# Patient Record
Sex: Male | Born: 1950
Health system: Southern US, Community
[De-identification: ages and names within clinical notes are randomized; demographics above are authoritative.]

## PROBLEM LIST (undated history)

## (undated) DIAGNOSIS — I2510 Atherosclerotic heart disease of native coronary artery without angina pectoris: Secondary | ICD-10-CM

## (undated) DIAGNOSIS — R0600 Dyspnea, unspecified: Secondary | ICD-10-CM

## (undated) DIAGNOSIS — I10 Essential (primary) hypertension: Secondary | ICD-10-CM

## (undated) DIAGNOSIS — T4145XA Adverse effect of unspecified anesthetic, initial encounter: Secondary | ICD-10-CM

## (undated) DIAGNOSIS — I639 Cerebral infarction, unspecified: Secondary | ICD-10-CM

## (undated) DIAGNOSIS — C61 Malignant neoplasm of prostate: Secondary | ICD-10-CM

## (undated) DIAGNOSIS — T8859XA Other complications of anesthesia, initial encounter: Secondary | ICD-10-CM

## (undated) DIAGNOSIS — G459 Transient cerebral ischemic attack, unspecified: Secondary | ICD-10-CM

## (undated) DIAGNOSIS — Z95820 Peripheral vascular angioplasty status with implants and grafts: Secondary | ICD-10-CM

## (undated) DIAGNOSIS — G458 Other transient cerebral ischemic attacks and related syndromes: Secondary | ICD-10-CM

## (undated) DIAGNOSIS — C029 Malignant neoplasm of tongue, unspecified: Secondary | ICD-10-CM

## (undated) DIAGNOSIS — B199 Unspecified viral hepatitis without hepatic coma: Secondary | ICD-10-CM

## (undated) DIAGNOSIS — F419 Anxiety disorder, unspecified: Secondary | ICD-10-CM

## (undated) DIAGNOSIS — K219 Gastro-esophageal reflux disease without esophagitis: Secondary | ICD-10-CM

## (undated) DIAGNOSIS — J449 Chronic obstructive pulmonary disease, unspecified: Secondary | ICD-10-CM

## (undated) DIAGNOSIS — F329 Major depressive disorder, single episode, unspecified: Secondary | ICD-10-CM

## (undated) DIAGNOSIS — E785 Hyperlipidemia, unspecified: Secondary | ICD-10-CM

## (undated) DIAGNOSIS — F909 Attention-deficit hyperactivity disorder, unspecified type: Secondary | ICD-10-CM

## (undated) DIAGNOSIS — M1990 Unspecified osteoarthritis, unspecified site: Secondary | ICD-10-CM

## (undated) DIAGNOSIS — G43909 Migraine, unspecified, not intractable, without status migrainosus: Secondary | ICD-10-CM

## (undated) DIAGNOSIS — E039 Hypothyroidism, unspecified: Secondary | ICD-10-CM

## (undated) DIAGNOSIS — I6529 Occlusion and stenosis of unspecified carotid artery: Secondary | ICD-10-CM

## (undated) DIAGNOSIS — I739 Peripheral vascular disease, unspecified: Secondary | ICD-10-CM

## (undated) HISTORY — PX: BIOPSY TONGUE: PRO39

## (undated) HISTORY — DX: Occlusion and stenosis of unspecified carotid artery: I65.29

## (undated) HISTORY — DX: Essential (primary) hypertension: I10

## (undated) HISTORY — DX: Atherosclerotic heart disease of native coronary artery without angina pectoris: I25.10

## (undated) HISTORY — PX: BACK SURGERY: SHX140

## (undated) HISTORY — DX: Other transient cerebral ischemic attacks and related syndromes: G45.8

## (undated) HISTORY — PX: CORONARY ANGIOPLASTY WITH STENT PLACEMENT: SHX49

## (undated) HISTORY — DX: Malignant neoplasm of tongue, unspecified: C02.9

## (undated) HISTORY — DX: Hyperlipidemia, unspecified: E78.5

## (undated) HISTORY — DX: Chronic obstructive pulmonary disease, unspecified: J44.9

---
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
## 1998-10-01 HISTORY — PX: CERVICAL DISC SURGERY: SHX588

## 2000-03-15 ENCOUNTER — Ambulatory Visit (HOSPITAL_COMMUNITY): Admission: RE | Admit: 2000-03-15 | Discharge: 2000-03-15 | Payer: Self-pay | Admitting: Gastroenterology

## 2000-03-15 ENCOUNTER — Emergency Department (HOSPITAL_COMMUNITY): Admission: EM | Admit: 2000-03-15 | Discharge: 2000-03-15 | Payer: Self-pay | Admitting: Emergency Medicine

## 2000-07-19 ENCOUNTER — Encounter: Payer: Self-pay | Admitting: Neurosurgery

## 2000-07-23 ENCOUNTER — Encounter: Payer: Self-pay | Admitting: Neurosurgery

## 2000-07-24 ENCOUNTER — Inpatient Hospital Stay (HOSPITAL_COMMUNITY): Admission: RE | Admit: 2000-07-24 | Discharge: 2000-07-25 | Payer: Self-pay | Admitting: Neurosurgery

## 2001-06-13 ENCOUNTER — Ambulatory Visit (HOSPITAL_COMMUNITY): Admission: RE | Admit: 2001-06-13 | Discharge: 2001-06-13 | Payer: Self-pay | Admitting: Neurosurgery

## 2001-06-13 ENCOUNTER — Encounter: Payer: Self-pay | Admitting: Neurosurgery

## 2003-10-02 DIAGNOSIS — G459 Transient cerebral ischemic attack, unspecified: Secondary | ICD-10-CM

## 2003-10-02 HISTORY — DX: Transient cerebral ischemic attack, unspecified: G45.9

## 2003-12-22 ENCOUNTER — Ambulatory Visit (HOSPITAL_COMMUNITY): Admission: RE | Admit: 2003-12-22 | Discharge: 2003-12-22 | Payer: Self-pay | Admitting: *Deleted

## 2004-01-06 ENCOUNTER — Encounter: Admission: EM | Admit: 2004-01-06 | Discharge: 2004-01-06 | Payer: Self-pay | Admitting: Dentistry

## 2004-03-29 ENCOUNTER — Encounter: Payer: Self-pay | Admitting: Emergency Medicine

## 2004-03-29 ENCOUNTER — Inpatient Hospital Stay (HOSPITAL_COMMUNITY): Admission: EM | Admit: 2004-03-29 | Discharge: 2004-03-31 | Payer: Self-pay | Admitting: Neurology

## 2004-03-30 ENCOUNTER — Encounter (INDEPENDENT_AMBULATORY_CARE_PROVIDER_SITE_OTHER): Payer: Self-pay | Admitting: Cardiology

## 2005-01-09 ENCOUNTER — Ambulatory Visit: Payer: Self-pay | Admitting: Dentistry

## 2005-06-29 ENCOUNTER — Ambulatory Visit (HOSPITAL_COMMUNITY): Admission: RE | Admit: 2005-06-29 | Discharge: 2005-06-29 | Payer: Self-pay | Admitting: Gastroenterology

## 2005-10-25 ENCOUNTER — Ambulatory Visit: Admission: RE | Admit: 2005-10-25 | Discharge: 2005-11-08 | Payer: Self-pay | Admitting: Radiation Oncology

## 2005-10-29 ENCOUNTER — Ambulatory Visit (HOSPITAL_COMMUNITY): Admission: RE | Admit: 2005-10-29 | Discharge: 2005-10-29 | Payer: Self-pay | Admitting: Gastroenterology

## 2005-11-02 ENCOUNTER — Ambulatory Visit: Admission: RE | Admit: 2005-11-02 | Discharge: 2005-11-02 | Payer: Self-pay | Admitting: Radiation Oncology

## 2005-11-30 ENCOUNTER — Ambulatory Visit: Payer: Self-pay | Admitting: Dentistry

## 2006-03-21 ENCOUNTER — Ambulatory Visit (HOSPITAL_COMMUNITY): Admission: RE | Admit: 2006-03-21 | Discharge: 2006-03-21 | Payer: Self-pay | Admitting: Radiation Oncology

## 2006-03-22 ENCOUNTER — Ambulatory Visit: Admission: RE | Admit: 2006-03-22 | Discharge: 2006-03-27 | Payer: Self-pay | Admitting: Radiation Oncology

## 2006-03-22 ENCOUNTER — Ambulatory Visit (HOSPITAL_COMMUNITY): Admission: RE | Admit: 2006-03-22 | Discharge: 2006-03-22 | Payer: Self-pay | Admitting: Radiation Oncology

## 2006-03-22 LAB — BASIC METABOLIC PANEL
BUN: 9 mg/dL (ref 6–23)
CO2: 30 mEq/L (ref 19–32)
Calcium: 9.5 mg/dL (ref 8.4–10.5)
Chloride: 99 mEq/L (ref 96–112)
Creatinine, Ser: 1.1 mg/dL (ref 0.40–1.50)
Glucose, Bld: 115 mg/dL — ABNORMAL HIGH (ref 70–99)
Potassium: 4 mEq/L (ref 3.5–5.3)
Sodium: 135 mEq/L (ref 135–145)

## 2006-06-21 ENCOUNTER — Ambulatory Visit: Payer: Self-pay | Admitting: Dentistry

## 2006-12-20 ENCOUNTER — Ambulatory Visit: Payer: Self-pay | Admitting: Dentistry

## 2006-12-26 ENCOUNTER — Ambulatory Visit: Payer: Self-pay | Admitting: *Deleted

## 2007-07-18 ENCOUNTER — Ambulatory Visit: Payer: Self-pay | Admitting: Dentistry

## 2007-10-02 HISTORY — PX: CORONARY ARTERY BYPASS GRAFT: SHX141

## 2007-12-25 ENCOUNTER — Ambulatory Visit: Payer: Self-pay | Admitting: *Deleted

## 2008-01-22 ENCOUNTER — Ambulatory Visit: Payer: Self-pay | Admitting: *Deleted

## 2008-02-11 ENCOUNTER — Ambulatory Visit (HOSPITAL_COMMUNITY): Admission: RE | Admit: 2008-02-11 | Discharge: 2008-02-11 | Payer: Self-pay | Admitting: Neurology

## 2008-03-11 ENCOUNTER — Ambulatory Visit: Payer: Self-pay | Admitting: *Deleted

## 2008-04-16 DIAGNOSIS — I10 Essential (primary) hypertension: Secondary | ICD-10-CM

## 2008-04-16 HISTORY — DX: Essential (primary) hypertension: I10

## 2008-04-21 ENCOUNTER — Encounter: Admission: RE | Admit: 2008-04-21 | Discharge: 2008-04-21 | Payer: Self-pay | Admitting: Cardiovascular Disease

## 2008-04-26 ENCOUNTER — Ambulatory Visit (HOSPITAL_COMMUNITY): Admission: RE | Admit: 2008-04-26 | Discharge: 2008-04-26 | Payer: Self-pay | Admitting: Cardiovascular Disease

## 2008-04-26 ENCOUNTER — Ambulatory Visit: Payer: Self-pay | Admitting: Thoracic Surgery (Cardiothoracic Vascular Surgery)

## 2008-04-26 ENCOUNTER — Encounter: Payer: Self-pay | Admitting: Thoracic Surgery (Cardiothoracic Vascular Surgery)

## 2008-04-26 HISTORY — PX: CARDIAC CATHETERIZATION: SHX172

## 2008-04-29 ENCOUNTER — Ambulatory Visit: Payer: Self-pay | Admitting: *Deleted

## 2008-05-05 ENCOUNTER — Ambulatory Visit (HOSPITAL_COMMUNITY): Admission: RE | Admit: 2008-05-05 | Discharge: 2008-05-05 | Payer: Self-pay | Admitting: *Deleted

## 2008-06-08 ENCOUNTER — Ambulatory Visit: Payer: Self-pay | Admitting: Thoracic Surgery (Cardiothoracic Vascular Surgery)

## 2008-06-14 ENCOUNTER — Ambulatory Visit (HOSPITAL_COMMUNITY)
Admission: RE | Admit: 2008-06-14 | Discharge: 2008-06-14 | Payer: Self-pay | Admitting: Thoracic Surgery (Cardiothoracic Vascular Surgery)

## 2008-06-14 ENCOUNTER — Encounter: Payer: Self-pay | Admitting: Thoracic Surgery (Cardiothoracic Vascular Surgery)

## 2008-06-16 ENCOUNTER — Inpatient Hospital Stay (HOSPITAL_COMMUNITY)
Admission: RE | Admit: 2008-06-16 | Discharge: 2008-06-20 | Payer: Self-pay | Admitting: Thoracic Surgery (Cardiothoracic Vascular Surgery)

## 2008-06-16 ENCOUNTER — Ambulatory Visit: Payer: Self-pay | Admitting: Thoracic Surgery (Cardiothoracic Vascular Surgery)

## 2008-06-24 ENCOUNTER — Ambulatory Visit: Payer: Self-pay | Admitting: Thoracic Surgery (Cardiothoracic Vascular Surgery)

## 2008-06-24 ENCOUNTER — Encounter
Admission: RE | Admit: 2008-06-24 | Discharge: 2008-06-24 | Payer: Self-pay | Admitting: Thoracic Surgery (Cardiothoracic Vascular Surgery)

## 2008-06-28 ENCOUNTER — Inpatient Hospital Stay (HOSPITAL_COMMUNITY)
Admission: AD | Admit: 2008-06-28 | Discharge: 2008-06-30 | Payer: Self-pay | Admitting: Thoracic Surgery (Cardiothoracic Vascular Surgery)

## 2008-06-28 ENCOUNTER — Ambulatory Visit: Payer: Self-pay | Admitting: Thoracic Surgery (Cardiothoracic Vascular Surgery)

## 2008-06-29 ENCOUNTER — Encounter (INDEPENDENT_AMBULATORY_CARE_PROVIDER_SITE_OTHER): Payer: Self-pay | Admitting: Gastroenterology

## 2008-07-08 ENCOUNTER — Encounter: Admission: RE | Admit: 2008-07-08 | Discharge: 2008-07-08 | Payer: Self-pay | Admitting: Gastroenterology

## 2008-07-08 ENCOUNTER — Ambulatory Visit: Payer: Self-pay | Admitting: Thoracic Surgery (Cardiothoracic Vascular Surgery)

## 2008-07-15 ENCOUNTER — Ambulatory Visit: Payer: Self-pay | Admitting: Thoracic Surgery (Cardiothoracic Vascular Surgery)

## 2008-07-15 ENCOUNTER — Encounter
Admission: RE | Admit: 2008-07-15 | Discharge: 2008-07-15 | Payer: Self-pay | Admitting: Thoracic Surgery (Cardiothoracic Vascular Surgery)

## 2008-07-26 ENCOUNTER — Ambulatory Visit (HOSPITAL_COMMUNITY): Admission: RE | Admit: 2008-07-26 | Discharge: 2008-07-26 | Payer: Self-pay | Admitting: Otolaryngology

## 2008-08-06 ENCOUNTER — Ambulatory Visit: Payer: Self-pay | Admitting: Thoracic Surgery (Cardiothoracic Vascular Surgery)

## 2008-08-19 ENCOUNTER — Encounter (HOSPITAL_COMMUNITY): Admission: RE | Admit: 2008-08-19 | Discharge: 2008-09-17 | Payer: Self-pay | Admitting: Cardiovascular Disease

## 2008-11-04 ENCOUNTER — Ambulatory Visit: Payer: Self-pay | Admitting: *Deleted

## 2009-05-04 ENCOUNTER — Encounter: Admission: RE | Admit: 2009-05-04 | Discharge: 2009-05-04 | Payer: Self-pay | Admitting: Emergency Medicine

## 2009-05-13 ENCOUNTER — Encounter: Admission: RE | Admit: 2009-05-13 | Discharge: 2009-05-13 | Payer: Self-pay | Admitting: Emergency Medicine

## 2009-05-26 ENCOUNTER — Ambulatory Visit: Payer: Self-pay | Admitting: *Deleted

## 2009-11-02 ENCOUNTER — Ambulatory Visit: Payer: Self-pay | Admitting: Vascular Surgery

## 2009-12-19 ENCOUNTER — Ambulatory Visit: Payer: Self-pay | Admitting: Internal Medicine

## 2010-01-02 ENCOUNTER — Emergency Department (HOSPITAL_COMMUNITY): Admission: EM | Admit: 2010-01-02 | Discharge: 2010-01-02 | Payer: Self-pay | Admitting: Emergency Medicine

## 2010-01-13 ENCOUNTER — Ambulatory Visit: Payer: Self-pay | Admitting: Internal Medicine

## 2010-06-13 ENCOUNTER — Ambulatory Visit: Payer: Self-pay | Admitting: Vascular Surgery

## 2010-07-25 ENCOUNTER — Ambulatory Visit: Payer: Self-pay | Admitting: Internal Medicine

## 2010-12-20 LAB — BASIC METABOLIC PANEL
BUN: 13 mg/dL (ref 6–23)
CO2: 25 mEq/L (ref 19–32)
Chloride: 106 mEq/L (ref 96–112)
Glucose, Bld: 130 mg/dL — ABNORMAL HIGH (ref 70–99)
Potassium: 3.6 mEq/L (ref 3.5–5.1)
Sodium: 138 mEq/L (ref 135–145)

## 2010-12-20 LAB — CBC
HCT: 45.9 % (ref 39.0–52.0)
Hemoglobin: 16 g/dL (ref 13.0–17.0)
MCV: 91.7 fL (ref 78.0–100.0)
Platelets: 190 10*3/uL (ref 150–400)
RDW: 14 % (ref 11.5–15.5)

## 2010-12-20 LAB — DIFFERENTIAL
Basophils Relative: 1 % (ref 0–1)
Monocytes Absolute: 0.4 10*3/uL (ref 0.1–1.0)

## 2010-12-25 ENCOUNTER — Encounter (INDEPENDENT_AMBULATORY_CARE_PROVIDER_SITE_OTHER): Payer: Commercial Managed Care - PPO | Admitting: Internal Medicine

## 2010-12-25 DIAGNOSIS — E785 Hyperlipidemia, unspecified: Secondary | ICD-10-CM

## 2010-12-25 DIAGNOSIS — Z23 Encounter for immunization: Secondary | ICD-10-CM

## 2010-12-25 DIAGNOSIS — F411 Generalized anxiety disorder: Secondary | ICD-10-CM

## 2010-12-25 DIAGNOSIS — I10 Essential (primary) hypertension: Secondary | ICD-10-CM

## 2010-12-25 DIAGNOSIS — F329 Major depressive disorder, single episode, unspecified: Secondary | ICD-10-CM

## 2011-01-01 ENCOUNTER — Other Ambulatory Visit: Payer: Commercial Managed Care - PPO | Admitting: Internal Medicine

## 2011-01-12 ENCOUNTER — Ambulatory Visit: Payer: Self-pay

## 2011-01-12 ENCOUNTER — Other Ambulatory Visit: Payer: Self-pay

## 2011-01-18 ENCOUNTER — Other Ambulatory Visit (INDEPENDENT_AMBULATORY_CARE_PROVIDER_SITE_OTHER): Payer: Commercial Managed Care - PPO

## 2011-01-18 ENCOUNTER — Ambulatory Visit (INDEPENDENT_AMBULATORY_CARE_PROVIDER_SITE_OTHER): Payer: Commercial Managed Care - PPO

## 2011-01-18 DIAGNOSIS — I6529 Occlusion and stenosis of unspecified carotid artery: Secondary | ICD-10-CM

## 2011-01-18 NOTE — Assessment & Plan Note (Signed)
OFFICE VISIT  [name], [name] DOB:  02/20/1951                                       01/18/2011 [id number]#:[id number]  CHIEF COMPLAINT:  Follow up carotid stenosis.  Patient is a 60 year old gentleman with known coronary artery disease, status post CABG, hypercholesterolemia, hypertension, and known right subclavian artery stenosis as well as right external carotid artery occlusion who returns today for a 38-month visit for carotid duplex scan. The patient denies any symptoms of TIA, CVA, amaurosis, or dysarthria. He is left-hand dominant.  The patient states there is a positive family history of his mother needing carotid artery surgery for high-grade stenosis.  PHYSICAL EXAMINATION:  A well-developed, well-nourished gentleman in no acute distress.  He has good and equal strength in bilateral upper and lower extremities.  He has no facial droop and no tongue deviation.  Vascular studies shows right subclavian velocity of 622 cm/s.  He has a 40% to 59% right internal carotid artery stenosis with peak systolic velocity of 220 and end-diastolic velocity of 52.  His right external carotid artery is occluded.  The left external carotid peak systolic pressure is near-occlusive at 414 cm/s.  The ICA peak systolic and end diastolic on the left in the proximal portion are 113/29 and 165/49 in the mid portion, showing mild plaque.  IMPRESSION: 1. 40% to 59% right internal carotid artery stenosis with a right     external carotid occlusion.  He also has a high grade right     subclavian stenosis with a brachial systolic pressure at 142 and     left brachial systolic pressure of 190. 2. He is a 1%  to 39% left internal carotid artery stenosis with     elevated velocities in the mid segment where tortuosity is     observed.  ASSESSMENT: 1. Unchanged right subclavian stenosis at its origin. 2. 40% to 59% right internal carotid artery stenosis with occluded  external carotid artery on the right.  Tortuosity of the left     internal carotid artery with downgraded stenosis to 1% to 39%.  On     previous studies 6 months ago in September 2011, the left internal     carotid artery had a peak systolic of 217 and end-diastolic of 70,     but this may have been due to tortuosity, and it was read as a 69%     to 79% stenosis of the left proximal internal carotid artery with     moderate plaque.  PLAN:  The plan is to have the patient come back in 6 months for follow- up studies.  The patient is aware of signs and symptoms of stroke and will contact us if he has any symptoms.  Della Goo, PA-C  Charles E. Fields, MD Electronically Signed  RR/MEDQ  D:  01/18/2011  T:  01/18/2011  Job:  [id number]

## 2011-01-23 NOTE — Procedures (Unsigned)
CAROTID DUPLEX EXAM  INDICATION:  Follow up carotid disease.  HISTORY: Diabetes:  No. Cardiac:  Yes. Hypertension:  Yes. Smoking:  Previous. Previous Surgery:  No. CV History: Amaurosis Fugax No, Paresthesias No, Hemiparesis No.                                      RIGHT             LEFT Brachial systolic pressure:         142               190 Brachial Doppler waveforms:         Weak              WNL Vertebral direction of flow:        Abnormal antegrade                  Antegrade DUPLEX VELOCITIES (cm/sec) CCA peak systolic                   74                145 ECA peak systolic                   Occluded          414 ICA peak systolic                   220               113 (P), 165 (M) ICA end diastolic                   52                29 (P), 49 (M) PLAQUE MORPHOLOGY:                  Heterogenous      Heterogenous PLAQUE AMOUNT:                      Moderate          Mild PLAQUE LOCATION:                    CCA/ECA/ICA       CCA/ECA/ICA  IMPRESSION: 1. Right subclavian velocities 622 cm/s with significant stenosis     observed. 2. 40% to 59% right internal carotid artery. 3. 1% to 39% left internal carotid artery stenosis with elevated     velocities in the mid segment where tortuosity is observed.     Previous studies reported this as 60% to 79%; however, today's     findings were verified by a second technologist. 4. Right vertebral artery is abnormal with severe stenosis at the     origin of the right subclavian artery. 5. Right external carotid artery is occluded. 6. Normal left vertebral artery. 7. Significant stenosis of the left external carotid artery.  ___________________________________________ Larina Earthly, M.D.  LT/MEDQ  D:  01/18/2011  T:  01/18/2011  Job:  [id number]

## 2011-02-13 NOTE — H&P (Signed)
HISTORY AND PHYSICAL EXAMINATION   June 28, 2008   Re:  [name], [name]         DOB:  10/15/1950   CHIEF COMPLAINT:  Painful swallowing and dizziness.   HISTORY OF PRESENT ILLNESS:  The patient is a 60 year old gentleman, who  recently had coronary artery bypass grafting x5 on June 16, 2008.  He had originally presented for evaluation of her right internal carotid  stenosis.  Because of previous neck radiation for squamous cell  carcinoma of the tongue, the patient was not felt to be a candidate for  open carotid surgery.  During his workup, he had a positive Myoview  test, which showed inferior ischemia.  In catheterization, he had severe  three-vessel coronary artery disease.  As he turned out on carotid  angiography, he did not have significant carotid artery disease and  therefore, a stent was not needed.  Because of his positive Myoview and  three-vessel disease, the patient was advised to undergo coronary artery  bypass grafting and he did so on June 16, 2008.  Postoperatively,  his course was relatively uncomplicated.  He was seen back in the office  last week with complaints of a fever to 101.  He had been started on  Keflex empirically as an outpatient.  He had some drainage from the  chest tube site.  He subsequently came to the office on June 24, 2008, with complaints of painful swallowing and on exam, had thrush.  He  was started on Diflucan in addition to the Keflex.  Since then, the  patient has continued to feel poorly.  Today, he says he feels weak.  He  denies any fevers or chills, but has general malaise, nausea, vomiting,  and inability to take p.o.'s because of sore throat.  He does get dizzy  when he sits or stands quickly.   PAST MEDICAL HISTORY:  His past medical history is consistent for recent  coronary artery bypass grafting for three-vessel coronary artery  disease; a question of carotid stenosis, which was ruled  out by  angiography; squamous cell carcinoma of the tongue, status post  radiation; hypertension; dyslipidemia; and history of tobacco abuse.   CURRENT MEDICATIONS:  Amlodipine 10 mg daily,  lisinopril/hydrochlorothiazide 20/25 daily, pilocarpine 5 mg 4 times  daily as needed, Adderall 30 mg t.i.d., Cymbalta 60 mg daily, bupropion  SR 200 mg b.i.d., enteric-coated aspirin 81 mg daily, Diflucan 100 mg  daily, and Keflex 500 mg p.o. t.i.d.   ALLERGY:  Statins.   FAMILY HISTORY:  Significant for cardiovascular disease.   SOCIAL HISTORY:  A 40-pack-year history of smoking, quit 10 years ago.  He drinks 4 to 5 beers daily.   REVIEW OF SYSTEMS:  Odynophagia, dysphagia, sensation of food sticking  in his neck, nausea, vomiting.  No diarrhea.  Fevers 7 days ago, none  presently.  No chills or sweats.  Depressed and emotional.   PHYSICAL EXAMINATION:  GENERAL:  The patient is a 60 year old white  male.  He is in obvious discomfort.  He is very emotional and tearful.  VITAL SIGNS:  His blood pressure is 85/65, pulse 86, respirations are  18, his oxygen saturation is 99%, and his temperature is 97.7 axillary.  He refused oral temperature.  NEUROLOGIC:  He is nonfocal.  HEENT:  Dry and red, irritation of the tongue.  LUNGS:  Clear with equal breath sounds bilaterally.  CHEST:  Sternal incision is clean, dry, and intact.  Chest tube sites  are healing well.  The sternum is stable.  ABDOMEN:  Nontender.  EXTREMITIES:  Decreased skin turgor.  There is no edema.  His leg wounds  are healing well.   IMPRESSION:  The patient is a 60 year old gentleman.  He is now about 2  weeks out from coronary artery bypass grafting.  Initially, his  postoperative course was uncomplicated, however, last week, he developed  some difficulty swallowing.  He was treated for probable thrush with  Diflucan, however, he continues to have both odynophagia and now  dysphagia and just feels generally poor.  He is not  able to hold down  even liquids and therefore, he needs admission.  He is dehydrated.  We  will plan to admit him for rehydration with intravenous fluids.  We will  start him on a clear liquid diet.  I am going to start him on Magic  Mouthwash.  I am going to stop the Diflucan and Keflex at this point.  He has been seen by Dr. [name] in the past for gastrointestinal and  swallowing complaints following his neck radiation.  We will ask her to  see  him and see if there are any further testing is warranted at this point  in time.  We will also notify Dr. Nanetta Batty of his readmission.   [name], M.D.  Electronically Signed   SCH/MEDQ  D:  06/28/2008  T:  06/28/2008  Job:  [id number]   cc:   Nanetta Batty, M.D.  Anselmo Rod, M.D.  Jonita Albee, M.D.

## 2011-02-13 NOTE — Procedures (Signed)
CAROTID DUPLEX EXAM   INDICATION:  Follow up, right carotid disease.   HISTORY:  Diabetes:  No.  Cardiac:  No.  Hypertension:  Yes.  Smoking:  No.  Previous Surgery:  No.  CV History:  History of transient left-sided facial numbness.  Amaurosis Fugax No, Paresthesias No, Hemiparesis No.                                       RIGHT              LEFT  Brachial systolic pressure:         110                140  Brachial Doppler waveforms:         Abnormal           Normal  Vertebral direction of flow:        Antegrade/dampened  DUPLEX VELOCITIES (cm/sec)  CCA peak systolic                   108  ECA peak systolic                   Occluded  ICA peak systolic                    221 (mid)  ICA end diastolic                   62  PLAQUE MORPHOLOGY:                  Heterogenous  PLAQUE AMOUNT:                      Mild/moderate  PLAQUE LOCATION:                    Proximal ICA/proximal ECA   IMPRESSION:  1. Doppler velocities suggest a low-end 60-79% stenosis of the right      internal carotid artery; however, these velocities may also be      elevated by vessel tortuosity.  2. The proximal segment of the right external carotid artery appears      occluded with collateral circulation noted near the mid segment.  3. Significant stenosis of the right subclavian artery is seen with      Doppler velocity of 305 cm/s.  4. Velocities within the right internal carotid artery appear less      than previously recorded on 12/25/07.      ___________________________________________  P. Liliane Bade, M.D.   CH/MEDQ  D:  04/29/2008  T:  04/29/2008  Job:  [id number]

## 2011-02-13 NOTE — Procedures (Signed)
CAROTID DUPLEX EXAM   INDICATION:  Carotid disease.   HISTORY:  Diabetes:  No.  Cardiac:  Yes.  Hypertension:  Yes.  Smoking:  No.  Previous Surgery:  No.  CV History:  Currently asymptomatic.  Amaurosis Fugax No, Paresthesias No, Hemiparesis No                                       RIGHT             LEFT  Brachial systolic pressure:         140               160  Brachial Doppler waveforms:         Mildly dampened   Within normal  limits  Vertebral direction of flow:        Antegrade         Antegrade  DUPLEX VELOCITIES (cm/sec)  CCA peak systolic                   68                171  ECA peak systolic                   Occluded          640  ICA peak systolic                   186               217  ICA end diastolic                   65                70  PLAQUE MORPHOLOGY:                  Mixed             Mixed  PLAQUE AMOUNT:                      Moderate          Moderate  PLAQUE LOCATION:                    ICA / ECA / CCA   ICA / ECA / CCA   IMPRESSION:  1. Doppler velocity suggests 40%-59% stenosis of the right proximal      internal carotid artery and a 69%-79% stenosis of the left proximal      internal carotid artery.  2. The right external carotid artery appears to be occluded.  3. There is an increased velocity of 371 cm/second noted in the right      proximal subclavian artery with a significant difference in      brachial pressures.  4. No significant change in the bilateral carotid arteries when      compared to the previous exam on 11/02/2009.   ___________________________________________  Larina Earthly, M.D.   CH/MEDQ  D:  06/14/2010  T:  06/14/2010  Job:  [id number]

## 2011-02-13 NOTE — Op Note (Signed)
NAMEJAYVON, [name]               ACCOUNT NO.:  [id number]   MEDICAL RECORD NO.:  [id number]          PATIENT TYPE:  INP   LOCATION:  2312                         FACILITY:  [facility]   PHYSICIAN:  Salvatore Decent. Dorris Fetch, M.D.DATE OF BIRTH:  1950-12-16   DATE OF PROCEDURE:  06/16/2008  DATE OF DISCHARGE:                               OPERATIVE REPORT   PREOPERATIVE DIAGNOSIS:  Three-vessel coronary disease with positive  stress Myoview.   POSTOPERATIVE DIAGNOSIS:  Three-vessel coronary disease with positive  stress Myoview.   PROCEDURE:  Median sternotomy, extracorporeal circulation, free left  internal mammary artery to left anterior descending, sequential  saphenous vein graft to first diagonal, first obtuse marginal and second  obtuse marginal, saphenous vein graft to posterior descending,  endoscopic vein harvest right leg.   SURGEON:  Salvatore Decent. Dorris Fetch, MD   ASSISTANTS:  Doree Fudge, PA   ANESTHESIA:  General.   FINDINGS:  Saphenous vein good-quality, bifurcated below-knee, mammary  artery good-quality, targets good quality.   CLINICAL NOTE:  [name] is a 60 year old gentleman who is being  evaluated for possible right high-grade internal carotid stenosis.  During his evaluation, he had a cardiac workup which included a stress  Myoview which was positive for inferior ischemia.  Dr. Nanetta Batty  performed cardiac catheterization where he was found to have an occluded  right coronary type bifurcation lesion in his circumflex.  There is an  80% stenosis in the first diagonal and moderate disease in the LAD  including in one view likely possible 50% ostial stenosis.  The patient  was referred for coronary artery bypass grafting.  The initial plan was  to proceed with stenting of his right internal carotid followed by  interval coronary bypass grafting.  The patient underwent carotid  angiography.  He was found to have no significant internal carotid  stenosis.   The patient subsequently was referred back for coronary  bypass grafting.  The indications, risks, benefits, and alternatives  were discussed in detail with the patient.  He understood and accepted  the risks and agreed to proceed.   OPERATIVE NOTE:  [name] was brought to the preop holding area on  June 16, 2008.  There the anesthesia service under the direction of  Dr. Kipp Brood placed a Swan-Ganz catheter and an arterial blood  pressure monitoring catheter.  Intravenous access was established.  Intravenous antibiotics were administered.  The patient was taken to the  operating room, anesthetized, and intubated.  A Foley catheter was  placed.  The chest, abdomen, and legs were prepped and draped in usual  fashion.  An incision was made in the medial aspect of the right leg at  the level of the greater saphenous vein was identified.  It was  harvested endoscopically from midcalf to groin.  Simultaneously, a  median sternotomy was performed and the left internal mammary artery was  harvested using standard technique.  Preoperative angiography at the  time of catheterization revealed a tight proximal internal mammary  stenosis.  Therefore, the vessel was divided distally after assuring all  branches had been clipped.  It was divided proximally and the proximal  stump was suture-ligated with a 2-0 silk suture.  Heparin 2000 units was  administered during the vessel harvest.  On inspection of the vein below  the knee, the vein had bifurcated and the vessel became too small to  utilize.  Exploration of the second limb via bridged open incisions  revealed a relatively small vein.  This was harvested but subsequently  not utilized.   The remainder of the full heparin dose was given.  The pericardium was  opened.  The ascending aorta was inspected.  There was no evidence of  atherosclerotic disease.  The aorta was cannulated via concentric 2  Ethibond pledgeted purse-string sutures.   A dual-stage venous cannula  was placed via purse-string suture in the right atrial appendage.  Cardiopulmonary bypass was instituted and the patient was cooled to 32  degrees Celsius.  Coronary arteries were inspected and anastomotic sites  were chosen.  The conduits were inspected and cut to length.  A foam pad  was placed in the pericardium to protect the left phrenic nerve.  A  temperature probe was placed in the myocardial septum and a cardioplegic  cannula was placed in the ascending aorta.   The aorta was cross-clamped.  The left ventricle was emptied via the  aortic root vent.  Cardiac arrest was then achieved with a combination  of cold antegrade blood cardioplegia and topical iced saline.  Septal  cooling was initially rather sluggish.  There was a rapid diastolic  arrest.  A 1200 mL of cardioplegia was administered and myocardial  septal temperature fell to 10 degrees Celsius.  The following distal  anastomoses were performed.   First a reversed saphenous vein graft was placed end-to-side to the  posterior descending branch of right coronary.  The right coronary was  totally occluded.  The posterior descending was a 1.5 mm good-quality  target vessel.  It was anastomosed end-to-side with a running 7-0  Prolene suture.  At the completion of each vein graft, cardioplegia was  administered.  There was good flow and good hemostasis.   Next, a reversed saphenous vein graft was placed sequentially to the  first diagonal which was a high anterolateral branch of the LAD and had  an 80% stenosis.  The side-to-side anastomosis was performed with a  running 7-0 Prolene suture.  Each of these anastomoses were probed  proximally and distally at their completion to ensure patency.  Next,  the vein was anastomosed side-to-side to the first obtuse marginal.  This was a 1.5 mm good-quality target.  Finally, an end-to-side  anastomosis was performed to obtuse marginal 2 which was likewise a  1.5  mm good-quality target.  Cardioplegia was administered.  There was good  flow and good hemostasis at the anastomoses.   Next, the distal end of the free left internal mammary artery was  beveled.  It was anastomosed end-to-side to the distal LAD.  The LAD was  a 1.5 mm good-quality target.  The mammary was a 1.5 mm good-quality  conduit.  The anastomosis was performed with a running 8-0 Prolene  suture.   Additional cardioplegia was administered.  There was good backbleeding  from the mammary artery.  A bulldog clamp was placed across the vessel  after de-airing.  The vein grafts were cut to length.  The cardioplegic  cannula was then removed from the ascending aorta and the proximal vein  graft anastomoses were performed to 4.5 mm punch aortotomies  with  running 6-0 Prolene sutures.  At completion of final vein graft proximal  anastomoses, the patient was placed in Trendelenburg position.  Lidocaine was administered.  The aortic root was de-aired.  The aortic  cross-clamp was removed.  The total cross-clamp time was 71 minutes.   After de-airing the vein grafts and inspecting the proximal anastomoses  for hemostasis, the free left mammary graft was cut to length.  The  bulldog clamps were placed proximally and distally on the sequential  vein near its origin.  A longitudinal venotomy was performed and the  proximal anastomosis for the free mammary graft was performed in an end-  to-side fashion to this vein graft with a running 7-0 Prolene suture.  The anastomosis was de-aired by removing the distal bulldogs.  The  proximal bulldog was then removed.  All proximal and distal anastomoses  were again inspected for hemostasis.  Epicardial pacing wires were  placed on the right ventricle and right atrium.  When the patient had  rewarmed to a core temperature of 37 degrees Celsius, he was weaned from  cardiopulmonary bypass on the first attempt.  Total bypass time was 121  minutes.   The initial cardiac index was greater than 2 L/min/m2 and the  patient remained hemodynamically stable throughout the post bypass  period.   A test dose protamine was administered and was well tolerated.  The  atrial and aortic cannulae were removed.  The remainder of the protamine  was administered without incident.  The chest irrigated was irrigated  with 1 L of warm normal saline containing 1 gram of vancomycin.  Hemostasis was achieved.  A left pleural and 2 mediastinal chest tubes  were placed through separate subcostal incisions.  The pericardium was  reapproximated over the aortic root and base of the heart with  interrupted 3-0 silk sutures.  Sternum was closed with a combination of  single and double heavy gauge stainless steel wires.  His pectoralis  fascia, subcutaneous tissue, and skin were closed in standard fashion.  All sponge, needle, and instrument counts were correct at the end of the  procedure.  There were no intraoperative complications.  The patient was  taken from the operating room to the Surgical Intensive Care Unit  intubated and in good condition.      Salvatore Decent Dorris Fetch, M.D.  Electronically Signed     SCH/MEDQ  D:  06/16/2008  T:  06/17/2008  Job:  [id number]   cc:   Nanetta Batty, M.D.  Balinda Quails, M.D.  Pramod P. Pearlean Brownie, MD

## 2011-02-13 NOTE — Procedures (Signed)
CAROTID DUPLEX EXAM   INDICATION:  Followup carotid artery disease, history of radiation of  neck.   HISTORY:  Diabetes:  No.  Cardiac:  Yes.  Hypertension:  Yes.  Smoking:  No.  Previous Surgery:  No.  CV History:  History of left side facial numbness in the past,  asymptomatic now.  Amaurosis Fugax No, Paresthesias No, Hemiparesis No                                       RIGHT             LEFT  Brachial systolic pressure:         140               164  Brachial Doppler waveforms:         WNL               WNL  Vertebral direction of flow:        Antegrade (atypical)                Antegrade  DUPLEX VELOCITIES (cm/sec)  CCA peak systolic                   87                161  ECA peak systolic                   Occluded          438  ICA peak systolic                   236               243  ICA end diastolic                   74                72  PLAQUE MORPHOLOGY:                  Heterogeneous     Heterogeneous  PLAQUE AMOUNT:                      Moderate          Moderate  PLAQUE LOCATION:                    ICA/ECA           ICA/ECA   IMPRESSION:  1. Right internal carotid artery shows evidence of 60-79% stenosis and      appears stable.  2. Left internal carotid artery shows evidence of 60-79% stenosis, an      increase from previous study.  3. Right proximal external carotid artery appears occluded with      collateral flow noted near mid / distal segment.  4. Left external carotid artery stenosis.  5. Bilateral internal carotid arteries appear tortuous.  6. Bilateral vertebral arteries appear antegrade, however, right is      atypical combined with brachial pressure differences consistent      with previously documented right subclavian artery disease.  7. Dr. Edilia Bo was informed of results and followup appointment      scheduled with Dr. Darrick Penna in 6 months at time of next duplex.   ___________________________________________  Janetta Hora  Fields, MD   AS/MEDQ  D:  05/26/2009  T:  05/26/2009  Job:  [id number]

## 2011-02-13 NOTE — Cardiovascular Report (Signed)
NAME:  [name], [name] NO.:  [id number]   MEDICAL RECORD NO.:  [id number]          PATIENT TYPE:  OIB   LOCATION:  3731                         FACILITY:  [facility]   PHYSICIAN:  Nanetta Batty, M.D.   DATE OF BIRTH:  1951/09/27   DATE OF PROCEDURE:  DATE OF DISCHARGE:  04/26/2008                            CARDIAC CATHETERIZATION   Mr. Sawa is a very pleasant 60 year old married white male, father of  one child, grandfather of 4 grown grandchildren, referred through  courtesy of Dr. Madilyn Fireman for cardiovascular evaluation and preoperative  clearance prior to elective right internal carotid artery stenting  because of hostile neck secondary to neck radiation from squamous cell  CA of his lung.  His other problems include hyperlipidemia and treated  hypertension.  He denied chest pain.  A Myoview showed inferior  ischemia.  He presents now for diagnostic coronary __________ to define  his anatomy and to stratify him.   DESCRIPTION OF PROCEDURE:  The patient was brought to the second floor  [place] cardiac cath lab in postabsorptive state.  He is premedicated  with p.o. Valium, IV Versed, and fentanyl.  His right groin was prepped  and shaved in the usual sterile fashion.  Xylocaine 1% was used for  local anesthesia.  A 6-French sheath was inserted into the right femoral  artery using standard Seldinger technique.  A 6-French  __________  diagnostic catheter as well as 6-French pigtail catheter used for  selective coronary angiography, left ventriculography, subselective  right and selective left internal mammary artery angiography, distal  abdominal aortography.  Visipaque dye was used for entirety of case.  Retrograde aorta__________ left ventricular pullback pressures were  recorded.   HEMODYNAMICS:  1. Aortic systolic pressure 106 and diastolic pressure 56.  2. Left ventricular systolic pressure 105 and diastolic pressure 10.   SELECTIVE CORONARY ANGIOGRAPHY:  1. Left main normal.  2. LAD; the LAD had 30% segmental proximal stenosis.  __________ off a      moderate size first diagonal branch which had an 80% segmental      proximal stenosis.  3. Left circumflex; 99% stenosis in the midportion straddling a      marginal branch.  4. Right coronary; total proximally with grade 2 left-to-right      collaterals.  5. Left ventriculography; RAO left ventriculogram was performed using      25 mL of Visipaque dye at 12 mL per second.  The overall LVEF was      estimated at greater than 60% with a mild inferobasal hypokinesia.  6. Distal abdominal aortography; performed using 20 mL of Visipaque      dye at 20 mL per second.  The renal arteries were widely patent.      Renal abdominal aorta and iliac bifurcation appeared free of      significant atherosclerotic changes.   Left internal mammary artery; this vessel was selectively visualized and  was found to have a 95% ostial stenosis.  The right internal mammary  artery was subselectively visualized and had 50-60% ostial/proximal  stenosis.   IMPRESSION:  Mr. Mcnab  has 3-vessel disease, preserved left ventricular  function, and positive Myoview.  His circumflex is technically difficult  to revascularize percutaneously given the fact that it crosses a  moderate-sized marginal branch which has some ostial disease.  Options  include coronary bypass grafting versus percutaneous coronary  intervention of the circumflex and diagonal branch.  I am going to  review the films with my colleagues prior to making recommendation.   The sheath was removed and pressures on the groin to achieve good  hemostasis.  The patient left the lab in stable condition.  He will  remain recumbent for 6 hours.  He will be admitted to Telemetry during  the decision process.      Nanetta Batty, M.D.  Electronically Signed     JB/MEDQ  D:  04/26/2008  T:  04/27/2008  Job:  [id number]   cc:   [facility]  Balinda Quails, M.D.  Jonita Albee, M.D.

## 2011-02-13 NOTE — Assessment & Plan Note (Signed)
OFFICE VISIT   [name], [name]  DOB:  Feb 13, 1951                                       03/11/2008  [id number]#:[id number]    The patient returned to the office today after undergoing an assessment  by Dr. Pearlean Brownie in the neurologic office.  He is in agreement that a right  carotid stent would be appropriate for this gentleman with a history of  irradiation to the neck and a severe right internal carotid artery  stenosis.   He will be entered into the SAPPHIRE study for carotid stenting.  He  will begin Plavix 75 mg daily in addition to his aspirin 81 mg daily.  His Plavix will begin 1 week prior to the procedure.   I have discussed the details of the procedure with a planned 24 hour  observation.  We also discussed the potential complications including  stroke.   Balinda Quails, M.D.  Electronically Signed   PGH/MEDQ  D:  03/11/2008  T:  03/12/2008  Job:  [id number]   cc:   Jonita Albee, M.D.  Pramod P. Pearlean Brownie, MD  Artist Pais Kathrynn Running, M.D.

## 2011-02-13 NOTE — Discharge Summary (Signed)
[name], [name]               ACCOUNT NO.:  [id number]   MEDICAL RECORD NO.:  [id number]          PATIENT TYPE:  INP   LOCATION:  2038                         FACILITY:  [facility]   PHYSICIAN:  Salvatore Decent. Dorris [name], M.D.DATE OF BIRTH:  20-Jun-1951   DATE OF ADMISSION:  06/16/2008  DATE OF DISCHARGE:  06/20/2008                               DISCHARGE SUMMARY   HISTORY:  The patient is a 60 year old gentleman referred to Gregory Spare C.  Dorris [name], M.D. for thoracic surgical consultation.  He recently was  found what was thought to have been a high-grade right carotid stenosis.  The patient has a history of squamous cell carcinoma of the tongue  treated with radiation therapy.  He was found to have on physical exam  asymptomatic carotid bruits.  A carotid Duplex revealed what appeared to  be a high-grade right internal carotid artery stenosis.  Because of his  previous radiation, he was not felt to be a good candidate for carotid  endarterectomy and was evaluated for stenting.  As a part of his  preoperative evaluation, he was sent for cardiac clearance.  Dr.  Nanetta Batty did a stress Myoview, which showed inferior ischemia.  On  cardiac catheterization, he was found to have a total occlusion of the  right coronary and a tight bifurcation lesion in the circumflex as well  as an 80% stenosis in the diagonal branch.  He did not have significant  LAD disease.  He did have a tight stenosis in the origin of his left  mammary as well as some moderate stenosis in the origin of his right  mammary.  It was felt that he would benefit most from a right carotid  stent followed by coronary artery bypass grafting.  He was taken to the  Peripheral Vascular Lab on May 05, 2008, where he was found to have  only 30-40% stenosis of the right internal carotid artery and right  external carotid was occluded.  He had a normal left internal carotid.  He did have some disease of the left external carotid.   Given that there  was no hemodynamically significant extracranial cerebrovascular  occlusive disease, the stenting was not performed.  He was taken off  Plavix and referred to Dr. Dorris [name] for a thoracic surgical opinion.  Dr. Dorris [name] evaluated the patient and his studies and agreed with  recommendations to proceed with surgical coronary revascularization.   PAST MEDICAL HISTORY:  Extracranial cerebrovascular occlusive disease as  described above.   OTHER DIAGNOSES:  1. Squamous cell carcinoma of the tongue status post radiation.  2. Hypertension.  3. Dyslipidemia.  4. Previous neck surgery.  5. History of tobacco abuse.   MEDICATIONS PRIOR TO ADMISSION:  1. Amlodipine 10 mg daily.  2. Lisinopril/hydrochlorothiazide 20/25 one tablet daily.  3. Pilocarpine 5 mg 4 times a day p.r.n.  4. Adderall 30 mg t.i.d.  5. Cymbalta 60 mg daily.  6. Bupropion 200 mg b.i.d.  7. Omeprazole 20 mg daily.  8. Niacin 500 mg daily.  9. Enzyme CoQ10 300 mg daily.  10.Aspirin 325 mg daily.   ALLERGIES:  Adverse reactions to STATINS, which cause muscle aching.   FAMILY HISTORY, SOCIAL HISTORY, REVIEW OF SYSTEMS, AND PHYSICAL  EXAMINATION:  Please see the history and physical done at the time of  admission.   HOSPITAL COURSE:  The patient was admitted and taken to the operating  room on June 16, 2008, where he underwent the following procedure.  Coronary artery bypass grafting x5.  The following grafts were placed.  1. Free left internal mammary artery to the left anterior descending.  2. A sequential saphenous vein graft to the first diagonal.  3. First obtuse marginal and second obtuse marginal.  4. Saphenous vein graft to the posterior descending coronary artery.      The patient tolerated the procedure well.  He was taken to the      surgical intensive care unit in stable condition.   POSTOPERATIVE HOSPITAL COURSE:  The patient has overall done quite well.  He has remained  hemodynamically stable.  All routine labs, monitors,  drains, devices have been discontinued in a standard fashion.  His  incisions are all healing well.  He has required aggressive pulmonary  toilet.  He has required a moderate diuresis.  His oxygen has been  weaned and he maintains good saturations on room air.  He has had some  moderate difficulty with pain control, but this has improved over time.  He has had a postoperative thrombocytopenia, but this has  stabilized.  Most recent value is 99,000 on June 18, 2008.  Hemoglobin and  hematocrit show a moderate postoperative anemia with most recent count  on June 18, 2008, 8.9 and 25 respectively.  He has tolerated a  routine advancement in his activities using standard protocols.  His  incisions are all healing well.  His blood pressure required medication  adjustment, but this has steadily improved.  Overall, his status is felt  to be stable for discharge on June 20, 2008, or June 21, 2008,  pending further evaluation.   MEDICATIONS ON DISCHARGE:  1. Lisinopril/hydrochlorothiazide 20/25 mg once daily.  2. Pilocarpine 5 mg 4 times daily and p.r.n.  3. Adderall 30 mg 3 times daily.  4. Cymbalta 60 mg daily.  5. Bupropion SR 200 mg 2 times daily.  6. Omeprazole 20 mg in the a.m.  7. Niacin 500 mg daily.  8. Coenzyme Q10 300 mg daily.  9. Aspirin 325 mg daily.  10.Lopressor 25 mg twice daily for pain.  11.Oxycodone 5-10 mg every 4-6 hours as needed.   FOLLOWUPS:  Dr. Allyson Sabal in 2 weeks, Dr. Liliane Bade in 3 weeks.   FINAL DIAGNOSES:  1. Severe coronary artery disease as described above, now status post      surgical revascularization.  2. Coronary artery bypass grafting x5.   OTHER DIAGNOSES:  1. Extracranial cerebrovascular occlusive disease.  2. Squamous cell carcinoma of the tongue.  3. Hypertension.  4. Dyslipidemia.  5. Previous neck surgery.  6. History of tobacco abuse.  7. Postoperative anemia.  8.  Postoperative thrombocytopenia.      Rowe Clack, P.A.-C.      Salvatore Decent Dorris [name], M.D.  Electronically Signed    WEG/MEDQ  D:  06/20/2008  T:  06/21/2008  Job:  [id number]   cc:   Nanetta Batty, M.D.  Balinda Quails, M.D.  Pramod P. Pearlean Brownie, MD

## 2011-02-13 NOTE — Assessment & Plan Note (Signed)
OFFICE VISIT   [name], [name]  DOB:  [birth year]                                       01/22/2008  EAVWU#:[id number]   The patient is a 60 year old gentleman previously seen and evaluated in  March of 2007 with a history of irradiation to the neck for a T2, N0  squamous cell carcinoma of the base of the tongue.  Radiotherapy was  completed in 2002.  He has had no evidence of recurrent head and neck  cancer.   This time he denies any neurologic symptoms.  No amaurosis fugax,  sensory or motor deficit.  No speech problems.   He underwent a carotid Doppler evaluation on 12/25/2007 and this reveals  significant progression of his right internal carotid artery stenosis.  There is severe plaque with mixed calcific shadowing and velocities  489/197 cm/sec.  This is consistent with a greater than 80% right  internal carotid artery stenosis.  The left carotid bifurcation reveals  a 40-59% stenosis.   CURRENT MEDICATIONS:  1. Include amlodipine 10 mg daily.  2. Lisinopril/hydrochlorothiazide 20/25 daily.  3. Pilocarpine 5 mg q.i.d.  4. Adderall 30 mg t.i.d.  5. Cymbalta 60 mg daily.  6. Bupropion HCL SR 200 mg b.i.d.  7. Aspirin 325 mg daily.  8. Niacin 60 mg daily.   Other medical history includes hypertension, hyperlipidemia, nonspecific  hepatitis, gastric ulcer and possible remote TIA.   PAST SURGICAL HISTORY:  Includes a cervical laminectomy in 2001.   PHYSICAL EXAMINATION:  General:  The patient appears generally well, 60  years of age.  Alert and oriented.  No acute distress.  Vital signs:  BP  126/80, pulse 80 per minute regular, respirations 16 per minute.  Neck:  He has a right carotid bruit.  Cranial nerves are intact.  Strength  equal bilaterally.  Heart:  His heart sounds are normal without murmurs.  Chest:  Chest is clear with equal entry bilaterally.   The patient shows evidence of significant progression of his right  internal carotid  artery stenosis.  At this time he is asymptomatic.  He  will continue aspirin 325 mg daily.  Due to his history of irradiation  to the neck he will be referred to Dr. Pearlean Brownie for a pre carotid stent  workup.   Balinda Quails, M.D.  Electronically Signed   PGH/MEDQ  D:  01/22/2008  T:  01/23/2008  Job:  [id number]   cc:   Artist Pais Kathrynn Running, M.D.  Jonita Albee, M.D.  Delia Heady, MD

## 2011-02-13 NOTE — Discharge Summary (Signed)
NAMEKENDRA, WOOLFORD               ACCOUNT NO.:  [id number]   MEDICAL RECORD NO.:  [id number]          PATIENT TYPE:  OIB   LOCATION:  3731                         FACILITY:  [facility]   PHYSICIAN:  Nanetta Batty, M.D.   DATE OF BIRTH:  1950-10-24   DATE OF ADMISSION:  04/26/2008  DATE OF DISCHARGE:  04/26/2008                               DISCHARGE SUMMARY   DISCHARGE DIAGNOSES:  1. Significant coronary artery disease of three vessels including LAD      disease, left circumflex 99% stenosis in the midportion straddling      the marginal branch, RCA was totally occluded proximally with left-      to-right collaterals from the circumflex in the LAD.  LAD had mid      80% stenosis.  In addition, the patient's left internal mammary      artery had a 95% osteal stenosis.  The right internal mammary      artery had 60% ostial proximal stenosis.  2. Positive Myoview stress test with inferior ischemia.  3. High-grade right internal carotid artery stenosis.  4. Hyperlipidemia.  5. Hypertension.  6. Squamous cell carcinoma of his tongue with radiation therapy at      [facility].  7. High-grade right internal carotid artery stenosis by Doppler.   DISCHARGE CONDITION:  Stable.   DISCHARGE MEDICATIONS:  1. Amlodipine 10 mg daily.  2. Lisinopril/hydrochlorothiazide 20/25 daily.  3. Pilocarpine 5 mg four times a day.  4. Adderall 30 mg three times a day.  5. Cymbalta 60 mg daily.  6. Bupropion SR 200 mg twice a day.  7. Plavix 75 daily which was placed on hold.  8. Enteric-coated aspirin 81 mg daily.  9. Please note, the patient cannot tolerate TriCor and Crestor.  10.Nitroglycerin sublingual under your tongue while sitting 1 every 5      minutes up to 2 every 10 minutes for chest pain, if no relief call      911.   DISCHARGE INSTRUCTIONS:  1. Low-sodium and heart-healthy diet.  2. Increase activity slowly.  3. May shower, April 27, 2008.  No lifting for 2 days.  No driving for      2  days.  4. Wash cath site with soap and water.  Call if any bleeding,      swelling, or drainage.  5. Follow up with Dr. Madilyn Fireman on Thursday.  Follow up with Dr. Allyson Sabal,      the office will call with date and time.   HISTORY OF PRESENT ILLNESS:  A 60 year old white married male patient  with a history of hyperlipidemia and hypertension.  No MI in the past or  stroke.  No chest pain.  He did have squamous cell carcinoma of his  tongue  with radiation therapy to his neck at [facility] which was  curative.  The patient had been seen by Dr. Madilyn Fireman secondary to positive  carotid Dopplers with a high-grade right ICA stenosis and because of  history of radiation to his neck, it was felt carotid stenting was his  best treatment plan.  He has  been seen by Dr. Pearlean Brownie also.   ALLERGIES:  LIPITOR bothers his throat.   In preparation for stent to the right carotid, he underwent 2D echo and  nuclear stress test.  He was found to have inferior ischemia on the  nuclear study.  Additionally, 2D echo revealed EF greater than 55% and  there was no significant valvular abnormalities on echo.  The patient  was brought in electively for cardiac catheterization on April 26, 2008  and was found to have significant coronary artery disease as well as  left internal mammary artery disease of 95% ostially and 50-60 right  internal mammary artery disease.  EF was greater than 60% on cath.  Dr.  Dorris Fetch was asked to consult.  Dr. Dorris Fetch felt that approaching  with bypass and carotid endarterectomy is the patient's best choice but  is not favorable given prescriptions after right carotid stent.  Therefore Dr. Dorris Fetch felt delaying the bypass grafting for 6-8  weeks would be the best choice though he had to acknowledge there is  certainly risk of MI but risk of stroke with bypass as the first  approach was probably higher.  He was going to discuss it with Dr.  Madilyn Fireman.  The patient was felt stable to be  discharged home which he was.  The patient was with the understanding that he was at risk for stroke  and MI at this time but, that they felt it was more important to fix the  carotid first.   The patient will follow up as previously instructed.      Darcella Gasman. Annie Paras, N.P.      Nanetta Batty, M.D.  Electronically Signed    LRI/MEDQ  D:  05/29/2008  T:  05/30/2008  Job:  [id number]   cc:   Balinda Quails, M.D.  Jonita Albee, M.D.  Salvatore Decent Dorris Fetch, M.D.

## 2011-02-13 NOTE — Procedures (Signed)
CAROTID DUPLEX EXAM   INDICATION:  Follow up of known carotid artery disease.   HISTORY:  Diabetes:  No.  Cardiac:  Yes.  Hypertension:  Yes.  Smoking:  No.  Previous Surgery:  No.  CV History:  No.  Amaurosis Fugax No, Paresthesias No, Hemiparesis No.                                       RIGHT             LEFT  Brachial systolic pressure:         126               120  Brachial Doppler waveforms:         Within normal limits                Within normal limits  Vertebral direction of flow:        Antegrade         Antegrade  DUPLEX VELOCITIES (cm/sec)  CCA peak systolic                   105               159  ECA peak systolic                   Occluded          253  ICA peak systolic                   245, mid          177  ICA end diastolic                   53                62  PLAQUE MORPHOLOGY:                  Heterogenous      Heterogenous  PLAQUE AMOUNT:                      Moderate          Moderate  PLAQUE LOCATION:                    CCA, BIF, ICA, ECA   IMPRESSION:  1. 50-79% stenosis of the right internal carotid artery.  2. 40-59% stenosis of the left internal carotid artery.   ___________________________________________  P. Liliane Bade, M.D.   AC/MEDQ  D:  11/04/2008  T:  11/04/2008  Job:  [id number]

## 2011-02-13 NOTE — Procedures (Signed)
CAROTID DUPLEX EXAM   INDICATION:  Followup of known carotid artery disease.  Patient  complains of transient left-sided facial numbness which occurs several  times a day for the past 1 year, mostly with position.   HISTORY:  Diabetes:  No.  Cardiac:  No.  Hypertension:  Yes.  Smoking:  No.  Previous Surgery:  No.  CV History:  Amaurosis Fugax No, Paresthesias (?), Hemiparesis No                                       RIGHT             LEFT  Brachial systolic pressure:         120               144  Brachial Doppler waveforms:         WNL               WNL  Vertebral direction of flow:        Antegrade (atypical)                Antegrade  DUPLEX VELOCITIES (cm/sec)  CCA peak systolic                   63                124  ECA peak systolic                   224               140  ICA peak systolic                   489 (P-M)         133  ICA end diastolic                   197               32  PLAQUE MORPHOLOGY:                  Mixed, calcific with shadowing      Mixed, calcific with  shadowing  PLAQUE AMOUNT:                      Severe            Moderate  PLAQUE LOCATION:                    Bifurcation, ICA, ECA               Bifurcation, ICA    IMPRESSION:  1. Right 80-99% ICA stenosis with highest velocity taken at the P/M      ICA.  2. Left 40-59% ICA stenosis.  3. Right ECA stenosis.  4. Bilateral antegrade flow in vertebral arteries; however, right      vertebral artery demonstrates early systolic deceleration      consistent with subclavian stenosis.  5. Patient is scheduled to see Dr. Madilyn Fireman on 01/22/2008 at 9:30 a.m.   ___________________________________________  P. Liliane Bade, M.D.   PB/MEDQ  D:  12/25/2007  T:  12/25/2007  Job:  [id number]

## 2011-02-13 NOTE — Procedures (Signed)
CAROTID DUPLEX EXAM   INDICATION:  Follow up known carotid artery disease with history of neck  radiation.   HISTORY:  Diabetes:  no  Cardiac:  yes  Hypertension:  yes  Smoking:  no  Previous Surgery:  Neck radiation  CV History:  no  Amaurosis Fugax No, Paresthesias No, Hemiparesis No                                       RIGHT             LEFT  Brachial systolic pressure:         130               160  Brachial Doppler waveforms:         biphasic          biphasic  Vertebral direction of flow:        Antegrade         antegrade  DUPLEX VELOCITIES (cm/sec)  CCA peak systolic                   65                124  ECA peak systolic                   occluded          642  ICA peak systolic                   183               209  ICA end diastolic                   49                79  PLAQUE MORPHOLOGY:                  heterogenous      heterogenous  PLAQUE AMOUNT:                      moderate          moderate  PLAQUE LOCATION:                    ICA and ECA       ICA and ECA   IMPRESSION:  1. Stenosis, 60% to 79%, noted in the left internal carotid artery.  2. Stenosis, 40% to 59%, noted in the right internal carotid artery.  3. Antegrade bilateral vertebral arteries.   ___________________________________________  Janetta Hora Fields, MD   MG/MEDQ  D:  11/02/2009  T:  11/03/2009  Job:  [id number]

## 2011-02-13 NOTE — Op Note (Signed)
[name], [name]               ACCOUNT NO.:  [id number]   MEDICAL RECORD NO.:  [id number]          PATIENT TYPE:  INP   LOCATION:  2032                         FACILITY:  [facility]   PHYSICIAN:  Anselmo Rod, M.D.  DATE OF BIRTH:  10-26-1950   DATE OF PROCEDURE:  06/29/2008  DATE OF DISCHARGE:  06/30/2008                               OPERATIVE REPORT   PROCEDURE PERFORMED:  Esophagogastroduodenoscopy with distal esophageal  biopsies.   ENDOSCOPIST:  Anselmo Rod.   INSTRUMENT USED:  Pentax video panendoscope.   INDICATIONS FOR PROCEDURE:  A 60 year old white male who has had recent  CABG, undergoing EGD for dysphagia, rule out esophagitis, strictures,  etc.   PREPROCEDURE PREPARATION:  Informed consent was procured from the  patient.  The patient was fasted for 4 hours prior to the procedure.  The risks, benefits of the procedure were discussed with the patient in  great detail.   PREPROCEDURE PHYSICAL:  The patient had stable vital signs.  Neck  supple.  Chest clear to auscultation.  S1, S2 regular.  Abdomen soft  with normal bowel sounds.   DESCRIPTION OF PROCEDURE:  The patient was placed in the left lateral  decubitus position and sedated with 100 mcg of Fentanyl and 8 mg of  Versed given intravenously in slow incremental doses.  Once the patient  was adequately sedated and maintained on low-flow oxygen and continuous  cardiac monitoring, the Pentax video panendoscope was advanced through  the mouthpiece over the tongue into the esophagus under direct vision.  The vocal cords appeared healthy.  There was no evidence of oral thrush.  The entire esophageal mucosa appeared healthy with no evidence of  esophagitis or Barrett mucosa.  There were transverse mucosal ridges  noted throughout the length of the esophagus.  Distal esophageal  biopsies were done to rule out eosinophilic esophagitis.  On advancing  the scope into the stomach, a small hiatal hernia was seen on  high  retroflexion. The rest of gastric mucosa and the proximal small bowel  appeared normal.  There was no obstruction.  The patient tolerated the  procedure well without immediate complications.   IMPRESSION:  1. Transverse folds in the esophagus; biopsies done from the distal      esophagus to rule out eosinophilic esophagitis.  2. Small hiatal hernia seen on high retroflexion.  3. Normal-appearing gastric mucosa and proximal small bowel.  4. No evidence of candidiasis throughout the length of the esophagus.   RECOMMENDATIONS:  1. Await pathology results.  2. Continue Protonix.  3. Avoid all nonsteroidals for now.  4. Outpatient followup within the next week.  Further recommendations.      Anselmo Rod, M.D.  Electronically Signed     JNM/MEDQ  D:  06/30/2008  T:  07/01/2008  Job:  [id number]   cc:   Salvatore Decent. Dorris Fetch, M.D.  [address]  [place]  [place] [address]   Nanetta Batty, M.D.  Fax: [phone]   Illa Level, MD

## 2011-02-13 NOTE — Cardiovascular Report (Signed)
[name], [name] NO.:  [id number]   MEDICAL RECORD NO.:  [id number]          PATIENT TYPE:  INP   LOCATION:  2899                         FACILITY:  [facility]   PHYSICIAN:  Nanetta Batty, M.D.   DATE OF BIRTH:  1951/02/04   DATE OF PROCEDURE:  DATE OF DISCHARGE:  05/05/2008                            CARDIAC CATHETERIZATION   Mr. Minix is a 60 year old thin-appearing married white male, father of  one, grandfather of four grandchildren, referred by Dr. Madilyn Fireman initially  for cardiovascular evaluation prior to elective carotid stenting.  He  has positive risk factors including remote tobacco, hypertension,  hyperlipidemia.  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  Carotid Dopplers performed in Dr. Madilyn Fireman' office showed high-grade right  ICA stenosis.  He was evaluated by Dr. Pearlean Brownie who felt he was a good  candidate for carotid artery stenting.  A Myoview stress test done prior  to this revealed inferior ischemia.  The cardiac catheterization  revealed 3-vessel disease with normal LV function.  He was evaluated by  Dr. Dorris Fetch who felt he was a good candidate for elective bypass  grafting.  He presents now for arch angiography and cerebral angiography  to define his carotid anatomy and potentially perform elective carotid  artery stenting because of a hostile neck.   DESCRIPTION OF PROCEDURE:  The patient was brought to the second floor  of [facility] in the postabsorptive state.  He was  not premedicated.  His right groin was prepped and shaved in the usual  sterile fashion.  Xylocaine 1% was used for local anesthesia.  A 5-  French sheath was inserted into the right femoral artery using standard  Seldinger technique.  A 5-French pigtail catheter and JB-1 catheters  were used for arch angiography and selective 4-vessel cerebral  angiography in both intra and  extracranial views.  Visipaque dye was  used entirety of the case.  Retrograde aortic pressures were monitored  during the case.   ANGIOGRAPHIC RESULTS:  1. Arch aortogram.  1a.  Bovine arch.  1. Right carotid.  2a.  There was a 30-40% proximal right ICA stenosis.  2b.  The right external carotid was occluded in its origin.  1. Right vertebral.  The extracranial right vertebral was widely      patent.  The vertebral was occluded intracranially.  2. 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  There was a      70-80% left external carotid artery stenosis.  The left internal      carotid appeared widely patent.  3. Left vertebral is a dominant vessel and was free of significant      disease extracranially.  It filled the basilar and posterior      communicating arteries.  Intracranial portion will be read by the      radiologist.   IMPRESSION:  No evidence of hemodynamically significant internal carotid  artery disease  bilaterally.  This is a false-positive duplex ultrasound.  The patient will be treated medically.  The intracranial portion of this  exam will be  read by the neuroradiologist.  The patient will be discharged home today  as an outpatient and will see me back in the office.  He will be set up  for elective coronary artery bypass grafting once his Plavix is  discontinued and he is given time for this to washout.  He left the lab  in stable condition.      Nanetta Batty, M.D.  Electronically Signed     JB/MEDQ  D:  05/05/2008  T:  05/05/2008  Job:  [id number]   cc:   [facility]  Jonita Albee, M.D.  Pramod P. Pearlean Brownie, MD  P. Liliane Bade, M.D.  [facility]

## 2011-02-13 NOTE — Consult Note (Signed)
[name], [name]               ACCOUNT NO.:  [id number]   MEDICAL RECORD NO.:  [id number]          PATIENT TYPE:  INP   LOCATION:  2032                         FACILITY:  [facility]   PHYSICIAN:  Anselmo Rod, M.D.  DATE OF BIRTH:  03-Feb-1951   DATE OF CONSULTATION:  06/28/2008  DATE OF DISCHARGE:                                 CONSULTATION   REASON FOR CONSULTATION:  Difficulty swallowing.   ASSESSMENT:  1. Dysphagia.  Rule out esophageal candidiasis with esophagitis versus      esophageal stricture.  The patient has been recently treated for      oral thrush with Diflucan.  2. History of gastroesophageal reflux disease and hiatal hernia on      Prilosec at the present time.  3. History of esophagitis diagnosed by esophagogastroduodenoscopy in      the past.  4. Status post coronary artery bypass graft x5 on June 16, 2008.  5. History of high-grade right internal carotid artery stenosis.  6. Hypertension on amlodipine, lisinopril/hydrochlorothiazide.  7. Dyslipidemia on niacin.  8. History of squamous cell carcinoma of the tongue treated with      radiation 7 years ago.  9. History of aseptic meningitis.  10.History of neck surgery (posterior cervical microendoscopic      laminectomy and microdiskectomy C6-C7).  11.History of tobacco abuse. The patient stopped smoking 10 years ago.  12.Peripheral arterial disease.  13.Abnormal liver function tests (probably multifactorial).  14.History of attention deficit disorder and obsessive compulsive      disorder on Cymbalta and Wellbutrin.   RECOMMENDATIONS:  1. Continue PPIs.  2. EGD in the morning.  3. Avoid nonsteroidals for now.   DISCUSSION:  [name] is a 60 year old white male known to me  for several years.  The patient had an EGD in 2001 when he was diagnosed  with reflux esophagitis, gastritis and a hiatal hernia.  He had a  screening colonoscopy in 2006 that was essentially normal.  The patient  was in  his usual state of health till he was diagnosed with significant  coronary artery disease on cardiac catheterization by Dr. Nanetta Batty  on May 05, 2008.  He subsequently underwent a CABG on June 16, 2008 by Dr. Dorris Fetch.  The patient claims he had problems with  dysphagia in the past which was thought to be a reaction to the statins.  When he discontinued Lipitor, his symptoms eventually improved.  However  recently, he has had recurrence of his symptoms.  He was diagnosed with  oral thrush and treated with Diflucan in the recent past.  The patient  states he restarted his cholesterol-lowering drugs in July of this year  and has had onset of his dysphagia again.  He feels the problem  swallowing solids, liquids and admits it is more with liquids.  He has  had a 20 pound weight loss since his surgery.  He denies a history of  melena or hematochezia.  Appetite is fair.  There is no history of  diarrhea or constipation.  He has 1 bowel movement per day.  There is no  known family history of stomach or colon cancer.   PAST MEDICAL HISTORY:  See list above.   ALLERGIES:  STATINS.   HOME MEDICATIONS:  1. Amlodipine 30 mg daily.  2. Lisinopril/HCTZ 20/25 one tablet daily.  3. Pilocarpine 5 mg 4 times a day p.r.n.  4. Adderall 30 mg t.i.d.  5. Cymbalta 60 mg daily.  6. Wellbutrin 200 mg b.i.d.  7. Omeprazole 20 mg daily.  8. Niacin 500 mg daily.  9. Enzyme CoQ-10 300 mg daily.  10.Aspirin 325 mg daily.   HOSPITAL MEDICATIONS:  1. Essentially the same with the addition of Xanax p.r.n.  2. Morphine p.r.n.  3. Oxycet p.r.n.  4. The patient also receiving Ambien p.r.n.   SOCIAL HISTORY:  He is married and lives with his wife in [place].  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  He  drinks 4-5 alcoholic beverages a day.  He used to smoke a pack a day  till he quit smoking about 10 years ago.  He denies illicit drug use.   FAMILY HISTORY:   Noncontributory.   PHYSICAL EXAMINATION:  GENERAL:  Reveals a middle-aged man who looks  older than his stated age in no acute distress.  VITAL SIGNS:  Temperature 97.4, pulse 75 per minute, respiratory rate 20  per minute, blood pressure 102/67, O2 sat is 98% on room air.  HEENT:  Reveals preserved facial asymmetry.  Oropharyngeal mucosa is  without exudates.  There is no evidence of thrush.  NECK:  Supple.  Surgical scar is present.  CHEST:  Clear to auscultation.  HEART:  S1 and S2, regular.  ABDOMEN:  Soft, nontender with normal bowel sounds.  RECTAL:  Deferred.   LABORATORY DATA:  Reveals white count of 6.9, hemoglobin 11.6,  hematocrit 33.9, platelets 612.  CMP reveals sodium 136, potassium 3.4,  chloride 100, CO2 25, glucose 139, BUN 13, creatinine 1.12, total bili  0.6, alkaline phosphatase 109, AST 50, ALT 91, total protein 6.3,  albumin 3.2, calcium 9.3.   DIAGNOSTICS:  Chest x-ray done on June 24, 2008 revealing improving  bilateral pleural effusions, status post re-do sternotomy without  defining complications, background of emphysema.   PLAN:  As above.  Further recommendations to be written in followup.      Anselmo Rod, M.D.  Electronically Signed     JNM/MEDQ  D:  06/28/2008  T:  06/29/2008  Job:  [id number]   cc:   Salvatore Decent. Dorris Fetch, M.D.  Nanetta Batty, M.D.  [place] Cleta Alberts, M.D.

## 2011-02-13 NOTE — Assessment & Plan Note (Signed)
OFFICE VISIT   [name], [name]  DOB:  [birth year]                                        August 06, 2008  CHART #:  [id number]   The patient is a 60 year old gentleman, who had coronary artery bypass  grafting in September, a difficult postoperative course, particularly  regards to swallowing.  He has previously had squamous cell carcinoma  treated with radiation to his neck.  At one point, he was admitted to  the hospital for dehydration.  Overall, his swallowing is better than it  was at that point in time, but he still does have some difficulty with  it.  He still does have some significant sternal pain.  He does not have  any clicking or popping.  He is anxious to start cardiac rehab.   PHYSICAL EXAMINATION:  GENERAL:  The patient is a 60 year old white male  in no acute distress.  VITAL SIGNS:  Blood pressure is 151/95, pulse 94, respirations 18, his  oxygen saturation is 90% on room air.  CHEST:  His sternal incision is clean, dry, and intact.  Sternum is  stable.  No sign of infection.   IMPRESSION:  The patient continues to make progress slowly, but surely.  I think he is ready to start cardiac rehab.  He is going to contact Dr.  Hazle Coca office about thinking to go ahead for that.  In terms of  swallowing, I think that she will continue to improve with time.  In  regards to his sternal pain, I gave him a prescription for additional  #50 Darvocet tablets, Darvocet-N 100 one to two 3 times daily as needed  for pain.  No refills.  Hopefully, between what that he has left from  his previous prescription, this prescription now will get him pass the  point where needs narcotics.  If not we could call in additional for him  if needed.  At this point in time, he is doing well from a surgical  standpoint.  He will continue to follow up with Dr. Nanetta Batty  regarding  his medical treatment of his coronary artery disease.  I would be happy  to see him  back anytime if could be of any further assistance with his  care.   Salvatore Decent Dorris Fetch, M.D.  Electronically Signed   SCH/MEDQ  D:  08/06/2008  T:  08/06/2008  Job:  [id number]   cc:   Nanetta Batty, M.D.  Jonita Albee, M.D.  Anselmo Rod, M.D.

## 2011-02-13 NOTE — Assessment & Plan Note (Signed)
OFFICE VISIT   [name], [name]  DOB:  06/25/1951                                       11/04/2008  EAVWU#:[id number]   The patient returns to the office today for continued surveillance of  his carotid disease.  He has a history of irradiation to the neck and  moderate bilateral internal carotid artery stenosis.  He has undergone  coronary artery bypass in September of last year.  At that time he was  found to moderate bilateral internal carotid artery stenosis.   He returns at this time for continued surveillance and Doppler reveals  fairly stable velocities in his internal carotid artery with 60-79%  right ICA and 40-59% left ICA stenoses.  Antegrade vertebral flow  present bilaterally.   The patient has been free of any neurologic symptoms.  Denies sensory,  motor, visual deficit.  No speech problems.  No swallowing  abnormalities.  No gait abnormalities.   He appears generally well.  Alert and oriented.  Soft right carotid  bruit present.  Cranial nerves intact.  Strength equal bilaterally.  1+  reflexes.   The patient has a history of irradiation to the neck and moderate  bilateral internal carotid artery stenosis.  We will plan to continue  followup with surveillance in 6 months by carotid Doppler.   Balinda Quails, M.D.  Electronically Signed   PGH/MEDQ  D:  11/04/2008  T:  11/05/2008  Job:  [id number]

## 2011-02-13 NOTE — Discharge Summary (Signed)
[name], FELDPAUSCH               ACCOUNT NO.:  [id number]   MEDICAL RECORD NO.:  [id number]          PATIENT TYPE:  INP   LOCATION:  2032                         FACILITY:  [facility]   PHYSICIAN:  Salvatore Decent. Dorris Fetch, M.D.DATE OF BIRTH:  1951/09/24   DATE OF ADMISSION:  06/28/2008  DATE OF DISCHARGE:  06/30/2008                               DISCHARGE SUMMARY   FINAL DIAGNOSIS:  Dehydration with nausea and vomiting status post  coronary artery bypass grafting x5 on June 16, 2008.   IN-HOSPITAL DIAGNOSIS:  Hypotension.   SECONDARY DIAGNOSES:  1. Squamous cell carcinoma of the tongue status post radiation.  2. Hypertension.  3. Dyslipidemia.  4. History of tobacco abuse.   IN-HOSPITAL OPERATIONS AND PROCEDURES:  EGD with esophageal biopsy.   HISTORY AND PHYSICAL AND HOSPITAL COURSE:  The patient is a 60 year old  gentleman who recently had coronary artery bypass grafting x5 on  June 16, 2008 by Dr. Dorris Fetch.  The patient has a history of  squamous cell carcinoma of the tongue status post neck radiation.  The  patient's postoperative course was relatively uncomplicated and he was  discharged home.  He unfortunately presented back to the office several  days postdischarge with the complaints of fever of 101.  The patient was  started on Keflex empirically as an outpatient for some complaints of  drainage from the chest tube site.  He then returned to the office on  June 24, 2008, with complaints of painful swallowing and was  diagnosed with thrush and started on Diflucan in addition to the Keflex.  Unfortunately, the patient did not progress and started having general  malaise, nausea, vomiting, and inability to take p.o. because of sore  throat.  Dr. Dorris Fetch saw and evaluated him in the office.  He felt  that the patient will need admission to the hospital for treatments of  dehydration and consultation of GI.  For further details of the  patient's past  medical history and physical exam, please see dictated H  and P.   Mr. Maynor was admitted to [facility] on June 28, 2008  with diagnosis of dehydration.  He was started on some IV fluids as well  as magic mouthwash for his thrush.  He was started on clear liquid diet.  Dr. Loreta Ave was consulted for evaluation of the patient's dysphagia.  He  started the patient on Protonix 40 mg b.i.d. and scheduled an EGD for  June 29, 2008.  This was done without difficulty and biopsy of the  distal esophagus was obtained.  Currently, pathology report pending.  By  June 30, 2008, the patient was tolerating clears and had been  advanced to soft diet, and was tolerating this well.  He was feeling  much better.  It was felt that his dehydration had resolved and IV  fluids were discontinued.  Currently, awaiting for the evaluation by Dr.  Loreta Ave this a.m. June 30, 2008.  It was felt that no further testing  is required, and the patient is stable.  He may be able to be discharged  home today, June 30, 2008.  Noted during the patient's hospital  course, he was noted to be hypotensive.  His blood pressure medication  was held and not restarted.  Blood pressure was stable low on June 30, 2008, with blood pressure 95/70.  The patient was out of bed  ambulating well without difficulty.  His incisions were monitored during  his hospital course and remained stable.  His chest tube site was stable  with no cellulitis and no drainage.  He had not been restarted on the  Keflex on admission.   On June 30, 2008, the patient was noted to be afebrile.  He is  normotensive.  Blood pressure 95/70.  Labs showed a sodium of 137,  potassium 3.9, chloride of 102, bicarbonate of 29, BUN of 10, creatinine  of 0.99, and glucose of 95.  AST was 29, ALT 65, and total bilirubin was  0.6.  CBC day prior showed white count 5.6, hemoglobin 10.8, hematocrit  31.5, and platelet count 558.   Again, awaiting Dr. Kenna Gilbert evaluation,  recommendations, further testing versus discharge home.   FOLLOWUP APPOINTMENTS:  A followup appointment had been already  scheduled with Dr. Dorris Fetch for July 15, 2008 at 12:45 p.m.  The  patient will need to obtain PA lateral chest x-ray 30 minutes prior to  this appointment.  The patient will need to follow up with Dr. Loreta Ave as  directed.   ACTIVITY:  The patient instructed no driving, he agrees to do so, no  lifting over 10 pounds.  He is told to ambulate 3-4 times per day,  progress as tolerated and continue his breathing exercises.   INCISIONAL CARE:  The patient is told to continue washing of his  incisions using soap and water.  He is to contact the office if he  develops any drainage or opening from any of his incision sites.   DIET:  The patient is educated on diet to be low fat and low salt and  continue with soft diet.   DISCHARGE MEDICATIONS:  1. Adderall 30 mg t.i.d.  2. Cymbalta 60 mg daily.  3. Wellbutrin 200 mg b.i.d.  4. Niacin 500 mg daily.  5. Coenzyme Q10 300 mg daily.  6. Aspirin 325 mg daily.  7. Protonix 40 mg b.i.d.  8. Magic mouthwash 5 mL swish and swallow t.i.d. x10 days.      Theda Belfast, PA      Salvatore Decent. Dorris Fetch, M.D.  Electronically Signed    KMD/MEDQ  D:  06/30/2008  T:  07/01/2008  Job:  [id number]   cc:   Anselmo Rod, M.D.

## 2011-02-13 NOTE — Assessment & Plan Note (Signed)
OFFICE VISIT   [name], [name]  DOB:  April 13, 1951                                        July 08, 2008  CHART #:  [id number]   The patient is a 60 year old gentleman with a history of squamous cell  carcinoma of the tongue.  He was treated with radiation for that.  He  had coronary artery bypass grafting x5 on June 16, 2008.  His  initial postoperative course was uncomplicated and he developed fevers  and difficulty swallowing.  He was readmitted.  He was treated for  thrush and was ultimately seen by Dr. Loreta Ave and had an esophagoscopy  because he was having extreme difficulty swallowing and was dehydrated.  There really was not any pathology noted there.  Today, he had a  followup appointment with Dr. Man.  He saw her this morning, he had a  barium swallow done subsequently, which showed some pulling in the  vallecula with swallowing and was cleared with coughing.  There was some  very low-grade aspiration.  He was very anxious and upset and worried  about his difficulty swallowing.  He was taking an hour to eat a piece  of chicken.  Dr. Loreta Ave was actually concerned that he might be getting  more depressed and concerned that he might actually try and harm  himself.  He saw Dr. Allyson Sabal subsequently and now we asked him to come  back to the office to further discuss his situation.   The patient said he continues to have difficulty swallowing.  She is  concerned that maybe getting dehydrated.  He says he is able to take  liquids as solids seemed to stick and take some extremely long time.  He  is not having difficulty with his appetite.  He is actually hungry, he  just cannot eat enough.  He has not had any anginal-type chest pains and  has not had any shortness of breath.  He did notice some drooping of his  left eyelid, which was particularly bad on Sunday and actually is  improved over the course of the week, although it is still present.   PHYSICAL  EXAMINATION:  GENERAL:  The patient is a 60 year old white  male, in no acute distress.  VITAL SIGNS:  Blood pressure is 130/90, pulse 91, respirations 18, and  his ox saturation is 97% on room air.  He does have a left ptosis.  His  pupils are equal, round, and reactive.  NEUROLOGIC:  He is otherwise neurologically intact.  Incisions are  clean, dry, and intact.   IMPRESSION:  The patient is a 60 year old gentleman who is now about 3  weeks out from coronary artery bypass grafting.  He has had a quite  difficult time mostly related to his swallowing issues.  He became  dehydrated at one point.  He also is very depressed over his lack of  progress on that issue.  I had a long discussion with he and his wife  and reiterated that about 3 weeks is usually the low point in the  recovery following the surgery that people do tend to have feelings of  depression.  He expressively states he has no intention or thoughts of  harming himself.  He has had suicidal ideation in the past and adamantly  denies that at the present time.  We are going to try to use Ensure or  Boost to see if he can increase his caloric intake with liquid  supplements.  I recommended he might consider calling the radiation or  ENT doctors to see if they have any insights or maybe after seeing this  type of swallowing difficulty.  We did discuss potential for hospital  admission that further workup is eyelid droop and he does not want to be  admitted to the hospital at this time.  I will plan to see him back in 1  week to check on his progress.   Salvatore Decent Dorris Fetch, M.D.  Electronically Signed   SCH/MEDQ  D:  07/08/2008  T:  07/09/2008  Job:  [id number]   cc:   Nanetta Batty, M.D.  Anselmo Rod, M.D.  Jonita Albee, M.D.

## 2011-02-13 NOTE — H&P (Signed)
HISTORY AND PHYSICAL EXAMINATION   June 08, 2008   Re:  Gregory, [name]         DOB:  10-Jan-1951   CHIEF COMPLAINT:  Shortness of breath with exertion.   HISTORY OF PRESENT ILLNESS:  The patient is a 60 year old gentleman, who  recently was found to have, which was thought to be high-grade right  carotid stenosis.  He has a history of squamous cell carcinoma of the  tongue treated with radiation therapy.  He was found on physical  examination to have asymptomatic carotid bruits.  A carotid duplex  revealed what appeared to be high-grade right internal carotid stenosis.  Because of his previous radiation therapy, he was not felt to be a good  candidate for carotid endarterectomy and was evaluated for carotid  stenting.  As part of his preoperative evaluation, he was sent for  cardiac clearance.  Dr. Nanetta Batty did a stress Myoview, which  showed inferior ischemia.  On cardiac catheterization, he was found to  have total occlusion of his right coronary and a tight bifurcation  lesion in the circumflex and 80% stenosis in the diagonal branch.  He  did not have significant LAD disease.  He did have a tight stenosis at  the origin of his left mammary as well as some moderate stenosis at the  origin of his right mammary.  It was felt that he would benefit most  from a right carotid stent followed by coronary artery bypass grafting.  He was taken to the peripheral vascular lab on May 05, 2008, where he  was found to have only a 30%-40% stenosis in the proximal right internal  carotid and right external carotid was occluded.  He had a normal left  internal carotid.  He did have some disease in the left external  carotid.  Given that there was no hemodynamically significant disease  obviously stenting was not performed.  He has been taken off Plavix and  now presents for coronary artery bypass grafting.   PAST MEDICAL HISTORY:  Significant for extracranial  carotid disease,  squamous cell carcinoma of the tongue, hypertension, dyslipidemia,  previous neck surgery, and history of tobacco abuse.   CURRENT MEDICATIONS:  Amlodipine 10 mg daily,  lisinopril/hydrochlorothiazide 20/25 one tablet daily, pilocarpine 5 mg  q.i.d. p.r.n., Adderall 30 mg t.i.d., Cymbalta 60 mg daily, bupropion  200 mg b.i.d., omeprazole 20 mg daily, niacin 500 mg daily, [employer]-  Q10 300 mg daily, aspirin 325 mg daily.  He has been off Plavix.   He has an adverse reaction to statins, which causes muscle aching.   REVIEW OF SYSTEMS:  Shortness of breath with exertion.  He denies any  TIA or stroke symptoms.  No history of excessive bleeding or bruising.  No chest pressure or tightness.  No PND, orthopnea, or peripheral edema.  All other systems are negative.   FAMILY HISTORY:  Significant for cardiovascular disease.   SOCIAL HISTORY:  He had a 30-40 pack year history of smoking.  He quit  10 years ago.  He does drink 4-5 beers daily.   PHYSICAL EXAMINATION:  GENERAL:  The patient is a 60 year old white male  in no acute distress.  He is well-developed and well-nourished.  VITALS:  Blood pressure is 120/82, pulse is 86, respirations are 18, and  his oxygen saturations 96% on room air.  NEUROLOGIC:  He is alert and oriented x3, appropriate and grossly  intact.  HEENT:  Unremarkable.  NECK:  Supple.  There are bruits bilaterally right greater than left.  CARDIAC:  Regular rate and rhythm.  Normal S1 and S2.  No rubs, murmurs,  or gallops.  LUNGS:  Clear with equal breath sounds bilaterally.  ABDOMEN:  Soft and nontender.  EXTREMITIES:  Without clubbing, cyanosis, or edema.  He has 2+ pulses  throughout.   LABORATORY DATA:  Cine is reviewed.  Report from arch and cerebral  arteriography reviewed.   IMPRESSION:  The patient is a 60 year old gentleman, who recently was  found to have severe two-vessel disease.  He also has some involvement  of the small  diagonal branch, but no significant LAD disease.  He has  complex disease in circumflex not amenable to percutaneous intervention.  He has total occlusion of his right coronary.  He does have a positive  stress Myoview and he does have symptomatic exertional dyspnea.  I  discussed with him our options, which would be medical therapy versus  surgery.  Certainly, one of the concerns with this high-grade  bifurcation lesion in the circumflex supplying the  lateral wall with  inferior wall circulation already compromise is that an occlusion at  that area could lead to a much larger heart attack than normal.  We did  discuss that there likely would be some survival benefit, but it might  not reach statistical significance.  However, I do think it would have a  significant impact on his exertional dyspnea.  I discussed with him the  risks and benefits.  He understands the risks include, but are not  limited to death, stroke, myocardial infarction, deep vein thrombosis,  pulmonary embolism, bleeding, possible need for transfusions, infections  as well as other organ system dysfunction including respiratory, renal,  hepatic, or GI complications.  He understands and accepts these risks  and agrees to proceed with surgery.  We have him scheduled for  Wednesday, June 16, 2008, and will be admitted on the day of  surgery.  All the patient's and his wife's questions were answered.   Salvatore Decent Dorris Fetch, M.D.  Electronically Signed   SCH/MEDQ  D:  06/08/2008  T:  06/09/2008  Job:  [id number]

## 2011-02-13 NOTE — Consult Note (Signed)
[name], [name]               ACCOUNT NO.:  [id number]   MEDICAL RECORD NO.:  [id number]          PATIENT TYPE:  OIB   LOCATION:  3731                         FACILITY:  [facility]   PHYSICIAN:  Salvatore Decent. Dorris Fetch, M.D.DATE OF BIRTH:  10/01/1951   DATE OF CONSULTATION:  04/26/2008  DATE OF DISCHARGE:  04/26/2008                                 CONSULTATION   REASON FOR CONSULTATION:  Coronary artery disease with positive  Cardiolite.   HISTORY OF PRESENT ILLNESS:  Mr. Belluomini is a 60 year old gentleman with  multiple cardiac risk factors including hypertension, hyperlipidemia,  previous tobacco abuse, and peripheral arterial disease.  He has a  history of tongue cancer and squamous cell carcinoma treated with  radiation therapy to his neck 7 years ago.  He has had no evidence of  recurrent disease.  He was found on physical examination to have carotid  bruits, and he had carotid Dopplers, which showed a high-grade right  internal carotid stenosis.  He is asymptomatic with no TIA or amaurosis  fugax.  The patient because of his radiation was felt not to be a good  candidate for right carotid endarterectomy and plan was to proceed with  right carotid arterial stenting. As part of his preoperative evaluation,  he was sent to Dr. Nanetta Batty for cardiac clearance.  An EKG showed  a bundle-branch block.  He then had a stress Myoview which showed  inferior ischemia.  Today, he underwent cardiac catheterization, which  showed total occlusion of his right coronary complex, tight lesion in  the bifurcation of his circumflex, and an 80% stenosis in his diagonal.  He also had a tight stenosis at the origin of his left mammary and  moderate stenosis at the origin of his right mammary.  His ejection  fraction was essentially normal.  There was some mild inferior  hypokinesis.   PAST MEDICAL HISTORY:  Significant for,  1. Squamous cell carcinoma of the tongue treated with radiation  therapy.  2. Extracranial carotid occlusive disease.  3. Hypertension.  4. Dyslipidemia.  5. Previous neck surgery.  6. History of tobacco abuse, quit 10 years ago.   CURRENT MEDICATIONS:  1. Amlodipine 10 mg daily.  2. Lisinopril/HCT 20/25 one tablet daily.  3. Pilocarpine 5 mg 4 times daily.  4. Adderall 30 mg t.i.d.  5. Cymbalta 60 mg daily.  6. Bupropion SR 200 mg b.i.d.  7. Plavix 75 mg daily.  8. Aspirin 81 mg daily.   He has allergies to STATINS.   His family history is significant for cardiovascular disease.   SOCIAL HISTORY:  Married.  He does have a history of tobacco abuse for  approximately 30 years, a pack to a pack and a half a day.  He quit 10  years ago.  He drinks 4-5 drinks daily.   REVIEW OF SYSTEMS:  He does complain of shortness of breath with  significant exertion, but not with moderate walking.  No chest tightness  or pressure.  No stroke or TIA symptoms.  No history of excessive  bleeding or bruising.  He had difficulty talking  and swallowing after  being treated with Crestor that has been discontinued.  He had severe  reactions to Lipitor in the past as well.  All other systems are  negative.   PHYSICAL EXAMINATION:  GENERAL:  Mr. Grieco is a 60 year old white male  in no acute distress.  He is well developed and well nourished.  NEUROLOGIC:  He is alert and oriented x3, appropriately and grossly  intact.  HEENT:  Red discoloration of the skin of the neck.  LUNGS:  Clear with equal breath sounds bilaterally.  CARDIAC:  Regular rate and rhythm.  Normal S1 and S2.  There is no  murmur.  He does have a right carotid bruit.  ABDOMEN:  Soft and nontender.  EXTREMITIES:  Without clubbing, cyanosis, or edema.  He has normal Allen  test on the left.   LABORATORY DATA:  Stress Myoview showed severe perfusion defect in the  basal inferior septal, basal inferior, and mid inferior septal, mid  inferior and apical inferior walls.  There was a reversibility.   Ejection fraction was normal.  An echocardiogram showed no valvular  pathology.  Carotid duplex shows critical stenosis in the right internal  carotid.  There is moderate stenosis in the left carotid.  White count  is 4.7, hematocrit 45, and platelets 270.  PT is 11.0, BUN and  creatinine 15 and 1.4.  Urinalysis is negative.  Chest x-ray shows no  acute pulmonary disease.   IMPRESSION:  Mr. Loughner is a 60 year old gentleman who during his  evaluation for treatment of a critical right carotid lesion, had a  positive stress Myoview and now has been found to have significant  coronary artery disease with total occlusion of his right coronary and a  complex bifurcation lesion in his circumflex.  His circumflex lesion is  not particularly amenable to percutaneous intervention, which certainly  be a high-risk percutaneous intervention, and it is really the deciding  factor with a chronically total right and no significant left anterior  descending artery disease.  It certainly has good target for bypass  grafting and that would be the preferred treatment for his coronary  disease.  He is asymptomatic at the present time; however, ideally in  this setting, we would approach him with a combined coronary bypass  grafting and right carotid endarterectomy.  However, as he has had  radiation to the neck and is having a stent placed to avoid incision  into the right neck, a better option in his case could be to proceed  with his right carotid stenting and then have a later interval CABG when  it is safe to take him off of Plavix at least temporarily.  He certainly  has a risk of myocardial infarction in the interim; however, a risk for  stroke if an attempt was made to proceed with coronary artery bypass  grafting first would be very high and probably higher than his risk of  an MI with carotid stenting.   We will discuss the plan with both Dr. Allyson Sabal and Dr. Madilyn Fireman.      Salvatore Decent Dorris Fetch,  M.D.  Electronically Signed     SCH/MEDQ  D:  04/26/2008  T:  04/27/2008  Job:  [id number]   cc:   Nanetta Batty, M.D.  Balinda Quails, M.D.

## 2011-02-13 NOTE — Assessment & Plan Note (Signed)
OFFICE VISIT   AZTLAN, COLL  DOB:  04-18-1951                                        June 24, 2008  CHART #:  [id number]   The patient is a 60 year old gentleman, who recently had coronary bypass  grafting x5 on June 16, 2008.  Postoperatively his course was  uncomplicated and he was discharged home several days ago.  He yesterday  noted a fever of 101.  He also has been having night sweats and feeling  poorly.  He has not had any significant drainage from his sternal  incision or his leg incisions, did notice some redness in his chest tube  sites, has not had a cough.  He has had some difficulty swallowing and  has previously had neck radiation.   PHYSICAL EXAMINATION:  GENERAL:  The patient is a 60 year old white male  in no acute distress.  VITAL SIGNS:  Blood pressure is 108/65, pulse 80, respirations 18,  oxygen saturation 98% on room air, his temperature is 95.6 axillary, he  refused an oral temperature.  HEENT:  He does have thrush in his base of his tongue and posterior  pharynx.  LUNGS:  Slight diminished at the bases, otherwise clear.  No wheezing or  rhonchi.  CHEST:  Sternal incision is clean, dry, and intact.  Sternum is stable.  Chest tube sites have minimal erythema, had the sutures which were  removed.  There is no evidence of infection.  EXTREMITIES:  His leg incision is healing well.   Chest x-ray has improved from June 19, 2008.  There is still small  bilateral effusions, but decreased in size.  No infiltrate.  Sternal  wires are in place.   IMPRESSION:  The patient is now about 8 days out from coronary artery  bypass grafting and feels poorly, it is multifactorial.  He did have a  fever.  I suspected night sweats may be more related to his oxycodone  that he has been taking before he goes to bed at night and was having  night sweats that predated the fever.  His fever has been down today  since he was started  empirically on Keflex yesterday.  He does have  thrush.  I am going to start him on Diflucan 100 mg daily for 5 days,  should take care of that issue.  He is having some difficulty sleeping.  He was taking Ativan in the hospital, so I gave him a prescription for  that, 30 tablets.  Finally, he complains that the Toprol making him feel  poorly.  He had been on it previously a couple years ago and had the  same side effects, so we are going to stop that at this point.  He will  go back to the Norvasc that he was taking prior to admission, but we  should readdress the beta-blocker with him at some point in time once he  is feeling better.  I gave him a prescription for Ultram for p.r.n. use  for the pain.  He has an appointment with [place] on Monday.  If he is not  feeling better, he will return at that point in time.  He does know to  call if he were to have any significant worsening in the interim.   Salvatore Decent Dorris Fetch, M.D.  Electronically Signed  SCH/MEDQ  D:  06/24/2008  T:  06/25/2008  Job:  [id number]   cc:   Balinda Quails, M.D.  Nanetta Batty, M.D.

## 2011-02-13 NOTE — Assessment & Plan Note (Signed)
OFFICE VISIT   FREELAND, PRACHT  DOB:  [birth year]                                        July 15, 2008  CHART #:  [id number]   The patient is a 60 year old gentleman, who had coronary artery bypass  grafting x5 on June 16, 2008.  He has had a difficult postoperative  course.  He had fevers.  He has a lot of difficulty swallowing.  He has  had a previous squamous cell carcinoma treated with radiation.  He was  having some difficulty with aspiration.  He was unable to eat, became  dehydrated, actually had to get admitted to the hospital.  He had  esophagoscopy.  He ultimately had a swallow which showed a trace amount  of aspiration, but he was able to drink Ensure.  He now returns for 1-  month followup visit.  He states that over the past several days, he has  really noticed significant improvement.  He is swallowing better.  He  feels better.  He has more energy.  He still has quite a bit of sternal  pain and cutaneous hypersensitivity as his shirt rubs on his chest, but  he feels like his swallowing has improved.  His overall energy levels  have started to improve as well.   PHYSICAL EXAMINATION:  GENERAL:  The patient is a 60 year old white male  in no acute distress.  VITAL SIGNS:  His blood pressure is 135/95, pulse 93, respirations are  18, his oxygen saturation is 90% on room air.  LUNGS:  Clear with equal breath sounds bilaterally.  CARDIAC:  Regular rate and rhythm.  Normal S1 and S2.  No rubs, murmurs,  or gallops.  CHEST:  His sternum is stable.  Sternal incision is clean, dry, and  intact.  There is no evidence of infection.  EXTREMITIES:  He does have significant tenderness to palpation.  There  is no clicking or popping.  Extremities have no peripheral edema.   His chest x-ray shows good aeration in the lungs bilaterally.  There is  resolution of small bilateral pleural effusions.  There is no effusion  or infiltrates.   IMPRESSION:  The patient is doing well at this point in time.  He  certainly has had a difficult time primarily related to swallowing  issues that has improved dramatically  in the past week.  He has more  energy.  He is still having significant amount of pain.  I gave him  additional prescription for Vicodin 5/325 one to two tablets four times  daily as needed for pain, #60 tablets.  He still has a few left from  this prescription when he left the hospital.  I suspect he will need to  stand on pain medication for several more weeks.  He had questions about  starting cardiac rehab, I have asked him to wait another week or two,  just to make sure that he continues to improve.  I would hate for him to  get the start that too early and have a setback, but overall he is  markedly improved from where he was even a week ago.  He has an  appointment with Dr. Allyson Sabal in 2 weeks.  I will plan to see him back in  about 3 weeks just to check on his progress.   Salvatore Decent  Dorris Fetch, M.D.  Electronically Signed   SCH/MEDQ  D:  07/15/2008  T:  07/16/2008  Job:  [id number]   cc:   Nanetta Batty, M.D.  Jonita Albee, M.D.  Anselmo Rod, M.D.

## 2011-02-13 NOTE — Assessment & Plan Note (Signed)
OFFICE VISIT   [name], [name]  DOB:  [birth year]                                       04/29/2008  EAVWU#:[id number]   Since last seen the patient has undergone cardiac catheterization and  with evidence of significant coronary artery disease will require  coronary artery bypass.   A Doppler has previously shown a severe right internal carotid artery  stenosis, however, MRI was discordant with these findings.  However,  repeat Doppler at this time does reveal evidence of a more moderate  stenosis in the right internal carotid artery.  Antegrade and dampened  right vertebral flow was noted.  There may be a significant stenosis of  right subclavian artery.   PLAN:  At this time is to go ahead and perform a cerebral arteriogram on  05/05/2008 to further evaluate the degree of extracranial  cerebrovascular disease present.  If a severe stenosis of the right  internal carotid artery is identified stenting will be performed.  He  will begin Plavix at this time.   Balinda Quails, M.D.  Electronically Signed   PGH/MEDQ  D:  04/29/2008  T:  04/30/2008  Job:  [id number]   cc:   Pramod P. Pearlean Brownie, MD  Nanetta Batty, M.D.

## 2011-02-15 ENCOUNTER — Other Ambulatory Visit: Payer: Self-pay | Admitting: Internal Medicine

## 2011-02-16 NOTE — H&P (Signed)
NAME:  [name], [name]                         ACCOUNT NO.:  [id number]   MEDICAL RECORD NO.:  [id number]                   PATIENT TYPE:  EMS   LOCATION:  ED                                   FACILITY:  [facility]   PHYSICIAN:  Deanna Artis. Sharene Skeans, M.D.           DATE OF BIRTH:  03/10/1951   DATE OF ADMISSION:  03/29/2004  DATE OF DISCHARGE:                                HISTORY & PHYSICAL   CHIEF COMPLAINT:  Right-sided recurrent numbness.   HISTORY OF PRESENT CONDITION:  A 60 year old right-handed Caucasian married  male with a history of squamous cell carcinoma of the tongue, chronic low  back pain, prior aseptic meningitis, and a recent diagnosis of  hypertension  (made by urgent care medical center). He also has a history of depression.  Four days ago and twice today, the patient had paresthesias of the right  trunk, limbs, and face, lasting one to two minutes, resolving without  residual. The patient presents to the emergency room for evaluation and be  admitted to the [facility] Stroke Service for workup of left brain TIA.   PAST MEDICAL HISTORY:  As above.   PAST SURGICAL HISTORY:  Squamous cell carcinoma of the tongue with biopsy of  the lesion and 3 radiation treatments.   REVIEW OF SYSTEMS:  Decreased stamina since treatment of his cancer. The  patient had significant loss of weight at that time, but has gained 20  pounds in the past year. No fever, infection, rash, known diabetes, or  hypercholesterolemia. The patient does have problems with lumbar  spondylosis, but those have been treated conservatively.   FAMILY HISTORY:  The patient's father had onset of a serious of disabling  strokes in his 43s. He had prior heart disease.   SOCIAL HISTORY:  One pack per day smoker until eight years ago. No alcohol  use. The patient is married and works as  Tree surgeon at [employer]. His wife is in Firefighter at [employer].   PHYSICAL  EXAMINATION:  VITAL SIGNS: Blood pressure 166/85, resting pulse 62,  respirations 18, temperature 97.8. Pulse oximetry 97%.  HEENT: No bruits; no infection; no meningismus.  LUNGS: Clear.  HEART: No murmurs. Pulses  normal.  ABDOMEN: Protuberant, soft, bowel sounds normal.  EXTREMITIES: Normal.  NEUROLOGIC: The patient is awake and alert. No dysphagia or dyspraxia.  Cranial nerves reveal round and reactive pupils. Fundi normal. Visual fields  full to double simultaneous stimuli. Symmetric facial strength. Midline  tongue and uvula. Air conduction greater than bone conduction bilaterally.  On motor examination, normal strength, tone, and mass. Good fine motor  movements. No pronator drift. Sensation reveals no hypesthesia. Normal  stereognosis. Cerebellar exam reveals good finger-to-nose, rapid alternating  movements. Gait is normal. Negative Romberg. Normal heel-toe and tandem.  Deep tendon reflexes are diminished. The patient has bilateral flexor  plantar responses.   IMPRESSION:  1. Transient ischemic  attack, left brain, 435.8. I believe this may involve     either the thalamus or the posterior limb of the internal capsule.  2. Hypertension.   PLAN:  MRI and MRA, 2-D echocardiogram, carotid Doppler, TCD, hemoglobin  A1C, fasting lipid panel, serum homocysteine, enteric-coated aspirin daily,  control blood pressure, and other risk factors.   The patient will be transferred to [facility] from the [facility]  emergency room where he was seen. I have discussed this with the patient and  his wife, and they agree with the plan.                                                Deanna Artis. Sharene Skeans, M.D.    [facility]  D:  03/29/2004  T:  03/29/2004  Job:  [id number]   cc:   [facility]

## 2011-02-16 NOTE — Op Note (Signed)
[place]. [facility]  Patient:    [name], [name]                      MRN: [id number] Proc. Date: 07/23/00 Adm. Date:  [id number] Attending:  Josie Saunders                           Operative Report  PREOPERATIVE DIAGNOSIS:  Herniated cervical disk with C6-7 on the right with radiculopathy, degenerative disk disease and spondylosis.  POSTOPERATIVE DIAGNOSIS:  Herniated cervical disk with C6-7 on the right with radiculopathy, degenerative disk disease and spondylosis.  OPERATION PERFORMED:  Posterior cervical microendoscopic laminectomy and microdiskectomy C6-7 on the right with MetRx system.  SURGEON:  Danae Orleans. Venetia Maxon, M.D.  ASSISTANT:  Hewitt Shorts, M.D.  ANESTHESIA:  General endotracheal.  ESTIMATED BLOOD LOSS:  Minimal.  COMPLICATIONS:  None.  DISPOSITION:  Recovery.  INDICATIONS FOR PROCEDURE:  The patient is a 60 year old man with a C7 radiculopathy on the right with degenerative disk disease at C3-4, C4-5 and C5-6 levels.  It was elected to perform a posterior foraminotomy and microdiskectomy using the microendoscopic MetRx system.  DESCRIPTION OF PROCEDURE:  The patient was brought to the operating room. Following the satisfactory and uncomplicated induction of general endotracheal anesthesia and the placement of intravenous lines, he was placed in a three-pin Mayfield head fixation.  He was then placed in a sitting position, keeping his neck in neutral alignment.  The C-arm was then draped into the field.  The posterior neck was shaved prepped and draped in the usual sterile fashion.  Initially, an incision was made overlying the C6-7 interspace one fingerbreadth off the midline and using a K-wire with C-arm localization, the C6-7 interspace was identified.  Using progressive dilators and through a stab incision, an 18 mm endoscopic tube was placed overlying the interspace at C6-7 on the right.  This was secured to the table  using the standard attachment. The muscle overlying the C6-7 interspace was then cauterized with electrocautery.  The C6-7 interspace was identified and exposed.  Using a Midas Rex drill, an A-2 and subsequently an A-3D burs, the lamina of C6 and the laminae of C7 were drilled down.  Posterior ligamentum flavum was detached and removed.  The C7 nerve root was identified as it coursed out the neural foramen and the neural foramen was also decompressed.  Using microdissection technique, the nerve root was mobilized and the disk space was identified. The disk space was incised with a 11 blade and a small amount of disk material was removed.  The main source of compression was felt to be spondylitic ridge and the nerve root was extensively decompressed but it was not possible to remove a significant amount of disk material.  The nerve root was felt to be well decompressed as well as the spinal cord dura.  There was excellent pulsation in the dura.  Hemostasis was obtained with Gelfoam soaked in Thrombin.  The endoscope was then removed.  The microscope was taken out of the field.  The wound was copiously irrigated with bacitracin and saline.  The deep muscle fascia was then reapproximated with 2-0 Vicryl interrupted sutures and skin edges reapproximated with 3-0 Vicryl subcuticular stitches.  The wound was dressed with benzoin, Steri-Strips, Telfa gauze and tape.  The patient was extubated in the operating room and taken to the recovery room in stable and satisfactory condition having tolerated  the procedure well. DD:  07/23/00 TD:  07/23/00 Job: [id number]

## 2011-02-16 NOTE — Op Note (Signed)
[name], [name]               ACCOUNT NO.:  [id number]   MEDICAL RECORD NO.:  [id number]          PATIENT TYPE:  AMB   LOCATION:  ENDO                         FACILITY:  [facility]   PHYSICIAN:  Anselmo Rod, M.D.  DATE OF BIRTH:  July 06, 1951   DATE OF PROCEDURE:  06/29/2005  DATE OF DISCHARGE:                                 OPERATIVE REPORT   PROCEDURE PERFORMED:  Screening colonoscopy.   ENDOSCOPIST:  Anselmo Rod, M.D.   INSTRUMENT USED:  Olympus video colonoscope.   INDICATIONS FOR PROCEDURE:  A 60 year old white male with a history of  vallecular carcinoma status post treatment undergoing a screening  colonoscopy to rule out colonic polyps, masses, etc.   PREPROCEDURE PREPARATION:  Informed consent was procured from the patient.  The patient was fasted for eight hours prior to the procedure and prepped  with a bottle of magnesium citrate and a gallon of GoLYTELY the night prior  to the procedure.  The risks and benefits of the procedure including a 10%  missed rate of cancer and polyps was discussed with the patient as well.   PREPROCEDURE PHYSICAL:  VITAL SIGNS:  The patient had stable vital signs.  NECK:  Supple.  CHEST: Clear to auscultation.  S1 and S2 regular.  ABDOMEN: Soft with normal bowel sounds.   DESCRIPTION OF PROCEDURE:  The patient was placed in the left lateral  decubitus position, sedated with 100 mg of Demerol and 10 mg of Versed in  slow incremental doses.  Once the patient was adequately sedated and  maintained on low flow oxygen and continuous cardiac monitoring, the Olympus  video colonoscope was advanced from the rectum to the cecum.  The  appendiceal orifice and the ileocecal valve were clearly visualized and  photographed.  There was some residual stool in the right colon and multiple  washings were done.  No masses, polyps, erosions, ulcerations or diverticula  were seen.  Small lesions could be missed.  Retroflexion in the rectum  revealed  no abnormalities.   IMPRESSION:  1.  Normal colonoscopy of the terminal ileum.  No masses, polyps or      diverticula seen.  2.  A large amount of residual stool in the colon.  Multiple washings done.      Small lesions could be missed.   RECOMMENDATIONS:  1.  Continue a high fiber diet with liberal fluid intake.  2.  Repeat colonoscopy in the next 10 years unless the patient develops any      abdominal symptoms in the interim.  3.  Outpatient follow up as need arises in the future.      Anselmo Rod, M.D.  Electronically Signed     JNM/MEDQ  D:  06/29/2005  T:  06/29/2005  Job:  [id number]   cc:   Jonita Albee, M.D.  Fax: [phone]

## 2011-02-16 NOTE — Procedures (Signed)
[place]. [facility]  Patient:    [name], COMMON                      MRN: [id number] Proc. Date: 03/15/00 Adm. Date:  [id number] Disc. Date: [id number] Attending:  [name]                           Procedure Report  DATE OF BIRTH:  [birth year]  REFERRING PHYSICIAN:  PROCEDURE PERFORMED:  Esophagogastroduodenoscopy.  ENDOSCOPIST:  Anselmo Rod, M.D.  INSTRUMENT USED:  Olympus video panendoscope.  INDICATIONS FOR PROCEDURE:  The patient is a 60 year old white male with a hof melenic stools this morning.  Rule out peptic ulcer disease, esophagitis, gastritis, etc.  The patient says he has been consuming over 1200 mg of Motrin a day for his neck pain.  PREPROCEDURE PREPARATION:  Informed consent was procured from the patient. The patient was fasted for eight hours prior to the procedure.  PREPROCEDURE PHYSICAL:  The patient had stable vital signs.  Neck supple. Chest clear to auscultation.  S1, S2 regular.  Abdomen soft with normal abdominal bowel sounds.  DESCRIPTION OF PROCEDURE:  The patient was placed in left lateral decubitus position and sedated with 100 mg of Demerol and 7.5 mg of Versed intravenously.  Once the patient was adequately sedated and maintained on low-flow oxygen and continuous cardiac monitoring, the Olympus video panendoscope was advanced through the mouthpiece, over the tongue, into the esophagus under direct vision.  The proximal esophagus appeared normal but the distal esophagus had evidence of significant esophagitis, no evidence of frank hemorrhage.  On further advancement of the scope into the stomach, there was moderate antral gastritis but no ulcers, erosions, masses or polyps were seen.  There was a small hiatal hernia seen on retroflexion. The duodenal bulb and the small bowel distal to the bulb appeared normal.  There was no outlet obstruction.  The patient tolerated the procedure well without  complications.  IMPRESSION: 1. Distal esophagitis. 2. Antral gastritis. 3. Small hiatal hernia. 4. Normal proximal small bowel.  RECOMMENDATION: 1. Patient has been advised to try Nexium 40 mg 1 p.o. q.d. samples have been    given to him from the office for the next four weeks. 2. In addition to the Nexium, Carafate slurry 1 gm q.i.d. has been prescribed    for the next 15 days. 3. Patient has been advised to avoid all nonsteroidals including ibuprofen and    Motrin.  Vioxx 50 mg per day has been prescribed for the next 30 days. 4. He is to follow up in the office within the next week if his hemoglobin    drops or he has evidence of ongoing melena, colonoscopy may be required.  DD:  03/15/00 TD:  03/20/00 Job: [id number]

## 2011-02-16 NOTE — H&P (Signed)
[employer]. [facility]  Patient:    [name], [name]                      MRN: [id number] Adm. 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 Lesle Chris to my office on July 12, 2000 for neurosurgical consultation regarding severe right arm pain and numbness.  HISTORY OF PRESENT ILLNESS: [name] notes a six month history of problems with his neck and a six week history of severe pain in his neck and pain and numbness in his right arm and index finger, which he describes as annoying and gradually getting worse, waking him up, and states it is quite depressing.  He states it is interfering with his ability to function and perform his normal activities.  He denies any left upper extremity complaints, any lower extremity, or any bowel or bladder dysfunction.  He says that his neck and arm hurt him a great deal and he complains most severely of pain along the right side of his neck and trapezius muscle.  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  He has taken Medrol dospak, Vicodin, and Advil but had an upper GI bleed with Hemoccult positive stools, which was felt to be secondary to use of Advil, and he stopped using anti-inflammatory medications.  [name] underwent an MRI of his cervical spine on June 28, 2000 which showed multi-level spondylosis and foraminal narrowing, with a central disk herniation at C4-5, with mild canal stenosis and significant spondylitic protrusion at the C5-6 level, with bilateral foraminal stenosis.  There was some spondylitic disk degeneration at the C3-4 level  with canal stenosis, but without significant cord compression.  The radiologist felt that the C6-7 level did not have significant disk herniation; however, on my review I believe there is a large foraminal disk herniation at the C6-7 level on the right with some milder spondylitic degenerative changes at that level.  Plain x-rays demonstrate significant spondylitic disease at the C4-5 and C5-6 level, and to a lesser degree the C3-4 level.  The C6-7 level does not appear to have significant spondylitic degeneration.  REVIEW OF SYSTEMS: A detailed Review Of Systems sheet was reviewed with the patient with the following noted pertinent positives.  EYES: He wears glasses. MUSCULOSKELETAL: He notes right arm pain and neck pain.  PSYCHIATRIC: He notes depression, attention deficit disorder, and obsessive-compulsive disorder.  He says these are all well controlled on medications.  All other Review Of Systems negative.  PAST MEDICAL HISTORY:  1. Hiatal hernia.  2. Peptic ulcer disease from summer of 2001.  PAST SURGICAL HISTORY: None.  CURRENT MEDICATIONS:  1. Nexium q.d. for ulcer.  2. Paxil 20 mg q.d. for OCD.  3. Adderall 20 mg b.i.d. for attention deficit disorder.  4. Neurontin 100 mg t.i.d. for pain.  He says this makes him drowsy.  5. Vicodin 5/500 as-needed q.4h to q.6h for pain.  ALLERGIES: No known drug allergies.  FAMILY HISTORY: His mother is a little over  the age of 35, with no significant health problems.  Father is age 60 and is status post CVA.  SOCIAL HISTORY: [name] is a nonsmoker and a daily drinker of alcoholic beverages.  No history of substance abuse.  He works full time in Arts administrator at [employer].  DIAGNOSTIC STUDIES: As above.  PHYSICAL EXAMINATION:  GENERAL: [name] is an uncontrolled appearing midline white male.  VITAL SIGNS: Height 5 feet 7 inches.  Weight 180 pounds.  HEENT: Head normocephalic, atraumatic.  PERRL.  EOMI.  Sclerae  white. Conjunctivae pink.  Oropharynx benign.  Uvula midline.  NECK: No masses, meningismus, deformities, tracheal deviation, jugular venous distention, or carotid bruits.  He has decreased cervical range of motion with significantly increased pain in his right upper extremity with extension and a markedly positive Spurling maneuver to the right with reproduction of radicular pain with turning his head to the right.  Negative Spurling maneuver to the left.  Negative Lhermittes sign with axial compression.  He notes pain along the medial border of the scapula on the right to palpation and has significant pain to palpation over the trapezius muscle on the right.  RESPIRATORY: Normal respiratory effort with good intercostal function.  Lungs clear to auscultation.  No rales, rhonchi, or wheezes.  CARDIOVASCULAR: Heart has regular rate and rhythm to auscultation.  No murmurs are appreciated.  ABDOMEN: Soft, nontender.  No hepatosplenomegaly appreciated.  No masses appreciated.  Active bowel sounds.  No rebound or guarding.  EXTREMITIES: There is no extremity clubbing, cyanosis, or edema.  There are palpable pedal pulses.  MUSCULOSKELETAL: The patient is able to walk about the examining room with a normal heel/toe, causal gait.  He has negative shoulder impingement testing bilaterally.  NEUROLOGIC: The patient is oriented to time, person, and place.  He has good recall of both recent and remote memory, with normal attention span and concentration.  The patient speaks with clear and fluent speech and exhibits normal language function and appropriate fund of knowledge.  Cranial nerve examination showed the pupils are equal, round, and reactive to light. Extraocular movements were full.  Visual fields are full to confrontational testing.  Facial sensation and facial motor are intact and symmetric.  Hearing is intact to finger rub.  Palate is upgoing.  Shoulder shrug is symmetric. Tongue  protrudes in the midline.  Motor examination shows motor strength is 5/5 in the bilateral deltoids, hand grips, wrist extensors, interossei; 5/5 in  the left triceps with the exception of 4-/5 right triceps strength.  Lower extremity strength is full in all motor groups bilaterally and symmetric. Sensory examination is normal to light touch and pinprick sensation in the upper and lower extremities with the exception of decreased pinprick sensation in the right C7 distribution including the second and third digit.  Deep tendon reflexes are 2 in the biceps bilaterally and symmetric, trace at the triceps, 2 in the left triceps; 2 in the brachial radialis bilaterally and symmetric; knee jerks 2; ankle jerks 2.  Great toes are downgoing to plantar stimulation.  Cerebellar examination is normal.  Coordination in the upper and lower extremities normal with rapid alternating movements.  Romberg test negative.  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 He has significant cervical spondylitic disease at multiple levels in his neck but appears to have an acute cervical radiculopathy secondary to foraminal disk herniation at the C6-7 level on the  left.  I do believe he will require surgical intervention.  He essentially has significant degenerative disk disease at four levels in his neck but has a radiculopathy because of foraminal disk herniation at C6-7 level on the right.  I have recommended the other spondylitic and degenerated levels not be addressed surgically but that he underwent posterior cervical foraminotomy and microdiskectomy at C6-7 on the right.  I recommended this be performed using microendoscopic technique using the [facility] system.  He is aware of the potential risks of surgery including failure to relieve his pain, possible weakness and/or arm pain, need for further  surgical intervention, realization that he has significant spondylitic disease at other levels in his neck which may eventually require surgical intervention, potential for bleeding, damage to nerves, damage to blood vessels, paralysis, spinal cord injury.  He understands these risk and wishes to proceed with surgery.  He is also aware that I perform this procedure in a sitting position and he will need a central venous catheter, and is aware of the risk of air embolism associated intraoperatively.  He wished to go ahead with surgery and this has been set up for July 23, 2000. He was given a prescription for Percocet to help with pain. DD:  07/23/00 TD:  07/23/00 Job: [id number]

## 2011-02-16 NOTE — Consult Note (Signed)
[name], [name]               ACCOUNT NO.:  [id number]   MEDICAL RECORD NO.:  [id number]          PATIENT TYPE:  INP   LOCATION:  2899                         FACILITY:  [facility]   PHYSICIAN:  Marin Roberts, MDDATE OF BIRTH:  1951/04/16   DATE OF CONSULTATION:  DATE OF DISCHARGE:                                 CONSULTATION   REFERRING PHYSICIAN:  Balinda Quails, MD   REASON FOR CONSULTATION:  Intracranial interpretation of four-vessel  cerebral arteriogram.   FINDINGS:  Right common carotid:  Two planes are provided.  Flow was  predominately to the middle cerebral artery.  There is bidirectional  flow to the right A1 segment suggesting a proximal stenosis.  There is  some filling of the lower A2 branches.  No definite proximal  intracranial stenosis, aneurysm, or occlusion is seen.  Studies somewhat  limited by patient's motion and subtraction artifact.   Left common carotid:  This is a more robust injection.  There is no  cross filling of the anterior communicating artery.  More distal A2  branches do fill on the right side from this injection.   Right vertebral artery:  The right vertebral artery was canulated.  An  injection demonstrates predominantly extracranial opacification.  The  vertebral artery appears to terminate at the C1 level and does not enter  the foramen.  This is again somewhat obscured by subtraction artifacts.  There are some dural branches that feed via transcalvarial branches.   Left vertebral artery:  Injection of the left vertebral artery  demonstrates normal opacification of the basilar artery.  There is no  reflux into the distal right vertebral artery.  The left superior  cerebellar artery is not well seen on this injection.  There may be a  proximal occlusion of that artery.  Posterior cerebral arteries fill  bilaterally from this injection.  There is slow retrograde flash filling  of a left posterior communicating artery.  No significant  aneurysms are  seen.   IMPRESSION:  1. Four-vessel cerebral arteriogram demonstrates no significant      proximal stenosis, aneurysm, or branch vessel occlusion.  2. There is slow filling of the anterior cerebral artery territory on      the right side, suggesting a significant proximal stenosis.  3. There is no anterior communicating artery to allow the left side to      feed the proximal anterior cerebral artery territory on the right.  4. The right vertebral artery terminates in muscular branches and does      not enter the foramen magnum.  Some of these branches eventually      became dural branches via transcalvarial routes.  5. Possible occlusion or significant narrowing of the left superior      cerebellar artery.      Marin Roberts, MD  Electronically Signed     CM/MEDQ  D:  05/09/2008  T:  05/09/2008  Job:  [id number]

## 2011-02-20 ENCOUNTER — Ambulatory Visit (INDEPENDENT_AMBULATORY_CARE_PROVIDER_SITE_OTHER): Payer: Commercial Managed Care - PPO | Admitting: Internal Medicine

## 2011-02-20 ENCOUNTER — Encounter: Payer: Self-pay | Admitting: Internal Medicine

## 2011-02-20 VITALS — BP 140/88 | HR 68 | Temp 97.6°F | Wt 165.0 lb

## 2011-02-20 DIAGNOSIS — H6120 Impacted cerumen, unspecified ear: Secondary | ICD-10-CM

## 2011-02-20 NOTE — Progress Notes (Signed)
  Subjective:    Patient ID: [name], male    DOB: 1950-11-14, 60 y.o.   MRN: [id number]  HPI   Pt feels like water in left ear.  Felt pressure in left ear and was concerned because hx of squamous cell CA on tongue had similar presentation.    Review of Systems     Objective:   Physical Exam  HENT:  Right Ear: External ear normal.  Mouth/Throat: Oropharynx is clear and moist.       Cerumen in left ear canal removed with currette. Left TM visible after cerumen removed. Appears to be normal. Tongue and cheek appear normal          Assessment & Plan:  1- Cerumen left external ear canal- removed 2-Hx squamous cell Ca of tongue treated at [facility] Plan Pt advised to make appt with Dr. Valla Leaver at [facility] who has seen him before for oral exam.

## 2011-02-20 NOTE — Patient Instructions (Signed)
Call if symptoms persist.  Advise appt with Dr. Valla Leaver at [facility]- number given to pt.

## 2011-03-09 ENCOUNTER — Other Ambulatory Visit: Payer: Self-pay | Admitting: Internal Medicine

## 2011-03-09 MED ORDER — CYCLOBENZAPRINE HCL 10 MG PO TABS: ORAL_TABLET | ORAL | Status: DC

## 2011-03-09 NOTE — Telephone Encounter (Signed)
Ok per Dr. Lenord Fellers to send rx for Flexeril 10 mg 1/2- 1 tablet tid prn

## 2011-03-19 ENCOUNTER — Other Ambulatory Visit: Payer: Self-pay | Admitting: Internal Medicine

## 2011-04-03 ENCOUNTER — Encounter: Payer: Self-pay | Admitting: Internal Medicine

## 2011-04-03 ENCOUNTER — Ambulatory Visit (INDEPENDENT_AMBULATORY_CARE_PROVIDER_SITE_OTHER): Payer: Commercial Managed Care - PPO | Admitting: Internal Medicine

## 2011-04-03 DIAGNOSIS — Z8673 Personal history of transient ischemic attack (TIA), and cerebral infarction without residual deficits: Secondary | ICD-10-CM

## 2011-04-03 DIAGNOSIS — H02402 Unspecified ptosis of left eyelid: Secondary | ICD-10-CM

## 2011-04-03 DIAGNOSIS — I10 Essential (primary) hypertension: Secondary | ICD-10-CM

## 2011-04-03 DIAGNOSIS — F32A Depression, unspecified: Secondary | ICD-10-CM

## 2011-04-03 DIAGNOSIS — I2510 Atherosclerotic heart disease of native coronary artery without angina pectoris: Secondary | ICD-10-CM

## 2011-04-03 DIAGNOSIS — M545 Low back pain, unspecified: Secondary | ICD-10-CM

## 2011-04-03 DIAGNOSIS — F988 Other specified behavioral and emotional disorders with onset usually occurring in childhood and adolescence: Secondary | ICD-10-CM

## 2011-04-03 DIAGNOSIS — I6529 Occlusion and stenosis of unspecified carotid artery: Secondary | ICD-10-CM

## 2011-04-03 DIAGNOSIS — F329 Major depressive disorder, single episode, unspecified: Secondary | ICD-10-CM

## 2011-04-03 DIAGNOSIS — C029 Malignant neoplasm of tongue, unspecified: Secondary | ICD-10-CM

## 2011-04-03 DIAGNOSIS — M47812 Spondylosis without myelopathy or radiculopathy, cervical region: Secondary | ICD-10-CM

## 2011-04-03 DIAGNOSIS — K219 Gastro-esophageal reflux disease without esophagitis: Secondary | ICD-10-CM

## 2011-04-03 DIAGNOSIS — E785 Hyperlipidemia, unspecified: Secondary | ICD-10-CM

## 2011-04-03 DIAGNOSIS — H02409 Unspecified ptosis of unspecified eyelid: Secondary | ICD-10-CM

## 2011-04-10 ENCOUNTER — Other Ambulatory Visit: Payer: Self-pay

## 2011-04-10 ENCOUNTER — Emergency Department (HOSPITAL_BASED_OUTPATIENT_CLINIC_OR_DEPARTMENT_OTHER)
Admission: EM | Admit: 2011-04-10 | Discharge: 2011-04-10 | Disposition: A | Discharge status: Home / Self Care | Payer: Commercial Managed Care - PPO | Attending: Emergency Medicine | Admitting: Emergency Medicine

## 2011-04-10 ENCOUNTER — Encounter (HOSPITAL_BASED_OUTPATIENT_CLINIC_OR_DEPARTMENT_OTHER): Payer: Self-pay | Admitting: *Deleted

## 2011-04-10 DIAGNOSIS — M545 Low back pain, unspecified: Secondary | ICD-10-CM | POA: Insufficient documentation

## 2011-04-10 DIAGNOSIS — Z951 Presence of aortocoronary bypass graft: Secondary | ICD-10-CM | POA: Insufficient documentation

## 2011-04-10 DIAGNOSIS — F411 Generalized anxiety disorder: Secondary | ICD-10-CM | POA: Insufficient documentation

## 2011-04-10 DIAGNOSIS — R42 Dizziness and giddiness: Secondary | ICD-10-CM | POA: Insufficient documentation

## 2011-04-10 DIAGNOSIS — I2510 Atherosclerotic heart disease of native coronary artery without angina pectoris: Secondary | ICD-10-CM | POA: Insufficient documentation

## 2011-04-10 DIAGNOSIS — I10 Essential (primary) hypertension: Secondary | ICD-10-CM | POA: Insufficient documentation

## 2011-04-10 DIAGNOSIS — Z79899 Other long term (current) drug therapy: Secondary | ICD-10-CM | POA: Insufficient documentation

## 2011-04-10 HISTORY — DX: Major depressive disorder, single episode, unspecified: F32.9

## 2011-04-10 LAB — RAPID URINE DRUG SCREEN, HOSP PERFORMED
Barbiturates: NOT DETECTED
Cocaine: NOT DETECTED

## 2011-04-10 LAB — CBC
Hemoglobin: 16.1 g/dL (ref 13.0–17.0)
MCH: 31.3 pg (ref 26.0–34.0)
MCV: 86.8 fL (ref 78.0–100.0)
RBC: 5.14 MIL/uL (ref 4.22–5.81)

## 2011-04-10 LAB — COMPREHENSIVE METABOLIC PANEL
ALT: 86 U/L — ABNORMAL HIGH (ref 0–53)
Alkaline Phosphatase: 58 U/L (ref 39–117)
CO2: 24 mEq/L (ref 19–32)
Calcium: 9.1 mg/dL (ref 8.4–10.5)
Creatinine, Ser: 0.9 mg/dL (ref 0.50–1.35)
GFR calc Af Amer: >60 mL/min (ref >60)
GFR calc non Af Amer: >60 mL/min (ref >60)
Glucose, Bld: 129 mg/dL — ABNORMAL HIGH (ref 70–99)
Potassium: 3.8 mEq/L (ref 3.5–5.1)
Sodium: 139 mEq/L (ref 135–145)

## 2011-04-10 MED ORDER — LORAZEPAM 2 MG/ML IJ SOLN
1.0000 mg | Freq: Once | INTRAMUSCULAR | Status: AC
  Administered 2011-04-10: 2 mg via INTRAVENOUS
  Filled 2011-04-10: qty 1

## 2011-04-10 MED ORDER — LORAZEPAM 1 MG PO TABS: 1.0000 mg | ORAL_TABLET | Freq: Three times a day (TID) | ORAL | Status: AC | PRN

## 2011-04-10 NOTE — ED Notes (Signed)
Pt called ems due to extreme dizziness, anxious and disoriented.  States he is having medication changes for his major depressive disorder.  PCP is [facility].

## 2011-04-10 NOTE — ED Provider Notes (Signed)
History     Chief Complaint  Patient presents with  . Dizziness    Pt states he woke up this feeling disoriented, dizzy and anxious.  States he has major depressive disorder and has had recent changes in his psych medications.  States now he is feeling very anxious.  Denies pain.  EMS bp 206/100 p 104.CBG 129,.   HPI Pt states he is feeling very anxious- awoke this morning and felt that he was in a dream state.  Felt that he was going to die because he couldn't wake up.   + sob, no chest pain.  Pt very tearful upon describing his feelings. Pt has recently been weaning down on one medication and increasing prozac. Was also started on vicodin and prednisone for low back pain several days ago.  No fever, no cough.    Past Medical History  Diagnosis Date  . CAD (coronary artery disease)   . Squamous cell cancer of tongue   . HTN (hypertension), benign   . Depression, major     Past Surgical History  Procedure Date  . Spine surgery 2000    c-spine disc/Kritzer  . Coronary artery bypass graft 2009    x5  . Unknown     Family History  Problem Relation Age of Onset  . Cancer Mother     ovarian  . Stroke Father   . Diabetes Father   . Arthritis Father   . Hypertension Brother     History  Substance Use Topics  . Smoking status: Former Smoker -- 3.0 packs/day for 30 years    Types: Cigarettes    Quit date: 01/29/2001  . Smokeless tobacco: Not on file  . Alcohol Use: 4.2 oz/week    7 Cans of beer per week      Review of SystemsROS reviewed and otherwise negative except for mentioned in HPI   Physical Exam  BP 127/103  Pulse 88  Temp(Src) 98.2 F (36.8 C) (Oral)  Resp 24  SpO2 94%  Physical Exam  Vitals reviewed. Gen- awake, alert, NAD Eyes- PERRL, EOMI, normal appearance HEENT- MMM, OP clear Neck- supple, from Lungs- CTAB, breath sounds symmetric, no accessory muscle movement or dyspnea CV-RRR, no murmur/rub/gallop Abd- soft, nontender, nondistended, normal  active bowel sounds GU- no CVA tenderness Ext- no peripheral edema, neurovascularly intact, 2+ dp pulses, brisk cap refill Neuro- alert and oriented x 3, extremities with full strength, no decrease in sensation Psych- anxious appearing, tearful         ED Course  Procedures Pt with mild elevation in AST/ALT- discussed with patient- they will have this rechecked with PMD Pt with much less anxiety after ativan- feels calm   Date: 04/10/2011  Rate: 92  Rhythm: sinus rhythm with occasional PVCs  QRS Axis: right  Intervals: normal  ST/T Wave abnormalities: indeterminate  Conduction Disutrbances:right bundle branch block  Narrative Interpretation:   Old EKG Reviewed: none available   MDM Anxiety controlled after ativan.  Discussed lab results with patient and wife.  Recommended that he contact Dr. Evelene Croon, psych about his weaning schedule between the other med And prozac.  Denies SI/HI. Discharged with strict return precautions.  Pt agreeable with plan.       Ethelda Chick, MD 04/10/11 [phone]

## 2011-04-10 NOTE — ED Notes (Signed)
These vital signs done by D. Cole EMT.

## 2011-04-10 NOTE — ED Notes (Signed)
I responded to call light, patient's wife at bedside. Patient pulled iv. out by accident when he removed his gown, getting blood on self and urinal. Patient still unable to give urine sample. i notified nurse of need to replace iv. And need for labs and urine still.

## 2011-04-10 NOTE — ED Notes (Signed)
Patient is sleeping and resting comfortable.  Spouse at bedside.

## 2011-04-10 NOTE — ED Notes (Signed)
IV dc'd from left forearm.  Tip intact, site unremarkable.

## 2011-04-10 NOTE — ED Notes (Deleted)
Pt reports onset of SOB and dizzeness yesterday, reports being on 2L [place] at all times. Facial swelling noted and peripheral edema noted on extremities and face. Airway patent and pt is labouring with breathing. No cyanosis noted. + N V CP SOB and increased SOB with exertion and no relief with rest. Pt reports hx of COPD. o2 sats in room on 2L at 59%, pt placed on 6L  and sating at 94%, pt reports cough and tenacious secretions, + wheezing at present time. Pt also reports pain 10/10 in lower back and radiating to center of chest.

## 2011-04-12 ENCOUNTER — Telehealth: Payer: Self-pay | Admitting: *Deleted

## 2011-04-12 NOTE — Telephone Encounter (Signed)
Instructed patient to finish Prednisone as prescribed, rest and use heat for comfort per Dr. Lenord Fellers.  Stated pt could call office Monday if symptoms do not improve over the weekend.  Pt verbalized understanding.

## 2011-04-12 NOTE — Telephone Encounter (Signed)
Pt called concerned that there is no improvement in his back pain.  He still has two days left on his prednisone.  Would like recommendations.

## 2011-04-13 ENCOUNTER — Other Ambulatory Visit: Payer: Self-pay

## 2011-04-13 MED ORDER — HYDROCODONE-ACETAMINOPHEN 5-500 MG PO TABS: 1.0000 | ORAL_TABLET | Freq: Four times a day (QID) | ORAL | Status: AC | PRN

## 2011-04-16 ENCOUNTER — Other Ambulatory Visit: Payer: Self-pay | Admitting: Internal Medicine

## 2011-04-16 NOTE — Telephone Encounter (Signed)
Pt called concerned that he is still having back pain after finishing Prednisone.  Pt stated he is willing to try physical therapy if this is an option.

## 2011-04-16 NOTE — Telephone Encounter (Signed)
Written order for PT given. See if no better after 2- 3 weeks of therapy. Decline refill on Prednisone at this time.

## 2011-04-17 ENCOUNTER — Telehealth: Payer: Self-pay | Admitting: *Deleted

## 2011-04-17 NOTE — Telephone Encounter (Signed)
Called patient to let him know we have an order for him to go to physical therapy.  He said he will pick it up later today.

## 2011-04-25 ENCOUNTER — Ambulatory Visit: Payer: 59 | Attending: Internal Medicine | Admitting: Physical Therapy

## 2011-04-25 DIAGNOSIS — IMO0001 Reserved for inherently not codable concepts without codable children: Secondary | ICD-10-CM | POA: Insufficient documentation

## 2011-04-25 DIAGNOSIS — M545 Low back pain, unspecified: Secondary | ICD-10-CM | POA: Insufficient documentation

## 2011-04-25 DIAGNOSIS — M542 Cervicalgia: Secondary | ICD-10-CM | POA: Insufficient documentation

## 2011-04-29 ENCOUNTER — Encounter: Payer: Self-pay | Admitting: Internal Medicine

## 2011-04-29 DIAGNOSIS — K219 Gastro-esophageal reflux disease without esophagitis: Secondary | ICD-10-CM | POA: Insufficient documentation

## 2011-04-29 DIAGNOSIS — Z8673 Personal history of transient ischemic attack (TIA), and cerebral infarction without residual deficits: Secondary | ICD-10-CM | POA: Insufficient documentation

## 2011-04-29 DIAGNOSIS — I2510 Atherosclerotic heart disease of native coronary artery without angina pectoris: Secondary | ICD-10-CM | POA: Insufficient documentation

## 2011-04-29 DIAGNOSIS — C029 Malignant neoplasm of tongue, unspecified: Secondary | ICD-10-CM | POA: Insufficient documentation

## 2011-04-29 DIAGNOSIS — I10 Essential (primary) hypertension: Secondary | ICD-10-CM | POA: Insufficient documentation

## 2011-04-29 DIAGNOSIS — M47812 Spondylosis without myelopathy or radiculopathy, cervical region: Secondary | ICD-10-CM | POA: Insufficient documentation

## 2011-04-29 DIAGNOSIS — H02402 Unspecified ptosis of left eyelid: Secondary | ICD-10-CM | POA: Insufficient documentation

## 2011-04-29 DIAGNOSIS — F988 Other specified behavioral and emotional disorders with onset usually occurring in childhood and adolescence: Secondary | ICD-10-CM | POA: Insufficient documentation

## 2011-04-29 DIAGNOSIS — F329 Major depressive disorder, single episode, unspecified: Secondary | ICD-10-CM | POA: Insufficient documentation

## 2011-04-29 DIAGNOSIS — E785 Hyperlipidemia, unspecified: Secondary | ICD-10-CM | POA: Insufficient documentation

## 2011-04-29 DIAGNOSIS — I6529 Occlusion and stenosis of unspecified carotid artery: Secondary | ICD-10-CM | POA: Insufficient documentation

## 2011-04-29 NOTE — Progress Notes (Signed)
  Subjective:    Patient ID: [name], male    DOB: 06/25/1951, 60 y.o.   MRN: [id number]  HPI he was doing some work recently under a car and subsequently developed back pain a few days ago. Says pain has been fairly severe nearly constant. Pain is in the left lower back area.      Review of Systems     Objective:   Physical Exam straight leg raising on the left is negative palpable tenderness left para lumbar area. Deep tendon reflexes 2+ and symmetrical in the knees muscle strength in the left lower extremity is normal        Assessment & Plan:   Low back strain  Plan is to give a Sterapred DS 10 mg 6 day dosepak. May try Flexeril 10 mg 1/2-1 by mouth at bedtime. Vicodin 5/500 one by mouth every 6 hours when necessary pain (#40) call if not better in one week

## 2011-05-01 ENCOUNTER — Ambulatory Visit: Payer: 59 | Admitting: Physical Therapy

## 2011-05-03 ENCOUNTER — Ambulatory Visit: Payer: 59 | Attending: Internal Medicine | Admitting: Physical Therapy

## 2011-05-03 DIAGNOSIS — M545 Low back pain, unspecified: Secondary | ICD-10-CM | POA: Insufficient documentation

## 2011-05-03 DIAGNOSIS — M542 Cervicalgia: Secondary | ICD-10-CM | POA: Insufficient documentation

## 2011-05-03 DIAGNOSIS — IMO0001 Reserved for inherently not codable concepts without codable children: Secondary | ICD-10-CM | POA: Insufficient documentation

## 2011-05-09 ENCOUNTER — Ambulatory Visit: Payer: 59 | Admitting: Physical Therapy

## 2011-05-11 ENCOUNTER — Ambulatory Visit: Payer: 59 | Admitting: Physical Therapy

## 2011-06-05 ENCOUNTER — Ambulatory Visit: Payer: Commercial Managed Care - PPO | Attending: Internal Medicine | Admitting: Physical Therapy

## 2011-06-05 DIAGNOSIS — IMO0001 Reserved for inherently not codable concepts without codable children: Secondary | ICD-10-CM | POA: Insufficient documentation

## 2011-06-05 DIAGNOSIS — M542 Cervicalgia: Secondary | ICD-10-CM | POA: Insufficient documentation

## 2011-06-05 DIAGNOSIS — M545 Low back pain, unspecified: Secondary | ICD-10-CM | POA: Insufficient documentation

## 2011-06-07 ENCOUNTER — Ambulatory Visit: Payer: Commercial Managed Care - PPO | Admitting: Rehabilitation

## 2011-06-12 ENCOUNTER — Ambulatory Visit: Payer: Commercial Managed Care - PPO | Admitting: Rehabilitation

## 2011-06-14 ENCOUNTER — Ambulatory Visit: Payer: Commercial Managed Care - PPO | Admitting: Rehabilitation

## 2011-06-14 ENCOUNTER — Encounter: Payer: Self-pay | Admitting: Internal Medicine

## 2011-06-14 ENCOUNTER — Ambulatory Visit (INDEPENDENT_AMBULATORY_CARE_PROVIDER_SITE_OTHER): Payer: Commercial Managed Care - PPO | Admitting: Internal Medicine

## 2011-06-14 VITALS — BP 128/78 | HR 82 | Temp 97.9°F | Wt 166.0 lb

## 2011-06-14 DIAGNOSIS — N453 Epididymo-orchitis: Secondary | ICD-10-CM

## 2011-06-14 DIAGNOSIS — N451 Epididymitis: Secondary | ICD-10-CM

## 2011-06-14 NOTE — Progress Notes (Signed)
  Subjective:    Patient ID: [name], male    DOB: [birth year], 60 y.o.   MRN: [id number]  HPI patient has been doing some heavy lifting recently. He was trying to sharp and blades on a lawnmower. About 3 weeks ago noticed some pain in his right groin and testicle. This has persisted. Says initially right groin appeared to be a bit swollen but now right scrotum appears to be swollen. Has tenderness in that area. No fever or chills.    Review of Systems     Objective:   Physical Exam appears to have swelling right scrotum, possible hydrocele. Very tender epididymis on right. I cannot appreciate a hernia to direct palpation on the right. Both testicle and epididymis on the right are tender.        Assessment & Plan:  Epididymitis  Rule out torsion of the testicle/inguinal hernia  Prescription for doxycycline 100 mg twice daily for 10 days  Have arranged for urologist to see him this afternoon

## 2011-06-19 ENCOUNTER — Ambulatory Visit: Payer: Commercial Managed Care - PPO | Admitting: Physical Therapy

## 2011-06-21 ENCOUNTER — Ambulatory Visit: Payer: Commercial Managed Care - PPO | Admitting: Physical Therapy

## 2011-06-26 ENCOUNTER — Other Ambulatory Visit: Payer: Commercial Managed Care - PPO | Admitting: Internal Medicine

## 2011-06-26 ENCOUNTER — Ambulatory Visit: Payer: Commercial Managed Care - PPO | Admitting: Physical Therapy

## 2011-06-26 DIAGNOSIS — Z0000 Encounter for general adult medical examination without abnormal findings: Secondary | ICD-10-CM

## 2011-06-26 LAB — COMPREHENSIVE METABOLIC PANEL
AST: 28 U/L (ref 0–37)
Albumin: 4.4 g/dL (ref 3.5–5.2)
Alkaline Phosphatase: 55 U/L (ref 39–117)
BUN: 13 mg/dL (ref 6–23)
Potassium: 3.7 mEq/L (ref 3.5–5.3)
Total Bilirubin: 0.5 mg/dL (ref 0.3–1.2)

## 2011-06-26 LAB — LIPID PANEL
HDL: 68 mg/dL (ref >39)
LDL Cholesterol: 116 mg/dL — ABNORMAL HIGH (ref 0–99)
Total CHOL/HDL Ratio: 3.2 Ratio
VLDL: 32 mg/dL (ref 0–40)

## 2011-06-26 LAB — CBC WITH DIFFERENTIAL/PLATELET
Basophils Relative: 1 % (ref 0–1)
Eosinophils Absolute: 0.2 10*3/uL (ref 0.0–0.7)
HCT: 47 % (ref 39.0–52.0)
Hemoglobin: 16 g/dL (ref 13.0–17.0)
MCH: 31.7 pg (ref 26.0–34.0)
MCHC: 34 g/dL (ref 30.0–36.0)
MCV: 93.3 fL (ref 78.0–100.0)
Monocytes Absolute: 0.6 10*3/uL (ref 0.1–1.0)
Monocytes Relative: 13 % — ABNORMAL HIGH (ref 3–12)

## 2011-06-27 LAB — BUN: BUN: 8

## 2011-06-27 LAB — CREATININE, SERUM: GFR calc non Af Amer: >60

## 2011-06-28 ENCOUNTER — Ambulatory Visit (INDEPENDENT_AMBULATORY_CARE_PROVIDER_SITE_OTHER): Payer: Commercial Managed Care - PPO | Admitting: Internal Medicine

## 2011-06-28 ENCOUNTER — Ambulatory Visit: Payer: Commercial Managed Care - PPO | Admitting: Physical Therapy

## 2011-06-28 ENCOUNTER — Encounter: Payer: Self-pay | Admitting: Internal Medicine

## 2011-06-28 VITALS — BP 126/82 | HR 72 | Temp 97.2°F | Ht 67.5 in | Wt 164.0 lb

## 2011-06-28 DIAGNOSIS — G902 Horner's syndrome: Secondary | ICD-10-CM

## 2011-06-28 DIAGNOSIS — I779 Disorder of arteries and arterioles, unspecified: Secondary | ICD-10-CM

## 2011-06-28 DIAGNOSIS — F329 Major depressive disorder, single episode, unspecified: Secondary | ICD-10-CM

## 2011-06-28 DIAGNOSIS — F411 Generalized anxiety disorder: Secondary | ICD-10-CM

## 2011-06-28 DIAGNOSIS — G909 Disorder of the autonomic nervous system, unspecified: Secondary | ICD-10-CM

## 2011-06-28 DIAGNOSIS — M549 Dorsalgia, unspecified: Secondary | ICD-10-CM

## 2011-06-28 DIAGNOSIS — Z23 Encounter for immunization: Secondary | ICD-10-CM

## 2011-06-28 DIAGNOSIS — Z2911 Encounter for prophylactic immunotherapy for respiratory syncytial virus (RSV): Secondary | ICD-10-CM

## 2011-06-28 DIAGNOSIS — I10 Essential (primary) hypertension: Secondary | ICD-10-CM

## 2011-06-28 DIAGNOSIS — C029 Malignant neoplasm of tongue, unspecified: Secondary | ICD-10-CM

## 2011-06-28 DIAGNOSIS — M5090 Cervical disc disorder, unspecified, unspecified cervical region: Secondary | ICD-10-CM

## 2011-06-28 DIAGNOSIS — I2510 Atherosclerotic heart disease of native coronary artery without angina pectoris: Secondary | ICD-10-CM

## 2011-06-28 DIAGNOSIS — N529 Male erectile dysfunction, unspecified: Secondary | ICD-10-CM

## 2011-06-28 DIAGNOSIS — Z8673 Personal history of transient ischemic attack (TIA), and cerebral infarction without residual deficits: Secondary | ICD-10-CM

## 2011-06-28 DIAGNOSIS — E785 Hyperlipidemia, unspecified: Secondary | ICD-10-CM

## 2011-06-28 DIAGNOSIS — F419 Anxiety disorder, unspecified: Secondary | ICD-10-CM

## 2011-06-28 DIAGNOSIS — F3289 Other specified depressive episodes: Secondary | ICD-10-CM

## 2011-06-29 LAB — COMPREHENSIVE METABOLIC PANEL
ALT: 29
AST: 23
Albumin: 3.7
Alkaline Phosphatase: 70
BUN: 12
Chloride: 102
Potassium: 3.2 — ABNORMAL LOW
Sodium: 138
Total Bilirubin: 0.7
Total Protein: 6.2

## 2011-06-29 LAB — CBC
HCT: 46.2
HCT: 43.3
Hemoglobin: 15.9
Platelets: 261
Platelets: 275
RDW: 13.1
RDW: 13.2
WBC: 5.6
WBC: 4.4

## 2011-06-29 LAB — BASIC METABOLIC PANEL
BUN: 12
CO2: 30
Chloride: 101
Creatinine, Ser: 1.05
Glucose, Bld: 111 — ABNORMAL HIGH

## 2011-06-29 LAB — APTT: aPTT: 28

## 2011-06-29 LAB — PROTIME-INR: INR: 0.9

## 2011-07-02 ENCOUNTER — Encounter: Payer: Self-pay | Admitting: Internal Medicine

## 2011-07-02 DIAGNOSIS — F419 Anxiety disorder, unspecified: Secondary | ICD-10-CM | POA: Insufficient documentation

## 2011-07-02 DIAGNOSIS — M549 Dorsalgia, unspecified: Secondary | ICD-10-CM | POA: Insufficient documentation

## 2011-07-02 DIAGNOSIS — M5090 Cervical disc disorder, unspecified, unspecified cervical region: Secondary | ICD-10-CM | POA: Insufficient documentation

## 2011-07-02 DIAGNOSIS — N529 Male erectile dysfunction, unspecified: Secondary | ICD-10-CM | POA: Insufficient documentation

## 2011-07-02 LAB — POCT I-STAT 3, ART BLOOD GAS (G3+)
Acid-base deficit: 1
Acid-base deficit: 1
Bicarbonate: 24.2 — ABNORMAL HIGH
Bicarbonate: 24.9 — ABNORMAL HIGH
Bicarbonate: 25 — ABNORMAL HIGH
O2 Saturation: 100
O2 Saturation: 95
O2 Saturation: 98
Patient temperature: 36.4
Patient temperature: 36.7
TCO2: 25
TCO2: 26
pCO2 arterial: 38.7
pCO2 arterial: 54.9 — ABNORMAL HIGH
pH, Arterial: 7.418
pO2, Arterial: 337 — ABNORMAL HIGH
pO2, Arterial: 76 — ABNORMAL LOW
pO2, Arterial: 91

## 2011-07-02 LAB — CBC
HCT: 25.2 — ABNORMAL LOW
HCT: 26.1 — ABNORMAL LOW
HCT: 30 — ABNORMAL LOW
Hemoglobin: 10.1 — ABNORMAL LOW
Hemoglobin: 10.5 — ABNORMAL LOW
Hemoglobin: 9.3 — ABNORMAL LOW
Hemoglobin: 9.4 — ABNORMAL LOW
MCHC: 34.2
MCHC: 34.3
MCHC: 35.2
MCHC: 35.2
MCV: 90.3
MCV: 90.8
MCV: 90.9
MCV: 90.9
Platelets: 558 — ABNORMAL HIGH
Platelets: 612 — ABNORMAL HIGH
Platelets: 95 — ABNORMAL LOW
Platelets: 99 — ABNORMAL LOW
RBC: 2.89 — ABNORMAL LOW
RBC: 2.9 — ABNORMAL LOW
RDW: 13.8
RDW: 13.9
RDW: 14
RDW: 14.3
WBC: 3.9 — ABNORMAL LOW
WBC: 5.3

## 2011-07-02 LAB — POCT I-STAT 4, (NA,K, GLUC, HGB,HCT)
Glucose, Bld: 106 — ABNORMAL HIGH
Glucose, Bld: 115 — ABNORMAL HIGH
Glucose, Bld: 117 — ABNORMAL HIGH
Glucose, Bld: 78
HCT: 24 — ABNORMAL LOW
HCT: 29 — ABNORMAL LOW
HCT: 38 — ABNORMAL LOW
Hemoglobin: 12.6 — ABNORMAL LOW
Hemoglobin: 12.9 — ABNORMAL LOW
Hemoglobin: 8.2 — ABNORMAL LOW
Hemoglobin: 9.9 — ABNORMAL LOW
Potassium: 3.5
Potassium: 3.7
Potassium: 3.9
Sodium: 136
Sodium: 138

## 2011-07-02 LAB — BASIC METABOLIC PANEL
BUN: 10
BUN: 11
BUN: 14
CO2: 27
CO2: 28
Calcium: 7.4 — ABNORMAL LOW
Calcium: 8.4
Calcium: 9
Chloride: 100
Chloride: 103
Creatinine, Ser: 0.95
Creatinine, Ser: 0.99
Creatinine, Ser: 1.06
GFR calc Af Amer: >60
GFR calc non Af Amer: >60
GFR calc non Af Amer: >60
Glucose, Bld: 107 — ABNORMAL HIGH
Glucose, Bld: 118 — ABNORMAL HIGH
Potassium: 3.4 — ABNORMAL LOW
Potassium: 3.8
Sodium: 136

## 2011-07-02 LAB — COMPREHENSIVE METABOLIC PANEL
ALT: 91 — ABNORMAL HIGH
AST: 29
Albumin: 2.7 — ABNORMAL LOW
Albumin: 3.2 — ABNORMAL LOW
Alkaline Phosphatase: 109
Calcium: 8.7
Calcium: 9.3
Creatinine, Ser: 0.99
GFR calc Af Amer: >60
GFR calc Af Amer: >60
Glucose, Bld: 139 — ABNORMAL HIGH
Potassium: 3.4 — ABNORMAL LOW
Sodium: 136
Total Protein: 5.4 — ABNORMAL LOW
Total Protein: 6.3

## 2011-07-02 LAB — POCT I-STAT, CHEM 8
Creatinine, Ser: 1.2
Glucose, Bld: 109 — ABNORMAL HIGH
HCT: 27 — ABNORMAL LOW
Hemoglobin: 9.2 — ABNORMAL LOW
Potassium: 3.7
Sodium: 140
TCO2: 26

## 2011-07-02 LAB — GLUCOSE, CAPILLARY
Glucose-Capillary: 101 — ABNORMAL HIGH
Glucose-Capillary: 109 — ABNORMAL HIGH
Glucose-Capillary: 109 — ABNORMAL HIGH
Glucose-Capillary: 110 — ABNORMAL HIGH
Glucose-Capillary: 112 — ABNORMAL HIGH
Glucose-Capillary: 113 — ABNORMAL HIGH
Glucose-Capillary: 123 — ABNORMAL HIGH
Glucose-Capillary: 126 — ABNORMAL HIGH
Glucose-Capillary: 84

## 2011-07-02 LAB — POCT I-STAT 3, VENOUS BLOOD GAS (G3P V)
TCO2: 31
pCO2, Ven: 62 — ABNORMAL HIGH
pH, Ven: 7.278
pO2, Ven: 48 — ABNORMAL HIGH

## 2011-07-02 LAB — URINE CULTURE
Colony Count: NO GROWTH
Culture: NO GROWTH
Special Requests: NEGATIVE

## 2011-07-02 LAB — URINALYSIS, ROUTINE W REFLEX MICROSCOPIC
Bilirubin Urine: NEGATIVE
Glucose, UA: NEGATIVE
Nitrite: NEGATIVE
Specific Gravity, Urine: 1.017
pH: 7

## 2011-07-02 LAB — CREATININE, SERUM
Creatinine, Ser: 1.01
Creatinine, Ser: 1.03
GFR calc Af Amer: >60
GFR calc non Af Amer: >60

## 2011-07-02 LAB — HEMOGLOBIN AND HEMATOCRIT, BLOOD: HCT: 30.1 — ABNORMAL LOW

## 2011-07-02 LAB — PLATELET COUNT: Platelets: 211

## 2011-07-02 LAB — POCT I-STAT GLUCOSE
Glucose, Bld: 87
Operator id: 3342

## 2011-07-02 NOTE — Progress Notes (Signed)
  Subjective:    Patient ID: [name], male    DOB: 1951/06/18, 60 y.o.   MRN: [id number]  HPI 60 year old white male with history of coronary artery disease, squamous cell carcinoma of tongue, hypertension, depression, anxiety, cervical disc disease, hyperlipidemia, hypothyroidism, coronary artery disease history of tobacco abuse. Status post coronary artery bypass graft surgery September 2009. 5 grafts were done. Patient had carcinoma of base of tongue treated with radiation in 2002. Had a C-spine discectomy in 2000. Says he had viral hepatitis many years ago. Intolerant of Lipitor Crestor and TriCor. Says all of these cause muscle spasm with his swallowing. Has had to control lipids with diet and Zetia. History of colonoscopy by Dr. Loreta Ave. History of depression and allergic rhinitis. History of adult attention deficit disorder, BPH, seborrheic dermatitis, erectile dysfunction. Had left thalamic stroke June 2005. Dr. Sandria Manly indicates patient's carotid disease is due to radiation treatment. Right external carotid is occluded. In 2010 had 50-79% stenosis right internal carotid and 40-59% stenosis left internal carotid. Diagnosed with Horner's syndrome.    Review of Systems  Constitutional: Positive for fatigue.  HENT:       Trouble swallowing  Eyes: Negative.   Respiratory: Negative.   Cardiovascular: Negative for leg swelling.  Gastrointestinal: Negative.   Genitourinary:       Erectile dysfunction  Musculoskeletal: Positive for back pain.  Hematological: Negative.   Psychiatric/Behavioral: Positive for dysphoric mood. Negative for suicidal ideas.       Anxiety       Objective:   Physical Exam  Vitals reviewed. Constitutional: He is oriented to person, place, and time. He appears well-nourished. No distress.  HENT:  Head: Normocephalic and atraumatic.  Right Ear: External ear normal.  Left Ear: External ear normal.  Mouth/Throat: No oropharyngeal exudate.  Eyes: Conjunctivae and  EOM are normal. Pupils are equal, round, and reactive to light. Right eye exhibits no discharge. Left eye exhibits no discharge. No scleral icterus.  Neck: Neck supple. No thyromegaly present.  Cardiovascular: Normal rate, regular rhythm and normal heart sounds.   Pulmonary/Chest: Effort normal and breath sounds normal. He has no rales.  Abdominal: Soft. Bowel sounds are normal. He exhibits no mass. There is no tenderness.  Genitourinary: Prostate normal.  Musculoskeletal: He exhibits no edema.  Neurological: He is alert and oriented to person, place, and time. He has normal reflexes. Coordination normal.  Skin: Skin is warm and dry.  Psychiatric: He has a normal mood and affect.          Assessment & Plan:  Coronary artery disease  Squamous cell carcinoma of the tongue  Hypertension  Hyperlipidemia  History of thalamic stroke  Erectile dysfunction  Anxiety depression  Carotid artery disease  History of back pain  History of adult attention deficit disorder  GE reflux  History of Horner's syndrome  History of cervical disc disease  Patient is to return in 6 months for lipid panel, blood pressure check and office visit

## 2011-07-03 ENCOUNTER — Ambulatory Visit: Payer: 59 | Attending: Internal Medicine | Admitting: Physical Therapy

## 2011-07-03 DIAGNOSIS — IMO0001 Reserved for inherently not codable concepts without codable children: Secondary | ICD-10-CM | POA: Insufficient documentation

## 2011-07-03 DIAGNOSIS — M542 Cervicalgia: Secondary | ICD-10-CM | POA: Insufficient documentation

## 2011-07-03 DIAGNOSIS — M545 Low back pain, unspecified: Secondary | ICD-10-CM | POA: Insufficient documentation

## 2011-07-04 LAB — URINALYSIS, ROUTINE W REFLEX MICROSCOPIC
Bilirubin Urine: NEGATIVE
Glucose, UA: NEGATIVE
Hgb urine dipstick: NEGATIVE
Ketones, ur: NEGATIVE
Nitrite: NEGATIVE
Protein, ur: NEGATIVE
Specific Gravity, Urine: 1.022
Urobilinogen, UA: 1
pH: 6.5

## 2011-07-04 LAB — BLOOD GAS, ARTERIAL
Acid-Base Excess: 2.2 — ABNORMAL HIGH
Bicarbonate: 26 — ABNORMAL HIGH
Drawn by: 206361
TCO2: 27.2
pCO2 arterial: 38.8
pO2, Arterial: 76.9 — ABNORMAL LOW

## 2011-07-04 LAB — COMPREHENSIVE METABOLIC PANEL
ALT: 42
Alkaline Phosphatase: 69
CO2: 24
Calcium: 8.9
GFR calc non Af Amer: >60
Glucose, Bld: 103 — ABNORMAL HIGH
Sodium: 137

## 2011-07-04 LAB — TYPE AND SCREEN: ABO/RH(D): O POS

## 2011-07-04 LAB — PROTIME-INR: INR: 0.9

## 2011-07-04 LAB — CBC
Hemoglobin: 15.9
MCHC: 35.9
RBC: 4.97

## 2011-07-04 LAB — HEMOGLOBIN A1C: Hgb A1c MFr Bld: 5.2

## 2011-07-05 ENCOUNTER — Encounter: Payer: Commercial Managed Care - PPO | Admitting: Physical Therapy

## 2011-07-10 ENCOUNTER — Encounter: Payer: Commercial Managed Care - PPO | Admitting: Physical Therapy

## 2011-07-12 ENCOUNTER — Encounter: Payer: Commercial Managed Care - PPO | Admitting: Physical Therapy

## 2011-07-20 ENCOUNTER — Other Ambulatory Visit: Payer: Commercial Managed Care - PPO

## 2011-08-06 ENCOUNTER — Other Ambulatory Visit: Payer: Self-pay

## 2011-08-06 MED ORDER — LEVOTHYROXINE SODIUM 88 MCG PO TABS: 88.0000 ug | ORAL_TABLET | Freq: Every day | ORAL | Status: DC

## 2011-12-11 DIAGNOSIS — J449 Chronic obstructive pulmonary disease, unspecified: Secondary | ICD-10-CM

## 2011-12-11 HISTORY — DX: Chronic obstructive pulmonary disease, unspecified: J44.9

## 2012-01-29 ENCOUNTER — Other Ambulatory Visit: Payer: Self-pay

## 2012-01-29 ENCOUNTER — Other Ambulatory Visit: Payer: Self-pay | Admitting: Internal Medicine

## 2012-01-29 MED ORDER — EZETIMIBE 10 MG PO TABS: 10.0000 mg | ORAL_TABLET | Freq: Every day | ORAL | Status: DC

## 2012-02-26 ENCOUNTER — Other Ambulatory Visit: Payer: Self-pay | Admitting: Internal Medicine

## 2012-03-20 ENCOUNTER — Other Ambulatory Visit: Payer: Self-pay | Admitting: Internal Medicine

## 2012-03-25 ENCOUNTER — Other Ambulatory Visit: Payer: Self-pay | Admitting: Internal Medicine

## 2012-03-25 ENCOUNTER — Other Ambulatory Visit: Payer: 59 | Admitting: Internal Medicine

## 2012-03-25 DIAGNOSIS — E039 Hypothyroidism, unspecified: Secondary | ICD-10-CM

## 2012-03-25 LAB — TSH: TSH: 0.832 u[IU]/mL (ref 0.350–4.500)

## 2012-03-27 ENCOUNTER — Ambulatory Visit (INDEPENDENT_AMBULATORY_CARE_PROVIDER_SITE_OTHER): Payer: 59 | Admitting: Internal Medicine

## 2012-03-27 VITALS — BP 150/84 | Temp 98.0°F | Ht 67.5 in | Wt 164.0 lb

## 2012-03-27 DIAGNOSIS — E039 Hypothyroidism, unspecified: Secondary | ICD-10-CM

## 2012-03-29 ENCOUNTER — Encounter: Payer: Self-pay | Admitting: Internal Medicine

## 2012-03-29 DIAGNOSIS — E039 Hypothyroidism, unspecified: Secondary | ICD-10-CM | POA: Insufficient documentation

## 2012-03-29 NOTE — Progress Notes (Signed)
  Subjective:    Patient ID: [name], male    DOB: [birth year], 61 y.o.   MRN: [id number]  HPI 61 year old white male with history of anxiety depression, hypertension, hyperlipidemia, coronary artery disease, GE reflux, carcinoma of the base of the tongue status post radiation treatment 2002, cervical disc disease, statin intolerance, allergic rhinitis, hypothyroidism, adult attention deficit disorder, BPH, erectile dysfunction, seborrheic dermatitis. Left thalamic stroke June 2005. History of migraine headaches. History of left Horner's syndrome. History of right vertebral artery occlusion, 50% stenosis of the right proximal internal carotid artery, severe narrowing of the right external carotid artery. Had upper endoscopy by Dr. Loreta Ave 2009 showing benign squamous mucosa with mild chronic inflammation.  Smoked for some 30 years but has been quit for at least 15 years. 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 Currently he is not working. He does drink beer. Not smoking at present. He has remarried and has one son from previous marriage.  Family history father died apparently with heart failure but had history of stroke around age 55 and diabetes. Mother with history of ovarian cancer. Maternal grandmother, paternal grandmother with history of cancer. He has 2 brothers.  Patient is not had thyroid check since March 2012. At that time TSH was 0.988. He's been feeling a bit tired lately and wanted to have it checked.      Review of Systems     Objective:   Physical Exam neck is supple without thyromegaly or adenopathy; chest clear to auscultation; cardiac exam regular rate and rhythm normal S1 and S2; extremities without edema; he is alert and oriented. Affect is appropriate. History history of depression                Assessment & Plan:  Hypothyroidism  Arteriosclerotic cardiovascular  disease  History of carcinoma of the tongue  History of anxiety depression  Hypertension  GE reflux  Cervical disc disease  Plan: TSH checked. Return in 6 months for physical examination.  History of hyperlipidemia

## 2012-03-29 NOTE — Patient Instructions (Addendum)
Continue same medications and return in 6 months for physical exam. Thyroid functions are within normal limits

## 2012-04-07 ENCOUNTER — Ambulatory Visit (INDEPENDENT_AMBULATORY_CARE_PROVIDER_SITE_OTHER): Payer: 59 | Admitting: Internal Medicine

## 2012-04-07 ENCOUNTER — Telehealth: Payer: Self-pay

## 2012-04-07 ENCOUNTER — Encounter: Payer: Self-pay | Admitting: Internal Medicine

## 2012-04-07 VITALS — BP 138/74 | HR 80 | Temp 98.0°F | Ht 67.0 in | Wt 165.0 lb

## 2012-04-07 DIAGNOSIS — L259 Unspecified contact dermatitis, unspecified cause: Secondary | ICD-10-CM

## 2012-04-07 NOTE — Telephone Encounter (Signed)
Sp w/pt -- he can be here by 4;40. He's near [place].  On his way.

## 2012-04-07 NOTE — Telephone Encounter (Signed)
Has poison ivy on legs and arms for about 2 weeks now. OV or RX for something?

## 2012-04-07 NOTE — Telephone Encounter (Signed)
Office visit today

## 2012-04-26 ENCOUNTER — Encounter: Payer: Self-pay | Admitting: Internal Medicine

## 2012-04-26 NOTE — Patient Instructions (Addendum)
Take Sterapred DS 10 mg 6 day dosepak as directed. 

## 2012-04-26 NOTE — Progress Notes (Signed)
  Subjective:    Patient ID: [name], male    DOB: Feb 17, 1951, 61 y.o.   MRN: [id number]  HPI 61 year old white male with multiple medical problems has rash on trunk and arms consistent with contact dermatitis. Rashes been present for several days. He does do yard work.    Review of Systems     Objective:   Physical Exam linear raised rash on trunk and arms consistent with poison ivy. Rash is not severe.        Assessment & Plan:   contact dermatitis-likely poison ivy  Plan: Sterapred DS 10 mg 6 day dosepak.

## 2012-04-29 ENCOUNTER — Encounter: Payer: Self-pay | Admitting: Internal Medicine

## 2012-04-29 ENCOUNTER — Ambulatory Visit (INDEPENDENT_AMBULATORY_CARE_PROVIDER_SITE_OTHER): Payer: 59 | Admitting: Internal Medicine

## 2012-04-29 VITALS — BP 124/78 | HR 72 | Temp 98.4°F | Ht 67.0 in | Wt 162.0 lb

## 2012-04-29 DIAGNOSIS — L259 Unspecified contact dermatitis, unspecified cause: Secondary | ICD-10-CM

## 2012-04-30 NOTE — Patient Instructions (Addendum)
Use calamine lotion on rash. Take Sterapred DS 10 mg 12 day dosepak as directed.

## 2012-04-30 NOTE — Progress Notes (Signed)
  Subjective:    Patient ID: [name], male    DOB: 1950-12-21, 61 y.o.   MRN: [id number]  HPI 61 year old white male treated July 8 for contact dermatitis presumably poison ivy with a Sterapred DS dosepak. He purchased a new weedeater recently and was out in the yard once again. After doing yard work he came into the house and took a shower but now has another bout of apparent contact dermatitis affecting his arms and his left leg. Rash is itchy. He's had it for a few days.    Review of Systems     Objective:   Physical Exam skin: Pale warm and dry. Has excoriations left upper leg and papules with a somewhat linear distribution. Erythema right upper arm without significant        Assessment & Plan:  Contact dermatitis-likely poison ivy Plan: Sterapred DS 10 mg 12 day dosepak take hysterectomy. Reminded him once he finishes his you are work he is to come into his house, change clothes, and put those clothes  In the wash and take a complete shower.

## 2012-06-17 DIAGNOSIS — G458 Other transient cerebral ischemic attacks and related syndromes: Secondary | ICD-10-CM

## 2012-06-17 HISTORY — DX: Other transient cerebral ischemic attacks and related syndromes: G45.8

## 2012-08-18 ENCOUNTER — Other Ambulatory Visit: Payer: 59 | Admitting: Internal Medicine

## 2012-08-18 DIAGNOSIS — E039 Hypothyroidism, unspecified: Secondary | ICD-10-CM

## 2012-08-18 DIAGNOSIS — Z125 Encounter for screening for malignant neoplasm of prostate: Secondary | ICD-10-CM

## 2012-08-18 DIAGNOSIS — E785 Hyperlipidemia, unspecified: Secondary | ICD-10-CM

## 2012-08-18 DIAGNOSIS — I10 Essential (primary) hypertension: Secondary | ICD-10-CM

## 2012-08-18 LAB — LIPID PANEL
Cholesterol: 201 mg/dL — ABNORMAL HIGH (ref 0–200)
HDL: 67 mg/dL (ref >39)
Triglycerides: 113 mg/dL (ref <150)
VLDL: 23 mg/dL (ref 0–40)

## 2012-08-18 LAB — CBC WITH DIFFERENTIAL/PLATELET
Eosinophils Absolute: 0.2 10*3/uL (ref 0.0–0.7)
Eosinophils Relative: 4 % (ref 0–5)
HCT: 41.8 % (ref 39.0–52.0)
Hemoglobin: 14.6 g/dL (ref 13.0–17.0)
Lymphs Abs: 0.8 10*3/uL (ref 0.7–4.0)
MCH: 30.9 pg (ref 26.0–34.0)
MCHC: 34.9 g/dL (ref 30.0–36.0)
MCV: 88.6 fL (ref 78.0–100.0)
Monocytes Absolute: 0.5 10*3/uL (ref 0.1–1.0)
Monocytes Relative: 12 % (ref 3–12)
Neutrophils Relative %: 64 % (ref 43–77)
RBC: 4.72 MIL/uL (ref 4.22–5.81)

## 2012-08-18 LAB — COMPREHENSIVE METABOLIC PANEL
BUN: 16 mg/dL (ref 6–23)
CO2: 29 mEq/L (ref 19–32)
Glucose, Bld: 80 mg/dL (ref 70–99)
Sodium: 145 mEq/L (ref 135–145)
Total Bilirubin: 0.4 mg/dL (ref 0.3–1.2)
Total Protein: 5.5 g/dL — ABNORMAL LOW (ref 6.0–8.3)

## 2012-08-19 ENCOUNTER — Ambulatory Visit (INDEPENDENT_AMBULATORY_CARE_PROVIDER_SITE_OTHER): Payer: 59 | Admitting: Internal Medicine

## 2012-08-19 VITALS — BP 144/82 | HR 72 | Temp 97.0°F | Ht 67.5 in | Wt 161.0 lb

## 2012-08-19 DIAGNOSIS — F341 Dysthymic disorder: Secondary | ICD-10-CM

## 2012-08-19 DIAGNOSIS — E785 Hyperlipidemia, unspecified: Secondary | ICD-10-CM

## 2012-08-19 DIAGNOSIS — I779 Disorder of arteries and arterioles, unspecified: Secondary | ICD-10-CM

## 2012-08-19 DIAGNOSIS — F329 Major depressive disorder, single episode, unspecified: Secondary | ICD-10-CM

## 2012-08-19 DIAGNOSIS — F419 Anxiety disorder, unspecified: Secondary | ICD-10-CM

## 2012-08-19 DIAGNOSIS — I10 Essential (primary) hypertension: Secondary | ICD-10-CM

## 2012-08-19 DIAGNOSIS — E039 Hypothyroidism, unspecified: Secondary | ICD-10-CM

## 2012-08-19 LAB — POCT URINALYSIS DIPSTICK
Bilirubin, UA: NEGATIVE
Ketones, UA: NEGATIVE
Leukocytes, UA: NEGATIVE

## 2012-09-15 ENCOUNTER — Other Ambulatory Visit: Payer: Self-pay | Admitting: Internal Medicine

## 2012-09-18 ENCOUNTER — Telehealth: Payer: Self-pay | Admitting: Internal Medicine

## 2012-09-18 MED ORDER — HYDROCODONE-ACETAMINOPHEN 5-325 MG PO TABS
1.0000 | ORAL_TABLET | Freq: Four times a day (QID) | ORAL | Status: DC | PRN
Start: 2012-09-18 — End: 2013-02-13

## 2012-09-18 NOTE — Telephone Encounter (Signed)
Pt stated he needs a new prescription for Vicodin 500 mg.

## 2012-09-18 NOTE — Telephone Encounter (Signed)
Please refill but change to Norco 5/325

## 2012-11-15 ENCOUNTER — Other Ambulatory Visit: Payer: Self-pay

## 2012-11-20 ENCOUNTER — Encounter (HOSPITAL_COMMUNITY): Payer: Self-pay | Admitting: *Deleted

## 2012-11-20 ENCOUNTER — Emergency Department (HOSPITAL_COMMUNITY)
Admission: EM | Admit: 2012-11-20 | Discharge: 2012-11-20 | Disposition: A | Discharge status: Home / Self Care | Payer: 59 | Source: Home / Self Care | Attending: Emergency Medicine | Admitting: Emergency Medicine

## 2012-11-20 DIAGNOSIS — Z23 Encounter for immunization: Secondary | ICD-10-CM

## 2012-11-20 DIAGNOSIS — W64XXXA Exposure to other animate mechanical forces, initial encounter: Secondary | ICD-10-CM

## 2012-11-20 DIAGNOSIS — S61459A Open bite of unspecified hand, initial encounter: Secondary | ICD-10-CM

## 2012-11-20 DIAGNOSIS — S61409A Unspecified open wound of unspecified hand, initial encounter: Secondary | ICD-10-CM

## 2012-11-20 DIAGNOSIS — W5503XA Scratched by cat, initial encounter: Secondary | ICD-10-CM

## 2012-11-20 DIAGNOSIS — T07XXXA Unspecified multiple injuries, initial encounter: Secondary | ICD-10-CM

## 2012-11-20 MED ORDER — DOXYCYCLINE HYCLATE 100 MG PO CAPS: 100.0000 mg | ORAL_CAPSULE | Freq: Two times a day (BID) | ORAL | Status: DC

## 2012-11-20 MED ORDER — TETANUS-DIPHTH-ACELL PERTUSSIS 5-2.5-18.5 LF-MCG/0.5 IM SUSP
0.5000 mL | Freq: Once | INTRAMUSCULAR | Status: AC
  Administered 2012-11-20: 0.5 mL via INTRAMUSCULAR

## 2012-11-20 MED ORDER — HYDROCODONE-ACETAMINOPHEN 5-325 MG PO TABS: 1.0000 | ORAL_TABLET | Freq: Four times a day (QID) | ORAL | Status: DC | PRN

## 2012-11-20 MED ORDER — TETANUS-DIPHTH-ACELL PERTUSSIS 5-2.5-18.5 LF-MCG/0.5 IM SUSP
INTRAMUSCULAR | Status: AC
  Filled 2012-11-20: qty 0.5

## 2012-11-20 NOTE — ED Notes (Signed)
Clyda Hurdle, emt at bedside performing wound care

## 2012-11-20 NOTE — ED Notes (Signed)
Per MD instructions irrigated wound post betadine instructions

## 2012-11-20 NOTE — ED Notes (Signed)
Pt reports being bit by cat that has been living inside for 4-5 years - unknown and doubts that cat has had shots - 3 bites on left hand bleeding controlled

## 2012-11-20 NOTE — ED Provider Notes (Signed)
History     CSN: [id number]  Arrival date & time 11/20/12  1231   First MD Initiated Contact with Patient 11/20/12 1329      Chief Complaint  Patient presents with  . Animal Bite    (Consider location/radiation/quality/duration/timing/severity/associated sxs/prior treatment) HPI Comments: Patient presents urgent care describing that he was bitten and scratched by his mother's cat today while attempting to capture her. His mother passed away several months ago he's been cleaning and removing or belongings from his mom's house. While attempting to capture the unwilling cat, she proceeded to fight him and scratched him. He sustained a bite to the palmar surface of his right hand and multiple scratches to second third and fourth finger of left hand and also 2 fingers from his right hand. Somewhat sore and tender at touch and with movement.  When asked about the rabies immunization status of this cat? He described that he doesn't know if this cat was living with his mother for the last 4-5 years, but was adopted from the street. " I can certainly find out, if he has been vaccinated for rabies".   Patient thinks that he has had a tetanus shot in the last 10 years but is not 100% certain. He does however describe that he has not had a shot in the last 5 years.   Patient reports- after making a phone call that rabies immunization records indicate that cat is due for a rabies updated in March.  Patient is a 61 y.o. male presenting with animal bite. The history is provided by the patient.  Animal Bite  The incident occurred today. The incident occurred at another residence. There is an injury to the right hand and left hand. The pain is mild. It is unlikely that a foreign body is present. Pertinent negatives include no numbness, no headaches and no focal weakness. There have been no prior injuries to these areas. He is right-handed. His tetanus status is UTD.    Past Medical History  Diagnosis  Date  . CAD (coronary artery disease)   . Squamous cell cancer of tongue   . HTN (hypertension), benign   . Depression, major     Past Surgical History  Procedure Laterality Date  . Spine surgery  2000    c-spine disc/Kritzer  . Coronary artery bypass graft  2009    x5  . Unknown      Family History  Problem Relation Age of Onset  . Cancer Mother     ovarian  . Stroke Father   . Diabetes Father   . Arthritis Father   . Hypertension Brother     History  Substance Use Topics  . Smoking status: Former Smoker -- 3.00 packs/day for 30 years    Types: Cigarettes    Quit date: 01/29/2001  . Smokeless tobacco: Not on file  . Alcohol Use: 4.2 oz/week    7 Cans of beer per week      Review of Systems  Skin: Positive for wound. Negative for color change.  Neurological: Negative for focal weakness, numbness and headaches.  Psychiatric/Behavioral: The patient is nervous/anxious.     Allergies  Crestor and Lipitor  Home Medications   Current Outpatient Rx  Name  Route  Sig  Dispense  Refill  . amLODipine (NORVASC) 10 MG tablet      TAKE 1 TABLET BY MOUTH ONCE DAILY   30 tablet   5   . amphetamine-dextroamphetamine (ADDERALL) 30 MG tablet  Oral   Take 30 mg by mouth 3 (three) times daily.           Marland Kitchen aspirin 325 MG tablet   Oral   Take 325 mg by mouth daily.           Marland Kitchen buPROPion (WELLBUTRIN XL) 150 MG 24 hr tablet   Oral   Take 150 mg by mouth daily.         . Cholecalciferol (VITAMIN D) 2000 UNITS tablet   Oral   Take 2,000 Units by mouth daily.           . cyclobenzaprine (FLEXERIL) 10 MG tablet      1/2 to 1 tablet tid prn   30 tablet   1   . doxycycline (VIBRAMYCIN) 100 MG capsule   Oral   Take 1 capsule (100 mg total) by mouth 2 (two) times daily.   20 capsule   0   . ezetimibe (ZETIA) 10 MG tablet   Oral   Take 1 tablet (10 mg total) by mouth daily.   30 tablet   6   . fish oil-omega-3 fatty acids 1000 MG capsule   Oral    Take 2 g by mouth daily.           Marland Kitchen FLUoxetine (PROZAC) 40 MG capsule   Oral   Take 40 mg by mouth daily. 1 1/2 tabs daily         . HYDROcodone-acetaminophen (NORCO) 5-325 MG per tablet   Oral   Take 1 tablet by mouth every 6 (six) hours as needed for pain.   60 tablet   0   . HYDROcodone-acetaminophen (NORCO/VICODIN) 5-325 MG per tablet   Oral   Take 1 tablet by mouth every 6 (six) hours as needed for pain.   10 tablet   0   . hydrOXYzine (ATARAX/VISTARIL) 25 MG tablet   Oral   Take 25 mg by mouth 3 (three) times daily as needed.         Marland Kitchen levothyroxine (SYNTHROID, LEVOTHROID) 88 MCG tablet      TAKE 1 TABLET BY MOUTH ONCE DAILY   30 tablet   2   . lisinopril (PRINIVIL,ZESTRIL) 10 MG tablet      TAKE 1 TABLET BY MOUTH ONCE DAILY   30 tablet   90   . metoprolol succinate (TOPROL-XL) 25 MG 24 hr tablet   Oral   Take 25 mg by mouth daily.           . niacin 500 MG tablet   Oral   Take 500 mg by mouth daily with breakfast.           . omeprazole (PRILOSEC) 20 MG capsule   Oral   Take 20 mg by mouth daily.             BP 143/86  Pulse 81  Temp(Src) 98 F (36.7 C) (Oral)  Resp 20  SpO2 98%  Physical Exam  Nursing note and vitals reviewed. Constitutional: He is oriented to person, place, and time. He appears well-developed. No distress.  Musculoskeletal: He exhibits tenderness.  Neurological: He is alert and oriented to person, place, and time.  Skin: Skin is warm.    ED Course  Procedures (including critical care time)  Labs Reviewed - No data to display No results found.   1. Cat bite of hand   2. Cat scratch       MDM  Provoked cat- bite and multiple  scratches. The patient will be started on doxycycline for prophylaxis, today received Tdap booster. Discussed wound care, and secondary intention healing.       Jimmie Molly, MD 11/20/12 [phone]

## 2012-11-28 ENCOUNTER — Encounter: Payer: Self-pay | Admitting: Pharmacist Clinician (PhC)/ Clinical Pharmacy Specialist

## 2012-12-24 ENCOUNTER — Encounter: Payer: Self-pay | Admitting: Cardiovascular Disease

## 2012-12-29 ENCOUNTER — Encounter: Payer: Self-pay | Admitting: Internal Medicine

## 2012-12-29 NOTE — Progress Notes (Signed)
  Subjective:    Patient ID: [name], male    DOB: [birth year], 62 y.o.   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  Patient had left thalamic stroke in 2005. History of migraine headaches. History of left Horner's syndrome. History of right vertebral artery occlusion, 50% stenosis of right proximal internal carotid artery, severe narrowing of the right external carotid artery.  Patient had upper endoscopy by Dr. Loreta Ave in 2009 showing benign squamous because of with mild chronic inflammation.  Smoked for some 30 years but quit for at least 62 years male. 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 He currently is not working. He drinks beer. Not smoking at present. Has remarried and has one son from a previous marriage.  Family history: Father died apparently with heart failure but had history of stroke around age 72 of diabetes. Mother died with ovarian cancer. Maternal grandmother, paternal grandmother with history of cancer. He has 2 brothers.    Review of Systems  Constitutional: Positive for fatigue.  Cardiovascular:       History of carotid disease  Gastrointestinal: Negative.   Genitourinary:       BPH  Allergic/Immunologic: Negative.   Psychiatric/Behavioral:       Depression       Objective:   Physical Exam  Vitals reviewed. Constitutional: He is oriented to person, place, and time. He appears well-developed and well-nourished. He appears distressed.  HENT:  Head: Normocephalic and atraumatic.  Right Ear: External ear normal.  Left Ear: External ear  normal.  Mouth/Throat: Oropharynx is clear and moist. No oropharyngeal exudate.  Eyes: Conjunctivae and EOM are normal. Pupils are equal, round, and reactive to light. Right eye exhibits no discharge. Left eye exhibits no discharge. No scleral icterus.  Neck: Neck supple. No JVD present. No thyromegaly present.  Cardiovascular: Normal rate, regular rhythm, normal heart sounds and intact distal pulses.   No murmur heard. Pulmonary/Chest: Effort normal and breath sounds normal. He has no rales.  Abdominal: Soft. Bowel sounds are normal. There is no tenderness. There is no rebound and no guarding.  Genitourinary:  Prostate enlarged without nodules  Musculoskeletal: He exhibits no edema.  Lymphadenopathy:    He has no cervical adenopathy.  Neurological: He is alert and oriented to person, place, and time. He has normal reflexes. No cranial nerve deficit. Coordination normal.  Skin: Skin is warm and dry. No rash noted. He is not diaphoretic.  Psychiatric: He has a normal mood and affect. His behavior is normal. Judgment and thought content normal.          Assessment & Plan:  Hypothyroidism  Arteriosclerotic cardiovascular disease  History of carcinoma of the tongue  History of anxiety depression  Hypertension  GE reflux  Cervicals disc disease  History of hyperlipidemia  Plan: Return in 6 months for office visit, lipid panel liver functions and TSH

## 2012-12-29 NOTE — Patient Instructions (Addendum)
Continue same medications and return in 6 months for office visit lipid panel liver functions and TSH

## 2013-02-12 ENCOUNTER — Other Ambulatory Visit: Payer: 59 | Admitting: Internal Medicine

## 2013-02-12 DIAGNOSIS — E785 Hyperlipidemia, unspecified: Secondary | ICD-10-CM

## 2013-02-12 DIAGNOSIS — Z79899 Other long term (current) drug therapy: Secondary | ICD-10-CM

## 2013-02-12 DIAGNOSIS — E039 Hypothyroidism, unspecified: Secondary | ICD-10-CM

## 2013-02-12 LAB — HEPATIC FUNCTION PANEL
ALT: 25 U/L (ref 0–53)
AST: 23 U/L (ref 0–37)
Albumin: 4.1 g/dL (ref 3.5–5.2)
Alkaline Phosphatase: 65 U/L (ref 39–117)

## 2013-02-12 LAB — LIPID PANEL
Cholesterol: 199 mg/dL (ref 0–200)
HDL: 62 mg/dL (ref >39)
Total CHOL/HDL Ratio: 3.2 Ratio
Triglycerides: 126 mg/dL (ref <150)

## 2013-02-12 LAB — TSH: TSH: 1.396 u[IU]/mL (ref 0.350–4.500)

## 2013-02-13 ENCOUNTER — Encounter: Payer: Self-pay | Admitting: Internal Medicine

## 2013-02-13 ENCOUNTER — Ambulatory Visit (INDEPENDENT_AMBULATORY_CARE_PROVIDER_SITE_OTHER): Payer: 59 | Admitting: Internal Medicine

## 2013-02-13 VITALS — BP 142/74 | HR 68 | Temp 98.0°F | Wt 165.0 lb

## 2013-02-13 DIAGNOSIS — F341 Dysthymic disorder: Secondary | ICD-10-CM

## 2013-02-13 DIAGNOSIS — M25512 Pain in left shoulder: Secondary | ICD-10-CM

## 2013-02-13 DIAGNOSIS — Z789 Other specified health status: Secondary | ICD-10-CM

## 2013-02-13 DIAGNOSIS — I10 Essential (primary) hypertension: Secondary | ICD-10-CM

## 2013-02-13 DIAGNOSIS — Z87891 Personal history of nicotine dependence: Secondary | ICD-10-CM

## 2013-02-13 DIAGNOSIS — Z888 Allergy status to other drugs, medicaments and biological substances status: Secondary | ICD-10-CM

## 2013-02-13 DIAGNOSIS — M25519 Pain in unspecified shoulder: Secondary | ICD-10-CM

## 2013-02-13 DIAGNOSIS — F329 Major depressive disorder, single episode, unspecified: Secondary | ICD-10-CM

## 2013-02-13 DIAGNOSIS — E039 Hypothyroidism, unspecified: Secondary | ICD-10-CM

## 2013-02-13 DIAGNOSIS — Z8673 Personal history of transient ischemic attack (TIA), and cerebral infarction without residual deficits: Secondary | ICD-10-CM

## 2013-02-13 DIAGNOSIS — E785 Hyperlipidemia, unspecified: Secondary | ICD-10-CM

## 2013-02-13 MED ORDER — HYDROCODONE-ACETAMINOPHEN 5-325 MG PO TABS: 1.0000 | ORAL_TABLET | Freq: Four times a day (QID) | ORAL | Status: DC | PRN

## 2013-02-13 NOTE — Patient Instructions (Addendum)
Take Norco 5/325 sparingly for left shoulder pain. Consider orthopedic evaluation of shoulder. Continue same medications including Zetia and thyroid replacement. Return in 6 months for physical exam

## 2013-02-13 NOTE — Progress Notes (Signed)
  Subjective:    Patient ID: [name], male    DOB: October 09, 1950, 62 y.o.   MRN: [id number]  HPI  62 year old white male with history of many medical problems including coronary artery disease, internal carotid artery stenosis, hypertension, hyperlipidemia, anxiety, attention deficit disorder, back pain, cervical disc disease, depression, erectile dysfunction, GE reflux, hypothyroidism, ptosis of left eye, history of squamous cell carcinoma of the tongue. New problem today is left shoulder pain. He plans to speak to a friend of his who is a Surveyor, mining who formerly worked for an Scientist, research (life sciences). He is off to [employer] today for a few days.  Says he needs some pain medication for his shoulder pain. It was aggravated when he cleaned out his mother's house. Took a total of 4 months cleaning her house after she passed away. He says there were lots of items to donate.    Review of Systems     Objective:   Physical Exam  Constitutional: He appears well-developed and well-nourished. No distress.  HENT:  Head: Normocephalic and atraumatic.  Mouth/Throat: Oropharynx is clear and moist.  Eyes: Conjunctivae are normal. Right eye exhibits no discharge. Left eye exhibits no discharge.  Neck: Neck supple. No JVD present. No thyromegaly present.  Cardiovascular: Normal rate, regular rhythm, normal heart sounds and intact distal pulses.   No murmur heard. Pulmonary/Chest: Effort normal and breath sounds normal. No respiratory distress. He has no wheezes. He has no rales. He exhibits no tenderness.  Abdominal: Soft. Bowel sounds are normal.  Musculoskeletal: He exhibits no edema.  Lymphadenopathy:    He has no cervical adenopathy.  Skin: Skin is warm and dry. He is not diaphoretic.  Psychiatric: He has a normal mood and affect. His behavior is normal. Judgment and thought content normal.          Assessment & Plan:  Hyperlipidemia-lipid panel liver functions are  normal  Hypertension-blood pressure stable  Coronary artery disease-stable with no recent chest pain  Internal carotid  artery stenosis  History of carcinoma base of tongue status post radiation treatment 2002  Cervical disc disease  Hypothyroidism-TSH within normal limits on thyroid replacement therapy  History of left thalamic stroke 2005  History of left Horner syndrome  History of right vertebral artery occlusion  50% stenosis of right proximal internal carotid artery and severe narrowing of the right external carotid artery  History of smoking-currently nonsmoker  Statin intolerance  GE reflux-treated with Prilosec  Left shoulder arthropathy. Patient does not want to see specialist at this point in time. Prescribed Norco 5/325 #60 with 1 refill  Anxiety depression  Return in 6 months for physical exam and fasting lab work

## 2013-02-26 ENCOUNTER — Telehealth: Payer: Self-pay | Admitting: Internal Medicine

## 2013-02-26 ENCOUNTER — Other Ambulatory Visit: Payer: Self-pay | Admitting: Internal Medicine

## 2013-02-26 ENCOUNTER — Other Ambulatory Visit: Payer: Self-pay

## 2013-02-26 MED ORDER — FLUCONAZOLE 150 MG PO TABS: 150.0000 mg | ORAL_TABLET | Freq: Once | ORAL | Status: DC

## 2013-02-26 NOTE — Telephone Encounter (Signed)
Call in Diflucan 150 mg tablet with no refill

## 2013-03-12 ENCOUNTER — Other Ambulatory Visit: Payer: Self-pay | Admitting: Internal Medicine

## 2013-04-16 ENCOUNTER — Other Ambulatory Visit: Payer: Self-pay

## 2013-04-16 MED ORDER — METOPROLOL SUCCINATE ER 25 MG PO TB24: 25.0000 mg | ORAL_TABLET | Freq: Every day | ORAL | Status: DC

## 2013-04-16 NOTE — Telephone Encounter (Signed)
Rx was sent to pharmacy electronically. 

## 2013-04-28 ENCOUNTER — Other Ambulatory Visit: Payer: Self-pay | Admitting: Internal Medicine

## 2013-05-20 ENCOUNTER — Encounter: Payer: Self-pay | Admitting: Cardiovascular Disease

## 2013-05-20 ENCOUNTER — Ambulatory Visit (INDEPENDENT_AMBULATORY_CARE_PROVIDER_SITE_OTHER): Payer: 59 | Admitting: Cardiovascular Disease

## 2013-05-20 VITALS — BP 110/70 | HR 70 | Ht 67.0 in | Wt 164.0 lb

## 2013-05-20 DIAGNOSIS — I2510 Atherosclerotic heart disease of native coronary artery without angina pectoris: Secondary | ICD-10-CM

## 2013-05-20 DIAGNOSIS — I779 Disorder of arteries and arterioles, unspecified: Secondary | ICD-10-CM

## 2013-05-20 DIAGNOSIS — I10 Essential (primary) hypertension: Secondary | ICD-10-CM

## 2013-05-20 DIAGNOSIS — E785 Hyperlipidemia, unspecified: Secondary | ICD-10-CM

## 2013-05-20 DIAGNOSIS — I739 Peripheral vascular disease, unspecified: Secondary | ICD-10-CM

## 2013-05-20 NOTE — Progress Notes (Signed)
05/20/2013 [name]   06/27/1951  [id number]  Primary Physician Gregory Mackintosh, MD Primary Cardiologist: Gregory Gess MD [name]   HPI:  The patient is a very pleasant 62 year old, thin-appearing, married Caucasian male, father of 1, grandfather to 4 grandchildren who I last saw 6 months ago. He has a history of coronary artery disease status post bypass grafting x5 by Dr. Andrey [name] June 16, 2008, with a free LIMA to his LAD, sequential vein to the first diagonal branch, first and second obtuse marginal branches, as well as a vein to the PDA. His other problems include hypertension and hyperlipidemia. He denies chest pain or shortness of breath. I did perform cerebral angiography on him May 05, 2008, because of a question of a high-grade internal carotid artery stenosis read by the radiologist. However, this turned out to be a left external carotid artery stenosis. He does have high-grade ostial left internal mammary artery stenosis which necessitated the use of a free LIMA. He is statin intolerant. 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 A Myoview stress test performed December 11, 2011, was normal and carotid Dopplers did show a moderately severe right ICA stenosis which we have been following by duplex ultrasound. 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 Since I saw him one year ago he remains with asymptomatic. He denies chest pain or shortness of breath.     Current Outpatient Prescriptions  Medication Sig Dispense Refill  . amLODipine (NORVASC) 10 MG tablet TAKE 1 TABLET BY MOUTH ONCE DAILY  30 tablet  11  . amphetamine-dextroamphetamine (ADDERALL) 30 MG tablet Take 30 mg by mouth 3 (three) times daily.        Marland Kitchen aspirin 81  MG tablet Take 81 mg by mouth daily.      Marland Kitchen ezetimibe (ZETIA) 10 MG tablet Take 1 tablet (10 mg total) by mouth daily.  30 tablet  6  . fish oil-omega-3 fatty acids 1000 MG capsule Take 2 g by mouth daily.        [name] Oil 300 MG CAPS Take 300 mg by mouth daily.      Marland Kitchen lisinopril (PRINIVIL,ZESTRIL) 10 MG tablet TAKE 1 TABLET BY MOUTH ONCE DAILY  30 tablet  11  . LORazepam (ATIVAN) 1 MG tablet Take 1 mg by mouth every 8 (eight) hours.      . metoprolol succinate (TOPROL-XL) 25 MG 24 hr tablet Take 1 tablet (25 mg total) by mouth daily.  90 tablet  1  . niacin 500 MG tablet Take 500 mg by mouth daily with breakfast.        . omeprazole (PRILOSEC) 20 MG capsule Take 20 mg by mouth daily.        . tadalafil (CIALIS) 20 MG tablet Take 20 mg by mouth daily as needed for erectile dysfunction.      . Vortioxetine HBr (BRINTELLIX) 20 MG TABS Take 20 mg by mouth daily.      Marland Kitchen ZETIA 10 MG tablet TAKE 1 TABLET BY MOUTH ONCE DAILY  30 tablet  11   No current facility-administered medications for this visit.    Allergies  Allergen Reactions  . Crestor [Rosuvastatin Calcium] Other (See Comments)    Difficulty swallowing  . Lipitor [Atorvastatin Calcium] Other (  See Comments)    Muscle spasms  . Tricor [Fenofibrate]     History   Social History  . Marital Status: Married    Spouse Name: N/A    Number of Children: N/A  . Years of Education: N/A   Occupational History  . Not on file.   Social History Main Topics  . Smoking status: Former Smoker -- 3.00 packs/day for 30 years    Types: Cigarettes    Quit date: 01/29/2001  . Smokeless tobacco: Not on file  . Alcohol Use: 4.2 oz/week    7 Cans of beer per week  . Drug Use: No  . Sexual Activity: Not on file   Other Topics Concern  . Not on file   Social History Narrative  . No narrative on file     Review of Systems: General: negative for chills, fever, night sweats or weight changes.  Cardiovascular: negative for chest pain,  dyspnea on exertion, edema, orthopnea, palpitations, paroxysmal nocturnal dyspnea or shortness of breath Dermatological: negative for rash Respiratory: negative for cough or wheezing Urologic: negative for hematuria Abdominal: negative for nausea, vomiting, diarrhea, bright red blood per rectum, melena, or hematemesis Neurologic: negative for visual changes, syncope, or dizziness All other systems reviewed and are otherwise negative except as noted above.    Blood pressure 110/70, pulse 70, height 5\' 7"  (1.702 m), weight 164 lb (74.39 kg).  General appearance: alert and no distress Neck: no adenopathy, no JVD, supple, symmetrical, trachea midline, thyroid not enlarged, symmetric, no tenderness/mass/nodules and bilateral carotid bruits Lungs: clear to auscultation bilaterally Heart: regular rate and rhythm, S1, S2 normal, no murmur, click, rub or gallop Extremities: extremities normal, atraumatic, no cyanosis or edema  EKG normal sinus rhythm at 70 with right bundle branch block unchanged from prior EKGs  ASSESSMENT AND PLAN:   Coronary artery disease Status post coronary artery bypass grafting x5 by Dr. Andrey [name] 9/16/9 with a free LIMA graft to the LAD, sequential vein to the first diagonal branch, first and second obtuse marginal branches sequentially and to the PDA. He had a Myoview stress test performed 12/11/11 which was nonischemic. He denies chest pain or shortness of breath.  Carotid artery disease I performed angiography on him 05/05/08 revealing noncritical internal carotid artery stenosis. We have been following him by the ultrasound performed most recently 06/07/12 revealing moderately severe right and mild to moderate left eye associated with this. He remains neurologically asymptomatic  Hypertension Controlled on current medications  Hyperlipidemia On Zetia, statin intolerant, with recent LDL of 112      Gregory Gess MD [facility], [facility] 05/20/2013 2:00  PM

## 2013-05-20 NOTE — Assessment & Plan Note (Signed)
Status post coronary artery bypass grafting x5 by Dr. Andrey Spearman 9/16/9 with a free LIMA graft to the LAD, sequential vein to the first diagonal branch, first and second obtuse marginal branches sequentially and to the PDA. He had a Myoview stress test performed 12/11/11 which was nonischemic. He denies chest pain or shortness of breath.

## 2013-05-20 NOTE — Patient Instructions (Addendum)
  We will see you back in follow up after the doppler  Dr Allyson Sabal has ordered carotid doppler

## 2013-05-20 NOTE — Assessment & Plan Note (Signed)
On Zetia, statin intolerant, with recent LDL of 112

## 2013-05-20 NOTE — Assessment & Plan Note (Signed)
Controlled on current medications 

## 2013-05-20 NOTE — Assessment & Plan Note (Signed)
I performed angiography on him 05/05/08 revealing noncritical internal carotid artery stenosis. We have been following him by the ultrasound performed most recently 06/07/12 revealing moderately severe right and mild to moderate left eye associated with this. He remains neurologically asymptomatic

## 2013-06-02 ENCOUNTER — Ambulatory Visit (HOSPITAL_COMMUNITY)
Admission: RE | Admit: 2013-06-02 | Discharge: 2013-06-02 | Disposition: A | Discharge status: Home / Self Care | Payer: 59 | Source: Ambulatory Visit | Attending: Cardiovascular Disease | Admitting: Cardiovascular Disease

## 2013-06-02 DIAGNOSIS — I739 Peripheral vascular disease, unspecified: Secondary | ICD-10-CM

## 2013-06-02 DIAGNOSIS — I6529 Occlusion and stenosis of unspecified carotid artery: Secondary | ICD-10-CM | POA: Insufficient documentation

## 2013-06-02 DIAGNOSIS — I779 Disorder of arteries and arterioles, unspecified: Secondary | ICD-10-CM

## 2013-06-02 NOTE — Progress Notes (Signed)
Carotid Duplex Completed. °Brianna L Mazza,RVT °

## 2013-06-08 ENCOUNTER — Telehealth: Payer: Self-pay | Admitting: *Deleted

## 2013-06-08 ENCOUNTER — Encounter: Payer: Self-pay | Admitting: *Deleted

## 2013-06-08 DIAGNOSIS — I6529 Occlusion and stenosis of unspecified carotid artery: Secondary | ICD-10-CM

## 2013-06-08 NOTE — Telephone Encounter (Signed)
Message copied by Marella Bile on Mon Jun 08, 2013 10:32 PM ------      Message from: [name]      Created: Fri Jun 05, 2013  8:30 AM       No change from prior study. Repeat in 6 months ------

## 2013-06-08 NOTE — Telephone Encounter (Signed)
Carotid doppler order placed for 6 months

## 2013-06-10 ENCOUNTER — Encounter: Payer: 59 | Admitting: Cardiovascular Disease

## 2013-06-10 ENCOUNTER — Encounter: Payer: Self-pay | Admitting: Cardiovascular Disease

## 2013-06-10 NOTE — Progress Notes (Signed)
This encounter was created in error - please disregard.

## 2013-07-03 ENCOUNTER — Other Ambulatory Visit (HOSPITAL_COMMUNITY): Payer: Self-pay | Admitting: Orthopedic Surgery

## 2013-07-03 DIAGNOSIS — M25512 Pain in left shoulder: Secondary | ICD-10-CM

## 2013-07-09 ENCOUNTER — Ambulatory Visit (HOSPITAL_COMMUNITY)
Admission: RE | Admit: 2013-07-09 | Discharge: 2013-07-09 | Disposition: A | Discharge status: Home / Self Care | Payer: 59 | Source: Ambulatory Visit | Attending: Orthopedic Surgery | Admitting: Orthopedic Surgery

## 2013-07-09 DIAGNOSIS — S43499A Other sprain of unspecified shoulder joint, initial encounter: Secondary | ICD-10-CM | POA: Insufficient documentation

## 2013-07-09 DIAGNOSIS — M25512 Pain in left shoulder: Secondary | ICD-10-CM

## 2013-07-09 DIAGNOSIS — M25419 Effusion, unspecified shoulder: Secondary | ICD-10-CM | POA: Insufficient documentation

## 2013-07-09 DIAGNOSIS — X58XXXA Exposure to other specified factors, initial encounter: Secondary | ICD-10-CM | POA: Insufficient documentation

## 2013-07-09 DIAGNOSIS — S46819A Strain of other muscles, fascia and tendons at shoulder and upper arm level, unspecified arm, initial encounter: Secondary | ICD-10-CM | POA: Insufficient documentation

## 2013-07-09 DIAGNOSIS — M25819 Other specified joint disorders, unspecified shoulder: Secondary | ICD-10-CM | POA: Insufficient documentation

## 2013-08-06 ENCOUNTER — Other Ambulatory Visit: Payer: Self-pay

## 2013-08-06 ENCOUNTER — Telehealth: Payer: Self-pay | Admitting: *Deleted

## 2013-08-06 NOTE — Telephone Encounter (Signed)
Surgical clearance form received for shoulder surgery and request to hold plavix.  Dr Allyson Sabal reviewed the chart and gave clearance for surgery and to hold plavix

## 2013-08-19 ENCOUNTER — Other Ambulatory Visit: Payer: Self-pay | Admitting: *Deleted

## 2013-08-19 DIAGNOSIS — I2510 Atherosclerotic heart disease of native coronary artery without angina pectoris: Secondary | ICD-10-CM

## 2013-08-19 DIAGNOSIS — I10 Essential (primary) hypertension: Secondary | ICD-10-CM

## 2013-08-19 DIAGNOSIS — Z125 Encounter for screening for malignant neoplasm of prostate: Secondary | ICD-10-CM

## 2013-08-19 DIAGNOSIS — Z130 Encounter for screening for diseases of the blood and blood-forming organs and certain disorders involving the immune mechanism: Secondary | ICD-10-CM

## 2013-08-19 DIAGNOSIS — E785 Hyperlipidemia, unspecified: Secondary | ICD-10-CM

## 2013-08-19 DIAGNOSIS — E039 Hypothyroidism, unspecified: Secondary | ICD-10-CM

## 2013-08-24 ENCOUNTER — Other Ambulatory Visit: Payer: 59 | Admitting: Internal Medicine

## 2013-08-25 ENCOUNTER — Encounter: Payer: 59 | Admitting: Internal Medicine

## 2013-09-01 ENCOUNTER — Other Ambulatory Visit: Payer: 59 | Admitting: Internal Medicine

## 2013-09-01 DIAGNOSIS — I10 Essential (primary) hypertension: Secondary | ICD-10-CM

## 2013-09-01 DIAGNOSIS — E039 Hypothyroidism, unspecified: Secondary | ICD-10-CM

## 2013-09-01 DIAGNOSIS — Z130 Encounter for screening for diseases of the blood and blood-forming organs and certain disorders involving the immune mechanism: Secondary | ICD-10-CM

## 2013-09-01 DIAGNOSIS — E785 Hyperlipidemia, unspecified: Secondary | ICD-10-CM

## 2013-09-01 LAB — CBC WITH DIFFERENTIAL/PLATELET
Basophils Absolute: 0.1 10*3/uL (ref 0.0–0.1)
Basophils Relative: 2 % — ABNORMAL HIGH (ref 0–1)
Eosinophils Absolute: 0.2 10*3/uL (ref 0.0–0.7)
Eosinophils Relative: 6 % — ABNORMAL HIGH (ref 0–5)
Lymphs Abs: 0.9 10*3/uL (ref 0.7–4.0)
MCH: 30.5 pg (ref 26.0–34.0)
MCV: 87 fL (ref 78.0–100.0)
Neutrophils Relative %: 56 % (ref 43–77)
Platelets: 176 10*3/uL (ref 150–400)
RBC: 5.01 MIL/uL (ref 4.22–5.81)
RDW: 13.7 % (ref 11.5–15.5)

## 2013-09-01 LAB — LIPID PANEL
Cholesterol: 221 mg/dL — ABNORMAL HIGH (ref 0–200)
HDL: 66 mg/dL (ref >39)
Total CHOL/HDL Ratio: 3.3 Ratio
VLDL: 26 mg/dL (ref 0–40)

## 2013-09-01 LAB — COMPREHENSIVE METABOLIC PANEL
ALT: 29 U/L (ref 0–53)
AST: 27 U/L (ref 0–37)
BUN: 12 mg/dL (ref 6–23)
Calcium: 9.1 mg/dL (ref 8.4–10.5)
Creat: 0.98 mg/dL (ref 0.50–1.35)
Total Bilirubin: 0.5 mg/dL (ref 0.3–1.2)

## 2013-09-01 LAB — TSH: TSH: 9.162 u[IU]/mL — ABNORMAL HIGH (ref 0.350–4.500)

## 2013-09-03 ENCOUNTER — Encounter: Payer: Self-pay | Admitting: Internal Medicine

## 2013-09-03 ENCOUNTER — Ambulatory Visit (INDEPENDENT_AMBULATORY_CARE_PROVIDER_SITE_OTHER): Payer: 59 | Admitting: Internal Medicine

## 2013-09-03 VITALS — BP 120/80 | HR 80 | Temp 97.4°F | Ht 67.0 in | Wt 169.0 lb

## 2013-09-03 DIAGNOSIS — Z8679 Personal history of other diseases of the circulatory system: Secondary | ICD-10-CM

## 2013-09-03 DIAGNOSIS — H02409 Unspecified ptosis of unspecified eyelid: Secondary | ICD-10-CM

## 2013-09-03 DIAGNOSIS — Z23 Encounter for immunization: Secondary | ICD-10-CM

## 2013-09-03 DIAGNOSIS — Z8659 Personal history of other mental and behavioral disorders: Secondary | ICD-10-CM

## 2013-09-03 DIAGNOSIS — Z789 Other specified health status: Secondary | ICD-10-CM

## 2013-09-03 DIAGNOSIS — F988 Other specified behavioral and emotional disorders with onset usually occurring in childhood and adolescence: Secondary | ICD-10-CM

## 2013-09-03 DIAGNOSIS — Z0000 Encounter for general adult medical examination without abnormal findings: Secondary | ICD-10-CM

## 2013-09-03 DIAGNOSIS — K219 Gastro-esophageal reflux disease without esophagitis: Secondary | ICD-10-CM

## 2013-09-03 DIAGNOSIS — M5090 Cervical disc disorder, unspecified, unspecified cervical region: Secondary | ICD-10-CM

## 2013-09-03 DIAGNOSIS — C029 Malignant neoplasm of tongue, unspecified: Secondary | ICD-10-CM

## 2013-09-03 DIAGNOSIS — I10 Essential (primary) hypertension: Secondary | ICD-10-CM

## 2013-09-03 DIAGNOSIS — E785 Hyperlipidemia, unspecified: Secondary | ICD-10-CM

## 2013-09-03 DIAGNOSIS — N529 Male erectile dysfunction, unspecified: Secondary | ICD-10-CM

## 2013-09-03 DIAGNOSIS — H02402 Unspecified ptosis of left eyelid: Secondary | ICD-10-CM

## 2013-09-03 DIAGNOSIS — M545 Low back pain: Secondary | ICD-10-CM

## 2013-09-03 DIAGNOSIS — E039 Hypothyroidism, unspecified: Secondary | ICD-10-CM

## 2013-09-03 DIAGNOSIS — I779 Disorder of arteries and arterioles, unspecified: Secondary | ICD-10-CM

## 2013-09-03 MED ORDER — LEVOTHYROXINE SODIUM 75 MCG PO TABS: 75.0000 ug | ORAL_TABLET | Freq: Every day | ORAL | Status: DC

## 2013-09-14 ENCOUNTER — Encounter (HOSPITAL_COMMUNITY): Payer: Self-pay | Admitting: Respiratory Therapy

## 2013-09-18 ENCOUNTER — Encounter (HOSPITAL_COMMUNITY): Payer: Self-pay

## 2013-09-18 ENCOUNTER — Encounter (HOSPITAL_COMMUNITY)
Admission: RE | Admit: 2013-09-18 | Discharge: 2013-09-18 | Disposition: A | Discharge status: Home / Self Care | Payer: 59 | Source: Ambulatory Visit | Attending: Orthopedic Surgery | Admitting: Orthopedic Surgery

## 2013-09-18 ENCOUNTER — Ambulatory Visit (HOSPITAL_COMMUNITY): Admission: RE | Admit: 2013-09-18 | Payer: 59 | Source: Ambulatory Visit

## 2013-09-18 ENCOUNTER — Ambulatory Visit (HOSPITAL_COMMUNITY)
Admission: RE | Admit: 2013-09-18 | Discharge: 2013-09-18 | Disposition: A | Discharge status: Home / Self Care | Payer: 59 | Source: Ambulatory Visit | Attending: Orthopedic Surgery | Admitting: Orthopedic Surgery

## 2013-09-18 DIAGNOSIS — I6529 Occlusion and stenosis of unspecified carotid artery: Secondary | ICD-10-CM | POA: Insufficient documentation

## 2013-09-18 DIAGNOSIS — Z951 Presence of aortocoronary bypass graft: Secondary | ICD-10-CM | POA: Insufficient documentation

## 2013-09-18 DIAGNOSIS — J449 Chronic obstructive pulmonary disease, unspecified: Secondary | ICD-10-CM | POA: Insufficient documentation

## 2013-09-18 DIAGNOSIS — Z87891 Personal history of nicotine dependence: Secondary | ICD-10-CM | POA: Insufficient documentation

## 2013-09-18 DIAGNOSIS — M47814 Spondylosis without myelopathy or radiculopathy, thoracic region: Secondary | ICD-10-CM | POA: Insufficient documentation

## 2013-09-18 DIAGNOSIS — I10 Essential (primary) hypertension: Secondary | ICD-10-CM | POA: Insufficient documentation

## 2013-09-18 DIAGNOSIS — J4489 Other specified chronic obstructive pulmonary disease: Secondary | ICD-10-CM | POA: Insufficient documentation

## 2013-09-18 DIAGNOSIS — F3289 Other specified depressive episodes: Secondary | ICD-10-CM | POA: Insufficient documentation

## 2013-09-18 DIAGNOSIS — Z01812 Encounter for preprocedural laboratory examination: Secondary | ICD-10-CM | POA: Insufficient documentation

## 2013-09-18 DIAGNOSIS — E785 Hyperlipidemia, unspecified: Secondary | ICD-10-CM | POA: Insufficient documentation

## 2013-09-18 DIAGNOSIS — Z923 Personal history of irradiation: Secondary | ICD-10-CM | POA: Insufficient documentation

## 2013-09-18 DIAGNOSIS — Z85810 Personal history of malignant neoplasm of tongue: Secondary | ICD-10-CM | POA: Insufficient documentation

## 2013-09-18 DIAGNOSIS — I658 Occlusion and stenosis of other precerebral arteries: Secondary | ICD-10-CM | POA: Insufficient documentation

## 2013-09-18 DIAGNOSIS — Z01818 Encounter for other preprocedural examination: Secondary | ICD-10-CM | POA: Insufficient documentation

## 2013-09-18 DIAGNOSIS — F329 Major depressive disorder, single episode, unspecified: Secondary | ICD-10-CM | POA: Insufficient documentation

## 2013-09-18 DIAGNOSIS — I4510 Unspecified right bundle-branch block: Secondary | ICD-10-CM | POA: Insufficient documentation

## 2013-09-18 DIAGNOSIS — E039 Hypothyroidism, unspecified: Secondary | ICD-10-CM | POA: Insufficient documentation

## 2013-09-18 DIAGNOSIS — I2510 Atherosclerotic heart disease of native coronary artery without angina pectoris: Secondary | ICD-10-CM | POA: Insufficient documentation

## 2013-09-18 HISTORY — DX: Hypothyroidism, unspecified: E03.9

## 2013-09-18 LAB — CBC
HCT: 45 % (ref 39.0–52.0)
MCH: 31.9 pg (ref 26.0–34.0)
MCV: 88.1 fL (ref 78.0–100.0)
RDW: 13 % (ref 11.5–15.5)
WBC: 5.4 10*3/uL (ref 4.0–10.5)

## 2013-09-18 LAB — BASIC METABOLIC PANEL
Chloride: 102 mEq/L (ref 96–112)
Creatinine, Ser: 0.98 mg/dL (ref 0.50–1.35)
GFR calc Af Amer: >90 mL/min (ref >90)
Glucose, Bld: 120 mg/dL — ABNORMAL HIGH (ref 70–99)
Potassium: 4.1 mEq/L (ref 3.5–5.1)
Sodium: 140 mEq/L (ref 135–145)

## 2013-09-18 NOTE — Pre-Procedure Instructions (Signed)
[name]  09/18/2013   Your procedure is scheduled on:  09/28/13  Report to [facility]  2 * 3 at 1230 AM.  Call this number if you have problems the morning of surgery: [phone]   Remember:   Do not eat food or drink liquids after midnight.   Take these medicines the morning of surgery with A SIP OF WATER: norvasc,adderall,vicodan,synthroid,ativan,prilosec,brintellix,metoprolol   Do not wear jewelry, make-up or nail polish.  Do not wear lotions, powders, or perfumes. You may wear deodorant.  Do not shave 48 hours prior to surgery. Men may shave face and neck.  Do not bring valuables to the hospital.  [facility] is not responsible                  for any belongings or valuables.               Contacts, dentures or bridgework may not be worn into surgery.  Leave suitcase in the car. After surgery it may be brought to your room.  For patients admitted to the hospital, discharge time is determined by your                treatment team.               Patients discharged the day of surgery will not be allowed to drive  home.  Name and phone number of your driver:   Special Instructions: Shower using CHG 2 nights before surgery and the night before surgery.  If you shower the day of surgery use CHG.  Use special wash - you have one bottle of CHG for all showers.  You should use approximately 1/3 of the bottle for each shower.   Please read over the following fact sheets that you were given: Pain Booklet, Coughing and Deep Breathing and Surgical Site Infection Prevention

## 2013-09-21 ENCOUNTER — Encounter (HOSPITAL_COMMUNITY): Payer: Self-pay

## 2013-09-21 NOTE — Progress Notes (Signed)
Anesthesia chart review:  Patient is a 62 year old male scheduled for left shoulder arthroscopy with mini-open rotator cuff repair on 09/28/13 by Dr. Ranell Patrick.   History includes CAD s/p CABG (free LIMA to LAD, SVG to D1, OM1, and OM2, SVG to PDA) X 5 06/16/08, former smoker, HTN, right subclavian stenosis/steal syndrome, carotid artery stenosis, HLD, COPD, hypothyroidism, depression, spinal surgery, SCC of the tongue s/p radiation therapy [facility], C6-7 posterior laminectomy/microdiskectomy '01. PCP is Dr. Marlan Palau.    Cardiologist is Dr. Nanetta Batty who cleared patient for this procedure.  EKG on 05/20/13 showed NSR, right BBB.  Nuclear stress test on 12/11/11 showed fixed basal to mid inferior bowel artifact, but no reversible ischemia, post-stress EF 72%, no significant wall motion abnormalities. Low risk scan.  Echo on 04/16/08 showed no significant wall motion abnormalities, EF > 55%, no significant valvular abnormalities.   It looks like his last cardiac cath was in 2009 prior to his CABG.  Carotid duplex on 06/02/13 showed 50-69% right SCA stenosis, 50-69% right ICA stenosis, known occluded right ECA, left ICA with vessel tortuosity falsely elevating velocities throughout, 70-99% left ECA stenosis.  Six month follow-up recommended.  Preoperative CXR and labs noted.  He has been cleared by his cardiologist.  If no acute changes then I would anticipate that he could proceed as planned.  Velna Ochs [facility]/Anesthesiology Phone [phone] 09/21/2013 3:51 PM

## 2013-09-27 MED ORDER — CHLORHEXIDINE GLUCONATE 4 % EX LIQD: 60.0000 mL | Freq: Once | CUTANEOUS | Status: DC

## 2013-09-27 MED ORDER — CEFAZOLIN SODIUM-DEXTROSE 2-3 GM-% IV SOLR
2.0000 g | INTRAVENOUS | Status: AC
  Administered 2013-09-28: 2 g via INTRAVENOUS
  Filled 2013-09-27: qty 50

## 2013-09-28 ENCOUNTER — Ambulatory Visit (HOSPITAL_COMMUNITY): Payer: 59 | Admitting: Certified Registered Nurse Anesthetist

## 2013-09-28 ENCOUNTER — Ambulatory Visit (HOSPITAL_COMMUNITY)
Admission: RE | Admit: 2013-09-28 | Discharge: 2013-09-28 | Disposition: A | Discharge status: Home / Self Care | Payer: 59 | Source: Ambulatory Visit | Attending: Orthopedic Surgery | Admitting: Orthopedic Surgery

## 2013-09-28 ENCOUNTER — Encounter (HOSPITAL_COMMUNITY)
Admission: RE | Disposition: A | Discharge status: Home / Self Care | Payer: Self-pay | Source: Ambulatory Visit | Attending: Orthopedic Surgery

## 2013-09-28 ENCOUNTER — Encounter (HOSPITAL_COMMUNITY): Payer: 59 | Admitting: Vascular Surgery

## 2013-09-28 ENCOUNTER — Encounter (HOSPITAL_COMMUNITY): Payer: Self-pay | Admitting: Surgery

## 2013-09-28 DIAGNOSIS — F329 Major depressive disorder, single episode, unspecified: Secondary | ICD-10-CM | POA: Insufficient documentation

## 2013-09-28 DIAGNOSIS — M19019 Primary osteoarthritis, unspecified shoulder: Secondary | ICD-10-CM | POA: Insufficient documentation

## 2013-09-28 DIAGNOSIS — I10 Essential (primary) hypertension: Secondary | ICD-10-CM | POA: Insufficient documentation

## 2013-09-28 DIAGNOSIS — J449 Chronic obstructive pulmonary disease, unspecified: Secondary | ICD-10-CM | POA: Insufficient documentation

## 2013-09-28 DIAGNOSIS — E039 Hypothyroidism, unspecified: Secondary | ICD-10-CM | POA: Insufficient documentation

## 2013-09-28 DIAGNOSIS — M75120 Complete rotator cuff tear or rupture of unspecified shoulder, not specified as traumatic: Secondary | ICD-10-CM | POA: Insufficient documentation

## 2013-09-28 DIAGNOSIS — E785 Hyperlipidemia, unspecified: Secondary | ICD-10-CM | POA: Insufficient documentation

## 2013-09-28 DIAGNOSIS — I6529 Occlusion and stenosis of unspecified carotid artery: Secondary | ICD-10-CM | POA: Insufficient documentation

## 2013-09-28 DIAGNOSIS — J4489 Other specified chronic obstructive pulmonary disease: Secondary | ICD-10-CM | POA: Insufficient documentation

## 2013-09-28 DIAGNOSIS — Z87891 Personal history of nicotine dependence: Secondary | ICD-10-CM | POA: Insufficient documentation

## 2013-09-28 DIAGNOSIS — I2510 Atherosclerotic heart disease of native coronary artery without angina pectoris: Secondary | ICD-10-CM | POA: Insufficient documentation

## 2013-09-28 DIAGNOSIS — M24119 Other articular cartilage disorders, unspecified shoulder: Secondary | ICD-10-CM | POA: Insufficient documentation

## 2013-09-28 DIAGNOSIS — Z7982 Long term (current) use of aspirin: Secondary | ICD-10-CM | POA: Insufficient documentation

## 2013-09-28 DIAGNOSIS — Z5333 Arthroscopic surgical procedure converted to open procedure: Secondary | ICD-10-CM | POA: Insufficient documentation

## 2013-09-28 HISTORY — PX: SHOULDER ARTHROSCOPY WITH SUBACROMIAL DECOMPRESSION AND OPEN ROTATOR C: SHX5688

## 2013-09-28 SURGERY — SHOULDER ARTHROSCOPY WITH SUBACROMIAL DECOMPRESSION AND OPEN ROTATOR CUFF REPAIR, OPEN BICEPS TENDON REPAIR
Anesthesia: Regional | Site: Shoulder | Laterality: Left

## 2013-09-28 MED ORDER — SODIUM CHLORIDE 0.9 % IR SOLN
Status: DC | PRN
  Administered 2013-09-28: 6000 mL

## 2013-09-28 MED ORDER — FENTANYL CITRATE 0.05 MG/ML IJ SOLN
50.0000 ug | INTRAMUSCULAR | Status: DC | PRN
  Administered 2013-09-28: 50 ug via INTRAVENOUS

## 2013-09-28 MED ORDER — METHOCARBAMOL 500 MG PO TABS: 500.0000 mg | ORAL_TABLET | Freq: Three times a day (TID) | ORAL | Status: DC | PRN

## 2013-09-28 MED ORDER — ALBUMIN HUMAN 5 % IV SOLN
12.5000 g | Freq: Once | INTRAVENOUS | Status: AC
  Administered 2013-09-28: 12.5 g via INTRAVENOUS

## 2013-09-28 MED ORDER — NEOSTIGMINE METHYLSULFATE 1 MG/ML IJ SOLN
INTRAMUSCULAR | Status: DC | PRN
  Administered 2013-09-28: 3 mg via INTRAVENOUS

## 2013-09-28 MED ORDER — PROPOFOL 10 MG/ML IV BOLUS
INTRAVENOUS | Status: DC | PRN
  Administered 2013-09-28: 170 mg via INTRAVENOUS

## 2013-09-28 MED ORDER — FENTANYL CITRATE 0.05 MG/ML IJ SOLN
INTRAMUSCULAR | Status: AC
  Administered 2013-09-28: 50 ug via INTRAVENOUS
  Filled 2013-09-28: qty 2

## 2013-09-28 MED ORDER — OXYCODONE HCL 5 MG/5ML PO SOLN: 5.0000 mg | Freq: Once | ORAL | Status: DC | PRN

## 2013-09-28 MED ORDER — PHENYLEPHRINE HCL 10 MG/ML IJ SOLN
10.0000 mg | INTRAVENOUS | Status: DC | PRN
  Administered 2013-09-28: 80 ug/min via INTRAVENOUS

## 2013-09-28 MED ORDER — LIDOCAINE HCL (CARDIAC) 20 MG/ML IV SOLN
INTRAVENOUS | Status: DC | PRN
  Administered 2013-09-28: 80 mg via INTRAVENOUS

## 2013-09-28 MED ORDER — LACTATED RINGERS IV BOLUS (SEPSIS)
1000.0000 mL | INTRAVENOUS | Status: AC
  Administered 2013-09-28: 1000 mL via INTRAVENOUS

## 2013-09-28 MED ORDER — OXYCODONE HCL 5 MG PO TABS: 5.0000 mg | ORAL_TABLET | Freq: Once | ORAL | Status: DC | PRN

## 2013-09-28 MED ORDER — MIDAZOLAM HCL 2 MG/2ML IJ SOLN
1.0000 mg | INTRAMUSCULAR | Status: DC | PRN
  Administered 2013-09-28: 1 mg via INTRAVENOUS

## 2013-09-28 MED ORDER — ONDANSETRON HCL 4 MG/2ML IJ SOLN
INTRAMUSCULAR | Status: DC | PRN
  Administered 2013-09-28: 4 mg via INTRAVENOUS

## 2013-09-28 MED ORDER — FENTANYL CITRATE 0.05 MG/ML IJ SOLN
INTRAMUSCULAR | Status: DC | PRN
  Administered 2013-09-28: 100 ug via INTRAVENOUS

## 2013-09-28 MED ORDER — BUPIVACAINE-EPINEPHRINE 0.25% -1:200000 IJ SOLN
INTRAMUSCULAR | Status: DC | PRN
  Administered 2013-09-28: 6 mL

## 2013-09-28 MED ORDER — BUPIVACAINE-EPINEPHRINE PF 0.5-1:200000 % IJ SOLN
INTRAMUSCULAR | Status: DC | PRN
  Administered 2013-09-28: 30 mL

## 2013-09-28 MED ORDER — GLYCOPYRROLATE 0.2 MG/ML IJ SOLN
INTRAMUSCULAR | Status: DC | PRN
  Administered 2013-09-28: 0.4 mg via INTRAVENOUS

## 2013-09-28 MED ORDER — LACTATED RINGERS IV SOLN
INTRAVENOUS | Status: DC
  Administered 2013-09-28: 11:00:00 via INTRAVENOUS

## 2013-09-28 MED ORDER — BUPIVACAINE-EPINEPHRINE (PF) 0.25% -1:200000 IJ SOLN
INTRAMUSCULAR | Status: AC
  Filled 2013-09-28: qty 30

## 2013-09-28 MED ORDER — LACTATED RINGERS IV SOLN
INTRAVENOUS | Status: DC | PRN
  Administered 2013-09-28 (×3): via INTRAVENOUS

## 2013-09-28 MED ORDER — ONDANSETRON HCL 4 MG/2ML IJ SOLN: 4.0000 mg | Freq: Four times a day (QID) | INTRAMUSCULAR | Status: DC | PRN

## 2013-09-28 MED ORDER — HYDROMORPHONE HCL PF 1 MG/ML IJ SOLN: 0.2500 mg | INTRAMUSCULAR | Status: DC | PRN

## 2013-09-28 MED ORDER — 0.9 % SODIUM CHLORIDE (POUR BTL) OPTIME
TOPICAL | Status: DC | PRN
  Administered 2013-09-28: 1000 mL

## 2013-09-28 MED ORDER — ROCURONIUM BROMIDE 100 MG/10ML IV SOLN
INTRAVENOUS | Status: DC | PRN
  Administered 2013-09-28: 50 mg via INTRAVENOUS

## 2013-09-28 MED ORDER — MIDAZOLAM HCL 2 MG/2ML IJ SOLN
INTRAMUSCULAR | Status: AC
  Administered 2013-09-28: 1 mg via INTRAVENOUS
  Filled 2013-09-28: qty 2

## 2013-09-28 MED ORDER — OXYCODONE-ACETAMINOPHEN 7.5-325 MG PO TABS: 1.0000 | ORAL_TABLET | ORAL | Status: DC | PRN

## 2013-09-28 SURGICAL SUPPLY — 62 items
ANCH SUT 2 1.3X1 LD 1 STRN (Anchor) ×1 IMPLANT
ANCH SUT 360D 2 2 LD 2 STRN (Anchor) ×1 IMPLANT
ANCHOR ALL- SUT RC 2 SUT Y-K (Anchor) ×1 IMPLANT
ANCHOR ALL-SUT FLEX 1.3 Y-KNOT (Anchor) ×1 IMPLANT
ANCHOR ALL-SUT RC 2 SUT Y-K (Anchor) IMPLANT
BIT DRILL 1.3M DISPOSABLE (BIT) ×1 IMPLANT
BLADE SURG 11 STRL SS (BLADE) ×2 IMPLANT
BUR OVAL 4.0 (BURR) ×1 IMPLANT
CLOTH BEACON ORANGE TIMEOUT ST (SAFETY) ×1 IMPLANT
COVER SURGICAL LIGHT HANDLE (MISCELLANEOUS) ×2 IMPLANT
DRAPE INCISE IOBAN 66X45 STRL (DRAPES) ×2 IMPLANT
DRAPE STERI 35X30 U-POUCH (DRAPES) ×2 IMPLANT
DRAPE U-SHAPE 47X51 STRL (DRAPES) ×2 IMPLANT
DRSG EMULSION OIL 3X3 NADH (GAUZE/BANDAGES/DRESSINGS) ×4 IMPLANT
DRSG PAD ABDOMINAL 8X10 ST (GAUZE/BANDAGES/DRESSINGS) ×4 IMPLANT
DURAPREP 26ML APPLICATOR (WOUND CARE) ×2 IMPLANT
ELECT NDL TIP 2.8 STRL (NEEDLE) IMPLANT
ELECT NEEDLE TIP 2.8 STRL (NEEDLE) ×2 IMPLANT
ELECT REM PT RETURN 9FT ADLT (ELECTROSURGICAL) ×2
ELECTRODE REM PT RTRN 9FT ADLT (ELECTROSURGICAL) IMPLANT
GLOVE BIOGEL PI ORTHO PRO 7.5 (GLOVE) ×1
GLOVE BIOGEL PI ORTHO PRO SZ8 (GLOVE) ×1
GLOVE ORTHO TXT STRL SZ7.5 (GLOVE) ×2 IMPLANT
GLOVE PI ORTHO PRO STRL 7.5 (GLOVE) ×1 IMPLANT
GLOVE PI ORTHO PRO STRL SZ8 (GLOVE) ×1 IMPLANT
GLOVE SURG ORTHO 8.5 STRL (GLOVE) ×2 IMPLANT
GOWN STRL REIN XL XLG (GOWN DISPOSABLE) ×6 IMPLANT
KIT BASIN OR (CUSTOM PROCEDURE TRAY) ×2 IMPLANT
KIT ROOM TURNOVER OR (KITS) ×2 IMPLANT
MANIFOLD NEPTUNE II (INSTRUMENTS) ×2 IMPLANT
NDL HYPO 25GX1X1/2 BEV (NEEDLE) ×1 IMPLANT
NDL SPNL 18GX3.5 QUINCKE PK (NEEDLE) ×1 IMPLANT
NDL SUT .5 MAYO 1.404X.05X (NEEDLE) ×1 IMPLANT
NDL SUT 6 .5 CRC .975X.05 MAYO (NEEDLE) ×1 IMPLANT
NEEDLE HYPO 25GX1X1/2 BEV (NEEDLE) ×2 IMPLANT
NEEDLE MAYO TAPER (NEEDLE) ×4
NEEDLE SPNL 18GX3.5 QUINCKE PK (NEEDLE) ×2 IMPLANT
NS IRRIG 1000ML POUR BTL (IV SOLUTION) ×2 IMPLANT
PACK SHOULDER (CUSTOM PROCEDURE TRAY) ×2 IMPLANT
PAD ARMBOARD 7.5X6 YLW CONV (MISCELLANEOUS) ×4 IMPLANT
RESECTOR FULL RADIUS 4.2MM (BLADE) ×2 IMPLANT
SET ARTHROSCOPY TUBING (MISCELLANEOUS) ×2
SET ARTHROSCOPY TUBING LN (MISCELLANEOUS) ×1 IMPLANT
SLING ARM FOAM STRAP LRG (SOFTGOODS) ×1 IMPLANT
SLING ARM FOAM STRAP MED (SOFTGOODS) IMPLANT
SPONGE GAUZE 4X4 12PLY (GAUZE/BANDAGES/DRESSINGS) ×2 IMPLANT
SPONGE LAP 4X18 X RAY DECT (DISPOSABLE) ×1 IMPLANT
STRIP CLOSURE SKIN 1/2X4 (GAUZE/BANDAGES/DRESSINGS) ×2 IMPLANT
SUCTION FRAZIER TIP 10 FR DISP (SUCTIONS) ×1 IMPLANT
SUT FIBERWIRE #2 38 T-5 BLUE (SUTURE)
SUT MNCRL AB 3-0 PS2 18 (SUTURE) ×2 IMPLANT
SUT VIC AB 0 CT2 27 (SUTURE) ×1 IMPLANT
SUT VIC AB 2-0 CT1 27 (SUTURE) ×2
SUT VIC AB 2-0 CT1 TAPERPNT 27 (SUTURE) ×1 IMPLANT
SUT VICRYL 0 CT 1 36IN (SUTURE) ×2 IMPLANT
SUTURE FIBERWR #2 38 T-5 BLUE (SUTURE) IMPLANT
SYR CONTROL 10ML LL (SYRINGE) ×2 IMPLANT
TOWEL OR 17X24 6PK STRL BLUE (TOWEL DISPOSABLE) ×2 IMPLANT
TOWEL OR 17X26 10 PK STRL BLUE (TOWEL DISPOSABLE) ×2 IMPLANT
TUBE CONNECTING 12X1/4 (SUCTIONS) ×1 IMPLANT
WAND 90 DEG TURBOVAC W/CORD (SURGICAL WAND) ×2 IMPLANT
WATER STERILE IRR 1000ML POUR (IV SOLUTION) ×2 IMPLANT

## 2013-09-28 NOTE — Progress Notes (Signed)
Orthopedic Tech Progress Note Patient Details:  [name] 05-23-1951 [id number] Shoulder abduction sling ordered and delivered to OR front desk Ortho Devices Type of Ortho Device: Shoulder abduction pillow Ortho Device/Splint Interventions: Ordered   [place] 09/28/2013, 1:56 PM

## 2013-09-28 NOTE — H&P (Signed)
[name] is an 62 y.o. male.    Chief Complaint: left shoulder pain and weakness  HPI: Pt is a 62 y.o. male complaining of left shoulder pain for multiple months. Pain had continually increased since the beginning. X-rays in the clinic show rotator cuff tear left shoulder. Pt has tried various conservative treatments which have failed to alleviate their symptoms, including injections and therapy. Various options are discussed with the patient. Risks, benefits and expectations were discussed with the patient. Patient understand the risks, benefits and expectations and wishes to proceed with surgery.   PCP:  Margaree Mackintosh, MD  D/C Plans:  Home   PMH: Past Medical History  Diagnosis Date  . CAD (coronary artery disease)   . Squamous cell cancer of tongue   . HTN (hypertension), benign 04/16/2008    echo - EF >55%; no regional wall or valvular abnormalities  . Depression, major   . COPD (chronic obstructive pulmonary disease) 12/11/2011    r/s mv - EF 72%; exercise capacity 13 METS; no exercised induced ischemic EKG changes  . Subclavian steal syndrome 06/17/2012    carotid doppler - R systolic brachial pressure , L ; R subclavian artery - proxmial obsstruction w/ abnormal monophasic waveforms, R ECA known occlusive disease; L ECA narrowing w/ 70-99% diameter reductiona  . Hyperlipidemia   . Carotid artery stenosis   . Hypothyroidism     PSH: Past Surgical History  Procedure Laterality Date  . Spine surgery  2000    c-spine disc/[facility]  . Coronary artery bypass graft  2009    x5; LIMA to LAd, sequential vein to first diagonal branch, first and second obtuse marginal branches; vein to PDA  . Unknown    . Cardiac catheterization  04/26/2008    L circumflex 99% stenosed midportion straddling a marginal branch; RCA total priximally w/ grade 2 L to R collaterals; renal arteries widely patent; LAD 30% segmental proximal stenosis    Social History:  reports that he  quit smoking about 12 years ago. His smoking use included Cigarettes. He has a 90 pack-year smoking history. He does not have any smokeless tobacco history on file. He reports that he drinks about 4.2 ounces of alcohol per week. He reports that he does not use illicit drugs.  Allergies:  Allergies  Allergen Reactions  . Crestor [Rosuvastatin Calcium] Other (See Comments)    Difficulty swallowing  . Lipitor [Atorvastatin Calcium] Other (See Comments)    Muscle spasms  . Tricor [Fenofibrate]     Medications: Current Facility-Administered Medications  Medication Dose Route Frequency Provider Last Rate Last Dose  . ceFAZolin (ANCEF) IVPB 2 g/50 mL premix  2 g Intravenous On Call to OR Thea Gist, PA-C      . chlorhexidine (HIBICLENS) 4 % liquid 4 application  60 mL Topical Once Thea Gist, PA-C       Current Outpatient Prescriptions  Medication Sig Dispense Refill  . amLODipine (NORVASC) 10 MG tablet Take 10 mg by mouth daily.      Marland Kitchen amphetamine-dextroamphetamine (ADDERALL) 30 MG tablet Take 30 mg by mouth 3 (three) times daily.        Marland Kitchen aspirin 81 MG tablet Take 81 mg by mouth daily.      Marland Kitchen ezetimibe (ZETIA) 10 MG tablet Take 1 tablet (10 mg total) by mouth daily.  30 tablet  6  . HYDROcodone-acetaminophen (NORCO/VICODIN) 5-325 MG per tablet Take 1 tablet by mouth every 6 (six) hours as needed for moderate  pain.      . Krill Oil 300 MG CAPS Take 300 mg by mouth daily.      Marland Kitchen levothyroxine (SYNTHROID, LEVOTHROID) 75 MCG tablet Take 1 tablet (75 mcg total) by mouth daily.  90 tablet  3  . lisinopril (PRINIVIL,ZESTRIL) 10 MG tablet Take 10 mg by mouth daily.      Marland Kitchen LORazepam (ATIVAN) 1 MG tablet Take 1 mg by mouth every 8 (eight) hours as needed for anxiety.       . metoprolol succinate (TOPROL-XL) 25 MG 24 hr tablet Take 1 tablet (25 mg total) by mouth daily.  90 tablet  1  . niacin 500 MG tablet Take 500 mg by mouth daily with breakfast.        . omeprazole (PRILOSEC) 20 MG capsule  Take 20 mg by mouth daily.        . tadalafil (CIALIS) 20 MG tablet Take 20 mg by mouth daily as needed for erectile dysfunction.      . Vortioxetine HBr (BRINTELLIX) 20 MG TABS Take 20 mg by mouth daily.        No results found for this or any previous visit (from the past 48 hour(s)). No results found.  ROS: Pain with rom of the left upper extremity  Physical Exam:  Alert and oriented 62 y.o. male in no acute distress Cranial nerves 2-12 intact Cervical spine: full rom with no tenderness, nv intact distally Chest: active breath sounds bilaterally, no wheeze rhonchi or rales Heart: regular rate and rhythm, no murmur Abd: non tender non distended with active bowel sounds Hip is stable with rom  Left upper extremity with strength 4/5 with ER and IR  No rashes or edema Mild ac tenderness  Assessment/Plan Assessment: left shoulder pain/weakness secondary to rotator cuff tear  Plan: Patient will undergo a left shoulder arthroscopy and cuff repair by Dr. Ranell Patrick at [facility]. Risks benefits and expectations were discussed with the patient. Patient understand risks, benefits and expectations and wishes to proceed.

## 2013-09-28 NOTE — Anesthesia Postprocedure Evaluation (Signed)
Anesthesia Post Note  Patient: [name]  Procedure(s) Performed: Procedure(s) (LRB): LEFT SHOULDER ARTHROSCOPY WITH SUBACROMIAL DECOMPRESSION AND MINI OPEN ROTATOR CUFF REPAIR, OPEN DISTAL CLAVICLE RESECTION AND OPEN BICEP TENDODESIS  (Left)  Anesthesia type: General  Patient location: PACU  Post pain: Pain level controlled  Post assessment: Patient's Cardiovascular Status Stable  Last Vitals:  Filed Vitals:   09/28/13 1552  BP:   Pulse: 57  Temp: 35.9 C  Resp: 16    Post vital signs: Reviewed and stable  Level of consciousness: alert  Complications: No apparent anesthesia complications

## 2013-09-28 NOTE — Progress Notes (Signed)
Dr. Chaney Malling called to bedside, aware of low BP. Pt A&O but reports feeling "a little dizzy". IV bolus given as ordered and pt placed in Trendelenburg, will monitor.

## 2013-09-28 NOTE — Anesthesia Procedure Notes (Addendum)
Anesthesia Regional Block:  Interscalene brachial plexus block  Pre-Anesthetic Checklist: ,, timeout performed, Correct Patient, Correct Site, Correct Laterality, Correct Procedure, Correct Position, site marked, Risks and benefits discussed,  Surgical consent,  Pre-op evaluation,  At surgeon's request and post-op pain management  Laterality: Left  Prep: chloraprep       Needles:  Injection technique: Single-shot  Needle Type: Echogenic Stimulator Needle     Needle Length: 5cm 5 cm Needle Gauge: 22 and 22 G    Additional Needles:  Procedures: ultrasound guided (picture in chart) and nerve stimulator Interscalene brachial plexus block  Nerve Stimulator or Paresthesia:  Response: biceps flexion, 0.45 mA,   Additional Responses:   Narrative:  Start time: 09/28/2013 11:40 AM End time: 09/28/2013 11:55 AM Injection made incrementally with aspirations every 5 mL.  Performed by: Personally  Anesthesiologist: Dr Chaney Malling  Additional Notes: Functioning IV was confirmed and monitors were applied.  A 50mm 22ga Arrow echogenic stimulator needle was used. Sterile prep and drape,hand hygiene and sterile gloves were used.  Negative aspiration and negative test dose prior to incremental administration of local anesthetic. The patient tolerated the procedure well.  Ultrasound guidance: relevent anatomy identified, needle position confirmed, local anesthetic spread visualized around nerve(s), vascular puncture avoided.  Image printed for medical record.    Procedure Name: Intubation Date/Time: 09/28/2013 1:12 PM Performed by: Rogelia Boga Pre-anesthesia Checklist: Patient identified, Emergency Drugs available, Suction available, Patient being monitored and Timeout performed Patient Re-evaluated:Patient Re-evaluated prior to inductionOxygen Delivery Method: Circle system utilized Preoxygenation: Pre-oxygenation with 100% oxygen Intubation Type: IV induction Ventilation: Mask  ventilation without difficulty Laryngoscope Size: Mac and 4 Grade View: Grade I Tube type: Oral Tube size: 7.5 mm Number of attempts: 1 Airway Equipment and Method: Stylet Placement Confirmation: ETT inserted through vocal cords under direct vision,  positive ETCO2 and breath sounds checked- equal and bilateral Secured at: 22 cm Tube secured with: Tape Dental Injury: Teeth and Oropharynx as per pre-operative assessment

## 2013-09-28 NOTE — Anesthesia Preprocedure Evaluation (Addendum)
Anesthesia Evaluation  Patient identified by MRN, date of birth, ID band Patient awake    Reviewed: Allergy & Precautions, H&P , NPO status , Patient's Chart, lab work & pertinent test results  Airway Mallampati: II TM Distance: >3 FB Neck ROM: full    Dental  (+) Teeth Intact   Pulmonary COPDformer smoker,          Cardiovascular hypertension, Pt. on medications and Pt. on home beta blockers + CAD, + CABG and + Peripheral Vascular Disease     Neuro/Psych Anxiety Depression    GI/Hepatic GERD-  Medicated and Controlled,  Endo/Other  Hypothyroidism   Renal/GU      Musculoskeletal   Abdominal   Peds  Hematology   Anesthesia Other Findings   Reproductive/Obstetrics                          Anesthesia Physical Anesthesia Plan  ASA: III  Anesthesia Plan: General and Regional   Post-op Pain Management: MAC Combined w/ Regional for Post-op pain   Induction: Intravenous  Airway Management Planned: Oral ETT  Additional Equipment:   Intra-op Plan:   Post-operative Plan: Extubation in OR  Informed Consent: I have reviewed the patients History and Physical, chart, labs and discussed the procedure including the risks, benefits and alternatives for the proposed anesthesia with the patient or authorized representative who has indicated his/her understanding and acceptance.     Plan Discussed with: CRNA, Anesthesiologist and Surgeon  Anesthesia Plan Comments:         Anesthesia Quick Evaluation

## 2013-09-28 NOTE — Transfer of Care (Signed)
Immediate Anesthesia Transfer of Care Note  Patient: [name]  Procedure(s) Performed: Procedure(s): LEFT SHOULDER ARTHROSCOPY WITH SUBACROMIAL DECOMPRESSION AND MINI OPEN ROTATOR CUFF REPAIR, OPEN DISTAL CLAVICLE RESECTION AND OPEN BICEP TENDODESIS  (Left)  Patient Location: PACU  Anesthesia Type:General and GA combined with regional for post-op pain  Level of Consciousness: awake, alert , oriented, sedated and patient cooperative  Airway & Oxygen Therapy: Patient Spontanous Breathing and Patient connected to nasal cannula oxygen  Post-op Assessment: Report given to PACU RN and Post -op Vital signs reviewed and stable  Post vital signs: Reviewed and stable  Complications: No apparent anesthesia complications

## 2013-09-28 NOTE — Brief Op Note (Signed)
09/28/2013  3:10 PM  PATIENT:  [name]  62 y.o. male  PRE-OPERATIVE DIAGNOSIS:  LEFT SHOULDER ROTATOR CUFF TEAR AND SLAP TEAR, AC  JOINT OSTEOARTHRITIS   POST-OPERATIVE DIAGNOSIS:  LEFT SHOULDER ROTATOR CUFF TEAR AND SLAP TEAR, AC JOINT OSTEOARTHRITIS   PROCEDURE:  Procedure(s): LEFT SHOULDER ARTHROSCOPY WITH SUBACROMIAL DECOMPRESSION AND MINI OPEN ROTATOR CUFF REPAIR, OPEN DISTAL CLAVICLE RESECTION AND OPEN BICEP TENDODESIS  (Left) extensive intra-articular debridement of SLAP tear, Biceps tenotomy  SURGEON:  Surgeon(s) and Role:    * [name], MD - Primary  PHYSICIAN ASSISTANT:   ASSISTANTS: Thea Gist, PA-C   ANESTHESIA:   regional and general  EBL:  Total I/O In: 2000 [I.V.:2000] Out: 100 [Blood:100]  BLOOD ADMINISTERED:none  DRAINS: none   LOCAL MEDICATIONS USED:  MARCAINE     SPECIMEN:  No Specimen  DISPOSITION OF SPECIMEN:  N/A  COUNTS:  YES  TOURNIQUET:  * No tourniquets in log *  DICTATION: .Other Dictation: Dictation Number [id number]  PLAN OF CARE: Discharge to home after PACU  PATIENT DISPOSITION:  PACU - hemodynamically stable.   Delay start of Pharmacological VTE agent (>24hrs) due to surgical blood loss or risk of bleeding: not applicable

## 2013-09-28 NOTE — Progress Notes (Signed)
CRNA at bedside, IV Albumin 5% started per CRNA, will monitor.

## 2013-09-28 NOTE — Progress Notes (Signed)
CRNA and Dr. Chaney Malling back at bedside, BP remains low to spite 1000 ml IV bolus. Pt remains A&O, neuro WNL, will monitor.

## 2013-09-28 NOTE — Interval H&P Note (Signed)
History and Physical Interval Note:  09/28/2013 12:54 PM  [name]  has presented today for surgery, with the diagnosis of LEFT SHOULDER ROTATOR CUFF TEAR AND SLAP TEAR OSTEOARTHRITIS   The various methods of treatment have been discussed with the patient and family. After consideration of risks, benefits and other options for treatment, the patient has consented to  Procedure(s): LEFT SHOULDER ARTHROSCOPY WITH SUBACROMIAL DECOMPRESSION AND MINI OPEN ROTATOR CUFF REPAIR, OPEN DISTAL CLAVICLE RESECTION AND OPEN BICEP TENDODESIS  (Left) as a surgical intervention .  The patient's history has been reviewed, patient examined, no change in status, stable for surgery.  I have reviewed the patient's chart and labs.  Questions were answered to the patient's satisfaction.     Lerlene Treadwell,STEVEN R

## 2013-09-29 ENCOUNTER — Encounter (HOSPITAL_COMMUNITY): Payer: Self-pay | Admitting: Orthopedic Surgery

## 2013-09-29 NOTE — Op Note (Signed)
[name], [name]               ACCOUNT NO.:  [id number]  MEDICAL RECORD NO.:  [id number]  LOCATION:  [facility]                         FACILITY:  [facility]  PHYSICIAN:  Almedia Balls. Ranell Patrick, M.D. DATE OF BIRTH:  August 16, 1951  DATE OF PROCEDURE:  09/28/2013 DATE OF DISCHARGE:  09/28/2013                              OPERATIVE REPORT   PREOPERATIVE DIAGNOSIS:  Left shoulder rotator cuff tear, superior labrum anterior and posterior lesion, and acromioclavicular joint arthritis.  POSTOPERATIVE DIAGNOSIS:  Left shoulder rotator cuff tear, superior labrum anterior and posterior lesion, and acromioclavicular joint arthritis.  PROCEDURE PERFORMED:  Left shoulder arthroscopy with extensive intra- articular debridement of torn superior labrum, anterior-posterior biceps tenotomy, arthroscopic subacromial decompression with mini-open rotator cuff repair and open biceps tenodesis in the groove, also open distal clavicle resection.  ATTENDING SURGEON:  Almedia Balls. Ranell Patrick, MD.  ASSISTANT:  Donnie Coffin. Dixon, PA-C, who was scrubbed the entire procedure and necessary for satisfactory completion of surgery.  General anesthesia was used plus interscalene block.  ESTIMATED BLOOD LOSS:  Minimal.  FLUID REPLACEMENT:  1200 mL crystalloids.  INSTRUMENT COUNTS:  Correct.  There were no complications.  Perioperative antibiotics were given.  INDICATIONS:  The patient is a 62 year old male with worsening left shoulder pain secondary to MRI documented rotator cuff tear, SLAP lesion, and advanced AC arthritis without limb impingement.  The patient has failed all measures of conservative management.  Desires operative treatment to restore function, eliminate pain.  Informed consent obtained.  DESCRIPTION OF PROCEDURE:  After an adequate level of anesthesia was achieved, the patient was positioned in the modified beach-chair position.  Left shoulder correctly identified, and a time-out was called.  The exam  under anesthesia was performed, revealing full passive range of motion with no undue stiffness and no instability.  After sterile prep and drape of the shoulder, we entered the shoulder in standard portals including anterior, posterior, and lateral portals.  We identified significant damage to the superior labral biceps junction, performed a biceps tenotomy and labral debridement, back to stable labral rim.  The majority of the superior labrum had to be debrided. The anteroinferior, posteroinferior labrum was frayed especially posteroinferior with some tearing which we debrided using basket forceps, motorized shaver.  Subscapularis visualized and intact. Rotator cuff torn in the supraspinatus region.  Infraspinatus teres minor appeared normal.  Articular cartilage was in good shape with minimal chondromalacia.  Following completion of our intra-articular debridement and tenotomy, we went ahead and placed the scope in subacromial space.  Thorough bursectomy and acromioplasty was performed creating a type 1 acromial shape using a butcher block technique with a motorized high-speed bur.  Once we were thoroughly decompressed in the rotator cuff outlet all the way up to the [facility] joint now up to the lateral side, we inspected the cuff.  There was clearly a full-thickness rotator cuff tear involving the supraspinatus, which was a U-shaped tear with minimal retraction.  Next, we went ahead and concluded the arthroscopic portion procedure.  Then, made a small saber incision overlying the AC joint.  Dissection down through subcutaneous tissues using a needle-tip Bovie.  Identified deltotrapezius fascia, divided that in line with distal clavicle.  Subperiosteal  dissection of distal clavicle performed. We then went ahead and resected the distal 2 mm of bone using an oscillating saw.  We used a rongeur to remove extra dorsal bone off the distal clavicle.  There were the osteophyte was beginning to form,  so we thus flatten out the distal portion of the clavicle.  We also removed osteophyte on the dorsal aspect of the acromion at the [facility] joint margin. We then thoroughly irrigated the joint, applied bone wax to the cut in the clavicle.  We irrigated again removing excess debris and then repaired the deltotrapezius fascia anatomically with 0 Vicryl suture either simple or figure-of-eight sutures.  Then, we went to 2-0 Vicryl subcutaneous closure and 4-0 Monocryl for skin.  At this point, we went ahead and addressed the rotator cuff tear and a biceps tenodesis through same mini-open incision.  We started the anterolateral border of the acromion staying distally about 3 cm to 4 cm dissection down through subcutaneous tissues.  The deltoid raphe between the anterior and lateral heads were identified and divided sharply using needle-tip Bovie.  We have placed Arthrex retractor, identified the biceps groove, delivered the tendon out of the wound, prepared before the biceps groove with the Bovie and Freer elevator to allow 1st tendon to bone healing. We then placed a single Y-Knot Flex anchor through the floor of the groove, brought that up in a whipstitch fashion through the biceps tendon with the elbow at 90 degrees mid tension for nice low-profile repair.  We oversewed with 0 Vicryl suture.  We next addressed the U- shaped rotator cuff tear.  We mobilized it from more posteromedial to anterolateral using cuff grasper and Cobb elevator on both sides of the tendon.  Once we were able to get that anatomic reduced, we prepared the rotator cuff footprint with a curette and rongeur, placed a single Y- Knot RC anchor at the articular margin, double 0 with #2 hi-fi suture brought that up with a mattress fashion through the medial portion of the tendon footprint.  We then took free sutures out through the lateral portion of footprint, took those through drill holes in bone and also oversewed the anterior  portion of that tear into the upper part of the subscap, so reinforced that rotator cuff interval area, anatomic repair not under tension, range shoulder fully with no impingement noted.  We thoroughly irrigated the subdeltoid interval and then repaired the deltoid anatomically with 0 Vicryl suture followed by 2-0 Vicryl subcutaneous closure and 4-0 Monocryl for skin and portals.  Steri- Strips was applied followed by a sterile dressing.  The patient tolerated the surgery well.     Almedia Balls. Ranell Patrick, M.D.     SRN/MEDQ  D:  09/28/2013  T:  09/29/2013  Job:  [id number]

## 2013-10-02 ENCOUNTER — Ambulatory Visit: Payer: 59 | Attending: Orthopedic Surgery | Admitting: Physical Therapy

## 2013-10-02 DIAGNOSIS — F329 Major depressive disorder, single episode, unspecified: Secondary | ICD-10-CM | POA: Insufficient documentation

## 2013-10-02 DIAGNOSIS — M545 Low back pain, unspecified: Secondary | ICD-10-CM | POA: Insufficient documentation

## 2013-10-02 DIAGNOSIS — Z951 Presence of aortocoronary bypass graft: Secondary | ICD-10-CM | POA: Insufficient documentation

## 2013-10-02 DIAGNOSIS — IMO0001 Reserved for inherently not codable concepts without codable children: Secondary | ICD-10-CM | POA: Insufficient documentation

## 2013-10-02 DIAGNOSIS — I10 Essential (primary) hypertension: Secondary | ICD-10-CM | POA: Insufficient documentation

## 2013-10-02 DIAGNOSIS — F3289 Other specified depressive episodes: Secondary | ICD-10-CM | POA: Insufficient documentation

## 2013-10-02 DIAGNOSIS — F909 Attention-deficit hyperactivity disorder, unspecified type: Secondary | ICD-10-CM | POA: Insufficient documentation

## 2013-10-05 ENCOUNTER — Ambulatory Visit: Payer: 59 | Admitting: Physical Therapy

## 2013-10-06 ENCOUNTER — Ambulatory Visit: Payer: 59 | Admitting: Physical Therapy

## 2013-10-07 ENCOUNTER — Ambulatory Visit: Payer: 59 | Admitting: Physical Therapy

## 2013-10-12 ENCOUNTER — Ambulatory Visit: Payer: 59 | Admitting: Physical Therapy

## 2013-10-14 ENCOUNTER — Ambulatory Visit: Payer: 59 | Admitting: Physical Therapy

## 2013-10-16 ENCOUNTER — Ambulatory Visit: Payer: 59 | Admitting: Physical Therapy

## 2013-10-19 ENCOUNTER — Other Ambulatory Visit: Payer: Self-pay | Admitting: Cardiovascular Disease

## 2013-10-19 ENCOUNTER — Encounter: Payer: Self-pay | Admitting: Internal Medicine

## 2013-10-19 ENCOUNTER — Ambulatory Visit (INDEPENDENT_AMBULATORY_CARE_PROVIDER_SITE_OTHER): Payer: 59 | Admitting: Internal Medicine

## 2013-10-19 ENCOUNTER — Ambulatory Visit: Payer: 59 | Admitting: Physical Therapy

## 2013-10-19 VITALS — BP 96/72 | HR 80 | Temp 97.2°F | Wt 161.5 lb

## 2013-10-19 DIAGNOSIS — J209 Acute bronchitis, unspecified: Secondary | ICD-10-CM

## 2013-10-19 DIAGNOSIS — J111 Influenza due to unidentified influenza virus with other respiratory manifestations: Secondary | ICD-10-CM

## 2013-10-19 MED ORDER — METOPROLOL SUCCINATE ER 25 MG PO TB24: ORAL_TABLET | ORAL | Status: DC

## 2013-10-19 MED ORDER — PROMETHAZINE HCL 25 MG PO TABS: 25.0000 mg | ORAL_TABLET | Freq: Three times a day (TID) | ORAL | Status: DC | PRN

## 2013-10-19 MED ORDER — AZITHROMYCIN 250 MG PO TABS: ORAL_TABLET | ORAL | Status: DC

## 2013-10-19 MED ORDER — BENZONATATE 100 MG PO CAPS: 200.0000 mg | ORAL_CAPSULE | Freq: Three times a day (TID) | ORAL | Status: DC | PRN

## 2013-10-19 NOTE — Progress Notes (Signed)
   Subjective:    Patient ID: [name], male    DOB: 1951-01-18, 63 y.o.   MRN: [id number]  HPI Patient had shoulder surgery in late December and was doing well until last week when he came down with nausea, decreased appetite, chills, malaise, productive cough. No documented fever. Patient looks like he does not feel well and moves slowly. Michela Pitcher he has not been to physical therapy in about a week. Says he was doing well with his therapy until he became sick. Says cough is productive of green sputum. Denies sore throat. Has felt nauseated but has not vomited.    Review of Systems     Objective:   Physical Exam sounds congested when he speaks. TMs are clear. Pharynx slightly injected without exudate. Neck is supple without adenopathy. Chest clear.        Assessment & Plan:  Influenza Bronchitis  Plan: Zithromax take 2 tablets day one followed by 1 tablet days 2 through 5.  Phenergan 25 mg tablets by mouth every 4 hours when necessary nausea. Tessalon Perles 200 mg 3 times a day when necessary cough

## 2013-10-19 NOTE — Patient Instructions (Signed)
Take Z-pak as directed. Take tesslon perles for cough. Take phenergan for nausea.

## 2013-10-21 ENCOUNTER — Ambulatory Visit: Payer: 59 | Admitting: Physical Therapy

## 2013-10-23 ENCOUNTER — Ambulatory Visit: Payer: 59 | Admitting: Physical Therapy

## 2013-10-26 ENCOUNTER — Ambulatory Visit: Payer: 59 | Admitting: Physical Therapy

## 2013-10-28 ENCOUNTER — Ambulatory Visit: Payer: 59 | Admitting: Physical Therapy

## 2013-10-30 ENCOUNTER — Ambulatory Visit: Payer: 59 | Admitting: Rehabilitation

## 2013-10-30 ENCOUNTER — Encounter: Payer: 59 | Admitting: Internal Medicine

## 2013-11-04 ENCOUNTER — Ambulatory Visit: Payer: 59 | Attending: Orthopedic Surgery | Admitting: Rehabilitation

## 2013-11-04 DIAGNOSIS — F909 Attention-deficit hyperactivity disorder, unspecified type: Secondary | ICD-10-CM | POA: Insufficient documentation

## 2013-11-04 DIAGNOSIS — Z951 Presence of aortocoronary bypass graft: Secondary | ICD-10-CM | POA: Insufficient documentation

## 2013-11-04 DIAGNOSIS — F329 Major depressive disorder, single episode, unspecified: Secondary | ICD-10-CM | POA: Insufficient documentation

## 2013-11-04 DIAGNOSIS — M545 Low back pain, unspecified: Secondary | ICD-10-CM | POA: Insufficient documentation

## 2013-11-04 DIAGNOSIS — IMO0001 Reserved for inherently not codable concepts without codable children: Secondary | ICD-10-CM | POA: Insufficient documentation

## 2013-11-04 DIAGNOSIS — F3289 Other specified depressive episodes: Secondary | ICD-10-CM | POA: Insufficient documentation

## 2013-11-04 DIAGNOSIS — I10 Essential (primary) hypertension: Secondary | ICD-10-CM | POA: Insufficient documentation

## 2013-11-06 ENCOUNTER — Ambulatory Visit: Payer: 59 | Admitting: Rehabilitation

## 2013-11-09 ENCOUNTER — Ambulatory Visit: Payer: 59 | Admitting: Rehabilitation

## 2013-11-11 ENCOUNTER — Ambulatory Visit: Payer: 59 | Admitting: Rehabilitation

## 2013-11-13 ENCOUNTER — Ambulatory Visit: Payer: 59 | Admitting: Rehabilitation

## 2013-11-17 ENCOUNTER — Ambulatory Visit: Payer: 59 | Admitting: Rehabilitation

## 2013-11-20 ENCOUNTER — Ambulatory Visit: Payer: 59 | Admitting: Rehabilitation

## 2013-11-23 ENCOUNTER — Ambulatory Visit: Payer: 59 | Admitting: Rehabilitation

## 2013-11-25 ENCOUNTER — Ambulatory Visit: Payer: 59 | Admitting: Physical Therapy

## 2013-12-10 ENCOUNTER — Ambulatory Visit (HOSPITAL_COMMUNITY)
Admission: RE | Admit: 2013-12-10 | Discharge: 2013-12-10 | Disposition: A | Discharge status: Home / Self Care | Payer: 59 | Source: Ambulatory Visit | Attending: Cardiovascular Disease | Admitting: Cardiovascular Disease

## 2013-12-10 ENCOUNTER — Other Ambulatory Visit: Payer: 59 | Admitting: Internal Medicine

## 2013-12-10 DIAGNOSIS — I6529 Occlusion and stenosis of unspecified carotid artery: Secondary | ICD-10-CM | POA: Insufficient documentation

## 2013-12-10 DIAGNOSIS — I739 Peripheral vascular disease, unspecified: Secondary | ICD-10-CM

## 2013-12-10 DIAGNOSIS — E039 Hypothyroidism, unspecified: Secondary | ICD-10-CM

## 2013-12-10 DIAGNOSIS — I779 Disorder of arteries and arterioles, unspecified: Secondary | ICD-10-CM

## 2013-12-10 NOTE — Progress Notes (Signed)
Carotid Duplex Completed. Leighanna Kirn, BS, RDMS, RVT  

## 2013-12-11 LAB — TSH: TSH: 1.129 u[IU]/mL (ref 0.350–4.500)

## 2013-12-11 NOTE — Progress Notes (Signed)
Patient informed. 

## 2013-12-18 ENCOUNTER — Telehealth: Payer: Self-pay | Admitting: *Deleted

## 2013-12-18 DIAGNOSIS — I6529 Occlusion and stenosis of unspecified carotid artery: Secondary | ICD-10-CM

## 2013-12-18 NOTE — Telephone Encounter (Signed)
Order placed for repeat carotid dopplers in 6 months  

## 2013-12-18 NOTE — Telephone Encounter (Signed)
Message copied by Chauncy Lean on Fri Dec 18, 2013  4:20 PM ------      Message from: [name]      Created: Fri Dec 18, 2013  3:42 PM       No change from prior study. Repeat in 6 months ------

## 2014-02-14 NOTE — Patient Instructions (Signed)
Continue same medications and return in 6 months. Diet and exercise recommended.

## 2014-02-14 NOTE — Progress Notes (Signed)
Subjective:    Patient ID: [name], male    DOB: 05-08-1951, 63 y.o.   MRN: [id number]  HPI 63 year old Male in today for health maintenance exam and evaluation of medical issues. History of coronary artery disease, internal carotid artery stenosis, hypertension, hyperlipidemia, anxiety, attention deficit disorder, back pain, cervical disc disease, depression, erectile dysfunction, GE reflux, hypothyroidism, ptosis of the left eye, history of squamous cell carcinoma of the tongue.  Seen here for the first time in March 2011. Formerly seen by Dr. Philipp Deputy.  Squamous cell carcinoma of tongue was treated with radiation therapy in 2002. He later was found to have bilateral carotid bruits and a high-grade right internal carotid stenosis. Because of previous radiation therapy for carcinoma of the tongue, he was not felt to be a good candidate for carotid endarterectomy. He was evaluated for possible stenting and had a cardiac catheterization because a Myoview study showed inferior ischemia. On cardiac cath, he was found to have a total occlusion of the right coronary artery and a tight bifurcation lesion in the circumflex as well as an 80% stenosis in the diagonal branch. He did not have significant left anterior descending artery disease. He did have a tight stenosis in the origin of his left mammary and moderate stenosis in the arch of his right memory. Therefore it was felt he should have a right carotid stent followed by CABG. However when he went to the peripheral vascular lab, he was found only to have 30-40% stenosis of the right internal carotid. The right external carotid was occluded. Therefore stenting was not done.   His CABG was performed September 2009 and had 5 grafts. He is statin intolerant.  Had a discectomy of the C-spine around the year 2000. Patient says he had viral hepatitis a number of years ago. He has tried Lipitor Administrator, arts. He says they cause muscle spasms with  swallowing. Therefore he asked control lipids with diet alone.  He had colonoscopy by Dr. Collene Mares in 2009.  He wears contact lenses.  Developed left eye ptosis after cardiac surgery. Says his face feels numb when he turns his head to the left.  History of depression. History of allergic rhinitis. Had normal spirometry at Dr. Viann Shove office in 2010. Has a diagnosis of adult attention deficit disorder, BPH, seborrheic dermatitis, rectal dysfunction. Was followed previously by Dr. Erling Cruz. Left thalamic stroke June 2005. History of numbness left cheek in the C3 distribution were documented facet disease on the left at C2-C3 causing left foraminal stenosis as well as bifrontal stenosis at C3-C4, C4-C5, C5-C6 as well as C6-C7 which is aggravated by turning his head.  History of migraine headaches. He is ambidextrous. History of hypothyroidism. Dr. love fell patient's carotid disease was due to radiation therapy. Diagnosed with left Horner syndrome. MRI of the brain in 2009 indicated he had right vertebral artery occlusion, 50% stenosis of the right proximal internal carotid artery. Severe narrowing of right external carotid artery. Last carotid duplex was done July 2009.  Dr. Collene Mares has seen him for difficulty swallowing in September 2009. Upper endoscopy showed benign squamous mucosa with mild chronic inflammation on biopsy.  Social history: This is his second marriage. Wife works for [employer]. He smoked for 30 years but quit some 20 years ago. 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 Currently he is not working. He does drink beer. Has one son  from first marriage.  Family history: Mother died with ovarian cancer. Father died apparently with heart failure but also had history of stroke around age 56 and diabetes. Maternal grandmother, paternal grandmother with history of cancer. He has 2 brothers.      Review of  Systems     Objective:   Physical Exam  Vitals reviewed. Constitutional: He is oriented to person, place, and time. He appears well-developed and well-nourished. No distress.  HENT:  Head: Normocephalic and atraumatic.  Right Ear: External ear normal.  Left Ear: External ear normal.  Mouth/Throat: Oropharynx is clear and moist. No oropharyngeal exudate.  Eyes: Conjunctivae and EOM are normal. Pupils are equal, round, and reactive to light. Right eye exhibits no discharge. Left eye exhibits no discharge. No scleral icterus.  Neck: Neck supple. No JVD present. No thyromegaly present.  Cardiovascular: Normal rate, regular rhythm, normal heart sounds and intact distal pulses.   No murmur heard. Pulmonary/Chest: Effort normal and breath sounds normal. No respiratory distress. He has no rales. He exhibits no tenderness.  Abdominal: Bowel sounds are normal. He exhibits no distension and no mass. There is no tenderness. There is no rebound and no guarding.  Genitourinary: Prostate normal.  Musculoskeletal: Normal range of motion. He exhibits no edema.  Lymphadenopathy:    He has no cervical adenopathy.  Neurological: He is alert and oriented to person, place, and time. He displays normal reflexes. No cranial nerve deficit. Coordination normal.  Skin: Skin is warm and dry. No rash noted. He is not diaphoretic.  Psychiatric: He has a normal mood and affect. His behavior is normal. Thought content normal.          Assessment & Plan:  Hyperlipidemia-controlled with diet. Statin intolerant.  Hypertension  Coronary artery disease  Internal carotid artery stenosis not amenable to surgery  History of carcinoma base of tongue status post radiation treatment 2002  Cervical disc disease  Hypothyroidism  History of left thalamic stroke 2005  History of left border syndrome  History of right vertebral artery occlusion  50% stenosis right proximal internal carotid artery and severe  narrowing of right external carotid artery  History of smoking-currently nonsmoker  Statin intolerance  GE reflux  History of left shoulder arthropathy  Anxiety depression  Plan: Return in 6 months.

## 2014-03-09 ENCOUNTER — Other Ambulatory Visit: Payer: 59 | Admitting: Internal Medicine

## 2014-03-09 DIAGNOSIS — Z79899 Other long term (current) drug therapy: Secondary | ICD-10-CM

## 2014-03-09 DIAGNOSIS — E785 Hyperlipidemia, unspecified: Secondary | ICD-10-CM

## 2014-03-09 LAB — LIPID PANEL
CHOLESTEROL: 211 mg/dL — AB (ref 0–200)
HDL: 62 mg/dL (ref >39)
LDL CALC: 120 mg/dL — AB (ref 0–99)
Total CHOL/HDL Ratio: 3.4 Ratio
Triglycerides: 145 mg/dL (ref <150)
VLDL: 29 mg/dL (ref 0–40)

## 2014-03-09 LAB — HEPATIC FUNCTION PANEL
ALBUMIN: 3.8 g/dL (ref 3.5–5.2)
ALK PHOS: 64 U/L (ref 39–117)
ALT: 27 U/L (ref 0–53)
AST: 22 U/L (ref 0–37)
Bilirubin, Direct: 0.1 mg/dL (ref 0.0–0.3)
Indirect Bilirubin: 0.3 mg/dL (ref 0.2–1.2)
TOTAL PROTEIN: 6 g/dL (ref 6.0–8.3)
Total Bilirubin: 0.4 mg/dL (ref 0.2–1.2)

## 2014-03-11 ENCOUNTER — Encounter: Payer: Self-pay | Admitting: Internal Medicine

## 2014-03-11 ENCOUNTER — Other Ambulatory Visit: Payer: Self-pay | Admitting: Internal Medicine

## 2014-03-11 ENCOUNTER — Ambulatory Visit (INDEPENDENT_AMBULATORY_CARE_PROVIDER_SITE_OTHER): Payer: 59 | Admitting: Internal Medicine

## 2014-03-11 VITALS — BP 136/84 | HR 80 | Wt 164.0 lb

## 2014-03-11 DIAGNOSIS — E039 Hypothyroidism, unspecified: Secondary | ICD-10-CM

## 2014-03-11 DIAGNOSIS — E785 Hyperlipidemia, unspecified: Secondary | ICD-10-CM

## 2014-03-11 DIAGNOSIS — Z8659 Personal history of other mental and behavioral disorders: Secondary | ICD-10-CM

## 2014-03-11 DIAGNOSIS — I10 Essential (primary) hypertension: Secondary | ICD-10-CM

## 2014-03-11 DIAGNOSIS — Z8679 Personal history of other diseases of the circulatory system: Secondary | ICD-10-CM

## 2014-03-11 MED ORDER — QUETIAPINE FUMARATE 50 MG PO TABS: 50.0000 mg | ORAL_TABLET | Freq: Every day | ORAL | Status: DC

## 2014-03-11 NOTE — Patient Instructions (Addendum)
Take Seroquel for sleep. RTC in December for 6 month recheck. Continue to see Dr. Toy Care.

## 2014-03-29 ENCOUNTER — Encounter: Payer: Self-pay | Admitting: Internal Medicine

## 2014-03-29 NOTE — Progress Notes (Signed)
   Subjective:    Patient ID: [name], male    DOB: May 06, 1951, 63 y.o.   MRN: [id number]  HPI In today for six-month followup. He has a history of hypothyroidism, hyperlipidemia, coronary artery disease, hypertension. He is intolerant of statins. Total cholesterol in December was 221 and is now up to 11. LDL cholesterol was 129 in December and is now 120. His TSH was rechecked in March as it was elevated slightly over 9 in December. It was normal in March and this was not repeated today. No new complaints or problems. Sometimes is depressed. Denies being suicidal. History of carotid disease not amenable to surgery. Denies chest pain or shortness of breath. No swelling of the legs. History of left thalamic stroke 2005.    Review of Systems     Objective:   Physical Exam Neck is supple without JVD or thyromegaly. Chest clear to auscultation. Cardiac exam regular rate and rhythm normal S1 and S2. Extremities without edema. Affect is slightly flat but normal for patient. Skin is warm and dry.       Assessment & Plan:  Hypertension-stable on current regimen  History of coronary artery disease-no chest pain  Hyperlipidemia-intolerant of statins but actually improved since December 2014  Hypothyroidism-TSH checked in March and was within normal limits. Was not checked during this visit  History depression-stable and not worse  History of thalamic stroke 2005. History of internal carotid artery disease not amenable to surgery.  Plan: Return in December for physical examination.  25 minutes spent with patient

## 2014-04-07 ENCOUNTER — Other Ambulatory Visit: Payer: Self-pay | Admitting: Cardiovascular Disease

## 2014-04-08 NOTE — Telephone Encounter (Signed)
Rx was sent to pharmacy electronically. 

## 2014-05-07 ENCOUNTER — Other Ambulatory Visit: Payer: Self-pay

## 2014-05-07 MED ORDER — EZETIMIBE 10 MG PO TABS: 10.0000 mg | ORAL_TABLET | Freq: Every day | ORAL | Status: DC

## 2014-05-17 ENCOUNTER — Telehealth: Payer: Self-pay | Admitting: Internal Medicine

## 2014-05-17 MED ORDER — LISINOPRIL 10 MG PO TABS: 10.0000 mg | ORAL_TABLET | Freq: Every day | ORAL | Status: DC

## 2014-05-17 NOTE — Telephone Encounter (Signed)
Have electronically prescribed lisinopril 10 mg #90 with 3 refills to [facility] today

## 2014-06-30 ENCOUNTER — Encounter (HOSPITAL_COMMUNITY): Payer: 59

## 2014-07-13 ENCOUNTER — Other Ambulatory Visit: Payer: Self-pay | Admitting: Cardiovascular Disease

## 2014-07-13 MED ORDER — METOPROLOL SUCCINATE ER 25 MG PO TB24: 25.0000 mg | ORAL_TABLET | Freq: Every day | ORAL | Status: DC

## 2014-07-20 ENCOUNTER — Ambulatory Visit (HOSPITAL_COMMUNITY)
Admission: RE | Admit: 2014-07-20 | Discharge: 2014-07-20 | Disposition: A | Discharge status: Home / Self Care | Payer: 59 | Source: Ambulatory Visit | Attending: Cardiovascular Disease | Admitting: Cardiovascular Disease

## 2014-07-20 DIAGNOSIS — I6523 Occlusion and stenosis of bilateral carotid arteries: Secondary | ICD-10-CM

## 2014-07-20 DIAGNOSIS — I672 Cerebral atherosclerosis: Secondary | ICD-10-CM | POA: Insufficient documentation

## 2014-07-20 DIAGNOSIS — I6529 Occlusion and stenosis of unspecified carotid artery: Secondary | ICD-10-CM

## 2014-07-20 NOTE — Progress Notes (Signed)
Carotid Duplex Completed. °Brianna L Mazza,RVT °

## 2014-08-31 ENCOUNTER — Ambulatory Visit (INDEPENDENT_AMBULATORY_CARE_PROVIDER_SITE_OTHER): Payer: 59 | Admitting: Cardiovascular Disease

## 2014-08-31 ENCOUNTER — Encounter: Payer: Self-pay | Admitting: Cardiovascular Disease

## 2014-08-31 VITALS — BP 138/76 | HR 76 | Ht 67.0 in | Wt 169.3 lb

## 2014-08-31 DIAGNOSIS — I2583 Coronary atherosclerosis due to lipid rich plaque: Principal | ICD-10-CM

## 2014-08-31 DIAGNOSIS — I2510 Atherosclerotic heart disease of native coronary artery without angina pectoris: Secondary | ICD-10-CM

## 2014-08-31 DIAGNOSIS — I10 Essential (primary) hypertension: Secondary | ICD-10-CM

## 2014-08-31 DIAGNOSIS — I6523 Occlusion and stenosis of bilateral carotid arteries: Secondary | ICD-10-CM

## 2014-08-31 DIAGNOSIS — E785 Hyperlipidemia, unspecified: Secondary | ICD-10-CM

## 2014-08-31 NOTE — Assessment & Plan Note (Signed)
History of CAD status post coronary artery bypass grafting with 5 grafts placed by Dr. Merilynn Finland 06/16/08. He had a free LIMA to the LAD, sequential vein to the first diagonal branch, first and second obtuse marginal branches as well as the vein to the PDA. He apparently had high-grade proximal left internal mammary artery stenosis probably related to radiation. He denies chest pain or shortness of breath. His last Myoview performed 12/11/11 was nonischemic.

## 2014-08-31 NOTE — Assessment & Plan Note (Signed)
History of hypertension with blood pressure measured today at 138/76. He is on amlodipine 10 mg a day in addition to lisinopril 10 mg a day and metoprolol XL 25 mg a day. Continue current medications at current dosing

## 2014-08-31 NOTE — Assessment & Plan Note (Signed)
History of moderate bilateral internal carotid artery stenosis by duplex ultrasound most recently measured 07/20/14. I did angiogram him 05/05/08 because of the question of a high-grade left internal carotid artery stenosis read by a radiologist however this turned out to be a left external carotid artery stenosis. He is on aspirin and was neurologically asymptomatic.

## 2014-08-31 NOTE — Assessment & Plan Note (Signed)
History of hyperlipidemia, statin intolerant, on Zetia. His most recent lipid profile performed 03/09/14 revealed total cholesterol 211, LDL of 120 and HDL of 62. Continue current medications

## 2014-08-31 NOTE — Progress Notes (Signed)
08/31/2014 [name]   02-18-1951  [id number]  Primary Physician [name], MD Primary Cardiologist: [name] MD Renae Gloss   HPI:  The patient is a very pleasant 63 year old, thin-appearing, married Caucasian male, father of 73, grandfather to 4 grandchildren who I last saw 15 months ago. He has a history of coronary artery disease status post bypass grafting x5 by Dr. Merilynn Finland June 16, 2008, with a free LIMA to his LAD, sequential vein to the first diagonal branch, first and second obtuse marginal branches, as well as a vein to the PDA. His other problems include hypertension and hyperlipidemia. He denies chest pain or shortness of breath. I did perform cerebral angiography on him May 05, 2008, because of a question of a high-grade internal carotid artery stenosis read by the radiologist. However, this turned out to be a left external carotid artery stenosis. He does have high-grade ostial left internal mammary artery stenosis which necessitated the use of a free LIMA. He is statin intolerant. 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 A Myoview stress test performed December 11, 2011, was normal and carotid Dopplers did show a moderately severe right ICA stenosis which we have been following by duplex ultrasound. 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 Since I saw him one year ago he remains asymptomatic. He denies chest pain or shortness of breath.   Current Outpatient Prescriptions  Medication Sig Dispense Refill  . amLODipine (NORVASC) 10 MG tablet TAKE 1 TABLET BY MOUTH ONCE DAILY 30 tablet 11  . amphetamine-dextroamphetamine (ADDERALL) 30 MG tablet Take 30 mg by mouth 3 (three) times daily.      Marland Kitchen aspirin 81 MG tablet  Take 81 mg by mouth daily.    Marland Kitchen ezetimibe (ZETIA) 10 MG tablet Take 1 tablet (10 mg total) by mouth daily. 90 tablet 3  . Krill Oil 300 MG CAPS Take 300 mg by mouth daily.    Marland Kitchen levothyroxine (SYNTHROID, LEVOTHROID) 75 MCG tablet Take 1 tablet (75 mcg total) by mouth daily. 90 tablet 3  . lisinopril (PRINIVIL,ZESTRIL) 10 MG tablet Take 1 tablet (10 mg total) by mouth daily. 90 tablet 3  . LORazepam (ATIVAN) 1 MG tablet Take 1 mg by mouth every 8 (eight) hours as needed for anxiety.     . metoprolol succinate (TOPROL-XL) 25 MG 24 hr tablet Take 1 tablet (25 mg total) by mouth daily. 90 tablet 0  . niacin 500 MG tablet Take 500 mg by mouth daily with breakfast.      . omeprazole (PRILOSEC) 20 MG capsule Take 20 mg by mouth daily.      Marland Kitchen oxyCODONE-acetaminophen (PERCOCET) 7.5-325 MG per tablet Take 1-2 tablets by mouth every 4 (four) hours as needed for pain. 60 tablet 0  . tadalafil (CIALIS) 20 MG tablet Take 20 mg by mouth daily as needed for erectile dysfunction.    . Vortioxetine HBr (BRINTELLIX) 20 MG TABS Take 15 mg by mouth daily.      No current facility-administered medications for this visit.    Allergies  Allergen Reactions  . Crestor [Rosuvastatin Calcium] Other (See Comments)    Difficulty swallowing  . Lipitor [Atorvastatin Calcium] Other (See Comments)    Muscle spasms  . Tricor [Fenofibrate]  History   Social History  . Marital Status: Married    Spouse Name: N/A    Number of Children: N/A  . Years of Education: N/A   Occupational History  . Not on file.   Social History Main Topics  . Smoking status: Former Smoker -- 3.00 packs/day for 30 years    Types: Cigarettes    Quit date: 01/29/2001  . Smokeless tobacco: Not on file  . Alcohol Use: 4.2 oz/week    7 Cans of beer per week     Comment: daily  . Drug Use: No  . Sexual Activity: Not on file   Other Topics Concern  . Not on file   Social History Narrative     Review of Systems: General: negative  for chills, fever, night sweats or weight changes.  Cardiovascular: negative for chest pain, dyspnea on exertion, edema, orthopnea, palpitations, paroxysmal nocturnal dyspnea or shortness of breath Dermatological: negative for rash Respiratory: negative for cough or wheezing Urologic: negative for hematuria Abdominal: negative for nausea, vomiting, diarrhea, bright red blood per rectum, melena, or hematemesis Neurologic: negative for visual changes, syncope, or dizziness All other systems reviewed and are otherwise negative except as noted above.    Blood pressure 138/76, pulse 76, height 5\' 7"  (1.702 m), weight 169 lb 4.8 oz (76.794 kg).  General appearance: alert and no distress Neck: no adenopathy, no JVD, supple, symmetrical, trachea midline, thyroid not enlarged, symmetric, no tenderness/mass/nodules and bilateral carotid bruits Lungs: clear to auscultation bilaterally Heart: regular rate and rhythm, S1, S2 normal, no murmur, click, rub or gallop Extremities: extremities normal, atraumatic, no cyanosis or edema  EKG not performed today  ASSESSMENT AND PLAN:   Coronary artery disease History of CAD status post coronary artery bypass grafting with 5 grafts placed by Dr. Merilynn Finland 06/16/08. He had a free LIMA to the LAD, sequential vein to the first diagonal branch, first and second obtuse marginal branches as well as the vein to the PDA. He apparently had high-grade proximal left internal mammary artery stenosis probably related to radiation. He denies chest pain or shortness of breath. His last Myoview performed 12/11/11 was nonischemic.  Hypertension History of hypertension with blood pressure measured today at 138/76. He is on amlodipine 10 mg a day in addition to lisinopril 10 mg a day and metoprolol XL 25 mg a day. Continue current medications at current dosing  Internal carotid artery stenosis History of moderate bilateral internal carotid artery stenosis by duplex  ultrasound most recently measured 07/20/14. I did angiogram him 05/05/08 because of the question of a high-grade left internal carotid artery stenosis read by a radiologist however this turned out to be a left external carotid artery stenosis. He is on aspirin and was neurologically asymptomatic.  Hyperlipidemia History of hyperlipidemia, statin intolerant, on Zetia. His most recent lipid profile performed 03/09/14 revealed total cholesterol 211, LDL of 120 and HDL of 62. Continue current medications      [name] MD [facility], [facility] 08/31/2014 10:33 AM

## 2014-08-31 NOTE — Patient Instructions (Signed)
Your physician wants you to follow-up in 1 year with Dr. Berry. You will receive a reminder letter in the mail 2 months in advance. If you do not receive a letter, please call our office to schedule the follow-up appointment.  

## 2014-09-07 ENCOUNTER — Other Ambulatory Visit: Payer: 59 | Admitting: Internal Medicine

## 2014-09-07 DIAGNOSIS — E785 Hyperlipidemia, unspecified: Secondary | ICD-10-CM

## 2014-09-07 DIAGNOSIS — Z130 Encounter for screening for diseases of the blood and blood-forming organs and certain disorders involving the immune mechanism: Secondary | ICD-10-CM

## 2014-09-07 DIAGNOSIS — I10 Essential (primary) hypertension: Secondary | ICD-10-CM

## 2014-09-07 DIAGNOSIS — Z0000 Encounter for general adult medical examination without abnormal findings: Secondary | ICD-10-CM

## 2014-09-07 DIAGNOSIS — E039 Hypothyroidism, unspecified: Secondary | ICD-10-CM

## 2014-09-07 DIAGNOSIS — N529 Male erectile dysfunction, unspecified: Secondary | ICD-10-CM

## 2014-09-07 LAB — CBC WITH DIFFERENTIAL/PLATELET
BASOS ABS: 0.1 10*3/uL (ref 0.0–0.1)
BASOS PCT: 2 % — AB (ref 0–1)
EOS ABS: 0.2 10*3/uL (ref 0.0–0.7)
EOS PCT: 7 % — AB (ref 0–5)
HCT: 42.9 % (ref 39.0–52.0)
Hemoglobin: 14.3 g/dL (ref 13.0–17.0)
Lymphocytes Relative: 25 % (ref 12–46)
Lymphs Abs: 0.9 10*3/uL (ref 0.7–4.0)
MCH: 28.3 pg (ref 26.0–34.0)
MCHC: 33.3 g/dL (ref 30.0–36.0)
MCV: 85 fL (ref 78.0–100.0)
MPV: 8.9 fL — AB (ref 9.4–12.4)
Monocytes Absolute: 0.5 10*3/uL (ref 0.1–1.0)
Monocytes Relative: 15 % — ABNORMAL HIGH (ref 3–12)
NEUTROS PCT: 51 % (ref 43–77)
Neutro Abs: 1.8 10*3/uL (ref 1.7–7.7)
Platelets: 176 10*3/uL (ref 150–400)
RBC: 5.05 MIL/uL (ref 4.22–5.81)
RDW: 15 % (ref 11.5–15.5)
WBC: 3.5 10*3/uL — ABNORMAL LOW (ref 4.0–10.5)

## 2014-09-07 LAB — COMPREHENSIVE METABOLIC PANEL
ALK PHOS: 62 U/L (ref 39–117)
ALT: 49 U/L (ref 0–53)
AST: 29 U/L (ref 0–37)
Albumin: 4.1 g/dL (ref 3.5–5.2)
BUN: 11 mg/dL (ref 6–23)
CO2: 28 mEq/L (ref 19–32)
CREATININE: 0.94 mg/dL (ref 0.50–1.35)
Calcium: 8.9 mg/dL (ref 8.4–10.5)
Chloride: 105 mEq/L (ref 96–112)
Glucose, Bld: 83 mg/dL (ref 70–99)
Potassium: 4.3 mEq/L (ref 3.5–5.3)
Sodium: 142 mEq/L (ref 135–145)
Total Bilirubin: 0.5 mg/dL (ref 0.2–1.2)
Total Protein: 6.3 g/dL (ref 6.0–8.3)

## 2014-09-07 LAB — LIPID PANEL
CHOL/HDL RATIO: 3.6 ratio
CHOLESTEROL: 229 mg/dL — AB (ref 0–200)
HDL: 64 mg/dL (ref >39)
LDL Cholesterol: 111 mg/dL — ABNORMAL HIGH (ref 0–99)
Triglycerides: 270 mg/dL — ABNORMAL HIGH (ref <150)
VLDL: 54 mg/dL — ABNORMAL HIGH (ref 0–40)

## 2014-09-07 LAB — TSH: TSH: 2.026 u[IU]/mL (ref 0.350–4.500)

## 2014-09-08 LAB — PSA: PSA: 2.53 ng/mL (ref <=4.00)

## 2014-09-09 ENCOUNTER — Ambulatory Visit (INDEPENDENT_AMBULATORY_CARE_PROVIDER_SITE_OTHER): Payer: 59 | Admitting: Internal Medicine

## 2014-09-09 ENCOUNTER — Encounter: Payer: Self-pay | Admitting: Internal Medicine

## 2014-09-09 VITALS — BP 130/70 | HR 77 | Temp 97.2°F | Resp 12 | Ht 67.0 in | Wt 168.0 lb

## 2014-09-09 DIAGNOSIS — Z951 Presence of aortocoronary bypass graft: Secondary | ICD-10-CM | POA: Diagnosis not present

## 2014-09-09 DIAGNOSIS — Z8669 Personal history of other diseases of the nervous system and sense organs: Secondary | ICD-10-CM

## 2014-09-09 DIAGNOSIS — M12819 Other specific arthropathies, not elsewhere classified, unspecified shoulder: Secondary | ICD-10-CM

## 2014-09-09 DIAGNOSIS — I10 Essential (primary) hypertension: Secondary | ICD-10-CM

## 2014-09-09 DIAGNOSIS — J309 Allergic rhinitis, unspecified: Secondary | ICD-10-CM

## 2014-09-09 DIAGNOSIS — G902 Horner's syndrome: Secondary | ICD-10-CM

## 2014-09-09 DIAGNOSIS — Z23 Encounter for immunization: Secondary | ICD-10-CM | POA: Diagnosis not present

## 2014-09-09 DIAGNOSIS — C029 Malignant neoplasm of tongue, unspecified: Secondary | ICD-10-CM

## 2014-09-09 DIAGNOSIS — Z87891 Personal history of nicotine dependence: Secondary | ICD-10-CM

## 2014-09-09 DIAGNOSIS — E039 Hypothyroidism, unspecified: Secondary | ICD-10-CM

## 2014-09-09 DIAGNOSIS — M5090 Cervical disc disorder, unspecified, unspecified cervical region: Secondary | ICD-10-CM

## 2014-09-09 DIAGNOSIS — Z889 Allergy status to unspecified drugs, medicaments and biological substances status: Secondary | ICD-10-CM | POA: Diagnosis not present

## 2014-09-09 DIAGNOSIS — Z8673 Personal history of transient ischemic attack (TIA), and cerebral infarction without residual deficits: Secondary | ICD-10-CM

## 2014-09-09 DIAGNOSIS — E785 Hyperlipidemia, unspecified: Secondary | ICD-10-CM

## 2014-09-09 DIAGNOSIS — I6521 Occlusion and stenosis of right carotid artery: Secondary | ICD-10-CM

## 2014-09-09 DIAGNOSIS — Z8659 Personal history of other mental and behavioral disorders: Secondary | ICD-10-CM

## 2014-09-09 DIAGNOSIS — Z0000 Encounter for general adult medical examination without abnormal findings: Secondary | ICD-10-CM | POA: Diagnosis not present

## 2014-09-09 DIAGNOSIS — K219 Gastro-esophageal reflux disease without esophagitis: Secondary | ICD-10-CM

## 2014-09-09 DIAGNOSIS — M19012 Primary osteoarthritis, left shoulder: Secondary | ICD-10-CM

## 2014-09-09 DIAGNOSIS — Z720 Tobacco use: Secondary | ICD-10-CM

## 2014-09-09 DIAGNOSIS — Z789 Other specified health status: Secondary | ICD-10-CM

## 2014-09-09 LAB — POCT URINALYSIS DIPSTICK
BILIRUBIN UA: NEGATIVE
Glucose, UA: NEGATIVE
Ketones, UA: NEGATIVE
Leukocytes, UA: NEGATIVE
NITRITE UA: NEGATIVE
PH UA: 6
Protein, UA: NEGATIVE
RBC UA: NEGATIVE
Spec Grav, UA: 1.015
Urobilinogen, UA: NEGATIVE

## 2014-09-09 MED ORDER — IBUPROFEN 600 MG PO TABS: 600.0000 mg | ORAL_TABLET | Freq: Three times a day (TID) | ORAL | Status: DC | PRN

## 2014-09-09 MED ORDER — AMLODIPINE BESYLATE 10 MG PO TABS: 10.0000 mg | ORAL_TABLET | Freq: Every day | ORAL | Status: DC

## 2014-09-09 MED ORDER — TADALAFIL 20 MG PO TABS: 20.0000 mg | ORAL_TABLET | Freq: Every day | ORAL | Status: DC | PRN

## 2014-09-09 MED ORDER — LISINOPRIL 10 MG PO TABS
10.0000 mg | ORAL_TABLET | Freq: Every day | ORAL | Status: DC
Start: 2014-09-09 — End: 2015-03-02

## 2014-09-09 MED ORDER — LEVOTHYROXINE SODIUM 75 MCG PO TABS: 75.0000 ug | ORAL_TABLET | Freq: Every day | ORAL | Status: DC

## 2014-10-11 ENCOUNTER — Other Ambulatory Visit: Payer: Self-pay | Admitting: Cardiovascular Disease

## 2014-10-11 MED ORDER — METOPROLOL SUCCINATE ER 25 MG PO TB24: 25.0000 mg | ORAL_TABLET | Freq: Every day | ORAL | Status: DC

## 2014-12-14 ENCOUNTER — Emergency Department (HOSPITAL_BASED_OUTPATIENT_CLINIC_OR_DEPARTMENT_OTHER): Payer: 59

## 2014-12-14 ENCOUNTER — Encounter (HOSPITAL_BASED_OUTPATIENT_CLINIC_OR_DEPARTMENT_OTHER): Payer: Self-pay | Admitting: *Deleted

## 2014-12-14 ENCOUNTER — Emergency Department (HOSPITAL_BASED_OUTPATIENT_CLINIC_OR_DEPARTMENT_OTHER)
Admission: EM | Admit: 2014-12-14 | Discharge: 2014-12-14 | Disposition: A | Discharge status: Home / Self Care | Payer: 59 | Attending: Emergency Medicine | Admitting: Emergency Medicine

## 2014-12-14 DIAGNOSIS — Z85810 Personal history of malignant neoplasm of tongue: Secondary | ICD-10-CM | POA: Insufficient documentation

## 2014-12-14 DIAGNOSIS — Y9389 Activity, other specified: Secondary | ICD-10-CM | POA: Insufficient documentation

## 2014-12-14 DIAGNOSIS — T189XXA Foreign body of alimentary tract, part unspecified, initial encounter: Secondary | ICD-10-CM | POA: Diagnosis not present

## 2014-12-14 DIAGNOSIS — Z87891 Personal history of nicotine dependence: Secondary | ICD-10-CM | POA: Insufficient documentation

## 2014-12-14 DIAGNOSIS — J449 Chronic obstructive pulmonary disease, unspecified: Secondary | ICD-10-CM | POA: Insufficient documentation

## 2014-12-14 DIAGNOSIS — E785 Hyperlipidemia, unspecified: Secondary | ICD-10-CM | POA: Diagnosis not present

## 2014-12-14 DIAGNOSIS — I2510 Atherosclerotic heart disease of native coronary artery without angina pectoris: Secondary | ICD-10-CM | POA: Diagnosis not present

## 2014-12-14 DIAGNOSIS — Z8669 Personal history of other diseases of the nervous system and sense organs: Secondary | ICD-10-CM | POA: Diagnosis not present

## 2014-12-14 DIAGNOSIS — Y998 Other external cause status: Secondary | ICD-10-CM | POA: Insufficient documentation

## 2014-12-14 DIAGNOSIS — Z951 Presence of aortocoronary bypass graft: Secondary | ICD-10-CM | POA: Diagnosis not present

## 2014-12-14 DIAGNOSIS — F329 Major depressive disorder, single episode, unspecified: Secondary | ICD-10-CM | POA: Insufficient documentation

## 2014-12-14 DIAGNOSIS — X58XXXA Exposure to other specified factors, initial encounter: Secondary | ICD-10-CM | POA: Diagnosis not present

## 2014-12-14 DIAGNOSIS — Y9289 Other specified places as the place of occurrence of the external cause: Secondary | ICD-10-CM | POA: Diagnosis not present

## 2014-12-14 DIAGNOSIS — Z9889 Other specified postprocedural states: Secondary | ICD-10-CM | POA: Insufficient documentation

## 2014-12-14 DIAGNOSIS — E039 Hypothyroidism, unspecified: Secondary | ICD-10-CM | POA: Insufficient documentation

## 2014-12-14 DIAGNOSIS — I10 Essential (primary) hypertension: Secondary | ICD-10-CM | POA: Insufficient documentation

## 2014-12-14 DIAGNOSIS — Z79899 Other long term (current) drug therapy: Secondary | ICD-10-CM | POA: Insufficient documentation

## 2014-12-14 DIAGNOSIS — Z7982 Long term (current) use of aspirin: Secondary | ICD-10-CM | POA: Insufficient documentation

## 2014-12-14 DIAGNOSIS — K088 Other specified disorders of teeth and supporting structures: Secondary | ICD-10-CM | POA: Diagnosis present

## 2014-12-14 MED ORDER — OXYCODONE-ACETAMINOPHEN 5-325 MG PO TABS: 1.0000 | ORAL_TABLET | Freq: Four times a day (QID) | ORAL | Status: DC | PRN

## 2014-12-14 MED ORDER — IBUPROFEN 800 MG PO TABS: 800.0000 mg | ORAL_TABLET | Freq: Three times a day (TID) | ORAL | Status: DC

## 2014-12-14 NOTE — ED Notes (Signed)
His dentist recommended he have an xray after swallowing a post during dental procedure.

## 2014-12-14 NOTE — Discharge Instructions (Signed)
Please read and follow all provided instructions.  Your diagnoses today include:  1. Swallowed foreign body, initial encounter     Tests performed today include:  Vital signs. See below for your results today.   Chest x-ray - shows dental post in the stomach, it does not appear to be in the lungs  Medications prescribed:   None  Home care instructions:  Follow any educational materials contained in this packet.  Follow-up instructions: Please follow-up with your primary care provider as needed for further evaluation of your symptoms.  Return instructions:   Please return to the Emergency Department if you experience worsening symptoms.   Return with abdominal pain, blood in stools  Please return if you have any other emergent concerns.  Additional Information:  Your vital signs today were: BP 166/90 mmHg   Pulse 97   Temp(Src) 97 F (36.1 C) (Oral)   Resp 20   Ht 5\' 7"  (1.702 m)   Wt 160 lb (72.576 kg)   BMI 25.05 kg/m2   SpO2 99% If your blood pressure (BP) was elevated above 135/85 this visit, please have this repeated by your doctor within one month. ---------------

## 2014-12-14 NOTE — ED Provider Notes (Signed)
CSN: [id number]     Arrival date & time 12/14/14  1725 History   First MD Initiated Contact with Patient 12/14/14 1732     Chief Complaint  Patient presents with  . Dental Problem     (Consider location/radiation/quality/duration/timing/severity/associated sxs/prior Treatment) HPI Comments: Patient presents after swallowing a dental implant post during a dental procedure today. Ingestion was accidental. Patient has not had any symptoms from this including cough, shortness of breath, abdominal pain. Sent from dentist to ensure that he did not aspirate the post. Onset acute. Course is constant. Patient currently asymptomatic.  The history is provided by the patient.    Past Medical History  Diagnosis Date  . CAD (coronary artery disease)   . Squamous cell cancer of tongue   . HTN (hypertension), benign 04/16/2008    echo - EF >55%; no regional wall or valvular abnormalities  . Depression, major   . COPD (chronic obstructive pulmonary disease) 12/11/2011    r/s mv - EF 72%; exercise capacity 13 METS; no exercised induced ischemic EKG changes  . Subclavian steal syndrome 06/17/2012    carotid doppler - R systolic brachial pressure 110mmHg, L 186mmHg; R subclavian artery - proxmial obsstruction w/ abnormal monophasic waveforms, R ECA known occlusive disease; L ECA narrowing w/ 70-99% diameter reductiona  . Hyperlipidemia   . Carotid artery stenosis   . Hypothyroidism    Past Surgical History  Procedure Laterality Date  . Spine surgery  2000    c-spine disc/[facility]  . Coronary artery bypass graft  2009    x5; LIMA to LAd, sequential vein to first diagonal branch, first and second obtuse marginal branches; vein to PDA  . Unknown    . Cardiac catheterization  04/26/2008    L circumflex 99% stenosed midportion straddling a marginal branch; RCA total priximally w/ grade 2 L to R collaterals; renal arteries widely patent; LAD 30% segmental proximal stenosis  . Shoulder arthroscopy with  subacromial decompression and open rotator c Left 09/28/2013    Procedure: LEFT SHOULDER ARTHROSCOPY WITH SUBACROMIAL DECOMPRESSION AND MINI OPEN ROTATOR CUFF REPAIR, OPEN DISTAL CLAVICLE RESECTION AND OPEN BICEP TENDODESIS ;  Surgeon: Augustin Schooling, MD;  Location: [place];  Service: Orthopedics;  Laterality: Left;   Family History  Problem Relation Age of Onset  . Cancer Mother     ovarian  . Stroke Father   . Diabetes Father   . Arthritis Father   . Hypertension Brother    History  Substance Use Topics  . Smoking status: Former Smoker -- 3.00 packs/day for 30 years    Types: Cigarettes    Quit date: 01/29/2001  . Smokeless tobacco: Not on file  . Alcohol Use: 4.2 oz/week    7 Cans of beer per week     Comment: daily    Review of Systems  HENT: Negative for drooling.   Respiratory: Negative for cough, choking, shortness of breath and stridor.   Cardiovascular: Negative for chest pain.  Gastrointestinal: Negative for nausea, vomiting, abdominal pain and blood in stool.      Allergies  Crestor; Lipitor; and Tricor  Home Medications   Prior to Admission medications   Medication Sig Start Date End Date Taking? Authorizing Provider  amLODipine (NORVASC) 10 MG tablet Take 1 tablet (10 mg total) by mouth daily. 09/09/14   [name], MD  amphetamine-dextroamphetamine (ADDERALL) 30 MG tablet Take 30 mg by mouth 3 (three) times daily.      Historical Provider, MD  aspirin  81 MG tablet Take 81 mg by mouth daily.    Historical Provider, MD  ezetimibe (ZETIA) 10 MG tablet Take 1 tablet (10 mg total) by mouth daily. 05/07/14   [name], MD  ibuprofen (ADVIL,MOTRIN) 600 MG tablet Take 1 tablet (600 mg total) by mouth every 8 (eight) hours as needed. 09/09/14   [name], MD  Krill Oil 300 MG CAPS Take 300 mg by mouth daily.    Historical Provider, MD  levothyroxine (SYNTHROID, LEVOTHROID) 75 MCG tablet Take 1 tablet (75 mcg total) by mouth daily. 09/09/14   [name], MD   lisinopril (PRINIVIL,ZESTRIL) 10 MG tablet Take 1 tablet (10 mg total) by mouth daily. 09/09/14   [name], MD  LORazepam (ATIVAN) 1 MG tablet Take 1 mg by mouth every 8 (eight) hours as needed for anxiety.     Historical Provider, MD  metoprolol succinate (TOPROL-XL) 25 MG 24 hr tablet Take 1 tablet (25 mg total) by mouth daily. 10/11/14   [name], MD  niacin 500 MG tablet Take 500 mg by mouth daily with breakfast.      Historical Provider, MD  omeprazole (PRILOSEC) 20 MG capsule Take 20 mg by mouth daily.      Historical Provider, MD  tadalafil (CIALIS) 20 MG tablet Take 1 tablet (20 mg total) by mouth daily as needed for erectile dysfunction. 09/09/14   [name], MD  Vortioxetine HBr (BRINTELLIX) 20 MG TABS Take 15 mg by mouth daily.     Historical Provider, MD   BP 166/90 mmHg  Pulse 97  Temp(Src) 97 F (36.1 C) (Oral)  Resp 20  Ht 5\' 7"  (1.702 m)  Wt 160 lb (72.576 kg)  BMI 25.05 kg/m2  SpO2 99%   Physical Exam  Constitutional: He appears well-developed and well-nourished.  HENT:  Head: Normocephalic and atraumatic.  Packing in place in mouth.   Eyes: Conjunctivae are normal.  Neck: Normal range of motion. Neck supple.  Pulmonary/Chest: Effort normal and breath sounds normal. No respiratory distress. He has no wheezes. He has no rales.  Abdominal: Soft. There is no tenderness.  Neurological: He is alert.  Skin: Skin is warm and dry.  Psychiatric: He has a normal mood and affect.  Nursing note and vitals reviewed.   ED Course  Procedures (including critical care time) Labs Review Labs Reviewed - No data to display  Imaging Review Dg Chest 2 View  12/14/2014   CLINICAL DATA:  Swallowed titanium dental implant post today. Denies shortness of breath or chest pain. Sent here by his dentist.  EXAM: CHEST  2 VIEW  COMPARISON:  09/18/2013  FINDINGS: A small metallic density measures 1.9 x 0.2 cm projecting in the region of the gastric fundus and likely  representing the implant post in question.  Status post median sternotomy and CABG. Heart size is normal. Stable elevation of the right hemidiaphragm. Lungs are clear.  IMPRESSION: Swallowed implant post is most likely within the proximal stomach.   Electronically Signed   By: Nolon Nations M.D.   On: 12/14/2014 18:00     EKG Interpretation None       5:46 PM Patient seen and examined. Work-up initiated.   Vital signs reviewed and are as follows: BP 166/90 mmHg  Pulse 97  Temp(Src) 97 F (36.1 C) (Oral)  Resp 20  Ht 5\' 7"  (1.702 m)  Wt 160 lb (72.576 kg)  BMI 25.05 kg/m2  SpO2 99%  6:11 PM  post appears to be in proximal stomach. No evidence of aspiration. Discussed signs and symptoms to return including abdominal pain, blood in stools. Patient verbalizes understanding and agrees with plan.  MDM   Final diagnoses:  Swallowed foreign body, initial encounter   Patient with swallowed foreign body, likely in stomach. No complications at this point.    Carlisle Cater, PA-C 12/14/14 1813  Evelina Bucy, MD 12/14/14 2123

## 2014-12-27 NOTE — Patient Instructions (Signed)
Continue same medications and return in 6 months for office visit, lipid panel, TSH and blood pressure check. Consider orthopedist for left shoulder arthropathy.

## 2014-12-27 NOTE — Progress Notes (Signed)
Subjective:    Patient ID: [name], male    DOB: [birth year], 64 y.o.   MRN: [id number]  HPI 64 year old male in today for health maintenance exam and evaluation of medical issues. He has a history of coronary artery disease, internal carotid artery stenosis, hypertension, hyperlipidemia, anxiety, attention deficit disorder, back pain, cervical disc disease, depression, erectile dysfunction, GE reflux, hypothyroidism ptosis of the left eye, history of squamous cell carcinoma of the tongue. Squamous cell carcinoma the time was treated with radiation therapy in 2002. He was later found to have bilateral carotid bruits and a high-grade right internal carotid stenosis. Because of previous radiation therapy for carcinoma of the tongue, he was not felt to be a good candidate for carotid endarterectomy. He was evaluated for possible stenting and had a cardiac catheterization because a Myoview study showed inferior ischemia. On cardiac cath, he was found to have a total occlusion of the right coronary artery, a tight bifurcation lesion in the circumflex as well as an 80% stenosis of the diagonal branch. He did not have significant left anterior descending artery disease. He did have a tight stenosis in the margin of his left mammary and moderate stenosis in the arch of his right mammary. Therefore it was felt he should have a right carotid stent followed by CABG. However when he went to the peripheral vascular lab, he was only found to have a 30-40% stenosis of the right internal carotid. The right external carotid was occluded. Therefore standing was not done.  CABG was performed September 2009 and he had 5 grafts.    He is statin intolerant. Has tried Lipitor, Administrator, arts. He says they cause muscle spasms with swallowing. Therefore has been asked to control lipids with diet alone.  He had a discectomy of the C-spine around the Year 2000.  Patient says he had viral hepatitis a number of years  ago.  He developed left eye ptosis after cardiac surgery. He says his face feels numb when he turns his head to the left. He wears contact lenses.  Had colonoscopy by Dr. Collene Mares in 2009.  History of depression. History of allergic rhinitis. Had normal spirometry at Dr. Viann Shove office in 2010. History of adult attention deficit disorder. History of BPH, seborrheic dermatitis, erectile dysfunction. He had a left thoracic stroke in June 2005. History of numbness of the left cheek and the C3 distribution  with documented facet disease on the left at C2-C3 causing left foraminal stenosis as well as bifrontal stenosis at C3-C4, C4-C5, C5-C6 as well as C6-C7 which is aggravated by turning his head. He is ambidextrous. History of hypothyroidism. History of migraine headaches. His previous neurologist, Dr. Erling Cruz, who has retired felt that the patient's carotid disease was due to radiation therapy. Has been diagnosed with left Horner syndrome. MRI of the brain in 2009 indicated he had a right vertebral artery occlusion with 50% stenosis of the right proximal internal carotid artery. Had severe narrowing of the right external carotid artery. Last carotid duplex was done in July 2009.  Dr. Collene Mares has seen him for difficulty swallowing and September 2009. Upper endoscopy showed benign squamous mucosa with mild chronic inflammation on biopsy.  Social history: This is his second marriage. Wife works for [employer]. He smoked for 30 years but quit some 20 years ago. 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 Currently he is not working. He  does drink beer. Has one son from first marriage.  Family history: Mother died with ovarian cancer. Father died apparently with heart failure but also had history of stroke around age 59 and diabetes. Maternal grandmother, paternal grandmother with history of cancer. He has 2 brothers.    Review of  Systems  Constitutional: Negative.   Respiratory: Negative.   Cardiovascular: Negative for chest pain, palpitations and leg swelling.  Gastrointestinal: Negative for nausea, vomiting, abdominal pain and diarrhea.  Genitourinary:       History of BPH and erectile dysfunction  Skin: Negative.   Neurological:       See history of present illness  Psychiatric/Behavioral:       History of depression       Objective:   Physical Exam  Constitutional: He is oriented to person, place, and time. He appears well-developed and well-nourished. No distress.  HENT:  Head: Normocephalic and atraumatic.  Right Ear: External ear normal.  Left Ear: External ear normal.  Mouth/Throat: Oropharynx is clear and moist. No oropharyngeal exudate.  Eyes: Conjunctivae and EOM are normal. Pupils are equal, round, and reactive to light. Right eye exhibits no discharge. Left eye exhibits no discharge. No scleral icterus.  Neck: Neck supple. No JVD present. No thyromegaly present.  Cardiovascular: Normal rate, regular rhythm, normal heart sounds and intact distal pulses.   No murmur heard. Pulmonary/Chest: Effort normal and breath sounds normal. He has no wheezes. He has no rales.  Abdominal: Soft. Bowel sounds are normal. He exhibits no distension and no mass. There is no tenderness. There is no rebound and no guarding.  Genitourinary: Prostate normal.  Musculoskeletal: Normal range of motion. He exhibits no edema.  Lymphadenopathy:    He has no cervical adenopathy.  Neurological: He is oriented to person, place, and time. He has normal reflexes. He displays normal reflexes. No cranial nerve deficit. Coordination normal.  Skin: Skin is warm and dry. No rash noted. He is not diaphoretic.  Psychiatric: He has a normal mood and affect. His behavior is normal. Judgment and thought content normal.  Vitals reviewed.         Assessment & Plan:  Hyperlipidemia-controlled with diet. Statin  intolerant  Hypertension-stable on current regimen  History of coronary artery disease status post CABG  Internal carotid artery stenosis not amenable to surgery  History of carcinoma base of tongue status post radiation treatment 2002  History of cervical disc disease  Hypothyroidism  History of left thalamic stroke 2005  History of left Horner's syndrome  History of right vertebral artery occlusion  50% stenosis right proximal internal carotid artery and severe narrowing of right external carotid artery  History of smoking-currently nonsmoker  GE reflux  History of left shoulder arthropathy  Anxiety depression  Plan: Return in 6 months for office visit and lipid panel along with TSH and blood pressure check.

## 2015-01-03 ENCOUNTER — Other Ambulatory Visit (HOSPITAL_COMMUNITY): Payer: Self-pay | Admitting: Cardiovascular Disease

## 2015-01-03 DIAGNOSIS — I6529 Occlusion and stenosis of unspecified carotid artery: Secondary | ICD-10-CM

## 2015-01-17 ENCOUNTER — Ambulatory Visit (HOSPITAL_COMMUNITY)
Admission: RE | Admit: 2015-01-17 | Discharge: 2015-01-17 | Disposition: A | Discharge status: Home / Self Care | Payer: 59 | Source: Ambulatory Visit | Attending: Cardiovascular Disease | Admitting: Cardiovascular Disease

## 2015-01-17 ENCOUNTER — Other Ambulatory Visit: Payer: Self-pay | Admitting: Dermatology

## 2015-01-17 DIAGNOSIS — I6529 Occlusion and stenosis of unspecified carotid artery: Secondary | ICD-10-CM | POA: Insufficient documentation

## 2015-01-17 NOTE — Progress Notes (Signed)
Carotid Duplex Completed. Audreena Sachdeva, BS, RDMS, RVT  

## 2015-03-02 ENCOUNTER — Ambulatory Visit (INDEPENDENT_AMBULATORY_CARE_PROVIDER_SITE_OTHER): Payer: 59 | Admitting: Cardiovascular Disease

## 2015-03-02 ENCOUNTER — Encounter: Payer: Self-pay | Admitting: Cardiovascular Disease

## 2015-03-02 VITALS — BP 156/80 | HR 78 | Ht 67.0 in | Wt 163.0 lb

## 2015-03-02 DIAGNOSIS — I739 Peripheral vascular disease, unspecified: Secondary | ICD-10-CM | POA: Diagnosis not present

## 2015-03-02 DIAGNOSIS — I10 Essential (primary) hypertension: Secondary | ICD-10-CM | POA: Diagnosis not present

## 2015-03-02 DIAGNOSIS — I6523 Occlusion and stenosis of bilateral carotid arteries: Secondary | ICD-10-CM

## 2015-03-02 DIAGNOSIS — I2510 Atherosclerotic heart disease of native coronary artery without angina pectoris: Secondary | ICD-10-CM | POA: Diagnosis not present

## 2015-03-02 DIAGNOSIS — I2583 Coronary atherosclerosis due to lipid rich plaque: Secondary | ICD-10-CM

## 2015-03-02 MED ORDER — LOSARTAN POTASSIUM 50 MG PO TABS: 50.0000 mg | ORAL_TABLET | Freq: Every day | ORAL | Status: DC

## 2015-03-02 NOTE — Assessment & Plan Note (Signed)
History of coronary artery disease status post coronary artery bypass grafting by Dr. Merilynn Finland 06/16/08 with a free LIMA to his LAD, sequential vein to the first diagonal branch, first and second obtuse marginal branches and a vein to the PDA. He did have an ostial left internal mammary artery stenosis which was the reason a free LIMA was used. A Myoview stress test performed 12/11/11 was normal. He denies chest pain or shortness of breath.

## 2015-03-02 NOTE — Progress Notes (Signed)
03/02/2015 [name]   14-Nov-1950  [id number]  Primary Physician [name], MD Primary Cardiologist: [name] MD Renae Gloss   HPI:  The patient is a very pleasant 64 year old, thin-appearing, married Caucasian male, father of 59, grandfather to 4 grandchildren who I last saw 6 months ago. He has a history of coronary artery disease status post bypass grafting x5 by Dr. Merilynn Finland June 16, 2008, with a free LIMA to his LAD, sequential vein to the first diagonal branch, first and second obtuse marginal branches, as well as a vein to the PDA. His other problems include hypertension and hyperlipidemia. He denies chest pain or shortness of breath. I did perform cerebral angiography on him May 05, 2008, because of a question of a high-grade internal carotid artery stenosis read by the radiologist. However, this turned out to be a left external carotid artery stenosis. He does have high-grade ostial left internal mammary artery stenosis which necessitated the use of a free LIMA. He is statin intolerant. 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 A Myoview stress test performed December 11, 2011, was normal and carotid Dopplers did show a moderately severe right ICA stenosis which we have been following by duplex ultrasound. 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 Since I saw him 6 months ago he remains completely stable denying chest pain or shortness of breath.  Current Outpatient Prescriptions  Medication Sig Dispense Refill  . amLODipine (NORVASC) 10 MG tablet Take 1 tablet (10 mg total) by mouth daily. 90 tablet 3  . amphetamine-dextroamphetamine (ADDERALL) 30 MG tablet Take 30 mg by mouth 3 (three) times daily.      Marland Kitchen aspirin 81 MG  tablet Take 81 mg by mouth daily.    Marland Kitchen esomeprazole (NEXIUM) 20 MG capsule Take 20 mg by mouth daily at 12 noon.    . ezetimibe (ZETIA) 10 MG tablet Take 1 tablet (10 mg total) by mouth daily. 90 tablet 3  . ibuprofen (ADVIL,MOTRIN) 800 MG tablet Take 1 tablet (800 mg total) by mouth 3 (three) times daily. 6 tablet 0  . Krill Oil 300 MG CAPS Take 300 mg by mouth daily.    Marland Kitchen levothyroxine (SYNTHROID, LEVOTHROID) 75 MCG tablet Take 1 tablet (75 mcg total) by mouth daily. 90 tablet 3  . lisinopril (PRINIVIL,ZESTRIL) 10 MG tablet Take 1 tablet (10 mg total) by mouth daily. 90 tablet 3  . LORazepam (ATIVAN) 1 MG tablet Take 1 mg by mouth every 8 (eight) hours as needed for anxiety.     . metoprolol succinate (TOPROL-XL) 25 MG 24 hr tablet Take 1 tablet (25 mg total) by mouth daily. 90 tablet 3  . niacin 500 MG tablet Take 500 mg by mouth daily with breakfast.      . tadalafil (CIALIS) 20 MG tablet Take 1 tablet (20 mg total) by mouth daily as needed for erectile dysfunction. 18 tablet 3  . Vortioxetine HBr (BRINTELLIX) 20 MG TABS Take 15 mg by mouth daily.      No current facility-administered medications for this visit.    Allergies  Allergen Reactions  . Crestor [Rosuvastatin Calcium] Other (See Comments)    Difficulty swallowing  . Lipitor [Atorvastatin Calcium] Other (See Comments)    Muscle spasms  .  Tricor [Fenofibrate]     History   Social History  . Marital Status: Married    Spouse Name: N/A  . Number of Children: N/A  . Years of Education: N/A   Occupational History  . Not on file.   Social History Main Topics  . Smoking status: Former Smoker -- 3.00 packs/day for 30 years    Types: Cigarettes    Quit date: 01/29/2001  . Smokeless tobacco: Not on file  . Alcohol Use: 4.2 oz/week    7 Cans of beer per week     Comment: daily  . Drug Use: No  . Sexual Activity: Not on file   Other Topics Concern  . Not on file   Social History Narrative     Review of  Systems: General: negative for chills, fever, night sweats or weight changes.  Cardiovascular: negative for chest pain, dyspnea on exertion, edema, orthopnea, palpitations, paroxysmal nocturnal dyspnea or shortness of breath Dermatological: negative for rash Respiratory: negative for cough or wheezing Urologic: negative for hematuria Abdominal: negative for nausea, vomiting, diarrhea, bright red blood per rectum, melena, or hematemesis Neurologic: negative for visual changes, syncope, or dizziness All other systems reviewed and are otherwise negative except as noted above.    Blood pressure 124/80, pulse 78, height 5\' 7"  (1.702 m), weight 163 lb (73.936 kg).  General appearance: alert and no distress Neck: no adenopathy, no JVD, supple, symmetrical, trachea midline, thyroid not enlarged, symmetric, no tenderness/mass/nodules and bilateral carotid bruits Lungs: clear to auscultation bilaterally Heart: regular rate and rhythm, S1, S2 normal, no murmur, click, rub or gallop Extremities: extremities normal, atraumatic, no cyanosis or edema  EKG normal sinus rhythm at 78 with right bundle branch block and left axis deviation. I personally reviewed this EKG  ASSESSMENT AND PLAN:   Internal carotid artery stenosis History of bilateral internal carotid artery stenosis both by Ultrasound and angiography. Recent Doppler performed 01/17/15 revealed this to be stable. He does have a hemodynamic significant right subclavian artery stenosis with differential upper extremity blood pressures but denies symptoms. Because of his prior neck radiation should his carotid stenosis progressed to the point of needing revascularization he would be a carotid stent.   Hypertension History of hypertension with blood pressure measured today at 124/80 unfortunately in his right arm. He is on amlodipine and lisinopril in addition to metoprolol. Continue current meds at current dosing   Hyperlipidemia History of  hyperlipidemia. He is statin intolerant. His most recent lipid profile performed 09/07/14 revealed total cholesterol 229 and LDL 111 and HDL of 64. He may be a candidate for PCSK9 monoclonal injectable therapy. I'm going to have him talk to [facility] to further evaluate this.   Coronary artery disease History of coronary artery disease status post coronary artery bypass grafting by Dr. Merilynn Finland 06/16/08 with a free LIMA to his LAD, sequential vein to the first diagonal branch, first and second obtuse marginal branches and a vein to the PDA. He did have an ostial left internal mammary artery stenosis which was the reason a free LIMA was used. A Myoview stress test performed 12/11/11 was normal. He denies chest pain or shortness of breath.       [name] MD FACP,FACC,FAHA, [facility] 03/02/2015 11:32 AM

## 2015-03-02 NOTE — Assessment & Plan Note (Signed)
History of bilateral internal carotid artery stenosis both by Ultrasound and angiography. Recent Doppler performed 01/17/15 revealed this to be stable. He does have a hemodynamic significant right subclavian artery stenosis with differential upper extremity blood pressures but denies symptoms. Because of his prior neck radiation should his carotid stenosis progressed to the point of needing revascularization he would be a carotid stent.

## 2015-03-02 NOTE — Assessment & Plan Note (Signed)
History of hyperlipidemia. He is statin intolerant. His most recent lipid profile performed 09/07/14 revealed total cholesterol 229 and LDL 111 and HDL of 64. He may be a candidate for PCSK9 monoclonal injectable therapy. I'm going to have him talk to [facility] to further evaluate this.

## 2015-03-02 NOTE — Assessment & Plan Note (Signed)
History of hypertension with blood pressure measured today at 124/80 unfortunately in his right arm. He is on amlodipine and lisinopril in addition to metoprolol. Continue current meds at current dosing

## 2015-03-02 NOTE — Patient Instructions (Addendum)
Medication Instructions:   Stop the Lisinopril Start Losartan 50mg  daily  Labwork:  n/a  Testing/Procedures:  Carotid and upper extremity dopplers are due in October.  Our office will contact you when it is time to have these done.   Follow-Up:  1 year with Dr Gwenlyn Found  Any Other Special Instructions Will Be Listed Below (If Applicable).  Your physician has requested that you regularly monitor and record your blood pressure readings at home in your left arm. Please use the same machine at the same time of day to check your readings and record them to bring to your follow-up visit.   If the readings are consistently >140/80, call our office for additional medication changes.

## 2015-04-02 ENCOUNTER — Telehealth: Payer: Self-pay | Admitting: Pharmacist Clinician (PhC)/ Clinical Pharmacy Specialist

## 2015-04-02 DIAGNOSIS — E785 Hyperlipidemia, unspecified: Secondary | ICD-10-CM

## 2015-04-02 NOTE — Telephone Encounter (Signed)
LMOM for patient, will mail lipid lab orders, pt needs lipid clinic appt.

## 2015-04-21 ENCOUNTER — Ambulatory Visit: Payer: 59 | Admitting: Pharmacist Clinician (PhC)/ Clinical Pharmacy Specialist

## 2015-04-25 ENCOUNTER — Other Ambulatory Visit: Payer: Self-pay | Admitting: Cardiovascular Disease

## 2015-04-25 LAB — HEPATIC FUNCTION PANEL
ALT: 37 U/L (ref 9–46)
AST: 29 U/L (ref 10–35)
Albumin: 4.3 g/dL (ref 3.6–5.1)
Alkaline Phosphatase: 60 U/L (ref 40–115)
BILIRUBIN DIRECT: 0.1 mg/dL (ref <=0.2)
BILIRUBIN TOTAL: 0.5 mg/dL (ref 0.2–1.2)
Indirect Bilirubin: 0.4 mg/dL (ref 0.2–1.2)
Total Protein: 6.4 g/dL (ref 6.1–8.1)

## 2015-04-25 LAB — LIPID PANEL
CHOL/HDL RATIO: 3.7 ratio (ref <=5.0)
Cholesterol: 239 mg/dL — ABNORMAL HIGH (ref 125–200)
HDL: 65 mg/dL (ref >=40)
LDL Cholesterol: 124 mg/dL (ref <130)
Triglycerides: 251 mg/dL — ABNORMAL HIGH (ref <150)
VLDL: 50 mg/dL — ABNORMAL HIGH (ref <30)

## 2015-04-28 ENCOUNTER — Ambulatory Visit (INDEPENDENT_AMBULATORY_CARE_PROVIDER_SITE_OTHER): Payer: 59 | Admitting: Pharmacist Clinician (PhC)/ Clinical Pharmacy Specialist

## 2015-04-28 VITALS — Ht 67.0 in | Wt 151.8 lb

## 2015-04-28 DIAGNOSIS — E785 Hyperlipidemia, unspecified: Secondary | ICD-10-CM

## 2015-04-28 NOTE — Patient Instructions (Signed)
Continue taking Zetia 10 mg daily  Start Lovaza 1 gm capsules.  Start with 1 capsule twice daily and increase to 2 capsules twice daily as tolerated  We will start the paperwork to get you either Praluent or Repatha

## 2015-04-29 ENCOUNTER — Other Ambulatory Visit: Payer: Self-pay | Admitting: Internal Medicine

## 2015-04-29 ENCOUNTER — Telehealth: Payer: Self-pay | Admitting: Pharmacist Clinician (PhC)/ Clinical Pharmacy Specialist

## 2015-04-29 ENCOUNTER — Encounter: Payer: Self-pay | Admitting: Pharmacist Clinician (PhC)/ Clinical Pharmacy Specialist

## 2015-04-29 MED ORDER — OMEGA-3-ACID ETHYL ESTERS 1 G PO CAPS: 2.0000 g | ORAL_CAPSULE | Freq: Two times a day (BID) | ORAL | Status: DC

## 2015-04-29 NOTE — Assessment & Plan Note (Signed)
Pt with mixed hyperlipidemia and inability to take statin medications.  He currently takes Zetia 10 mg daily and will add lovaza to his regimen.  I have asked that he start with 1 gm twice daily and increase to 2 capsules twice daily as tolerated.  Will start paperwork today to see if we can get Repatha covered on his insurance.

## 2015-04-29 NOTE — Progress Notes (Signed)
04/29/2015 [name] 12-Feb-1951 [id number]   HPI:  [name] is a 64 y.o. male patient of Dr [name] who presents today for a lipid clinic evaluation.    RF:  Carotid artery stenosis, HTN, CAD with CABG x 5 (06/2008)  Meds: zetia 10 mg daily, krill oil 1 qd   Intolerant: atorvastatin 20 mg caused myalgias, Crestor (strength unknown) worsened his dysphagia  (pt had prior radiation to neck area); these were both tried about 5 years ago.  Pt will contact pharmacy to see if he can get more specific dates on this.  Family history: mom had carotid endarterectomy in her late 56's; dad with CVA and CABG in his 53's; MGF died in his 72's from MI; 1 brother with HTN  Diet:  Drinks boost daily, had radiation to neck after diagnosis of squamous cell carcinoma to tongue, years ago and suffers from dysphagia.  Most foods he eats are soft/easy to swallow.  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 Range 04/25/2015 08:19  Cholesterol Latest Ref Range: 125-200 mg/dL 239 (H)  Triglycerides Latest Ref Range: <150 mg/dL 251 (H)  HDL Cholesterol Latest Ref Range: >=40 mg/dL 65  LDL (calc) Latest Ref Range: <130 mg/dL 124  VLDL Latest Ref Range: <30 mg/dL 50 (H)  Total CHOL/HDL Ratio Latest Ref Range: <=5.0 Ratio 3.7     Current Outpatient Prescriptions  Medication Sig Dispense Refill  . amLODipine (NORVASC) 10 MG tablet Take 1 tablet (10 mg total) by mouth daily. 90 tablet 3  . amphetamine-dextroamphetamine (ADDERALL) 30 MG tablet Take 30 mg by mouth 3 (three) times daily.      Marland Kitchen aspirin 81 MG tablet Take 81 mg by mouth daily.    Marland Kitchen esomeprazole (NEXIUM) 20 MG capsule Take 20 mg by mouth daily at 12 noon.    . ezetimibe (ZETIA) 10 MG tablet Take 1 tablet (10 mg total) by mouth daily. 90 tablet 3  . ibuprofen (ADVIL,MOTRIN) 800 MG tablet Take 1 tablet (800 mg  total) by mouth 3 (three) times daily. 6 tablet 0  . Krill Oil 300 MG CAPS Take 300 mg by mouth daily.    Marland Kitchen levothyroxine (SYNTHROID, LEVOTHROID) 75 MCG tablet Take 1 tablet (75 mcg total) by mouth daily. 90 tablet 3  . LORazepam (ATIVAN) 1 MG tablet Take 1 mg by mouth every 8 (eight) hours as needed for anxiety.     Marland Kitchen losartan (COZAAR) 50 MG tablet Take 1 tablet (50 mg total) by mouth daily. 30 tablet 6  . metoprolol succinate (TOPROL-XL) 25 MG 24 hr tablet Take 1 tablet (25 mg total) by mouth daily. 90 tablet 3  . niacin 500 MG tablet Take 500 mg by mouth daily with breakfast.      . tadalafil (CIALIS) 20 MG tablet Take 1 tablet (20 mg total) by mouth daily as needed for erectile dysfunction. 18 tablet 3  . Vortioxetine HBr (BRINTELLIX) 20 MG TABS Take 15 mg by mouth daily.      No current facility-administered medications for this visit.    Allergies  Allergen Reactions  . Crestor [Rosuvastatin Calcium] Other (See Comments)    Difficulty swallowing  . Lipitor [Atorvastatin Calcium] Other (See Comments)    Muscle spasms  . Lisinopril Cough  . Tricor [Fenofibrate]     Past Medical History  Diagnosis  Date  . CAD (coronary artery disease)   . Squamous cell cancer of tongue   . HTN (hypertension), benign 04/16/2008    echo - EF >55%; no regional wall or valvular abnormalities  . Depression, major   . COPD (chronic obstructive pulmonary disease) 12/11/2011    r/s mv - EF 72%; exercise capacity 13 METS; no exercised induced ischemic EKG changes  . Subclavian steal syndrome 06/17/2012    carotid doppler - R systolic brachial pressure 134mmHg, L 15mmHg; R subclavian artery - proxmial obsstruction w/ abnormal monophasic waveforms, R ECA known occlusive disease; L ECA narrowing w/ 70-99% diameter reductiona  . Hyperlipidemia     statin intolerant  . Carotid artery stenosis   . Hypothyroidism     Height 5\' 7"  (1.702 m), weight 151 lb 12.8 oz (68.856 kg).   ASSESSMENT AND  PLAN:  [name] [name]

## 2015-04-29 NOTE — Telephone Encounter (Signed)
Pt in office to discuss lipid management.  As he was leaving, gave a list of home BP readings to share with Dr. Gwenlyn Found.   Of the 15 readings over 2 months the average was 134/76.  Highest was 155/84, and lowest was 110/67.  Assured patient that these numbers look good and he should continue with current regimen.

## 2015-05-06 ENCOUNTER — Other Ambulatory Visit: Payer: Self-pay | Admitting: Pharmacist Clinician (PhC)/ Clinical Pharmacy Specialist

## 2015-05-06 MED ORDER — EVOLOCUMAB 140 MG/ML ~~LOC~~ SOAJ: 140.0000 mg | SUBCUTANEOUS | Status: DC

## 2015-05-12 ENCOUNTER — Telehealth: Payer: Self-pay | Admitting: Cardiovascular Disease

## 2015-05-12 NOTE — Telephone Encounter (Signed)
[name] was calling in to check the status of the PA that was sent in for the pt's Repatha. Please call and f/u with her   Thanks

## 2015-05-12 NOTE — Telephone Encounter (Signed)
Advised will let them know when approved

## 2015-06-07 ENCOUNTER — Telehealth: Payer: Self-pay | Admitting: Cardiovascular Disease

## 2015-06-07 NOTE — Telephone Encounter (Signed)
Tishawn called in stating that they received a prescription for Repatha for the pt but the prior authorization was denied. She wanted to know if there was another drug that the doctor wanted to prescribe for him. Please f/u with her   Thanks

## 2015-06-07 NOTE — Telephone Encounter (Signed)
Spoke with Ginnie Smart, they received a letter of approval for Praluent.  She will fax to our office and I will make arrangements for patient teaching.

## 2015-06-09 ENCOUNTER — Other Ambulatory Visit: Payer: Self-pay | Admitting: Pharmacist Clinician (PhC)/ Clinical Pharmacy Specialist

## 2015-06-09 DIAGNOSIS — E785 Hyperlipidemia, unspecified: Secondary | ICD-10-CM

## 2015-06-09 MED ORDER — ALIROCUMAB 150 MG/ML ~~LOC~~ SOPN: 150.0000 mg | PEN_INJECTOR | SUBCUTANEOUS | Status: DC

## 2015-08-26 ENCOUNTER — Other Ambulatory Visit: Payer: Self-pay | Admitting: Internal Medicine

## 2015-09-05 ENCOUNTER — Other Ambulatory Visit: Payer: Self-pay | Admitting: Cardiovascular Disease

## 2015-09-05 LAB — LIPID PANEL
CHOL/HDL RATIO: 2 ratio (ref <=5.0)
Cholesterol: 135 mg/dL (ref 125–200)
HDL: 69 mg/dL (ref >=40)
LDL CALC: 45 mg/dL (ref <130)
Triglycerides: 107 mg/dL (ref <150)
VLDL: 21 mg/dL (ref <30)

## 2015-09-05 LAB — HEPATIC FUNCTION PANEL
ALT: 30 U/L (ref 9–46)
AST: 21 U/L (ref 10–35)
Albumin: 4.1 g/dL (ref 3.6–5.1)
Alkaline Phosphatase: 52 U/L (ref 40–115)
BILIRUBIN DIRECT: 0.1 mg/dL (ref <=0.2)
BILIRUBIN INDIRECT: 0.3 mg/dL (ref 0.2–1.2)
BILIRUBIN TOTAL: 0.4 mg/dL (ref 0.2–1.2)
Total Protein: 6 g/dL — ABNORMAL LOW (ref 6.1–8.1)

## 2015-09-07 ENCOUNTER — Other Ambulatory Visit: Payer: Self-pay | Admitting: Pharmacist

## 2015-09-30 ENCOUNTER — Other Ambulatory Visit: Payer: Self-pay | Admitting: Cardiovascular Disease

## 2015-09-30 NOTE — Telephone Encounter (Signed)
REFILL 

## 2015-10-06 ENCOUNTER — Other Ambulatory Visit: Payer: Self-pay | Admitting: Internal Medicine

## 2015-10-06 ENCOUNTER — Other Ambulatory Visit: Payer: Self-pay | Admitting: Cardiovascular Disease

## 2015-10-06 MED FILL — OMEGA-3 ETHYL ESTERS 1 GM C: 1 | 30 days supply | Qty: 120 | Fill #3

## 2015-10-06 MED FILL — METOPROLOL SUCC ER 25 MG TA: 25 | 90 days supply | Qty: 90 | Fill #0

## 2015-10-06 MED FILL — CIALIS 20 MG TABLET: 20 | 30 days supply | Qty: 6 | Fill #0

## 2015-10-06 NOTE — Telephone Encounter (Signed)
REFILL 

## 2015-10-13 MED FILL — PRALUENT 150 MG/ML PEN: 150 | 30 days supply | Qty: 2 | Fill #4

## 2015-10-27 ENCOUNTER — Ambulatory Visit (HOSPITAL_COMMUNITY)
Admission: RE | Admit: 2015-10-27 | Discharge: 2015-10-27 | Disposition: A | Discharge status: Home / Self Care | Payer: 59 | Source: Ambulatory Visit | Attending: Cardiovascular Disease | Admitting: Cardiovascular Disease

## 2015-10-27 ENCOUNTER — Other Ambulatory Visit: Payer: Self-pay | Admitting: Cardiovascular Disease

## 2015-10-27 DIAGNOSIS — I739 Peripheral vascular disease, unspecified: Secondary | ICD-10-CM | POA: Insufficient documentation

## 2015-10-27 DIAGNOSIS — R938 Abnormal findings on diagnostic imaging of other specified body structures: Secondary | ICD-10-CM | POA: Insufficient documentation

## 2015-10-27 DIAGNOSIS — I6523 Occlusion and stenosis of bilateral carotid arteries: Secondary | ICD-10-CM | POA: Insufficient documentation

## 2015-10-27 DIAGNOSIS — I10 Essential (primary) hypertension: Secondary | ICD-10-CM | POA: Insufficient documentation

## 2015-10-27 DIAGNOSIS — E785 Hyperlipidemia, unspecified: Secondary | ICD-10-CM | POA: Diagnosis not present

## 2015-10-27 DIAGNOSIS — R0989 Other specified symptoms and signs involving the circulatory and respiratory systems: Secondary | ICD-10-CM

## 2015-10-27 DIAGNOSIS — G458 Other transient cerebral ischemic attacks and related syndromes: Secondary | ICD-10-CM

## 2015-10-27 DIAGNOSIS — I70208 Unspecified atherosclerosis of native arteries of extremities, other extremity: Secondary | ICD-10-CM | POA: Insufficient documentation

## 2015-11-01 ENCOUNTER — Other Ambulatory Visit: Payer: Self-pay

## 2015-11-01 DIAGNOSIS — G458 Other transient cerebral ischemic attacks and related syndromes: Secondary | ICD-10-CM

## 2015-11-01 DIAGNOSIS — I739 Peripheral vascular disease, unspecified: Secondary | ICD-10-CM

## 2015-11-10 MED FILL — PRALUENT 150 MG/ML PEN: 150 | 30 days supply | Qty: 2 | Fill #5

## 2015-11-11 MED FILL — LORazepam 1 MG TABS: 1 | 90 days supply | Qty: 360 | Fill #1

## 2015-11-11 MED FILL — CIALIS 20 MG TABLET: 20 | 30 days supply | Qty: 6 | Fill #1

## 2015-11-11 MED FILL — TRINTELLIX 5 MG TABLET: 5 | 30 days supply | Qty: 90 | Fill #1

## 2015-11-25 MED FILL — DEXTROAMP-AMP 30 MG TABLET: 30 | 90 days supply | Qty: 270 | Fill #0

## 2015-12-02 ENCOUNTER — Other Ambulatory Visit: Payer: Self-pay | Admitting: Internal Medicine

## 2015-12-02 MED FILL — AMLODIPINE BESYLATE 10 MG T: 10 | 90 days supply | Qty: 90 | Fill #0

## 2015-12-02 MED FILL — LEVOTHYROXINE 75 MCG TABLET: 75 | 90 days supply | Qty: 90 | Fill #0

## 2015-12-02 NOTE — Telephone Encounter (Signed)
Past due for CPE. Last Dec 2015. Please contact him and refill until CPE

## 2015-12-02 NOTE — Telephone Encounter (Signed)
Past due CPE

## 2015-12-15 ENCOUNTER — Telehealth: Payer: Self-pay | Admitting: Cardiovascular Disease

## 2015-12-15 NOTE — Telephone Encounter (Signed)
SPOKE TO PATIENT   INFORMED PATIENT , WILL DEFER TO KRISTIN - PHARMACIST ( LIPID CLINIC) SHE WILL CALL HER HIM BACK.

## 2015-12-15 NOTE — Telephone Encounter (Signed)
New Message  Pt calling to speak w/ RN- stated that he has been taking prevalant for 6 mo- but stated that now, his insurance will no longer cover- pt requested to discuss options. Please call back and discuss.

## 2015-12-16 MED FILL — CIALIS 20 MG TABLET: 20 | 30 days supply | Qty: 6 | Fill #2

## 2015-12-19 MED FILL — CLINDAMYCIN HCL 300 MG CAPS: 300 | 8 days supply | Qty: 24 | Fill #0

## 2015-12-20 NOTE — Telephone Encounter (Signed)
Prior auth paperwork faxed back to [employer]

## 2015-12-23 ENCOUNTER — Other Ambulatory Visit: Payer: 59 | Admitting: Internal Medicine

## 2015-12-23 DIAGNOSIS — Z125 Encounter for screening for malignant neoplasm of prostate: Secondary | ICD-10-CM

## 2015-12-23 DIAGNOSIS — I10 Essential (primary) hypertension: Secondary | ICD-10-CM | POA: Diagnosis not present

## 2015-12-23 DIAGNOSIS — Z0000 Encounter for general adult medical examination without abnormal findings: Secondary | ICD-10-CM

## 2015-12-23 DIAGNOSIS — E039 Hypothyroidism, unspecified: Secondary | ICD-10-CM

## 2015-12-23 DIAGNOSIS — E785 Hyperlipidemia, unspecified: Secondary | ICD-10-CM | POA: Diagnosis not present

## 2015-12-23 LAB — CBC WITH DIFFERENTIAL/PLATELET
Basophils Absolute: 0.1 10*3/uL (ref 0.0–0.1)
Basophils Relative: 2 % — ABNORMAL HIGH (ref 0–1)
Eosinophils Absolute: 0.3 10*3/uL (ref 0.0–0.7)
Eosinophils Relative: 8 % — ABNORMAL HIGH (ref 0–5)
HEMATOCRIT: 45.7 % (ref 39.0–52.0)
HEMOGLOBIN: 15.4 g/dL (ref 13.0–17.0)
LYMPHS ABS: 0.6 10*3/uL — AB (ref 0.7–4.0)
LYMPHS PCT: 17 % (ref 12–46)
MCH: 28.8 pg (ref 26.0–34.0)
MCHC: 33.7 g/dL (ref 30.0–36.0)
MCV: 85.4 fL (ref 78.0–100.0)
MONO ABS: 0.4 10*3/uL (ref 0.1–1.0)
MONOS PCT: 11 % (ref 3–12)
MPV: 9.3 fL (ref 8.6–12.4)
NEUTROS ABS: 2.3 10*3/uL (ref 1.7–7.7)
NEUTROS PCT: 62 % (ref 43–77)
Platelets: 163 10*3/uL (ref 150–400)
RBC: 5.35 MIL/uL (ref 4.22–5.81)
RDW: 15.4 % (ref 11.5–15.5)
WBC: 3.7 10*3/uL — ABNORMAL LOW (ref 4.0–10.5)

## 2015-12-23 LAB — TSH: TSH: 2.12 mIU/L (ref 0.40–4.50)

## 2015-12-24 LAB — COMPLETE METABOLIC PANEL WITH GFR
ALBUMIN: 4.2 g/dL (ref 3.6–5.1)
ALK PHOS: 57 U/L (ref 40–115)
ALT: 37 U/L (ref 9–46)
AST: 36 U/L — ABNORMAL HIGH (ref 10–35)
BILIRUBIN TOTAL: 0.4 mg/dL (ref 0.2–1.2)
BUN: 17 mg/dL (ref 7–25)
CALCIUM: 9.4 mg/dL (ref 8.6–10.3)
CO2: 26 mmol/L (ref 20–31)
Chloride: 104 mmol/L (ref 98–110)
Creat: 1.02 mg/dL (ref 0.70–1.25)
GFR, EST AFRICAN AMERICAN: 89 mL/min (ref >=60)
GFR, EST NON AFRICAN AMERICAN: 77 mL/min (ref >=60)
GLUCOSE: 92 mg/dL (ref 65–99)
POTASSIUM: 4.5 mmol/L (ref 3.5–5.3)
SODIUM: 143 mmol/L (ref 135–146)
TOTAL PROTEIN: 6.5 g/dL (ref 6.1–8.1)

## 2015-12-24 LAB — LIPID PANEL
CHOLESTEROL: 162 mg/dL (ref 125–200)
HDL: 82 mg/dL (ref >=40)
LDL Cholesterol: 61 mg/dL (ref <130)
Total CHOL/HDL Ratio: 2 Ratio (ref <=5.0)
Triglycerides: 97 mg/dL (ref <150)
VLDL: 19 mg/dL (ref <30)

## 2015-12-24 LAB — PSA: PSA: 3.73 ng/mL (ref <=4.00)

## 2015-12-26 ENCOUNTER — Encounter: Payer: Self-pay | Admitting: Internal Medicine

## 2015-12-26 ENCOUNTER — Ambulatory Visit (INDEPENDENT_AMBULATORY_CARE_PROVIDER_SITE_OTHER): Payer: 59 | Admitting: Internal Medicine

## 2015-12-26 VITALS — BP 172/80 | HR 76 | Temp 97.0°F | Resp 20 | Ht 67.0 in | Wt 162.0 lb

## 2015-12-26 DIAGNOSIS — G909 Disorder of the autonomic nervous system, unspecified: Secondary | ICD-10-CM

## 2015-12-26 DIAGNOSIS — Z8673 Personal history of transient ischemic attack (TIA), and cerebral infarction without residual deficits: Secondary | ICD-10-CM

## 2015-12-26 DIAGNOSIS — Z789 Other specified health status: Secondary | ICD-10-CM

## 2015-12-26 DIAGNOSIS — I6521 Occlusion and stenosis of right carotid artery: Secondary | ICD-10-CM

## 2015-12-26 DIAGNOSIS — E039 Hypothyroidism, unspecified: Secondary | ICD-10-CM | POA: Diagnosis not present

## 2015-12-26 DIAGNOSIS — D0007 Carcinoma in situ of tongue: Secondary | ICD-10-CM

## 2015-12-26 DIAGNOSIS — Z0000 Encounter for general adult medical examination without abnormal findings: Secondary | ICD-10-CM

## 2015-12-26 DIAGNOSIS — Z8669 Personal history of other diseases of the nervous system and sense organs: Secondary | ICD-10-CM

## 2015-12-26 DIAGNOSIS — M5090 Cervical disc disorder, unspecified, unspecified cervical region: Secondary | ICD-10-CM

## 2015-12-26 DIAGNOSIS — Z8659 Personal history of other mental and behavioral disorders: Secondary | ICD-10-CM | POA: Diagnosis not present

## 2015-12-26 DIAGNOSIS — M19012 Primary osteoarthritis, left shoulder: Secondary | ICD-10-CM

## 2015-12-26 DIAGNOSIS — Z889 Allergy status to unspecified drugs, medicaments and biological substances status: Secondary | ICD-10-CM

## 2015-12-26 DIAGNOSIS — Z951 Presence of aortocoronary bypass graft: Secondary | ICD-10-CM | POA: Diagnosis not present

## 2015-12-26 DIAGNOSIS — K219 Gastro-esophageal reflux disease without esophagitis: Secondary | ICD-10-CM | POA: Diagnosis not present

## 2015-12-26 DIAGNOSIS — I10 Essential (primary) hypertension: Secondary | ICD-10-CM | POA: Diagnosis not present

## 2015-12-26 DIAGNOSIS — Z720 Tobacco use: Secondary | ICD-10-CM

## 2015-12-26 DIAGNOSIS — E785 Hyperlipidemia, unspecified: Secondary | ICD-10-CM | POA: Diagnosis not present

## 2015-12-26 DIAGNOSIS — Z87891 Personal history of nicotine dependence: Secondary | ICD-10-CM

## 2015-12-26 DIAGNOSIS — J309 Allergic rhinitis, unspecified: Secondary | ICD-10-CM

## 2015-12-26 DIAGNOSIS — M12819 Other specific arthropathies, not elsewhere classified, unspecified shoulder: Secondary | ICD-10-CM

## 2015-12-26 DIAGNOSIS — G902 Horner's syndrome: Secondary | ICD-10-CM

## 2015-12-26 LAB — POCT URINALYSIS DIPSTICK
Bilirubin, UA: NEGATIVE
Blood, UA: NEGATIVE
Glucose, UA: NEGATIVE
KETONES UA: NEGATIVE
LEUKOCYTES UA: NEGATIVE
Nitrite, UA: NEGATIVE
PH UA: 6
PROTEIN UA: NEGATIVE
SPEC GRAV UA: 1.025
Urobilinogen, UA: 0.2

## 2015-12-26 MED FILL — LOSARTAN POTASSIUM 50 MG TA: 50 | 90 days supply | Qty: 90 | Fill #1

## 2015-12-26 MED FILL — PRALUENT 150 MG/ML PEN: 150 | 30 days supply | Qty: 2 | Fill #6

## 2015-12-26 NOTE — Progress Notes (Signed)
Subjective:    Patient ID: [name], male    DOB: 01-01-1951, 65 y.o.   MRN: [id number]  HPI 65 year old male in today for health maintenance exam and evaluation of medical problems. Labs reviewed with him are within normal limits with exception of a PSA that has increased from 2.53-3.73. I want to check it again in 6 months for stability. He has history of  BPH but not a lot of symptoms. Urine dipstick is normal. He has a history of low white blood cell count in the 3700 range which has been followed for several years. He is asymptomatic.  Continues to have issues with left rotator cuff and may need to have surgery by Dr. Veverly Fells.  History of depression, hyperlipidemia, hypothyroidism, status post CABG 5, history of smoking, Horner syndrome, history of stroke, cervical disc disease, essential hypertension, statin intolerance, squamous cell carcinoma of the tongue, external carotid artery stenosis on the right, history of migraine headaches, allergic rhinitis. History of ptosis left eye and left Horner syndrome. History of attention deficit disorder, cervical disc disease, back pain.  Squamous cell carcinoma was treated with radiation therapy in 2002. He was later found to have bilateral carotid bruits and a high-grade right internal carotid stenosis. Because of previous radiation therapy for carcinoma of the tongue, he was not felt to be a good candidate for carotid endarterectomy. He was evaluated for possible stenting and had cardiac catheterization because a Myoview study showed inferior ischemia. On cardiac cath he was found to have a total occlusion of the right coronary artery, tight bifurcation lesion in the circumflex as well as an 80% stenosis of the diagonal branch. He did not have significant left anterior descending artery disease. He did have a tight stenosis in the margin of his left memory and moderate stenosis in the arch of his right memory. Therefore was felt he should have a  right carotid stent followed by CABG. However when he went to peripheral vascular lab, he was found to have only a 30-40% stenosis in the right internal carotid. The right external carotid was occluded. Therefore stenting was not done.  CABG was performed September 2009 and he had 5 grafts.  He is tried Lipitor Administrator, arts and cannot take any of these. He says alcohol muscle spasms with swallowing. Therefore he's been asked to control lipids with diet alone.  He had vasectomy of the C-spine around the year 2000. Patient says he had viral hepatitis a number of years ago.  He developed left eye ptosis after cardiac surgery. He says his face feels numb when he turns his head to the left. He wears contact lenses.  Had colonoscopy by Dr. Collene Mares in 2009.  He had normals from a trip Dr. Viann Shove office in 2010.  He had flu vaccine 10/18/2015.  History of elevated blood pressure left arm. It reads 172/80 today. In right arm is 134/80. This is long-standing.  Dr. Collene Mares has seen him for difficulty swallowing in September 2009. Upper endoscopy showed benign squamous mucosa with mild chronic inflammation on biopsy.  He is ambidextrous. His previous neurologist, Dr. love who has retired felt the patient's carotid disease with due to radiation therapy. MRI of the brain in 2009 indicated he had a right vertebral artery occlusion with 50% stenosis of the right proximal internal carotid artery. Had severe narrowing of the right external carotid artery. Last carotid duplex was done in July 2009.  Social history: Wife works for [employer]. 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 He smoked for 30 years but quit over 20 years ago. One son from previous marriage. This is his second marriage. He does drink beer.  Family history: Mother died with ovarian cancer. Father died apparently with heart  failure but also had history of stroke around age 27 and diabetes. Maternal grandmother, paternal grandmother with history of cancer. He has 2 brothers.  History of GE reflux,  Social Hx: working Architect at present time.  Family history reviewed and unchanged  Review of Systems     Objective:   Physical Exam  Constitutional: He is oriented to person, place, and time. He appears well-developed and well-nourished. No distress.  HENT:  Head: Normocephalic and atraumatic.  Right Ear: External ear normal.  Left Ear: External ear normal.  Mouth/Throat: Oropharynx is clear and moist.  Eyes: Conjunctivae and EOM are normal. Pupils are equal, round, and reactive to light. Right eye exhibits no discharge. Left eye exhibits no discharge. No scleral icterus.  Neck: Neck supple. No JVD present. No thyromegaly present.  Cardiovascular: Normal rate, regular rhythm and normal heart sounds.   No murmur heard. Pulmonary/Chest: Breath sounds normal. He has no wheezes. He has no rales.  Abdominal: Bowel sounds are normal. He exhibits no distension and no mass. There is no tenderness. There is no rebound and no guarding.  Genitourinary:  Prostate Slightly enlarged without nodules  Musculoskeletal: He exhibits no edema.  Lymphadenopathy:    He has no cervical adenopathy.  Neurological: He is alert and oriented to person, place, and time. He has normal reflexes. No cranial nerve deficit. Coordination normal.  Skin: Skin is warm and dry. No rash noted. He is not diaphoretic.  Psychiatric: He has a normal mood and affect. His behavior is normal. Judgment and thought content normal.  Vitals reviewed.         Assessment & Plan:  Hyperlipidemia-stable  Hypertension-stable  History of coronary artery disease status post CABG  Internal carotid artery stenosis not amenable to surgery  History of carcinoma base of tongue status post radiation treatment 2002  History of cervical disc  disease  Hypothyroidism  History of left thalamic stroke 2005  History of left Horner syndrome  History of right vertebral artery occlusion  50% stenosis right proximal internal carotid artery and severe Nehring of right external carotid artery  History of smoking-currently nonsmoker  Anxiety depression  History of left shoulder arthropathy  GE reflux  Plan: Return in 6 months. PSA will be repeated along with TSH and lipid panel with blood pressure check. Continue same medications.

## 2015-12-28 DIAGNOSIS — M25511 Pain in right shoulder: Secondary | ICD-10-CM | POA: Diagnosis not present

## 2015-12-29 MED FILL — CLINDAMYCIN HCL 300 MG CAPS: 300 | 8 days supply | Qty: 24 | Fill #0

## 2015-12-29 MED FILL — HYDROCODON-APAP 10-325: 10-325 | 2 days supply | Qty: 12 | Fill #0

## 2015-12-30 NOTE — Patient Instructions (Signed)
It is pleasure to see you today. Return in 6 months for office visit, repeat PSA although it's likely it is elevated due to BPH, recheck TSH, blood pressure check. Continue same medications.

## 2016-01-11 MED FILL — METOPROLOL SUCC ER 25 MG TA: 25 | 90 days supply | Qty: 90 | Fill #1

## 2016-01-18 DIAGNOSIS — L821 Other seborrheic keratosis: Secondary | ICD-10-CM | POA: Diagnosis not present

## 2016-01-18 DIAGNOSIS — Z85828 Personal history of other malignant neoplasm of skin: Secondary | ICD-10-CM | POA: Diagnosis not present

## 2016-01-18 DIAGNOSIS — L570 Actinic keratosis: Secondary | ICD-10-CM | POA: Diagnosis not present

## 2016-01-18 DIAGNOSIS — D225 Melanocytic nevi of trunk: Secondary | ICD-10-CM | POA: Diagnosis not present

## 2016-01-20 MED FILL — CIALIS 20 MG TABLET: 20 | 30 days supply | Qty: 6 | Fill #3

## 2016-02-03 MED FILL — TRINTELLIX 5 MG TABLET: 5 | 30 days supply | Qty: 90 | Fill #2

## 2016-02-03 MED FILL — PRALUENT 150 MG/ML PEN: 150 | 30 days supply | Qty: 2 | Fill #7

## 2016-02-22 MED FILL — AMPHETAMINE SALTS 30 MG TAB: 30 | 90 days supply | Qty: 270 | Fill #0

## 2016-02-22 MED FILL — CIALIS 20 MG TABLET: 20 | 30 days supply | Qty: 6 | Fill #4

## 2016-03-02 ENCOUNTER — Other Ambulatory Visit: Payer: Self-pay | Admitting: Internal Medicine

## 2016-03-02 MED FILL — PRALUENT 150 MG/ML PEN: 150 | 30 days supply | Qty: 2 | Fill #8

## 2016-03-02 MED FILL — AMLODIPINE BESYLATE 10 MG T: 10 | 90 days supply | Qty: 90 | Fill #0

## 2016-03-07 ENCOUNTER — Other Ambulatory Visit: Payer: Self-pay | Admitting: Internal Medicine

## 2016-03-07 MED FILL — LEVOTHYROXINE 75 MCG TABLET: 75 | 90 days supply | Qty: 90 | Fill #0

## 2016-03-14 ENCOUNTER — Ambulatory Visit (INDEPENDENT_AMBULATORY_CARE_PROVIDER_SITE_OTHER): Payer: 59 | Admitting: Cardiovascular Disease

## 2016-03-14 VITALS — BP 175/100 | HR 75 | Ht 67.0 in | Wt 163.2 lb

## 2016-03-14 DIAGNOSIS — R0609 Other forms of dyspnea: Secondary | ICD-10-CM | POA: Diagnosis not present

## 2016-03-14 DIAGNOSIS — I6523 Occlusion and stenosis of bilateral carotid arteries: Secondary | ICD-10-CM | POA: Diagnosis not present

## 2016-03-14 DIAGNOSIS — I10 Essential (primary) hypertension: Secondary | ICD-10-CM

## 2016-03-14 DIAGNOSIS — I739 Peripheral vascular disease, unspecified: Secondary | ICD-10-CM

## 2016-03-14 DIAGNOSIS — E785 Hyperlipidemia, unspecified: Secondary | ICD-10-CM

## 2016-03-14 DIAGNOSIS — I779 Disorder of arteries and arterioles, unspecified: Secondary | ICD-10-CM | POA: Diagnosis not present

## 2016-03-14 MED ORDER — LOSARTAN POTASSIUM 100 MG PO TABS: 100.0000 mg | ORAL_TABLET | Freq: Every day | ORAL | Status: DC

## 2016-03-14 NOTE — Assessment & Plan Note (Signed)
History of hyperlipidemia on Praluent and Lovaza with recent lipid profile performed 12/23/15 bili total cholesterol was 62, LDL 61 and HDL of 82.

## 2016-03-14 NOTE — Assessment & Plan Note (Signed)
History of co coronary artery bypass grafting X 5 by Dr. Merilynn Finland 9/16/09with a free LIMA to the LAD, sequential vein to thevfirst diagonal branch first and second obtuse marginal branches and a vein to the PDA. His last Myoview performed 12/11/11 was nonischemic.Over the last several months he's noticed increasing dyspnea on exertion but denies chest pain. I'm going to get an exercise Myoview stress test and 2-D echo to further investigate whether this is ischemically mediated.

## 2016-03-14 NOTE — Addendum Note (Signed)
Addended by: Vanessa Ralphs on: 03/14/2016 08:42 AM   Modules accepted: Orders

## 2016-03-14 NOTE — Progress Notes (Signed)
03/14/2016 [name]   August 02, 1951  [id number]  Primary Physician [name], MD Primary Cardiologist: [name] MD [name], [place]  HPI:  The patient is a very pleasant 65 year old, thin-appearing, married Caucasian male, father of 70, grandfather to 4 grandchildren who I last saw 03/02/15. He has a history of coronary artery disease status post bypass grafting x5 by Dr. Merilynn Finland June 16, 2008, with a free LIMA to his LAD, sequential vein to the first diagonal branch, first and second obtuse marginal branches, as well as a vein to the PDA. His other problems include hypertension and hyperlipidemia. He denies chest pain or shortness of breath. I did perform cerebral angiography on him May 05, 2008, because of a question of a high-grade internal carotid artery stenosis read by the radiologist. However, this turned out to be a left external carotid artery stenosis. He does have high-grade ostial left internal mammary artery stenosis which necessitated the use of a free LIMA. He is statin intolerant. 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 A Myoview stress test performed December 11, 2011, was normal and carotid Dopplers did show a moderately severe right ICA stenosis which we have been following by duplex ultrasound. 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 Since I saw him 12 months ago he has noticed increasing dyspnea on exertion over the last several months, especially noticeable while mowing his lawn.   Current Outpatient Prescriptions  Medication Sig Dispense Refill  . Alirocumab (PRALUENT) 150 MG/ML SOPN Inject 150 mg into the skin every 14 (fourteen) days. 2 pen 11  . amLODipine (NORVASC) 10 MG tablet TAKE 1 TABLET (10 MG TOTAL)  BY MOUTH DAILY. 90 tablet 0  . amphetamine-dextroamphetamine (ADDERALL) 30 MG tablet Take 30 mg by mouth 3 (three) times daily.      Marland Kitchen aspirin 81 MG tablet Take 81 mg by mouth daily.    Marland Kitchen CIALIS 20 MG tablet TAKE 1 TABLET (20 MG TOTAL) BY MOUTH DAILY AS NEEDED FOR ERECTILE DYSFUNCTION. 18 tablet 3  . esomeprazole (NEXIUM) 20 MG capsule Take 20 mg by mouth daily at 12 noon.    Marland Kitchen ibuprofen (ADVIL,MOTRIN) 200 MG tablet Take 200 mg by mouth daily.    Marland Kitchen levothyroxine (SYNTHROID, LEVOTHROID) 75 MCG tablet TAKE 1 TABLET (75 MCG TOTAL) BY MOUTH DAILY. 90 tablet 1  . LORazepam (ATIVAN) 1 MG tablet Take 1 mg by mouth every 8 (eight) hours as needed for anxiety.     Marland Kitchen losartan (COZAAR) 50 MG tablet TAKE 1 TABLET (50 MG TOTAL) BY MOUTH DAILY. 30 tablet 8  . metoprolol succinate (TOPROL-XL) 25 MG 24 hr tablet TAKE 1 TABLET (25 MG TOTAL) BY MOUTH DAILY. 90 tablet 1  . omega-3 acid ethyl esters (LOVAZA) 1 G capsule Take 2 capsules (2 g total) by mouth 2 (two) times daily. 120 capsule 5  . TRINTELLIX 5 MG TABS   2   No current facility-administered medications for this visit.    Allergies  Allergen Reactions  . Crestor [Rosuvastatin Calcium] Other (See Comments)    Difficulty swallowing  . Lipitor [Atorvastatin Calcium] Other (See Comments)    Muscle spasms  . Lisinopril Cough  . Tricor [Fenofibrate]     Social History   Social  History  . Marital Status: Married    Spouse Name: N/A  . Number of Children: N/A  . Years of Education: N/A   Occupational History  . Not on file.   Social History Main Topics  . Smoking status: Former Smoker -- 3.00 packs/day for 30 years    Types: Cigarettes    Quit date: 01/29/2001  . Smokeless tobacco: Not on file  . Alcohol Use: 4.2 oz/week    7 Cans of beer per week     Comment: daily  . Drug Use: No  . Sexual Activity: Not on file   Other Topics Concern  . Not on file   Social History Narrative     Review of Systems: General: negative for chills,  fever, night sweats or weight changes.  Cardiovascular: negative for chest pain, dyspnea on exertion, edema, orthopnea, palpitations, paroxysmal nocturnal dyspnea or shortness of breath Dermatological: negative for rash Respiratory: negative for cough or wheezing Urologic: negative for hematuria Abdominal: negative for nausea, vomiting, diarrhea, bright red blood per rectum, melena, or hematemesis Neurologic: negative for visual changes, syncope, or dizziness All other systems reviewed and are otherwise negative except as noted above.    Blood pressure 156/98, pulse 75, height 5\' 7"  (1.702 m), weight 163 lb 3.2 oz (74.027 kg).  General appearance: alert and no distress Neck: no adenopathy, no JVD, supple, symmetrical, trachea midline, thyroid not enlarged, symmetric, no tenderness/mass/nodules and bilateral carotid and subclavian bruits Lungs: normal percussion bilaterally Heart: regular rate and rhythm, S1, S2 normal, no murmur, click, rub or gallop Extremities: extremities normal, atraumatic, no cyanosis or edema  EKG normal sinus rhythm at 75 with right bundle branch block and left axis deviation. I personally reviewed this EKG  ASSESSMENT AND PLAN:   Coronary artery disease History of co coronary artery bypass grafting X 5 by Dr. Merilynn Finland 9/16/09with a free LIMA to the LAD, sequential vein to thevfirst diagonal branch first and second obtuse marginal branches and a vein to the PDA. His last Myoview performed 12/11/11 was nonischemic.Over the last several months he's noticed increasing dyspnea on exertion but denies chest pain. I'm going to get an exercise Myoview stress test and 2-D echo to further investigate whether this is ischemically mediated.  Hypertension History of hypertension and blood pressure measured today at 156/98 in the right arm.He does have a right subclavian artery stenosis with a documented 25 mm blood pressure differential in his upper extremities. We will  recheck blood pressures in both arms today. He is on amlodipine, metoprolol and losartan. Continued current meds at current dosing.  Internal carotid artery stenosis History of carotid artery disease with Doppler performed 01/17/15 revealing moderately severe right and moderate left ICA stenosis. It should be noted that he does have bilateral subclavian artery stenosis right greater than left. He's had neck radiation in the past because of head and neck cancer back in 2000 Should he require carotid intervention it would most likely need to be percutaneous because of his "Hostile neck".  Hyperlipidemia History of hyperlipidemia on Praluent and Lovaza with recent lipid profile performed 12/23/15 bili total cholesterol was 62, LDL 61 and HDL of 82.      [name] MD FACP,FACC,FAHA, [facility] 03/14/2016 8:32 AM

## 2016-03-14 NOTE — Assessment & Plan Note (Signed)
History of hypertension and blood pressure measured today at 156/98 in the right arm.He does have a right subclavian artery stenosis with a documented 25 mm blood pressure differential in his upper extremities. We will recheck blood pressures in both arms today. He is on amlodipine, metoprolol and losartan. Continued current meds at current dosing.

## 2016-03-14 NOTE — Patient Instructions (Addendum)
Medication Instructions:  Your physician has recommended you make the following change in your medication:  1- INCREASE Losartan to 100mg  by mouth daily. You may take 2 tablets of 50mg  once a day until you pick up your new prescription.    Labwork: none  Testing/Procedures: Your physician has requested that you have an echocardiogram. Echocardiography is a painless test that uses sound waves to create images of your heart. It provides your doctor with information about the size and shape of your heart and how well your heart's chambers and valves are working. This procedure takes approximately one hour. There are no restrictions for this procedure.  Your physician has requested that you have a carotid duplex. This test is an ultrasound of the carotid arteries in your neck. It looks at blood flow through these arteries that supply the brain with blood. Allow one hour for this exam. There are no restrictions or special instructions.  Your physician has requested that you have en exercise stress myoview. For further information please visit [email or web address]. Please follow instruction sheet, as given.   Follow-Up: Your physician wants you to follow-up in: [place]. You will receive a reminder letter in the mail two months in advance. If you don't receive a letter, please call our office to schedule the follow-up appointment.  You have been referred to Blood Pressure Clinic with pharmacist.  Please take your Blood pressure once a day for 1 month. Bring those readings along with your Blood pressure cuff to your appointment with the BP clinic.   Any Other Special Instructions Will Be Listed Below (If Applicable).     If you need a refill on your cardiac medications before your next appointment, please call your pharmacy.

## 2016-03-14 NOTE — Assessment & Plan Note (Signed)
History of carotid artery disease with Doppler performed 01/17/15 revealing moderately severe right and moderate left ICA stenosis. It should be noted that he does have bilateral subclavian artery stenosis right greater than left. He's had neck radiation in the past because of head and neck cancer back in 2000 Should he require carotid intervention it would most likely need to be percutaneous because of his "Hostile neck".

## 2016-03-19 ENCOUNTER — Ambulatory Visit (HOSPITAL_COMMUNITY): Payer: 59 | Attending: Internal Medicine

## 2016-03-19 ENCOUNTER — Other Ambulatory Visit: Payer: Self-pay

## 2016-03-19 DIAGNOSIS — I779 Disorder of arteries and arterioles, unspecified: Secondary | ICD-10-CM | POA: Insufficient documentation

## 2016-03-19 DIAGNOSIS — R0600 Dyspnea, unspecified: Secondary | ICD-10-CM | POA: Diagnosis present

## 2016-03-19 DIAGNOSIS — Z4789 Encounter for other orthopedic aftercare: Secondary | ICD-10-CM | POA: Diagnosis not present

## 2016-03-19 DIAGNOSIS — I340 Nonrheumatic mitral (valve) insufficiency: Secondary | ICD-10-CM | POA: Diagnosis not present

## 2016-03-19 DIAGNOSIS — I739 Peripheral vascular disease, unspecified: Secondary | ICD-10-CM

## 2016-03-19 DIAGNOSIS — E785 Hyperlipidemia, unspecified: Secondary | ICD-10-CM | POA: Diagnosis not present

## 2016-03-19 DIAGNOSIS — I10 Essential (primary) hypertension: Secondary | ICD-10-CM | POA: Diagnosis not present

## 2016-03-19 DIAGNOSIS — R0609 Other forms of dyspnea: Secondary | ICD-10-CM | POA: Insufficient documentation

## 2016-03-19 DIAGNOSIS — Z87891 Personal history of nicotine dependence: Secondary | ICD-10-CM | POA: Insufficient documentation

## 2016-03-19 DIAGNOSIS — M25511 Pain in right shoulder: Secondary | ICD-10-CM | POA: Diagnosis not present

## 2016-03-19 LAB — ECHOCARDIOGRAM COMPLETE
AVLVOTPG: 5 mmHg
CHL CUP DOP CALC LVOT VTI: 22.8 cm
CHL CUP MV DEC (S): 201
EERAT: 12.94
EWDT: 201 ms
FS: 39 % (ref 28–44)
IV/PV OW: 1.15
LA ID, A-P, ES: 42 mm
LA diam end sys: 42 mm
LA vol A4C: 41 ml
LADIAMINDEX: 2.27 cm/m2
LAVOL: 51 mL
LAVOLIN: 27.6 mL/m2
LV E/e' medial: 12.94
LV TDI E'LATERAL: 9.43
LVEEAVG: 12.94
LVELAT: 9.43 cm/s
LVOT SV: 79 mL
LVOT area: 3.46 cm2
LVOT diameter: 21 mm
LVOT peak vel: 113 cm/s
MV Peak grad: 6 mmHg
MV pk A vel: 73.4 m/s
MVPKEVEL: 122 m/s
PW: 11.5 mm — AB (ref 0.6–1.1)
TDI e' medial: 7.68

## 2016-03-19 MED FILL — traMADol HCL 50 MG TABS: 50 | 5 days supply | Qty: 60 | Fill #0

## 2016-03-23 MED FILL — LOSARTAN POTASSIUM 50 MG TA: 50 | 90 days supply | Qty: 90 | Fill #2

## 2016-03-28 ENCOUNTER — Telehealth (HOSPITAL_COMMUNITY): Payer: Self-pay

## 2016-03-28 NOTE — Telephone Encounter (Signed)
Encounter complete. 

## 2016-03-29 ENCOUNTER — Ambulatory Visit (HOSPITAL_BASED_OUTPATIENT_CLINIC_OR_DEPARTMENT_OTHER)
Admission: RE | Admit: 2016-03-29 | Discharge: 2016-03-29 | Disposition: A | Discharge status: Home / Self Care | Payer: 59 | Source: Ambulatory Visit | Attending: Cardiovascular Disease | Admitting: Cardiovascular Disease

## 2016-03-29 ENCOUNTER — Ambulatory Visit (HOSPITAL_COMMUNITY)
Admission: RE | Admit: 2016-03-29 | Discharge: 2016-03-29 | Disposition: A | Discharge status: Home / Self Care | Payer: 59 | Source: Ambulatory Visit | Attending: Cardiology | Admitting: Cardiology

## 2016-03-29 DIAGNOSIS — I6523 Occlusion and stenosis of bilateral carotid arteries: Secondary | ICD-10-CM | POA: Diagnosis not present

## 2016-03-29 DIAGNOSIS — I10 Essential (primary) hypertension: Secondary | ICD-10-CM | POA: Diagnosis not present

## 2016-03-29 DIAGNOSIS — R0609 Other forms of dyspnea: Secondary | ICD-10-CM | POA: Insufficient documentation

## 2016-03-29 DIAGNOSIS — Z8249 Family history of ischemic heart disease and other diseases of the circulatory system: Secondary | ICD-10-CM | POA: Insufficient documentation

## 2016-03-29 DIAGNOSIS — Z87891 Personal history of nicotine dependence: Secondary | ICD-10-CM | POA: Diagnosis not present

## 2016-03-29 DIAGNOSIS — R5383 Other fatigue: Secondary | ICD-10-CM | POA: Insufficient documentation

## 2016-03-29 DIAGNOSIS — I4510 Unspecified right bundle-branch block: Secondary | ICD-10-CM | POA: Diagnosis not present

## 2016-03-29 DIAGNOSIS — I779 Disorder of arteries and arterioles, unspecified: Secondary | ICD-10-CM | POA: Insufficient documentation

## 2016-03-29 DIAGNOSIS — R42 Dizziness and giddiness: Secondary | ICD-10-CM | POA: Diagnosis not present

## 2016-03-29 DIAGNOSIS — R002 Palpitations: Secondary | ICD-10-CM | POA: Diagnosis not present

## 2016-03-29 DIAGNOSIS — M25511 Pain in right shoulder: Secondary | ICD-10-CM | POA: Diagnosis not present

## 2016-03-29 DIAGNOSIS — M25512 Pain in left shoulder: Secondary | ICD-10-CM | POA: Diagnosis not present

## 2016-03-29 DIAGNOSIS — I739 Peripheral vascular disease, unspecified: Secondary | ICD-10-CM

## 2016-03-29 DIAGNOSIS — R9439 Abnormal result of other cardiovascular function study: Secondary | ICD-10-CM | POA: Insufficient documentation

## 2016-03-29 LAB — MYOCARDIAL PERFUSION IMAGING
CHL CUP RESTING HR STRESS: 86 {beats}/min
LV sys vol: 51 mL
LVDIAVOL: 121 mL (ref 62–150)
NUC STRESS TID: 0.94
Peak HR: 92 {beats}/min
SDS: 10
SRS: 13
SSS: 23

## 2016-03-29 MED ORDER — TECHNETIUM TC 99M TETROFOSMIN IV KIT
9.4000 | PACK | Freq: Once | INTRAVENOUS | Status: AC | PRN
  Administered 2016-03-29: 9.4 via INTRAVENOUS
  Filled 2016-03-29: qty 9

## 2016-03-29 MED ORDER — TECHNETIUM TC 99M TETROFOSMIN IV KIT
29.4000 | PACK | Freq: Once | INTRAVENOUS | Status: AC | PRN
  Administered 2016-03-29: 29.4 via INTRAVENOUS
  Filled 2016-03-29: qty 29

## 2016-03-29 MED ORDER — REGADENOSON 0.4 MG/5ML IV SOLN
0.4000 mg | Freq: Once | INTRAVENOUS | Status: AC
  Administered 2016-03-29: 0.4 mg via INTRAVENOUS

## 2016-03-30 MED FILL — CIALIS 20 MG TABLET: 20 | 30 days supply | Qty: 6 | Fill #5

## 2016-04-02 MED FILL — LORazepam 1 MG TABS: 1 | 90 days supply | Qty: 360 | Fill #0

## 2016-04-04 MED FILL — PRALUENT 150 MG/ML PEN: 150 | 30 days supply | Qty: 2 | Fill #9

## 2016-04-05 DIAGNOSIS — S46011A Strain of muscle(s) and tendon(s) of the rotator cuff of right shoulder, initial encounter: Secondary | ICD-10-CM | POA: Diagnosis not present

## 2016-04-05 DIAGNOSIS — M75112 Incomplete rotator cuff tear or rupture of left shoulder, not specified as traumatic: Secondary | ICD-10-CM | POA: Diagnosis not present

## 2016-04-05 DIAGNOSIS — M19011 Primary osteoarthritis, right shoulder: Secondary | ICD-10-CM | POA: Diagnosis not present

## 2016-04-05 DIAGNOSIS — S43431A Superior glenoid labrum lesion of right shoulder, initial encounter: Secondary | ICD-10-CM | POA: Diagnosis not present

## 2016-04-09 ENCOUNTER — Other Ambulatory Visit: Payer: Self-pay | Admitting: Cardiovascular Disease

## 2016-04-09 MED FILL — METOPROLOL SUCC ER 25 MG TA: 25 | 90 days supply | Qty: 90 | Fill #0

## 2016-04-10 ENCOUNTER — Encounter: Payer: Self-pay | Admitting: Cardiovascular Disease

## 2016-04-10 ENCOUNTER — Telehealth: Payer: Self-pay | Admitting: *Deleted

## 2016-04-10 DIAGNOSIS — I6529 Occlusion and stenosis of unspecified carotid artery: Secondary | ICD-10-CM

## 2016-04-10 DIAGNOSIS — I739 Peripheral vascular disease, unspecified: Secondary | ICD-10-CM

## 2016-04-10 DIAGNOSIS — I779 Disorder of arteries and arterioles, unspecified: Secondary | ICD-10-CM

## 2016-04-10 NOTE — Telephone Encounter (Signed)
-----   Message from [name], MD sent at 03/30/2016  7:02 AM EDT ----- No change from prior study. Repeat in 12 months. Pt has high grade Right inominant stenosis with BP differential. If he has RUE claud or subclavian steal symptoms he should see me back to discuss

## 2016-04-13 ENCOUNTER — Telehealth: Payer: Self-pay | Admitting: Cardiovascular Disease

## 2016-04-13 ENCOUNTER — Telehealth: Payer: Self-pay | Admitting: *Deleted

## 2016-04-13 NOTE — Telephone Encounter (Signed)
Requesting surgical clearance:   1. Type of surgery: Right shoulder: right shoulder scope, A-SAD, mini open RCR,subscap, open DCR,RCR-mini-open chronic/over 3 months, open resection distal clavical  2. Surgeon: Dr Esmond Plants  3. Surgical date: pending receipt of clearance  4. Medications that need to be held: aspirin  5. CAD: yes     6. I will defer to: Dr Gwenlyn Found.  [place] [phone] Phone [phone]

## 2016-04-13 NOTE — Telephone Encounter (Signed)
Spoke with patient via telephone. See encounter for details.

## 2016-04-13 NOTE — Telephone Encounter (Signed)
Received incoming from patient. He wanted to know if Dr Gwenlyn Found had given him clearance for his upcoming shoulder surgery. Patient has appt 05/01/16 to discuss results. Clearance received from Dr Veverly Fells and sent to Dr Gwenlyn Found via [facility].  Advised patient that Dr Gwenlyn Found is out of the office at this time and will address upon his return. Patient verbalized understanding. He is eager to have his surgery.

## 2016-04-15 NOTE — Telephone Encounter (Signed)
OK to hold Plavix. 

## 2016-04-17 ENCOUNTER — Ambulatory Visit (INDEPENDENT_AMBULATORY_CARE_PROVIDER_SITE_OTHER): Payer: 59 | Admitting: Pharmacist Clinician (PhC)/ Clinical Pharmacy Specialist

## 2016-04-17 ENCOUNTER — Encounter: Payer: Self-pay | Admitting: Pharmacist Clinician (PhC)/ Clinical Pharmacy Specialist

## 2016-04-17 VITALS — BP 148/82 | HR 72 | Ht 67.0 in | Wt 162.4 lb

## 2016-04-17 DIAGNOSIS — I10 Essential (primary) hypertension: Secondary | ICD-10-CM

## 2016-04-17 MED ORDER — HYDROCHLOROTHIAZIDE 12.5 MG PO CAPS: 12.5000 mg | ORAL_CAPSULE | Freq: Every day | ORAL | Status: DC

## 2016-04-17 MED FILL — HYDROCHLOROTHIAZIDE 12.5 MG: 12.5 | 90 days supply | Qty: 90 | Fill #0

## 2016-04-17 NOTE — Progress Notes (Signed)
04/17/2016 [name] 1951-04-14 [id number]   HPI:  DAMIAN PROVANCE is a 65 y.o. male patient of Dr Gwenlyn Found, with a PMH below who presents today for hypertension clinic evaluation.  We have worked with him in the past on his hyperlipidemia and he currently takes Praluent 150 mg every 2 weeks.  His last LDL was at 61.  Today he is here to discuss his BP, which has been fluctuating between [id number] systolic and 123456 diastolic.  His dose of losartan was increased to 100 mg at his last appointment, but after 3-4 days dropped back to 50 mg due to weakness, fatigue and dizziness.  He does have R subclavian stenosis, and his BP in the R arm reads about 20-25 points lower than the left.  Blood Pressure Goal:  140/90   (despite his age)  Current Medications: losartan 50 mg, amlodipine 10 mg, metoprolol succ 25 mg  - takes all in the am  Cardiac Hx:  CABG x 5 (2009), hypertension, carotid artery stenosis, hyperlipidemia  Family Hx: father had CABG, CVA x 2, DM, died in his 72's; mother with carotid stenosis; 1 brother with hypertension  Social Hx: quit smoking 12-15 years ago; does drink 3-4 alcoholic beverages daily ([employer] and craft beers);   Diet: drinks 1/2 cup coffee per day; eats many meals out, especially [place] food.  Does occasionally add salt to his meals  Exercise: nothing formal; works Catering manager  Home BP readings:  Brought recording from tablet App; on L arm had 54% of readings normotensive.  High [id number] systolic, 96 diastolic.   Home cuff is from [employer] and reads within 5 points of the office cuff.  Intolerances:  Losartan 100 mg caused weakness, muscle fatigue and dizziness  Wt Readings from Last 3 Encounters:  04/17/16 162 lb 6.4 oz (73.664 kg)  03/29/16 163 lb (73.936 kg)  03/14/16 163 lb 3.2 oz (74.027 kg)   BP Readings from Last 3 Encounters:  04/17/16 148/82  03/14/16 175/100  12/26/15 172/80   Pulse Readings from Last 3 Encounters:    04/17/16 72  03/14/16 75  12/26/15 76    Current Outpatient Prescriptions  Medication Sig Dispense Refill  . Alirocumab (PRALUENT) 150 MG/ML SOPN Inject 150 mg into the skin every 14 (fourteen) days. 2 pen 11  . amLODipine (NORVASC) 10 MG tablet TAKE 1 TABLET (10 MG TOTAL) BY MOUTH DAILY. 90 tablet 0  . amphetamine-dextroamphetamine (ADDERALL) 30 MG tablet Take 30 mg by mouth 3 (three) times daily.      Marland Kitchen aspirin 81 MG tablet Take 81 mg by mouth daily.    Marland Kitchen CIALIS 20 MG tablet TAKE 1 TABLET (20 MG TOTAL) BY MOUTH DAILY AS NEEDED FOR ERECTILE DYSFUNCTION. 18 tablet 3  . esomeprazole (NEXIUM) 20 MG capsule Take 20 mg by mouth daily at 12 noon.    . hydrochlorothiazide (MICROZIDE) 12.5 MG capsule Take 1 capsule (12.5 mg total) by mouth daily. 90 capsule 1  . ibuprofen (ADVIL,MOTRIN) 200 MG tablet Take 200 mg by mouth daily.    Marland Kitchen levothyroxine (SYNTHROID, LEVOTHROID) 75 MCG tablet TAKE 1 TABLET (75 MCG TOTAL) BY MOUTH DAILY. 90 tablet 1  . LORazepam (ATIVAN) 1 MG tablet Take 1 mg by mouth every 8 (eight) hours as needed for anxiety.     Marland Kitchen losartan (COZAAR) 50 MG tablet Take 50 mg by mouth daily.  8  . metoprolol succinate (TOPROL-XL) 25 MG 24 hr tablet TAKE 1 TABLET (  25 MG TOTAL) BY MOUTH DAILY. 90 tablet 3  . omega-3 acid ethyl esters (LOVAZA) 1 G capsule Take 2 capsules (2 g total) by mouth 2 (two) times daily. 120 capsule 5  . TRINTELLIX 5 MG TABS   2   No current facility-administered medications for this visit.    Allergies  Allergen Reactions  . Crestor [Rosuvastatin Calcium] Other (See Comments)    Difficulty swallowing  . Lipitor [Atorvastatin Calcium] Other (See Comments)    Muscle spasms  . Lisinopril Cough  . Tricor [Fenofibrate]     Past Medical History  Diagnosis Date  . CAD (coronary artery disease)   . Squamous cell cancer of tongue ([place])   . HTN (hypertension), benign 04/16/2008    echo - EF >55%; no regional wall or valvular abnormalities  . Depression, major  ([place])   . COPD (chronic obstructive pulmonary disease) ([place]) 12/11/2011    r/s mv - EF 72%; exercise capacity 13 METS; no exercised induced ischemic EKG changes  . Subclavian steal syndrome 06/17/2012    carotid doppler - R systolic brachial pressure 144mmHg, L 137mmHg; R subclavian artery - proxmial obsstruction w/ abnormal monophasic waveforms, R ECA known occlusive disease; L ECA narrowing w/ 70-99% diameter reductiona  . Hyperlipidemia     statin intolerant  . Carotid artery stenosis   . Hypothyroidism     Blood pressure 148/82, pulse 72, height 5\' 7"  (1.702 m), weight 162 lb 6.4 oz (73.664 kg).    Tommy Medal PharmD CPP [facility]

## 2016-04-17 NOTE — Patient Instructions (Signed)
Return for a a follow up appointment with Dr. Gwenlyn Found on August 1  Your blood pressure today is 148/82  (goal is < 140/90)  Check your blood pressure at home daily (if able) and keep record of the readings.  Take your BP meds as follows: add hydrochlorothiazide 12.5 mg each morning with losartan.  Move amlodipine and metoprolol to evenings  Bring all of your meds, your BP cuff and your record of home blood pressures to your next appointment.  Exercise as you're able, try to walk approximately 30 minutes per day.  Keep salt intake to a minimum, especially watch canned and prepared boxed foods.  Eat more fresh fruits and vegetables and fewer canned items.  Avoid eating in fast food restaurants.    HOW TO TAKE YOUR BLOOD PRESSURE: . Rest 5 minutes before taking your blood pressure. .  Don't smoke or drink caffeinated beverages for at least 30 minutes before. . Take your blood pressure before (not after) you eat. . Sit comfortably with your back supported and both feet on the floor (don't cross your legs). . Elevate your arm to heart level on a table or a desk. . Use the proper sized cuff. It should fit smoothly and snugly around your bare upper arm. There should be enough room to slip a fingertip under the cuff. The bottom edge of the cuff should be 1 inch above the crease of the elbow. . Ideally, take 3 measurements at one sitting and record the average.

## 2016-04-17 NOTE — Telephone Encounter (Signed)
Clarification: Verified with Dr Gwenlyn Found that patient may hold his aspirin (patient not on Plavix) prior to the procedure and is cleared at low risk.  Message routed to number provided via EPIC.

## 2016-04-17 NOTE — Telephone Encounter (Signed)
Notified patient that he had been cleared by for surgery.  He was very Patent attorney.

## 2016-04-17 NOTE — Assessment & Plan Note (Signed)
Today his BP in the office is 148/82.  Will try to keep his pressure closer to the 140 goal, as he is just 65 and fairly active.  Will add hctz 12.5 mg once daily to his current regimen.  I did ask that he move the amlodipine and metoprolol to bedtime and continue with the losartan and hctz in the mornings.  He will see Dr. Gwenlyn Found in about 2-3 weeks and we can follow up with him afterward if needed.  He will go to the lab in 7-10 days for a BMET after starting the hctz.

## 2016-04-28 ENCOUNTER — Encounter: Payer: Self-pay | Admitting: Internal Medicine

## 2016-05-01 ENCOUNTER — Telehealth: Payer: Self-pay

## 2016-05-01 ENCOUNTER — Encounter: Payer: Self-pay | Admitting: Internal Medicine

## 2016-05-01 ENCOUNTER — Encounter: Payer: Self-pay | Admitting: Cardiovascular Disease

## 2016-05-01 ENCOUNTER — Ambulatory Visit (INDEPENDENT_AMBULATORY_CARE_PROVIDER_SITE_OTHER): Payer: 59 | Admitting: Internal Medicine

## 2016-05-01 ENCOUNTER — Ambulatory Visit (INDEPENDENT_AMBULATORY_CARE_PROVIDER_SITE_OTHER): Payer: 59 | Admitting: Cardiovascular Disease

## 2016-05-01 VITALS — BP 92/64 | HR 76 | Ht 67.0 in | Wt 164.0 lb

## 2016-05-01 VITALS — BP 126/72 | HR 81 | Temp 97.5°F | Ht 67.0 in | Wt 160.0 lb

## 2016-05-01 DIAGNOSIS — K429 Umbilical hernia without obstruction or gangrene: Secondary | ICD-10-CM | POA: Diagnosis not present

## 2016-05-01 DIAGNOSIS — Z79899 Other long term (current) drug therapy: Secondary | ICD-10-CM

## 2016-05-01 DIAGNOSIS — D689 Coagulation defect, unspecified: Secondary | ICD-10-CM

## 2016-05-01 DIAGNOSIS — R9439 Abnormal result of other cardiovascular function study: Secondary | ICD-10-CM

## 2016-05-01 LAB — CBC WITH DIFFERENTIAL/PLATELET
BASOS PCT: 1 %
Basophils Absolute: 53 cells/uL (ref 0–200)
EOS ABS: 212 {cells}/uL (ref 15–500)
Eosinophils Relative: 4 %
HEMATOCRIT: 46.3 % (ref 38.5–50.0)
Hemoglobin: 15.7 g/dL (ref 13.2–17.1)
LYMPHS PCT: 14 %
Lymphs Abs: 742 cells/uL — ABNORMAL LOW (ref 850–3900)
MCH: 29.3 pg (ref 27.0–33.0)
MCHC: 33.9 g/dL (ref 32.0–36.0)
MCV: 86.5 fL (ref 80.0–100.0)
MONOS PCT: 11 %
MPV: 9.3 fL (ref 7.5–12.5)
Monocytes Absolute: 583 cells/uL (ref 200–950)
NEUTROS PCT: 70 %
Neutro Abs: 3710 cells/uL (ref 1500–7800)
PLATELETS: 193 10*3/uL (ref 140–400)
RBC: 5.35 MIL/uL (ref 4.20–5.80)
RDW: 14.8 % (ref 11.0–15.0)
WBC: 5.3 10*3/uL (ref 3.8–10.8)

## 2016-05-01 MED FILL — PRALUENT 150 MG/ML PEN: 150 | 30 days supply | Qty: 2 | Fill #10

## 2016-05-01 MED FILL — traMADol HCL 50 MG TABS: 50 | 5 days supply | Qty: 60 | Fill #0

## 2016-05-01 MED FILL — CIALIS 20 MG TABLET: 20 | 30 days supply | Qty: 6 | Fill #6

## 2016-05-01 NOTE — Progress Notes (Signed)
05/01/2016 [name]   01/29/1951  [id number]  Primary Physician [name], MD Primary Cardiologist: [name] MD [name], [place]  HPI:  The patient is a very pleasant 65 year old, thin-appearing, married Caucasian male, father of 75, grandfather to 4 grandchildren who I last saw 03/14/16. He has a history of coronary artery disease status post bypass grafting x5 by Dr. Merilynn Finland June 16, 2008, with a free LIMA to his LAD, sequential vein to the first diagonal branch, first and second obtuse marginal branches, as well as a vein to the PDA. His other problems include hypertension and hyperlipidemia. He denies chest pain or shortness of breath. I did perform cerebral angiography on him May 05, 2008, because of a question of a high-grade internal carotid artery stenosis read by the radiologist. However, this turned out to be a left external carotid artery stenosis. He does have high-grade ostial left internal mammary artery stenosis which necessitated the use of a free LIMA. He is statin intolerant. 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 A Myoview stress test performed December 11, 2011, was normal and carotid Dopplers did show a moderately severe right ICA stenosis which we have been following by duplex ultrasound. 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 Since I saw him 12 months ago he has noticed increasing dyspnea on exertion over the last several months, especially noticeable while mowing his lawn. I obtained a Myoview stress test on him based on this 03/29/16 that showed a large reversible lateral wall perfusion [place] consistent with ischemia.   Current Outpatient Prescriptions  Medication Sig Dispense Refill  . Alirocumab  (PRALUENT) 150 MG/ML SOPN Inject 150 mg into the skin every 14 (fourteen) days. 2 pen 11  . amLODipine (NORVASC) 10 MG tablet TAKE 1 TABLET (10 MG TOTAL) BY MOUTH DAILY. 90 tablet 0  . amphetamine-dextroamphetamine (ADDERALL) 30 MG tablet Take 30 mg by mouth 3 (three) times daily.      Marland Kitchen aspirin 81 MG tablet Take 81 mg by mouth daily.    Marland Kitchen CIALIS 20 MG tablet TAKE 1 TABLET (20 MG TOTAL) BY MOUTH DAILY AS NEEDED FOR ERECTILE DYSFUNCTION. 18 tablet 3  . esomeprazole (NEXIUM) 20 MG capsule Take 20 mg by mouth daily at 12 noon.    . hydrochlorothiazide (MICROZIDE) 12.5 MG capsule Take 1 capsule (12.5 mg total) by mouth daily. 90 capsule 1  . ibuprofen (ADVIL,MOTRIN) 200 MG tablet Take 200 mg by mouth daily.    Marland Kitchen levothyroxine (SYNTHROID, LEVOTHROID) 75 MCG tablet TAKE 1 TABLET (75 MCG TOTAL) BY MOUTH DAILY. 90 tablet 1  . LORazepam (ATIVAN) 1 MG tablet Take 1 mg by mouth every 8 (eight) hours as needed for anxiety.     Marland Kitchen losartan (COZAAR) 50 MG tablet Take 1 tablet by mouth daily.  8  . metoprolol succinate (TOPROL-XL) 25 MG 24 hr tablet TAKE 1 TABLET (25 MG TOTAL) BY MOUTH DAILY. 90 tablet 3  . omega-3 acid ethyl esters (LOVAZA) 1 G capsule Take 2 capsules (2 g total) by mouth 2 (two) times daily. 120 capsule 5  . traMADol (ULTRAM) 50 MG tablet Take 0.5 tablets by mouth daily as needed.  0  . TRINTELLIX 5 MG TABS   2   No  current facility-administered medications for this visit.     Allergies  Allergen Reactions  . Crestor [Rosuvastatin Calcium] Other (See Comments)    Difficulty swallowing  . Lipitor [Atorvastatin Calcium] Other (See Comments)    Muscle spasms  . Lisinopril Cough  . Tricor [Fenofibrate]     Social History   Social History  . Marital status: Married    Spouse name: N/A  . Number of children: N/A  . Years of education: N/A   Occupational History  . Not on file.   Social History Main Topics  . Smoking status: Former Smoker    Packs/day: 3.00    Years: 30.00     Types: Cigarettes    Quit date: 01/29/2001  . Smokeless tobacco: Never Used  . Alcohol use 4.2 oz/week    7 Cans of beer per week     Comment: daily  . Drug use: No  . Sexual activity: Not on file   Other Topics Concern  . Not on file   Social History Narrative  . No narrative on file     Review of Systems: General: negative for chills, fever, night sweats or weight changes.  Cardiovascular: negative for chest pain, dyspnea on exertion, edema, orthopnea, palpitations, paroxysmal nocturnal dyspnea or shortness of breath Dermatological: negative for rash Respiratory: negative for cough or wheezing Urologic: negative for hematuria Abdominal: negative for nausea, vomiting, diarrhea, bright red blood per rectum, melena, or hematemesis Neurologic: negative for visual changes, syncope, or dizziness All other systems reviewed and are otherwise negative except as noted above.    Blood pressure 92/64, pulse 76, height 5\' 7"  (1.702 m), weight 164 lb (74.4 kg).  General appearance: alert and no distress Neck: no adenopathy, no JVD, supple, symmetrical, trachea midline, thyroid not enlarged, symmetric, no tenderness/mass/nodules and Bilateral carotid bruits Lungs: no adenopathy, no JVD, supple, symmetrical, trachea midline, thyroid not enlarged, symmetric, no tenderness/mass/nodules and Bilateral carotid bruits Heart: regular rate and rhythm, S1, S2 normal, no murmur, click, rub or gallop Extremities: extremities normal, atraumatic, no cyanosis or edema  EKG not performed today  ASSESSMENT AND PLAN:   Coronary artery disease History of CAD status post coronary artery bypass grafting X 5 by Dr. Merilynn Finland 06/16/08. He had a free LIMA to his LAD, sequential vein to the first diagonal branch, first and second obtuse marginal branches as well as a vein to the PDA. Since I saw him 03/14/16 she had a Myoview stress test that was read as intermediate risk the large lateral wall perfusion  abnormality. Based on this, I have suggested that we proceed with outpatient diagnostic coronary angiography. I will image his right subclavian artery as well during the procedure given its high degree of stenosis.      [name] MD FACP,FACC,FAHA, FSCAI 05/01/2016 3:00 PM

## 2016-05-01 NOTE — Patient Instructions (Signed)
Medication Instructions:  Your physician recommends that you continue on your current medications as directed. Please refer to the Current Medication list given to you today.   Testing/Procedures: Your physician has requested that you have a cardiac catheterization. Cardiac catheterization is used to diagnose and/or treat various heart conditions. Doctors may recommend this procedure for a number of different reasons. The most common reason is to evaluate chest pain. Chest pain can be a symptom of coronary artery disease (CAD), and cardiac catheterization can show whether plaque is narrowing or blocking your heart's arteries. This procedure is also used to evaluate the valves, as well as measure the blood flow and oxygen levels in different parts of your heart. For further information please visit [email or web address].   SCHEDULE FOR May 14, 2016.  Following your catheterization, you will not be allowed to drive for 3 days.  No lifting, pushing, or pulling greater that 10 pounds is allowed for 1 week.  You will be required to have the following tests prior to the procedure:  1. Blood work-the blood work can be done no more than 14 days prior to the procedure.  It can be done at any [facility] lab.  There is one downstairs on the first floor of this building and one in the [facility] building [phone], [address]).  2. Chest Xray-the chest xray order has already been placed at the [place].      Puncture site RIGHT GROIN   If you need a refill on your cardiac medications before your next appointment, please call your pharmacy.

## 2016-05-01 NOTE — Progress Notes (Addendum)
   Subjective:    Patient ID: [name], male    DOB: 11-Feb-1951, 65 y.o.   MRN: [id number]  HPI 65 year old Male with history of carotid artery disease, peripheral artery disease, subclavian steal syndrome, essential hypertension, attention deficit disorder, erectile dysfunction, depression, anxiety, hypothyroidism, hyperlipidemia, history of squamous cell carcinoma of the tongue, cervical disc disease.  Is planning to have right shoulder arthroscopy with decompression and many rotator cuff repair by Dr. Wiliam Ke mid-September.  He has a long-standing history of umbilical hernia. Recently it has begun to bother him. At one point on one particular day he felt it did not want to reduce easily.  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  He would like to have surgical consultation as soon as possible regarding getting his umbilical hernia repaired.  He's followed by Dr. Quay Burow for cardiac issues and recently had a Nuclear medicine stress test. Coronary angiography is being recommended.    Review of Systems see above     Objective:   Physical Exam  Abdomen is soft nondistended. He has proximal 1.5 centimeter umbilical hernia that is currently reducible      Assessment & Plan:  Symptomatic umbilical hernia  History of coronary disease  Plan: Patient has appointment to see Dr. Excell Seltzer in mid-August regarding repair.  He has upcoming coronary  angiography by Dr. Gwenlyn Found

## 2016-05-01 NOTE — Patient Instructions (Signed)
Appointment with Dr. Excell Seltzer regarding umbilical hernia repair mid-August

## 2016-05-01 NOTE — Assessment & Plan Note (Signed)
History of CAD status post coronary artery bypass grafting X 5 by Dr. Merilynn Finland 06/16/08. He had a free LIMA to his LAD, sequential vein to the first diagonal branch, first and second obtuse marginal branches as well as a vein to the PDA. Since I saw him 03/14/16 she had a Myoview stress test that was read as intermediate risk the large lateral wall perfusion abnormality. Based on this, I have suggested that we proceed with outpatient diagnostic coronary angiography. I will image his right subclavian artery as well during the procedure given its high degree of stenosis.

## 2016-05-01 NOTE — Telephone Encounter (Signed)
Spoke to patient. Advised referral sent to [facility].  Appt scheduled for : 05/18/16 @ 3:30 Advise will be notified by [place] office also.  Patient verbalized understanding.

## 2016-05-02 ENCOUNTER — Telehealth: Payer: Self-pay | Admitting: *Deleted

## 2016-05-02 ENCOUNTER — Other Ambulatory Visit: Payer: Self-pay | Admitting: *Deleted

## 2016-05-02 DIAGNOSIS — Z01818 Encounter for other preprocedural examination: Secondary | ICD-10-CM

## 2016-05-02 DIAGNOSIS — Z79899 Other long term (current) drug therapy: Secondary | ICD-10-CM

## 2016-05-02 DIAGNOSIS — R9439 Abnormal result of other cardiovascular function study: Secondary | ICD-10-CM

## 2016-05-02 LAB — PROTIME-INR
INR: 1
PROTHROMBIN TIME: 10.4 s (ref 9.0–11.5)

## 2016-05-02 LAB — BASIC METABOLIC PANEL
BUN: 19 mg/dL (ref 7–25)
CALCIUM: 9.5 mg/dL (ref 8.6–10.3)
CHLORIDE: 101 mmol/L (ref 98–110)
CO2: 27 mmol/L (ref 20–31)
CREATININE: 1.54 mg/dL — AB (ref 0.70–1.25)
Glucose, Bld: 106 mg/dL — ABNORMAL HIGH (ref 65–99)
Potassium: 3.8 mmol/L (ref 3.5–5.3)
Sodium: 142 mmol/L (ref 135–146)

## 2016-05-02 LAB — APTT: APTT: 27 s (ref 22–34)

## 2016-05-02 NOTE — Telephone Encounter (Signed)
Called patient and gave him Dr Kennon Holter advice and to recheck BMET next Tuesday. He verbalized understanding. Order entered into EPIC.

## 2016-05-02 NOTE — Telephone Encounter (Signed)
-----   Message from [name], MD sent at 05/02/2016  9:00 AM EDT ----- SCr elevated. Encourage PO fluids and re check BMET early next week

## 2016-05-10 ENCOUNTER — Ambulatory Visit
Admission: RE | Admit: 2016-05-10 | Discharge: 2016-05-10 | Disposition: A | Discharge status: Home / Self Care | Payer: 59 | Source: Ambulatory Visit | Attending: Cardiovascular Disease | Admitting: Cardiovascular Disease

## 2016-05-10 DIAGNOSIS — R9439 Abnormal result of other cardiovascular function study: Secondary | ICD-10-CM

## 2016-05-10 DIAGNOSIS — D689 Coagulation defect, unspecified: Secondary | ICD-10-CM

## 2016-05-10 DIAGNOSIS — Z01818 Encounter for other preprocedural examination: Secondary | ICD-10-CM | POA: Diagnosis not present

## 2016-05-10 DIAGNOSIS — Z79899 Other long term (current) drug therapy: Secondary | ICD-10-CM

## 2016-05-11 LAB — BASIC METABOLIC PANEL
BUN: 17 mg/dL (ref 7–25)
CHLORIDE: 101 mmol/L (ref 98–110)
CO2: 25 mmol/L (ref 20–31)
Calcium: 9.3 mg/dL (ref 8.6–10.3)
Creat: 1 mg/dL (ref 0.70–1.25)
Glucose, Bld: 99 mg/dL (ref 65–99)
POTASSIUM: 4.2 mmol/L (ref 3.5–5.3)
SODIUM: 139 mmol/L (ref 135–146)

## 2016-05-14 ENCOUNTER — Encounter (HOSPITAL_COMMUNITY): Payer: Self-pay | Admitting: Cardiovascular Disease

## 2016-05-14 ENCOUNTER — Ambulatory Visit (HOSPITAL_COMMUNITY)
Admission: RE | Admit: 2016-05-14 | Discharge: 2016-05-14 | Disposition: A | Discharge status: Home / Self Care | Payer: 59 | Source: Ambulatory Visit | Attending: Cardiovascular Disease | Admitting: Cardiovascular Disease

## 2016-05-14 ENCOUNTER — Encounter (HOSPITAL_COMMUNITY)
Admission: RE | Disposition: A | Discharge status: Home / Self Care | Payer: Self-pay | Source: Ambulatory Visit | Attending: Cardiovascular Disease

## 2016-05-14 DIAGNOSIS — Z923 Personal history of irradiation: Secondary | ICD-10-CM | POA: Diagnosis not present

## 2016-05-14 DIAGNOSIS — E039 Hypothyroidism, unspecified: Secondary | ICD-10-CM | POA: Insufficient documentation

## 2016-05-14 DIAGNOSIS — Z823 Family history of stroke: Secondary | ICD-10-CM | POA: Insufficient documentation

## 2016-05-14 DIAGNOSIS — J449 Chronic obstructive pulmonary disease, unspecified: Secondary | ICD-10-CM | POA: Insufficient documentation

## 2016-05-14 DIAGNOSIS — I10 Essential (primary) hypertension: Secondary | ICD-10-CM | POA: Diagnosis not present

## 2016-05-14 DIAGNOSIS — I25718 Atherosclerosis of autologous vein coronary artery bypass graft(s) with other forms of angina pectoris: Secondary | ICD-10-CM | POA: Insufficient documentation

## 2016-05-14 DIAGNOSIS — I25118 Atherosclerotic heart disease of native coronary artery with other forms of angina pectoris: Secondary | ICD-10-CM | POA: Diagnosis not present

## 2016-05-14 DIAGNOSIS — I771 Stricture of artery: Secondary | ICD-10-CM | POA: Diagnosis not present

## 2016-05-14 DIAGNOSIS — E785 Hyperlipidemia, unspecified: Secondary | ICD-10-CM | POA: Insufficient documentation

## 2016-05-14 DIAGNOSIS — F329 Major depressive disorder, single episode, unspecified: Secondary | ICD-10-CM | POA: Diagnosis not present

## 2016-05-14 DIAGNOSIS — Z85810 Personal history of malignant neoplasm of tongue: Secondary | ICD-10-CM | POA: Insufficient documentation

## 2016-05-14 DIAGNOSIS — I6522 Occlusion and stenosis of left carotid artery: Secondary | ICD-10-CM | POA: Insufficient documentation

## 2016-05-14 DIAGNOSIS — Z833 Family history of diabetes mellitus: Secondary | ICD-10-CM | POA: Insufficient documentation

## 2016-05-14 DIAGNOSIS — Z8041 Family history of malignant neoplasm of ovary: Secondary | ICD-10-CM | POA: Insufficient documentation

## 2016-05-14 DIAGNOSIS — Z7982 Long term (current) use of aspirin: Secondary | ICD-10-CM | POA: Insufficient documentation

## 2016-05-14 DIAGNOSIS — Z8249 Family history of ischemic heart disease and other diseases of the circulatory system: Secondary | ICD-10-CM | POA: Diagnosis not present

## 2016-05-14 DIAGNOSIS — I2583 Coronary atherosclerosis due to lipid rich plaque: Secondary | ICD-10-CM | POA: Diagnosis not present

## 2016-05-14 DIAGNOSIS — I2510 Atherosclerotic heart disease of native coronary artery without angina pectoris: Secondary | ICD-10-CM | POA: Diagnosis present

## 2016-05-14 DIAGNOSIS — I2582 Chronic total occlusion of coronary artery: Secondary | ICD-10-CM | POA: Diagnosis not present

## 2016-05-14 DIAGNOSIS — Z87891 Personal history of nicotine dependence: Secondary | ICD-10-CM | POA: Insufficient documentation

## 2016-05-14 DIAGNOSIS — R9439 Abnormal result of other cardiovascular function study: Secondary | ICD-10-CM

## 2016-05-14 DIAGNOSIS — I25110 Atherosclerotic heart disease of native coronary artery with unstable angina pectoris: Secondary | ICD-10-CM | POA: Diagnosis not present

## 2016-05-14 HISTORY — PX: CARDIAC CATHETERIZATION: SHX172

## 2016-05-14 SURGERY — LEFT HEART CATH AND CORS/GRAFTS ANGIOGRAPHY

## 2016-05-14 MED ORDER — LIDOCAINE HCL (PF) 1 % IJ SOLN
INTRAMUSCULAR | Status: DC | PRN
  Administered 2016-05-14: 25 mL via SUBCUTANEOUS

## 2016-05-14 MED ORDER — MIDAZOLAM HCL 2 MG/2ML IJ SOLN
INTRAMUSCULAR | Status: AC
  Filled 2016-05-14: qty 2

## 2016-05-14 MED ORDER — MIDAZOLAM HCL 2 MG/2ML IJ SOLN
INTRAMUSCULAR | Status: DC | PRN
  Administered 2016-05-14 (×2): 1 mg via INTRAVENOUS

## 2016-05-14 MED ORDER — ASPIRIN 81 MG PO CHEW
CHEWABLE_TABLET | ORAL | Status: AC
  Administered 2016-05-14: 81 mg via ORAL
  Filled 2016-05-14: qty 1

## 2016-05-14 MED ORDER — IOPAMIDOL (ISOVUE-370) INJECTION 76%
INTRAVENOUS | Status: AC
  Filled 2016-05-14: qty 50

## 2016-05-14 MED ORDER — SODIUM CHLORIDE 0.9 % IV SOLN: 250.0000 mL | INTRAVENOUS | Status: DC | PRN

## 2016-05-14 MED ORDER — IOPAMIDOL (ISOVUE-370) INJECTION 76%
INTRAVENOUS | Status: DC | PRN
  Administered 2016-05-14: 125 mL

## 2016-05-14 MED ORDER — FENTANYL CITRATE (PF) 100 MCG/2ML IJ SOLN
INTRAMUSCULAR | Status: AC
  Filled 2016-05-14: qty 2

## 2016-05-14 MED ORDER — SODIUM CHLORIDE 0.9 % WEIGHT BASED INFUSION
3.0000 mL/kg/h | INTRAVENOUS | Status: DC
  Administered 2016-05-14: 3 mL/kg/h via INTRAVENOUS

## 2016-05-14 MED ORDER — ASPIRIN 81 MG PO CHEW: 81.0000 mg | CHEWABLE_TABLET | Freq: Every day | ORAL | Status: DC

## 2016-05-14 MED ORDER — FENTANYL CITRATE (PF) 100 MCG/2ML IJ SOLN
INTRAMUSCULAR | Status: DC | PRN
  Administered 2016-05-14: 25 ug via INTRAVENOUS

## 2016-05-14 MED ORDER — SODIUM CHLORIDE 0.9% FLUSH: 3.0000 mL | INTRAVENOUS | Status: DC | PRN

## 2016-05-14 MED ORDER — ONDANSETRON HCL 4 MG/2ML IJ SOLN: 4.0000 mg | Freq: Four times a day (QID) | INTRAMUSCULAR | Status: DC | PRN

## 2016-05-14 MED ORDER — IOPAMIDOL (ISOVUE-370) INJECTION 76%
INTRAVENOUS | Status: AC
  Filled 2016-05-14: qty 100

## 2016-05-14 MED ORDER — SODIUM CHLORIDE 0.9 % WEIGHT BASED INFUSION: 3.0000 mL/kg/h | INTRAVENOUS | Status: AC

## 2016-05-14 MED ORDER — LIDOCAINE HCL (PF) 1 % IJ SOLN
INTRAMUSCULAR | Status: AC
  Filled 2016-05-14: qty 30

## 2016-05-14 MED ORDER — MORPHINE SULFATE (PF) 2 MG/ML IV SOLN: 2.0000 mg | INTRAVENOUS | Status: DC | PRN

## 2016-05-14 MED ORDER — ACETAMINOPHEN 325 MG PO TABS: 650.0000 mg | ORAL_TABLET | ORAL | Status: DC | PRN

## 2016-05-14 MED ORDER — SODIUM CHLORIDE 0.9 % WEIGHT BASED INFUSION: 1.0000 mL/kg/h | INTRAVENOUS | Status: DC

## 2016-05-14 MED ORDER — ASPIRIN 81 MG PO CHEW
81.0000 mg | CHEWABLE_TABLET | ORAL | Status: AC
  Administered 2016-05-14: 81 mg via ORAL

## 2016-05-14 MED ORDER — HEPARIN (PORCINE) IN NACL 2-0.9 UNIT/ML-% IJ SOLN
INTRAMUSCULAR | Status: AC
  Filled 2016-05-14: qty 1000

## 2016-05-14 MED ORDER — HEPARIN (PORCINE) IN NACL 2-0.9 UNIT/ML-% IJ SOLN
INTRAMUSCULAR | Status: DC | PRN
  Administered 2016-05-14: 14:00:00

## 2016-05-14 MED ORDER — SODIUM CHLORIDE 0.9% FLUSH: 3.0000 mL | Freq: Two times a day (BID) | INTRAVENOUS | Status: DC

## 2016-05-14 MED FILL — TRINTELLIX 5 MG TABLET: 5 | 30 days supply | Qty: 90 | Fill #0

## 2016-05-14 SURGICAL SUPPLY — 7 items
CATH INFINITI 5FR MULTPACK ANG (CATHETERS) ×2 IMPLANT
KIT HEART LEFT (KITS) ×3 IMPLANT
PACK CARDIAC CATHETERIZATION (CUSTOM PROCEDURE TRAY) ×3 IMPLANT
SHEATH PINNACLE 5F 10CM (SHEATH) ×2 IMPLANT
SYR MEDRAD MARK V 150ML (SYRINGE) ×3 IMPLANT
TRANSDUCER W/STOPCOCK (MISCELLANEOUS) ×3 IMPLANT
WIRE EMERALD 3MM-J .035X150CM (WIRE) ×2 IMPLANT

## 2016-05-14 NOTE — Progress Notes (Signed)
Client up and walked and tolerated well; right groin stable no bleeding or hematoma 

## 2016-05-14 NOTE — Progress Notes (Signed)
Site area: RFA Site Prior to Removal:  Level 0 Pressure Applied For:25 min Manual:   yes Patient Status During Pull:  Stable  Post Pull Site:  Level 0 Post Pull Instructions Given:yes   Post Pull Pulses Present: palpable Dressing Applied:  tegaderm Bedrest begins @ [id number] till [id number] Comments:

## 2016-05-14 NOTE — Discharge Instructions (Signed)

## 2016-05-14 NOTE — H&P (View-Only) (Signed)
05/01/2016 [name]   [birth year]  [id number]  Primary Physician [name], MD Primary Cardiologist: [name] MD [name], [place]  HPI:  The patient is a very pleasant 65 year old, thin-appearing, married Caucasian male, father of 57, grandfather to 4 grandchildren who I last saw 03/14/16. He has a history of coronary artery disease status post bypass grafting x5 by Dr. Merilynn Finland June 16, 2008, with a free LIMA to his LAD, sequential vein to the first diagonal branch, first and second obtuse marginal branches, as well as a vein to the PDA. His other problems include hypertension and hyperlipidemia. He denies chest pain or shortness of breath. I did perform cerebral angiography on him May 05, 2008, because of a question of a high-grade internal carotid artery stenosis read by the radiologist. However, this turned out to be a left external carotid artery stenosis. He does have high-grade ostial left internal mammary artery stenosis which necessitated the use of a free LIMA. He is statin intolerant. 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 A Myoview stress test performed December 11, 2011, was normal and carotid Dopplers did show a moderately severe right ICA stenosis which we have been following by duplex ultrasound. 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 Since I saw him 12 months ago he has noticed increasing dyspnea on exertion over the last several months, especially noticeable while mowing his lawn. I obtained a Myoview stress test on him based on this 03/29/16 that showed a large reversible lateral wall perfusion [place] consistent with ischemia.   Current Outpatient Prescriptions  Medication Sig Dispense Refill  . Alirocumab  (PRALUENT) 150 MG/ML SOPN Inject 150 mg into the skin every 14 (fourteen) days. 2 pen 11  . amLODipine (NORVASC) 10 MG tablet TAKE 1 TABLET (10 MG TOTAL) BY MOUTH DAILY. 90 tablet 0  . amphetamine-dextroamphetamine (ADDERALL) 30 MG tablet Take 30 mg by mouth 3 (three) times daily.      Marland Kitchen aspirin 81 MG tablet Take 81 mg by mouth daily.    Marland Kitchen CIALIS 20 MG tablet TAKE 1 TABLET (20 MG TOTAL) BY MOUTH DAILY AS NEEDED FOR ERECTILE DYSFUNCTION. 18 tablet 3  . esomeprazole (NEXIUM) 20 MG capsule Take 20 mg by mouth daily at 12 noon.    . hydrochlorothiazide (MICROZIDE) 12.5 MG capsule Take 1 capsule (12.5 mg total) by mouth daily. 90 capsule 1  . ibuprofen (ADVIL,MOTRIN) 200 MG tablet Take 200 mg by mouth daily.    Marland Kitchen levothyroxine (SYNTHROID, LEVOTHROID) 75 MCG tablet TAKE 1 TABLET (75 MCG TOTAL) BY MOUTH DAILY. 90 tablet 1  . LORazepam (ATIVAN) 1 MG tablet Take 1 mg by mouth every 8 (eight) hours as needed for anxiety.     Marland Kitchen losartan (COZAAR) 50 MG tablet Take 1 tablet by mouth daily.  8  . metoprolol succinate (TOPROL-XL) 25 MG 24 hr tablet TAKE 1 TABLET (25 MG TOTAL) BY MOUTH DAILY. 90 tablet 3  . omega-3 acid ethyl esters (LOVAZA) 1 G capsule Take 2 capsules (2 g total) by mouth 2 (two) times daily. 120 capsule 5  . traMADol (ULTRAM) 50 MG tablet Take 0.5 tablets by mouth daily as needed.  0  . TRINTELLIX 5 MG TABS   2   No  current facility-administered medications for this visit.     Allergies  Allergen Reactions  . Crestor [Rosuvastatin Calcium] Other (See Comments)    Difficulty swallowing  . Lipitor [Atorvastatin Calcium] Other (See Comments)    Muscle spasms  . Lisinopril Cough  . Tricor [Fenofibrate]     Social History   Social History  . Marital status: Married    Spouse name: N/A  . Number of children: N/A  . Years of education: N/A   Occupational History  . Not on file.   Social History Main Topics  . Smoking status: Former Smoker    Packs/day: 3.00    Years: 30.00     Types: Cigarettes    Quit date: 01/29/2001  . Smokeless tobacco: Never Used  . Alcohol use 4.2 oz/week    7 Cans of beer per week     Comment: daily  . Drug use: No  . Sexual activity: Not on file   Other Topics Concern  . Not on file   Social History Narrative  . No narrative on file     Review of Systems: General: negative for chills, fever, night sweats or weight changes.  Cardiovascular: negative for chest pain, dyspnea on exertion, edema, orthopnea, palpitations, paroxysmal nocturnal dyspnea or shortness of breath Dermatological: negative for rash Respiratory: negative for cough or wheezing Urologic: negative for hematuria Abdominal: negative for nausea, vomiting, diarrhea, bright red blood per rectum, melena, or hematemesis Neurologic: negative for visual changes, syncope, or dizziness All other systems reviewed and are otherwise negative except as noted above.    Blood pressure 92/64, pulse 76, height 5\' 7"  (1.702 m), weight 164 lb (74.4 kg).  General appearance: alert and no distress Neck: no adenopathy, no JVD, supple, symmetrical, trachea midline, thyroid not enlarged, symmetric, no tenderness/mass/nodules and Bilateral carotid bruits Lungs: no adenopathy, no JVD, supple, symmetrical, trachea midline, thyroid not enlarged, symmetric, no tenderness/mass/nodules and Bilateral carotid bruits Heart: regular rate and rhythm, S1, S2 normal, no murmur, click, rub or gallop Extremities: extremities normal, atraumatic, no cyanosis or edema  EKG not performed today  ASSESSMENT AND PLAN:   Coronary artery disease History of CAD status post coronary artery bypass grafting X 5 by Dr. Merilynn Finland 06/16/08. He had a free LIMA to his LAD, sequential vein to the first diagonal branch, first and second obtuse marginal branches as well as a vein to the PDA. Since I saw him 03/14/16 she had a Myoview stress test that was read as intermediate risk the large lateral wall perfusion  abnormality. Based on this, I have suggested that we proceed with outpatient diagnostic coronary angiography. I will image his right subclavian artery as well during the procedure given its high degree of stenosis.      [name] MD FACP,FACC,FAHA, FSCAI 05/01/2016 3:00 PM

## 2016-05-14 NOTE — Consult Note (Signed)
Reason for Consult:3 vessel CAD, exertional angina Referring Physician: Dr. Gwenlyn Found, Dr. Jearld Adjutant is an 65 y.o. male.  HPI: 65 year old man with a history of known coronary disease status post coronary bypass grafting 5 back in 2009. He now presents with a chief complaint of exertional chest pain.  Mr. Hollenshead is a 65 year old man with a complicated medical history and known atherosclerotic cardiovascular disease. He also has a history of major depression, hypertension, hyperlipidemia, hypothyroidism, known extracranial carotid occlusive disease, squamous cell cancer of the tongue, and COPD. He had coronary bypass grafting in 2009. Postoperatively he had significant difficulties with noncardiac issues. His primary difficulty was with swallowing. 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  He recently has been experiencing exertional chest pain. He says that he can walk up a flight of stairs without chest discomfort but could not do 2 flights. He notices it when he is walking up an incline. He does not have it with routine activities of daily living. He has not had any rest or nocturnal pain.  Today he underwent cardiac catheterization. He has severe native three-vessel disease. A free left mammary to the LAD is patent with good flow. A sequential vein graft to the first diagonal and obtuse marginals 1 and 2 is occluded proximally with the segment between OM1 and 2 patent. Vein graft to the right coronary is occluded. He has preserved left ventricular function. Currently is pain-free.  Past Medical History:  Diagnosis Date  . CAD (coronary artery disease)   . Carotid artery stenosis   . COPD (chronic obstructive pulmonary disease) ([place]) 12/11/2011   r/s mv - EF 72%; exercise capacity 13 METS; no exercised induced ischemic EKG changes  . Depression, major ([place])   . HTN (hypertension), benign 04/16/2008   echo - EF >55%; no regional wall or valvular abnormalities  .  Hyperlipidemia    statin intolerant  . Hypothyroidism   . Squamous cell cancer of tongue ([place])   . Subclavian steal syndrome 06/17/2012   carotid doppler - R systolic brachial pressure 129mmHg, L 185mmHg; R subclavian artery - proxmial obsstruction w/ abnormal monophasic waveforms, R ECA known occlusive disease; L ECA narrowing w/ 70-99% diameter reductiona    Past Surgical History:  Procedure Laterality Date  . CARDIAC CATHETERIZATION  04/26/2008   L circumflex 99% stenosed midportion straddling a marginal branch; RCA total priximally w/ grade 2 L to R collaterals; renal arteries widely patent; LAD 30% segmental proximal stenosis  . CARDIAC CATHETERIZATION N/A 05/14/2016   Procedure: Left Heart Cath and Cors/Grafts Angiography;  Surgeon: [name], MD;  Location: [facility];  Service: Cardiovascular;  Laterality: N/A;  . CORONARY ARTERY BYPASS GRAFT  2009   x5; LIMA to LAd, sequential vein to first diagonal branch, first and second obtuse marginal branches; vein to PDA  . SHOULDER ARTHROSCOPY WITH SUBACROMIAL DECOMPRESSION AND OPEN ROTATOR C Left 09/28/2013   Procedure: LEFT SHOULDER ARTHROSCOPY WITH SUBACROMIAL DECOMPRESSION AND MINI OPEN ROTATOR CUFF REPAIR, OPEN DISTAL CLAVICLE RESECTION AND OPEN BICEP TENDODESIS ;  Surgeon: Augustin Schooling, MD;  Location: [facility];  Service: Orthopedics;  Laterality: Left;  . SPINE SURGERY  2000   c-spine disc/[place]  . unknown      Family History  Problem Relation Age of Onset  . Cancer Mother     ovarian  . Stroke Father   . Diabetes Father   . Arthritis Father   . Hypertension Brother  Social History:  reports that he quit smoking about 15 years ago. His smoking use included Cigarettes. He has a 90.00 pack-year smoking history. He has never used smokeless tobacco. He reports that he drinks about 4.2 oz of alcohol per week . He reports that he does not use drugs.  Allergies:  Allergies  Allergen Reactions  . Crestor  [Rosuvastatin Calcium] Other (See Comments)    Difficulty swallowing  . Lipitor [Atorvastatin Calcium] Other (See Comments)    Muscle spasms  . Lisinopril Cough  . Tricor [Fenofibrate]     Medications:  Prior to Admission:  Prescriptions Prior to Admission  Medication Sig Dispense Refill Last Dose  . Alirocumab (PRALUENT) 150 MG/ML SOPN Inject 150 mg into the skin every 14 (fourteen) days. 2 pen 11 05/03/2016  . amLODipine (NORVASC) 10 MG tablet TAKE 1 TABLET (10 MG TOTAL) BY MOUTH DAILY. 90 tablet 0 05/13/2016 at Unknown time  . amphetamine-dextroamphetamine (ADDERALL) 30 MG tablet Take 30 mg by mouth 3 (three) times daily.     05/13/2016 at Unknown time  . aspirin 81 MG tablet Take 81 mg by mouth daily.   Taking  . CIALIS 20 MG tablet TAKE 1 TABLET (20 MG TOTAL) BY MOUTH DAILY AS NEEDED FOR ERECTILE DYSFUNCTION. 18 tablet 3 Past Week at Unknown time  . esomeprazole (NEXIUM) 20 MG capsule Take 20 mg by mouth daily at 12 noon.   05/14/2016 at Unknown time  . hydrochlorothiazide (MICROZIDE) 12.5 MG capsule Take 1 capsule (12.5 mg total) by mouth daily. 90 capsule 1 05/13/2016 at Unknown time  . ibuprofen (ADVIL,MOTRIN) 200 MG tablet Take 200 mg by mouth every 8 (eight) hours as needed for moderate pain.    Past Week at Unknown time  . levothyroxine (SYNTHROID, LEVOTHROID) 75 MCG tablet TAKE 1 TABLET (75 MCG TOTAL) BY MOUTH DAILY. 90 tablet 1 05/13/2016 at Unknown time  . LORazepam (ATIVAN) 1 MG tablet Take 1 mg by mouth every 8 (eight) hours as needed for anxiety.    05/14/2016 at Unknown time  . losartan (COZAAR) 50 MG tablet Take 1 tablet by mouth daily.  8 05/14/2016 at Unknown time  . metoprolol succinate (TOPROL-XL) 25 MG 24 hr tablet TAKE 1 TABLET (25 MG TOTAL) BY MOUTH DAILY. 90 tablet 3 05/13/2016 at Unknown time  . traMADol (ULTRAM) 50 MG tablet Take 50 mg by mouth every 4 (four) hours as needed for moderate pain.   0 05/13/2016 at Unknown time  . TRINTELLIX 5 MG TABS Take 1 tablet by mouth  daily.   2 05/14/2016 at Unknown time  . omega-3 acid ethyl esters (LOVAZA) 1 G capsule Take 2 capsules (2 g total) by mouth 2 (two) times daily. 120 capsule 5 More than a month at Unknown time    No results found for this or any previous visit (from the past 48 hour(s)).  No results found.  Review of Systems  Constitutional: Positive for malaise/fatigue. Negative for chills and fever.  HENT:       Left Horner's Trouble swallowing  Eyes: Negative for blurred vision and double vision.  Respiratory: Positive for shortness of breath. Negative for wheezing.   Cardiovascular: Positive for chest pain.  Gastrointestinal: Positive for heartburn. Negative for blood in stool.  Genitourinary: Negative for dysuria and frequency.  Musculoskeletal: Positive for back pain and neck pain.  Neurological: Positive for focal weakness (Left ptosis). Negative for dizziness and loss of consciousness.  Psychiatric/Behavioral: Positive for depression. The patient is nervous/anxious.  Blood pressure (!) 153/78, pulse 81, temperature 98 F (36.7 C), temperature source Oral, resp. rate 17, height 5\' 7"  (1.702 m), weight 160 lb (72.6 kg), SpO2 96 %. Physical Exam  Vitals reviewed. Constitutional: He is oriented to person, place, and time. He appears well-developed and well-nourished. No distress.  HENT:  Head: Normocephalic and atraumatic.  Mouth/Throat: No oropharyngeal exudate.  Eyes: Conjunctivae and EOM are normal. Pupils are equal, round, and reactive to light. No scleral icterus.  Neck: Neck supple. No thyromegaly present.  Bilateral carotid bruits  Cardiovascular: Normal rate, regular rhythm and normal heart sounds.   Respiratory: Effort normal and breath sounds normal. No respiratory distress. He has no wheezes. He has no rales.  Well-healed sternotomy scar, wires palpable  GI: Soft. He exhibits no distension. There is no tenderness. There is no rebound.  Musculoskeletal: Normal range of motion. He  exhibits no edema or deformity.  Lymphadenopathy:    He has no cervical adenopathy.  Neurological: He is alert and oriented to person, place, and time. No cranial nerve deficit. He exhibits normal muscle tone.  Skin: Skin is warm and dry.   Cardiac catheterization- Dr. Gwenlyn Found Conclusion     Ost RCA to Prox RCA lesion, 100 %stenosed.  Ost 1st Diag to 1st Diag lesion, 100 %stenosed.  Mid Cx to Dist Cx lesion, 100 %stenosed.  Ost 2nd Mrg to 2nd Mrg lesion, 90 %stenosed.  Prox Cx to Mid Cx lesion, 70 %stenosed.  Origin to Prox Graft lesion, 100 %stenosed.  Origin to Dist Graft lesion, 100 %stenosed.  LIMA.  The left ventricular systolic function is normal.  LV end diastolic pressure is normal.  The left ventricular ejection fraction is 55-65% by visual estimate.   I personally reviewed the catheterization films and concur with findings as noted above.  Assessment/Plan: 65 year old man with known coronary artery disease and previous coronary bypass grafting 5 back in 2009. He now has exertional angina. He has severe native three-vessel disease and only one patent bypass graft being a free left internal mammary artery to the LAD. Vein graft to the right and the sequential vein graft to the left side are occluded. There is a segment of the sequential vein between OM1 and OM 2 that is patent.  This is a difficult situation due to his, complicated course after his previous bypass grafting. He did extremely well from a cardiac standpoint, but had a multitude of other issues some of which persist to this day. He continues to have issues with swallowing.  I do think redo bypass grafting would be a reasonable consideration. We could probably use his right mammary as a free graft and then possibly is a left radial although his Allen's test was equivocal and he would have to have the vascular lab assessment of his palmar arch. If not we might be able use the left saphenous vein, although he  would need vein mapping first. We did look at the vein on the left during his first operation but it appeared too small to use for bypass. It does appear that stenting his circumflex could establish reasonable flow to OM1 and 2 and he appears to have good collaterals to his right coronary. Therefore, stenting of his circumflex might provide adequate pain relief. It would not preclude the possibility of redo bypass grafting in the future.  Taking all that into consideration as well as his preferences, I think stenting of the circumflex may be the best option for him at this time.  Discussed with Dr. Gwenlyn Found.  Melrose Nakayama 05/14/2016, 4:05 PM

## 2016-05-14 NOTE — Interval H&P Note (Signed)
Cath Lab Visit (complete for each Cath Lab visit)  Clinical Evaluation Leading to the Procedure:   ACS: No.  Non-ACS:    Anginal Classification: CCS III  Anti-ischemic medical therapy: Maximal Therapy (2 or more classes of medications)  Non-Invasive Test Results: Intermediate-risk stress test findings: cardiac mortality 1-3%/year  Prior CABG: Previous CABG      History and Physical Interval Note:  05/14/2016 1:25 PM  [name]  has presented today for surgery, with the diagnosis of abnormal stress test  The various methods of treatment have been discussed with the patient and family. After consideration of risks, benefits and other options for treatment, the patient has consented to  Procedure(s): Left Heart Cath and Cors/Grafts Angiography (N/A) as a surgical intervention .  The patient's history has been reviewed, patient examined, no change in status, stable for surgery.  I have reviewed the patient's chart and labs.  Questions were answered to the patient's satisfaction.     Quay Burow

## 2016-05-15 ENCOUNTER — Telehealth: Payer: Self-pay | Admitting: *Deleted

## 2016-05-15 DIAGNOSIS — Z01818 Encounter for other preprocedural examination: Secondary | ICD-10-CM

## 2016-05-15 DIAGNOSIS — D689 Coagulation defect, unspecified: Secondary | ICD-10-CM

## 2016-05-15 DIAGNOSIS — R9439 Abnormal result of other cardiovascular function study: Secondary | ICD-10-CM

## 2016-05-15 DIAGNOSIS — Z79899 Other long term (current) drug therapy: Secondary | ICD-10-CM

## 2016-05-15 NOTE — Addendum Note (Signed)
Addended by: Vanessa Ralphs on: 05/15/2016 03:13 PM   Modules accepted: Orders

## 2016-05-15 NOTE — Telephone Encounter (Signed)
Called patient back and gave him date and time of procedure and to arrive on 05/28/16 at 1100, not to eat or drink past midnight, do not take his hydrochlorothiazide the morning of the procedure. Patient verbalized understanding.  Orders entered for pt to get lab work within 14 days of the procedure. He verbalized understanding and knows where the lab is.  Patient is aware of his appt on 05/18/16 with Dr Gwenlyn Found.

## 2016-05-15 NOTE — Telephone Encounter (Signed)
Dr Gwenlyn Found informed me today that patient needed to be scheduled for left heart catheterization with coronary circumflex intervention on 05/21/16.   Attempted to call patient. Left message to call back.

## 2016-05-15 NOTE — Telephone Encounter (Signed)
Received incoming call from patient. We discussed date that would accommodate pt for the procedure. Patient could not do procedure on 05/21/16 and chose to have on 05/28/16.  Cath lab called and procedure scheduled for 05/28/16 at 1300 with pt to arrive at 1100.

## 2016-05-16 ENCOUNTER — Other Ambulatory Visit: Payer: Self-pay | Admitting: *Deleted

## 2016-05-16 DIAGNOSIS — R9439 Abnormal result of other cardiovascular function study: Secondary | ICD-10-CM

## 2016-05-18 ENCOUNTER — Encounter: Payer: Self-pay | Admitting: Cardiovascular Disease

## 2016-05-18 ENCOUNTER — Ambulatory Visit (INDEPENDENT_AMBULATORY_CARE_PROVIDER_SITE_OTHER): Payer: 59 | Admitting: Cardiovascular Disease

## 2016-05-18 DIAGNOSIS — R9439 Abnormal result of other cardiovascular function study: Secondary | ICD-10-CM | POA: Diagnosis not present

## 2016-05-18 DIAGNOSIS — I2583 Coronary atherosclerosis due to lipid rich plaque: Principal | ICD-10-CM

## 2016-05-18 DIAGNOSIS — D689 Coagulation defect, unspecified: Secondary | ICD-10-CM | POA: Diagnosis not present

## 2016-05-18 DIAGNOSIS — I2510 Atherosclerotic heart disease of native coronary artery without angina pectoris: Secondary | ICD-10-CM

## 2016-05-18 DIAGNOSIS — Z79899 Other long term (current) drug therapy: Secondary | ICD-10-CM | POA: Diagnosis not present

## 2016-05-18 DIAGNOSIS — Z01818 Encounter for other preprocedural examination: Secondary | ICD-10-CM | POA: Diagnosis not present

## 2016-05-18 LAB — CBC WITH DIFFERENTIAL/PLATELET
Basophils Absolute: 44 cells/uL (ref 0–200)
Basophils Relative: 1 %
EOS ABS: 176 {cells}/uL (ref 15–500)
Eosinophils Relative: 4 %
HEMATOCRIT: 43 % (ref 38.5–50.0)
HEMOGLOBIN: 14.5 g/dL (ref 13.2–17.1)
LYMPHS PCT: 17 %
Lymphs Abs: 748 cells/uL — ABNORMAL LOW (ref 850–3900)
MCH: 29.1 pg (ref 27.0–33.0)
MCHC: 33.7 g/dL (ref 32.0–36.0)
MCV: 86.2 fL (ref 80.0–100.0)
MONO ABS: 572 {cells}/uL (ref 200–950)
MPV: 8.9 fL (ref 7.5–12.5)
Monocytes Relative: 13 %
NEUTROS PCT: 65 %
Neutro Abs: 2860 cells/uL (ref 1500–7800)
Platelets: 182 10*3/uL (ref 140–400)
RBC: 4.99 MIL/uL (ref 4.20–5.80)
RDW: 14.4 % (ref 11.0–15.0)
WBC: 4.4 10*3/uL (ref 3.8–10.8)

## 2016-05-18 LAB — BASIC METABOLIC PANEL
BUN: 18 mg/dL (ref 7–25)
CHLORIDE: 105 mmol/L (ref 98–110)
CO2: 28 mmol/L (ref 20–31)
CREATININE: 1.26 mg/dL — AB (ref 0.70–1.25)
Calcium: 9.4 mg/dL (ref 8.6–10.3)
GLUCOSE: 114 mg/dL — AB (ref 65–99)
Potassium: 4.1 mmol/L (ref 3.5–5.3)
Sodium: 141 mmol/L (ref 135–146)

## 2016-05-18 MED ORDER — CLOPIDOGREL BISULFATE 75 MG PO TABS: 75.0000 mg | ORAL_TABLET | Freq: Every day | ORAL | 3 refills | Status: DC

## 2016-05-18 MED FILL — CLOPIDOGREL 75 MG TABLET: 75 | 90 days supply | Qty: 90 | Fill #0

## 2016-05-18 NOTE — Progress Notes (Signed)
05/18/2016 [name]   11-Sep-1951  [id number]  Primary Physician [name], MD Primary Cardiologist: [name] MD [name], [place]  HPI:  The patient is a very pleasant 65 year old, thin-appearing, married Caucasian male, father of 48, grandfather to 4 grandchildren who I last saw 03/14/16. He has a history of coronary artery disease status post bypass grafting x5 by Dr. Merilynn Finland June 16, 2008, with a free LIMA to his LAD, sequential vein to the first diagonal branch, first and second obtuse marginal branches, as well as a vein to the PDA. His other problems include hypertension and hyperlipidemia. He denies chest pain or shortness of breath. I did perform cerebral angiography on him May 05, 2008, because of a question of a high-grade internal carotid artery stenosis read by the radiologist. However, this turned out to be a left external carotid artery stenosis. He does have high-grade ostial left internal mammary artery stenosis which necessitated the use of a free LIMA. He is statin intolerant. 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 A Myoview stress test performed December 11, 2011, was normal and carotid Dopplers did show a moderately severe right ICA stenosis which we have been following by duplex ultrasound. 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 Since I saw him 12 months ago he has noticed increasing dyspnea on exertion over the last several months, especially noticeable while mowing his lawn. I obtained a Myoview stress test on him based on this 03/29/16 that showed a large reversible lateral wall perfusion defect  consistent with ischemia. He underwent outpatient cardiac catheterization by myself on 05/14/16 revealing severe  native and graft vessel disease with normal LV function. Specifically, his RCA vein graft was occluded to an occluded dominant right and his circumflex vein graft which was sequential to 2 obtuse marginal branches was occluded at the aorta to patent continuation between OM1 and 2. He did have native circumflex disease. Dr. Roxan Hockey saw him during his brief hospitalization to discuss the possibility of redo coronary artery bypass grafting her consensus was that he would be better served with attempted PCI and stenting of his native circumflex coronary artery.  Current Outpatient Prescriptions  Medication Sig Dispense Refill  . Alirocumab (PRALUENT) 150 MG/ML SOPN Inject 150 mg into the skin every 14 (fourteen) days. 2 pen 11  . amLODipine (NORVASC) 10 MG tablet TAKE 1 TABLET (10 MG TOTAL) BY MOUTH DAILY. 90 tablet 0  . amphetamine-dextroamphetamine (ADDERALL) 30 MG tablet Take 30 mg by mouth 3 (three) times daily.      Marland Kitchen aspirin 81 MG tablet Take 81 mg by mouth daily.    Marland Kitchen CIALIS 20 MG tablet TAKE 1 TABLET (20 MG TOTAL) BY MOUTH DAILY AS NEEDED FOR ERECTILE DYSFUNCTION. 18 tablet 3  . esomeprazole (NEXIUM) 20 MG capsule Take 20 mg by mouth daily at 12 noon.    . hydrochlorothiazide (MICROZIDE) 12.5 MG capsule Take 1 capsule (12.5 mg total) by mouth daily. 90 capsule 1  . ibuprofen (ADVIL,MOTRIN) 200 MG tablet Take 200 mg by mouth every 8 (eight) hours as needed for moderate pain.     Marland Kitchen levothyroxine (SYNTHROID, LEVOTHROID) 75 MCG tablet TAKE 1 TABLET (75 MCG TOTAL) BY MOUTH DAILY. 90 tablet 1  . LORazepam (ATIVAN)  1 MG tablet Take 1 mg by mouth every 8 (eight) hours as needed for anxiety.     Marland Kitchen losartan (COZAAR) 50 MG tablet Take 1 tablet by mouth daily.  8  . metoprolol succinate (TOPROL-XL) 25 MG 24 hr tablet TAKE 1 TABLET (25 MG TOTAL) BY MOUTH DAILY. 90 tablet 3  . omega-3 acid ethyl esters (LOVAZA) 1 G capsule Take 2 capsules (2 g total) by mouth 2 (two) times daily. 120 capsule 5  . traMADol  (ULTRAM) 50 MG tablet Take 50 mg by mouth every 4 (four) hours as needed for moderate pain.   0  . TRINTELLIX 5 MG TABS Take 1 tablet by mouth daily.   2  . clopidogrel (PLAVIX) 75 MG tablet Take 1 tablet (75 mg total) by mouth daily. 90 tablet 3   No current facility-administered medications for this visit.     Allergies  Allergen Reactions  . Crestor [Rosuvastatin Calcium] Other (See Comments)    Difficulty swallowing  . Lipitor [Atorvastatin Calcium] Other (See Comments)    Muscle spasms  . Lisinopril Cough  . Tricor [Fenofibrate]     Social History   Social History  . Marital status: Married    Spouse name: N/A  . Number of children: N/A  . Years of education: N/A   Occupational History  . Not on file.   Social History Main Topics  . Smoking status: Former Smoker    Packs/day: 3.00    Years: 30.00    Types: Cigarettes    Quit date: 01/29/2001  . Smokeless tobacco: Never Used  . Alcohol use 4.2 oz/week    7 Cans of beer per week     Comment: daily  . Drug use: No  . Sexual activity: Not on file   Other Topics Concern  . Not on file   Social History Narrative  . No narrative on file     Review of Systems: General: negative for chills, fever, night sweats or weight changes.  Cardiovascular: negative for chest pain, dyspnea on exertion, edema, orthopnea, palpitations, paroxysmal nocturnal dyspnea or shortness of breath Dermatological: negative for rash Respiratory: negative for cough or wheezing Urologic: negative for hematuria Abdominal: negative for nausea, vomiting, diarrhea, bright red blood per rectum, melena, or hematemesis Neurologic: negative for visual changes, syncope, or dizziness All other systems reviewed and are otherwise negative except as noted above.    Blood pressure 108/77, pulse 86, height 5\' 7"  (1.702 m), weight 163 lb (73.9 kg).  General appearance: alert and no distress Neck: no adenopathy, no carotid bruit, no JVD, supple,  symmetrical, trachea midline and thyroid not enlarged, symmetric, no tenderness/mass/nodules Lungs: clear to auscultation bilaterally Heart: regular rate and rhythm, S1, S2 normal, no murmur, click, rub or gallop Extremities: extremities normal, atraumatic, no cyanosis or edema and Moderate ecchymosis and resolving hematoma right groin  EKG performed today  ASSESSMENT AND PLAN:   Coronary artery disease Mr. Mohney returns today after his recent cardiac catheterization which I performed as an outpatient on 05/14/16 because of progressive dyspnea. He has had coronary artery bypass grafting 06/16/08. His cath revealed a patent LIMA to the LAD, and occluded vein to the distal right with occluded native right and an occluded vein from the aorta down to the first marginal branch with patent continuation to the second marginal branch. He does have AV groove circumflex and proximal marginal branch stenosis. His EF is normal. I had Dr. Gorden Harms and review his angiogram and discussed possible options  with the patient and his wife. The consensus is that he is not an optimal patient for redo bypass surgery and therefore we will pursue PCI stenting of the circumflex coronary artery.      [name] MD FACP,FACC,FAHA, [facility] 05/18/2016 1:54 PM

## 2016-05-18 NOTE — Assessment & Plan Note (Signed)
Mr. [name] returns today after his recent cardiac catheterization which I performed as an outpatient on 05/14/16 because of progressive dyspnea. He has had coronary artery bypass grafting 06/16/08. His cath revealed a patent LIMA to the LAD, and occluded vein to the distal right with occluded native right and an occluded vein from the aorta down to the first marginal branch with patent continuation to the second marginal branch. He does have AV groove circumflex and proximal marginal branch stenosis. His EF is normal. I had Dr. Gorden [name] and review his angiogram and discussed possible options with the patient and his wife. The consensus is that he is not an optimal patient for redo bypass surgery and therefore we will pursue PCI stenting of the circumflex coronary artery.

## 2016-05-18 NOTE — Patient Instructions (Signed)
Medication Instructions:  Your physician has recommended you make the following change in your medication:  1- START TAKING Plavix 75 mg (1 tablet) by mouth once a day.   Testing/Procedures:  YOU ARE ALREADY SCHEDULED FOR CORONARY INTERVENTION TO YOUR HEART ON 05/28/16 AT 1 P.M. PLEASE ARRIVE TO THE DESIGNATED AREA AT 1100 AM ON THAT DAY AS YOU DID FOR YOUR PREVIOUS PROCEDURE.  Following your catheterization/intervention procedure, you will not be allowed to drive for 3 days.  No lifting, pushing, or pulling greater that 10 pounds is allowed for 1 week.  You will be required to have the following tests prior to the procedure:  1. Blood work-the blood work can be done no more than 14 days prior to the procedure.  It can be done at any [facility] lab.  There is one downstairs on the first floor of this building and one in the [facility] building [phone]).  Puncture site    If you need a refill on your cardiac medications before your next appointment, please call your pharmacy.

## 2016-05-19 LAB — PROTIME-INR
INR: 1
PROTHROMBIN TIME: 10.3 s (ref 9.0–11.5)

## 2016-05-19 LAB — APTT: aPTT: 28 s (ref 22–34)

## 2016-05-22 ENCOUNTER — Other Ambulatory Visit: Payer: Self-pay | Admitting: Internal Medicine

## 2016-05-22 MED ORDER — AMLODIPINE BESYLATE 10 MG PO TABS: 10.0000 mg | ORAL_TABLET | Freq: Every day | ORAL | 0 refills | Status: DC

## 2016-05-22 MED FILL — AMLODIPINE BESYLATE 10 MG T: 10 | 90 days supply | Qty: 90 | Fill #0

## 2016-05-23 ENCOUNTER — Telehealth: Payer: Self-pay | Admitting: *Deleted

## 2016-05-23 DIAGNOSIS — I2510 Atherosclerotic heart disease of native coronary artery without angina pectoris: Secondary | ICD-10-CM

## 2016-05-23 DIAGNOSIS — D689 Coagulation defect, unspecified: Secondary | ICD-10-CM

## 2016-05-23 DIAGNOSIS — Z79899 Other long term (current) drug therapy: Secondary | ICD-10-CM

## 2016-05-23 DIAGNOSIS — Z01818 Encounter for other preprocedural examination: Secondary | ICD-10-CM

## 2016-05-23 DIAGNOSIS — I2583 Coronary atherosclerosis due to lipid rich plaque: Secondary | ICD-10-CM

## 2016-05-23 NOTE — Telephone Encounter (Signed)
No answer. Left message to call back.  Need to inform patient that his cardiac procedure needs to be rescheduled related to scheduling conflicts. Will try again later.

## 2016-05-24 NOTE — Telephone Encounter (Signed)
Called patient.  Notified him that due to scheduling conflicts, we needed to reschedule his procedure for 06/14/16 at 0730 and to arrive at [facility] at [place] am.  He verbalized understanding of time change and to get repeat lab work within 14 days of the procedure. New lab orders entered. Patient was very understanding of the need to reschedule the procedure.

## 2016-05-25 MED FILL — DEXTROAMP-AMP 30 MG TABLET: 30 | 90 days supply | Qty: 270 | Fill #0

## 2016-05-29 MED FILL — PRALUENT 150 MG/ML PEN: 150 | 30 days supply | Qty: 2 | Fill #11

## 2016-05-31 MED FILL — CIALIS 20 MG TABLET: 20 | 30 days supply | Qty: 6 | Fill #7

## 2016-05-31 MED FILL — LEVOTHYROXINE 75 MCG TABLET: 75 | 90 days supply | Qty: 90 | Fill #1

## 2016-06-08 DIAGNOSIS — Z01818 Encounter for other preprocedural examination: Secondary | ICD-10-CM | POA: Diagnosis not present

## 2016-06-08 DIAGNOSIS — Z79899 Other long term (current) drug therapy: Secondary | ICD-10-CM | POA: Diagnosis not present

## 2016-06-08 DIAGNOSIS — D689 Coagulation defect, unspecified: Secondary | ICD-10-CM | POA: Diagnosis not present

## 2016-06-08 DIAGNOSIS — I2510 Atherosclerotic heart disease of native coronary artery without angina pectoris: Secondary | ICD-10-CM | POA: Diagnosis not present

## 2016-06-09 LAB — CBC WITH DIFFERENTIAL/PLATELET
Basophils Absolute: 48 cells/uL (ref 0–200)
Basophils Relative: 1 %
EOS ABS: 144 {cells}/uL (ref 15–500)
EOS PCT: 3 %
HCT: 42.1 % (ref 38.5–50.0)
Hemoglobin: 13.9 g/dL (ref 13.2–17.1)
LYMPHS ABS: 672 {cells}/uL — AB (ref 850–3900)
Lymphocytes Relative: 14 %
MCH: 28.8 pg (ref 27.0–33.0)
MCHC: 33 g/dL (ref 32.0–36.0)
MCV: 87.3 fL (ref 80.0–100.0)
MONO ABS: 384 {cells}/uL (ref 200–950)
MONOS PCT: 8 %
MPV: 9.3 fL (ref 7.5–12.5)
NEUTROS PCT: 74 %
Neutro Abs: 3552 cells/uL (ref 1500–7800)
PLATELETS: 166 10*3/uL (ref 140–400)
RBC: 4.82 MIL/uL (ref 4.20–5.80)
RDW: 14.4 % (ref 11.0–15.0)
WBC: 4.8 10*3/uL (ref 3.8–10.8)

## 2016-06-09 LAB — BASIC METABOLIC PANEL
BUN: 18 mg/dL (ref 7–25)
CALCIUM: 9.1 mg/dL (ref 8.6–10.3)
CO2: 27 mmol/L (ref 20–31)
Chloride: 105 mmol/L (ref 98–110)
Creat: 1.51 mg/dL — ABNORMAL HIGH (ref 0.70–1.25)
Glucose, Bld: 132 mg/dL — ABNORMAL HIGH (ref 65–99)
POTASSIUM: 4.6 mmol/L (ref 3.5–5.3)
SODIUM: 141 mmol/L (ref 135–146)

## 2016-06-09 LAB — PROTIME-INR
INR: 0.9
PROTHROMBIN TIME: 10.1 s (ref 9.0–11.5)

## 2016-06-09 LAB — APTT: APTT: 26 s (ref 22–34)

## 2016-06-12 ENCOUNTER — Encounter: Payer: Self-pay | Admitting: Cardiovascular Disease

## 2016-06-12 DIAGNOSIS — E785 Hyperlipidemia, unspecified: Secondary | ICD-10-CM

## 2016-06-13 DIAGNOSIS — Z760 Encounter for issue of repeat prescription: Secondary | ICD-10-CM | POA: Diagnosis not present

## 2016-06-14 ENCOUNTER — Encounter (HOSPITAL_COMMUNITY): Payer: Self-pay | Admitting: Cardiovascular Disease

## 2016-06-14 ENCOUNTER — Encounter (HOSPITAL_COMMUNITY)
Admission: RE | Disposition: A | Discharge status: Home / Self Care | Payer: Self-pay | Source: Ambulatory Visit | Attending: Cardiovascular Disease

## 2016-06-14 ENCOUNTER — Ambulatory Visit (HOSPITAL_COMMUNITY)
Admission: RE | Admit: 2016-06-14 | Discharge: 2016-06-15 | Disposition: A | Discharge status: Home / Self Care | Payer: 59 | Source: Ambulatory Visit | Attending: Cardiovascular Disease | Admitting: Cardiovascular Disease

## 2016-06-14 DIAGNOSIS — Z7902 Long term (current) use of antithrombotics/antiplatelets: Secondary | ICD-10-CM | POA: Diagnosis not present

## 2016-06-14 DIAGNOSIS — I2510 Atherosclerotic heart disease of native coronary artery without angina pectoris: Secondary | ICD-10-CM | POA: Diagnosis not present

## 2016-06-14 DIAGNOSIS — I2582 Chronic total occlusion of coronary artery: Secondary | ICD-10-CM | POA: Diagnosis not present

## 2016-06-14 DIAGNOSIS — E785 Hyperlipidemia, unspecified: Secondary | ICD-10-CM | POA: Diagnosis not present

## 2016-06-14 DIAGNOSIS — Z955 Presence of coronary angioplasty implant and graft: Secondary | ICD-10-CM

## 2016-06-14 DIAGNOSIS — Z87891 Personal history of nicotine dependence: Secondary | ICD-10-CM | POA: Diagnosis not present

## 2016-06-14 DIAGNOSIS — Z23 Encounter for immunization: Secondary | ICD-10-CM | POA: Diagnosis not present

## 2016-06-14 DIAGNOSIS — R9439 Abnormal result of other cardiovascular function study: Secondary | ICD-10-CM | POA: Diagnosis present

## 2016-06-14 DIAGNOSIS — Z95820 Peripheral vascular angioplasty status with implants and grafts: Secondary | ICD-10-CM

## 2016-06-14 DIAGNOSIS — I10 Essential (primary) hypertension: Secondary | ICD-10-CM | POA: Insufficient documentation

## 2016-06-14 DIAGNOSIS — K219 Gastro-esophageal reflux disease without esophagitis: Secondary | ICD-10-CM | POA: Diagnosis not present

## 2016-06-14 DIAGNOSIS — I6522 Occlusion and stenosis of left carotid artery: Secondary | ICD-10-CM | POA: Insufficient documentation

## 2016-06-14 DIAGNOSIS — Z7982 Long term (current) use of aspirin: Secondary | ICD-10-CM | POA: Insufficient documentation

## 2016-06-14 DIAGNOSIS — I2583 Coronary atherosclerosis due to lipid rich plaque: Secondary | ICD-10-CM | POA: Insufficient documentation

## 2016-06-14 DIAGNOSIS — N529 Male erectile dysfunction, unspecified: Secondary | ICD-10-CM | POA: Diagnosis not present

## 2016-06-14 DIAGNOSIS — Z951 Presence of aortocoronary bypass graft: Secondary | ICD-10-CM | POA: Insufficient documentation

## 2016-06-14 HISTORY — DX: Transient cerebral ischemic attack, unspecified: G45.9

## 2016-06-14 HISTORY — PX: CARDIAC CATHETERIZATION: SHX172

## 2016-06-14 HISTORY — DX: Gastro-esophageal reflux disease without esophagitis: K21.9

## 2016-06-14 HISTORY — DX: Peripheral vascular angioplasty status with implants and grafts: Z95.820

## 2016-06-14 HISTORY — DX: Unspecified viral hepatitis without hepatic coma: B19.9

## 2016-06-14 HISTORY — DX: Anxiety disorder, unspecified: F41.9

## 2016-06-14 HISTORY — DX: Unspecified osteoarthritis, unspecified site: M19.90

## 2016-06-14 HISTORY — DX: Migraine, unspecified, not intractable, without status migrainosus: G43.909

## 2016-06-14 LAB — LIPID PANEL
Cholesterol: 126 mg/dL (ref 0–200)
HDL: 63 mg/dL (ref >40)
LDL CALC: 30 mg/dL (ref 0–99)
Total CHOL/HDL Ratio: 2 RATIO
Triglycerides: 164 mg/dL — ABNORMAL HIGH (ref <150)
VLDL: 33 mg/dL (ref 0–40)

## 2016-06-14 LAB — BASIC METABOLIC PANEL
Anion gap: 9 (ref 5–15)
BUN: 13 mg/dL (ref 6–20)
CALCIUM: 8.7 mg/dL — AB (ref 8.9–10.3)
CO2: 25 mmol/L (ref 22–32)
Chloride: 106 mmol/L (ref 101–111)
Creatinine, Ser: 1.04 mg/dL (ref 0.61–1.24)
GFR calc Af Amer: >60 mL/min (ref >60)
GLUCOSE: 87 mg/dL (ref 65–99)
Potassium: 3.5 mmol/L (ref 3.5–5.1)
Sodium: 140 mmol/L (ref 135–145)

## 2016-06-14 LAB — POCT ACTIVATED CLOTTING TIME: ACTIVATED CLOTTING TIME: 373 s

## 2016-06-14 SURGERY — CORONARY STENT INTERVENTION

## 2016-06-14 MED ORDER — IOPAMIDOL (ISOVUE-370) INJECTION 76%
INTRAVENOUS | Status: AC
  Filled 2016-06-14: qty 125

## 2016-06-14 MED ORDER — LIDOCAINE HCL (PF) 1 % IJ SOLN: INTRAMUSCULAR | Status: DC | PRN

## 2016-06-14 MED ORDER — LIDOCAINE HCL (PF) 1 % IJ SOLN
INTRAMUSCULAR | Status: AC
  Filled 2016-06-14: qty 30

## 2016-06-14 MED ORDER — CLOPIDOGREL BISULFATE 75 MG PO TABS
75.0000 mg | ORAL_TABLET | Freq: Every day | ORAL | Status: DC
  Administered 2016-06-15: 75 mg via ORAL
  Filled 2016-06-14: qty 1

## 2016-06-14 MED ORDER — FENTANYL CITRATE (PF) 100 MCG/2ML IJ SOLN
INTRAMUSCULAR | Status: AC
  Filled 2016-06-14: qty 2

## 2016-06-14 MED ORDER — LEVOTHYROXINE SODIUM 75 MCG PO TABS
75.0000 ug | ORAL_TABLET | Freq: Every day | ORAL | Status: DC
  Administered 2016-06-14: 75 ug via ORAL
  Filled 2016-06-14: qty 1

## 2016-06-14 MED ORDER — ASPIRIN EC 81 MG PO TBEC
81.0000 mg | DELAYED_RELEASE_TABLET | Freq: Every day | ORAL | Status: DC
  Administered 2016-06-15: 81 mg via ORAL
  Filled 2016-06-14: qty 1

## 2016-06-14 MED ORDER — IOPAMIDOL (ISOVUE-370) INJECTION 76%
INTRAVENOUS | Status: DC | PRN
  Administered 2016-06-14: 110 mL via INTRAVENOUS

## 2016-06-14 MED ORDER — MIDAZOLAM HCL 2 MG/2ML IJ SOLN
INTRAMUSCULAR | Status: AC
  Filled 2016-06-14: qty 2

## 2016-06-14 MED ORDER — SODIUM CHLORIDE 0.9 % WEIGHT BASED INFUSION
3.0000 mL/kg/h | INTRAVENOUS | Status: DC
  Administered 2016-06-14: 3 mL/kg/h via INTRAVENOUS

## 2016-06-14 MED ORDER — BIVALIRUDIN BOLUS VIA INFUSION - CUPID
INTRAVENOUS | Status: DC | PRN
  Administered 2016-06-14: 54.45 mg via INTRAVENOUS

## 2016-06-14 MED ORDER — VORTIOXETINE HBR 5 MG PO TABS
5.0000 mg | ORAL_TABLET | Freq: Every day | ORAL | Status: DC
  Administered 2016-06-15: 09:00:00 5 mg via ORAL
  Filled 2016-06-14: qty 1

## 2016-06-14 MED ORDER — ASPIRIN 81 MG PO CHEW: 81.0000 mg | CHEWABLE_TABLET | Freq: Every day | ORAL | Status: DC

## 2016-06-14 MED ORDER — AMLODIPINE BESYLATE 10 MG PO TABS
10.0000 mg | ORAL_TABLET | Freq: Every day | ORAL | Status: DC
  Administered 2016-06-14 – 2016-06-15 (×2): 10 mg via ORAL
  Filled 2016-06-14 (×2): qty 1

## 2016-06-14 MED ORDER — INFLUENZA VAC SPLIT QUAD 0.5 ML IM SUSY
0.5000 mL | PREFILLED_SYRINGE | INTRAMUSCULAR | Status: AC
  Administered 2016-06-15: 09:00:00 0.5 mL via INTRAMUSCULAR
  Filled 2016-06-14: qty 0.5

## 2016-06-14 MED ORDER — HEPARIN (PORCINE) IN NACL 2-0.9 UNIT/ML-% IJ SOLN
INTRAMUSCULAR | Status: DC | PRN
  Administered 2016-06-14: 1500 mL

## 2016-06-14 MED ORDER — LORAZEPAM 0.5 MG PO TABS
1.0000 mg | ORAL_TABLET | Freq: Three times a day (TID) | ORAL | Status: DC | PRN
Start: 2016-06-14 — End: 2016-06-15

## 2016-06-14 MED ORDER — SODIUM CHLORIDE 0.9% FLUSH: 3.0000 mL | INTRAVENOUS | Status: DC | PRN

## 2016-06-14 MED ORDER — CLOPIDOGREL BISULFATE 75 MG PO TABS: 75.0000 mg | ORAL_TABLET | Freq: Every day | ORAL | Status: DC

## 2016-06-14 MED ORDER — ONDANSETRON HCL 4 MG/2ML IJ SOLN: 4.0000 mg | Freq: Four times a day (QID) | INTRAMUSCULAR | Status: DC | PRN

## 2016-06-14 MED ORDER — NITROGLYCERIN 1 MG/10 ML FOR IR/CATH LAB
INTRA_ARTERIAL | Status: AC
  Filled 2016-06-14: qty 10

## 2016-06-14 MED ORDER — HYDROCHLOROTHIAZIDE 12.5 MG PO CAPS
12.5000 mg | ORAL_CAPSULE | Freq: Every day | ORAL | Status: DC
  Administered 2016-06-15: 12.5 mg via ORAL
  Filled 2016-06-14: qty 1

## 2016-06-14 MED ORDER — METOPROLOL SUCCINATE ER 25 MG PO TB24
25.0000 mg | ORAL_TABLET | Freq: Every day | ORAL | Status: DC
  Administered 2016-06-14: 12:00:00 25 mg via ORAL
  Filled 2016-06-14 (×2): qty 1

## 2016-06-14 MED ORDER — SODIUM CHLORIDE 0.9 % WEIGHT BASED INFUSION: 1.0000 mL/kg/h | INTRAVENOUS | Status: DC

## 2016-06-14 MED ORDER — NITROGLYCERIN 1 MG/10 ML FOR IR/CATH LAB
INTRA_ARTERIAL | Status: DC | PRN
  Administered 2016-06-14: 200 ug

## 2016-06-14 MED ORDER — PANTOPRAZOLE SODIUM 40 MG PO TBEC
40.0000 mg | DELAYED_RELEASE_TABLET | Freq: Every day | ORAL | Status: DC
  Administered 2016-06-15: 40 mg via ORAL
  Filled 2016-06-14: qty 1

## 2016-06-14 MED ORDER — FENTANYL CITRATE (PF) 100 MCG/2ML IJ SOLN
INTRAMUSCULAR | Status: DC | PRN
  Administered 2016-06-14 (×2): 25 ug via INTRAVENOUS

## 2016-06-14 MED ORDER — MIDAZOLAM HCL 2 MG/2ML IJ SOLN
INTRAMUSCULAR | Status: DC | PRN
  Administered 2016-06-14 (×2): 1 mg via INTRAVENOUS

## 2016-06-14 MED ORDER — IBUPROFEN 200 MG PO TABS: 200.0000 mg | ORAL_TABLET | Freq: Three times a day (TID) | ORAL | Status: DC | PRN

## 2016-06-14 MED ORDER — LIDOCAINE HCL (PF) 1 % IJ SOLN
INTRAMUSCULAR | Status: DC | PRN
  Administered 2016-06-14: 20 mL

## 2016-06-14 MED ORDER — MORPHINE SULFATE (PF) 2 MG/ML IV SOLN: 2.0000 mg | INTRAVENOUS | Status: DC | PRN

## 2016-06-14 MED ORDER — HYDRALAZINE HCL 20 MG/ML IJ SOLN: 10.0000 mg | INTRAMUSCULAR | Status: DC | PRN

## 2016-06-14 MED ORDER — AMPHETAMINE-DEXTROAMPHETAMINE 10 MG PO TABS
30.0000 mg | ORAL_TABLET | Freq: Three times a day (TID) | ORAL | Status: DC
  Administered 2016-06-14 – 2016-06-15 (×2): 30 mg via ORAL
  Filled 2016-06-14 (×2): qty 3

## 2016-06-14 MED ORDER — TRAMADOL HCL 50 MG PO TABS: 50.0000 mg | ORAL_TABLET | ORAL | Status: DC | PRN

## 2016-06-14 MED ORDER — SODIUM CHLORIDE 0.9% FLUSH
3.0000 mL | Freq: Two times a day (BID) | INTRAVENOUS | Status: DC
  Administered 2016-06-14 – 2016-06-15 (×3): 3 mL via INTRAVENOUS

## 2016-06-14 MED ORDER — BIVALIRUDIN 250 MG IV SOLR
INTRAVENOUS | Status: AC
  Filled 2016-06-14: qty 250

## 2016-06-14 MED ORDER — ALIROCUMAB 150 MG/ML ~~LOC~~ SOPN: 150.0000 mg | PEN_INJECTOR | SUBCUTANEOUS | Status: DC

## 2016-06-14 MED ORDER — HEPARIN (PORCINE) IN NACL 2-0.9 UNIT/ML-% IJ SOLN
INTRAMUSCULAR | Status: AC
  Filled 2016-06-14: qty 1000

## 2016-06-14 MED ORDER — ACETAMINOPHEN 325 MG PO TABS: 650.0000 mg | ORAL_TABLET | ORAL | Status: DC | PRN

## 2016-06-14 MED ORDER — SODIUM CHLORIDE 0.9 % WEIGHT BASED INFUSION
3.0000 mL/kg/h | INTRAVENOUS | Status: AC
  Administered 2016-06-14: 12:00:00 3 mL/kg/h via INTRAVENOUS

## 2016-06-14 MED ORDER — SODIUM CHLORIDE 0.9 % IV SOLN
INTRAVENOUS | Status: DC | PRN
  Administered 2016-06-14: 1.75 mg/kg/h via INTRAVENOUS

## 2016-06-14 MED ORDER — ASPIRIN 81 MG PO CHEW: 81.0000 mg | CHEWABLE_TABLET | ORAL | Status: DC

## 2016-06-14 MED ORDER — SODIUM CHLORIDE 0.9 % IV SOLN: 250.0000 mL | INTRAVENOUS | Status: DC | PRN

## 2016-06-14 MED ORDER — ANGIOPLASTY BOOK
Freq: Once | Status: AC
  Administered 2016-06-14: 11:00:00
  Filled 2016-06-14: qty 1

## 2016-06-14 MED ORDER — LOSARTAN POTASSIUM 50 MG PO TABS
50.0000 mg | ORAL_TABLET | Freq: Every day | ORAL | Status: DC
  Administered 2016-06-15: 50 mg via ORAL
  Filled 2016-06-14: qty 1

## 2016-06-14 SURGICAL SUPPLY — 16 items
BALLN EMERGE MR 2.0X15 (BALLOONS) ×3
BALLN ~~LOC~~ TREK RX 2.5X20 (BALLOONS) ×3
BALLOON EMERGE MR 2.0X15 (BALLOONS) IMPLANT
BALLOON ~~LOC~~ TREK RX 2.5X20 (BALLOONS) IMPLANT
CATH VISTA GUIDE 6FR XB3.5 (CATHETERS) ×2 IMPLANT
DEVICE CONTINUOUS FLUSH (MISCELLANEOUS) ×2 IMPLANT
ELECT DEFIB PAD ADLT CADENCE (PAD) ×2 IMPLANT
KIT ENCORE 26 ADVANTAGE (KITS) ×5 IMPLANT
KIT HEART LEFT (KITS) ×3 IMPLANT
PACK CARDIAC CATHETERIZATION (CUSTOM PROCEDURE TRAY) ×3 IMPLANT
SHEATH PINNACLE 6F 10CM (SHEATH) ×2 IMPLANT
STENT SYNERGY DES 2.25X38 (Permanent Stent) ×2 IMPLANT
TRANSDUCER W/STOPCOCK (MISCELLANEOUS) ×3 IMPLANT
TUBING CIL FLEX 10 FLL-RA (TUBING) ×3 IMPLANT
WIRE ASAHI PROWATER 180CM (WIRE) ×2 IMPLANT
WIRE EMERALD 3MM-J .035X150CM (WIRE) ×2 IMPLANT

## 2016-06-14 NOTE — Care Management Note (Addendum)
Case Management Note  Patient Details  Name: [name] MRN: [id number] Date of Birth: April 06, 1951  Subjective/Objective:  Patient is s/p coronary interventionl, will be on plavix, he lives with wife at home pta indep,  Has pcp, has medication coverage and has transportation at dc.  NCM will cont to follow for dc needs.                   Action/Plan:   Expected Discharge Date:                  Expected Discharge Plan:  Home/Self Care  In-House Referral:     Discharge planning Services  CM Consult  Post Acute Care Choice:    Choice offered to:     DME Arranged:    DME Agency:     HH Arranged:    HH Agency:     Status of Service:  In process, will continue to follow  If discussed at Long Length of Stay Meetings, dates discussed:    Additional Comments:  [name], RN 06/14/2016, 2:10 PM

## 2016-06-14 NOTE — H&P (View-Only) (Signed)
05/18/2016 [name]   1951/05/10  [id number]  Primary Physician [name], MD Primary Cardiologist: [name] MD [name], [place]  HPI:  The patient is a very pleasant 65 year old, thin-appearing, married Caucasian male, father of 50, grandfather to 4 grandchildren who I last saw 03/14/16. He has a history of coronary artery disease status post bypass grafting x5 by Dr. Merilynn Finland June 16, 2008, with a free LIMA to his LAD, sequential vein to the first diagonal branch, first and second obtuse marginal branches, as well as a vein to the PDA. His other problems include hypertension and hyperlipidemia. He denies chest pain or shortness of breath. I did perform cerebral angiography on him May 05, 2008, because of a question of a high-grade internal carotid artery stenosis read by the radiologist. However, this turned out to be a left external carotid artery stenosis. He does have high-grade ostial left internal mammary artery stenosis which necessitated the use of a free LIMA. He is statin intolerant. 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 A Myoview stress test performed December 11, 2011, was normal and carotid Dopplers did show a moderately severe right ICA stenosis which we have been following by duplex ultrasound. 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 Since I saw him 12 months ago he has noticed increasing dyspnea on exertion over the last several months, especially noticeable while mowing his lawn. I obtained a Myoview stress test on him based on this 03/29/16 that showed a large reversible lateral wall perfusion defect  consistent with ischemia. He underwent outpatient cardiac catheterization by myself on 05/14/16 revealing severe  native and graft vessel disease with normal LV function. Specifically, his RCA vein graft was occluded to an occluded dominant right and his circumflex vein graft which was sequential to 2 obtuse marginal branches was occluded at the aorta to patent continuation between OM1 and 2. He did have native circumflex disease. Dr. Roxan Hockey saw him during his brief hospitalization to discuss the possibility of redo coronary artery bypass grafting her consensus was that he would be better served with attempted PCI and stenting of his native circumflex coronary artery.  Current Outpatient Prescriptions  Medication Sig Dispense Refill  . Alirocumab (PRALUENT) 150 MG/ML SOPN Inject 150 mg into the skin every 14 (fourteen) days. 2 pen 11  . amLODipine (NORVASC) 10 MG tablet TAKE 1 TABLET (10 MG TOTAL) BY MOUTH DAILY. 90 tablet 0  . amphetamine-dextroamphetamine (ADDERALL) 30 MG tablet Take 30 mg by mouth 3 (three) times daily.      Marland Kitchen aspirin 81 MG tablet Take 81 mg by mouth daily.    Marland Kitchen CIALIS 20 MG tablet TAKE 1 TABLET (20 MG TOTAL) BY MOUTH DAILY AS NEEDED FOR ERECTILE DYSFUNCTION. 18 tablet 3  . esomeprazole (NEXIUM) 20 MG capsule Take 20 mg by mouth daily at 12 noon.    . hydrochlorothiazide (MICROZIDE) 12.5 MG capsule Take 1 capsule (12.5 mg total) by mouth daily. 90 capsule 1  . ibuprofen (ADVIL,MOTRIN) 200 MG tablet Take 200 mg by mouth every 8 (eight) hours as needed for moderate pain.     Marland Kitchen levothyroxine (SYNTHROID, LEVOTHROID) 75 MCG tablet TAKE 1 TABLET (75 MCG TOTAL) BY MOUTH DAILY. 90 tablet 1  . LORazepam (ATIVAN)  1 MG tablet Take 1 mg by mouth every 8 (eight) hours as needed for anxiety.     Marland Kitchen losartan (COZAAR) 50 MG tablet Take 1 tablet by mouth daily.  8  . metoprolol succinate (TOPROL-XL) 25 MG 24 hr tablet TAKE 1 TABLET (25 MG TOTAL) BY MOUTH DAILY. 90 tablet 3  . omega-3 acid ethyl esters (LOVAZA) 1 G capsule Take 2 capsules (2 g total) by mouth 2 (two) times daily. 120 capsule 5  . traMADol  (ULTRAM) 50 MG tablet Take 50 mg by mouth every 4 (four) hours as needed for moderate pain.   0  . TRINTELLIX 5 MG TABS Take 1 tablet by mouth daily.   2  . clopidogrel (PLAVIX) 75 MG tablet Take 1 tablet (75 mg total) by mouth daily. 90 tablet 3   No current facility-administered medications for this visit.     Allergies  Allergen Reactions  . Crestor [Rosuvastatin Calcium] Other (See Comments)    Difficulty swallowing  . Lipitor [Atorvastatin Calcium] Other (See Comments)    Muscle spasms  . Lisinopril Cough  . Tricor [Fenofibrate]     Social History   Social History  . Marital status: Married    Spouse name: N/A  . Number of children: N/A  . Years of education: N/A   Occupational History  . Not on file.   Social History Main Topics  . Smoking status: Former Smoker    Packs/day: 3.00    Years: 30.00    Types: Cigarettes    Quit date: 01/29/2001  . Smokeless tobacco: Never Used  . Alcohol use 4.2 oz/week    7 Cans of beer per week     Comment: daily  . Drug use: No  . Sexual activity: Not on file   Other Topics Concern  . Not on file   Social History Narrative  . No narrative on file     Review of Systems: General: negative for chills, fever, night sweats or weight changes.  Cardiovascular: negative for chest pain, dyspnea on exertion, edema, orthopnea, palpitations, paroxysmal nocturnal dyspnea or shortness of breath Dermatological: negative for rash Respiratory: negative for cough or wheezing Urologic: negative for hematuria Abdominal: negative for nausea, vomiting, diarrhea, bright red blood per rectum, melena, or hematemesis Neurologic: negative for visual changes, syncope, or dizziness All other systems reviewed and are otherwise negative except as noted above.    Blood pressure 108/77, pulse 86, height 5\' 7"  (1.702 m), weight 163 lb (73.9 kg).  General appearance: alert and no distress Neck: no adenopathy, no carotid bruit, no JVD, supple,  symmetrical, trachea midline and thyroid not enlarged, symmetric, no tenderness/mass/nodules Lungs: clear to auscultation bilaterally Heart: regular rate and rhythm, S1, S2 normal, no murmur, click, rub or gallop Extremities: extremities normal, atraumatic, no cyanosis or edema and Moderate ecchymosis and resolving hematoma right groin  EKG performed today  ASSESSMENT AND PLAN:   Coronary artery disease Mr. Centola returns today after his recent cardiac catheterization which I performed as an outpatient on 05/14/16 because of progressive dyspnea. He has had coronary artery bypass grafting 06/16/08. His cath revealed a patent LIMA to the LAD, and occluded vein to the distal right with occluded native right and an occluded vein from the aorta down to the first marginal branch with patent continuation to the second marginal branch. He does have AV groove circumflex and proximal marginal branch stenosis. His EF is normal. I had Dr. Gorden Harms and review his angiogram and discussed possible options  with the patient and his wife. The consensus is that he is not an optimal patient for redo bypass surgery and therefore we will pursue PCI stenting of the circumflex coronary artery.      [name] MD FACP,FACC,FAHA, [facility] 05/18/2016 1:54 PM

## 2016-06-14 NOTE — Progress Notes (Signed)
Site area: right groin  Site Prior to Removal:  Level 0  Pressure Applied For 30 MINUTES    Minutes Beginning at 1100  Manual:   Yes.    Patient Status During Pull:  Patient remains alert and oriented by four during procedure.  Post Pull Groin Site:  Level 0  Post Pull Instructions Given:  Yes.    Post Pull Pulses Present:  Yes.    Dressing Applied:  Yes.    Comments:  Patient tolerated procedure well. No bleeding noted and no hematoma noted. Pressure dressing applied and post sheath removal instructions given. No s/s of distress noted or complaints voiced at this time. Call bell is in reach and bed is in lowest position.

## 2016-06-14 NOTE — H&P (Signed)
[] Hide copied text     06/14/16 NEO TARWATER   11-14-1950  [id number]  Primary Physician [name], MD Primary Cardiologist: [name] MD [name], [place]  HPI:  The patient is a very pleasant 65 year old, thin-appearing, married Caucasian male, father of 33, grandfather to 4 grandchildren who I last saw 03/14/16. He has a history of coronary artery disease status post bypass grafting x5 by Dr. Merilynn Finland June 16, 2008, with a free LIMA to his LAD, sequential vein to the first diagonal branch, first and second obtuse marginal branches, as well as a vein to the PDA. His other problems include hypertension and hyperlipidemia. He denies chest pain or shortness of breath. I did perform cerebral angiography on him May 05, 2008, because of a question of a high-grade internal carotid artery stenosis read by the radiologist. However, this turned out to be a left external carotid artery stenosis. He does have high-grade ostial left internal mammary artery stenosis which necessitated the use of a free LIMA. He is statin intolerant. 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 A Myoview stress test performed December 11, 2011, was normal and carotid Dopplers did show a moderately severe right ICA stenosis which we have been following by duplex ultrasound. 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 Since I saw him 12 months ago he has noticed increasing dyspnea on exertion over the last several months, especially noticeable while mowing his lawn. I obtained a Myoview stress test on him based on this 03/29/16 that showed a large reversible lateral wall perfusion defect consistent with ischemia. He underwent outpatient cardiac catheterization by myself on  05/14/16 revealing severe native and graft vessel disease with normal LV function. Specifically, his RCA vein graft was occluded to an occluded dominant right and his circumflex vein graft which was sequential to 2 obtuse marginal branches was occluded at the aorta to patent continuation between OM1 and 2. He did have native circumflex disease. Dr. Roxan Hockey saw him during his brief hospitalization to discuss the possibility of redo coronary artery bypass grafting although the consensus was that he would be better served with attempted PCI and stenting of his native circumflex coronary artery.  Current Outpatient Prescriptions  Medication Sig Dispense Refill  . Alirocumab (PRALUENT) 150 MG/ML SOPN Inject 150 mg into the skin every 14 (fourteen) days. 2 pen 11  . amLODipine (NORVASC) 10 MG tablet TAKE 1 TABLET (10 MG TOTAL) BY MOUTH DAILY. 90 tablet 0  . amphetamine-dextroamphetamine (ADDERALL) 30 MG tablet Take 30 mg by mouth 3 (three) times daily.      Marland Kitchen aspirin 81 MG tablet Take 81 mg by mouth daily.    Marland Kitchen CIALIS 20 MG tablet TAKE 1 TABLET (20 MG TOTAL) BY MOUTH DAILY AS NEEDED FOR ERECTILE DYSFUNCTION. 18 tablet 3  . esomeprazole (NEXIUM) 20 MG capsule Take 20 mg by mouth daily at 12 noon.    . hydrochlorothiazide (MICROZIDE) 12.5 MG capsule Take 1 capsule (12.5 mg total) by mouth daily. 90 capsule 1  . ibuprofen (ADVIL,MOTRIN) 200 MG tablet Take 200 mg by mouth every 8 (eight) hours as needed for moderate pain.     Marland Kitchen levothyroxine (SYNTHROID, LEVOTHROID) 75 MCG tablet TAKE 1 TABLET (75 MCG TOTAL) BY MOUTH DAILY. 90 tablet 1  .  LORazepam (ATIVAN) 1 MG tablet Take 1 mg by mouth every 8 (eight) hours as needed for anxiety.     Marland Kitchen losartan (COZAAR) 50 MG tablet Take 1 tablet by mouth daily.  8  . metoprolol succinate (TOPROL-XL) 25 MG 24 hr tablet TAKE 1 TABLET (25 MG TOTAL) BY MOUTH DAILY. 90 tablet 3  . omega-3 acid ethyl esters (LOVAZA) 1 G capsule Take 2 capsules (2 g total) by mouth 2  (two) times daily. 120 capsule 5  . traMADol (ULTRAM) 50 MG tablet Take 50 mg by mouth every 4 (four) hours as needed for moderate pain.   0  . TRINTELLIX 5 MG TABS Take 1 tablet by mouth daily.   2  . clopidogrel (PLAVIX) 75 MG tablet Take 1 tablet (75 mg total) by mouth daily. 90 tablet 3   No current facility-administered medications for this visit.          Allergies  Allergen Reactions  . Crestor [Rosuvastatin Calcium] Other (See Comments)    Difficulty swallowing  . Lipitor [Atorvastatin Calcium] Other (See Comments)    Muscle spasms  . Lisinopril Cough  . Tricor [Fenofibrate]     Social History        Social History  . Marital status: Married    Spouse name: N/A  . Number of children: N/A  . Years of education: N/A      Occupational History  . Not on file.         Social History Main Topics  . Smoking status: Former Smoker    Packs/day: 3.00    Years: 30.00    Types: Cigarettes    Quit date: 01/29/2001  . Smokeless tobacco: Never Used  . Alcohol use 4.2 oz/week    7 Cans of beer per week     Comment: daily  . Drug use: No  . Sexual activity: Not on file       Other Topics Concern  . Not on file      Social History Narrative  . No narrative on file     Review of Systems: General: negative for chills, fever, night sweats or weight changes.  Cardiovascular: negative for chest pain, dyspnea on exertion, edema, orthopnea, palpitations, paroxysmal nocturnal dyspnea or shortness of breath Dermatological: negative for rash Respiratory: negative for cough or wheezing Urologic: negative for hematuria Abdominal: negative for nausea, vomiting, diarrhea, bright red blood per rectum, melena, or hematemesis Neurologic: negative for visual changes, syncope, or dizziness All other systems reviewed and are otherwise negative except as noted above.    Blood pressure 108/77, pulse 86, height 5\' 7"  (1.702 m), weight 163 lb (73.9  kg).  General appearance: alert and no distress Neck: no adenopathy, no carotid bruit, no JVD, supple, symmetrical, trachea midline and thyroid not enlarged, symmetric, no tenderness/mass/nodules Lungs: clear to auscultation bilaterally Heart: regular rate and rhythm, S1, S2 normal, no murmur, click, rub or gallop Extremities: extremities normal, atraumatic, no cyanosis or edema and Moderate ecchymosis and resolving hematoma right groin  EKG performed today  ASSESSMENT AND PLAN:   Coronary artery disease Mr. Vandervoort returns today after his recent cardiac catheterization which I performed as an outpatient on 05/14/16 because of progressive dyspnea. He has had coronary artery bypass grafting 06/16/08. His cath revealed a patent LIMA to the LAD, and occluded vein to the distal right with occluded native right and an occluded vein from the aorta down to the first marginal branch with patent continuation to the second marginal  branch. He does have AV groove circumflex and proximal marginal branch stenosis. His EF is normal. I had Dr. Gorden Harms and review his angiogram and discussed possible options with the patient and his wife. The consensus is that he is not an optimal patient for redo bypass surgery and therefore we will pursue PCI stenting of the circumflex coronary artery.      [name] MD [place], [facility] 06/14/1716

## 2016-06-14 NOTE — Interval H&P Note (Signed)
Cath Lab Visit (complete for each Cath Lab visit)  Clinical Evaluation Leading to the Procedure:   ACS: No.  Non-ACS:    Anginal Classification: CCS III  Anti-ischemic medical therapy: Maximal Therapy (2 or more classes of medications)  Non-Invasive Test Results: Intermediate-risk stress test findings: cardiac mortality 1-3%/year  Prior CABG: Previous CABG      History and Physical Interval Note:  06/14/2016 7:30 AM  [name]  has presented today for surgery, with the diagnosis of abnormal stress test  The various methods of treatment have been discussed with the patient and family. After consideration of risks, benefits and other options for treatment, the patient has consented to  Procedure(s): Coronary Stent Intervention (N/A) as a surgical intervention .  The patient's history has been reviewed, patient examined, no change in status, stable for surgery.  I have reviewed the patient's chart and labs.  Questions were answered to the patient's satisfaction.     Quay Burow

## 2016-06-15 ENCOUNTER — Encounter (HOSPITAL_COMMUNITY): Payer: Self-pay | Admitting: Cardiology

## 2016-06-15 ENCOUNTER — Encounter (HOSPITAL_COMMUNITY): Payer: Self-pay

## 2016-06-15 ENCOUNTER — Ambulatory Visit (HOSPITAL_COMMUNITY): Admit: 2016-06-15 | Payer: 59 | Admitting: Orthopedic Surgery

## 2016-06-15 DIAGNOSIS — I10 Essential (primary) hypertension: Secondary | ICD-10-CM | POA: Diagnosis not present

## 2016-06-15 DIAGNOSIS — R9439 Abnormal result of other cardiovascular function study: Secondary | ICD-10-CM

## 2016-06-15 DIAGNOSIS — Z95820 Peripheral vascular angioplasty status with implants and grafts: Secondary | ICD-10-CM

## 2016-06-15 DIAGNOSIS — E785 Hyperlipidemia, unspecified: Secondary | ICD-10-CM | POA: Diagnosis not present

## 2016-06-15 DIAGNOSIS — K219 Gastro-esophageal reflux disease without esophagitis: Secondary | ICD-10-CM | POA: Diagnosis not present

## 2016-06-15 DIAGNOSIS — I2510 Atherosclerotic heart disease of native coronary artery without angina pectoris: Secondary | ICD-10-CM | POA: Diagnosis not present

## 2016-06-15 DIAGNOSIS — I6522 Occlusion and stenosis of left carotid artery: Secondary | ICD-10-CM | POA: Diagnosis not present

## 2016-06-15 DIAGNOSIS — N529 Male erectile dysfunction, unspecified: Secondary | ICD-10-CM | POA: Diagnosis not present

## 2016-06-15 DIAGNOSIS — I2583 Coronary atherosclerosis due to lipid rich plaque: Secondary | ICD-10-CM | POA: Diagnosis not present

## 2016-06-15 DIAGNOSIS — I2582 Chronic total occlusion of coronary artery: Secondary | ICD-10-CM | POA: Diagnosis not present

## 2016-06-15 DIAGNOSIS — Z951 Presence of aortocoronary bypass graft: Secondary | ICD-10-CM | POA: Diagnosis not present

## 2016-06-15 HISTORY — DX: Peripheral vascular angioplasty status with implants and grafts: Z95.820

## 2016-06-15 LAB — CBC
HCT: 41.3 % (ref 39.0–52.0)
Hemoglobin: 13.7 g/dL (ref 13.0–17.0)
MCH: 28.8 pg (ref 26.0–34.0)
MCHC: 33.2 g/dL (ref 30.0–36.0)
MCV: 86.8 fL (ref 78.0–100.0)
PLATELETS: 138 10*3/uL — AB (ref 150–400)
RBC: 4.76 MIL/uL (ref 4.22–5.81)
RDW: 13.5 % (ref 11.5–15.5)
WBC: 5 10*3/uL (ref 4.0–10.5)

## 2016-06-15 LAB — BASIC METABOLIC PANEL
Anion gap: 9 (ref 5–15)
BUN: 11 mg/dL (ref 6–20)
CALCIUM: 8.7 mg/dL — AB (ref 8.9–10.3)
CO2: 26 mmol/L (ref 22–32)
CREATININE: 1.03 mg/dL (ref 0.61–1.24)
Chloride: 107 mmol/L (ref 101–111)
GFR calc Af Amer: >60 mL/min (ref >60)
GFR calc non Af Amer: >60 mL/min (ref >60)
GLUCOSE: 115 mg/dL — AB (ref 65–99)
Potassium: 3.6 mmol/L (ref 3.5–5.1)
Sodium: 142 mmol/L (ref 135–145)

## 2016-06-15 SURGERY — SHOULDER ARTHROSCOPY WITH SUBACROMIAL DECOMPRESSION AND OPEN ROTATOR CUFF REPAIR, OPEN BICEPS TENDON REPAIR
Anesthesia: Choice | Laterality: Right

## 2016-06-15 MED ORDER — PANTOPRAZOLE SODIUM 40 MG PO TBEC: 40.0000 mg | DELAYED_RELEASE_TABLET | Freq: Every day | ORAL | 11 refills | Status: DC

## 2016-06-15 MED FILL — PANTOPRAZOLE SOD DR 40 MG T: 40 | 90 days supply | Qty: 90 | Fill #0

## 2016-06-15 NOTE — Progress Notes (Signed)
Subjective:  No CP/SOB, S/P LCX PCI/DES  Objective:  Temp:  [97.2 F (36.2 C)-98.3 F (36.8 C)] 97.5 F (36.4 C) (09/15 0800) Pulse Rate:  [63-78] 70 (09/15 0800) Resp:  [12-24] 18 (09/15 0800) BP: (93-185)/(58-88) 116/85 (09/15 0800) SpO2:  [92 %-100 %] 97 % (09/15 0800) Weight:  [164 lb 14.4 oz (74.8 kg)] 164 lb 14.4 oz (74.8 kg) (09/15 0418) Weight change: 4 lb 14.4 oz (2.223 kg)  Intake/Output from previous day: 09/14 0701 - 09/15 0700 In: 1228 [P.O.:360; I.V.:868] Out: 2300 [Urine:2300]  Intake/Output from this shift: Total I/O In: 360 [P.O.:360] Out: 450 [Urine:450]  Physical Exam: General appearance: alert and no distress Neck: no adenopathy, no carotid bruit, no JVD, supple, symmetrical, trachea midline and thyroid not enlarged, symmetric, no tenderness/mass/nodules Lungs: clear to auscultation bilaterally Heart: regular rate and rhythm, S1, S2 normal, no murmur, click, rub or gallop Extremities: extremities normal, atraumatic, no cyanosis or edema  Lab Results: Results for orders placed or performed during the hospital encounter of 06/14/16 (from the past 48 hour(s))  Basic metabolic panel     Status: Abnormal   Collection Time: 06/14/16  6:50 AM  Result Value Ref Range   Sodium 140 135 - 145 mmol/L   Potassium 3.5 3.5 - 5.1 mmol/L   Chloride 106 101 - 111 mmol/L   CO2 25 22 - 32 mmol/L   Glucose, Bld 87 65 - 99 mg/dL   BUN 13 6 - 20 mg/dL   Creatinine, Ser 1.04 0.61 - 1.24 mg/dL   Calcium 8.7 (L) 8.9 - 10.3 mg/dL   GFR calc non Af Amer >60 >60 mL/min   GFR calc Af Amer >60 >60 mL/min    Comment: (NOTE) The eGFR has been calculated using the CKD EPI equation. This calculation has not been validated in all clinical situations. eGFR's persistently <60 mL/min signify possible Chronic Kidney Disease.    Anion gap 9 5 - 15  Lipid panel     Status: Abnormal   Collection Time: 06/14/16  6:51 AM  Result Value Ref Range   Cholesterol 126 0 - 200 mg/dL    Triglycerides 164 (H) <150 mg/dL   HDL 63 >40 mg/dL   Total CHOL/HDL Ratio 2.0 RATIO   VLDL 33 0 - 40 mg/dL   LDL Cholesterol 30 0 - 99 mg/dL    Comment:        Total Cholesterol/HDL:CHD Risk Coronary Heart Disease Risk Table                     Men   Women  1/2 Average Risk   3.4   3.3  Average Risk       5.0   4.4  2 X Average Risk   9.6   7.1  3 X Average Risk  23.4   11.0        Use the calculated Patient Ratio above and the CHD Risk Table to determine the patient's CHD Risk.        ATP III CLASSIFICATION (LDL):  <100     mg/dL   Optimal  100-129  mg/dL   Near or Above                    Optimal  130-159  mg/dL   Borderline  160-189  mg/dL   High  >190     mg/dL   Very High   POCT Activated clotting time  Status: None   Collection Time: 06/14/16  8:36 AM  Result Value Ref Range   Activated Clotting Time 373 seconds  Basic metabolic panel     Status: Abnormal   Collection Time: 06/15/16  4:23 AM  Result Value Ref Range   Sodium 142 135 - 145 mmol/L   Potassium 3.6 3.5 - 5.1 mmol/L   Chloride 107 101 - 111 mmol/L   CO2 26 22 - 32 mmol/L   Glucose, Bld 115 (H) 65 - 99 mg/dL   BUN 11 6 - 20 mg/dL   Creatinine, Ser 1.03 0.61 - 1.24 mg/dL   Calcium 8.7 (L) 8.9 - 10.3 mg/dL   GFR calc non Af Amer >60 >60 mL/min   GFR calc Af Amer >60 >60 mL/min    Comment: (NOTE) The eGFR has been calculated using the CKD EPI equation. This calculation has not been validated in all clinical situations. eGFR's persistently <60 mL/min signify possible Chronic Kidney Disease.    Anion gap 9 5 - 15  CBC     Status: Abnormal   Collection Time: 06/15/16  4:23 AM  Result Value Ref Range   WBC 5.0 4.0 - 10.5 K/uL   RBC 4.76 4.22 - 5.81 MIL/uL   Hemoglobin 13.7 13.0 - 17.0 g/dL   HCT 41.3 39.0 - 52.0 %   MCV 86.8 78.0 - 100.0 fL   MCH 28.8 26.0 - 34.0 pg   MCHC 33.2 30.0 - 36.0 g/dL   RDW 13.5 11.5 - 15.5 %   Platelets 138 (L) 150 - 400 K/uL    Imaging: Imaging results have  been reviewed  Tele- NSR/ BBB  Assessment/Plan:   1. Active Problems: 2.   Coronary artery disease 3.   CAD (coronary artery disease) 4.   Time Spent Directly with Patient:  20 minutes  Length of Stay:  LOS: 0 days   S/P LCX/ OM PCI/ DES with Synergy 2.25 mm X 38 mm DES (post dil with 2.5 mm balloon). Excellent angiographic and clinical result. Labs OK. Groin OK. D/C home this AM on ASA 81/ Plavix. ROV with me 3-4 weeks   Quay Burow 06/15/2016, 8:56 AM

## 2016-06-15 NOTE — Discharge Summary (Signed)
Physician Discharge Summary       Patient ID: [name] MRN: [id number] DOB/AGE: [birth year] 64 y.o.  Admit date: 06/14/2016 Discharge date: 06/15/2016 Primary Cardiologist:Dr. Allyson Sabal   Discharge Diagnoses:  Principal Problem:   Abnormal stress test Active Problems:   S/P angioplasty with stent 06/14/16 PCI & DES of high grade AV groove LCX and 1st OM    Coronary artery disease   Hypertension   GE reflux   Hyperlipidemia   Erectile dysfunction   CAD (coronary artery disease)   Discharged Condition: good  Procedures: 06/14/16  By Dr. Allyson Sabal and Dr. Okey Dupre Successful PCI and drug-eluting stenting of high-grade AV groove circumflex and first obtuse marginal branch stenosis in the setting of an occluded sequential vein graft with a patent continuation from OM1- OM 2 . The patient thought the procedure well. He will continue dual antiplatelet therapy. He'll be hydrated overnight and discharged home in the morning. I will see him back in the office in 2-3 weeks for follow-up. Coronary Findings   Dominance: Right  Left Circumflex  Prox Cx to Mid Cx lesion, 80% stenosed.  Angioplasty: A stent was successfully placed.  There is no residual stenosis post intervention.  Mid Cx to Dist Cx lesion, 100% stenosed.  Second Obtuse Marginal Branch  Ost 2nd Mrg to 2nd Mrg lesion, 95% stenosed.  Angioplasty: A stent was successfully placed.  There is no residual stenosis post intervention.  Graft Angiography  saphenous Graft to 3rd Mrg  SVG.  Origin lesion, 100% stenosed.  Coronary Diagrams   Diagnostic Diagram     Post-Intervention Diagram     Implants     Permanent Stent  Stent Synergy Des [id number] - [id number] - Implanted    Inventory item: Stent Synergy Des 2.25x38 Model/Cat number: [id number]  Manufacturer: [employer] Lot number: [id number]  Device identifier: [id number] Device identifier type: GS1  GUDID Information   Request status Successful      Brand name: SYNERGYT Version/Model: [id number]  Company name: [employer] MRI safety info as of 06/14/16: MR Conditional  Contains dry or latex rubber: No    GMDN P.T. name: Drug-eluting coronary artery stent, non-bioabsorbable-polymer-coated    As of 06/14/2016   Status: Implanted      Ost RCA to Prox RCA lesion, 100 %stenosed.  Ost 1st Diag to 1st Diag lesion, 100 %stenosed.  Mid Cx to Dist Cx lesion, 100 %stenosed.  Ost 2nd Mrg to 2nd Mrg lesion, 90 %stenosed.  Prox Cx to Mid Cx lesion, 70 %stenosed.  Origin to Prox Graft lesion, 100 %stenosed.  Origin to Dist Graft lesion, 100 %stenosed.  LIMA.  The left ventricular systolic function is normal.  LV end diastolic pressure is normal.  The left ventricular ejection fraction is 55-65% by visual estimate.      Hospital Course:  65 year old male He has a history of coronary artery disease status post bypass grafting x5 by Dr. Andrey Spearman June 16, 2008, with a free LIMA to his LAD, sequential vein to the first diagonal branch, first and second obtuse marginal branches, as well as a vein to the PDA. His other problems include hypertension and hyperlipidemia. He denies chest pain or shortness of breath. I did perform cerebral angiography on him May 05, 2008, because of a question of a high-grade internal carotid artery stenosis read by the radiologist. However, this turned out to be a left external carotid artery stenosis. He does have high-grade ostial left internal mammary artery stenosis  which necessitated the use of a free LIMA. He is statin intolerant. 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 A Myoview stress test performed December 11, 2011, was normal and carotid Dopplers did show a moderately severe right ICA stenosis which we have been following by duplex ultrasound. 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 Since I saw him 12 months ago he has noticed increasing dyspnea on exertion over the last several months, especially noticeable while mowing his lawn. I obtained a Myoview stress test on him based on this 03/29/16 that showed a large reversible lateral wall perfusion defect consistent with ischemia.He underwent outpatient cardiac catheterization by myself on 05/14/16 revealing severe native and graft vessel disease with normal LV function. Specifically, his RCA vein graft was occluded to an occluded dominant right and his circumflex vein graft which was sequential to 2 obtuse marginal branches was occluded at the aorta to patent continuation between OM1 and 2. He did have native circumflex disease. Dr. Dorris Fetch saw him during his brief hospitalization to discuss the possibility of redo coronary artery bypass grafting although the consensus was that he would be better served with attempted PCI and stenting of his native circumflex coronary artery.    Presented for cath PCI 06/14/16 with  of high-grade AV groove circumflex and first obtuse marginal branch stenosis in the setting of an occluded sequential vein graft with a patent continuation from OM1- OM 2 .   He did well post procedure and the next AM was stable for discharge.  He was seen and evaluated by Dr. Allyson Sabal.    He is on BB  And ARB No statin due to allergy.  Continue asa and Plavix. Referral to cardiac rehab has been made.   Consults: None  Significant Diagnostic Studies:  BMP Latest Ref Rng & Units 06/15/2016 06/14/2016 06/08/2016  Glucose 65 - 99 mg/dL 409(W) 87 119(J)  BUN 6 - 20 mg/dL 11 13 18   Creatinine 0.61 - 1.24 mg/dL 4.78 2.95 6.21(H)  Sodium 135 - 145 mmol/L 142 140 141  Potassium 3.5 - 5.1 mmol/L 3.6 3.5 4.6  Chloride 101 - 111 mmol/L 107 106 105  CO2 22 - 32 mmol/L 26 25 27   Calcium  8.9 - 10.3 mg/dL 0.8(M) 5.7(Q) 9.1   CBC Latest Ref Rng & Units 06/15/2016 06/08/2016 05/18/2016  WBC 4.0 - 10.5 K/uL 5.0 4.8 4.4  Hemoglobin 13.0 - 17.0 g/dL 46.9 62.9 52.8  Hematocrit 39.0 - 52.0 % 41.3 42.1 43.0  Platelets 150 - 400 K/uL 138(L) 166 182    Lipid Panel     Component Value Date/Time   CHOL 126 06/14/2016 0651   TRIG 164 (H) 06/14/2016 0651   HDL 63 06/14/2016 0651   CHOLHDL 2.0 06/14/2016 0651   VLDL 33 06/14/2016 0651   LDLCALC 30 06/14/2016 0651    EKG SR with RBBB and LAFB  Discharge Exam: Blood pressure 116/85, pulse 70, temperature 97.5 F (36.4 C), temperature source Oral, resp. rate 18, height 5\' 7"  (1.702 m), weight 164 lb 14.4 oz (74.8 kg), SpO2 97 %.   Disposition: 01-Home or Self Care  Discharge Instructions    AMB Referral to Cardiac Rehabilitation - Phase II    Complete by:  As directed    Diagnosis:  Coronary  Stents   Amb Referral to Cardiac Rehabilitation    Complete by:  As directed    Diagnosis:  Coronary Stents Comment - to [place]       Medication List    STOP taking these medications   esomeprazole 20 MG capsule Commonly known as:  NEXIUM Replaced by:  pantoprazole 40 MG tablet     TAKE these medications   Alirocumab 150 MG/ML Sopn Commonly known as:  PRALUENT Inject 150 mg into the skin every 14 (fourteen) days.   amLODipine 10 MG tablet Commonly known as:  NORVASC Take 1 tablet (10 mg total) by mouth daily.   amphetamine-dextroamphetamine 30 MG tablet Commonly known as:  ADDERALL Take 30 mg by mouth 3 (three) times daily.   aspirin 81 MG tablet Take 81 mg by mouth daily.   CIALIS 20 MG tablet Generic drug:  tadalafil TAKE 1 TABLET (20 MG TOTAL) BY MOUTH DAILY AS NEEDED FOR ERECTILE DYSFUNCTION.   clopidogrel 75 MG tablet Commonly known as:  PLAVIX Take 1 tablet (75 mg total) by mouth daily.   hydrochlorothiazide 12.5 MG capsule Commonly known as:  MICROZIDE Take 1 capsule (12.5 mg total) by mouth daily.     ibuprofen 200 MG tablet Commonly known as:  ADVIL,MOTRIN Take 200 mg by mouth every 8 (eight) hours as needed for moderate pain.   levothyroxine 75 MCG tablet Commonly known as:  SYNTHROID, LEVOTHROID TAKE 1 TABLET (75 MCG TOTAL) BY MOUTH DAILY.   LORazepam 1 MG tablet Commonly known as:  ATIVAN Take 1 mg by mouth every 8 (eight) hours as needed for anxiety.   losartan 50 MG tablet Commonly known as:  COZAAR Take 50 mg by mouth daily.   metoprolol succinate 25 MG 24 hr tablet Commonly known as:  TOPROL-XL TAKE 1 TABLET (25 MG TOTAL) BY MOUTH DAILY.   omega-3 acid ethyl esters 1 g capsule Commonly known as:  LOVAZA Take 2 capsules (2 g total) by mouth 2 (two) times daily.   pantoprazole 40 MG tablet Commonly known as:  PROTONIX Take 1 tablet (40 mg total) by mouth daily. Start taking on:  06/16/2016 Replaces:  esomeprazole 20 MG capsule   traMADol 50 MG tablet Commonly known as:  ULTRAM Take 50 mg by mouth every 4 (four) hours as needed for moderate pain.   TRINTELLIX 5 MG Tabs Generic drug:  vortioxetine HBr Take 5 mg by mouth daily.      Follow-up Information    Nanetta Batty, MD Follow up on 06/29/2016.   Specialties:  Cardiology, Radiology Why:  at 8:45 AM Contact information: [address] [place] [address] [phone]            Discharge Instructions: Call [facility] at [phone] if any bleeding, swelling or drainage at cath site.  May shower, no tub baths for 48 hours for groin sticks. No lifting over 5 pounds for 3 days.  No Driving for 3 days.    Heart Healthy Diet  DO NOT STOP PLAVIX AND ASPRIN, TO KEEP STENT OPEN.    WE HAD TO CHANGE NEXIUM TO PROTONIX TO PROTECT THE STENT  Do not take Cialis within 72 hours of NTG may passing out at the least   Signed: Nada Boozer Nurse Practitioner-Certified [facility]: [facility] 06/15/2016, 3:12 PM  Time spent on discharge : > 30 minutes.

## 2016-06-15 NOTE — Care Management Note (Signed)
Case Management Note  Patient Details  Name: [name] MRN: [id number] Date of Birth: 04-19-1951  Subjective/Objective:    Patient is s/p coronary interventionl, will be on plavix, he lives with wife at home pta indep,  Has pcp, has medication coverage and has transportation at dc. No needs.                Action/Plan:   Expected Discharge Date:                  Expected Discharge Plan:  Home/Self Care  In-House Referral:     Discharge planning Services  CM Consult  Post Acute Care Choice:    Choice offered to:     DME Arranged:    DME Agency:     HH Arranged:    HH Agency:     Status of Service:  Completed, signed off  If discussed at [employer] of Stay Meetings, dates discussed:    Additional Comments:  [name], RN 06/15/2016, 9:09 AM

## 2016-06-15 NOTE — Progress Notes (Signed)
CARDIAC REHAB PHASE I   PRE:  Rate/Rhythm: 74 SR   BP:  Sitting: 116/85        SaO2: 99% RA  MODE:  Ambulation: 800 ft   POST:  Rate/Rhythm: 72 SR  BP:  Sitting: 118/71         SaO2: 99% RA  Pt ambulated  877ft on RA, independent, mostly steady gait, tolerated well.  No complaints. Completed stent education.  Reviewed risk factors, anti-platelet therapy, stent card, activity restrictions, ntg, exercise, heart healthy diet, and phase 2 cardiac rehab. Pt verbalized understanding. Pt agrees to phase 2 cardiac rehab referral. Will send referral to [facility] .  Pt to chair after walk, call bell within reach. [phone]  Lenna Sciara, RN, BSN 06/15/2016 9:05 AM

## 2016-06-15 NOTE — Discharge Instructions (Signed)
Call [facility] at [phone] if any bleeding, swelling or drainage at cath site.  May shower, no tub baths for 48 hours for groin sticks. No lifting over 5 pounds for 3 days.  No Driving for 3 days.    Heart Healthy Diet  DO NOT STOP PLAVIX AND ASPRIN, TO KEEP STENT OPEN.    WE HAD TO CHANGE NEXIUM TO PROTONIX TO PROTECT THE STENT  Do not take Cialis within 72 hours of NTG may passing out at the least

## 2016-06-25 ENCOUNTER — Other Ambulatory Visit: Payer: Self-pay | Admitting: Cardiovascular Disease

## 2016-06-25 MED FILL — LOSARTAN POTASSIUM 50 MG TA: 50 | 30 days supply | Qty: 30 | Fill #0

## 2016-06-26 ENCOUNTER — Other Ambulatory Visit: Payer: 59 | Admitting: Internal Medicine

## 2016-06-26 DIAGNOSIS — N529 Male erectile dysfunction, unspecified: Secondary | ICD-10-CM

## 2016-06-26 LAB — PSA: PSA: 2.6 ng/mL (ref <=4.0)

## 2016-06-27 ENCOUNTER — Other Ambulatory Visit: Payer: Self-pay | Admitting: Cardiovascular Disease

## 2016-06-27 DIAGNOSIS — E785 Hyperlipidemia, unspecified: Secondary | ICD-10-CM

## 2016-06-28 ENCOUNTER — Encounter: Payer: Self-pay | Admitting: Internal Medicine

## 2016-06-28 ENCOUNTER — Ambulatory Visit (INDEPENDENT_AMBULATORY_CARE_PROVIDER_SITE_OTHER): Payer: 59 | Admitting: Internal Medicine

## 2016-06-28 VITALS — BP 122/82 | HR 84 | Temp 97.4°F | Wt 165.0 lb

## 2016-06-28 DIAGNOSIS — M12819 Other specific arthropathies, not elsewhere classified, unspecified shoulder: Secondary | ICD-10-CM | POA: Diagnosis not present

## 2016-06-28 DIAGNOSIS — J209 Acute bronchitis, unspecified: Secondary | ICD-10-CM

## 2016-06-28 DIAGNOSIS — Z955 Presence of coronary angioplasty implant and graft: Secondary | ICD-10-CM

## 2016-06-28 DIAGNOSIS — J0100 Acute maxillary sinusitis, unspecified: Secondary | ICD-10-CM

## 2016-06-28 DIAGNOSIS — N400 Enlarged prostate without lower urinary tract symptoms: Secondary | ICD-10-CM

## 2016-06-28 DIAGNOSIS — M19011 Primary osteoarthritis, right shoulder: Secondary | ICD-10-CM

## 2016-06-28 MED ORDER — LEVOFLOXACIN 500 MG PO TABS: 500.0000 mg | ORAL_TABLET | Freq: Every day | ORAL | 0 refills | Status: DC

## 2016-06-28 MED ORDER — CEFTRIAXONE SODIUM 1 G IJ SOLR
1.0000 g | Freq: Once | INTRAMUSCULAR | Status: AC
  Administered 2016-06-28: 1 g via INTRAMUSCULAR

## 2016-06-28 MED ORDER — HYDROCODONE-ACETAMINOPHEN 10-325 MG PO TABS: 1.0000 | ORAL_TABLET | Freq: Two times a day (BID) | ORAL | 0 refills | Status: DC

## 2016-06-28 MED ORDER — PREDNISONE 10 MG PO TABS: ORAL_TABLET | ORAL | 0 refills | Status: DC

## 2016-06-28 MED FILL — predniSONE 10 MG TABS: 10 | 6 days supply | Qty: 21 | Fill #0

## 2016-06-28 MED FILL — levoFLOXacin 500 MG TABS: 500 | 10 days supply | Qty: 10 | Fill #0

## 2016-06-28 MED FILL — CIALIS 20 MG TABLET: 20 | 30 days supply | Qty: 6 | Fill #8

## 2016-06-28 MED FILL — HYDROCODON-APAP 10-325: 10-325 | 30 days supply | Qty: 60 | Fill #0

## 2016-06-28 MED FILL — PRALUENT 150 MG/ML PEN: 150 | 30 days supply | Qty: 2 | Fill #0

## 2016-06-28 NOTE — Patient Instructions (Signed)
Levaquin 500 milligrams daily for 10 days. Sterapred DS 10 mg 6 day dosepak. Rocephin 1 g IM given in office.  He had flu vaccine at hospital recently.  Continue to monitor PSA. Return in 6 months for fasting lab work and physical examination.  Hydrocodone/APAP 10/325 #60 tablets to take sparingly not to exceed twice daily for right shoulder arthropathy.

## 2016-06-28 NOTE — Progress Notes (Signed)
   Subjective:    Patient ID: [name], male    DOB: [birth year], 65 y.o.   MRN: [id number]  HPI 65 year old male here for Follow-up of elevated PSA. PSA was 3.736 months ago and is now 2.6. Over the past 5 years his PSA has been in the 2.5-2.77 range. I feel better about this. We will continue to monitor.  He was in the hospital recently for placement of a drug-eluting stent by Dr. Alvester Chou. He came down with a restaurant where infection is had considerable malaise fatigue and a little shortness of breath. Has had discolored sputum production and sounds nasally congested. No fever or shaking chills.  His right shoulder surgery is had to be postponed due to the drug-eluting stent placement. I'll give him hydrocodone/APAP 10/325 #60 tablets to take sparingly for shoulder pain.  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 Says is good for his mind if he stays busy.      Review of Systems     Objective:   Physical Exam  HENT:  Head: Normocephalic and atraumatic.  Right Ear: External ear normal.  Left Ear: External ear normal.  Mouth/Throat: Oropharynx is clear and moist. No oropharyngeal exudate.  Neck: Neck supple. No JVD present. No thyromegaly present.  Cardiovascular: Normal rate and intact distal pulses.   No murmur heard. Pulmonary/Chest: Effort normal and breath sounds normal. No respiratory distress. He has no wheezes. He has no rales.  Lymphadenopathy:    He has no cervical adenopathy.  Skin: Skin is warm and dry.  Psychiatric: He has a normal mood and affect. His behavior is normal. Judgment and thought content normal.          Assessment & Plan:  Right shoulder arthropathy- cannot have surgery now due to Drug-eluting stent. Hydrocodone/APAP prescription written.  Acute bronchitis and sinusitis  BPH- PSA has decreased  Depression-stable  Coronary artery disease  drug-eluting stent in place  Plan: Rocephin one gram  IM. Levaquin 500 milligrams daily for 10 days. Sterapred DS 10 mg 6 day Dosepak.

## 2016-06-29 ENCOUNTER — Ambulatory Visit (INDEPENDENT_AMBULATORY_CARE_PROVIDER_SITE_OTHER): Payer: 59 | Admitting: Cardiovascular Disease

## 2016-06-29 ENCOUNTER — Encounter: Payer: Self-pay | Admitting: Cardiovascular Disease

## 2016-06-29 DIAGNOSIS — Z959 Presence of cardiac and vascular implant and graft, unspecified: Secondary | ICD-10-CM

## 2016-06-29 DIAGNOSIS — Z95820 Peripheral vascular angioplasty status with implants and grafts: Secondary | ICD-10-CM

## 2016-06-29 NOTE — Progress Notes (Signed)
06/29/2016 [name]   Apr 16, 1951  [id number]  Primary Physician [name], MD Primary Cardiologist: [name] MD [name], [place]  HPI:  The patient is a very pleasant 65 year old, thin-appearing, married Caucasian male, father of 34, grandfather to 4 grandchildren who I last saw 05/18/16. He has a history of coronary artery disease status post bypass grafting x5 by Dr. Merilynn Finland June 16, 2008, with a free LIMA to his LAD, sequential vein to the first diagonal branch, first and second obtuse marginal branches, as well as a vein to the PDA. His other problems include hypertension and hyperlipidemia. He denies chest pain or shortness of breath. I did perform cerebral angiography on him May 05, 2008, because of a question of a high-grade internal carotid artery stenosis read by the radiologist. However, this turned out to be a left external carotid artery stenosis. He does have high-grade ostial left internal mammary artery stenosis which necessitated the use of a free LIMA. He is statin intolerant. 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 A Myoview stress test performed December 11, 2011, was normal and carotid Dopplers did show a moderately severe right ICA stenosis which we have been following by duplex ultrasound. 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 Since I saw him 12 months ago he has noticed increasing dyspnea on exertion over the last several months, especially noticeable while mowing his lawn. I obtained a Myoview stress test on him based on this 03/29/16 that showed a large reversible lateral wall perfusion defect consistent with ischemia.He underwent outpatient cardiac catheterization by myself on 05/14/16 revealing severe  native and graft vessel disease with normal LV function. Specifically, his RCA vein graft was occluded to an occluded dominant right and his circumflex vein graft which was sequential to 2 obtuse marginal branches was occluded at the aorta to patent continuation between OM1 and 2. He did have native circumflex disease. Dr. Roxan Hockey saw him during his brief hospitalization to discuss the possibility of redo coronary artery bypass grafting although the consensus was that he would be better served with attempted PCI and stenting of his native circumflex coronary artery. I perform this electively on 06/14/16 and implanted a 2.25 mm x 38 mm long synergy drug-eluting stent in his AV groove circumflex into his obtuse marginal branch. He had excellent intravesical clinical result. He was discharged on the following day. He feels clinically improved.   Current Outpatient Prescriptions  Medication Sig Dispense Refill  . amLODipine (NORVASC) 10 MG tablet Take 1 tablet (10 mg total) by mouth daily. 90 tablet 0  . amphetamine-dextroamphetamine (ADDERALL) 30 MG tablet Take 30 mg by mouth 3 (three) times daily.      Marland Kitchen aspirin 81 MG tablet Take 81 mg by mouth daily.    Marland Kitchen CIALIS 20 MG tablet TAKE 1 TABLET (20 MG TOTAL) BY MOUTH DAILY AS NEEDED FOR ERECTILE DYSFUNCTION. 18 tablet 3  . clopidogrel (PLAVIX) 75 MG tablet Take 1 tablet (75 mg total) by mouth daily. 90 tablet 3  . hydrochlorothiazide (MICROZIDE) 12.5 MG capsule Take 1 capsule (12.5 mg total) by mouth daily. 90 capsule 1  . HYDROcodone-acetaminophen (NORCO) 10-325 MG tablet Take 1 tablet by mouth 2 (two) times daily. 60 tablet 0  . ibuprofen (  ADVIL,MOTRIN) 200 MG tablet Take 200 mg by mouth every 8 (eight) hours as needed for moderate pain.     Marland Kitchen levofloxacin (LEVAQUIN) 500 MG tablet Take 1 tablet (500 mg total) by mouth daily. 10 tablet 0  . levothyroxine (SYNTHROID, LEVOTHROID) 75 MCG tablet TAKE 1 TABLET (75 MCG TOTAL) BY MOUTH DAILY. 90 tablet 1  .  LORazepam (ATIVAN) 1 MG tablet Take 1 mg by mouth every 8 (eight) hours as needed for anxiety.     Marland Kitchen losartan (COZAAR) 50 MG tablet Take 50 mg by mouth daily.   8  . losartan (COZAAR) 50 MG tablet TAKE 1 TABLET (50 MG TOTAL) BY MOUTH DAILY. 30 tablet 8  . metoprolol succinate (TOPROL-XL) 25 MG 24 hr tablet TAKE 1 TABLET (25 MG TOTAL) BY MOUTH DAILY. 90 tablet 3  . omega-3 acid ethyl esters (LOVAZA) 1 G capsule Take 2 capsules (2 g total) by mouth 2 (two) times daily. 120 capsule 5  . pantoprazole (PROTONIX) 40 MG tablet Take 1 tablet (40 mg total) by mouth daily. 30 tablet 11  . PRALUENT 150 MG/ML SOPN INJECT 150 MG INTO THE SKIN EVERY 14 DAYS. 2 mL 11  . predniSONE (DELTASONE) 10 MG tablet Take in tapering course as directed. 6-5-4-3-2-1 21 tablet 0  . traMADol (ULTRAM) 50 MG tablet Take 50 mg by mouth every 4 (four) hours as needed for moderate pain.   0  . TRINTELLIX 5 MG TABS Take 5 mg by mouth daily.   2   No current facility-administered medications for this visit.     Allergies  Allergen Reactions  . Crestor [Rosuvastatin Calcium] Other (See Comments)    Difficulty swallowing  . Lipitor [Atorvastatin Calcium] Other (See Comments)    Muscle spasms  . Lisinopril Cough  . Tricor [Fenofibrate] Other (See Comments)    Unknown    Social History   Social History  . Marital status: Married    Spouse name: N/A  . Number of children: N/A  . Years of education: N/A   Occupational History  . Not on file.   Social History Main Topics  . Smoking status: Former Smoker    Packs/day: 3.00    Years: 30.00    Types: Cigarettes    Quit date: 01/29/2001  . Smokeless tobacco: Never Used  . Alcohol use 4.2 oz/week    7 Cans of beer per week  . Drug use: No  . Sexual activity: Yes   Other Topics Concern  . Not on file   Social History Narrative  . No narrative on file     Review of Systems: General: negative for chills, fever, night sweats or weight changes.  Cardiovascular:  negative for chest pain, dyspnea on exertion, edema, orthopnea, palpitations, paroxysmal nocturnal dyspnea or shortness of breath Dermatological: negative for rash Respiratory: negative for cough or wheezing Urologic: negative for hematuria Abdominal: negative for nausea, vomiting, diarrhea, bright red blood per rectum, melena, or hematemesis Neurologic: negative for visual changes, syncope, or dizziness All other systems reviewed and are otherwise negative except as noted above.    Blood pressure 109/65, pulse 69, height 5\' 7"  (1.702 m), weight 165 lb 3.2 oz (74.9 kg).  General appearance: alert and no distress Neck: no adenopathy, no JVD, supple, symmetrical, trachea midline, thyroid not enlarged, symmetric, no tenderness/mass/nodules and Bilateral carotid bruits right ladder the left Lungs: clear to auscultation bilaterally Heart: regular rate and rhythm, S1, S2 normal, no murmur, click, rub or gallop Extremities: extremities normal,  atraumatic, no cyanosis or edema  EKG not performed today  ASSESSMENT AND PLAN:   S/P angioplasty with stent 06/14/16 PCI & DES of high grade AV groove LCX and 1st OM  Mr. Walmsley returns today after his recent circumflex stent procedure which I performed 06/14/16. He had bypass grafting X 5 by Dr. Roxan Hockey 06/16/08 with a free LIMA to his LAD is a also a sequential vein to the first diagonal branch, first and second marginal branches as well as the PDA. Because of progressive dyspnea on exertion I obtained a Myoview stress test on 03/29/16 that showed a large reversible lateral wall perfusion abnormality consistent with ischemia. He underwent outpatient cardiac catheterization 05/14/16 revealing severe native and graft vessel disease with normal LV function. Specifically, his RCA vein graft was occluded to a occluded dominant RCA and a surgical functioning graft which was sequential was occluded at the aorta with a patent continuation between OM1 and 2. He did have  native circumflex disease as well. His LIMA was intact. I reviewed his anatomy with Dr. Roxan Hockey and the consensus was to pursue coronary artery stenting as an initial strategy. I perform this on 06/14/16 and implanted a 2.25 mm x 30 mm long synergy drug-eluting stent. The procedure was uncomplicated. He was discharged on the following day. His symptoms have significantly improved. He remains on dual antiplatelet therapy with Plavix.      [name] MD FACP,FACC,FAHA, [facility] 06/29/2016 9:07 AM

## 2016-06-29 NOTE — Patient Instructions (Signed)
Medication Instructions:  NO CHANGES.   Follow-Up: Your physician wants you to follow-up in: 6 MONTHS WITH DR BERRY. You will receive a reminder letter in the mail two months in advance. If you don't receive a letter, please call our office to schedule the follow-up appointment.  If you need a refill on your cardiac medications before your next appointment, please call your pharmacy.     

## 2016-06-29 NOTE — Assessment & Plan Note (Signed)
Mr. Menon returns today after his recent circumflex stent procedure which I performed 06/14/16. He had bypass grafting X 5 by Dr. Roxan Hockey 06/16/08 with a free LIMA to his LAD is a also a sequential vein to the first diagonal branch, first and second marginal branches as well as the PDA. Because of progressive dyspnea on exertion I obtained a Myoview stress test on 03/29/16 that showed a large reversible lateral wall perfusion abnormality consistent with ischemia. He underwent outpatient cardiac catheterization 05/14/16 revealing severe native and graft vessel disease with normal LV function. Specifically, his RCA vein graft was occluded to a occluded dominant RCA and a surgical functioning graft which was sequential was occluded at the aorta with a patent continuation between OM1 and 2. He did have native circumflex disease as well. His LIMA was intact. I reviewed his anatomy with Dr. Roxan Hockey and the consensus was to pursue coronary artery stenting as an initial strategy. I perform this on 06/14/16 and implanted a 2.25 mm x 30 mm long synergy drug-eluting stent. The procedure was uncomplicated. He was discharged on the following day. His symptoms have significantly improved. He remains on dual antiplatelet therapy with Plavix.

## 2016-07-04 ENCOUNTER — Telehealth: Payer: Self-pay | Admitting: *Deleted

## 2016-07-04 NOTE — Telephone Encounter (Signed)
[facility] at [facility] for Cardiac Rehab request received and signed by Dr Gwenlyn Found for referral.  ICD code I25.10 used. EKG faxed with signed form to [phone].

## 2016-07-09 MED FILL — METOPROLOL SUCC ER 25 MG TA: 25 | 90 days supply | Qty: 90 | Fill #1

## 2016-07-17 MED FILL — LORazepam 1 MG TABS: 1 | 90 days supply | Qty: 360 | Fill #0

## 2016-07-25 MED FILL — CIALIS 20 MG TABLET: 20 | 30 days supply | Qty: 6 | Fill #9

## 2016-07-25 MED FILL — PRALUENT 150 MG/ML PEN: 150 | 30 days supply | Qty: 2 | Fill #1

## 2016-07-25 MED FILL — LOSARTAN POTASSIUM 50 MG TA: 50 | 90 days supply | Qty: 90 | Fill #1

## 2016-08-08 MED FILL — CLOPIDOGREL 75 MG TABLET: 75 | 90 days supply | Qty: 90 | Fill #1

## 2016-08-08 MED FILL — TRINTELLIX 5 MG TABLET: 5 | 30 days supply | Qty: 90 | Fill #1

## 2016-08-21 MED FILL — PRALUENT 150 MG/ML PEN: 150 | 28 days supply | Qty: 2 | Fill #2

## 2016-08-22 MED FILL — DEXTROAMP-AMP 30 MG TABLET: 30 | 90 days supply | Qty: 270 | Fill #0

## 2016-09-07 ENCOUNTER — Other Ambulatory Visit: Payer: Self-pay | Admitting: Internal Medicine

## 2016-09-07 MED FILL — AMLODIPINE BESYLATE 10 MG T: 10 | 90 days supply | Qty: 90 | Fill #0

## 2016-09-07 MED FILL — LEVOTHYROXINE 75 MCG TABLET: 75 | 90 days supply | Qty: 90 | Fill #0

## 2016-09-07 MED FILL — TRINTELLIX 5 MG TABLET: 5 | 30 days supply | Qty: 90 | Fill #2

## 2016-09-07 MED FILL — PANTOPRAZOLE SOD DR 40 MG T: 40 | 90 days supply | Qty: 90 | Fill #1

## 2016-09-07 MED FILL — CIALIS 20 MG TABLET: 20 | 30 days supply | Qty: 6 | Fill #10

## 2016-09-14 MED FILL — PRALUENT 150 MG/ML PEN: 150 | 28 days supply | Qty: 2 | Fill #3

## 2016-09-18 DIAGNOSIS — H52223 Regular astigmatism, bilateral: Secondary | ICD-10-CM | POA: Diagnosis not present

## 2016-09-18 DIAGNOSIS — H524 Presbyopia: Secondary | ICD-10-CM | POA: Diagnosis not present

## 2016-09-18 DIAGNOSIS — H5203 Hypermetropia, bilateral: Secondary | ICD-10-CM | POA: Diagnosis not present

## 2016-09-21 ENCOUNTER — Encounter (HOSPITAL_COMMUNITY): Payer: Self-pay | Admitting: Cardiovascular Disease

## 2016-10-12 MED FILL — PRALUENT 150 MG/ML PEN: 150 | 28 days supply | Qty: 2 | Fill #4

## 2016-10-15 MED FILL — LOSARTAN POTASSIUM 50 MG TA: 50 | 90 days supply | Qty: 90 | Fill #2

## 2016-10-16 ENCOUNTER — Other Ambulatory Visit: Payer: Self-pay | Admitting: Internal Medicine

## 2016-10-16 MED FILL — METOPROLOL SUCC ER 25 MG TA: 25 | 90 days supply | Qty: 90 | Fill #2

## 2016-10-24 ENCOUNTER — Emergency Department (HOSPITAL_BASED_OUTPATIENT_CLINIC_OR_DEPARTMENT_OTHER): Payer: 59

## 2016-10-24 ENCOUNTER — Emergency Department (HOSPITAL_BASED_OUTPATIENT_CLINIC_OR_DEPARTMENT_OTHER)
Admission: EM | Admit: 2016-10-24 | Discharge: 2016-10-24 | Disposition: A | Discharge status: Home / Self Care | Payer: 59 | Attending: Emergency Medicine | Admitting: Emergency Medicine

## 2016-10-24 ENCOUNTER — Encounter (HOSPITAL_BASED_OUTPATIENT_CLINIC_OR_DEPARTMENT_OTHER): Payer: Self-pay

## 2016-10-24 DIAGNOSIS — M542 Cervicalgia: Secondary | ICD-10-CM | POA: Insufficient documentation

## 2016-10-24 DIAGNOSIS — F419 Anxiety disorder, unspecified: Secondary | ICD-10-CM | POA: Insufficient documentation

## 2016-10-24 DIAGNOSIS — Z85810 Personal history of malignant neoplasm of tongue: Secondary | ICD-10-CM | POA: Insufficient documentation

## 2016-10-24 DIAGNOSIS — R002 Palpitations: Secondary | ICD-10-CM

## 2016-10-24 DIAGNOSIS — I25810 Atherosclerosis of coronary artery bypass graft(s) without angina pectoris: Secondary | ICD-10-CM | POA: Insufficient documentation

## 2016-10-24 DIAGNOSIS — Z87891 Personal history of nicotine dependence: Secondary | ICD-10-CM | POA: Diagnosis not present

## 2016-10-24 DIAGNOSIS — C029 Malignant neoplasm of tongue, unspecified: Secondary | ICD-10-CM | POA: Diagnosis not present

## 2016-10-24 DIAGNOSIS — J449 Chronic obstructive pulmonary disease, unspecified: Secondary | ICD-10-CM | POA: Insufficient documentation

## 2016-10-24 DIAGNOSIS — R011 Cardiac murmur, unspecified: Secondary | ICD-10-CM | POA: Diagnosis not present

## 2016-10-24 DIAGNOSIS — E039 Hypothyroidism, unspecified: Secondary | ICD-10-CM | POA: Insufficient documentation

## 2016-10-24 DIAGNOSIS — I10 Essential (primary) hypertension: Secondary | ICD-10-CM | POA: Diagnosis not present

## 2016-10-24 DIAGNOSIS — Z7982 Long term (current) use of aspirin: Secondary | ICD-10-CM | POA: Diagnosis not present

## 2016-10-24 DIAGNOSIS — Z79899 Other long term (current) drug therapy: Secondary | ICD-10-CM | POA: Diagnosis not present

## 2016-10-24 DIAGNOSIS — I499 Cardiac arrhythmia, unspecified: Secondary | ICD-10-CM | POA: Diagnosis not present

## 2016-10-24 LAB — COMPREHENSIVE METABOLIC PANEL
ALBUMIN: 4.4 g/dL (ref 3.5–5.0)
ALK PHOS: 55 U/L (ref 38–126)
ALT: 38 U/L (ref 17–63)
AST: 34 U/L (ref 15–41)
Anion gap: 9 (ref 5–15)
BILIRUBIN TOTAL: 0.5 mg/dL (ref 0.3–1.2)
BUN: 19 mg/dL (ref 6–20)
CALCIUM: 9.2 mg/dL (ref 8.9–10.3)
CO2: 27 mmol/L (ref 22–32)
CREATININE: 1.09 mg/dL (ref 0.61–1.24)
Chloride: 103 mmol/L (ref 101–111)
GFR calc Af Amer: >60 mL/min (ref >60)
GLUCOSE: 113 mg/dL — AB (ref 65–99)
POTASSIUM: 3.8 mmol/L (ref 3.5–5.1)
Sodium: 139 mmol/L (ref 135–145)
TOTAL PROTEIN: 7.3 g/dL (ref 6.5–8.1)

## 2016-10-24 LAB — URINALYSIS, ROUTINE W REFLEX MICROSCOPIC
BILIRUBIN URINE: NEGATIVE
Glucose, UA: NEGATIVE mg/dL
HGB URINE DIPSTICK: NEGATIVE
KETONES UR: NEGATIVE mg/dL
Leukocytes, UA: NEGATIVE
NITRITE: NEGATIVE
PROTEIN: NEGATIVE mg/dL
Specific Gravity, Urine: 1.004 — ABNORMAL LOW (ref 1.005–1.030)
pH: 6 (ref 5.0–8.0)

## 2016-10-24 LAB — CBC WITH DIFFERENTIAL/PLATELET
BASOS ABS: 0 10*3/uL (ref 0.0–0.1)
BASOS PCT: 1 %
Eosinophils Absolute: 0.2 10*3/uL (ref 0.0–0.7)
Eosinophils Relative: 3 %
HEMATOCRIT: 43.2 % (ref 39.0–52.0)
HEMOGLOBIN: 14.8 g/dL (ref 13.0–17.0)
LYMPHS PCT: 10 %
Lymphs Abs: 0.5 10*3/uL — ABNORMAL LOW (ref 0.7–4.0)
MCH: 28.8 pg (ref 26.0–34.0)
MCHC: 34.3 g/dL (ref 30.0–36.0)
MCV: 84.2 fL (ref 78.0–100.0)
MONO ABS: 0.6 10*3/uL (ref 0.1–1.0)
MONOS PCT: 11 %
NEUTROS ABS: 3.8 10*3/uL (ref 1.7–7.7)
NEUTROS PCT: 75 %
Platelets: 157 10*3/uL (ref 150–400)
RBC: 5.13 MIL/uL (ref 4.22–5.81)
RDW: 14.9 % (ref 11.5–15.5)
WBC: 5.1 10*3/uL (ref 4.0–10.5)

## 2016-10-24 LAB — BRAIN NATRIURETIC PEPTIDE: B NATRIURETIC PEPTIDE 5: 36.3 pg/mL (ref 0.0–100.0)

## 2016-10-24 LAB — TROPONIN I
TROPONIN I: 0.03 ng/mL — AB (ref <0.03)
Troponin I: 0.03 ng/mL (ref <0.03)

## 2016-10-24 LAB — D-DIMER, QUANTITATIVE (NOT AT ARMC): D DIMER QUANT: 0.75 ug{FEU}/mL — AB (ref 0.00–0.50)

## 2016-10-24 MED ORDER — IOPAMIDOL (ISOVUE-370) INJECTION 76%
100.0000 mL | Freq: Once | INTRAVENOUS | Status: AC | PRN
  Administered 2016-10-24: 100 mL via INTRAVENOUS

## 2016-10-24 MED ORDER — SODIUM CHLORIDE 0.9 % IV BOLUS (SEPSIS)
500.0000 mL | Freq: Once | INTRAVENOUS | Status: AC
  Administered 2016-10-24: 500 mL via INTRAVENOUS

## 2016-10-24 NOTE — ED Notes (Signed)
Pt on the monitor 

## 2016-10-24 NOTE — ED Notes (Signed)
ED Provider at bedside discussing test results and dispo plan of care. 

## 2016-10-24 NOTE — Discharge Instructions (Signed)
Call and make an appointment to follow-up with your cardiologist. Return immediately for chest pain, worsening palpation or for any concerns.

## 2016-10-24 NOTE — ED Triage Notes (Signed)
C/o irregular HR since 2pm-denies pain-NAD-steady gait

## 2016-10-24 NOTE — ED Provider Notes (Signed)
[facility] DEPT [facility] Provider Note   CSN: [id number] Arrival date & time: 10/24/16  1706     History   Chief Complaint Chief Complaint  Patient presents with  . Irregular Heart Beat    HPI [name] is a 66 y.o. male.  HPI Patient presents with acute onset palpitations, diaphoresis and lightheadedness starting at 2 PM this afternoon. States the symptoms improved though still has palpitations. Describes palpitations as irregular heartbeats. States he took an Ativan with some improvement. Denies any chest pain. No shortness of breath. No new lower extremity swelling or pain. Denies any recent medication changes or missing any doses. No fever or chills. No cough.  Past Medical History:  Diagnosis Date  . Anxiety   . Arthritis    "NECK, HANDS, KNEES" (06/14/2016)  . CAD (coronary artery disease)   . Carotid artery stenosis   . COPD (chronic obstructive pulmonary disease) ([place]) 12/11/2011   r/s mv - EF 72%; exercise capacity 13 METS; no exercised induced ischemic EKG changes  . Depression, major   . GERD (gastroesophageal reflux disease)   . HTN (hypertension), benign 04/16/2008   echo - EF >55%; no regional wall or valvular abnormalities  . Hyperlipidemia    statin intolerant  . Hypothyroidism   . Migraine    "crippling headaches as a kid; silent migraines now, no pain, couple times/week" (06/14/2016)  . S/P angioplasty with stent 06/14/16 PCI & DES of high grade AV groove LCX and 1st OM  06/15/2016  . Squamous cell cancer of tongue ([place]) ~ 2002   "35 radiation treatments at [facility]  . Subclavian steal syndrome 06/17/2012   carotid doppler - R systolic brachial pressure 127mmHg, L 111mmHg; R subclavian artery - proxmial obsstruction w/ abnormal monophasic waveforms, R ECA known occlusive disease; L ECA narrowing w/ 70-99% diameter reductiona  . TIA (transient ischemic attack) 2005  . Viral hepatitis 1970s   "non specific"    Patient Active Problem List   Diagnosis  Date Noted  . S/P angioplasty with stent 06/14/16 PCI & DES of high grade AV groove LCX and 1st OM  06/15/2016  . CAD (coronary artery disease) 06/14/2016  . Abnormal stress test   . Carotid artery disease ([place]) 05/20/2013  . Hypothyroidism 03/29/2012  . Erectile dysfunction 07/02/2011  . Anxiety 07/02/2011  . Back pain 07/02/2011  . Cervical disc disease 07/02/2011  . Coronary artery disease 04/29/2011  . Squamous cell carcinoma of tongue ([place]) 04/29/2011  . Hypertension 04/29/2011  . Depression 04/29/2011  . GE reflux 04/29/2011  . Internal carotid artery stenosis 04/29/2011  . Ptosis of eyelid, left 04/29/2011  . Hyperlipidemia 04/29/2011  . History of ischemic vertebrobasilar artery thalamic stroke 04/29/2011  . Attention deficit disorder 04/29/2011  . Degenerative joint disease of cervical spine 04/29/2011    Past Surgical History:  Procedure Laterality Date  . BACK SURGERY    . BIOPSY TONGUE  ~ 2002   "base of tongue and uvula"  . CARDIAC CATHETERIZATION  04/26/2008   L circumflex 99% stenosed midportion straddling a marginal branch; RCA total priximally w/ grade 2 L to R collaterals; renal arteries widely patent; LAD 30% segmental proximal stenosis  . CARDIAC CATHETERIZATION N/A 05/14/2016   Procedure: Left Heart Cath and Cors/Grafts Angiography;  Surgeon: [name], MD;  Location: [facility];  Service: Cardiovascular;  Laterality: N/A;  . CARDIAC CATHETERIZATION N/A 06/14/2016   Procedure: Coronary Stent Intervention;  Surgeon: [name], MD;  Location:  [facility];  Service: Cardiovascular;  Laterality: N/A;  . [place]   [place]  . CORONARY ANGIOPLASTY WITH STENT PLACEMENT    . CORONARY ARTERY BYPASS GRAFT  2009   x5; LIMA to LAd, sequential vein to first diagonal branch, first and second obtuse marginal branches; vein to PDA  . SHOULDER ARTHROSCOPY WITH SUBACROMIAL DECOMPRESSION AND OPEN ROTATOR C Left 09/28/2013    Procedure: LEFT SHOULDER ARTHROSCOPY WITH SUBACROMIAL DECOMPRESSION AND MINI OPEN ROTATOR CUFF REPAIR, OPEN DISTAL CLAVICLE RESECTION AND OPEN BICEP TENDODESIS ;  Surgeon: Augustin Schooling, MD;  Location: [facility];  Service: Orthopedics;  Laterality: Left;       Home Medications    Prior to Admission medications   Medication Sig Start Date End Date Taking? Authorizing Provider  amLODipine (NORVASC) 10 MG tablet TAKE 1 TABLET BY MOUTH DAILY 09/07/16   [name], MD  amphetamine-dextroamphetamine (ADDERALL) 30 MG tablet Take 30 mg by mouth 3 (three) times daily.      Historical Provider, MD  aspirin 81 MG tablet Take 81 mg by mouth daily.    Historical Provider, MD  CIALIS 20 MG tablet TAKE 1 TABLET BY MOUTH DAILY AS NEEDED FOR ERECTILE DYSFUNCTION 10/17/16   [name], MD  clopidogrel (PLAVIX) 75 MG tablet Take 1 tablet (75 mg total) by mouth daily. 05/18/16   [name], MD  hydrochlorothiazide (MICROZIDE) 12.5 MG capsule Take 1 capsule (12.5 mg total) by mouth daily. 04/17/16   [name], MD  HYDROcodone-acetaminophen (NORCO) 10-325 MG tablet Take 1 tablet by mouth 2 (two) times daily. 06/28/16   [name], MD  ibuprofen (ADVIL,MOTRIN) 200 MG tablet Take 200 mg by mouth every 8 (eight) hours as needed for moderate pain.     Historical Provider, MD  levofloxacin (LEVAQUIN) 500 MG tablet Take 1 tablet (500 mg total) by mouth daily. 06/28/16   [name], MD  levothyroxine (SYNTHROID, LEVOTHROID) 75 MCG tablet TAKE 1 TABLET (75 MCG TOTAL) BY MOUTH DAILY. 09/07/16   [name], MD  LORazepam (ATIVAN) 1 MG tablet Take 1 mg by mouth every 8 (eight) hours as needed for anxiety.     Historical Provider, MD  losartan (COZAAR) 50 MG tablet Take 50 mg by mouth daily.  03/23/16   Historical Provider, MD  losartan (COZAAR) 50 MG tablet TAKE 1 TABLET (50 MG TOTAL) BY MOUTH DAILY. 06/25/16   [name], MD  metoprolol succinate (TOPROL-XL) 25 MG 24 hr tablet TAKE 1 TABLET (25 MG  TOTAL) BY MOUTH DAILY. 04/09/16   [name], MD  omega-3 acid ethyl esters (LOVAZA) 1 G capsule Take 2 capsules (2 g total) by mouth 2 (two) times daily. 04/29/15   [name], MD  pantoprazole (PROTONIX) 40 MG tablet Take 1 tablet (40 mg total) by mouth daily. 06/16/16   Isaiah Serge, NP  PRALUENT 150 MG/ML SOPN INJECT 150 MG INTO THE SKIN EVERY 14 DAYS. 06/28/16   [name], MD  predniSONE (DELTASONE) 10 MG tablet Take in tapering course as directed. 6-5-4-3-2-1 06/28/16   [name], MD  traMADol (ULTRAM) 50 MG tablet Take 50 mg by mouth every 4 (four) hours as needed for moderate pain.  03/19/16   Historical Provider, MD  TRINTELLIX 5 MG TABS Take 5 mg by mouth daily.  11/11/15   Historical Provider, MD    Family History Family History  Problem Relation Age of Onset  .  Cancer Mother     ovarian  . Stroke Father   . Diabetes Father   . Arthritis Father   . Hypertension Brother     Social History Social History  Substance Use Topics  . Smoking status: Former Smoker    Packs/day: 3.00    Years: 30.00    Types: Cigarettes    Quit date: 01/29/2001  . Smokeless tobacco: Never Used  . Alcohol use Yes     Comment: daily     Allergies   Crestor [rosuvastatin calcium]; Lipitor [atorvastatin calcium]; Lisinopril; and Tricor [fenofibrate]   Review of Systems Review of Systems  Constitutional: Positive for diaphoresis. Negative for chills and fever.  Respiratory: Negative for chest tightness and shortness of breath.   Cardiovascular: Positive for palpitations. Negative for chest pain and leg swelling.  Gastrointestinal: Negative for abdominal pain, diarrhea, nausea and vomiting.  Genitourinary: Negative for dysuria, flank pain and frequency.  Musculoskeletal: Positive for neck pain. Negative for back pain, myalgias and neck stiffness.  Skin: Negative for rash and wound.  Neurological: Positive for dizziness and light-headedness. Negative for syncope, weakness,  numbness and headaches.  Psychiatric/Behavioral: The patient is nervous/anxious.   All other systems reviewed and are negative.    Physical Exam Updated Vital Signs BP 183/87   Pulse 83   Temp 97.6 F (36.4 C) (Oral)   Resp 21   Ht 5\' 7"  (1.702 m)   Wt 160 lb (72.6 kg)   SpO2 95%   BMI 25.06 kg/m   Physical Exam  Constitutional: He is oriented to person, place, and time. He appears well-developed and well-nourished. No distress.  HENT:  Head: Normocephalic and atraumatic.  Mouth/Throat: Oropharynx is clear and moist. No oropharyngeal exudate.  Eyes: EOM are normal. Pupils are equal, round, and reactive to light.  Neck: Normal range of motion. Neck supple. JVD (on the right) present.  Cardiovascular: Normal rate and regular rhythm.   Murmur heard. Pulmonary/Chest: Effort normal and breath sounds normal. No respiratory distress. He has no wheezes. He has no rales. He exhibits no tenderness.  Abdominal: Soft. Bowel sounds are normal. There is no tenderness. There is no rebound and no guarding.  Musculoskeletal: Normal range of motion. He exhibits no edema or tenderness.  No lower extremity swelling, asymmetry or tenderness.  Neurological: He is alert and oriented to person, place, and time.  Moving all extremities without deficit. Sensation is fully intact.  Skin: Skin is warm and dry. No rash noted. No erythema.  Psychiatric: His behavior is normal.  Anxious appearing  Nursing note and vitals reviewed.    ED Treatments / Results  Labs (all labs ordered are listed, but only abnormal results are displayed) Labs Reviewed  CBC WITH DIFFERENTIAL/PLATELET - Abnormal; Notable for the following:       Result Value   Lymphs Abs 0.5 (*)    All other components within normal limits  COMPREHENSIVE METABOLIC PANEL - Abnormal; Notable for the following:    Glucose, Bld 113 (*)    All other components within normal limits  TROPONIN I - Abnormal; Notable for the following:     Troponin I 0.03 (*)    All other components within normal limits  URINALYSIS, ROUTINE W REFLEX MICROSCOPIC - Abnormal; Notable for the following:    Specific Gravity, Urine 1.004 (*)    All other components within normal limits  D-DIMER, QUANTITATIVE (NOT AT [facility]) - Abnormal; Notable for the following:    D-Dimer, Quant 0.75 (*)  All other components within normal limits  TROPONIN I - Abnormal; Notable for the following:    Troponin I 0.03 (*)    All other components within normal limits  BRAIN NATRIURETIC PEPTIDE    EKG  EKG Interpretation  Date/Time:  Wednesday October 24 2016 17:21:57 EST Ventricular Rate:  99 PR Interval:  158 QRS Duration: 134 QT Interval:  392 QTC Calculation: 503 R Axis:   -89 Text Interpretation:  Sinus rhythm with Premature atrial complexes Left axis deviation Right bundle branch block Possible Lateral infarct , age undetermined Abnormal ECG Confirmed by Lita Mains  MD, Sharolyn Weber ([id number]) on 10/24/2016 5:46:41 PM       Radiology Dg Chest 2 View  Result Date: 10/24/2016 CLINICAL DATA:  Irregular heartbeat, pt had bypass surgery in 2009, this is the first episode of palpitations since then. EXAM: CHEST  2 VIEW COMPARISON:  05/10/2016 FINDINGS: There is no focal parenchymal opacity. There is no pleural effusion or pneumothorax. The heart and mediastinal contours are unremarkable. There is evidence of prior CABG. The osseous structures are unremarkable. IMPRESSION: No active cardiopulmonary disease. Electronically Signed   By: Kathreen Devoid   On: 10/24/2016 19:18   Ct Angio Chest Pe W And/or Wo Contrast  Result Date: 10/24/2016 CLINICAL DATA:  Palpitations. EXAM: CT ANGIOGRAPHY CHEST WITH CONTRAST TECHNIQUE: Multidetector CT imaging of the chest was performed using the standard protocol during bolus administration of intravenous contrast. Multiplanar CT image reconstructions and MIPs were obtained to evaluate the vascular anatomy. CONTRAST:  100 cc Isovue 370  COMPARISON:  None. FINDINGS: Cardiovascular: Heart is enlarged. Patient is status post CABG. No thoracic aortic aneurysm. No filling defects are identified within the opacified pulmonary arteries to suggest the presence of an acute pulmonary embolus. Mediastinum/Nodes: No mediastinal lymphadenopathy. There is no hilar lymphadenopathy. The esophagus has normal imaging features. There is no axillary lymphadenopathy. Lungs/Pleura: No focal airspace consolidation. No pulmonary edema or pleural effusion. No suspicious pulmonary nodule or mass. Upper Abdomen: Layering tiny calcified gallstones noted. 8 mm right adrenal nodule compatible with adenoma. Musculoskeletal: Bone windows reveal no worrisome lytic or sclerotic osseous lesions. Review of the MIP images confirms the above findings. IMPRESSION: 1. No CT evidence for acute pulmonary embolus. 2. No acute findings in the chest. Electronically Signed   By: Misty Stanley M.D.   On: 10/24/2016 20:19    Procedures Procedures (including critical care time)  Medications Ordered in ED Medications  sodium chloride 0.9 % bolus 500 mL (0 mLs Intravenous Stopped 10/24/16 1928)  iopamidol (ISOVUE-370) 76 % injection 100 mL (100 mLs Intravenous Contrast Given 10/24/16 1929)     Initial Impression / Assessment and Plan / ED Course  I have reviewed the triage vital signs and the nursing notes.  Pertinent labs & imaging results that were available during my care of the patient were reviewed by me and considered in my medical decision making (see chart for details).    Patient's palpitations have improved. Heart rate has improved. Denies any chest pain. Patient is advised to call make appointment to follow-up with his cardiologist. May need adjustment of beta blocker dosing. Strict return precautions given.   Final Clinical Impressions(s) / ED Diagnoses   Final diagnoses:  Palpitations  Anxiety    New Prescriptions New Prescriptions   No medications on file       Julianne Rice, MD 10/24/16 2250

## 2016-10-24 NOTE — ED Notes (Signed)
Patient denies pain and is resting comfortably.  

## 2016-10-24 NOTE — ED Notes (Signed)
Patient transported to CT 

## 2016-10-25 ENCOUNTER — Other Ambulatory Visit: Payer: Self-pay | Admitting: Cardiovascular Disease

## 2016-10-25 DIAGNOSIS — G458 Other transient cerebral ischemic attacks and related syndromes: Secondary | ICD-10-CM

## 2016-10-25 DIAGNOSIS — I739 Peripheral vascular disease, unspecified: Secondary | ICD-10-CM

## 2016-10-26 ENCOUNTER — Other Ambulatory Visit: Payer: Self-pay

## 2016-10-26 MED ORDER — SILDENAFIL CITRATE 100 MG PO TABS: 50.0000 mg | ORAL_TABLET | Freq: Every day | ORAL | 3 refills | Status: DC | PRN

## 2016-10-26 MED FILL — SILDENAFIL 100 MG TABLET: 100 | 90 days supply | Qty: 18 | Fill #0

## 2016-10-26 MED FILL — LORazepam 1 MG TABS: 1 | 90 days supply | Qty: 360 | Fill #0

## 2016-10-26 MED FILL — guanFACINE HCL ER 1 MG TB24: 1 | 90 days supply | Qty: 90 | Fill #0

## 2016-11-12 ENCOUNTER — Ambulatory Visit (HOSPITAL_COMMUNITY)
Admission: RE | Admit: 2016-11-12 | Discharge: 2016-11-12 | Disposition: A | Discharge status: Home / Self Care | Payer: 59 | Source: Ambulatory Visit | Attending: Cardiovascular Disease | Admitting: Cardiovascular Disease

## 2016-11-12 ENCOUNTER — Inpatient Hospital Stay (HOSPITAL_COMMUNITY): Admission: RE | Admit: 2016-11-12 | Payer: 59 | Source: Ambulatory Visit

## 2016-11-12 DIAGNOSIS — I6523 Occlusion and stenosis of bilateral carotid arteries: Secondary | ICD-10-CM | POA: Diagnosis not present

## 2016-11-12 DIAGNOSIS — I779 Disorder of arteries and arterioles, unspecified: Secondary | ICD-10-CM

## 2016-11-12 DIAGNOSIS — I708 Atherosclerosis of other arteries: Secondary | ICD-10-CM | POA: Diagnosis not present

## 2016-11-12 DIAGNOSIS — I6529 Occlusion and stenosis of unspecified carotid artery: Secondary | ICD-10-CM | POA: Diagnosis present

## 2016-11-12 DIAGNOSIS — I739 Peripheral vascular disease, unspecified: Secondary | ICD-10-CM

## 2016-11-13 MED FILL — HYDROCHLOROTHIAZIDE 12.5 MG: 12.5 | 90 days supply | Qty: 90 | Fill #1

## 2016-11-13 MED FILL — CLOPIDOGREL 75 MG TABLET: 75 | 90 days supply | Qty: 90 | Fill #2

## 2016-11-14 ENCOUNTER — Ambulatory Visit (INDEPENDENT_AMBULATORY_CARE_PROVIDER_SITE_OTHER): Payer: 59 | Admitting: Cardiovascular Disease

## 2016-11-14 ENCOUNTER — Encounter: Payer: Self-pay | Admitting: Cardiovascular Disease

## 2016-11-14 DIAGNOSIS — I10 Essential (primary) hypertension: Secondary | ICD-10-CM | POA: Diagnosis not present

## 2016-11-14 DIAGNOSIS — Z959 Presence of cardiac and vascular implant and graft, unspecified: Secondary | ICD-10-CM | POA: Diagnosis not present

## 2016-11-14 DIAGNOSIS — E7800 Pure hypercholesterolemia, unspecified: Secondary | ICD-10-CM

## 2016-11-14 DIAGNOSIS — Z95820 Peripheral vascular angioplasty status with implants and grafts: Secondary | ICD-10-CM

## 2016-11-14 MED ORDER — METOPROLOL SUCCINATE ER 50 MG PO TB24: 50.0000 mg | ORAL_TABLET | Freq: Every day | ORAL | 3 refills | Status: DC

## 2016-11-14 MED FILL — PRALUENT 150 MG/ML PEN: 150 | 28 days supply | Qty: 2 | Fill #5

## 2016-11-14 MED FILL — METOPROLOL SUCC ER 50 MG TA: 50 | 90 days supply | Qty: 90 | Fill #0

## 2016-11-14 NOTE — Assessment & Plan Note (Signed)
History of hyperlipidemia on Praluent with  lipid profile performed 06/14/16 revealed total cholesterol 126, LDL of 30 and HDL of 63.

## 2016-11-14 NOTE — Assessment & Plan Note (Signed)
History of hypertension blood pressure measured at 177/80. This was taken his left arm. He does have right subclavian or innominate disease. He is on hydrochlorothiazide, amlodipine, losartan and metoprolol. I'm going to increase his metoprolol from 25-50 mg a day.

## 2016-11-14 NOTE — Patient Instructions (Addendum)
Medication Instructions: Increase Metoprolol to 50 mg daily.   Follow-Up: Your physician wants you to follow-up in: 6 months with Dr. Berry. You will receive a reminder letter in the mail two months in advance. If you don't receive a letter, please call our office to schedule the follow-up appointment.  If you need a refill on your cardiac medications before your next appointment, please call your pharmacy.  

## 2016-11-14 NOTE — Assessment & Plan Note (Signed)
History of carotid artery disease with Dopplers performed 2/112/18 revealing moderate right mild to moderate left ICA stenosis. He did have a 50 mm upper extremity blood pressure differential lower than left probably related to right subclavian and/or innominate disease. He is a symptomatic from this. We will recheck his carotid Doppler studies in 1 year.

## 2016-11-14 NOTE — Assessment & Plan Note (Addendum)
History of coronary artery disease status post coronary artery bypass grafting X 5 by Dr. Merilynn Finland 06/16/08 with a free LIMA to his LAD, sequential vein to the first diagonal branch, first and second obtuse marginal branches as well as a vein to the PDA. Because of increasing dyspnea on exertion and a Myoview stress test performed 03/29/16 that showed a large reversible lateral wall perfusion defect consistent with ischemia. I catheterized him 05/14/16 revealing severe native and graft vessel disease with normal LV function. Specifically, his RCA vein graft was occluded as was his dominant RCA and circumflex vein graft which was sequential to 2 marginal branches. I ended up stenting his AV groove circumflex into his obtuse marginal branch with a 2.25 x 30 mm long synergy  drug-eluting stent with an excellent result. His dyspnea ultimately resolved. He remains on dual antiplatelet therapy.

## 2016-11-14 NOTE — Progress Notes (Signed)
11/14/2016 [name]   29-Jun-1951  BX:[id number]  Primary Physician [name], MD Primary Cardiologist: [name] MD [name], [place]  HPI:   The patient is a very pleasant 66 year old, thin-appearing, married Caucasian male, father of 9, grandfather to 4 grandchildren who I last saw 06/29/16. He has a history of coronary artery disease status post bypass grafting x5 by Dr. Merilynn Finland June 16, 2008, with a free LIMA to his LAD, sequential vein to the first diagonal branch, first and second obtuse marginal branches, as well as a vein to the PDA. His other problems include hypertension and hyperlipidemia. He denies chest pain or shortness of breath. I did perform cerebral angiography on him May 05, 2008, because of a question of a high-grade internal carotid artery stenosis read by the radiologist. However, this turned out to be a left external carotid artery stenosis. He does have high-grade ostial left internal mammary artery stenosis which necessitated the use of a free LIMA. He is statin intolerant. 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 A Myoview stress test performed December 11, 2011, was normal and carotid Dopplers did show a moderately severe right ICA stenosis which we have been following by duplex ultrasound. 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 Since I saw him 12 months ago he has noticed increasing dyspnea on exertion over the last several months, especially noticeable while mowing his lawn. I obtained a Myoview stress test on him based on this 03/29/16 that showed a large reversible lateral wall perfusion defect consistent with ischemia.He underwent outpatient cardiac catheterization by myself on 05/14/16 revealing severe  native and graft vessel disease with normal LV function. Specifically, his RCA vein graft was occluded to an occluded dominant right and his circumflex vein graft which was sequential to 2 obtuse marginal branches was occluded at the aorta to patent continuation between OM1 and 2. He did have native circumflex disease. Dr. Roxan Hockey saw him during his brief hospitalization to discuss the possibility of redo coronary artery bypass grafting although the consensus was that he would be better served with attempted PCI and stenting of his native circumflex coronary artery. I perform this electively on 06/14/16 and implanted a 2.25 mm x 38 mm long synergy drug-eluting stent in his AV groove circumflex into his obtuse marginal branch. He had excellent angiographic and clinical result. He was discharged on the following day. His dyspnea ultimately resolved. Since I saw him in the office 6 months ago he's remained clinically stable.   Current Outpatient Prescriptions  Medication Sig Dispense Refill  . amLODipine (NORVASC) 10 MG tablet TAKE 1 TABLET BY MOUTH DAILY 90 tablet 0  . amphetamine-dextroamphetamine (ADDERALL) 30 MG tablet Take 30 mg by mouth 3 (three) times daily.      Marland Kitchen aspirin 81 MG tablet Take 81 mg by mouth daily.    Marland Kitchen CIALIS 20 MG tablet TAKE 1 TABLET BY MOUTH DAILY AS NEEDED FOR ERECTILE DYSFUNCTION 18 tablet 3  . clopidogrel (PLAVIX) 75 MG tablet Take 1 tablet (75 mg total) by mouth daily. 90 tablet 3  . guanFACINE (TENEX) 1 MG tablet Take 1 mg by mouth 2 (two) times daily.    . hydrochlorothiazide (MICROZIDE) 12.5 MG capsule Take 1 capsule (12.5 mg  total) by mouth daily. 90 capsule 1  . ibuprofen (ADVIL,MOTRIN) 200 MG tablet Take 200 mg by mouth every 8 (eight) hours as needed for moderate pain.     Marland Kitchen levothyroxine (SYNTHROID, LEVOTHROID) 75 MCG tablet TAKE 1 TABLET (75 MCG TOTAL) BY MOUTH DAILY. 90 tablet 1  . LORazepam (ATIVAN) 1 MG tablet Take 1 mg by mouth every 8 (eight) hours as needed  for anxiety.     Marland Kitchen losartan (COZAAR) 50 MG tablet TAKE 1 TABLET (50 MG TOTAL) BY MOUTH DAILY. 30 tablet 8  . metoprolol succinate (TOPROL-XL) 50 MG 24 hr tablet Take 1 tablet (50 mg total) by mouth daily. 90 tablet 3  . pantoprazole (PROTONIX) 40 MG tablet Take 1 tablet (40 mg total) by mouth daily. 30 tablet 11  . PRALUENT 150 MG/ML SOPN INJECT 150 MG INTO THE SKIN EVERY 14 DAYS. 2 mL 11  . TRINTELLIX 5 MG TABS Take 5 mg by mouth daily.   2   No current facility-administered medications for this visit.     Allergies  Allergen Reactions  . Crestor [Rosuvastatin Calcium] Other (See Comments)    Difficulty swallowing  . Lipitor [Atorvastatin Calcium] Other (See Comments)    Muscle spasms  . Lisinopril Cough  . Tricor [Fenofibrate] Other (See Comments)    Unknown    Social History   Social History  . Marital status: Married    Spouse name: N/A  . Number of children: N/A  . Years of education: N/A   Occupational History  . Not on file.   Social History Main Topics  . Smoking status: Former Smoker    Packs/day: 3.00    Years: 30.00    Types: Cigarettes    Quit date: 01/29/2001  . Smokeless tobacco: Never Used  . Alcohol use Yes     Comment: daily  . Drug use: No  . Sexual activity: Not on file   Other Topics Concern  . Not on file   Social History Narrative  . No narrative on file     Review of Systems: General: negative for chills, fever, night sweats or weight changes.  Cardiovascular: negative for chest pain, dyspnea on exertion, edema, orthopnea, palpitations, paroxysmal nocturnal dyspnea or shortness of breath Dermatological: negative for rash Respiratory: negative for cough or wheezing Urologic: negative for hematuria Abdominal: negative for nausea, vomiting, diarrhea, bright red blood per rectum, melena, or hematemesis Neurologic: negative for visual changes, syncope, or dizziness All other systems reviewed and are otherwise negative except as noted  above.    Blood pressure (!) 177/80, pulse 80, height 5\' 7"  (1.702 m), weight 165 lb (74.8 kg), SpO2 95 %.  General appearance: alert and no distress Neck: no adenopathy, no JVD, supple, symmetrical, trachea midline, thyroid not enlarged, symmetric, no tenderness/mass/nodules and Soft bilateral carotid bruits and right subclavian bruit Lungs: no adenopathy, no JVD, supple, symmetrical, trachea midline, thyroid not enlarged, symmetric, no tenderness/mass/nodules and Soft bilateral carotid bruits and right subclavian bruit. Heart: regular rate and rhythm, S1, S2 normal, no murmur, click, rub or gallop Extremities: extremities normal, atraumatic, no cyanosis or edema  EKG not performed today  ASSESSMENT AND PLAN:   Hypertension History of hypertension blood pressure measured at 177/80. This was taken his left arm. He does have right subclavian or innominate disease. He is on hydrochlorothiazide, amlodipine, losartan and metoprolol. I'm going to increase his metoprolol from 25-50 mg a day.  Internal carotid artery stenosis History of carotid artery disease with Dopplers performed  2/112/18 revealing moderate right mild to moderate left ICA stenosis. He did have a 50 mm upper extremity blood pressure differential lower than left probably related to right subclavian and/or innominate disease. He is a symptomatic from this. We will recheck his carotid Doppler studies in 1 year.  Hyperlipidemia History of hyperlipidemia on Praluent with  lipid profile performed 06/14/16 revealed total cholesterol 126, LDL of 30 and HDL of 63.  S/P angioplasty with stent 06/14/16 PCI & DES of high grade AV groove LCX and 1st OM  History of coronary artery disease status post coronary artery bypass grafting X 5 by Dr. Merilynn Finland 06/16/08 with a free LIMA to his LAD, sequential vein to the first diagonal branch, first and second obtuse marginal branches as well as a vein to the PDA. Because of increasing dyspnea on  exertion and a Myoview stress test performed 03/29/16 that showed a large reversible lateral wall perfusion defect consistent with ischemia. I catheterized him 05/14/16 revealing severe native and graft vessel disease with normal LV function. Specifically, his RCA vein graft was occluded as was his dominant RCA and circumflex vein graft which was sequential to 2 marginal branches. I ended up stenting his AV groove circumflex into his obtuse marginal branch with a 2.25 x 30 mm long synergy  drug-eluting stent with an excellent result. His dyspnea ultimately resolved. He remains on dual antiplatelet therapy.      [name] MD FACP,FACC,FAHA, [facility] 11/14/2016 3:04 PM

## 2016-11-15 ENCOUNTER — Other Ambulatory Visit: Payer: Self-pay | Admitting: Cardiovascular Disease

## 2016-11-15 ENCOUNTER — Telehealth: Payer: Self-pay | Admitting: Cardiovascular Disease

## 2016-11-15 DIAGNOSIS — E7800 Pure hypercholesterolemia, unspecified: Secondary | ICD-10-CM

## 2016-11-15 DIAGNOSIS — I739 Peripheral vascular disease, unspecified: Principal | ICD-10-CM

## 2016-11-15 DIAGNOSIS — I779 Disorder of arteries and arterioles, unspecified: Secondary | ICD-10-CM

## 2016-11-15 NOTE — Telephone Encounter (Signed)
-----   Message from [name], MD sent at 11/15/2016  3:18 PM EST ----- Get CTA of Ao Arch and inominant/ subclav vessels then ROV to discuss ----- Message ----- From: Therisa Doyne Sent: 11/15/2016   2:31 PM To: [name], MD  Results given to pt. Pt verbalized understanding. Repeat order entered. --Pt stated he has been having aching in his right arm, was not sure if it was from his shoulder. He said he has also had a lot of cramping in his fingers bilaterally.

## 2016-11-15 NOTE — Telephone Encounter (Signed)
Notes Recorded by Therisa Doyne on 11/15/2016 at 4:59 PM EST Gave instructions to pt. Pt stated he is heading out of town 2/17 and will be back either the 25th or 26th and would like to have this done when he returns. Told pt I will mail him the lab slip and have someone call to schedule the CTA and f/u with Dr. Gwenlyn Found sometime tomorrow.

## 2016-11-26 ENCOUNTER — Other Ambulatory Visit: Payer: 59

## 2016-11-26 ENCOUNTER — Other Ambulatory Visit: Payer: Self-pay | Admitting: Cardiovascular Disease

## 2016-11-26 DIAGNOSIS — I739 Peripheral vascular disease, unspecified: Secondary | ICD-10-CM | POA: Diagnosis not present

## 2016-11-26 DIAGNOSIS — I779 Disorder of arteries and arterioles, unspecified: Secondary | ICD-10-CM | POA: Diagnosis not present

## 2016-11-26 DIAGNOSIS — E7800 Pure hypercholesterolemia, unspecified: Secondary | ICD-10-CM | POA: Diagnosis not present

## 2016-11-26 LAB — BASIC METABOLIC PANEL
BUN: 16 mg/dL (ref 7–25)
CO2: 30 mmol/L (ref 20–31)
Calcium: 9.4 mg/dL (ref 8.6–10.3)
Chloride: 104 mmol/L (ref 98–110)
Creat: 1.24 mg/dL (ref 0.70–1.25)
GLUCOSE: 111 mg/dL — AB (ref 65–99)
POTASSIUM: 4.2 mmol/L (ref 3.5–5.3)
Sodium: 141 mmol/L (ref 135–146)

## 2016-11-27 ENCOUNTER — Telehealth: Payer: Self-pay | Admitting: Cardiovascular Disease

## 2016-11-27 NOTE — Telephone Encounter (Deleted)
-----   Message from [name], MD sent at 11/15/2016  3:18 PM EST ----- Get CTA of Ao Arch and inominant/ subclav vessels then ROV to discuss ----- Message ----- From: Therisa Doyne Sent: 11/15/2016   2:31 PM To: [name], MD  Results given to pt. Pt verbalized understanding. Repeat order entered. --Pt stated he has been having aching in his right arm, was not sure if it was from his shoulder. He said he has also had a lot of cramping in his fingers bilaterally.

## 2016-11-27 NOTE — Telephone Encounter (Signed)
Basic metabolic panel  Order: [id number]  Status:  Final result Visible to patient:  Yes (MyChart)  Notes Recorded by Therisa Doyne on 11/27/2016 at 2:01 PM EST Results given to pt. Pt verbalized understanding.  ------  Notes Recorded by [name], MD on 11/27/2016 at 9:35 AM EST Call and tell normal

## 2016-11-27 NOTE — Telephone Encounter (Deleted)
-----   Message from [name], MD sent at 11/27/2016  9:35 AM EST ----- Call and tell normal

## 2016-11-27 NOTE — Telephone Encounter (Signed)
Notes Recorded by Therisa Doyne on 11/27/2016 at 1:56 PM EST Spoke to pt with BMET results and scheduled appt for 12/05/16 with Dr. Gwenlyn Found for f/u CTA.

## 2016-11-29 ENCOUNTER — Ambulatory Visit (INDEPENDENT_AMBULATORY_CARE_PROVIDER_SITE_OTHER)
Admission: RE | Admit: 2016-11-29 | Discharge: 2016-11-29 | Disposition: A | Discharge status: Home / Self Care | Payer: 59 | Source: Ambulatory Visit | Attending: Cardiovascular Disease | Admitting: Cardiovascular Disease

## 2016-11-29 ENCOUNTER — Telehealth: Payer: Self-pay | Admitting: Cardiovascular Disease

## 2016-11-29 DIAGNOSIS — I771 Stricture of artery: Secondary | ICD-10-CM | POA: Diagnosis not present

## 2016-11-29 DIAGNOSIS — I739 Peripheral vascular disease, unspecified: Secondary | ICD-10-CM

## 2016-11-29 MED ORDER — IOPAMIDOL (ISOVUE-370) INJECTION 76%
100.0000 mL | Freq: Once | INTRAVENOUS | Status: AC | PRN
  Administered 2016-11-29: 100 mL via INTRAVENOUS

## 2016-11-29 NOTE — Telephone Encounter (Signed)
Call report to Dr Gwenlyn Found  IMPRESSION: Calcified and noncalcified atherosclerotic plaque with each narrows the proximal right subclavian artery by approximately 70%.  Mild atherosclerotic plaque involving the left subclavian artery although this does not appear to be significantly flow limiting.  Mild atherosclerotic plaque in the distal right subclavian artery without significant stenosis.  These results will be called to the ordering clinician or representative by the Radiologist Assistant, and communication documented in the PACS or zVision Dashboard.  Will forward to Dr Gwenlyn Found for review

## 2016-12-01 NOTE — Telephone Encounter (Signed)
Needs ROV with me to discuss 

## 2016-12-03 DIAGNOSIS — M25511 Pain in right shoulder: Secondary | ICD-10-CM | POA: Diagnosis not present

## 2016-12-03 DIAGNOSIS — G8929 Other chronic pain: Secondary | ICD-10-CM | POA: Diagnosis not present

## 2016-12-03 DIAGNOSIS — Z4889 Encounter for other specified surgical aftercare: Secondary | ICD-10-CM | POA: Diagnosis not present

## 2016-12-03 MED FILL — traMADol HCL 50 MG TABS: 50 | 5 days supply | Qty: 60 | Fill #0

## 2016-12-03 NOTE — Telephone Encounter (Signed)
Patient has follow up ov this week

## 2016-12-05 ENCOUNTER — Ambulatory Visit (INDEPENDENT_AMBULATORY_CARE_PROVIDER_SITE_OTHER): Payer: 59 | Admitting: Cardiovascular Disease

## 2016-12-05 ENCOUNTER — Encounter: Payer: Self-pay | Admitting: Cardiovascular Disease

## 2016-12-05 ENCOUNTER — Other Ambulatory Visit: Payer: Self-pay | Admitting: Internal Medicine

## 2016-12-05 VITALS — BP 160/92 | HR 80 | Ht 67.0 in | Wt 165.0 lb

## 2016-12-05 DIAGNOSIS — I771 Stricture of artery: Secondary | ICD-10-CM

## 2016-12-05 MED FILL — PANTOPRAZOLE SOD DR 40 MG T: 40 | 90 days supply | Qty: 90 | Fill #2

## 2016-12-05 MED FILL — LEVOTHYROXINE 75 MCG TABLET: 75 | 90 days supply | Qty: 90 | Fill #1

## 2016-12-05 MED FILL — TRINTELLIX 5 MG TABLET: 5 | 30 days supply | Qty: 90 | Fill #3

## 2016-12-05 MED FILL — AMLODIPINE BESYLATE 10 MG T: 10 | 90 days supply | Qty: 90 | Fill #0

## 2016-12-05 NOTE — Progress Notes (Signed)
Mr. Puthoff returns today for follow-up of his CTA of his great vessels which showed at least a 70% right subclavian artery stenosis. He is a 50 mm upper extremity pressure gradient. He is somewhat symptomatic. We discussed percutaneous intervention which is considering. I will discuss this at some point prior to the next office visit if he becomes more symptomatic or at the next office visit.

## 2016-12-05 NOTE — Patient Instructions (Addendum)

## 2016-12-10 MED FILL — PRALUENT 150 MG/ML PEN: 150 | 28 days supply | Qty: 2 | Fill #6

## 2016-12-20 MED FILL — AMPHETAMINE SALTS 30 MG TAB: 30 | 30 days supply | Qty: 90 | Fill #0

## 2017-01-01 ENCOUNTER — Other Ambulatory Visit: Payer: 59 | Admitting: Internal Medicine

## 2017-01-01 ENCOUNTER — Other Ambulatory Visit: Payer: Self-pay | Admitting: Internal Medicine

## 2017-01-01 DIAGNOSIS — E039 Hypothyroidism, unspecified: Secondary | ICD-10-CM | POA: Diagnosis not present

## 2017-01-01 DIAGNOSIS — I10 Essential (primary) hypertension: Secondary | ICD-10-CM | POA: Diagnosis not present

## 2017-01-01 DIAGNOSIS — N529 Male erectile dysfunction, unspecified: Secondary | ICD-10-CM

## 2017-01-01 DIAGNOSIS — Z0000 Encounter for general adult medical examination without abnormal findings: Secondary | ICD-10-CM

## 2017-01-01 DIAGNOSIS — E785 Hyperlipidemia, unspecified: Secondary | ICD-10-CM

## 2017-01-01 DIAGNOSIS — Z125 Encounter for screening for malignant neoplasm of prostate: Secondary | ICD-10-CM | POA: Diagnosis not present

## 2017-01-01 LAB — CBC WITH DIFFERENTIAL/PLATELET
BASOS PCT: 2 %
Basophils Absolute: 64 cells/uL (ref 0–200)
EOS PCT: 6 %
Eosinophils Absolute: 192 cells/uL (ref 15–500)
HEMATOCRIT: 46 % (ref 38.5–50.0)
HEMOGLOBIN: 15.5 g/dL (ref 13.2–17.1)
LYMPHS ABS: 672 {cells}/uL — AB (ref 850–3900)
LYMPHS PCT: 21 %
MCH: 29 pg (ref 27.0–33.0)
MCHC: 33.7 g/dL (ref 32.0–36.0)
MCV: 86 fL (ref 80.0–100.0)
MONO ABS: 320 {cells}/uL (ref 200–950)
MPV: 8.6 fL (ref 7.5–12.5)
Monocytes Relative: 10 %
Neutro Abs: 1952 cells/uL (ref 1500–7800)
Neutrophils Relative %: 61 %
Platelets: 185 10*3/uL (ref 140–400)
RBC: 5.35 MIL/uL (ref 4.20–5.80)
RDW: 15.2 % — AB (ref 11.0–15.0)
WBC: 3.2 10*3/uL — ABNORMAL LOW (ref 3.8–10.8)

## 2017-01-01 LAB — COMPLETE METABOLIC PANEL WITH GFR
ALT: 34 U/L (ref 9–46)
AST: 29 U/L (ref 10–35)
Albumin: 4.1 g/dL (ref 3.6–5.1)
Alkaline Phosphatase: 51 U/L (ref 40–115)
BILIRUBIN TOTAL: 0.4 mg/dL (ref 0.2–1.2)
BUN: 15 mg/dL (ref 7–25)
CO2: 28 mmol/L (ref 20–31)
CREATININE: 1.12 mg/dL (ref 0.70–1.25)
Calcium: 9.3 mg/dL (ref 8.6–10.3)
Chloride: 102 mmol/L (ref 98–110)
GFR, EST AFRICAN AMERICAN: 79 mL/min (ref >=60)
GFR, Est Non African American: 69 mL/min (ref >=60)
GLUCOSE: 98 mg/dL (ref 65–99)
Potassium: 4.3 mmol/L (ref 3.5–5.3)
Sodium: 142 mmol/L (ref 135–146)
TOTAL PROTEIN: 6.5 g/dL (ref 6.1–8.1)

## 2017-01-01 LAB — TSH: TSH: 1.75 mIU/L (ref 0.40–4.50)

## 2017-01-01 LAB — LIPID PANEL
Cholesterol: 158 mg/dL (ref <200)
HDL: 81 mg/dL (ref >40)
LDL CALC: 48 mg/dL (ref <100)
TRIGLYCERIDES: 147 mg/dL (ref <150)
Total CHOL/HDL Ratio: 2 Ratio (ref <5.0)
VLDL: 29 mg/dL (ref <30)

## 2017-01-02 LAB — MICROALBUMIN, URINE: MICROALB UR: 2.6 mg/dL

## 2017-01-02 LAB — HEMOGLOBIN A1C
Hgb A1c MFr Bld: 5.5 % (ref <5.7)
MEAN PLASMA GLUCOSE: 111 mg/dL

## 2017-01-02 LAB — PSA: PSA: 3.7 ng/mL (ref <=4.0)

## 2017-01-02 MED FILL — CLINDAMYCIN HCL 150 MG CAPS: 150 | 6 days supply | Qty: 22 | Fill #0

## 2017-01-03 ENCOUNTER — Encounter: Payer: Self-pay | Admitting: Internal Medicine

## 2017-01-03 ENCOUNTER — Ambulatory Visit (INDEPENDENT_AMBULATORY_CARE_PROVIDER_SITE_OTHER): Payer: 59 | Admitting: Internal Medicine

## 2017-01-03 VITALS — BP 170/82 | HR 68 | Ht 66.5 in | Wt 159.0 lb

## 2017-01-03 DIAGNOSIS — Z8673 Personal history of transient ischemic attack (TIA), and cerebral infarction without residual deficits: Secondary | ICD-10-CM | POA: Diagnosis not present

## 2017-01-03 DIAGNOSIS — G902 Horner's syndrome: Secondary | ICD-10-CM | POA: Diagnosis not present

## 2017-01-03 DIAGNOSIS — D0007 Carcinoma in situ of tongue: Secondary | ICD-10-CM

## 2017-01-03 DIAGNOSIS — I6521 Occlusion and stenosis of right carotid artery: Secondary | ICD-10-CM

## 2017-01-03 DIAGNOSIS — Z951 Presence of aortocoronary bypass graft: Secondary | ICD-10-CM | POA: Diagnosis not present

## 2017-01-03 DIAGNOSIS — Z0000 Encounter for general adult medical examination without abnormal findings: Secondary | ICD-10-CM

## 2017-01-03 DIAGNOSIS — M19011 Primary osteoarthritis, right shoulder: Secondary | ICD-10-CM | POA: Diagnosis not present

## 2017-01-03 DIAGNOSIS — D72819 Decreased white blood cell count, unspecified: Secondary | ICD-10-CM

## 2017-01-03 DIAGNOSIS — K219 Gastro-esophageal reflux disease without esophagitis: Secondary | ICD-10-CM | POA: Diagnosis not present

## 2017-01-03 DIAGNOSIS — M5090 Cervical disc disorder, unspecified, unspecified cervical region: Secondary | ICD-10-CM | POA: Diagnosis not present

## 2017-01-03 DIAGNOSIS — I10 Essential (primary) hypertension: Secondary | ICD-10-CM

## 2017-01-03 DIAGNOSIS — Z8659 Personal history of other mental and behavioral disorders: Secondary | ICD-10-CM | POA: Diagnosis not present

## 2017-01-03 DIAGNOSIS — E7849 Other hyperlipidemia: Secondary | ICD-10-CM

## 2017-01-03 DIAGNOSIS — Z789 Other specified health status: Secondary | ICD-10-CM | POA: Diagnosis not present

## 2017-01-03 DIAGNOSIS — E784 Other hyperlipidemia: Secondary | ICD-10-CM

## 2017-01-03 LAB — POCT URINALYSIS DIPSTICK
BILIRUBIN UA: NEGATIVE
Blood, UA: NEGATIVE
GLUCOSE UA: NEGATIVE
Ketones, UA: NEGATIVE
Leukocytes, UA: NEGATIVE
Nitrite, UA: NEGATIVE
PH UA: 6.5 (ref 5.0–8.0)
Protein, UA: NEGATIVE
Spec Grav, UA: 1.015 (ref 1.030–1.035)
Urobilinogen, UA: 0.2 (ref ?–2.0)

## 2017-01-03 MED ORDER — PANTOPRAZOLE SODIUM 40 MG PO TBEC: 40.0000 mg | DELAYED_RELEASE_TABLET | Freq: Every day | ORAL | 3 refills | Status: DC

## 2017-01-03 MED ORDER — LORAZEPAM 1 MG PO TABS: 1.0000 mg | ORAL_TABLET | Freq: Three times a day (TID) | ORAL | 5 refills | Status: DC | PRN

## 2017-01-03 MED ORDER — METHOCARBAMOL 500 MG PO TABS: 500.0000 mg | ORAL_TABLET | Freq: Three times a day (TID) | ORAL | 0 refills | Status: DC

## 2017-01-03 MED FILL — METHOCARBAMOL 500 MG TABLET: 500 | 30 days supply | Qty: 90 | Fill #0

## 2017-01-10 MED FILL — PRALUENT 150 MG/ML PEN: 150 | 28 days supply | Qty: 2 | Fill #7

## 2017-01-15 DIAGNOSIS — M25511 Pain in right shoulder: Secondary | ICD-10-CM | POA: Diagnosis not present

## 2017-01-15 DIAGNOSIS — G8929 Other chronic pain: Secondary | ICD-10-CM | POA: Diagnosis not present

## 2017-01-21 MED FILL — TRINTELLIX 5 MG TABLET: 5 | 30 days supply | Qty: 90 | Fill #4

## 2017-01-22 DIAGNOSIS — L820 Inflamed seborrheic keratosis: Secondary | ICD-10-CM | POA: Diagnosis not present

## 2017-01-22 DIAGNOSIS — L570 Actinic keratosis: Secondary | ICD-10-CM | POA: Diagnosis not present

## 2017-01-22 DIAGNOSIS — Z85828 Personal history of other malignant neoplasm of skin: Secondary | ICD-10-CM | POA: Diagnosis not present

## 2017-01-22 DIAGNOSIS — D225 Melanocytic nevi of trunk: Secondary | ICD-10-CM | POA: Diagnosis not present

## 2017-01-22 DIAGNOSIS — L821 Other seborrheic keratosis: Secondary | ICD-10-CM | POA: Diagnosis not present

## 2017-01-22 MED FILL — AMPHETAMINE SALTS 30 MG TAB: 30 | 30 days supply | Qty: 90 | Fill #0

## 2017-01-22 MED FILL — traMADol HCL 50 MG TABS: 50 | 5 days supply | Qty: 60 | Fill #0

## 2017-01-23 NOTE — Progress Notes (Signed)
Labs drawn

## 2017-01-26 NOTE — Patient Instructions (Signed)
It was a pleasure to see you today. Continue same medications and return in 6 months. Continue close follow-up with Dr. Alvester Chou.

## 2017-01-26 NOTE — Progress Notes (Signed)
Subjective:    Patient ID: [name], male    DOB: 1951/05/06, 66 y.o.   MRN: [id number]  HPI 66 year old Male in today for health maintenance exam and evaluation of medical issues. He has a history of hypertension. Blood pressure is elevated today at 170/82. History of blood pressure differential due to subclavian stenosis. History of hyperlipidemia but is statin intolerant. History of coronary disease followed by Dr. Gwenlyn Found. History of BPH but not a lot of symptoms. He has a history of low white blood cell count in the 3700 range which has been followed for several years and he is asymptomatic.  History of depression, hypothyroidism, status post CABG 5 in 2009, history of smoking, left Horner syndrome, history of stroke, cervical disc disease, essential hypertension, squamous cell carcinoma of the tongue, external carotid artery stenosis on the left, history of migraine headaches, allergic rhinitis. History of attention deficit disorder, cervical disc disease, back pain.  Issues with left rotator cuff but has not been able to have surgery due to cardiac issues. In September 2017 he had PCI and drug-eluting stent of a high-grade AV groove circumflex and first obtuse marginal branch stenosis in the setting of an occluded sequential vein graft with a patent continuation from OM1 - OM 2.   He has a 70% right subclavian artery stenosis. Percutaneous intervention is being considered. He is to return to Dr. Gwenlyn Found in 6 months.   Left shoulder arthroscopy with subacromial decompression and many open rotator cuff repair by Dr. Veverly Fells 2014. Open distal clavicle resection and open biceps tendodesis.  He has a 50 mm upper extremity blood pressure differential  probably related to right subclavian and/or innominate stenosis.  He is statin intolerant but takes Zetia  In January he was seen in the emergency department with palpitations diaphoresis and lightheadedness. D-dimer was elevated at 0.75. He had  a CT chest angioma with contrast showing no evidence of PE. Was not thought to have had an MI and was discharged home.  He recently had CT angio of chest and aorta with and without contrast. He had 70% proximal right subclavian artery stenosis. Mild plaque in left subclavian artery. Mild plaque in distal right subclavian artery.  He has chronic occlusion of the distal right vertebral artery.   History of dissecting of the C-spine around the year 2000.  He developed left eye ptosis after cardiac surgery. He says his face feels numb when he turns his head to the left.  He wears contact lenses.  Colonoscopy by Dr. Collene Mares in 2009.  He had a left thalamic stroke in June 2005. History of numbness of the left cheek and the C3 distribution. Documented facet disease on the left C2-C3, C4-C5, C5-C6 as well as C6-C7 which is aggravated by turning his head.  History of depression, allergic rhinitis, adult attention deficit disorder, BPH, seborrheic dermatitis, erectile dysfunction.  His previous neurologist, Dr. Erling Cruz who is now retired, felt that the patient's carotid disease was due to radiation therapy.  Squamous cell carcinoma of the tongue was treated with radiation therapy in 2002.  Says he had viral hepatitis a number of years ago.  History of GE reflux  Social history: Wife works for [employer]. 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 He smoked for 30 years but quit over 20 years ago. One son from previous marriage. This is second marriage.  He does drink beer.  Family history: Mother died with ovarian cancer. Father died apparently with heart failure but also had history of stroke around age 56 and diabetes. Maternal grandmother, paternal grandmother with history of cancer. He has 2 brothers.    Review of Systems  Constitutional: Negative.   Respiratory: Negative.     Cardiovascular: Negative.   Gastrointestinal: Negative.   Genitourinary: Negative.        Objective:   Physical Exam  Constitutional: He is oriented to person, place, and time. He appears well-developed and well-nourished. No distress.  HENT:  Head: Normocephalic and atraumatic.  Right Ear: External ear normal.  Left Ear: External ear normal.  Mouth/Throat: Oropharynx is clear and moist. No oropharyngeal exudate.  Neck: Neck supple. No JVD present. No thyromegaly present.  Cardiovascular: Normal rate, regular rhythm and normal heart sounds.   Pulmonary/Chest: No respiratory distress. He has no wheezes. He has no rales.  Abdominal: Soft. Bowel sounds are normal. He exhibits no distension and no mass. There is no tenderness. There is no rebound and no guarding.  Genitourinary:  Genitourinary Comments: Prostate slightly enlarged without nodules  Lymphadenopathy:    He has no cervical adenopathy.  Neurological: He is alert and oriented to person, place, and time. He has normal reflexes. No cranial nerve deficit.  Skin: No rash noted. He is not diaphoretic.  Psychiatric: He has a normal mood and affect. His behavior is normal. Judgment and thought content normal.  Vitals reviewed.         Assessment & Plan:  Blood pressure discrepancy and arms due to subclavian artery stenosis-remember to check blood pressure in each arm and a revisit  Status post PCI by Dr. Gwenlyn Found  Hyperlipidemia-stable on Zetia  Essential hypertension  History of coronary disease status post CABG  History of carcinoma base of tongue  Carotid disease  History of cervical disc disease  Hypothyroidism  History of left thalamic stroke 2005  History of left Horner syndrome  History of right vertebral artery occlusion  50% stenosis of right proximal internal carotid artery and severe narrowing of the right external carotid artery  Anxiety depression-stable  History of left shoulder  arthropathy  GE reflux  Plan: Continue same medications and return in 6 months

## 2017-02-06 MED FILL — PRALUENT 150 MG/ML PEN: 150 | 28 days supply | Qty: 2 | Fill #8

## 2017-02-06 MED FILL — LOSARTAN POTASSIUM 50 MG TA: 50 | 60 days supply | Qty: 60 | Fill #3

## 2017-02-26 ENCOUNTER — Other Ambulatory Visit: Payer: Self-pay | Admitting: Cardiovascular Disease

## 2017-02-26 DIAGNOSIS — I10 Essential (primary) hypertension: Secondary | ICD-10-CM

## 2017-02-26 MED FILL — CLOPIDOGREL 75 MG TABLET: 75 | 90 days supply | Qty: 90 | Fill #3

## 2017-02-26 MED FILL — HYDROCHLOROTHIAZIDE 12.5 MG: 12.5 | 90 days supply | Qty: 90 | Fill #0

## 2017-02-27 MED FILL — DEXTROAMP-AMP 30 MG TABLET: 30 | 30 days supply | Qty: 90 | Fill #0

## 2017-03-07 MED FILL — PRALUENT 150 MG/ML PEN: 150 | 28 days supply | Qty: 2 | Fill #9

## 2017-03-11 ENCOUNTER — Other Ambulatory Visit: Payer: Self-pay | Admitting: Internal Medicine

## 2017-03-11 MED FILL — LEVOTHYROXINE 75 MCG TABLET: 75 | 90 days supply | Qty: 90 | Fill #0

## 2017-03-11 MED FILL — PANTOPRAZOLE SOD DR 40 MG T: 40 | 90 days supply | Qty: 90 | Fill #3

## 2017-03-11 MED FILL — TRINTELLIX 5 MG TABLET: 5 | 30 days supply | Qty: 90 | Fill #5

## 2017-03-11 MED FILL — AMLODIPINE BESYLATE 10 MG T: 10 | 90 days supply | Qty: 90 | Fill #0

## 2017-03-12 MED FILL — LORazepam 1 MG TABS: 1 | 90 days supply | Qty: 360 | Fill #0

## 2017-03-22 MED FILL — METOPROLOL SUCC ER 50 MG TA: 50 | 90 days supply | Qty: 90 | Fill #1

## 2017-04-02 MED FILL — AMPHETAMINE SALTS 30 MG TAB: 30 | 30 days supply | Qty: 90 | Fill #0

## 2017-04-02 MED FILL — PRALUENT 150 MG/ML PEN: 150 | 28 days supply | Qty: 2 | Fill #10

## 2017-04-15 ENCOUNTER — Other Ambulatory Visit: Payer: Self-pay | Admitting: Cardiovascular Disease

## 2017-04-16 MED FILL — LOSARTAN POTASSIUM 50 MG TA: 50 | 30 days supply | Qty: 30 | Fill #0

## 2017-04-16 NOTE — Telephone Encounter (Signed)
REFILL 

## 2017-04-24 MED FILL — TRINTELLIX 5 MG TABLET: 5 | 30 days supply | Qty: 90 | Fill #6

## 2017-04-29 DIAGNOSIS — G8929 Other chronic pain: Secondary | ICD-10-CM | POA: Diagnosis not present

## 2017-04-29 DIAGNOSIS — M25511 Pain in right shoulder: Secondary | ICD-10-CM | POA: Diagnosis not present

## 2017-04-29 MED FILL — PRALUENT 150 MG/ML PEN: 150 | 28 days supply | Qty: 2 | Fill #11

## 2017-04-29 MED FILL — traMADol HCL 50 MG TABS: 50 | 5 days supply | Qty: 60 | Fill #0

## 2017-05-02 MED FILL — AMPHETAMINE SALTS 30 MG TAB: 30 | 90 days supply | Qty: 270 | Fill #0

## 2017-05-08 MED FILL — FLUCONAZOLE 150 MG TABLET: 150 | 1 days supply | Qty: 1 | Fill #0

## 2017-05-08 MED FILL — NYSTATIN 100,000 UNITS/ML S: 100000 | 20 days supply | Qty: 200 | Fill #0

## 2017-05-13 MED FILL — LOSARTAN POTASSIUM 50 MG TA: 50 | 90 days supply | Qty: 90 | Fill #1

## 2017-05-14 ENCOUNTER — Ambulatory Visit (HOSPITAL_COMMUNITY): Admission: RE | Admit: 2017-05-14 | Payer: 59 | Source: Ambulatory Visit

## 2017-05-23 ENCOUNTER — Other Ambulatory Visit: Payer: Self-pay | Admitting: Cardiovascular Disease

## 2017-05-23 DIAGNOSIS — E785 Hyperlipidemia, unspecified: Secondary | ICD-10-CM

## 2017-05-24 MED FILL — PRALUENT 150 MG/ML PEN: 150 | 28 days supply | Qty: 2 | Fill #0

## 2017-05-28 ENCOUNTER — Encounter (HOSPITAL_COMMUNITY): Payer: 59

## 2017-05-28 DIAGNOSIS — G8929 Other chronic pain: Secondary | ICD-10-CM | POA: Diagnosis not present

## 2017-05-28 DIAGNOSIS — M19011 Primary osteoarthritis, right shoulder: Secondary | ICD-10-CM | POA: Diagnosis not present

## 2017-05-28 DIAGNOSIS — M25511 Pain in right shoulder: Secondary | ICD-10-CM | POA: Diagnosis not present

## 2017-06-04 ENCOUNTER — Ambulatory Visit (HOSPITAL_COMMUNITY)
Admission: RE | Admit: 2017-06-04 | Discharge: 2017-06-04 | Disposition: A | Discharge status: Home / Self Care | Payer: 59 | Source: Ambulatory Visit | Attending: Cardiovascular Disease | Admitting: Cardiovascular Disease

## 2017-06-04 DIAGNOSIS — I779 Disorder of arteries and arterioles, unspecified: Secondary | ICD-10-CM | POA: Diagnosis not present

## 2017-06-04 DIAGNOSIS — I6523 Occlusion and stenosis of bilateral carotid arteries: Secondary | ICD-10-CM | POA: Diagnosis not present

## 2017-06-04 DIAGNOSIS — I739 Peripheral vascular disease, unspecified: Secondary | ICD-10-CM

## 2017-06-05 ENCOUNTER — Other Ambulatory Visit: Payer: Self-pay | Admitting: Cardiovascular Disease

## 2017-06-05 MED FILL — TRINTELLIX 5 MG TABLET: 5 | 30 days supply | Qty: 90 | Fill #0

## 2017-06-05 MED FILL — HYDROCHLOROTHIAZIDE 12.5 MG: 12.5 | 90 days supply | Qty: 90 | Fill #1

## 2017-06-05 MED FILL — LEVOTHYROXINE 75 MCG TABLET: 75 | 90 days supply | Qty: 90 | Fill #1

## 2017-06-05 MED FILL — PANTOPRAZOLE SOD DR 40 MG T: 40 | 90 days supply | Qty: 90 | Fill #0

## 2017-06-05 MED FILL — CLOPIDOGREL 75 MG TABLET: 75 | 90 days supply | Qty: 90 | Fill #0

## 2017-06-05 MED FILL — AMLODIPINE BESYLATE 10 MG T: 10 | 90 days supply | Qty: 90 | Fill #1

## 2017-06-12 ENCOUNTER — Other Ambulatory Visit: Payer: Self-pay | Admitting: Cardiovascular Disease

## 2017-06-12 DIAGNOSIS — I739 Peripheral vascular disease, unspecified: Principal | ICD-10-CM

## 2017-06-12 DIAGNOSIS — I779 Disorder of arteries and arterioles, unspecified: Secondary | ICD-10-CM

## 2017-06-25 MED FILL — PRALUENT 150 MG/ML PEN: 150 | 28 days supply | Qty: 2 | Fill #1

## 2017-06-26 MED FILL — METOPROLOL SUCC ER 50 MG TA: 50 | 90 days supply | Qty: 90 | Fill #2

## 2017-07-03 ENCOUNTER — Encounter: Payer: Self-pay | Admitting: Cardiovascular Disease

## 2017-07-04 MED FILL — traMADol HCL 50 MG TABS: 50 | 5 days supply | Qty: 60 | Fill #0

## 2017-07-19 ENCOUNTER — Encounter: Payer: Self-pay | Admitting: Cardiovascular Disease

## 2017-07-19 ENCOUNTER — Ambulatory Visit (INDEPENDENT_AMBULATORY_CARE_PROVIDER_SITE_OTHER): Payer: 59 | Admitting: Cardiovascular Disease

## 2017-07-19 DIAGNOSIS — I10 Essential (primary) hypertension: Secondary | ICD-10-CM | POA: Diagnosis not present

## 2017-07-19 DIAGNOSIS — Z23 Encounter for immunization: Secondary | ICD-10-CM

## 2017-07-19 DIAGNOSIS — Z95820 Peripheral vascular angioplasty status with implants and grafts: Secondary | ICD-10-CM

## 2017-07-19 DIAGNOSIS — I6521 Occlusion and stenosis of right carotid artery: Secondary | ICD-10-CM

## 2017-07-19 DIAGNOSIS — E7800 Pure hypercholesterolemia, unspecified: Secondary | ICD-10-CM

## 2017-07-19 DIAGNOSIS — Z959 Presence of cardiac and vascular implant and graft, unspecified: Secondary | ICD-10-CM | POA: Diagnosis not present

## 2017-07-19 NOTE — Assessment & Plan Note (Addendum)
History of essential hypertension blood pressure measured to 170/82 falling to 146/84 by the end of the visit.Marland Kitchen He is on hydrochlorothiazide, losartan and metoprolol. Continue current meds at current dosing.

## 2017-07-19 NOTE — Assessment & Plan Note (Signed)
History of hyperlipidemia on Pralulent with lipid profile performed 01/01/17 with total cholesterol of 158, LDL 48 and HDL of 81.

## 2017-07-19 NOTE — Progress Notes (Signed)
07/19/2017 [name]   November 18, 1950  [id number]  Primary Physician Baxley, Cresenciano Lick, MD Primary Cardiologist: [name] MD [name], [place]  HPI:  [name] is a 66 y.o. male thin-appearing, married Caucasian male, father of 41, grandfather to 4 grandchildren who I last saw 12/05/16. He has a history of coronary artery disease status post bypass grafting x5 by Dr. Merilynn Finland June 16, 2008, with a free LIMA to his LAD, sequential vein to the first diagonal branch, first and second obtuse marginal branches, as well as a vein to the PDA. His other problems include hypertension and hyperlipidemia. He denies chest pain or shortness of breath. I did perform cerebral angiography on him May 05, 2008, because of a question of a high-grade internal carotid artery stenosis read by the radiologist. However, this turned out to be a left external carotid artery stenosis. He does have high-grade ostial left internal mammary artery stenosis which necessitated the use of a free LIMA. He is statin intolerant. 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 A Myoview stress test performed December 11, 2011, was normal and carotid Dopplers did show a moderately severe right ICA stenosis which we have been following by duplex ultrasound. 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 Since I saw him 12 months ago he has noticed increasing dyspnea on exertion over the last several months, especially noticeable while mowing his lawn. I obtained a Myoview stress test on him based on this 03/29/16 that showed a large reversible lateral wall perfusion defect consistent with ischemia.He underwent outpatient cardiac catheterization by myself on 05/14/16 revealing severe native and  graft vessel disease with normal LV function. Specifically, his RCA vein graft was occluded to an occluded dominant right and his circumflex vein graft which was sequential to 2 obtuse marginal branches was occluded at the aorta to patent continuation between OM1 and 2. He did have native circumflex disease. Dr. Roxan Hockey saw him during his brief hospitalization to discuss the possibility of redo coronary artery bypass grafting although the consensus was that he would be better served with attempted PCI and stenting of his native circumflex coronary artery. I perform this electively on 06/14/16 and implanted a 2.25 mm x 38 mm long synergy drug-eluting stent in his AV groove circumflex into his obtuse marginal branch. He had excellent angiographic and clinical result. He was discharged on the following day. His dyspnea ultimately resolved. Since I saw him in the office 7 months ago he's remained clinically stable. He does have a right shoulder orthopedic issue and requires surgical intervention. It has been over a year since his circumflex drug-eluting stent. I feel comfortable stopping his Plavix and clear him for his right shoulder surgical repair.     Current Meds  Medication Sig  . amLODipine (NORVASC) 10 MG tablet TAKE 1 TABLET BY MOUTH DAILY  . amphetamine-dextroamphetamine (ADDERALL) 30 MG tablet Take 30 mg by mouth 3 (three) times daily.  Marland Kitchen aspirin 81 MG tablet Take 81 mg by mouth daily.  . clopidogrel (PLAVIX) 75 MG tablet TAKE 1 TABLET (75 MG TOTAL) BY MOUTH DAILY.  . hydrochlorothiazide (MICROZIDE) 12.5 MG capsule TAKE ONE CAPSULE BY MOUTH DAILY  . levothyroxine (SYNTHROID, LEVOTHROID) 75 MCG tablet TAKE 1 TABLET BY  MOUTH DAILY  . LORazepam (ATIVAN) 1 MG tablet Take 1 tablet (1 mg total) by mouth every 8 (eight) hours as needed for anxiety.  Marland Kitchen losartan (COZAAR) 50 MG tablet TAKE 1 TABLET (50 MG TOTAL) BY MOUTH DAILY.  . metoprolol succinate (TOPROL-XL) 50 MG 24 hr tablet Take 1 tablet (50  mg total) by mouth daily.  . pantoprazole (PROTONIX) 40 MG tablet Take 1 tablet (40 mg total) by mouth daily.  Marland Kitchen PRALUENT 150 MG/ML SOPN INJECT 150 MG INTO THE SKIN EVERY 14 DAYS.  Marland Kitchen traMADol (ULTRAM) 50 MG tablet Take 1 tablet by mouth as directed.  . TRINTELLIX 5 MG TABS Take 5 mg by mouth daily.      Allergies  Allergen Reactions  . Crestor [Rosuvastatin Calcium] Other (See Comments)    Difficulty swallowing  . Lipitor [Atorvastatin Calcium] Other (See Comments)    Muscle spasms  . Lisinopril Cough  . Tricor [Fenofibrate] Other (See Comments)    Unknown    Social History   Social History  . Marital status: Married    Spouse name: N/A  . Number of children: N/A  . Years of education: N/A   Occupational History  . Not on file.   Social History Main Topics  . Smoking status: Former Smoker    Packs/day: 3.00    Years: 30.00    Types: Cigarettes    Quit date: 01/29/2001  . Smokeless tobacco: Never Used  . Alcohol use Yes     Comment: daily  . Drug use: No  . Sexual activity: Not on file   Other Topics Concern  . Not on file   Social History Narrative  . No narrative on file     Review of Systems: General: negative for chills, fever, night sweats or weight changes.  Cardiovascular: negative for chest pain, dyspnea on exertion, edema, orthopnea, palpitations, paroxysmal nocturnal dyspnea or shortness of breath Dermatological: negative for rash Respiratory: negative for cough or wheezing Urologic: negative for hematuria Abdominal: negative for nausea, vomiting, diarrhea, bright red blood per rectum, melena, or hematemesis Neurologic: negative for visual changes, syncope, or dizziness All other systems reviewed and are otherwise negative except as noted above.    Blood pressure (!) 162/82, pulse 79, height 5\' 7"  (1.702 m), weight 162 lb (73.5 kg).  General appearance: alert and no distress Neck: no adenopathy, no carotid bruit, no JVD, supple, symmetrical,  trachea midline, thyroid not enlarged, symmetric, no tenderness/mass/nodules and Right subclavian bruit Lungs: clear to auscultation bilaterally Heart: regular rate and rhythm, S1, S2 normal, no murmur, click, rub or gallop Extremities: extremities normal, atraumatic, no cyanosis or edema Pulses: 2+ and symmetric Skin: Skin color, texture, turgor normal. No rashes or lesions Neurologic: Alert and oriented X 3, normal strength and tone. Normal symmetric reflexes. Normal coordination and gait  EKG sinus rhythm at 79 with right bundle-branch block. I personally reviewed this EKG.  ASSESSMENT AND PLAN:   Hypertension History of essential hypertension blood pressure measured to 170/82 falling to 146/84 by the end of the visit.Marland Kitchen He is on hydrochlorothiazide, losartan and metoprolol. Continue current meds at current dosing.  Internal carotid artery stenosis History of moderately severe right internal carotid artery stenosis by duplex ultrasound 06/04/17. In addition, he has a significant right subclavian stenosis with a 30 mm upper extremity blood pressure gradient. We'll continue to follow this on an annual basis.  Hyperlipidemia History of hyperlipidemia on Pralulent with lipid profile performed 01/01/17 with total cholesterol of 158, LDL 48  and HDL of 81.  S/P angioplasty with stent 06/14/16 PCI & DES of high grade AV groove LCX and 1st OM  History of coronary artery disease status post coronary artery bypass grafting 5 by Dr. Merilynn Finland 06/16/08 with a free LIMA to his LAD, sequential vein to the first diagonal branch, first and second obtuse marginal branches and a vein to the PDA. I catheterized him in September of last year revealing an occluded RCA vein graft, and a patent free LIMA to the LAD. His circumflex vein graft was occluded at its origin however the continuation between the 2 marginal branches was patent and his native circumflex had a long diffuse narrowing. Also performed stenting  using a 2.25 x 30 mm long synergy drug-eluting stent with excellent results. His dyspnea improved after that and he has remained asymptomatic since.      [name] MD FACP,FACC,FAHA, [facility] 07/19/2017 10:38 AM

## 2017-07-19 NOTE — Addendum Note (Signed)
Addended by: Zebedee Iba on: 07/19/2017 11:21 AM   Modules accepted: Orders

## 2017-07-19 NOTE — Assessment & Plan Note (Signed)
History of coronary artery disease status post coronary artery bypass grafting 5 by Dr. Merilynn Finland 06/16/08 with a free LIMA to his LAD, sequential vein to the first diagonal branch, first and second obtuse marginal branches and a vein to the PDA. I catheterized him in September of last year revealing an occluded RCA vein graft, and a patent free LIMA to the LAD. His circumflex vein graft was occluded at its origin however the continuation between the 2 marginal branches was patent and his native circumflex had a long diffuse narrowing. Also performed stenting using a 2.25 x 30 mm long synergy drug-eluting stent with excellent results. His dyspnea improved after that and he has remained asymptomatic since.

## 2017-07-19 NOTE — Patient Instructions (Signed)
Medication Instructions: Your physician recommends that you continue on your current medications as directed. Please refer to the Current Medication list given to you today.   Testing/Procedures: Your physician has requested that you have a carotid duplex in September (2019). This test is an ultrasound of the carotid arteries in your neck. It looks at blood flow through these arteries that supply the brain with blood. Allow one hour for this exam. There are no restrictions or special instructions.  Follow-Up: Your physician wants you to follow-up in: 1 year with Dr. Gwenlyn Found. You will receive a reminder letter in the mail two months in advance. If you don't receive a letter, please call our office to schedule the follow-up appointment.   Any Other Special Instructions are listed below:  You have been cleared for surgery.   If you need a refill on your cardiac medications before your next appointment, please call your pharmacy.

## 2017-07-19 NOTE — Assessment & Plan Note (Signed)
History of moderately severe right internal carotid artery stenosis by duplex ultrasound 06/04/17. In addition, he has a significant right subclavian stenosis with a 30 mm upper extremity blood pressure gradient. We'll continue to follow this on an annual basis.

## 2017-07-23 DIAGNOSIS — G8929 Other chronic pain: Secondary | ICD-10-CM | POA: Diagnosis not present

## 2017-07-23 DIAGNOSIS — M25511 Pain in right shoulder: Secondary | ICD-10-CM | POA: Diagnosis not present

## 2017-07-23 DIAGNOSIS — M19011 Primary osteoarthritis, right shoulder: Secondary | ICD-10-CM | POA: Diagnosis not present

## 2017-07-24 MED FILL — PRALUENT 150 MG/ML PEN: 150 | 28 days supply | Qty: 2 | Fill #2

## 2017-07-25 MED FILL — LORazepam 1 MG TABS: 1 | 90 days supply | Qty: 360 | Fill #0

## 2017-08-01 DIAGNOSIS — M19011 Primary osteoarthritis, right shoulder: Secondary | ICD-10-CM | POA: Diagnosis not present

## 2017-08-01 MED FILL — traMADol HCL 50 MG TABS: 50 | 8 days supply | Qty: 60 | Fill #0

## 2017-08-08 DIAGNOSIS — M75101 Unspecified rotator cuff tear or rupture of right shoulder, not specified as traumatic: Secondary | ICD-10-CM | POA: Diagnosis not present

## 2017-08-08 DIAGNOSIS — M19011 Primary osteoarthritis, right shoulder: Secondary | ICD-10-CM | POA: Diagnosis not present

## 2017-08-13 MED FILL — TRINTELLIX 5 MG TABLET: 5 | 30 days supply | Qty: 90 | Fill #1

## 2017-08-13 MED FILL — LOSARTAN POTASSIUM 50 MG TA: 50 | 90 days supply | Qty: 90 | Fill #2

## 2017-08-20 ENCOUNTER — Encounter: Payer: Self-pay | Admitting: Internal Medicine

## 2017-08-20 MED FILL — AMPHETAMINE SALTS 30 MG TAB: 30 | 90 days supply | Qty: 270 | Fill #0

## 2017-08-20 MED FILL — PRALUENT 150 MG/ML PEN: 150 | 28 days supply | Qty: 2 | Fill #3

## 2017-09-03 ENCOUNTER — Telehealth: Payer: Self-pay | Admitting: Internal Medicine

## 2017-09-03 NOTE — Telephone Encounter (Signed)
Faxed documentation to [facility] @ [phone] r/t Umbilical Hernia.  Requesting Consult for surgical repair.  Requested physicians [place], Ozella Almond or Merriam Woods.  They will contact patient @ [phone].  Patient advised of this information today.  Spoke with [place] today and they advised to fax information over and they will scheduled with first available physician.

## 2017-09-13 ENCOUNTER — Other Ambulatory Visit: Payer: Self-pay | Admitting: Internal Medicine

## 2017-09-13 MED FILL — LEVOTHYROXINE 75 MCG TABLET: 75 | 90 days supply | Qty: 90 | Fill #0

## 2017-09-13 MED FILL — PANTOPRAZOLE SOD DR 40 MG T: 40 | 90 days supply | Qty: 90 | Fill #1

## 2017-09-13 MED FILL — AMLODIPINE BESYLATE 10 MG T: 10 | 90 days supply | Qty: 90 | Fill #2

## 2017-09-13 MED FILL — CLOPIDOGREL 75 MG TABLET: 75 | 90 days supply | Qty: 90 | Fill #1

## 2017-09-13 MED FILL — HYDROCHLOROTHIAZIDE 12.5 MG: 12.5 | 90 days supply | Qty: 90 | Fill #2

## 2017-09-16 ENCOUNTER — Ambulatory Visit: Payer: Self-pay | Admitting: Surgery

## 2017-09-16 DIAGNOSIS — K429 Umbilical hernia without obstruction or gangrene: Secondary | ICD-10-CM | POA: Diagnosis not present

## 2017-09-16 MED FILL — PRALUENT 150 MG/ML PEN: 150 | 28 days supply | Qty: 2 | Fill #4

## 2017-09-16 NOTE — H&P (Signed)
History of Present Illness [name]. Kunta Hilleary MD; 09/16/2017 10:30 AM) The patient is a 66 year old male who presents with an umbilical hernia. Referred by Dr. Renold Genta for umbilical hernia  This is a 66 year old male who has had an umbilical hernia for several years. Over the last several months it is enlarged and become more tender. He denies any obstructive symptoms. Patient has had a lot of shoulder issues. 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 He is not scheduled to have any type shoulder surgery. He would like to have the umbilical hernia repaired because of increasing symptoms. He does have coronary artery disease and is under the care of Dr. Alvester Chou. He is on Plavix and aspirin.   Past Surgical History Mammie Lorenzo, LPN; 93/81/8299 37:16 AM) Coronary Artery Bypass Graft Shoulder Surgery Left. Spinal Surgery - Neck  Diagnostic Studies History Mammie Lorenzo, LPN; 96/78/9381 01:75 AM) Colonoscopy 5-10 years ago  Allergies Alean Rinne, RMA; 09/16/2017 10:05 AM) Alveta Heimlich *ANTIHYPERLIPIDEMICS* Lipitor *ANTIHYPERLIPIDEMICS* Lisinopril *ANTIHYPERTENSIVES* Tricor *ANTIHYPERLIPIDEMICS* Allergies Reconciled  Medication History Alean Rinne, RMA; 09/16/2017 10:05 AM) Amphetamine-Dextroamphetamine (30MG  Tablet, Oral) Active. LORazepam (1MG  Tablet, Oral) Active. TraMADol HCl (50MG  Tablet, Oral) Active. AmLODIPine Besylate (10MG  Tablet, Oral) Active. Clopidogrel Bisulfate (75MG  Tablet, Oral) Active. HydroCHLOROthiazide (12.5MG  Capsule, Oral) Active. Levothyroxine Sodium (75MCG Tablet, Oral) Active. Losartan Potassium (50MG  Tablet, Oral) Active. Metoprolol Succinate ER (50MG  Tablet ER 24HR, Oral) Active. Pantoprazole Sodium (40MG  Tablet DR, Oral) Active. Praluent (150MG /ML Soln Pen-inj, Subcutaneous) Active. Trintellix (5MG  Tablet, Oral) Active. Medications Reconciled  Social History Mammie Lorenzo, LPN; 07/25/8526 78:24  AM) Alcohol use Moderate alcohol use. Caffeine use Carbonated beverages, Coffee, Tea. No drug use Tobacco use Former smoker.  Family History Mammie Lorenzo, LPN; 23/53/6144 31:54 AM) Arthritis Father. Cerebrovascular Accident Father. Heart Disease Father. Heart disease in male family member before age 62 Hypertension Father. Ovarian Cancer Mother. Thyroid problems Father.  Other Problems Mammie Lorenzo, LPN; 00/86/7619 50:93 AM) Anxiety Disorder Arthritis Back Pain Cancer Depression Gastric Ulcer Gastroesophageal Reflux Disease High blood pressure Hypercholesterolemia Migraine Headache Thyroid Disease Umbilical Hernia Repair Vascular Disease     Review of Systems Claiborne Billings Dockery LPN; 26/71/2458 09:98 AM) General Not Present- Appetite Loss, Chills, Fatigue, Fever, Night Sweats, Weight Gain and Weight Loss. Skin Not Present- Change in Wart/Mole, Dryness, Hives, Jaundice, New Lesions, Non-Healing Wounds, Rash and Ulcer. HEENT Present- Ringing in the Ears, Visual Disturbances and Wears glasses/contact lenses. Not Present- Earache, Hearing Loss, Hoarseness, Nose Bleed, Oral Ulcers, Seasonal Allergies, Sinus Pain, Sore Throat and Yellow Eyes. Respiratory Not Present- Bloody sputum, Chronic Cough, Difficulty Breathing, Snoring and Wheezing. Breast Not Present- Breast Mass, Breast Pain, Nipple Discharge and Skin Changes. Cardiovascular Not Present- Chest Pain, Difficulty Breathing Lying Down, Leg Cramps, Palpitations, Rapid Heart Rate, Shortness of Breath and Swelling of Extremities. Gastrointestinal Not Present- Abdominal Pain, Bloating, Bloody Stool, Change in Bowel Habits, Chronic diarrhea, Constipation, Difficulty Swallowing, Excessive gas, Gets full quickly at meals, Hemorrhoids, Indigestion, Nausea, Rectal Pain and Vomiting. Male Genitourinary Not Present- Blood in Urine, Change in Urinary Stream, Frequency, Impotence, Nocturia, Painful Urination, Urgency  and Urine Leakage. Musculoskeletal Present- Joint Pain. Not Present- Back Pain, Joint Stiffness, Muscle Pain, Muscle Weakness and Swelling of Extremities. Neurological Not Present- Decreased Memory, Fainting, Headaches, Numbness, Seizures, Tingling, Tremor, Trouble walking and Weakness. Psychiatric Not Present- Anxiety, Bipolar, Change in Sleep Pattern, Depression, Fearful and Frequent crying. Hematology Present- Blood Thinners and Easy Bruising. Not Present- Excessive bleeding, Gland problems, HIV and Persistent Infections.  Vitals Mardene Celeste King RMA; 09/16/2017 10:03 AM) 09/16/2017 10:03 AM Weight: 160 lb Height: 67in Body Surface Area: 1.84 m Body Mass Index: 25.06 kg/m  Temp.: 97.41F  Pulse: 97 (Regular)  BP: 170/90 (Sitting, Left Arm, Standard)      Physical Exam Rodman Key K. Maddisyn Hegwood MD; 09/16/2017 10:31 AM)  The physical exam findings are as follows: Note:WDWN in NAD Eyes: Pupils equal, round; sclera anicteric HENT: Oral mucosa moist; good dentition Neck: No masses palpated, no thyromegaly Lungs: CTA bilaterally; normal respiratory effort CV: Regular rate and rhythm; no murmurs; extremities well-perfused with no edema Abd: +bowel sounds, soft, non-tender, no palpable organomegaly; small protruding umbilical hernia - easily reducible; mildly tender Skin: Warm, dry; no sign of jaundice Psychiatric - alert and oriented x 4; calm mood and affect    Assessment & Plan Rodman Key K. Bodhi Stenglein MD; 62/56/3893 73:42 AM)  UMBILICAL HERNIA WITHOUT OBSTRUCTION OR GANGRENE (K42.9)  Current Plans Schedule for Surgery - Umbilical hernia repair with mesh. The surgical procedure has been discussed with the patient. Potential risks, benefits, alternative treatments, and expected outcomes have been explained. All of the patient's questions at this time have been answered. The likelihood of reaching the patient's treatment goal is good. The patient understand the proposed surgical  procedure and wishes to proceed.   Cardiac clearance first by Dr. Gwenlyn Found Hold Plavix 5 days prior to surgery  [name]. Georgette Dover, MD, [facility]  General/ Trauma Surgery  09/16/2017 10:31 AM

## 2017-09-17 ENCOUNTER — Telehealth: Payer: Self-pay

## 2017-09-17 NOTE — Telephone Encounter (Signed)
      [facility] Pre-operative Risk Assessment    Request for surgical clearance:  Last OV Dr Gwenlyn Found 07-19-2017  1. What type of surgery is being performed? UMBILICAL HERNIA REPAIR   2. When is this surgery scheduled? NOT SCHEDULED   3. Are there any medications that need to be held prior to surgery and how long?ASA AND PLAVIX   4. Practice name and name of physician performing surgery?  [facility]-Dr Donnie Mesa, MD  5. What is your office phone and fax number? [phone] FX [phone]   6. Anesthesia type (None, local, MAC, general) ? GENERAL ANESTHESIA   [name] 09/17/2017, 4:21 PM  _________________________________________________________________   (provider comments below)

## 2017-09-17 NOTE — Telephone Encounter (Signed)
   Primary Cardiologist: Quay Burow, MD  Chart reviewed as part of pre-operative protocol coverage. Given past medical history and time since last visit, based on ACC/AHA guidelines, [name] would be at acceptable risk for the planned procedure without further cardiovascular testing. He may hold plavix 5 days prior to surgery however, we do recommend that he remain on ASA and  blocker therapy throughout the perioperative period.  Please call with questions.  [name], NP 09/17/2017, 5:21 PM

## 2017-09-20 ENCOUNTER — Telehealth: Payer: Self-pay | Admitting: Cardiovascular Disease

## 2017-09-20 NOTE — Telephone Encounter (Signed)
[name] is calling because to get a clearance and is calling to check on it .  It was sent on 09/16/17  . Please call

## 2017-09-20 NOTE — Telephone Encounter (Signed)
SPOKE TO PATIENT - INFORMED PATIENT HAS CLEARANCE HERNIA REPAIR. INFORMED PATIENT ROUTED  CLEARANCE  NOTE TO DR TSUEI'S OFFICE PATIENT AWARE TO CONTACT SURGEON'S OFFICE. VERBALIZED UNDERSTANDING

## 2017-09-26 MED FILL — TRINTELLIX 5 MG TABLET: 5 | 30 days supply | Qty: 90 | Fill #2

## 2017-10-14 MED FILL — METOPROLOL SUCC ER 50 MG TA: 50 | 90 days supply | Qty: 90 | Fill #3

## 2017-10-14 MED FILL — PRALUENT 150 MG/ML PEN: 150 | 28 days supply | Qty: 2 | Fill #5

## 2017-10-15 ENCOUNTER — Encounter: Payer: Self-pay | Admitting: Internal Medicine

## 2017-10-15 ENCOUNTER — Ambulatory Visit: Payer: 59 | Admitting: Internal Medicine

## 2017-10-15 VITALS — BP 142/78 | HR 98 | Temp 98.1°F | Wt 161.0 lb

## 2017-10-15 DIAGNOSIS — J40 Bronchitis, not specified as acute or chronic: Secondary | ICD-10-CM

## 2017-10-15 DIAGNOSIS — J0100 Acute maxillary sinusitis, unspecified: Secondary | ICD-10-CM

## 2017-10-15 MED ORDER — DOXYCYCLINE HYCLATE 100 MG PO TABS: 100.0000 mg | ORAL_TABLET | Freq: Two times a day (BID) | ORAL | 0 refills | Status: DC

## 2017-10-15 MED ORDER — CEFTRIAXONE SODIUM 1 G IJ SOLR
1.0000 g | Freq: Once | INTRAMUSCULAR | Status: AC
  Administered 2017-10-15: 1 g via INTRAMUSCULAR

## 2017-10-15 MED ORDER — LEVOFLOXACIN 500 MG PO TABS: 500.0000 mg | ORAL_TABLET | Freq: Every day | ORAL | 0 refills | Status: DC

## 2017-10-15 MED ORDER — HYDROCOD POLST-CPM POLST ER 10-8 MG/5ML PO SUER: 5.0000 mL | Freq: Two times a day (BID) | ORAL | 0 refills | Status: DC | PRN

## 2017-10-15 MED FILL — HYDROCODONE-CHLORPHENIRAM S: 10-8 | 14 days supply | Qty: 140 | Fill #0

## 2017-10-15 MED FILL — levoFLOXacin 500 MG TABS: 500 | 10 days supply | Qty: 10 | Fill #0

## 2017-10-15 NOTE — Patient Instructions (Signed)
Doxycycline 100 mg twice a day for 10 days.  Rocephin 1 g IM given in office.  Tussionex 1 teaspoon p.o. every 12 hours as needed cough.  Rest and drink plenty of fluids.

## 2017-10-15 NOTE — Progress Notes (Signed)
   Subjective:    Patient ID: [name], male    DOB: 16-Dec-1950, 67 y.o.   MRN: [id number]  HPI   3 weeks of URI symptoms. Cough congestion with discolored nasal drainage and sputum production.  No flu like symptoms in terms of myalgias, fever or chills.  Has been hoarse.  Scheduled to have a hernia repair in the near future.  Says orthopedist did not want to shoulder arthroplasty.  Says there is little to work with.    Review of Systems see above     Objective:   Physical Exam Skin warm and dry.  Nodes none.  TMs are clear.  Pharynx slightly injected without any today.  Neck is supple.  Chest is clear to auscultation without rales or wheezing or rhonchi.       Assessment & Plan:  Acute sinusitis  Acute bronchitis  Plan: Rocephin 1 g IM.  Initially was prescribed Levaquin but he had some concerns about tendinopathy so changed to doxycycline 100 mg twice.  Tussionex 1 teaspoon p.o. every 12 hours as needed cough.  Rest and drink plenty of fluids.

## 2017-10-16 MED FILL — DOXYCYCLINE HYCLATE 100 MG: 100 | 10 days supply | Qty: 20 | Fill #0

## 2017-10-21 MED FILL — LORazepam 1 MG TABS: 1 | 90 days supply | Qty: 360 | Fill #0

## 2017-10-23 NOTE — Pre-Procedure Instructions (Signed)
[name]  10/23/2017      [address] [place] [address] Phone: [phone] Fax: [phone]    Your procedure is scheduled on January 29  Report to [place] at [place].M.  Call this number if you have problems the morning of surgery:  [phone]   Remember:  Do not eat food or drink liquids after midnight.  Continue all medications as directed by your physician except follow these medication instructions before surgery below  Please complete your PRE-SURGERY ENSURE that was given to before you leave your house the morning of surgery.  Please, if able, drink it in one setting. DO NOT SIP.    Take these medicines the morning of surgery with A SIP OF WATER  amLODipine (NORVASC) doxycycline (VIBRA-TABS)  levothyroxine (SYNTHROID, LEVOTHROID) loratadine (CLARITIN)  LORazepam (ATIVAN)  metoprolol succinate (TOPROL-XL) pantoprazole (PROTONIX) traMADol (ULTRAM)  FOLLOW PHYSICIANS INSTRUCTIONS ABOUT clopidogrel (PLAVIX)   7 days prior to surgery STOP taking any Aspirin(unless otherwise instructed by your surgeon), Aleve, Naproxen, Ibuprofen, Motrin, Advil, Goody's, BC's, all herbal medications, fish oil, and all vitamins  Follow your doctors instructions regarding your Aspirin.  If no instructions were given by your doctor, then you will need to call the prescribing office office to get instructions.      Do not wear jewelry  Do not wear lotions, powders, or cologne, or deodorant.  Men may shave face and neck.  Do not bring valuables to the hospital.  [facility] is not responsible for any belongings or valuables.  Contacts, dentures or bridgework may not be worn into surgery.  Leave your suitcase in the car.  After surgery it may be brought to your room.  For patients admitted to the hospital, discharge time will be determined by your  treatment team.  Patients discharged the day of surgery will not be allowed to drive home.    Special instructions:   [facility]- Preparing For Surgery  Before surgery, you can play an important role. Because skin is not sterile, your skin needs to be as free of germs as possible. You can reduce the number of germs on your skin by washing with CHG (chlorahexidine gluconate) Soap before surgery.  CHG is an antiseptic cleaner which kills germs and bonds with the skin to continue killing germs even after washing.  Please do not use if you have an allergy to CHG or antibacterial soaps. If your skin becomes reddened/irritated stop using the CHG.  Do not shave (including legs and underarms) for at least 48 hours prior to first CHG shower. It is OK to shave your face.  Please follow these instructions carefully.   1. Shower the NIGHT BEFORE SURGERY and the MORNING OF SURGERY with CHG.   2. If you chose to wash your hair, wash your hair first as usual with your normal shampoo.  3. After you shampoo, rinse your hair and body thoroughly to remove the shampoo.  4. Use CHG as you would any other liquid soap. You can apply CHG directly to the skin and wash gently with a scrungie or a clean washcloth.   5. Apply the CHG Soap to your body ONLY FROM THE NECK DOWN.  Do not use on open wounds or open sores. Avoid contact with your eyes, ears, mouth and genitals (private parts). Wash Face and genitals (private parts)  with  your normal soap.  6. Wash thoroughly, paying special attention to the area where your surgery will be performed.  7. Thoroughly rinse your body with warm water from the neck down.  8. DO NOT shower/wash with your normal soap after using and rinsing off the CHG Soap.  9. Pat yourself dry with a CLEAN TOWEL.  10. Wear CLEAN PAJAMAS to bed the night before surgery, wear comfortable clothes the morning of surgery  11. Place CLEAN SHEETS on your bed the night of your first shower and  DO NOT SLEEP WITH PETS.    Day of Surgery: Do not apply any deodorants/lotions. Please wear clean clothes to the hospital/surgery center.      Please read over the following fact sheets that you were given.

## 2017-10-24 ENCOUNTER — Telehealth: Payer: Self-pay

## 2017-10-24 ENCOUNTER — Inpatient Hospital Stay (HOSPITAL_COMMUNITY)
Admission: RE | Admit: 2017-10-24 | Discharge: 2017-10-24 | Disposition: A | Discharge status: Home / Self Care | Payer: 59 | Source: Ambulatory Visit

## 2017-10-24 MED ORDER — FLUCONAZOLE 150 MG PO TABS: 150.0000 mg | ORAL_TABLET | Freq: Once | ORAL | 0 refills | Status: AC

## 2017-10-24 MED FILL — FLUCONAZOLE 150 MG TABLET: 150 | 1 days supply | Qty: 1 | Fill #0

## 2017-10-24 NOTE — Telephone Encounter (Signed)
Sen to patient's pharmacy, patient was notified.

## 2017-10-24 NOTE — Telephone Encounter (Signed)
Patient called states he has developed "oral thrush" while being on the doxycycline and would like to know if that is going "to make an impact on his surgery" next week? He would like what to do? He said he has pre-op this afternoon at [facility].

## 2017-10-24 NOTE — Progress Notes (Signed)
PCP: Tedra Senegal, MD  Cardiologist: Ancil Linsey, MD  EKG: 07/19/17 in Epic right BBB  Stress test: 03/29/16 in EPIC  ECHO: 03/19/16 in EPIC  Cardiac Cath: 05/14/2016 in EPIC  Chest x-ray: 10/24/16 in Epic-no recent respiratory complications/infections

## 2017-10-24 NOTE — Telephone Encounter (Signed)
Call in Diflucan 150 mg tablet- should be OK

## 2017-10-28 ENCOUNTER — Encounter (HOSPITAL_COMMUNITY): Payer: Self-pay | Admitting: *Deleted

## 2017-10-28 NOTE — Anesthesia Preprocedure Evaluation (Addendum)
Anesthesia Evaluation  Patient identified by MRN, date of birth, ID band Patient awake    Reviewed: Allergy & Precautions, H&P , NPO status , Patient's Chart, lab work & pertinent test results, reviewed documented beta blocker date and time   Airway Mallampati: II  TM Distance: >3 FB Neck ROM: Limited    Dental no notable dental hx. (+) Teeth Intact, Dental Advisory Given   Pulmonary COPD, former smoker,    Pulmonary exam normal breath sounds clear to auscultation       Cardiovascular Exercise Tolerance: Good hypertension, Pt. on medications and Pt. on home beta blockers + CAD, + CABG and + Peripheral Vascular Disease   Rhythm:Regular Rate:Normal     Neuro/Psych  Headaches, Anxiety Depression TIAnegative psych ROS   GI/Hepatic Neg liver ROS, GERD  Medicated and Controlled,  Endo/Other  Hypothyroidism   Renal/GU negative Renal ROS  negative genitourinary   Musculoskeletal  (+) Arthritis , Osteoarthritis,    Abdominal   Peds  Hematology negative hematology ROS (+)   Anesthesia Other Findings   Reproductive/Obstetrics negative OB ROS                            Anesthesia Physical Anesthesia Plan  ASA: III  Anesthesia Plan: General   Post-op Pain Management:    Induction: Intravenous  PONV Risk Score and Plan: 3 and Ondansetron, Dexamethasone and Midazolam  Airway Management Planned: LMA  Additional Equipment:   Intra-op Plan:   Post-operative Plan: Extubation in OR  Informed Consent: I have reviewed the patients History and Physical, chart, labs and discussed the procedure including the risks, benefits and alternatives for the proposed anesthesia with the patient or authorized representative who has indicated his/her understanding and acceptance.   Dental advisory given  Plan Discussed with: CRNA  Anesthesia Plan Comments:        Anesthesia Quick Evaluation

## 2017-10-29 ENCOUNTER — Ambulatory Visit (HOSPITAL_COMMUNITY)
Admission: RE | Admit: 2017-10-29 | Discharge: 2017-10-29 | Disposition: A | Discharge status: Home / Self Care | Payer: 59 | Source: Ambulatory Visit | Attending: Surgery | Admitting: Surgery

## 2017-10-29 ENCOUNTER — Ambulatory Visit (HOSPITAL_COMMUNITY): Payer: 59 | Admitting: Anesthesiology

## 2017-10-29 ENCOUNTER — Encounter (HOSPITAL_COMMUNITY)
Admission: RE | Disposition: A | Discharge status: Home / Self Care | Payer: Self-pay | Source: Ambulatory Visit | Attending: Surgery

## 2017-10-29 ENCOUNTER — Encounter (HOSPITAL_COMMUNITY): Payer: Self-pay | Admitting: Critical Care Medicine

## 2017-10-29 DIAGNOSIS — Z888 Allergy status to other drugs, medicaments and biological substances status: Secondary | ICD-10-CM | POA: Insufficient documentation

## 2017-10-29 DIAGNOSIS — E039 Hypothyroidism, unspecified: Secondary | ICD-10-CM | POA: Diagnosis not present

## 2017-10-29 DIAGNOSIS — Z8719 Personal history of other diseases of the digestive system: Secondary | ICD-10-CM | POA: Diagnosis not present

## 2017-10-29 DIAGNOSIS — I10 Essential (primary) hypertension: Secondary | ICD-10-CM | POA: Diagnosis not present

## 2017-10-29 DIAGNOSIS — F419 Anxiety disorder, unspecified: Secondary | ICD-10-CM | POA: Insufficient documentation

## 2017-10-29 DIAGNOSIS — K219 Gastro-esophageal reflux disease without esophagitis: Secondary | ICD-10-CM | POA: Insufficient documentation

## 2017-10-29 DIAGNOSIS — Z79899 Other long term (current) drug therapy: Secondary | ICD-10-CM | POA: Diagnosis not present

## 2017-10-29 DIAGNOSIS — Z87891 Personal history of nicotine dependence: Secondary | ICD-10-CM | POA: Insufficient documentation

## 2017-10-29 DIAGNOSIS — F329 Major depressive disorder, single episode, unspecified: Secondary | ICD-10-CM | POA: Insufficient documentation

## 2017-10-29 DIAGNOSIS — I2510 Atherosclerotic heart disease of native coronary artery without angina pectoris: Secondary | ICD-10-CM | POA: Diagnosis not present

## 2017-10-29 DIAGNOSIS — Z8249 Family history of ischemic heart disease and other diseases of the circulatory system: Secondary | ICD-10-CM | POA: Insufficient documentation

## 2017-10-29 DIAGNOSIS — F418 Other specified anxiety disorders: Secondary | ICD-10-CM | POA: Diagnosis not present

## 2017-10-29 DIAGNOSIS — M1990 Unspecified osteoarthritis, unspecified site: Secondary | ICD-10-CM | POA: Diagnosis not present

## 2017-10-29 DIAGNOSIS — K429 Umbilical hernia without obstruction or gangrene: Secondary | ICD-10-CM | POA: Insufficient documentation

## 2017-10-29 DIAGNOSIS — E7800 Pure hypercholesterolemia, unspecified: Secondary | ICD-10-CM | POA: Diagnosis not present

## 2017-10-29 DIAGNOSIS — Z951 Presence of aortocoronary bypass graft: Secondary | ICD-10-CM | POA: Diagnosis not present

## 2017-10-29 DIAGNOSIS — Z7982 Long term (current) use of aspirin: Secondary | ICD-10-CM | POA: Insufficient documentation

## 2017-10-29 DIAGNOSIS — Z7902 Long term (current) use of antithrombotics/antiplatelets: Secondary | ICD-10-CM | POA: Diagnosis not present

## 2017-10-29 DIAGNOSIS — K229 Disease of esophagus, unspecified: Secondary | ICD-10-CM | POA: Diagnosis not present

## 2017-10-29 HISTORY — PX: UMBILICAL HERNIA REPAIR: SHX196

## 2017-10-29 HISTORY — DX: Adverse effect of unspecified anesthetic, initial encounter: T41.45XA

## 2017-10-29 HISTORY — DX: Other complications of anesthesia, initial encounter: T88.59XA

## 2017-10-29 HISTORY — PX: INSERTION OF MESH: SHX5868

## 2017-10-29 LAB — CBC
HEMATOCRIT: 40.6 % (ref 39.0–52.0)
Hemoglobin: 13.3 g/dL (ref 13.0–17.0)
MCH: 27.4 pg (ref 26.0–34.0)
MCHC: 32.8 g/dL (ref 30.0–36.0)
MCV: 83.5 fL (ref 78.0–100.0)
PLATELETS: 170 10*3/uL (ref 150–400)
RBC: 4.86 MIL/uL (ref 4.22–5.81)
RDW: 15.2 % (ref 11.5–15.5)
WBC: 3.6 10*3/uL — ABNORMAL LOW (ref 4.0–10.5)

## 2017-10-29 LAB — BASIC METABOLIC PANEL
Anion gap: 11 (ref 5–15)
BUN: 12 mg/dL (ref 6–20)
CHLORIDE: 105 mmol/L (ref 101–111)
CO2: 24 mmol/L (ref 22–32)
CREATININE: 1.22 mg/dL (ref 0.61–1.24)
Calcium: 9 mg/dL (ref 8.9–10.3)
GFR calc Af Amer: >60 mL/min (ref >60)
GFR calc non Af Amer: >60 mL/min (ref >60)
GLUCOSE: 97 mg/dL (ref 65–99)
POTASSIUM: 3.8 mmol/L (ref 3.5–5.1)
Sodium: 140 mmol/L (ref 135–145)

## 2017-10-29 LAB — PROTIME-INR
INR: 0.97
Prothrombin Time: 12.8 seconds (ref 11.4–15.2)

## 2017-10-29 SURGERY — REPAIR, HERNIA, UMBILICAL, ADULT
Anesthesia: General | Site: Abdomen

## 2017-10-29 MED ORDER — BUPIVACAINE-EPINEPHRINE (PF) 0.5% -1:200000 IJ SOLN
INTRAMUSCULAR | Status: AC
  Filled 2017-10-29: qty 30

## 2017-10-29 MED ORDER — OXYCODONE HCL 5 MG PO TABS: 5.0000 mg | ORAL_TABLET | Freq: Four times a day (QID) | ORAL | 0 refills | Status: DC | PRN

## 2017-10-29 MED ORDER — PROPOFOL 10 MG/ML IV BOLUS
INTRAVENOUS | Status: AC
  Filled 2017-10-29: qty 40

## 2017-10-29 MED ORDER — GLYCOPYRROLATE 0.2 MG/ML IV SOSY
PREFILLED_SYRINGE | INTRAVENOUS | Status: DC | PRN
  Administered 2017-10-29: .2 mg via INTRAVENOUS

## 2017-10-29 MED ORDER — IOPAMIDOL (ISOVUE-300) INJECTION 61%
INTRAVENOUS | Status: AC
  Filled 2017-10-29: qty 50

## 2017-10-29 MED ORDER — PHENYLEPHRINE HCL 10 MG/ML IJ SOLN
INTRAVENOUS | Status: DC | PRN
  Administered 2017-10-29: 25 ug/min via INTRAVENOUS

## 2017-10-29 MED ORDER — FENTANYL CITRATE (PF) 250 MCG/5ML IJ SOLN
INTRAMUSCULAR | Status: AC
Start: 2017-10-29 — End: 2017-10-29
  Filled 2017-10-29: qty 5

## 2017-10-29 MED ORDER — OXYCODONE HCL 5 MG PO TABS
ORAL_TABLET | ORAL | Status: AC
  Filled 2017-10-29: qty 1

## 2017-10-29 MED ORDER — 0.9 % SODIUM CHLORIDE (POUR BTL) OPTIME
TOPICAL | Status: DC | PRN
  Administered 2017-10-29: 1000 mL

## 2017-10-29 MED ORDER — CHLORHEXIDINE GLUCONATE CLOTH 2 % EX PADS: 6.0000 | MEDICATED_PAD | Freq: Once | CUTANEOUS | Status: DC

## 2017-10-29 MED ORDER — GABAPENTIN 300 MG PO CAPS
300.0000 mg | ORAL_CAPSULE | ORAL | Status: AC
  Administered 2017-10-29: 300 mg via ORAL
  Filled 2017-10-29: qty 1

## 2017-10-29 MED ORDER — DEXAMETHASONE SODIUM PHOSPHATE 10 MG/ML IJ SOLN
INTRAMUSCULAR | Status: DC | PRN
  Administered 2017-10-29: 10 mg via INTRAVENOUS

## 2017-10-29 MED ORDER — LACTATED RINGERS IV SOLN
INTRAVENOUS | Status: DC | PRN
  Administered 2017-10-29: 07:00:00 via INTRAVENOUS

## 2017-10-29 MED ORDER — MIDAZOLAM HCL 2 MG/2ML IJ SOLN
INTRAMUSCULAR | Status: AC
  Filled 2017-10-29: qty 2

## 2017-10-29 MED ORDER — BUPIVACAINE-EPINEPHRINE (PF) 0.5% -1:200000 IJ SOLN
INTRAMUSCULAR | Status: DC | PRN
Start: 2017-10-29 — End: 2017-10-29
  Administered 2017-10-29: 10 mL via PERINEURAL

## 2017-10-29 MED ORDER — PROPOFOL 10 MG/ML IV BOLUS
INTRAVENOUS | Status: DC | PRN
  Administered 2017-10-29: 100 mg via INTRAVENOUS
  Administered 2017-10-29: 40 mg via INTRAVENOUS

## 2017-10-29 MED ORDER — MIDAZOLAM HCL 5 MG/5ML IJ SOLN
INTRAMUSCULAR | Status: DC | PRN
  Administered 2017-10-29: 2 mg via INTRAVENOUS

## 2017-10-29 MED ORDER — OXYCODONE HCL 5 MG PO TABS
5.0000 mg | ORAL_TABLET | Freq: Once | ORAL | Status: AC
  Administered 2017-10-29: 5 mg via ORAL

## 2017-10-29 MED ORDER — EPHEDRINE SULFATE-NACL 50-0.9 MG/10ML-% IV SOSY
PREFILLED_SYRINGE | INTRAVENOUS | Status: DC | PRN
  Administered 2017-10-29: 10 mg via INTRAVENOUS
  Administered 2017-10-29: 15 mg via INTRAVENOUS
  Administered 2017-10-29: 5 mg via INTRAVENOUS
  Administered 2017-10-29: 15 mg via INTRAVENOUS

## 2017-10-29 MED ORDER — CEFAZOLIN SODIUM-DEXTROSE 2-4 GM/100ML-% IV SOLN
2.0000 g | INTRAVENOUS | Status: AC
  Administered 2017-10-29: 2 g via INTRAVENOUS
  Filled 2017-10-29: qty 100

## 2017-10-29 MED ORDER — OXYCODONE HCL 5 MG PO TABS
ORAL_TABLET | ORAL | Status: AC
  Administered 2017-10-29: 5 mg via ORAL
  Filled 2017-10-29: qty 1

## 2017-10-29 MED ORDER — PHENYLEPHRINE 40 MCG/ML (10ML) SYRINGE FOR IV PUSH (FOR BLOOD PRESSURE SUPPORT)
PREFILLED_SYRINGE | INTRAVENOUS | Status: DC | PRN
  Administered 2017-10-29: 80 ug via INTRAVENOUS
  Administered 2017-10-29: 40 ug via INTRAVENOUS
  Administered 2017-10-29: 80 ug via INTRAVENOUS
  Administered 2017-10-29: 120 ug via INTRAVENOUS

## 2017-10-29 MED ORDER — LIDOCAINE 2% (20 MG/ML) 5 ML SYRINGE
INTRAMUSCULAR | Status: DC | PRN
  Administered 2017-10-29: 60 mg via INTRAVENOUS

## 2017-10-29 MED ORDER — ACETAMINOPHEN 500 MG PO TABS
1000.0000 mg | ORAL_TABLET | ORAL | Status: AC
  Administered 2017-10-29: 1000 mg via ORAL
  Filled 2017-10-29: qty 2

## 2017-10-29 MED ORDER — HYDROMORPHONE HCL 1 MG/ML IJ SOLN
0.2500 mg | INTRAMUSCULAR | Status: DC | PRN
  Administered 2017-10-29 (×4): 0.5 mg via INTRAVENOUS

## 2017-10-29 MED ORDER — HYDROMORPHONE HCL 1 MG/ML IJ SOLN
INTRAMUSCULAR | Status: AC
  Administered 2017-10-29: 0.5 mg via INTRAVENOUS
  Filled 2017-10-29: qty 1

## 2017-10-29 MED ORDER — FENTANYL CITRATE (PF) 250 MCG/5ML IJ SOLN
INTRAMUSCULAR | Status: DC | PRN
  Administered 2017-10-29: 25 ug via INTRAVENOUS
  Administered 2017-10-29: 50 ug via INTRAVENOUS
  Administered 2017-10-29 (×2): 25 ug via INTRAVENOUS

## 2017-10-29 MED ORDER — ONDANSETRON HCL 4 MG/2ML IJ SOLN
INTRAMUSCULAR | Status: DC | PRN
  Administered 2017-10-29: 4 mg via INTRAVENOUS

## 2017-10-29 MED FILL — oxyCODONE HCL 5 MG TABS: 5 | 5 days supply | Qty: 20 | Fill #0

## 2017-10-29 SURGICAL SUPPLY — 48 items
APL SKNCLS STERI-STRIP NONHPOA (GAUZE/BANDAGES/DRESSINGS) ×1
BENZOIN TINCTURE PRP APPL 2/3 (GAUZE/BANDAGES/DRESSINGS) ×3 IMPLANT
BLADE CLIPPER SURG (BLADE) ×2 IMPLANT
BLADE SURG 15 STRL LF DISP TIS (BLADE) ×1 IMPLANT
BLADE SURG 15 STRL SS (BLADE) ×3
CANISTER SUCT 3000ML PPV (MISCELLANEOUS) ×2 IMPLANT
CHLORAPREP W/TINT 26ML (MISCELLANEOUS) ×3 IMPLANT
CLOSURE WOUND 1/2 X4 (GAUZE/BANDAGES/DRESSINGS) ×1
COVER SURGICAL LIGHT HANDLE (MISCELLANEOUS) ×3 IMPLANT
DRAPE LAPAROTOMY 100X72 PEDS (DRAPES) ×3 IMPLANT
DRAPE UTILITY XL STRL (DRAPES) ×3 IMPLANT
DRSG TEGADERM 4X4.75 (GAUZE/BANDAGES/DRESSINGS) ×3 IMPLANT
ELECT CAUTERY BLADE 6.4 (BLADE) ×3 IMPLANT
ELECT REM PT RETURN 9FT ADLT (ELECTROSURGICAL) ×3
ELECTRODE REM PT RTRN 9FT ADLT (ELECTROSURGICAL) ×1 IMPLANT
GAUZE SPONGE 2X2 8PLY STRL LF (GAUZE/BANDAGES/DRESSINGS) ×1 IMPLANT
GAUZE SPONGE 4X4 16PLY XRAY LF (GAUZE/BANDAGES/DRESSINGS) ×3 IMPLANT
GLOVE BIO SURGEON STRL SZ7 (GLOVE) ×3 IMPLANT
GLOVE BIOGEL PI IND STRL 6.5 (GLOVE) IMPLANT
GLOVE BIOGEL PI IND STRL 7.5 (GLOVE) ×1 IMPLANT
GLOVE BIOGEL PI INDICATOR 6.5 (GLOVE) ×2
GLOVE BIOGEL PI INDICATOR 7.5 (GLOVE) ×2
GLOVE SURG SS PI 6.0 STRL IVOR (GLOVE) ×2 IMPLANT
GOWN STRL REUS W/ TWL LRG LVL3 (GOWN DISPOSABLE) ×2 IMPLANT
GOWN STRL REUS W/TWL LRG LVL3 (GOWN DISPOSABLE) ×6
KIT BASIN OR (CUSTOM PROCEDURE TRAY) ×3 IMPLANT
KIT ROOM TURNOVER OR (KITS) ×3 IMPLANT
MESH VENTRALEX ST 1-7/10 CRC S (Mesh General) ×2 IMPLANT
NDL HYPO 25GX1X1/2 BEV (NEEDLE) ×1 IMPLANT
NEEDLE HYPO 25GX1X1/2 BEV (NEEDLE) ×3 IMPLANT
NS IRRIG 1000ML POUR BTL (IV SOLUTION) ×3 IMPLANT
PACK SURGICAL SETUP 50X90 (CUSTOM PROCEDURE TRAY) ×3 IMPLANT
PAD ARMBOARD 7.5X6 YLW CONV (MISCELLANEOUS) ×3 IMPLANT
PENCIL BUTTON HOLSTER BLD 10FT (ELECTRODE) ×3 IMPLANT
SPONGE GAUZE 2X2 STER 10/PKG (GAUZE/BANDAGES/DRESSINGS) ×2
STRIP CLOSURE SKIN 1/2X4 (GAUZE/BANDAGES/DRESSINGS) ×2 IMPLANT
SUT MNCRL AB 4-0 PS2 18 (SUTURE) ×3 IMPLANT
SUT NOVA NAB DX-16 0-1 5-0 T12 (SUTURE) ×3 IMPLANT
SUT NOVA NAB GS-21 0 18 T12 DT (SUTURE) ×3 IMPLANT
SUT VIC AB 3-0 SH 27 (SUTURE) ×3
SUT VIC AB 3-0 SH 27X BRD (SUTURE) ×1 IMPLANT
SYR BULB 3OZ (MISCELLANEOUS) ×3 IMPLANT
SYR CONTROL 10ML LL (SYRINGE) ×3 IMPLANT
TOWEL OR 17X24 6PK STRL BLUE (TOWEL DISPOSABLE) ×3 IMPLANT
TOWEL OR 17X26 10 PK STRL BLUE (TOWEL DISPOSABLE) ×3 IMPLANT
TUBE CONNECTING 12'X1/4 (SUCTIONS) ×1
TUBE CONNECTING 12X1/4 (SUCTIONS) ×1 IMPLANT
YANKAUER SUCT BULB TIP NO VENT (SUCTIONS) ×2 IMPLANT

## 2017-10-29 NOTE — Anesthesia Procedure Notes (Signed)
Procedure Name: LMA Insertion Date/Time: 10/29/2017 7:25 AM Performed by: Wilburn Cornelia, CRNA Pre-anesthesia Checklist: Patient identified, Emergency Drugs available, Suction available, Patient being monitored and Timeout performed Patient Re-evaluated:Patient Re-evaluated prior to induction Oxygen Delivery Method: Circle system utilized Preoxygenation: Pre-oxygenation with 100% oxygen Induction Type: IV induction Ventilation: Mask ventilation without difficulty LMA: LMA inserted LMA Size: 4.0 Placement Confirmation: positive ETCO2,  breath sounds checked- equal and bilateral and CO2 detector Tube secured with: Tape Dental Injury: Teeth and Oropharynx as per pre-operative assessment

## 2017-10-29 NOTE — Anesthesia Postprocedure Evaluation (Signed)
Anesthesia Post Note  Patient: [name]  Procedure(s) Performed: UMBILICAL HERNIA REPAIR (N/A Abdomen) INSERTION OF MESH (N/A Abdomen)     Patient location during evaluation: PACU Anesthesia Type: General Level of consciousness: awake and alert Pain management: pain level controlled Vital Signs Assessment: post-procedure vital signs reviewed and stable Respiratory status: spontaneous breathing, nonlabored ventilation and respiratory function stable Cardiovascular status: blood pressure returned to baseline and stable Postop Assessment: no apparent nausea or vomiting Anesthetic complications: no    Last Vitals:  Vitals:   10/29/17 0930 10/29/17 0955  BP:  (!) 154/87  Pulse: 78 72  Resp: 20 15  Temp:    SpO2: 90% 94%    Last Pain:  Vitals:   10/29/17 0930  PainSc: 2                  Menno Vanbergen,W. EDMOND

## 2017-10-29 NOTE — Progress Notes (Signed)
Patient unable to void prior to discharge.  Has received 3L IV fluid and 430mL PO.  He is ambulating appropriately with minimal pain.  Multiple bladder scans done and showed no more than 210mL.  Dr. Georgette Dover was notified and gave permission for patient to discharge home.  Instructed to continue fluid intake and return to hospital if unable to void in the next 6 hours.

## 2017-10-29 NOTE — Op Note (Signed)
Indications:  The patient presented with a history of a small enlarging umbilical hernia.  The patient was examined and we recommended umbilical hernia repair with mesh.  Pre-operative diagnosis:  Umbilical hernia  Post-operative diagnosis:  Same  Procedure:  Umbilical hernia repair with mesh  Procedure Details  The patient was seen again in the Holding Room. The risks, benefits, complications, treatment options, and expected outcomes were discussed with the patient. The possibilities of reaction to medication, pulmonary aspiration, perforation of viscus, bleeding, recurrent infection, the need for additional procedures, and development of a complication requiring transfusion or further operation were discussed with the patient and/or family. There was concurrence with the proposed plan, and informed consent was obtained. The site of surgery was properly noted/marked. The patient was taken to the Operating Room, identified as [name], and the procedure verified as umbilical hernia repair. A Time Out was held and the above information confirmed.  After an adequate level of general anesthesia was obtained, the patient's abdomen was prepped with Chloraprep and draped in sterile fashion.  We made a transverse incision above the umbilicus.  Dissection was carried down to the hernia sac with cautery.  We dissected bluntly around the hernia sac down to the edge of the fascial defect.  We reduced the hernia sac back into the pre-peritoneal space.  The fascial defect measured 1 cm.  We cleared the fascia in all directions.  A small Ventralex mesh was inserted into the pre-peritoneal space and was deployed.  The mesh was secured with four trans-fascial sutures of 0 Novofil.  The fascial defect was closed with multiple interrupted figure-of-eight 1 Novofil sutures.  The base of the umbilicus was tacked down with 3-0 Vicryl.  3-0 Vicryl was used to close the subcutaneous tissues and 4-0 Monocryl was used to  close the skin.  Steri-strips and clean dressing were applied.  The patient was extubated and brought to the recovery room in stable condition.  All sponge, instrument, and needle counts were correct prior to closure and at the conclusion of the case.   Estimated Blood Loss: Minimal          Complications: None; patient tolerated the procedure well.         Disposition: PACU - hemodynamically stable.         Condition: stable  [name]. Gregory Dover, MD, [facility]  General/ Trauma Surgery  10/29/2017 8:13 AM

## 2017-10-29 NOTE — H&P (Signed)
History of Present Illness The patient is a 67 year old male who presents with an umbilical hernia. Referred by Dr. Renold Genta for umbilical hernia  This is a 67 year old male who has had an umbilical hernia for several years. Over the last several months it is enlarged and become more tender. He denies any obstructive symptoms. Patient has had a lot of shoulder issues. 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 He is not scheduled to have any type shoulder surgery. He would like to have the umbilical hernia repaired because of increasing symptoms. He does have coronary artery disease and is under the care of Dr. Alvester Chou. He is on Plavix and aspirin.   Past Surgical History  Coronary Artery Bypass Graft Shoulder Surgery Left. Spinal Surgery - Neck  Diagnostic Studies History  Colonoscopy 5-10 years ago  Allergies  Crestor *ANTIHYPERLIPIDEMICS* Lipitor *ANTIHYPERLIPIDEMICS* Lisinopril *ANTIHYPERTENSIVES* Tricor *ANTIHYPERLIPIDEMICS* Allergies Reconciled  Medication History  Amphetamine-Dextroamphetamine (30MG  Tablet, Oral) Active. LORazepam (1MG  Tablet, Oral) Active. TraMADol HCl (50MG  Tablet, Oral) Active. AmLODIPine Besylate (10MG  Tablet, Oral) Active. Clopidogrel Bisulfate (75MG  Tablet, Oral) Active. HydroCHLOROthiazide (12.5MG  Capsule, Oral) Active. Levothyroxine Sodium (75MCG Tablet, Oral) Active. Losartan Potassium (50MG  Tablet, Oral) Active. Metoprolol Succinate ER (50MG  Tablet ER 24HR, Oral) Active. Pantoprazole Sodium (40MG  Tablet DR, Oral) Active. Praluent (150MG /ML Soln Pen-inj, Subcutaneous) Active. Trintellix (5MG  Tablet, Oral) Active. Medications Reconciled  Social History  Alcohol use Moderate alcohol use. Caffeine use Carbonated beverages, Coffee, Tea. No drug use Tobacco use Former smoker.  Family History  Arthritis Father. Cerebrovascular Accident Father. Heart Disease Father. Heart disease  in male family member before age 20 Hypertension Father. Ovarian Cancer Mother. Thyroid problems Father.  Other Problems  Anxiety Disorder Arthritis Back Pain Cancer Depression Gastric Ulcer Gastroesophageal Reflux Disease High blood pressure Hypercholesterolemia Migraine Headache Thyroid Disease Umbilical Hernia Repair Vascular Disease     Review of Systems  General Not Present- Appetite Loss, Chills, Fatigue, Fever, Night Sweats, Weight Gain and Weight Loss. Skin Not Present- Change in Wart/Mole, Dryness, Hives, Jaundice, New Lesions, Non-Healing Wounds, Rash and Ulcer. HEENT Present- Ringing in the Ears, Visual Disturbances and Wears glasses/contact lenses. Not Present- Earache, Hearing Loss, Hoarseness, Nose Bleed, Oral Ulcers, Seasonal Allergies, Sinus Pain, Sore Throat and Yellow Eyes. Respiratory Not Present- Bloody sputum, Chronic Cough, Difficulty Breathing, Snoring and Wheezing. Breast Not Present- Breast Mass, Breast Pain, Nipple Discharge and Skin Changes. Cardiovascular Not Present- Chest Pain, Difficulty Breathing Lying Down, Leg Cramps, Palpitations, Rapid Heart Rate, Shortness of Breath and Swelling of Extremities. Gastrointestinal Not Present- Abdominal Pain, Bloating, Bloody Stool, Change in Bowel Habits, Chronic diarrhea, Constipation, Difficulty Swallowing, Excessive gas, Gets full quickly at meals, Hemorrhoids, Indigestion, Nausea, Rectal Pain and Vomiting. Male Genitourinary Not Present- Blood in Urine, Change in Urinary Stream, Frequency, Impotence, Nocturia, Painful Urination, Urgency and Urine Leakage. Musculoskeletal Present- Joint Pain. Not Present- Back Pain, Joint Stiffness, Muscle Pain, Muscle Weakness and Swelling of Extremities. Neurological Not Present- Decreased Memory, Fainting, Headaches, Numbness, Seizures, Tingling, Tremor, Trouble walking and Weakness. Psychiatric Not Present- Anxiety, Bipolar, Change in Sleep Pattern,  Depression, Fearful and Frequent crying. Hematology Present- Blood Thinners and Easy Bruising. Not Present- Excessive bleeding, Gland problems, HIV and Persistent Infections.  Vitals  Weight: 160 lb Height: 67in Body Surface Area: 1.84 m Body Mass Index: 25.06 kg/m  Temp.: 97.85F  Pulse: 97 (Regular)  BP: 170/90 (Sitting, Left Arm, Standard)      Physical Exam   The physical exam findings are as follows:  Note:WDWN in NAD Eyes: Pupils equal, round; sclera anicteric HENT: Oral mucosa moist; good dentition Neck: No masses palpated, no thyromegaly Lungs: CTA bilaterally; normal respiratory effort CV: Regular rate and rhythm; no murmurs; extremities well-perfused with no edema Abd: +bowel sounds, soft, non-tender, no palpable organomegaly; small protruding umbilical hernia - easily reducible; mildly tender Skin: Warm, dry; no sign of jaundice Psychiatric - alert and oriented x 4; calm mood and affect    Assessment & Plan   UMBILICAL HERNIA WITHOUT OBSTRUCTION OR GANGRENE (K42.9)  Current Plans Schedule for Surgery - Umbilical hernia repair with mesh. The surgical procedure has been discussed with the patient. Potential risks, benefits, alternative treatments, and expected outcomes have been explained. All of the patient's questions at this time have been answered. The likelihood of reaching the patient's treatment goal is good. The patient understand the proposed surgical procedure and wishes to proceed.   Cardiac clearance first by Dr. Gwenlyn Found Hold Plavix 5 days prior to surgery    Imogene Burn. Georgette Dover, MD, [facility]  General/ Trauma Surgery  10/29/2017 7:12 AM

## 2017-10-29 NOTE — Transfer of Care (Signed)
Immediate Anesthesia Transfer of Care Note  Patient: [name]  Procedure(s) Performed: UMBILICAL HERNIA REPAIR (N/A Abdomen) INSERTION OF MESH (N/A Abdomen)  Patient Location: PACU  Anesthesia Type:General  Level of Consciousness: sedated  Airway & Oxygen Therapy: Patient Spontanous Breathing and Patient connected to face mask oxygen  Post-op Assessment: Report given to RN and Post -op Vital signs reviewed and stable  Post vital signs: Reviewed and stable  Last Vitals:  Vitals:   10/29/17 0557 10/29/17 0559  BP: 134/79 (!) 173/91  Pulse: 62   Resp: 18   Temp: 36.4 C   SpO2: 99%     Last Pain: There were no vitals filed for this visit.    Patients Stated Pain Goal: 3 (09/23/48 7530)  Complications: No apparent anesthesia complications

## 2017-10-29 NOTE — Discharge Instructions (Signed)
[facility]  UMBILICAL OR INGUINAL HERNIA REPAIR: POST OP INSTRUCTIONS  Always review your discharge instruction sheet given to you by the facility where your surgery was performed. IF YOU HAVE DISABILITY OR FAMILY LEAVE FORMS, YOU MUST BRING THEM TO THE OFFICE FOR PROCESSING.   DO NOT GIVE THEM TO YOUR DOCTOR.  1. A  prescription for pain medication may be given to you upon discharge.  Take your pain medication as prescribed, if needed.  If narcotic pain medicine is not needed, then you may take acetaminophen (Tylenol) or ibuprofen (Advil) as needed. 2. Take your usually prescribed medications unless otherwise directed. 3. If you need a refill on your pain medication, please contact your pharmacy.  They will contact our office to request authorization. Prescriptions will not be filled after 5 pm or on week-ends. 4. You should follow a light diet the first 24 hours after arrival home, such as soup and crackers, etc.  Be sure to include lots of fluids daily.  Resume your normal diet the day after surgery. 5. Most patients will experience some swelling and bruising around the umbilicus or in the groin and scrotum.  Ice packs and reclining will help.  Swelling and bruising can take several days to resolve.  6. It is common to experience some constipation if taking pain medication after surgery.  Increasing fluid intake and taking a stool softener (such as Colace) will usually help or prevent this problem from occurring.  A mild laxative (Milk of Magnesia or Miralax) should be taken according to package directions if there are no bowel movements after 48 hours. 7. Unless discharge instructions indicate otherwise, you may remove your bandages 24-48 hours after surgery, and you may shower at that time.  You will have steri-strips (small skin tapes) in place directly over the incision.  These strips should be left on the skin for 7-10 days. 8. ACTIVITIES:  You may resume regular (light)  daily activities beginning the next day--such as daily self-care, walking, climbing stairs--gradually increasing activities as tolerated.  You may have sexual intercourse when it is comfortable.  Refrain from any heavy lifting or straining until approved by your doctor. a. You may drive when you are no longer taking prescription pain medication, you can comfortably wear a seatbelt, and you can safely maneuver your car and apply brakes. b. RETURN TO WORK:  2-3 weeks with light duty - no lifting over 15 lbs. 9. You should see your doctor in the office for a follow-up appointment approximately 2-3 weeks after your surgery.  Make sure that you call for this appointment within a day or two after you arrive home to insure a convenient appointment time. 10. OTHER INSTRUCTIONS:  __________________________________________________________________________________________________________________________________________________________________________________________  WHEN TO CALL YOUR DOCTOR: 1. Fever over 101.0 2. Inability to urinate 3. Nausea and/or vomiting 4. Extreme swelling or bruising 5. Continued bleeding from incision. 6. Increased pain, redness, or drainage from the incision  The clinic staff is available to answer your questions during regular business hours.  Please don't hesitate to call and ask to speak to one of the nurses for clinical concerns.  If you have a medical emergency, go to the nearest emergency room or call 911.  A surgeon from [facility] is always on call at the hospital   [address], [address], [place], [place]  [address] ?  [address] [address], [place], [place]   [address] [phone]    [phone]    FAX [phone] Web   site: [email or web address]    

## 2017-10-30 ENCOUNTER — Encounter (HOSPITAL_COMMUNITY): Payer: Self-pay | Admitting: Surgery

## 2017-11-04 ENCOUNTER — Telehealth: Payer: Self-pay

## 2017-11-04 NOTE — Telephone Encounter (Addendum)
Call in Robaxin 500 mg bid #60 with no refill  February 5.2019-phone call from patient says he is having back pain.  Says that he did not want Robaxin called in yesterday.  He thinks a steroid Dosepak would help.  He feels it is musculoskeletal in nature.  Having trouble getting from a standing position when he is on the floor.  He went to try to drive his car and he had pain in his leg.  Does not recall any injury.  Recently had surgery for an incisional hernia.  Not running a fever.  No shaking chills.  We will call in a Medrol Dosepak and he is to call tomorrow to let us know how he is doing.

## 2017-11-04 NOTE — Telephone Encounter (Signed)
Patient called states he had umbilical hernia surgery last Tuesday, he is now having pain "joint in pelvis" he said "it's inflamed" and can't turn over in bed he said it is really hard to get in and out of the shower he said you have seen him for this in the past and prescribed methocarbamol. He would like to know if you can give him a prescription for methocarbamol or would like for him to come in?

## 2017-11-05 DIAGNOSIS — M545 Low back pain: Secondary | ICD-10-CM

## 2017-11-05 MED ORDER — METHYLPREDNISOLONE 4 MG PO TABS: ORAL_TABLET | ORAL | 0 refills | Status: DC

## 2017-11-05 MED FILL — oxyCODONE HCL 5 MG TABS: 5 | 5 days supply | Qty: 20 | Fill #0

## 2017-11-05 MED FILL — METHYLPREDNISOLONE 4 MG TAB: 4 | 6 days supply | Qty: 21 | Fill #0

## 2017-11-07 ENCOUNTER — Telehealth: Payer: Self-pay

## 2017-11-07 NOTE — Telephone Encounter (Signed)
Patient called to let you know that he is feeling better his back is better and his abd "is even better today" he was calling to tell you thank you for everything you've done for him.

## 2017-11-14 ENCOUNTER — Other Ambulatory Visit: Payer: Self-pay | Admitting: Cardiovascular Disease

## 2017-11-14 MED FILL — LOSARTAN POTASSIUM 50 MG TA: 50 | 30 days supply | Qty: 30 | Fill #0

## 2017-11-14 NOTE — Telephone Encounter (Signed)
REFILL 

## 2017-11-15 MED FILL — TRINTELLIX 5 MG TABLET: 5 | 30 days supply | Qty: 90 | Fill #3

## 2017-11-18 ENCOUNTER — Telehealth: Payer: Self-pay | Admitting: Cardiovascular Disease

## 2017-11-18 DIAGNOSIS — E785 Hyperlipidemia, unspecified: Secondary | ICD-10-CM

## 2017-11-18 NOTE — Telephone Encounter (Signed)
NEW MESSAGE     Patient calling with concerns about getting PRALUENT 150 MG/ML SOPN.  Pt c/o medication issue:  1. Name of Medication: PRALUENT 150 MG/ML SOPN  2. How are you currently taking this medication (dosage and times per day)? INJECT 150 MG INTO THE SKIN EVERY 14 DAYS.  3. Are you having a reaction (difficulty breathing--STAT)? No  4. What is your medication issue? Patient needing assistance with auth for refill

## 2017-11-19 NOTE — Telephone Encounter (Signed)
Follow up    [facility] called they want you to pre-authorize medication so they can fill the prescription, told pharmacy patient needs to come in for labs first, please call

## 2017-11-19 NOTE — Telephone Encounter (Signed)
LMOM for patient to come by office for lipid labs.  Will need current labs for renewal of Praluent rx

## 2017-11-20 DIAGNOSIS — E785 Hyperlipidemia, unspecified: Secondary | ICD-10-CM | POA: Diagnosis not present

## 2017-11-21 ENCOUNTER — Encounter: Payer: Self-pay | Admitting: Cardiovascular Disease

## 2017-11-21 DIAGNOSIS — E785 Hyperlipidemia, unspecified: Secondary | ICD-10-CM

## 2017-11-21 LAB — LIPID PANEL
CHOLESTEROL TOTAL: 181 mg/dL (ref 100–199)
Chol/HDL Ratio: 2.1 ratio (ref 0.0–5.0)
HDL: 85 mg/dL (ref >39)
LDL Calculated: 62 mg/dL (ref 0–99)
Triglycerides: 172 mg/dL — ABNORMAL HIGH (ref 0–149)
VLDL Cholesterol Cal: 34 mg/dL (ref 5–40)

## 2017-11-22 MED ORDER — ALIROCUMAB 150 MG/ML ~~LOC~~ SOPN: 150.0000 mg | PEN_INJECTOR | SUBCUTANEOUS | 5 refills | Status: DC

## 2017-11-25 MED FILL — PRALUENT 150 MG/ML PEN: 150 | 28 days supply | Qty: 2 | Fill #0

## 2017-11-26 NOTE — Telephone Encounter (Signed)
Labs done, sent to [facility] via [facility]

## 2017-11-27 ENCOUNTER — Other Ambulatory Visit: Payer: Self-pay | Admitting: Pharmacist Clinician (PhC)/ Clinical Pharmacy Specialist

## 2017-11-27 DIAGNOSIS — E785 Hyperlipidemia, unspecified: Secondary | ICD-10-CM

## 2017-11-27 MED ORDER — ALIROCUMAB 150 MG/ML ~~LOC~~ SOPN: 150.0000 mg | PEN_INJECTOR | SUBCUTANEOUS | 12 refills | Status: DC

## 2017-11-29 ENCOUNTER — Ambulatory Visit: Payer: 59 | Admitting: Internal Medicine

## 2017-11-29 ENCOUNTER — Encounter: Payer: Self-pay | Admitting: Internal Medicine

## 2017-11-29 VITALS — BP 162/88 | HR 74 | Temp 98.6°F | Ht 65.5 in | Wt 163.0 lb

## 2017-11-29 DIAGNOSIS — R0981 Nasal congestion: Secondary | ICD-10-CM

## 2017-11-29 DIAGNOSIS — B349 Viral infection, unspecified: Secondary | ICD-10-CM | POA: Diagnosis not present

## 2017-11-29 DIAGNOSIS — R110 Nausea: Secondary | ICD-10-CM | POA: Diagnosis not present

## 2017-11-29 LAB — POCT INFLUENZA A/B
Influenza A, POC: NEGATIVE
Influenza B, POC: NEGATIVE

## 2017-11-29 MED ORDER — AZITHROMYCIN 250 MG PO TABS: ORAL_TABLET | ORAL | 0 refills | Status: DC

## 2017-11-29 MED ORDER — ONDANSETRON HCL 4 MG/2ML IJ SOLN
4.0000 mg | Freq: Once | INTRAMUSCULAR | Status: AC
  Administered 2017-11-29: 4 mg via INTRAMUSCULAR

## 2017-11-29 MED ORDER — ONDANSETRON HCL 4 MG PO TABS: 4.0000 mg | ORAL_TABLET | Freq: Three times a day (TID) | ORAL | 0 refills | Status: DC | PRN

## 2017-11-29 MED FILL — ONDANSETRON HCL 4 MG TABLET: 4 | 7 days supply | Qty: 20 | Fill #0

## 2017-11-29 MED FILL — AZITHROMYCIN 250 MG TABLET: 250 | 5 days supply | Qty: 6 | Fill #0

## 2017-11-29 MED FILL — AMPHETAMINE SALTS 30 MG TAB: 30 | 90 days supply | Qty: 270 | Fill #0

## 2017-11-29 NOTE — Progress Notes (Signed)
   Subjective:    Patient ID: [name], male    DOB: 1950-10-18, 67 y.o.   MRN: [id number]  HPI 67 year old white male in today with complaint of nausea and emesis.  No fever or shaking chills.  Thinks that some of this is aggravated by some postnasal drip perhaps.  Cannot be more specific.    Review of Systems     Objective:   Physical Exam Rapid influenza test is negative.       Assessment & Plan:  Nausea  Postnasal drip-?URI  Probable viral syndrome  Plan: He will be given Zofran 4 mg every 8 hours as needed nausea.  Was given 4 mg Zofran IM in office.  Take Zithromax Z-Pak 2 tablets day 1 followed by 1 tablet days 2 through 5.  Stay with clear liquids well having nausea and vomiting.  Call if not better in 24 hours or sooner if worse.

## 2017-12-21 NOTE — Patient Instructions (Signed)
Take Zithromax Z-Pak as directed.  Zofran tablets as needed for nausea.  Zofran IM given in office.  Stay with clear liquids and advance diet slowly.  Call if not better in 24 hours or sooner if worse

## 2017-12-23 ENCOUNTER — Other Ambulatory Visit: Payer: Self-pay | Admitting: Internal Medicine

## 2017-12-23 MED FILL — PRALUENT 150 MG/ML PEN: 150 | 28 days supply | Qty: 2 | Fill #1

## 2017-12-23 MED FILL — CLOPIDOGREL 75 MG TABLET: 75 | 90 days supply | Qty: 90 | Fill #2

## 2017-12-23 MED FILL — PANTOPRAZOLE SOD DR 40 MG T: 40 | 90 days supply | Qty: 90 | Fill #2

## 2017-12-23 MED FILL — AMLODIPINE BESYLATE 10 MG T: 10 | 90 days supply | Qty: 90 | Fill #3

## 2017-12-23 MED FILL — HYDROCHLOROTHIAZIDE 12.5 MG: 12.5 | 90 days supply | Qty: 90 | Fill #3

## 2017-12-23 MED FILL — LEVOTHYROXINE 75 MCG TABLET: 75 | 90 days supply | Qty: 90 | Fill #0

## 2017-12-24 DIAGNOSIS — M25511 Pain in right shoulder: Secondary | ICD-10-CM | POA: Diagnosis not present

## 2017-12-24 MED FILL — LOSARTAN POTASSIUM 50 MG TA: 50 | 90 days supply | Qty: 90 | Fill #1

## 2018-01-06 MED FILL — TRINTELLIX 5 MG TABLET: 5 | 30 days supply | Qty: 90 | Fill #0

## 2018-01-16 ENCOUNTER — Telehealth: Payer: Self-pay

## 2018-01-16 DIAGNOSIS — Z1211 Encounter for screening for malignant neoplasm of colon: Secondary | ICD-10-CM | POA: Diagnosis not present

## 2018-01-16 DIAGNOSIS — R1310 Dysphagia, unspecified: Secondary | ICD-10-CM | POA: Diagnosis not present

## 2018-01-16 DIAGNOSIS — R110 Nausea: Secondary | ICD-10-CM | POA: Diagnosis not present

## 2018-01-16 DIAGNOSIS — K219 Gastro-esophageal reflux disease without esophagitis: Secondary | ICD-10-CM | POA: Diagnosis not present

## 2018-01-16 NOTE — Telephone Encounter (Signed)
   [name] Pre-operative Risk Assessment    Request for surgical clearance:  1. What type of surgery is being performed? EGD with dilation   2. When is this surgery scheduled? TBD  3. What type of clearance is required (medical clearance vs. Pharmacy clearance to hold med vs. Both)? Pharmacy  4. Are there any medications that need to be held prior to surgery and how long? Plavix  5. Practice name and name of physician performing surgery? [facility]  6. What is your office phone number [phone]   7.   What is your office fax number [phone]  8.   Anesthesia type (None, local, MAC, general) ? Propofol   [name] [name] 01/16/2018, 4:16 PM  _________________________________________________________________   (provider comments below)

## 2018-01-16 NOTE — Telephone Encounter (Signed)
  Will route to Dr. Gwenlyn Found to see if ok to hold Plavix for EGD and, if so, how long.

## 2018-01-17 ENCOUNTER — Telehealth: Payer: Self-pay | Admitting: Cardiology

## 2018-01-17 ENCOUNTER — Other Ambulatory Visit: Payer: Self-pay | Admitting: Cardiovascular Disease

## 2018-01-17 MED FILL — METOPROLOL SUCCINATE ER 50: 50 | 90 days supply | Qty: 90 | Fill #0

## 2018-01-17 NOTE — Telephone Encounter (Signed)
OK to interrupt anti platelet Rx 

## 2018-01-17 NOTE — Telephone Encounter (Signed)
   Primary Cardiologist: Quay Burow, MD  Chart reviewed as part of pre-operative protocol coverage and patient interviewed over the phone. Given past medical history and time since last visit, based on ACC/AHA guidelines, [name] would be at acceptable risk for the planned procedure without further cardiovascular testing.   He can hold Plavix 5 days pre op if needed, resume ASAP post op.  I will route this recommendation to the requesting party via Epic fax function and remove from pre-op pool.  Please call with questions.  Kerin Ransom, PA-C 01/17/2018, 1:44 PM

## 2018-01-17 NOTE — Telephone Encounter (Signed)
REFILL 

## 2018-01-21 MED FILL — GAVILYTE-G SOLUTION: 236 | 1 days supply | Qty: 4000 | Fill #0

## 2018-01-21 MED FILL — PRALUENT 150 MG/ML PEN: 150 | 28 days supply | Qty: 2 | Fill #2

## 2018-01-27 ENCOUNTER — Other Ambulatory Visit: Payer: Self-pay | Admitting: Gastroenterology

## 2018-01-30 MED FILL — LORazepam 1 MG TABS: 1 | 90 days supply | Qty: 360 | Fill #1

## 2018-02-04 ENCOUNTER — Encounter (HOSPITAL_COMMUNITY): Payer: Self-pay

## 2018-02-04 ENCOUNTER — Ambulatory Visit (HOSPITAL_COMMUNITY): Admit: 2018-02-04 | Payer: 59 | Admitting: Gastroenterology

## 2018-02-04 SURGERY — ESOPHAGOGASTRODUODENOSCOPY (EGD) WITH PROPOFOL
Anesthesia: Monitor Anesthesia Care

## 2018-02-17 ENCOUNTER — Other Ambulatory Visit: Payer: Self-pay

## 2018-02-17 ENCOUNTER — Encounter (HOSPITAL_COMMUNITY): Payer: Self-pay | Admitting: *Deleted

## 2018-02-20 ENCOUNTER — Ambulatory Visit (HOSPITAL_COMMUNITY): Payer: 59 | Admitting: Anesthesiology

## 2018-02-20 ENCOUNTER — Encounter (HOSPITAL_COMMUNITY)
Admission: RE | Disposition: A | Discharge status: Home / Self Care | Payer: Self-pay | Source: Ambulatory Visit | Attending: Gastroenterology

## 2018-02-20 ENCOUNTER — Ambulatory Visit (HOSPITAL_COMMUNITY)
Admission: RE | Admit: 2018-02-20 | Discharge: 2018-02-20 | Disposition: A | Discharge status: Home / Self Care | Payer: 59 | Source: Ambulatory Visit | Attending: Gastroenterology | Admitting: Gastroenterology

## 2018-02-20 ENCOUNTER — Encounter (HOSPITAL_COMMUNITY): Payer: Self-pay

## 2018-02-20 DIAGNOSIS — J449 Chronic obstructive pulmonary disease, unspecified: Secondary | ICD-10-CM | POA: Diagnosis not present

## 2018-02-20 DIAGNOSIS — R1310 Dysphagia, unspecified: Secondary | ICD-10-CM | POA: Diagnosis not present

## 2018-02-20 DIAGNOSIS — K219 Gastro-esophageal reflux disease without esophagitis: Secondary | ICD-10-CM | POA: Insufficient documentation

## 2018-02-20 DIAGNOSIS — Z8041 Family history of malignant neoplasm of ovary: Secondary | ICD-10-CM | POA: Insufficient documentation

## 2018-02-20 DIAGNOSIS — Z8673 Personal history of transient ischemic attack (TIA), and cerebral infarction without residual deficits: Secondary | ICD-10-CM | POA: Insufficient documentation

## 2018-02-20 DIAGNOSIS — D128 Benign neoplasm of rectum: Secondary | ICD-10-CM | POA: Diagnosis not present

## 2018-02-20 DIAGNOSIS — K228 Other specified diseases of esophagus: Secondary | ICD-10-CM | POA: Diagnosis not present

## 2018-02-20 DIAGNOSIS — I2510 Atherosclerotic heart disease of native coronary artery without angina pectoris: Secondary | ICD-10-CM | POA: Diagnosis not present

## 2018-02-20 DIAGNOSIS — F419 Anxiety disorder, unspecified: Secondary | ICD-10-CM | POA: Insufficient documentation

## 2018-02-20 DIAGNOSIS — M170 Bilateral primary osteoarthritis of knee: Secondary | ICD-10-CM | POA: Insufficient documentation

## 2018-02-20 DIAGNOSIS — Z955 Presence of coronary angioplasty implant and graft: Secondary | ICD-10-CM | POA: Insufficient documentation

## 2018-02-20 DIAGNOSIS — Z79899 Other long term (current) drug therapy: Secondary | ICD-10-CM | POA: Insufficient documentation

## 2018-02-20 DIAGNOSIS — I10 Essential (primary) hypertension: Secondary | ICD-10-CM | POA: Insufficient documentation

## 2018-02-20 DIAGNOSIS — M19042 Primary osteoarthritis, left hand: Secondary | ICD-10-CM | POA: Insufficient documentation

## 2018-02-20 DIAGNOSIS — Z85810 Personal history of malignant neoplasm of tongue: Secondary | ICD-10-CM | POA: Diagnosis not present

## 2018-02-20 DIAGNOSIS — M479 Spondylosis, unspecified: Secondary | ICD-10-CM | POA: Insufficient documentation

## 2018-02-20 DIAGNOSIS — K648 Other hemorrhoids: Secondary | ICD-10-CM | POA: Insufficient documentation

## 2018-02-20 DIAGNOSIS — Z833 Family history of diabetes mellitus: Secondary | ICD-10-CM | POA: Diagnosis not present

## 2018-02-20 DIAGNOSIS — Z951 Presence of aortocoronary bypass graft: Secondary | ICD-10-CM | POA: Diagnosis not present

## 2018-02-20 DIAGNOSIS — E039 Hypothyroidism, unspecified: Secondary | ICD-10-CM | POA: Insufficient documentation

## 2018-02-20 DIAGNOSIS — E785 Hyperlipidemia, unspecified: Secondary | ICD-10-CM | POA: Insufficient documentation

## 2018-02-20 DIAGNOSIS — Z8249 Family history of ischemic heart disease and other diseases of the circulatory system: Secondary | ICD-10-CM | POA: Insufficient documentation

## 2018-02-20 DIAGNOSIS — Z8261 Family history of arthritis: Secondary | ICD-10-CM | POA: Insufficient documentation

## 2018-02-20 DIAGNOSIS — Z1211 Encounter for screening for malignant neoplasm of colon: Secondary | ICD-10-CM | POA: Diagnosis not present

## 2018-02-20 DIAGNOSIS — F329 Major depressive disorder, single episode, unspecified: Secondary | ICD-10-CM | POA: Diagnosis not present

## 2018-02-20 DIAGNOSIS — M19041 Primary osteoarthritis, right hand: Secondary | ICD-10-CM | POA: Diagnosis not present

## 2018-02-20 DIAGNOSIS — Z87891 Personal history of nicotine dependence: Secondary | ICD-10-CM | POA: Insufficient documentation

## 2018-02-20 DIAGNOSIS — K621 Rectal polyp: Secondary | ICD-10-CM | POA: Diagnosis not present

## 2018-02-20 DIAGNOSIS — G43909 Migraine, unspecified, not intractable, without status migrainosus: Secondary | ICD-10-CM | POA: Insufficient documentation

## 2018-02-20 DIAGNOSIS — Z823 Family history of stroke: Secondary | ICD-10-CM | POA: Diagnosis not present

## 2018-02-20 DIAGNOSIS — I739 Peripheral vascular disease, unspecified: Secondary | ICD-10-CM | POA: Insufficient documentation

## 2018-02-20 DIAGNOSIS — Z888 Allergy status to other drugs, medicaments and biological substances status: Secondary | ICD-10-CM | POA: Insufficient documentation

## 2018-02-20 HISTORY — PX: ESOPHAGOGASTRODUODENOSCOPY (EGD) WITH PROPOFOL: SHX5813

## 2018-02-20 HISTORY — PX: COLONOSCOPY WITH PROPOFOL: SHX5780

## 2018-02-20 HISTORY — PX: BIOPSY: SHX5522

## 2018-02-20 HISTORY — PX: POLYPECTOMY: SHX5525

## 2018-02-20 SURGERY — ESOPHAGOGASTRODUODENOSCOPY (EGD) WITH PROPOFOL
Anesthesia: Monitor Anesthesia Care

## 2018-02-20 MED ORDER — PROPOFOL 10 MG/ML IV BOLUS
INTRAVENOUS | Status: DC | PRN
  Administered 2018-02-20 (×5): 50 mg via INTRAVENOUS

## 2018-02-20 MED ORDER — EPHEDRINE SULFATE-NACL 50-0.9 MG/10ML-% IV SOSY
PREFILLED_SYRINGE | INTRAVENOUS | Status: DC | PRN
  Administered 2018-02-20 (×2): 5 mg via INTRAVENOUS

## 2018-02-20 MED ORDER — LACTATED RINGERS IV SOLN
INTRAVENOUS | Status: DC
  Administered 2018-02-20: 07:00:00 via INTRAVENOUS
  Administered 2018-02-20: 1000 mL via INTRAVENOUS

## 2018-02-20 MED ORDER — LIDOCAINE 2% (20 MG/ML) 5 ML SYRINGE
INTRAMUSCULAR | Status: DC | PRN
  Administered 2018-02-20: 60 mg via INTRAVENOUS

## 2018-02-20 MED ORDER — PROPOFOL 10 MG/ML IV BOLUS
INTRAVENOUS | Status: AC
  Filled 2018-02-20: qty 40

## 2018-02-20 SURGICAL SUPPLY — 25 items

## 2018-02-20 NOTE — Discharge Instructions (Signed)
Colonoscopy, Adult, Care After °This sheet gives you information about how to care for yourself after your procedure. Your doctor may also give you more specific instructions. If you have problems or questions, call your doctor. °Follow these instructions at home: °General instructions ° °· For the first 24 hours after the procedure: °? Do not drive or use machinery. °? Do not sign important documents. °? Do not drink alcohol. °? Do your daily activities more slowly than normal. °? Eat foods that are soft and easy to digest. °? Rest often. °· Take over-the-counter or prescription medicines only as told by your doctor. °· It is up to you to get the results of your procedure. Ask your doctor, or the department performing the procedure, when your results will be ready. °To help cramping and bloating: °· Try walking around. °· Put heat on your belly (abdomen) as told by your doctor. Use a heat source that your doctor recommends, such as a moist heat pack or a heating pad. °? Put a towel between your skin and the heat source. °? Leave the heat on for 20-30 minutes. °? Remove the heat if your skin turns bright red. This is especially important if you cannot feel pain, heat, or cold. You can get burned. °Eating and drinking °· Drink enough fluid to keep your pee (urine) clear or pale yellow. °· Return to your normal diet as told by your doctor. Avoid heavy or fried foods that are hard to digest. °· Avoid drinking alcohol for as long as told by your doctor. °Contact a doctor if: °· You have blood in your poop (stool) 2-3 days after the procedure. °Get help right away if: °· You have more than a small amount of blood in your poop. °· You see large clumps of tissue (blood clots) in your poop. °· Your belly is swollen. °· You feel sick to your stomach (nauseous). °· You throw up (vomit). °· You have a fever. °· You have belly pain that gets worse, and medicine does not help your pain. °This information is not intended to  replace advice given to you by your health care provider. Make sure you discuss any questions you have with your health care provider. °Document Released: 10/20/2010 Document Revised: 06/11/2016 Document Reviewed: 06/11/2016 °Elsevier Interactive Patient Education © 2017 [employer] °Esophagogastroduodenoscopy, Care After °Refer to this sheet in the next few weeks. These instructions provide you with information about caring for yourself after your procedure. Your health care provider may also give you more specific instructions. Your treatment has been planned according to current medical practices, but problems sometimes occur. Call your health care provider if you have any problems or questions after your procedure. °What can I expect after the procedure? °After the procedure, it is common to have: °· A sore throat. °· Nausea. °· Bloating. °· Dizziness. °· Fatigue. ° °Follow these instructions at home: °· Do not eat or drink anything until the numbing medicine (local anesthetic) has worn off and your gag reflex has returned. You will know that the local anesthetic has worn off when you can swallow comfortably. °· Do not drive for 24 hours if you received a medicine to help you relax (sedative). °· If your health care provider took a tissue sample for testing during the procedure, make sure to get your test results. This is your responsibility. Ask your health care provider or the department performing the test when your results will be ready. °· Keep all follow-up visits as told   by your health care provider. This is important. °Contact a health care provider if: °· You cannot stop coughing. °· You are not urinating. °· You are urinating less than usual. °Get help right away if: °· You have trouble swallowing. °· You cannot eat or drink. °· You have throat or chest pain that gets worse. °· You are dizzy or light-headed. °· You faint. °· You have nausea or vomiting. °· You have chills. °· You have a fever. °· You  have severe abdominal pain. °· You have black, tarry, or bloody stools. °This information is not intended to replace advice given to you by your health care provider. Make sure you discuss any questions you have with your health care provider. °Document Released: 09/03/2012 Document Revised: 02/23/2016 Document Reviewed: 08/11/2015 °Elsevier Interactive Patient Education © 2018 [employer] ° °

## 2018-02-20 NOTE — Anesthesia Procedure Notes (Signed)
Procedure Name: MAC Date/Time: 02/20/2018 7:30 AM Performed by: Cynda Familia, CRNA Pre-anesthesia Checklist: Patient identified, Emergency Drugs available, Suction available, Patient being monitored and Timeout performed Patient Re-evaluated:Patient Re-evaluated prior to induction Oxygen Delivery Method: Nasal cannula Placement Confirmation: positive ETCO2 and breath sounds checked- equal and bilateral Dental Injury: Teeth and Oropharynx as per pre-operative assessment

## 2018-02-20 NOTE — Op Note (Signed)
[facility] Patient Name: [name] Procedure Date: 02/20/2018 MRN: [id number] Attending MD: Juanita Craver , MD Date of Birth: 1951/02/27 CSN: [id number] Age: 67 Admit Type: Outpatient Procedure:                Colonoscopy with cold biopsies. Indications:              CRC screening for colorectal malignant neoplasm. Providers:                Juanita Craver, MD, Carmie End, RN, Tinnie Gens,                            Technician, Glenis Smoker, CRNA Referring MD:             Cresenciano Lick. Renold Genta, MD Medicines:                Monitored Anesthesia Care Complications:            No immediate complications. Estimated Blood Loss:     Estimated blood loss was minimal. Procedure:                Pre-anesthesia assessment: - Prior to the                            procedure, a history and physical was performed,                            and patient medications and allergies were                            reviewed. The patient's tolerance of previous                            anesthesia was also reviewed. The risks and                            benefits of the procedure and the sedation options                            and risks were discussed with the patient. All                            questions were answered, and informed consent was                            obtained. Prior Anticoagulants: The patient has                            taken Plavix (Clopidogrel), last dose was 4 days                            prior to procedure. ASA Grade assessment: III - A                            patient with severe systemic disease. After  reviewing the risks and benefits, the patient was                            deemed in satisfactory condition to undergo the                            procedure. - Prior to the procedure, a History and                            Physical was performed, and patient medications and                            allergies  were reviewed. The patient's tolerance of                            previous anesthesia was also reviewed. The risks                            and benefits of the procedure and the sedation                            options and risks were discussed with the patient.                            All questions were answered, and informed consent                            was obtained. Prior Anticoagulants: The patient has                            taken Aspirin and Plavix (clopidogrel), last dose                            was 4 days prior to procedure. ASA Grade                            assessment: III - A patient with severe systemic                            disease. After reviewing the risks and benefits,                            the patient was deemed in satisfactory condition to                            undergo the procedure. After obtaining informed                            consent, the colonoscope was passed under direct                            vision. Throughout the procedure, the patient's  blood pressure, pulse, and oxygen saturations were                            monitored continuously. The EC-[id number] ([id number])                            scope was introduced through the anus and advanced                            to the the cecum, identified by appendiceal orifice                            and ileocecal valve. The colonoscopy was performed                            without difficulty. The patient tolerated the                            procedure well. The quality of the bowel                            preparation was adequate. The ileocecal valve, the                            appendiceal orifice and the rectum were                            photographed. The bowel preparation used was                            GoLYTELY. Scope In: 8:84:16 AM Scope Out: 7:59:30 AM Scope Withdrawal Time: 0 hours 8 minutes 19 seconds  Total  Procedure Duration: 0 hours 12 minutes 44 seconds  Findings:      Three small sessile polyps were found in the rectum-these were removed       by cold biopsies.      Small internal hemorrhoids were noted on retroflexion.      The exam was otherwise without abnormality. Impression:               - Three small sesile polyps in the rectum-these                            were removed by cold biopsies.                           - Small internal hemorrhoids.                           - The examination was otherwise normal. Moderate Sedation:      MAC used. Recommendation:           - High fiber diet with augmented water consumption                            daily.                           -  Continue present medications.                           - Await pathology results.                           - Repeat colonoscopy in 5-10 years for surveillance.                           - Return to GI office in 2 weeks.                           - If the patient has any abnormal GI symptoms in                            the interim, he has been advised to call the office                            ASAP for further recommendations. Procedure Code(s):        --- Professional ---                           [phone], Colonoscopy, flexible; with biopsy, single                            or multiple Diagnosis Code(s):        --- Professional ---                           Z12.11, Encounter for screening for malignant                            neoplasm of colon                           D12.8, Benign neoplasm of rectum CPT copyright 2017 American [employer]. All rights reserved. The codes documented in this report are preliminary and upon coder review may  be revised to meet current compliance requirements. Juanita Craver, MD Juanita Craver, MD 02/20/2018 8:24:22 AM This report has been signed electronically. Number of Addenda: 0

## 2018-02-20 NOTE — Anesthesia Postprocedure Evaluation (Signed)
Anesthesia Post Note  Patient: [name]  Procedure(s) Performed: ESOPHAGOGASTRODUODENOSCOPY (EGD) WITH PROPOFOL (N/A ) COLONOSCOPY WITH PROPOFOL (N/A )     Patient location during evaluation: PACU Anesthesia Type: MAC Level of consciousness: awake and alert Pain management: pain level controlled Vital Signs Assessment: post-procedure vital signs reviewed and stable Respiratory status: spontaneous breathing, nonlabored ventilation and respiratory function stable Cardiovascular status: stable and blood pressure returned to baseline Postop Assessment: no apparent nausea or vomiting Anesthetic complications: no    Last Vitals:  Vitals:   02/20/18 0628 02/20/18 0811  BP: 121/88 116/70  Pulse: 88 70  Resp: 18 20  Temp: 36.5 C (!) 36.3 C  SpO2: 96% 100%    Last Pain:  Vitals:   02/20/18 0820  TempSrc:   PainSc: 0-No pain                 Lynda Rainwater

## 2018-02-20 NOTE — Transfer of Care (Signed)
Immediate Anesthesia Transfer of Care Note  Patient: [name]  Procedure(s) Performed: ESOPHAGOGASTRODUODENOSCOPY (EGD) WITH PROPOFOL (N/A ) COLONOSCOPY WITH PROPOFOL (N/A )  Patient Location: PACU and Endoscopy Unit  Anesthesia Type:MAC  Level of Consciousness: sedated  Airway & Oxygen Therapy: Patient Spontanous Breathing and Patient connected to nasal cannula oxygen  Post-op Assessment: Report given to RN and Post -op Vital signs reviewed and stable  Post vital signs: Reviewed and stable  Last Vitals:  Vitals Value Taken Time  BP    Temp    Pulse    Resp    SpO2      Last Pain:  Vitals:   02/20/18 0628  TempSrc: Oral  PainSc: 0-No pain         Complications: No apparent anesthesia complications

## 2018-02-20 NOTE — Anesthesia Preprocedure Evaluation (Signed)
Anesthesia Evaluation  Patient identified by MRN, date of birth, ID band Patient awake    Reviewed: Allergy & Precautions, H&P , NPO status , Patient's Chart, lab work & pertinent test results, reviewed documented beta blocker date and time   Airway Mallampati: II  TM Distance: >3 FB Neck ROM: Limited    Dental no notable dental hx. (+) Teeth Intact, Dental Advisory Given   Pulmonary COPD, former smoker,    Pulmonary exam normal breath sounds clear to auscultation       Cardiovascular Exercise Tolerance: Good hypertension, Pt. on medications and Pt. on home beta blockers + CAD, + CABG and + Peripheral Vascular Disease   Rhythm:Regular Rate:Normal     Neuro/Psych  Headaches, Anxiety Depression TIAnegative psych ROS   GI/Hepatic Neg liver ROS, GERD  Medicated and Controlled,  Endo/Other  Hypothyroidism   Renal/GU negative Renal ROS  negative genitourinary   Musculoskeletal  (+) Arthritis , Osteoarthritis,    Abdominal   Peds  Hematology negative hematology ROS (+)   Anesthesia Other Findings   Reproductive/Obstetrics negative OB ROS                             Anesthesia Physical  Anesthesia Plan  ASA: III  Anesthesia Plan: MAC   Post-op Pain Management:    Induction: Intravenous  PONV Risk Score and Plan: 2 and Ondansetron, Midazolam and Treatment may vary due to age or medical condition  Airway Management Planned: Nasal Cannula  Additional Equipment:   Intra-op Plan:   Post-operative Plan:   Informed Consent: I have reviewed the patients History and Physical, chart, labs and discussed the procedure including the risks, benefits and alternatives for the proposed anesthesia with the patient or authorized representative who has indicated his/her understanding and acceptance.   Dental advisory given  Plan Discussed with: CRNA  Anesthesia Plan Comments:          Anesthesia Quick Evaluation

## 2018-02-20 NOTE — Op Note (Signed)
[facility] Patient Name: [name] Procedure Date: 02/20/2018 MRN: [id number] Attending MD: Juanita Craver , MD Date of Birth: August 23, 1951 CSN: [id number] Age: 67 Admit Type: Outpatient Procedure:                EGD with esophageal biopsies. Indications:              Dysphagia, Gastro-esophageal reflux disease,                            History of SCC of the valeculla s/ pradiation                            therapy. Providers:                Juanita Craver, MD, Carmie End, RN, Tinnie Gens,                            Technician, Glenis Smoker, CRNA Referring MD:             Cresenciano Lick. [place] Medicines:                Monitored Anesthesia Care Complications:            No immediate complications. Estimated Blood Loss:     Estimated blood loss was minimal. Procedure:                Pre-anesthesia assessment: - Prior to the                            procedure, a history and physical was performed,                            and patient medications and allergies were                            reviewed. The patient's tolerance of previous                            anesthesia was also reviewed. The risks and                            benefits of the procedure and the sedation options                            and risks were discussed with the patient. All                            questions were answered, and informed consent was                            obtained. Prior anticoagulants: The patient has                            taken Aspirin and Plavix (clopidogrel), last dose  was 4 days prior to procedure. ASA Grade                            Assessment: III - A patient with severe systemic                            disease. After reviewing the risks and benefits,                            the patient was deemed in satisfactory condition to                            undergo the procedure. After obtaining informed        consent, the endoscope was passed under direct                            vision. Throughout the procedure, the patient's                            blood pressure, pulse, and oxygen saturations were                            monitored continuously. The [id number])                            scope was introduced through the mouth, and                            advanced to the second part of duodenum. The EGD                            was accomplished without difficulty. The patient                            tolerated the procedure well. Scope In: Scope Out: Findings:      Mucosal changes including ringed esophagus and longitudinal furrows were       found in the entire esophagus. Esophageal findings were graded using the       Eosinophilic Esophagitis Endoscopic Reference Score (EoE-EREFS) as:       Edema Grade 0 Normal (distinct vascular markings), Rings Grade 1 Mild       (subtle circumferential ridges seen on esophageal distension), Exudates       Grade 0 None (no white lesions seen), Furrows Grade 1 Present (vertical       lines with or without visible depth) and Stricture none (no stricture       found). Biopsies were taken with a cold forceps for histology; the       esophagus and GEJ were widely patent.      The entire examined stomach was normal.      The examined duodenum was normal. Impression:               - Esophageal mucosal changes suggestive of  Eosinophilic Esophagitis-biopsies done.                           - Normal stomach.                           - Normal examined duodenum. Moderate Sedation:      MAC used. Recommendation:           - High fiber diet with augmented water consumption                            daily.                           - Continue present medications.                           - Await pathology results.                           - Return to my office in 2 weeks. Procedure Code(s):        ---  Professional ---                           [phone], Esophagogastroduodenoscopy, flexible,                            transoral; with biopsy, single or multiple Diagnosis Code(s):        --- Professional ---                           R13.10, Dysphagia, unspecified                           K21.9, Gastro-esophageal reflux disease without                            esophagitis                           K22.8, Other specified diseases of esophagus CPT copyright 2017 American Medical Association. All rights reserved. The codes documented in this report are preliminary and upon coder review may  be revised to meet current compliance requirements. Juanita Craver, MD Juanita Craver, MD 02/20/2018 8:15:00 AM This report has been signed electronically. Number of Addenda: 0

## 2018-02-20 NOTE — H&P (Signed)
[name] is an 67 y.o. male.   Chief Complaint: Difficulty swallowing/Colorectal cancer screening. HPI: 67 year old white male here for an EGD/Colonoscopy. See office notes for details.  Past Medical History:  Diagnosis Date  . Anxiety   . Arthritis    "NECK, HANDS, KNEES" (06/14/2016)  . CAD (coronary artery disease)   . Carotid artery stenosis   . Complication of anesthesia    Reports that he had trouble swallowing post anesthesia- CABG  . COPD (chronic obstructive pulmonary disease) ([place]) 12/11/2011   r/s mv - EF 72%; exercise capacity 13 METS; no exercised induced ischemic EKG changes  . Depression, major   . GERD (gastroesophageal reflux disease)   . HTN (hypertension), benign 04/16/2008   echo - EF >55%; no regional wall or valvular abnormalities  . Hyperlipidemia    statin intolerant  . Hypothyroidism   . Migraine    "crippling headaches as a kid; silent migraines now, no pain, couple times/week" (06/14/2016)  . S/P angioplasty with stent 06/14/16 PCI & DES of high grade AV groove LCX and 1st OM  06/15/2016  . Squamous cell cancer of tongue ([place]) ~ 2002   "35 radiation treatments at [facility]  . Subclavian steal syndrome 06/17/2012   carotid doppler - R systolic brachial pressure 161mmHg, L 151mmHg; R subclavian artery - proxmial obsstruction w/ abnormal monophasic waveforms, R ECA known occlusive disease; L ECA narrowing w/ 70-99% diameter reductiona  . TIA (transient ischemic attack) 2005  . Viral hepatitis 1970s   "non specific"   Past Surgical History:  Procedure Laterality Date  . BACK SURGERY    . BIOPSY TONGUE  ~ 2002   "base of tongue and uvula"  . CARDIAC CATHETERIZATION  04/26/2008   L circumflex 99% stenosed midportion straddling a marginal branch; RCA total priximally w/ grade 2 L to R collaterals; renal arteries widely patent; LAD 30% segmental proximal stenosis  . CARDIAC CATHETERIZATION N/A 05/14/2016   Procedure: Left Heart Cath and Cors/Grafts  Angiography;  Surgeon: [name], MD;  Location: [facility];  Service: Cardiovascular;  Laterality: N/A;  . CARDIAC CATHETERIZATION N/A 06/14/2016   Procedure: Coronary Stent Intervention;  Surgeon: [name], MD;  Location: [facility];  Service: Cardiovascular;  Laterality: N/A;  . [place]   [place]  . CORONARY ANGIOPLASTY WITH STENT PLACEMENT    . CORONARY ARTERY BYPASS GRAFT  2009   x5; LIMA to LAd, sequential vein to first diagonal branch, first and second obtuse marginal branches; vein to PDA  . INSERTION OF MESH N/A 10/29/2017   Procedure: INSERTION OF MESH;  Surgeon: Donnie Mesa, MD;  Location: [facility];  Service: General;  Laterality: N/A;  . SHOULDER ARTHROSCOPY WITH SUBACROMIAL DECOMPRESSION AND OPEN ROTATOR C Left 09/28/2013   Procedure: LEFT SHOULDER ARTHROSCOPY WITH SUBACROMIAL DECOMPRESSION AND MINI OPEN ROTATOR CUFF REPAIR, OPEN DISTAL CLAVICLE RESECTION AND OPEN BICEP TENDODESIS ;  Surgeon: Augustin Schooling, MD;  Location: [facility];  Service: Orthopedics;  Laterality: Left;  . UMBILICAL HERNIA REPAIR N/A 10/29/2017   Procedure: UMBILICAL HERNIA REPAIR;  Surgeon: Donnie Mesa, MD;  [facility];  Service: General;  Laterality: N/A;   Family History  Problem Relation Age of Onset  . Cancer Mother        ovarian  . Stroke Father   . Diabetes Father   . Arthritis Father   . Hypertension Brother    Social History:  reports that he  quit smoking about 17 years ago. His smoking use included cigarettes. He has a 90.00 pack-year smoking history. He has never used smokeless tobacco. He reports that he drinks about 21.0 oz of alcohol per week. He reports that he does not use drugs.  Allergies:  Allergies  Allergen Reactions  . Crestor [Rosuvastatin Calcium] Other (See Comments)    Difficulty swallowing  . Tramadol Shortness Of Breath  . Lisinopril Cough  . Tricor [Fenofibrate] Other (See Comments)    UNSPECIFIED REACTION  .  Lipitor [Atorvastatin Calcium] Other (See Comments)    Muscle spasms   Medications Prior to Admission  Medication Sig Dispense Refill  . Alirocumab (PRALUENT) 150 MG/ML SOPN Inject 150 mg into the skin every 14 (fourteen) days. 2 pen 12  . amLODipine (NORVASC) 10 MG tablet TAKE 1 TABLET BY MOUTH DAILY 90 tablet 3  . amphetamine-dextroamphetamine (ADDERALL) 30 MG tablet Take 30 mg by mouth 3 (three) times daily.    Marland Kitchen aspirin 81 MG tablet Take 81 mg by mouth daily.    . clopidogrel (PLAVIX) 75 MG tablet TAKE 1 TABLET (75 MG TOTAL) BY MOUTH DAILY. 90 tablet 3  . hydrochlorothiazide (MICROZIDE) 12.5 MG capsule TAKE ONE CAPSULE BY MOUTH DAILY 90 capsule 3  . levothyroxine (SYNTHROID, LEVOTHROID) 75 MCG tablet TAKE 1 TABLET BY MOUTH DAILY 90 tablet 0  . loratadine (CLARITIN) 10 MG tablet Take 10 mg by mouth 2 (two) times daily as needed for allergies.     Marland Kitchen LORazepam (ATIVAN) 1 MG tablet Take 1 tablet (1 mg total) by mouth every 8 (eight) hours as needed for anxiety. (Patient taking differently: Take 1 mg by mouth 4 (four) times daily as needed for anxiety. ) 90 tablet 5  . losartan (COZAAR) 50 MG tablet TAKE 1 TABLET BY MOUTH DAILY 30 tablet 9  . metoprolol succinate (TOPROL-XL) 50 MG 24 hr tablet TAKE 1 TABLET (50 MG TOTAL) BY MOUTH DAILY. 90 tablet 1  . ondansetron (ZOFRAN) 4 MG tablet Take 1 tablet (4 mg total) by mouth every 8 (eight) hours as needed for nausea or vomiting. (Patient taking differently: Take 2 mg by mouth every 8 (eight) hours as needed for nausea or vomiting. ) 20 tablet 0  . pantoprazole (PROTONIX) 40 MG tablet Take 1 tablet (40 mg total) by mouth daily. 90 tablet 3  . Tetrahydrozoline HCl (VISINE OP) Place 2-3 drops into both eyes 2 (two) times daily as needed (for dry eyes).    . TRINTELLIX 5 MG TABS Take 10 mg by mouth daily.   2  . oxyCODONE (OXY IR/ROXICODONE) 5 MG immediate release tablet Take 1 tablet (5 mg total) by mouth every 6 (six) hours as needed for severe pain.  (Patient not taking: Reported on 02/12/2018) 20 tablet 0    No results found for this or any previous visit (from the past 48 hour(s)). No results found.  Review of Systems  Constitutional: Negative.   HENT: Negative.   Eyes: Negative.   Respiratory: Negative.   Cardiovascular: Negative.   Gastrointestinal: Positive for heartburn. Negative for abdominal pain, blood in stool, constipation, diarrhea, melena, nausea and vomiting.  Genitourinary: Negative.   Musculoskeletal: Positive for joint pain.  Skin: Negative.   Neurological: Negative.   Endo/Heme/Allergies: Negative.   Psychiatric/Behavioral: Positive for depression. Negative for hallucinations, memory loss, substance abuse and suicidal ideas. The patient is nervous/anxious. The patient does not have insomnia.    Blood pressure 121/88, pulse 88, temperature 97.7 F (36.5 C), temperature source  Oral, resp. rate 18, height 5\' 7"  (1.702 m), weight 70.3 kg (155 lb), SpO2 96 %. Physical Exam  Constitutional: He is oriented to person, place, and time. He appears well-developed and well-nourished.  HENT:  Head: Normocephalic and atraumatic.  Eyes: Pupils are equal, round, and reactive to light. EOM are normal.  Neck: Normal range of motion. Neck supple.  Cardiovascular: Normal rate and regular rhythm.  Respiratory: Effort normal and breath sounds normal.  GI: Soft. Bowel sounds are normal.  Musculoskeletal: Normal range of motion.  Neurological: He is alert and oriented to person, place, and time.  Skin: Skin is warm and dry.  Psychiatric: He has a normal mood and affect. His behavior is normal. Judgment and thought content normal.    Assessment/Plan Colorectal cancer screening/dysphagia-proceed with an EGD/Colonoscopy at this time.  Yisroel Mullendore, MD 02/20/2018, 7:22 AM

## 2018-02-25 MED FILL — PRALUENT 150 MG/ML PEN: 150 | 28 days supply | Qty: 2 | Fill #3

## 2018-02-26 ENCOUNTER — Telehealth: Payer: Self-pay | Admitting: Internal Medicine

## 2018-02-26 DIAGNOSIS — J069 Acute upper respiratory infection, unspecified: Secondary | ICD-10-CM

## 2018-02-26 MED ORDER — AZITHROMYCIN 250 MG PO TABS: ORAL_TABLET | ORAL | 0 refills | Status: DC

## 2018-02-26 MED FILL — AMPHETAMINE SALTS 30 MG TAB: 30 | 90 days supply | Qty: 270 | Fill #0

## 2018-02-26 MED FILL — AZITHROMYCIN 250 MG TABLET: 250 | 5 days supply | Qty: 6 | Fill #0

## 2018-02-26 MED FILL — TRINTELLIX 5 MG TABLET: 5 | 30 days supply | Qty: 90 | Fill #1

## 2018-02-26 NOTE — Telephone Encounter (Signed)
Call in Zithromax Zpak 2 po day 1 followed by one po days 2-5. Pinch nostrils for 10 minutes with nosebleed.

## 2018-02-26 NOTE — Telephone Encounter (Signed)
C/O:  Nose bleed this morning.  Back of nose burning, greenish sinus drainage, scratchy throat.  He feels he has a low grade fever.  He is leaving the country for a trip on Saturday.  He wants to know if you will call in a Z-pak for him to the pharmacy?  [facility]  Phone #:  [phone]  Thank you.

## 2018-02-26 NOTE — Telephone Encounter (Signed)
E-scribed, pt was notified.

## 2018-03-24 ENCOUNTER — Other Ambulatory Visit: Payer: Self-pay | Admitting: Cardiovascular Disease

## 2018-03-24 ENCOUNTER — Other Ambulatory Visit: Payer: Self-pay | Admitting: Internal Medicine

## 2018-03-24 DIAGNOSIS — I10 Essential (primary) hypertension: Secondary | ICD-10-CM

## 2018-03-24 MED FILL — PANTOPRAZOLE SOD DR 40 MG T: 40 | 90 days supply | Qty: 90 | Fill #0

## 2018-03-24 MED FILL — PRALUENT 150 MG/ML PEN: 150 | 28 days supply | Qty: 2 | Fill #4

## 2018-03-24 MED FILL — LEVOTHYROXINE 75 MCG TABLET: 75 | 90 days supply | Qty: 90 | Fill #0

## 2018-03-24 MED FILL — LOSARTAN POTASSIUM 50 MG TA: 50 | 90 days supply | Qty: 90 | Fill #2

## 2018-03-24 MED FILL — HYDROCHLOROTHIAZIDE 12.5 MG: 12.5 | 90 days supply | Qty: 90 | Fill #0

## 2018-03-24 MED FILL — AMLODIPINE BESYLATE 10 MG T: 10 | 90 days supply | Qty: 90 | Fill #0

## 2018-03-24 NOTE — Telephone Encounter (Signed)
Rx request sent to pharmacy.  

## 2018-04-09 MED FILL — CLOPIDOGREL 75 MG TABLET: 75 | 90 days supply | Qty: 90 | Fill #3

## 2018-04-09 MED FILL — TRINTELLIX 5 MG TABLET: 5 | 30 days supply | Qty: 90 | Fill #2

## 2018-04-14 MED FILL — LORazepam 1 MG TABS: 1 | 90 days supply | Qty: 360 | Fill #0

## 2018-04-23 MED FILL — PRALUENT 150 MG/ML PEN: 150 | 28 days supply | Qty: 2 | Fill #5

## 2018-04-23 MED FILL — METOPROLOL SUCCINATE ER 50: 50 | 90 days supply | Qty: 90 | Fill #1

## 2018-04-30 ENCOUNTER — Telehealth: Payer: Self-pay | Admitting: Cardiovascular Disease

## 2018-04-30 NOTE — Telephone Encounter (Signed)
Spoke with patient of Dr. Gwenlyn Found who reports SOB for 2-3 months that has been getting progressively worse. He states he has SOB at rest and with minimal exertion. Patient denies edema. He has gained 4lbs in the last month. He reports occasional lightheadedness. He states his meds have not changed. Last echo 2 years ago showed normal LVEF, no diagnosis of HF on file. His resting HR is 80s-90s. He did not sound audibly during our conversation.  He has MD OV 05/06/18  Will routed to MD to see if he needs any med changes, labs, etc prior to visit

## 2018-04-30 NOTE — Telephone Encounter (Signed)
New Message    Pt c/o Shortness Of Breath: STAT if SOB developed within the last 24 hours or pt is noticeably SOB on the phone  1. Are you currently SOB (can you hear that pt is SOB on the phone)? Yes, the patient sounds sob on the phone as well   2. How long have you been experiencing SOB? On and off for about two    3. Are you SOB when sitting or when up moving around? Sitting or moving   4. Are you currently experiencing any other symptoms? No    Patient indicates that the sob has been going on for about two weeks and its progressing daily.

## 2018-04-30 NOTE — Telephone Encounter (Signed)
Spoke with pt, Follow up scheduled  

## 2018-04-30 NOTE — Telephone Encounter (Signed)
Please make an appointment for the patient to see me in the office on Friday.

## 2018-05-02 ENCOUNTER — Encounter: Payer: Self-pay | Admitting: Cardiovascular Disease

## 2018-05-02 ENCOUNTER — Ambulatory Visit: Payer: 59 | Admitting: Cardiovascular Disease

## 2018-05-02 VITALS — BP 139/81 | HR 84 | Ht 67.0 in | Wt 161.0 lb

## 2018-05-02 DIAGNOSIS — I2510 Atherosclerotic heart disease of native coronary artery without angina pectoris: Secondary | ICD-10-CM | POA: Diagnosis not present

## 2018-05-02 DIAGNOSIS — I10 Essential (primary) hypertension: Secondary | ICD-10-CM | POA: Diagnosis not present

## 2018-05-02 DIAGNOSIS — I6521 Occlusion and stenosis of right carotid artery: Secondary | ICD-10-CM | POA: Diagnosis not present

## 2018-05-02 DIAGNOSIS — E7800 Pure hypercholesterolemia, unspecified: Secondary | ICD-10-CM | POA: Diagnosis not present

## 2018-05-02 NOTE — Progress Notes (Signed)
05/02/2018 [name]   1951/04/29  [id number]  Primary Physician Baxley, Cresenciano Lick, MD Primary Cardiologist: [name] MD [name], [place]  HPI:  [name] is a 67 y.o.  thin-appearing, married Caucasian male, father of 43, grandfather to 4 grandchildren who I last saw  07/19/2017. He has a history of coronary artery disease status post bypass grafting x5 by Dr. Merilynn Finland June 16, 2008, with a free LIMA to his LAD, sequential vein to the first diagonal branch, first and second obtuse marginal branches, as well as a vein to the PDA. His other problems include hypertension and hyperlipidemia. He denies chest pain or shortness of breath. I did perform cerebral angiography on him May 05, 2008, because of a question of a high-grade internal carotid artery stenosis read by the radiologist. However, this turned out to be a left external carotid artery stenosis. He does have high-grade ostial left internal mammary artery stenosis which necessitated the use of a free LIMA. He is statin intolerant. 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 A Myoview stress test performed December 11, 2011, was normal and carotid Dopplers did show a moderately severe right ICA stenosis which we have been following by duplex ultrasound. 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 Since I saw him 12 months ago he has noticed increasing dyspnea on exertion over the last several months, especially noticeable while mowing his lawn. I obtained a Myoview stress test on him based on this 03/29/16 that showed a large reversible lateral wall perfusion defect consistent with ischemia.He underwent outpatient cardiac catheterization by myself on 05/14/16 revealing severe native and  graft vessel disease with normal LV function. Specifically, his RCA vein graft was occluded to an occluded dominant right and his circumflex vein graft which was sequential to 2 obtuse marginal branches was occluded at the aorta to patent continuation between OM1 and 2. He did have native circumflex disease. Dr. Roxan Hockey saw him during his brief hospitalization to discuss the possibility of redo coronary artery bypass grafting although the consensus was that he would be better served with attempted PCI and stenting of his native circumflex coronary artery. I perform this electively on 06/14/16 and implanted a 2.25 mm x 38 mm long synergy drug-eluting stent in his AV groove circumflex into his obtuse marginal branch. He had excellent angiographic and clinical result. He was discharged on the following day. His dyspnea ultimately resolved. Since I saw him in the office 7 months ago he's remained clinically stable. He does have a right shoulder orthopedic issue but apparently he is not a surgical candidate. Since I saw him a year ago he remained stable.  He denies chest pain.  He does complain of some breathing abnormality which does not does not necessarily sound like shortness of breath.  He had an excellent lipid profile performed by his PCP on 11/19/2017.      Current Meds  Medication Sig  . Alirocumab (PRALUENT) 150 MG/ML SOPN Inject 150 mg into the skin every 14 (fourteen) days.  Marland Kitchen amLODipine (NORVASC) 10 MG tablet TAKE 1 TABLET BY MOUTH DAILY  . amphetamine-dextroamphetamine (ADDERALL) 30 MG tablet Take 30 mg by mouth 3 (three) times daily.  Marland Kitchen aspirin 325 MG tablet Take 325 mg  by mouth as needed.  Marland Kitchen azithromycin (ZITHROMAX) 250 MG tablet Take 2 tablets on day one and take one tablet po on days 2-5  . clopidogrel (PLAVIX) 75 MG tablet TAKE 1 TABLET (75 MG TOTAL) BY MOUTH DAILY.  . hydrochlorothiazide (MICROZIDE) 12.5 MG capsule TAKE 1 CAPSULE BY MOUTH DAILY  . levothyroxine (SYNTHROID,  LEVOTHROID) 75 MCG tablet TAKE 1 TABLET BY MOUTH DAILY  . loratadine (CLARITIN) 10 MG tablet Take 10 mg by mouth 2 (two) times daily as needed for allergies.   Marland Kitchen LORazepam (ATIVAN) 1 MG tablet Take 1 tablet (1 mg total) by mouth every 8 (eight) hours as needed for anxiety. (Patient taking differently: Take 1 mg by mouth 4 (four) times daily as needed for anxiety. )  . losartan (COZAAR) 50 MG tablet TAKE 1 TABLET BY MOUTH DAILY  . metoprolol succinate (TOPROL-XL) 50 MG 24 hr tablet TAKE 1 TABLET (50 MG TOTAL) BY MOUTH DAILY.  . pantoprazole (PROTONIX) 40 MG tablet TAKE 1 TABLET (40 MG TOTAL) BY MOUTH DAILY.  Marland Kitchen Tetrahydrozoline HCl (VISINE OP) Place 2-3 drops into both eyes 2 (two) times daily as needed (for dry eyes).  . TRINTELLIX 5 MG TABS Take 10 mg by mouth daily.   . [DISCONTINUED] aspirin 81 MG tablet Take 81 mg by mouth daily.  . [DISCONTINUED] ondansetron (ZOFRAN) 4 MG tablet Take 1 tablet (4 mg total) by mouth every 8 (eight) hours as needed for nausea or vomiting. (Patient taking differently: Take 2 mg by mouth every 8 (eight) hours as needed for nausea or vomiting. )  . [DISCONTINUED] oxyCODONE (OXY IR/ROXICODONE) 5 MG immediate release tablet Take 1 tablet (5 mg total) by mouth every 6 (six) hours as needed for severe pain.     Allergies  Allergen Reactions  . Crestor [Rosuvastatin Calcium] Other (See Comments)    Difficulty swallowing  . Tramadol Shortness Of Breath  . Lisinopril Cough  . Tricor [Fenofibrate] Other (See Comments)    UNSPECIFIED REACTION  . Lipitor [Atorvastatin Calcium] Other (See Comments)    Muscle spasms    Social History   Socioeconomic History  . Marital status: Married    Spouse name: Not on file  . Number of children: Not on file  . Years of education: Not on file  . Highest education level: Not on file  Occupational History  . Not on file  Social Needs  . Financial resource strain: Not on file  . Food insecurity:    Worry: Not on file     Inability: Not on file  . Transportation needs:    Medical: Not on file    Non-medical: Not on file  Tobacco Use  . Smoking status: Former Smoker    Packs/day: 3.00    Years: 30.00    Pack years: 90.00    Types: Cigarettes    Last attempt to quit: 01/29/2001    Years since quitting: 17.2  . Smokeless tobacco: Never Used  Substance and Sexual Activity  . Alcohol use: Yes    Alcohol/week: 21.0 oz    Types: 35 Cans of beer per week    Comment: 4-5 day beers  . Drug use: No  . Sexual activity: Not on file  Lifestyle  . Physical activity:    Days per week: Not on file    Minutes per session: Not on file  . Stress: Not on file  Relationships  . Social connections:    Talks on phone: Not on file  Gets together: Not on file    Attends religious service: Not on file    Active member of club or organization: Not on file    Attends meetings of clubs or organizations: Not on file    Relationship status: Not on file  . Intimate partner violence:    Fear of current or ex partner: Not on file    Emotionally abused: Not on file    Physically abused: Not on file    Forced sexual activity: Not on file  Other Topics Concern  . Not on file  Social History Narrative  . Not on file     Review of Systems: General: negative for chills, fever, night sweats or weight changes.  Cardiovascular: negative for chest pain, dyspnea on exertion, edema, orthopnea, palpitations, paroxysmal nocturnal dyspnea or shortness of breath Dermatological: negative for rash Respiratory: negative for cough or wheezing Urologic: negative for hematuria Abdominal: negative for nausea, vomiting, diarrhea, bright red blood per rectum, melena, or hematemesis Neurologic: negative for visual changes, syncope, or dizziness All other systems reviewed and are otherwise negative except as noted above.    Blood pressure 139/81, pulse 84, height 5\' 7"  (1.702 m), weight 161 lb (73 kg).  General appearance: alert and no  distress Neck: no adenopathy, no JVD, supple, symmetrical, trachea midline, thyroid not enlarged, symmetric, no tenderness/mass/nodules and Soft right carotid bruit Lungs: clear to auscultation bilaterally Heart: regular rate and rhythm, S1, S2 normal, no murmur, click, rub or gallop Extremities: extremities normal, atraumatic, no cyanosis or edema Pulses: 2+ and symmetric Skin: Skin color, texture, turgor normal. No rashes or lesions Neurologic: Alert and oriented X 3, normal strength and tone. Normal symmetric reflexes. Normal coordination and gait  EKG sinus rhythm 84 with right bundle branch block and occasional PVCs.  I personally reviewed this EKG.  ASSESSMENT AND PLAN:   Coronary artery disease History of CAD status post coronary artery bypass grafting x5 by Dr. Roxan Hockey 06/16/2008.  He had a free LIMA to his LAD because of a ostial internal mammary artery stenosis, sequential vein to the first diagonal branch, first and second obtuse marginal branches and to the PDA.  He underwent repeat catheterization 05/14/2016 revealing an occluded vein to the RCA with left-to-right collaterals, occluded vein to the circumflex obtuse marginal branch patent sequential LIMA to the diagonal LAD.  He underwent PCI and drug-eluting stenting to his AV groove circumflex and marginal branch by Dr. and an excellent angiographic result.  He felt well afterwards and remains on dual antiplatelet therapy.  He denies chest pain.  Hypertension History of essential hypertension her blood pressure measured at 139/81.  He is on amlodipine, hydrochlorothiazide, losartan and metoprolol.  Internal carotid artery stenosis History of carotid artery disease with Dopplers performed 06/04/2017 revealing moderately severe to severe right and moderate left ICA stenosis.  He does have a high-grade right subclavian artery stenosis 30 mm pressure differential across his upper extremities.  He apparently has a hostile neck and if  interventions required he may need carotid artery stenting.  Hyperlipidemia History of hyperlipidemia on statin therapy with lipid profile performed 11/19/2017 revealing total cholesterol 181, LDL 62 and HDL of 85 followed by Dr. Renold Genta.      [name] MD FACP,FACC,FAHA, [facility] 05/02/2018 12:13 PM

## 2018-05-02 NOTE — Assessment & Plan Note (Signed)
History of essential hypertension her blood pressure measured at 139/81.  He is on amlodipine, hydrochlorothiazide, losartan and metoprolol.

## 2018-05-02 NOTE — Assessment & Plan Note (Signed)
History of hyperlipidemia on statin therapy with lipid profile performed 11/19/2017 revealing total cholesterol 181, LDL 62 and HDL of 85 followed by Dr. Renold Genta.

## 2018-05-02 NOTE — Patient Instructions (Signed)
Medication Instructions:   NO CHANGE  Testing/Procedures:  Your physician has requested that you have a carotid duplex. This test is an ultrasound of the carotid arteries in your neck. It looks at blood flow through these arteries that supply the brain with blood. Allow one hour for this exam. There are no restrictions or special instructions.    Follow-Up:  Your physician wants you to follow-up in: [place] will receive a reminder letter in the mail two months in advance. If you don't receive a letter, please call our office to schedule the follow-up appointment.   If you need a refill on your cardiac medications before your next appointment, please call your pharmacy.

## 2018-05-02 NOTE — Progress Notes (Signed)
VAS 

## 2018-05-02 NOTE — Assessment & Plan Note (Signed)
History of CAD status post coronary artery bypass grafting x5 by Dr. Roxan Hockey 06/16/2008.  He had a free LIMA to his LAD because of a ostial internal mammary artery stenosis, sequential vein to the first diagonal branch, first and second obtuse marginal branches and to the PDA.  He underwent repeat catheterization 05/14/2016 revealing an occluded vein to the RCA with left-to-right collaterals, occluded vein to the circumflex obtuse marginal branch patent sequential LIMA to the diagonal LAD.  He underwent PCI and drug-eluting stenting to his AV groove circumflex and marginal branch by Dr. and an excellent angiographic result.  He felt well afterwards and remains on dual antiplatelet therapy.  He denies chest pain.

## 2018-05-02 NOTE — Assessment & Plan Note (Signed)
History of carotid artery disease with Dopplers performed 06/04/2017 revealing moderately severe to severe right and moderate left ICA stenosis.  He does have a high-grade right subclavian artery stenosis 30 mm pressure differential across his upper extremities.  He apparently has a hostile neck and if interventions required he may need carotid artery stenting.

## 2018-05-03 IMAGING — CR DG CHEST 2V
2 series · 2 of 2 positions shown · non-contrast
Comparison: 12/14/2014

CLINICAL DATA: Abnormal stress test, blood clotting disorder,
preoperative evaluation, history coronary artery disease, subclavian
steal, COPD, hypertension, hyperlipidemia, former smoker

EXAM:
CHEST  2 VIEW

[w chest pa]
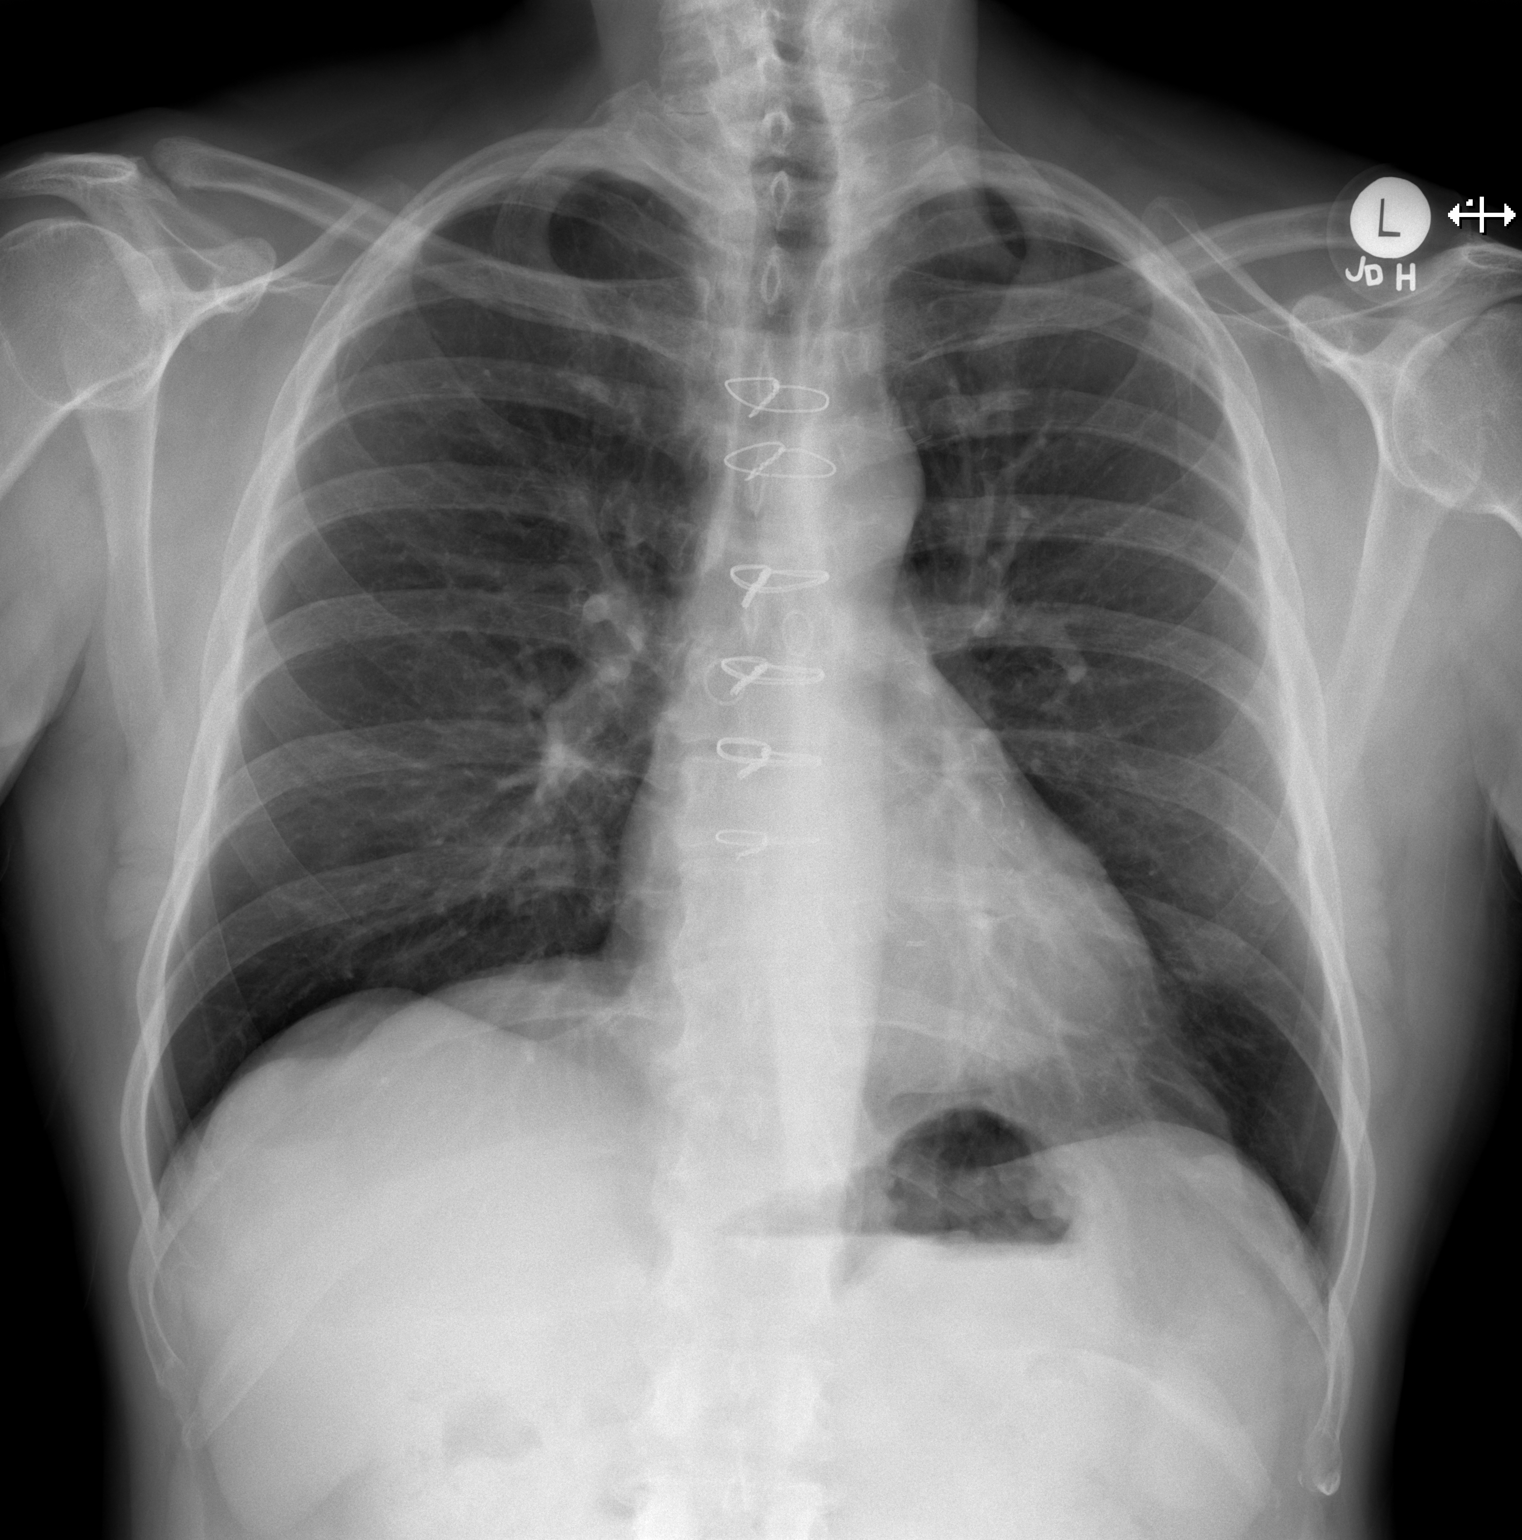

[w chest lat]
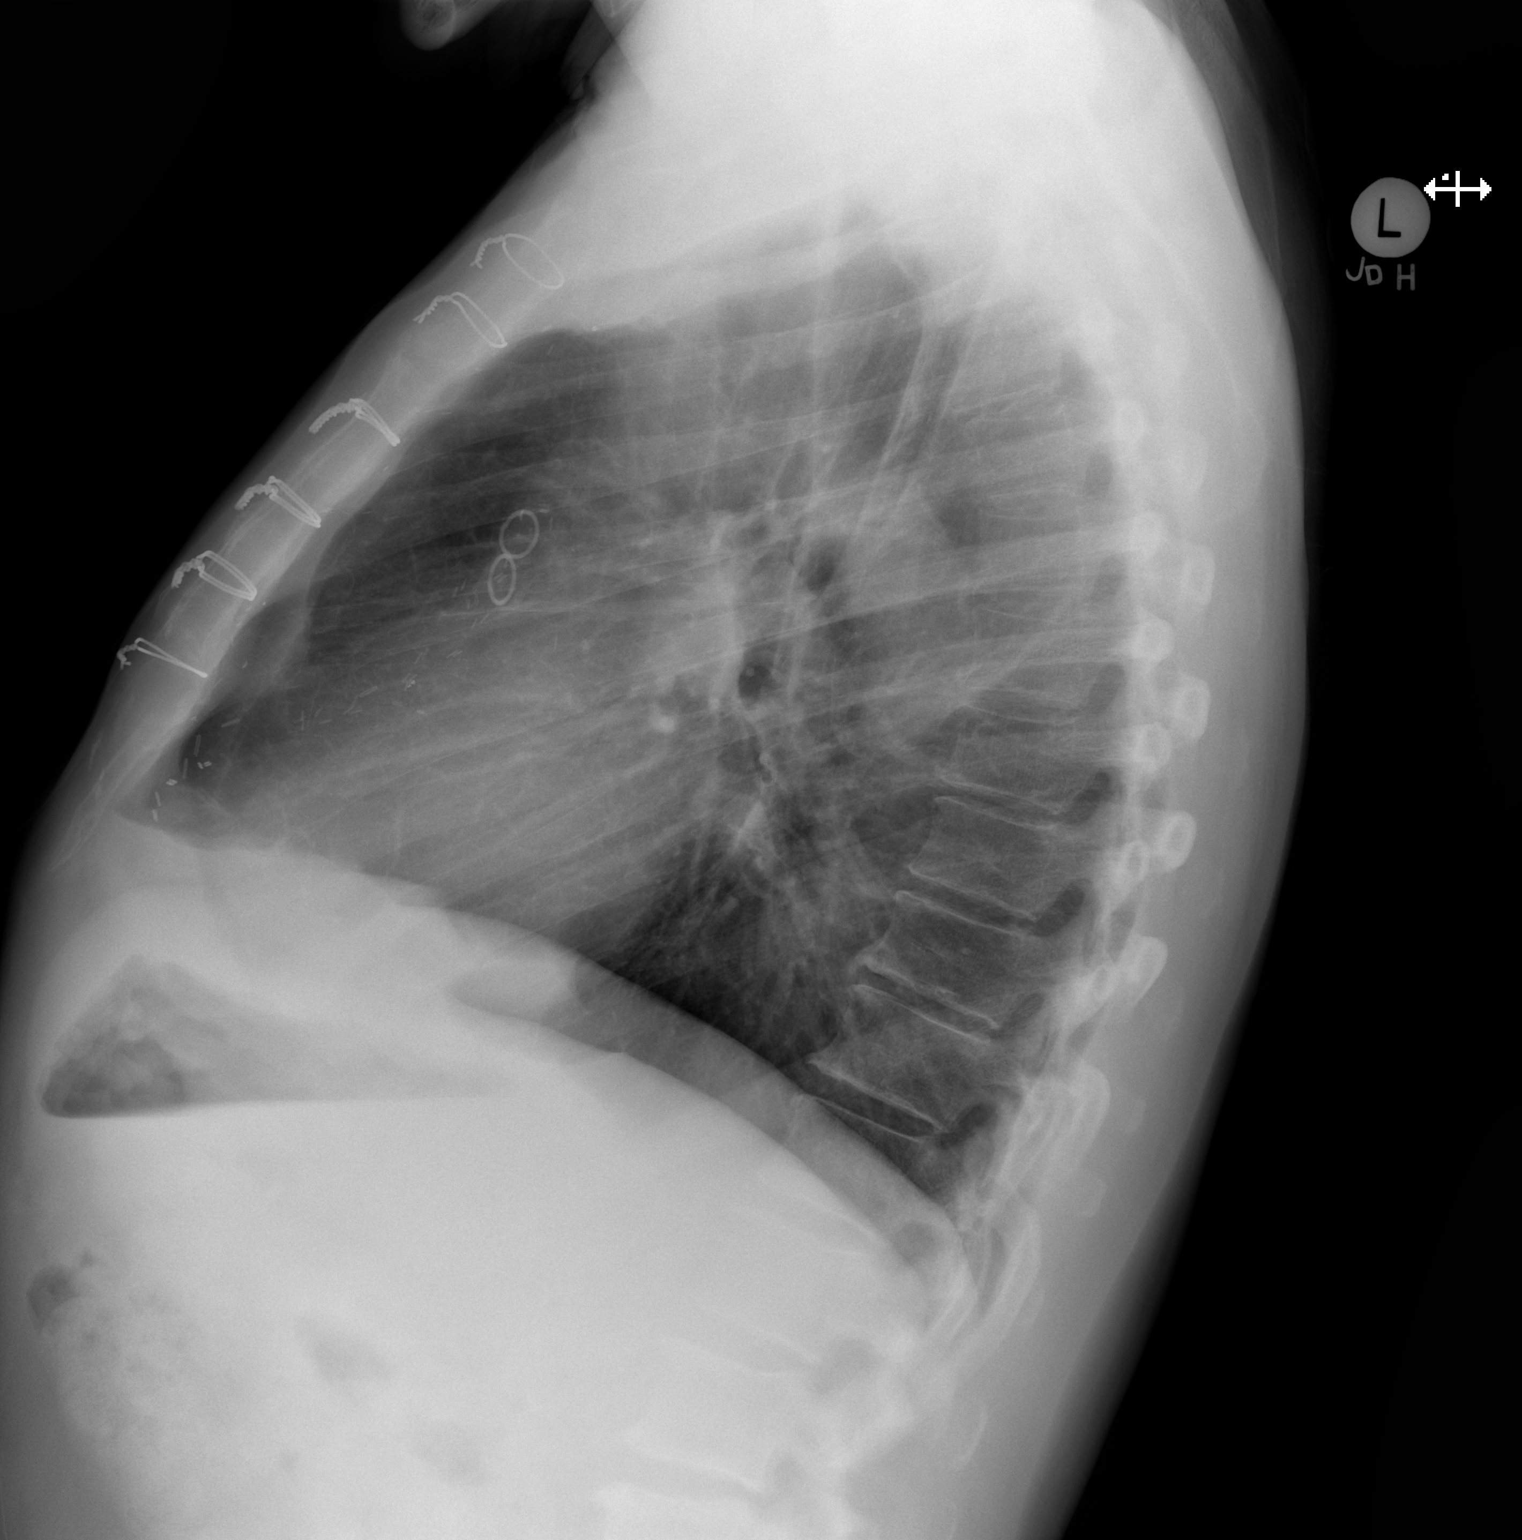

[2 of 2 positions shown; findings below may reference images not displayed]

FINDINGS: Normal heart size post CABG.

Mediastinal contours and pulmonary vascularity normal.

Lungs clear.

No pleural effusion or pneumothorax.

Bones unremarkable.
IMPRESSION: Post CABG.

No acute abnormalities.

## 2018-05-06 ENCOUNTER — Ambulatory Visit: Payer: 59 | Admitting: Cardiovascular Disease

## 2018-05-07 ENCOUNTER — Ambulatory Visit (HOSPITAL_COMMUNITY)
Admission: RE | Admit: 2018-05-07 | Discharge: 2018-05-07 | Disposition: A | Discharge status: Home / Self Care | Payer: 59 | Source: Ambulatory Visit | Attending: Internal Medicine | Admitting: Internal Medicine

## 2018-05-07 DIAGNOSIS — I6521 Occlusion and stenosis of right carotid artery: Secondary | ICD-10-CM | POA: Diagnosis not present

## 2018-05-12 ENCOUNTER — Other Ambulatory Visit: Payer: Self-pay | Admitting: *Deleted

## 2018-05-12 DIAGNOSIS — I6521 Occlusion and stenosis of right carotid artery: Secondary | ICD-10-CM

## 2018-05-15 MED FILL — TRINTELLIX 5 MG TABLET: 5 | 30 days supply | Qty: 90 | Fill #3

## 2018-05-20 MED FILL — PRALUENT 150 MG/ML PEN: 150 | 28 days supply | Qty: 2 | Fill #0

## 2018-05-27 ENCOUNTER — Other Ambulatory Visit: Payer: Self-pay | Admitting: Pharmacist Clinician (PhC)/ Clinical Pharmacy Specialist

## 2018-05-27 DIAGNOSIS — E7800 Pure hypercholesterolemia, unspecified: Secondary | ICD-10-CM

## 2018-05-27 DIAGNOSIS — H524 Presbyopia: Secondary | ICD-10-CM | POA: Diagnosis not present

## 2018-05-27 DIAGNOSIS — H5203 Hypermetropia, bilateral: Secondary | ICD-10-CM | POA: Diagnosis not present

## 2018-05-27 DIAGNOSIS — H52223 Regular astigmatism, bilateral: Secondary | ICD-10-CM | POA: Diagnosis not present

## 2018-06-04 ENCOUNTER — Inpatient Hospital Stay (HOSPITAL_COMMUNITY): Admission: RE | Admit: 2018-06-04 | Payer: 59 | Source: Ambulatory Visit

## 2018-06-06 ENCOUNTER — Ambulatory Visit (INDEPENDENT_AMBULATORY_CARE_PROVIDER_SITE_OTHER): Payer: 59 | Admitting: Internal Medicine

## 2018-06-06 ENCOUNTER — Encounter: Payer: Self-pay | Admitting: Internal Medicine

## 2018-06-06 VITALS — BP 120/72 | HR 77 | Temp 98.3°F | Ht 67.0 in | Wt 161.0 lb

## 2018-06-06 DIAGNOSIS — J22 Unspecified acute lower respiratory infection: Secondary | ICD-10-CM

## 2018-06-06 MED ORDER — HYDROCOD POLST-CPM POLST ER 10-8 MG/5ML PO SUER: 5.0000 mL | Freq: Two times a day (BID) | ORAL | 0 refills | Status: DC | PRN

## 2018-06-06 MED FILL — AZITHROMYCIN 250 MG TABLET: 250 | 5 days supply | Qty: 6 | Fill #0

## 2018-06-06 MED FILL — HYDROCODONE-CHLORPHENIRAM S: 10-8 | 14 days supply | Qty: 140 | Fill #0

## 2018-06-06 NOTE — Patient Instructions (Signed)
Zithromax Z-PAK take 2 p.o. day 1 followed by 1 p.o. days 2 through 5.  Tussionex 1 teaspoon p.o. every 12 hours as needed cough.

## 2018-06-06 NOTE — Progress Notes (Signed)
   Subjective:    Patient ID: [name], male    DOB: 09/24/1951, 67 y.o.   MRN: [id number]  HPI 67 year old Male with URI symptoms for a few days. Is leaving to go out of town on vacation Tuesday. Has hoarseness, malaise and fatigue with discolored nasal drainage and now being to settle in his chest. No documented fever. No shaking chills.  Hx CAD,HTN, Hypothyroidism,Carotid disease,hx stroke,cervical disc disease    Review of Systems see above     Objective:   Physical Exam Pharynx slightly injected. TMs clear. Hoarse with speaking. Neck supple. Chest clear.       Assessment & Plan:  Acute lower respiratory infection  Plan: Zithromax Z pak- 2 po day 1 followed by one po days 2-5.  Tussionex suspension 1 teaspoon p.o. every 12 hours as needed cough.  Rest and drink plenty of fluids.

## 2018-06-10 DIAGNOSIS — M654 Radial styloid tenosynovitis [de Quervain]: Secondary | ICD-10-CM | POA: Diagnosis not present

## 2018-06-10 DIAGNOSIS — M25511 Pain in right shoulder: Secondary | ICD-10-CM | POA: Diagnosis not present

## 2018-06-10 MED FILL — DEXTROAMPH TB 30MG NSTR 100: 30 | 90 days supply | Qty: 270 | Fill #0

## 2018-06-19 MED FILL — PRALUENT 150 MG/ML PEN: 150 | 28 days supply | Qty: 2 | Fill #1

## 2018-06-20 ENCOUNTER — Ambulatory Visit (INDEPENDENT_AMBULATORY_CARE_PROVIDER_SITE_OTHER): Payer: 59 | Admitting: Internal Medicine

## 2018-06-20 VITALS — BP 160/88 | HR 80 | Temp 98.1°F | Ht 67.0 in | Wt 158.0 lb

## 2018-06-20 DIAGNOSIS — R05 Cough: Secondary | ICD-10-CM | POA: Diagnosis not present

## 2018-06-20 DIAGNOSIS — N529 Male erectile dysfunction, unspecified: Secondary | ICD-10-CM | POA: Diagnosis not present

## 2018-06-20 DIAGNOSIS — H6503 Acute serous otitis media, bilateral: Secondary | ICD-10-CM | POA: Diagnosis not present

## 2018-06-20 DIAGNOSIS — J22 Unspecified acute lower respiratory infection: Secondary | ICD-10-CM

## 2018-06-20 DIAGNOSIS — R059 Cough, unspecified: Secondary | ICD-10-CM

## 2018-06-20 MED ORDER — METHYLPREDNISOLONE ACETATE 80 MG/ML IJ SUSP
80.0000 mg | Freq: Once | INTRAMUSCULAR | Status: AC
  Administered 2018-06-20: 80 mg via INTRAMUSCULAR

## 2018-06-20 MED ORDER — HYDROCOD POLST-CPM POLST ER 10-8 MG/5ML PO SUER: 5.0000 mL | Freq: Two times a day (BID) | ORAL | 0 refills | Status: DC | PRN

## 2018-06-20 MED ORDER — DOXYCYCLINE HYCLATE 100 MG PO TABS: 100.0000 mg | ORAL_TABLET | Freq: Two times a day (BID) | ORAL | 0 refills | Status: DC

## 2018-06-20 MED ORDER — TADALAFIL 20 MG PO TABS: 20.0000 mg | ORAL_TABLET | Freq: Every day | ORAL | 0 refills | Status: DC | PRN

## 2018-06-20 MED ORDER — CEFTRIAXONE SODIUM 1 G IJ SOLR
1.0000 g | Freq: Once | INTRAMUSCULAR | Status: AC
  Administered 2018-06-20: 1 g via INTRAMUSCULAR

## 2018-06-20 MED FILL — HYDROCODONE-CHLORPHENIRAM S: 10-8 | 14 days supply | Qty: 140 | Fill #0

## 2018-06-20 MED FILL — DOXYCYCLINE HYCLATE 100 MG: 100 | 10 days supply | Qty: 20 | Fill #0

## 2018-06-20 NOTE — Patient Instructions (Signed)
Cialis 20 mg prescribed to take one hour before intercourse. Doxycycline 100 mg twice a day x 10 days Rocephin one gram IM. Depomedrol 80 mg IM

## 2018-06-20 NOTE — Progress Notes (Signed)
   Subjective:    Patient ID: [name], male    DOB: 1951-03-08, 67 y.o.   MRN: [id number]  HPI 67 year old Male says he never got better from respiratory infection which was treated September 6 with a Zithromax Z-Pak and Tussionex.  He says he still coughing.  Says he has discolored sputum.  No documented fever or chills.  Just cough and malaise.  Says ears feel full like he has been swimming.  He brings up the issue of erectile dysfunction today.  Would like prescription for Cialis.  Indicated we would likely have to have prior authorization for that.  He would like 20 mg tablets.  History of hypertension, coronary artery disease, hyperlipidemia, right subclavian artery stenosis.  History of left thalamic stroke June 2005.  History of cervical disc disease.   Review of Systems  Constitutional: Positive for fatigue.  HENT: Positive for congestion.   Respiratory: Positive for cough. Negative for wheezing.   Cardiovascular: Negative for chest pain and palpitations.       Objective:   Physical Exam  Constitutional: He is oriented to person, place, and time.  Pharynx is red without exudate  HENT:  Both TMs are full but the left is smaller than the right.  They are not red.  Neck: Neck supple.  No significant adenopathy  Cardiovascular: Normal rate, regular rhythm and normal heart sounds.  Pulmonary/Chest: Effort normal and breath sounds normal. No respiratory distress. He has no wheezes. He has no rales.  Musculoskeletal: He exhibits no edema.  Neurological: He is alert and oriented to person, place, and time.  Skin: Skin is warm and dry. He is not diaphoretic.  Vitals reviewed.         Assessment & Plan:  Acute lower respiratory infection-not improved  Erectile dysfunction  Plan: Depo-Medrol 80 mg IM.  Rocephin 1 g IM.  Doxycycline 100 mg twice daily for 10 days.  Prescription for Cialis 20 mg to take 1 hour before intercourse.

## 2018-06-24 ENCOUNTER — Other Ambulatory Visit: Payer: Self-pay | Admitting: Internal Medicine

## 2018-06-24 MED FILL — PANTOPRAZOLE SOD DR 40 MG T: 40 | 90 days supply | Qty: 90 | Fill #1

## 2018-06-24 MED FILL — LOSARTAN POTASSIUM 50 MG TA: 50 | 90 days supply | Qty: 90 | Fill #3

## 2018-06-24 MED FILL — AMLODIPINE BESYLATE 10 MG T: 10 | 90 days supply | Qty: 90 | Fill #1

## 2018-06-24 MED FILL — LEVOTHYROXINE 75 MCG TABLET: 75 | 90 days supply | Qty: 90 | Fill #0

## 2018-07-03 DIAGNOSIS — E7800 Pure hypercholesterolemia, unspecified: Secondary | ICD-10-CM | POA: Diagnosis not present

## 2018-07-04 LAB — LIPID PANEL
CHOL/HDL RATIO: 1.8 ratio (ref 0.0–5.0)
Cholesterol, Total: 164 mg/dL (ref 100–199)
HDL: 93 mg/dL (ref >39)
LDL Calculated: 57 mg/dL (ref 0–99)
Triglycerides: 69 mg/dL (ref 0–149)
VLDL Cholesterol Cal: 14 mg/dL (ref 5–40)

## 2018-07-11 MED FILL — PRALUENT 150 MG/ML PEN: 150 | 28 days supply | Qty: 2 | Fill #2

## 2018-07-16 ENCOUNTER — Other Ambulatory Visit: Payer: Self-pay | Admitting: Cardiovascular Disease

## 2018-07-16 MED FILL — CLOPIDOGREL 75 MG TABLET: 75 | 90 days supply | Qty: 90 | Fill #0

## 2018-07-16 MED FILL — HYDROCHLOROTHIAZIDE 12.5 MG: 12.5 | 90 days supply | Qty: 90 | Fill #1

## 2018-07-16 NOTE — Telephone Encounter (Signed)
Rx request sent to pharmacy.  

## 2018-07-17 MED FILL — TRINTELLIX 5 MG TABLET: 5 | 30 days supply | Qty: 90 | Fill #0

## 2018-08-01 ENCOUNTER — Other Ambulatory Visit: Payer: Self-pay | Admitting: Cardiovascular Disease

## 2018-08-01 MED FILL — METOPROLOL SUCCINATE ER 50: 50 | 90 days supply | Qty: 90 | Fill #0

## 2018-08-01 MED FILL — TADALAFIL 20 MG TABS: 20 | 20 days supply | Qty: 4 | Fill #1

## 2018-08-01 NOTE — Telephone Encounter (Signed)
Rx request sent to pharmacy.  

## 2018-08-07 DIAGNOSIS — Z23 Encounter for immunization: Secondary | ICD-10-CM | POA: Diagnosis not present

## 2018-08-07 MED FILL — PRALUENT 150 MG/ML PEN: 150 | 28 days supply | Qty: 2 | Fill #3

## 2018-08-19 MED FILL — LORazepam 1 MG TABS: 1 | 90 days supply | Qty: 360 | Fill #1

## 2018-09-02 ENCOUNTER — Other Ambulatory Visit: Payer: Self-pay | Admitting: Internal Medicine

## 2018-09-02 MED FILL — TADALAFIL 20 MG TABS: 20 | 30 days supply | Qty: 6 | Fill #0

## 2018-09-02 MED FILL — TRINTELLIX 5 MG TABLET: 5 | 30 days supply | Qty: 90 | Fill #1

## 2018-09-02 MED FILL — PRALUENT 150 MG/ML PEN: 150 | 28 days supply | Qty: 2 | Fill #4

## 2018-09-03 MED FILL — DEXTROAMPH TB 30MG NSTR 100: 30 | 90 days supply | Qty: 270 | Fill #0

## 2018-10-07 ENCOUNTER — Other Ambulatory Visit: Payer: Self-pay | Admitting: Internal Medicine

## 2018-10-07 ENCOUNTER — Other Ambulatory Visit: Payer: Self-pay | Admitting: Cardiovascular Disease

## 2018-10-07 MED FILL — AMLODIPINE BESYLATE 10 MG T: 10 | 90 days supply | Qty: 90 | Fill #2

## 2018-10-07 MED FILL — PRALUENT 150 MG/ML PEN: 150 | 28 days supply | Qty: 2 | Fill #5

## 2018-10-07 MED FILL — TADALAFIL 20 MG TABS: 20 | 30 days supply | Qty: 6 | Fill #1

## 2018-10-07 MED FILL — HYDROCHLOROTHIAZIDE 12.5 MG: 12.5 | 90 days supply | Qty: 90 | Fill #2

## 2018-10-07 MED FILL — CLOPIDOGREL 75 MG TABLET: 75 | 90 days supply | Qty: 90 | Fill #1

## 2018-10-07 MED FILL — PANTOPRAZOLE SOD DR 40 MG T: 40 | 90 days supply | Qty: 90 | Fill #2

## 2018-10-08 ENCOUNTER — Other Ambulatory Visit: Payer: Self-pay | Admitting: Internal Medicine

## 2018-10-08 NOTE — Telephone Encounter (Signed)
Rx(s) sent to pharmacy electronically.  

## 2018-10-09 NOTE — Telephone Encounter (Signed)
Left detailed message.  Pt to call back to schedule appt.

## 2018-10-09 NOTE — Telephone Encounter (Signed)
We denied it previously as he has not had a recent TSH. Please contact him re appt and TSH

## 2018-10-14 ENCOUNTER — Telehealth: Payer: Self-pay

## 2018-10-14 NOTE — Telephone Encounter (Signed)
Called pt to see if there had been an insurance change to process pa for praluent

## 2018-10-15 DIAGNOSIS — M25511 Pain in right shoulder: Secondary | ICD-10-CM | POA: Diagnosis not present

## 2018-10-15 MED FILL — LOSARTAN POTASSIUM 50 MG TA: 50 | 90 days supply | Qty: 90 | Fill #0

## 2018-10-15 MED FILL — TRINTELLIX 5 MG TABLET: 5 | 30 days supply | Qty: 90 | Fill #2

## 2018-10-17 ENCOUNTER — Ambulatory Visit (INDEPENDENT_AMBULATORY_CARE_PROVIDER_SITE_OTHER): Payer: 59 | Admitting: Internal Medicine

## 2018-10-17 ENCOUNTER — Encounter: Payer: Self-pay | Admitting: Internal Medicine

## 2018-10-17 VITALS — BP 150/80 | Temp 98.2°F | Ht 67.0 in | Wt 158.0 lb

## 2018-10-17 DIAGNOSIS — Z1159 Encounter for screening for other viral diseases: Secondary | ICD-10-CM

## 2018-10-17 DIAGNOSIS — E039 Hypothyroidism, unspecified: Secondary | ICD-10-CM

## 2018-10-17 MED ORDER — LEVOTHYROXINE SODIUM 75 MCG PO TABS: 75.0000 ug | ORAL_TABLET | Freq: Every day | ORAL | 0 refills | Status: DC

## 2018-10-18 LAB — HEPATITIS C ANTIBODY
Hepatitis C Ab: NONREACTIVE
SIGNAL TO CUT-OFF: 0.13 (ref <1.00)

## 2018-10-18 LAB — TSH: TSH: 3.48 mIU/L (ref 0.40–4.50)

## 2018-10-25 NOTE — Patient Instructions (Signed)
TSH is normal.  Continue same dose of thyroid replacement.  Physical exam scheduled for March.  Hepatitis C antibody is negative.

## 2018-10-25 NOTE — Progress Notes (Signed)
   Subjective:    Patient ID: [name], male    DOB: Apr 10, 1951, 68 y.o.   MRN: [id number]  HPI He is getting ready to go out of town with wife to [place].  Mariel Kansky family member may not be able to get there from [place] due to bad weather.  He is here to follow-up on hypothyroidism.  Has not had recent TSH since April 2018.  That was when he last had a health maintenance exam as well.  I wanted TSH checked.  He had colonoscopy in May 2019.  This was with upper endoscopy as well.  This was done by Dr. Collene Mares.  He was having some issues swallowing.  He had normal stomach and normal duodenum.  Esophageal mucosal changes were suggestive of eosinophilic esophagitis however biopsies showed only esophagitis and not eosinophilic esophagitis.  Colonoscopy revealed hyperplastic polyp.  Lipids not checked since October 2019 at which time they were normal.  He requests Hepatitis C testing today in addition to TSH.  Review of Systems see above     Objective:   Physical Exam Blood pressure is elevated at 150/80.  Reminded him to watch it at home and call if persistently elevated. No adenopathy.  No thyromegaly.  Chest clear to auscultation.  Cardiac exam regular rate and rhythm      Assessment & Plan:  Hypothyroidism-TSH is normal and may continue with same dose of thyroid replacement  Plan: Physical examination booked for March 2020.

## 2018-11-11 MED FILL — PRALUENT 150 MG/ML PEN: 150 | 28 days supply | Qty: 2 | Fill #6

## 2018-11-11 MED FILL — METOPROLOL SUCCINATE ER 50: 50 | 90 days supply | Qty: 90 | Fill #1

## 2018-11-11 MED FILL — TADALAFIL 20 MG TABS: 20 | 30 days supply | Qty: 6 | Fill #2

## 2018-11-22 IMAGING — CT CT ANGIO CHEST
2 of 7 series · 17 of 46 positions shown · IV contrast (isovue)
Comparison: 10/24/2016

CLINICAL DATA: Right upper extremity claudication

EXAM:
CT ANGIOGRAPHY CHEST WITH CONTRAST
TECHNIQUE: Multidetector CT imaging of the chest was performed using the
standard protocol during bolus administration of intravenous
contrast. Multiplanar CT image reconstructions and MIPs were
obtained to evaluate the vascular anatomy.
CONTRAST:  100 mL Isovue 370.

[Series 4: aorta 3.0 i31f 2 · axial · 0.78mm/px · z∈[-348,-54]mm · 14 of 110 slices shown]
[im 8/110  lung]
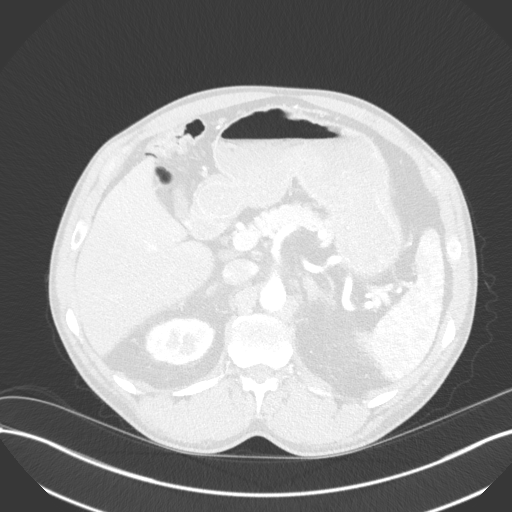
[im 16/110  soft-tissue]
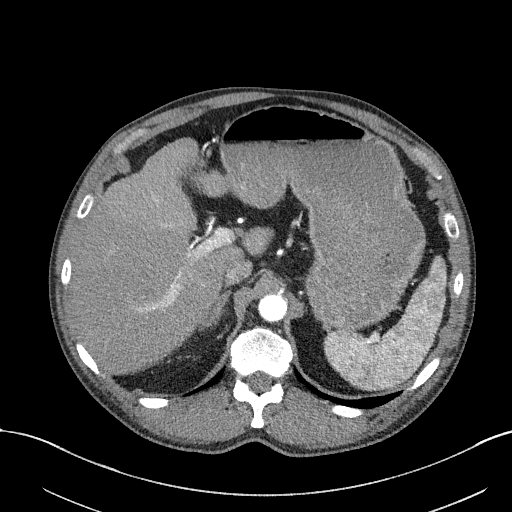
[im 23/110  lung]
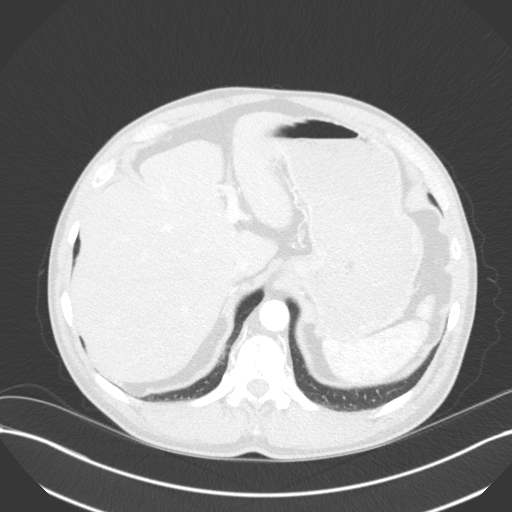
[im 31/110  soft-tissue]
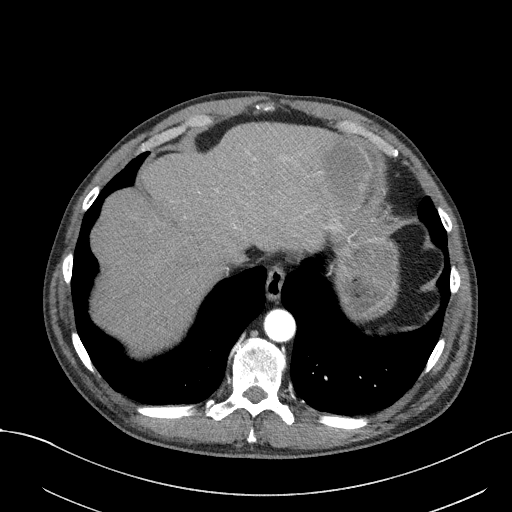
[im 38/110  lung]
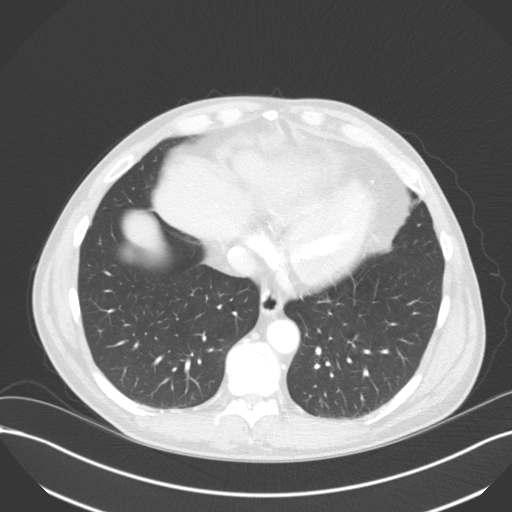
[im 46/110  soft-tissue]
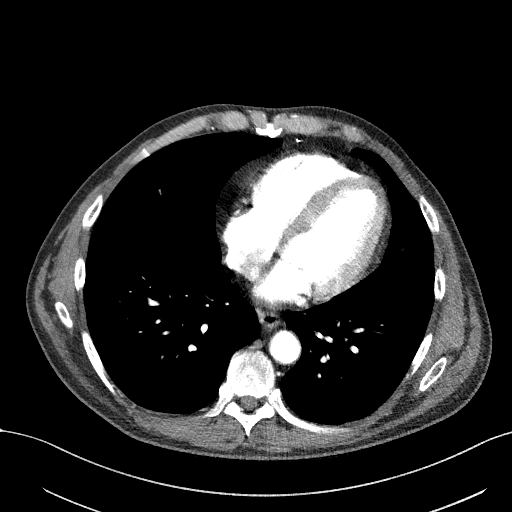
[im 53/110  lung]
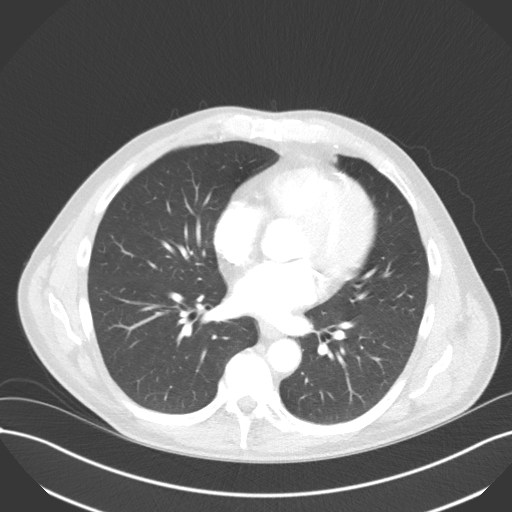
[im 61/110  soft-tissue]
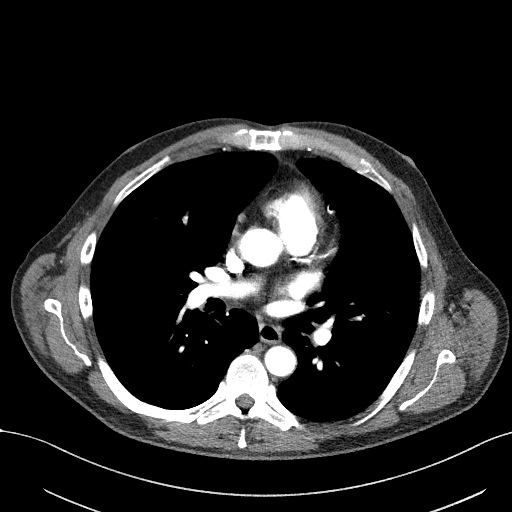
[im 68/110  lung]
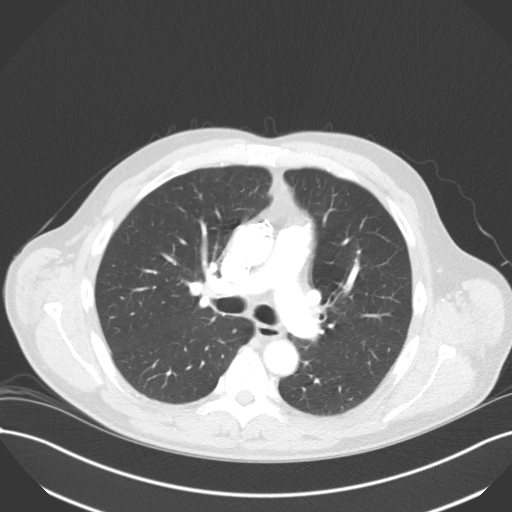
[im 76/110  soft-tissue]
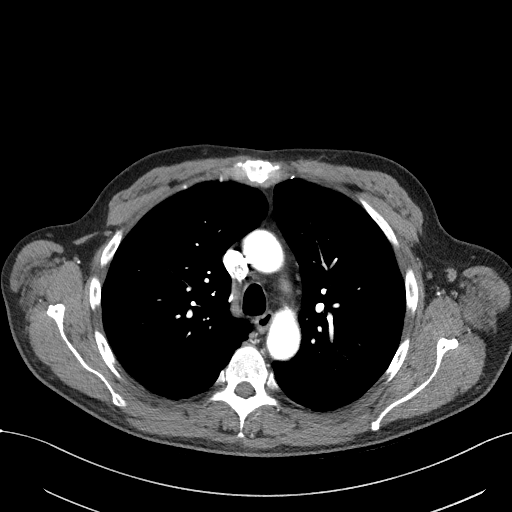
[im 83/110  lung]
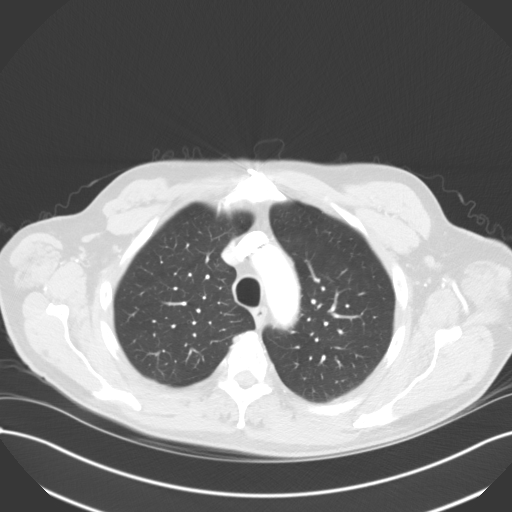
[im 91/110  soft-tissue]
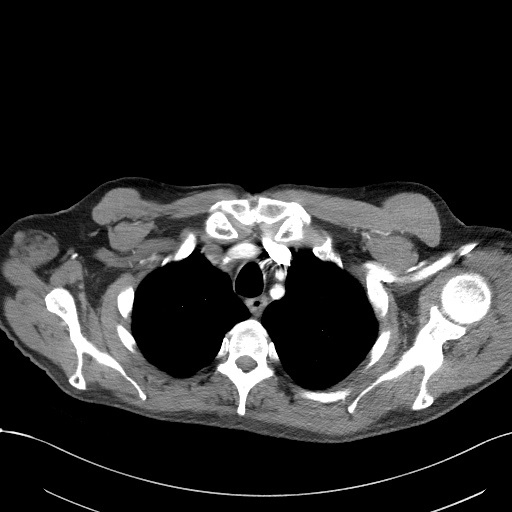
[im 98/110  lung]
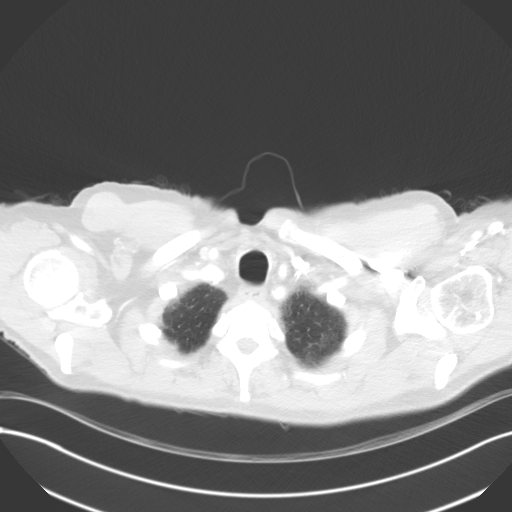
[im 106/110  soft-tissue]
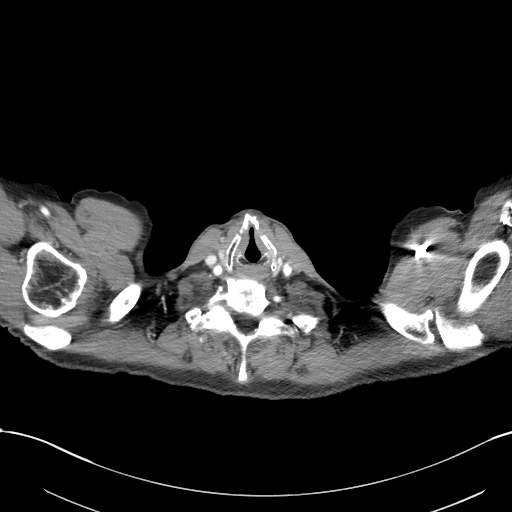

[Series 7: coronals · coronal · 0.66mm/px · 3 of 134 slices shown]
[im 34/134  soft-tissue]
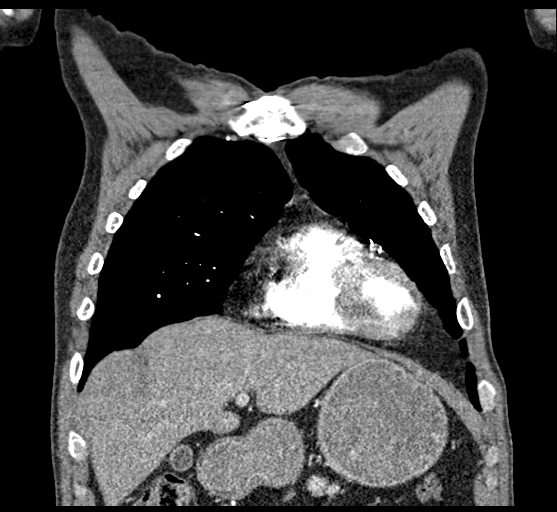
[im 67/134  soft-tissue]
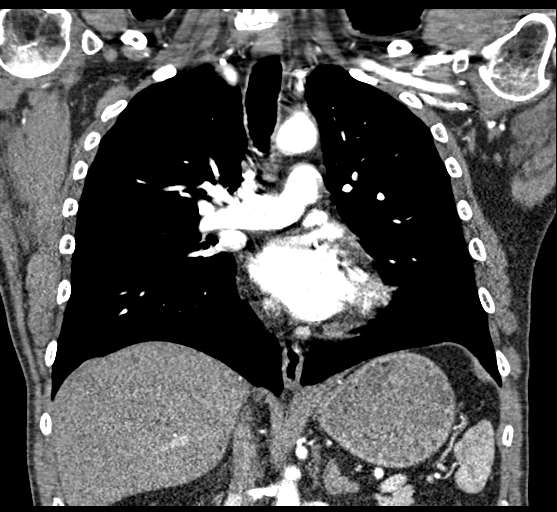
[im 100/134  soft-tissue]
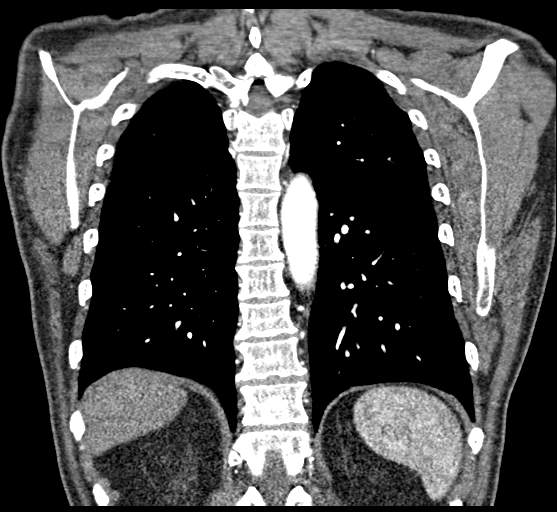

[17 of 46 positions shown; findings below may reference images not displayed]

FINDINGS: Cardiovascular: The thoracic aorta demonstrates mild atherosclerotic
calcifications as well as changes of prior coronary bypass grafting.
No focal aneurysmal dilatation or significant plaque formation is
noted. Pulmonary artery as visualized shows no definitive pulmonary
emboli although timing was not performed for pulmonary embolus
evaluation. Calcification at the origin of the left subclavian
artery is noted although no significant stenosis is noted.
Tortuosity of the proximal left vertebral artery is seen. The left
subclavian and axillary artery as visualized are widely patent
although some mild atherosclerotic plaque is noted in the midportion
of the left subclavian artery as it crosses the first rib.

A truncus anomaly is noted with the origin of the right innominate
artery and left common carotid artery sharing a common trunk.
Scatter artifact from the contrast bolus limits evaluation of a
portion of the proximal left common carotid artery although it
appears widely patent distally. The proximal right innominate artery
also appears widely patent although again is somewhat limited by the
scatter artifact from the contrast bolus. The origin of the right
common carotid artery is widely patent however just beyond the
origin of the right common carotid artery, the right subclavian
artery demonstrates some calcified and noncalcified atherosclerotic
plaque with focal narrowing of at least 70%. This is best visualized
on the sagittal and coronal reconstructions. The right vertebral
artery is widely patent. The more distal aspect of the right
subclavian artery demonstrates some mild calcific and non calcific
plaque without significant focal stenosis.

Coronary calcifications are noted. No significant cardiac
enlargement is seen.

Mediastinum/Nodes: No significant lymphadenopathy is noted. The
thoracic inlet is within normal limits. The esophagus as visualized
is unremarkable.

Lungs/Pleura: Lungs are clear. No pleural effusion or pneumothorax.

Upper Abdomen: The upper abdomen is within normal limits.

Musculoskeletal: Changes of prior new median sternotomy are seen. No
acute bony abnormality is noted.

Review of the MIP images confirms the above findings.
IMPRESSION: Calcified and noncalcified atherosclerotic plaque with each narrows
the proximal right subclavian artery by approximately 70%.

Mild atherosclerotic plaque involving the left subclavian artery
although this does not appear to be significantly flow limiting.

Mild atherosclerotic plaque in the distal right subclavian artery
without significant stenosis.

These results will be called to the ordering clinician or
representative by the Radiologist Assistant, and communication
documented in the PACS or zVision Dashboard.

## 2018-11-24 ENCOUNTER — Telehealth: Payer: Self-pay

## 2018-11-24 MED ORDER — ALIROCUMAB 150 MG/ML ~~LOC~~ SOAJ: 150.0000 mg | SUBCUTANEOUS | 6 refills | Status: DC

## 2018-11-24 NOTE — Telephone Encounter (Signed)
Called to let the pt know that the epa for praluent was approved and sent in the rx. I also instructed the pt to call us back if they have any questions

## 2018-12-04 ENCOUNTER — Other Ambulatory Visit: Payer: 59 | Admitting: Internal Medicine

## 2018-12-04 DIAGNOSIS — I6529 Occlusion and stenosis of unspecified carotid artery: Secondary | ICD-10-CM

## 2018-12-04 DIAGNOSIS — K219 Gastro-esophageal reflux disease without esophagitis: Secondary | ICD-10-CM

## 2018-12-04 DIAGNOSIS — I25119 Atherosclerotic heart disease of native coronary artery with unspecified angina pectoris: Secondary | ICD-10-CM

## 2018-12-04 DIAGNOSIS — M47812 Spondylosis without myelopathy or radiculopathy, cervical region: Secondary | ICD-10-CM

## 2018-12-04 DIAGNOSIS — Z0000 Encounter for general adult medical examination without abnormal findings: Secondary | ICD-10-CM | POA: Diagnosis not present

## 2018-12-04 DIAGNOSIS — I10 Essential (primary) hypertension: Secondary | ICD-10-CM

## 2018-12-04 DIAGNOSIS — E039 Hypothyroidism, unspecified: Secondary | ICD-10-CM

## 2018-12-04 LAB — CBC WITH DIFFERENTIAL/PLATELET
ABSOLUTE MONOCYTES: 376 {cells}/uL (ref 200–950)
BASOS ABS: 61 {cells}/uL (ref 0–200)
Basophils Relative: 1.6 %
EOS ABS: 182 {cells}/uL (ref 15–500)
Eosinophils Relative: 4.8 %
HCT: 42.9 % (ref 38.5–50.0)
Hemoglobin: 14.5 g/dL (ref 13.2–17.1)
Lymphs Abs: 589 cells/uL — ABNORMAL LOW (ref 850–3900)
MCH: 28.3 pg (ref 27.0–33.0)
MCHC: 33.8 g/dL (ref 32.0–36.0)
MCV: 83.8 fL (ref 80.0–100.0)
MONOS PCT: 9.9 %
MPV: 9.2 fL (ref 7.5–12.5)
Neutro Abs: 2592 cells/uL (ref 1500–7800)
Neutrophils Relative %: 68.2 %
Platelets: 202 10*3/uL (ref 140–400)
RBC: 5.12 10*6/uL (ref 4.20–5.80)
RDW: 13.8 % (ref 11.0–15.0)
TOTAL LYMPHOCYTE: 15.5 %
WBC: 3.8 10*3/uL (ref 3.8–10.8)

## 2018-12-04 LAB — COMPLETE METABOLIC PANEL WITH GFR
AG RATIO: 1.8 (calc) (ref 1.0–2.5)
ALT: 29 U/L (ref 9–46)
AST: 28 U/L (ref 10–35)
Albumin: 4.2 g/dL (ref 3.6–5.1)
Alkaline phosphatase (APISO): 55 U/L (ref 35–144)
BUN/Creatinine Ratio: 11 (calc) (ref 6–22)
BUN: 15 mg/dL (ref 7–25)
CALCIUM: 9.4 mg/dL (ref 8.6–10.3)
CO2: 33 mmol/L — AB (ref 20–32)
CREATININE: 1.31 mg/dL — AB (ref 0.70–1.25)
Chloride: 102 mmol/L (ref 98–110)
GFR, EST AFRICAN AMERICAN: 65 mL/min/{1.73_m2} (ref >=60)
GFR, Est Non African American: 56 mL/min/{1.73_m2} — ABNORMAL LOW (ref >=60)
GLOBULIN: 2.4 g/dL (ref 1.9–3.7)
Glucose, Bld: 91 mg/dL (ref 65–99)
POTASSIUM: 3.8 mmol/L (ref 3.5–5.3)
Sodium: 141 mmol/L (ref 135–146)
Total Bilirubin: 0.5 mg/dL (ref 0.2–1.2)
Total Protein: 6.6 g/dL (ref 6.1–8.1)

## 2018-12-04 LAB — LIPID PANEL
CHOL/HDL RATIO: 2.3 (calc) (ref <5.0)
Cholesterol: 162 mg/dL (ref <200)
HDL: 72 mg/dL (ref >=40)
LDL Cholesterol (Calc): 68 mg/dL (calc)
Non-HDL Cholesterol (Calc): 90 mg/dL (calc) (ref <130)
TRIGLYCERIDES: 138 mg/dL (ref <150)

## 2018-12-04 LAB — PSA: PSA: 6.6 ng/mL — AB (ref <=4.0)

## 2018-12-04 LAB — TSH: TSH: 15.62 mIU/L — ABNORMAL HIGH (ref 0.40–4.50)

## 2018-12-08 MED FILL — PRALUENT 150 MG/ML PEN: 150 | 28 days supply | Qty: 2 | Fill #0

## 2018-12-08 MED FILL — DEXTROAMPH TB 30MG NSTR 100: 30 | 90 days supply | Qty: 270 | Fill #0

## 2018-12-08 MED FILL — LORazepam 1 MG TABS: 1 | 90 days supply | Qty: 360 | Fill #0

## 2018-12-08 MED FILL — TRINTELLIX 5 MG TABLET: 5 | 30 days supply | Qty: 90 | Fill #3

## 2018-12-08 MED FILL — TADALAFIL 20 MG TABS: 20 | 30 days supply | Qty: 6 | Fill #3

## 2018-12-09 ENCOUNTER — Ambulatory Visit (INDEPENDENT_AMBULATORY_CARE_PROVIDER_SITE_OTHER): Payer: 59 | Admitting: Internal Medicine

## 2018-12-09 ENCOUNTER — Other Ambulatory Visit: Payer: Self-pay

## 2018-12-09 ENCOUNTER — Encounter: Payer: Self-pay | Admitting: Internal Medicine

## 2018-12-09 VITALS — BP 150/90 | HR 58 | Ht 67.0 in | Wt 161.0 lb

## 2018-12-09 DIAGNOSIS — Z951 Presence of aortocoronary bypass graft: Secondary | ICD-10-CM | POA: Diagnosis not present

## 2018-12-09 DIAGNOSIS — Z8659 Personal history of other mental and behavioral disorders: Secondary | ICD-10-CM

## 2018-12-09 DIAGNOSIS — R7989 Other specified abnormal findings of blood chemistry: Secondary | ICD-10-CM

## 2018-12-09 DIAGNOSIS — Z0000 Encounter for general adult medical examination without abnormal findings: Secondary | ICD-10-CM | POA: Diagnosis not present

## 2018-12-09 DIAGNOSIS — D0007 Carcinoma in situ of tongue: Secondary | ICD-10-CM

## 2018-12-09 DIAGNOSIS — E7849 Other hyperlipidemia: Secondary | ICD-10-CM

## 2018-12-09 DIAGNOSIS — M5090 Cervical disc disorder, unspecified, unspecified cervical region: Secondary | ICD-10-CM

## 2018-12-09 DIAGNOSIS — K219 Gastro-esophageal reflux disease without esophagitis: Secondary | ICD-10-CM | POA: Diagnosis not present

## 2018-12-09 DIAGNOSIS — G902 Horner's syndrome: Secondary | ICD-10-CM | POA: Diagnosis not present

## 2018-12-09 DIAGNOSIS — R030 Elevated blood-pressure reading, without diagnosis of hypertension: Secondary | ICD-10-CM

## 2018-12-09 DIAGNOSIS — Z8673 Personal history of transient ischemic attack (TIA), and cerebral infarction without residual deficits: Secondary | ICD-10-CM

## 2018-12-09 DIAGNOSIS — Z789 Other specified health status: Secondary | ICD-10-CM

## 2018-12-09 DIAGNOSIS — R972 Elevated prostate specific antigen [PSA]: Secondary | ICD-10-CM

## 2018-12-09 DIAGNOSIS — I6521 Occlusion and stenosis of right carotid artery: Secondary | ICD-10-CM

## 2018-12-09 DIAGNOSIS — E039 Hypothyroidism, unspecified: Secondary | ICD-10-CM

## 2018-12-09 DIAGNOSIS — I10 Essential (primary) hypertension: Secondary | ICD-10-CM

## 2018-12-09 LAB — CREATININE, SERUM: CREATININE: 1.15 mg/dL (ref 0.70–1.25)

## 2018-12-10 MED ORDER — LEVOTHYROXINE SODIUM 75 MCG PO TABS: 75.0000 ug | ORAL_TABLET | Freq: Every day | ORAL | 0 refills | Status: DC

## 2018-12-10 MED FILL — LEVOTHYROXINE 75 MCG TABLET: 75 | 90 days supply | Qty: 90 | Fill #0

## 2018-12-30 NOTE — Progress Notes (Signed)
Subjective:    Patient ID: [name], male    DOB: 09-Jun-1951, 68 y.o.   MRN: [id number]  HPI 68 year old male for health maintenance exam and evaluation of medical issues.  He has a history of hypertension.  History of blood pressure differential due to subclavian stenosis.  History of hyperlipidemia but is statin intolerant.  History of coronary artery disease followed by Dr. Alvester Chou.  History of BPH but not a lot of symptoms.  History of low white blood cell count in the 3700 range which is been followed for several years and is asymptomatic.  History of depression, hypothyroidism, status post CABG x5 in 2009, history of smoking, left Horner syndrome, history of stroke, cervical disc disease, squamous cell carcinoma of the tongue, external carotid artery stenosis on the left, history of migraine headaches, allergic rhinitis, attention deficit disorder, cervical disc disease and back pain.  Issues with left rotator cuff but has not been able to have surgery due to cardiac issues.  In September 2017 he had PCI and drug-eluting stent of a high-grade circumflex and first obtuse marginal branch stenosis in the setting of an occluded sequential vein graft with a patent continuation of OM1-OM 2.  History of 70% right subclavian artery stenosis.  Percutaneous intervention has been considered but has not been done yet  Left shoulder arthroscopy with subacromial decompression and open rotator cuff repair by Dr. Veverly Fells 2014.  Open distal clavicle resection and open biceps tendodesis.  He has a 50 mm upper extremity blood pressure differential probably related to right subclavian and or innominate stenosis.  Statin intolerant to take Zetia.  In January 2018 he was seen in the emergency department with palpitations diaphoresis and lightheadedness.  D-dimer was elevated at 0.75.  He had CT chest angio with contrast showing no evidence of PE.  Was thought not to have had an MI and was discharged home.   CT angios of chest and aorta with and without contrast most recently done on 2018 showed 70% proximal right subclavian artery stenosis.  Mild plaque in left subclavian artery.  Mild plaque in distal right subclavian artery.  He has chronic occlusion of the distal right vertebral artery.  He had vasectomy of the C-spine around 2000.  He developed left eye ptosis after cardiac surgery.  He says his face feels numb when he turns his head to the left.  He wears contact lenses  Colonoscopy Dr. Collene Mares in 2009  He had a left thalamic stroke in June 2005.  History of numbness of the left cheek in the C3 distribution.  Documented facet disease on the left at C2/C3 C4-C5 C5-C6 as well as C6-C7 which is aggravated by turning his head.  Erectile dysfunction, BPH, seborrheic dermatitis, adult attention deficit disorder, depression and allergic rhinitis  His previous neurologist, Dr. Erling Cruz who is now retired felt that patient's carotid disease was due to radiation therapy.  He had squamous cell carcinoma of the tongue which was treated with radiation therapy in 2002.  History of GE reflux.  Since he had viral hepatitis a number of years ago.  Social history: Wife works for [employer].  He has been working in Architect.  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  He smoked for 30 years but quit over 20 years ago.  One son from previous marriage.  This is his second marriage.  He does drink beer.  Family history: Mother died of ovarian cancer.  Father died apparently from heart failure but also had history of stroke around age 66 and diabetes.  Maternal grandmother, paternal grandmother with history of cancer.  He has 2 brothers.    Review of Systems  Constitutional: Negative.   Respiratory: Negative.   Cardiovascular: Negative for chest pain and palpitations.  Gastrointestinal: Negative.   Psychiatric/Behavioral:  Negative.        Objective:   Physical Exam Vitals signs reviewed.  Constitutional:      Appearance: Normal appearance. He is not toxic-appearing or diaphoretic.  HENT:     Right Ear: Tympanic membrane normal.     Left Ear: Tympanic membrane normal.     Nose: Nose normal.     Mouth/Throat:     Mouth: Mucous membranes are moist.     Pharynx: Oropharynx is clear.  Eyes:     General: No scleral icterus.       Right eye: No discharge.        Left eye: No discharge.     Extraocular Movements: Extraocular movements intact.     Conjunctiva/sclera: Conjunctivae normal.     Pupils: Pupils are equal, round, and reactive to light.  Neck:     Musculoskeletal: Neck supple. No neck rigidity.     Vascular: No carotid bruit.     Comments: No thyromegaly Cardiovascular:     Rate and Rhythm: Normal rate and regular rhythm.     Heart sounds: Normal heart sounds. No murmur. No gallop.   Pulmonary:     Effort: Pulmonary effort is normal. No respiratory distress.     Breath sounds: No wheezing or rales.  Abdominal:     General: Bowel sounds are normal. There is no distension.     Palpations: Abdomen is soft.     Tenderness: There is no abdominal tenderness. There is no guarding or rebound.  Genitourinary:    Comments: Prostate slightly enlarged but no nodules appreciated Musculoskeletal:     Right lower leg: No edema.     Left lower leg: No edema.  Lymphadenopathy:     Cervical: No cervical adenopathy.  Skin:    General: Skin is warm and dry.  Neurological:     General: No focal deficit present.     Mental Status: He is alert and oriented to person, place, and time.  Psychiatric:        Mood and Affect: Mood normal.        Behavior: Behavior normal.        Thought Content: Thought content normal.        Judgment: Judgment normal.           Assessment & Plan:  Blood pressure discrepancy in arms due to subclavian artery stenosis-remember to check blood pressure in each arm at  each visit  Status post PCI by Dr. Gwenlyn Found  Hyperlipidemia treated with Zetia  Essential hypertension  History of coronary artery disease status post CABG  History of carcinoma base of tongue  History of carotid disease  History of cervical disc disease  Hypothyroidism-takes thyroid replacement therapy however his TSH is elevated in the 15 range.  We have failed levothyroxine 0.75  mg daily and he is to follow-up in approximately 6 weeks.  History of left thalamic stroke 2005  History of left Horner's syndrome  History of right vertebral artery occlusion  Elevated PSA of 6.6 will be referred to urologist  50% stenosis right proximal internal carotid artery and  severe narrowing of the right external carotid artery  Anxiety depression stable  History of left shoulder arthropathy  GE reflux  He has a mildly elevated serum creatinine of 1.31.  This was repeated on March 10 and is normal at 1.15  Plan: Continue same medications and return in 6 months  His blood pressure is elevated today.  He quit taking the losartan about a month ago.  He thought it was making him tired and dizzy.

## 2018-12-30 NOTE — Patient Instructions (Signed)
Urology referral for elevated PSA.  Take thyroid medication as prescribed and follow-up here in 6 weeks.  We will follow-up on blood pressure at that time.

## 2019-01-06 ENCOUNTER — Other Ambulatory Visit: Payer: Self-pay | Admitting: Cardiovascular Disease

## 2019-01-06 MED FILL — PANTOPRAZOLE SOD DR 40 MG T: 40 | 90 days supply | Qty: 90 | Fill #3

## 2019-01-06 MED FILL — HYDROCHLOROTHIAZIDE 12.5 MG: 12.5 | 90 days supply | Qty: 90 | Fill #3

## 2019-01-06 MED FILL — TRINTELLIX 5 MG TABLET: 5 | 30 days supply | Qty: 90 | Fill #0

## 2019-01-06 MED FILL — AMLODIPINE BESYLATE 10 MG T: 10 | 90 days supply | Qty: 90 | Fill #3

## 2019-01-06 MED FILL — CLOPIDOGREL 75 MG TABLET: 75 | 90 days supply | Qty: 90 | Fill #0

## 2019-01-06 MED FILL — PRALUENT 150 MG/ML PEN: 150 | 28 days supply | Qty: 2 | Fill #1

## 2019-01-06 MED FILL — TADALAFIL 20 MG TABS: 20 | 30 days supply | Qty: 6 | Fill #4

## 2019-01-06 NOTE — Telephone Encounter (Signed)
clopidogrel refilled.

## 2019-01-20 ENCOUNTER — Other Ambulatory Visit: Payer: Self-pay

## 2019-01-20 ENCOUNTER — Other Ambulatory Visit: Payer: 59 | Admitting: Internal Medicine

## 2019-01-20 VITALS — Temp 98.2°F

## 2019-01-20 DIAGNOSIS — E039 Hypothyroidism, unspecified: Secondary | ICD-10-CM

## 2019-01-21 LAB — TSH: TSH: 3.02 mIU/L (ref 0.40–4.50)

## 2019-01-22 ENCOUNTER — Encounter: Payer: Self-pay | Admitting: Internal Medicine

## 2019-01-22 ENCOUNTER — Other Ambulatory Visit: Payer: Self-pay

## 2019-01-22 ENCOUNTER — Ambulatory Visit (INDEPENDENT_AMBULATORY_CARE_PROVIDER_SITE_OTHER): Payer: 59 | Admitting: Internal Medicine

## 2019-01-22 VITALS — BP 98/63 | Ht 67.0 in | Wt 158.0 lb

## 2019-01-22 DIAGNOSIS — J0100 Acute maxillary sinusitis, unspecified: Secondary | ICD-10-CM | POA: Diagnosis not present

## 2019-01-22 DIAGNOSIS — E039 Hypothyroidism, unspecified: Secondary | ICD-10-CM | POA: Diagnosis not present

## 2019-01-22 DIAGNOSIS — M5431 Sciatica, right side: Secondary | ICD-10-CM

## 2019-01-22 MED ORDER — AZITHROMYCIN 250 MG PO TABS: ORAL_TABLET | ORAL | 0 refills | Status: DC

## 2019-01-22 MED ORDER — HYDROCOD POLST-CPM POLST ER 10-8 MG/5ML PO SUER: 5.0000 mL | Freq: Two times a day (BID) | ORAL | 0 refills | Status: DC | PRN

## 2019-01-22 MED ORDER — PREDNISONE 10 MG PO TABS: ORAL_TABLET | ORAL | 0 refills | Status: DC

## 2019-01-22 MED FILL — predniSONE 10 MG TABS: 10 | 6 days supply | Qty: 21 | Fill #0

## 2019-01-22 MED FILL — AZITHROMYCIN 250 MG TABLET: 250 | 5 days supply | Qty: 6 | Fill #0

## 2019-01-22 MED FILL — HYDROCODONE-CHLORPHEN ER SU: 10-8 | 14 days supply | Qty: 140 | Fill #0

## 2019-01-22 NOTE — Progress Notes (Signed)
   Subjective:    Patient ID: [name], male    DOB: Feb 29, 1951, 68 y.o.   MRN: [id number]  HPI 87 year of Male with 4 weeks history of back pain.  Pain is radiating around right back into the right thigh.  Does not recall any specific injury.  Complaining of nasal congestion and cough.  Also here for follow-up on hypothyroidism.  Due to Coronavirus pandemic, patient  seen by interactive audio and video telecommunications today.  Interactive audio and visual telecommunications were achieved between this provider and patient without difficulty.  He gives verbal consent today for this visit format.  He is identified by 2 identifiers as [name]. [name], a patient in this practice.  A month ago his TSH was 15.62 he did have prior history of hypothyroidism.  He may have missed some doses.  We refilled 0.075 mg of levothyroxine in March and he is here for follow-up.  TSH is now normal at 3.12 and he will be continued at that same dose.  Patient denies any weakness or numbness in the right lower extremity.    Review of Systems see above regarding back and thigh pain     Objective:   Physical Exam He appears to be in no acute distress.  His wife is present with him today on this visit       Assessment & Plan:  Hypothyroidism-TSH stable on levothyroxine 0.075 mg daily.  Follow-up in 6 months.  Right sciatica/radiculopathy.  He will be treated with prednisone 10 mg tablets in a 6-day tapering course  For acute lower respiratory infection, he would like to have some Tussionex for cough which will be prescribed.  He was also prescribed a Zithromax Z-PAK take 2 tablets day 1 followed by 1 tablet p.o. days 2 through 5  Follow-up in 6 months.  If sciatica persists, he will need physical therapy.  25 minutes spent with patient evaluating these issues described above

## 2019-01-29 DIAGNOSIS — R972 Elevated prostate specific antigen [PSA]: Secondary | ICD-10-CM | POA: Diagnosis not present

## 2019-01-29 NOTE — Patient Instructions (Signed)
Take prednisone in tapering course as directed going from 6 tablets to 0 tablets over 7 days tapering by 1 less tablet daily.  Continue same dose of thyroid replacement.  Prednisone should help right-sided sciatica, maxillary sinusitis symptoms.  Take Tussionex sparingly for cough.  Sciatica persist will need physical therapy.  Follow-up in 6 months

## 2019-01-30 MED FILL — levoFLOXacin 750 MG TABS: 750 | 1 days supply | Qty: 1 | Fill #0

## 2019-02-03 ENCOUNTER — Telehealth: Payer: Self-pay | Admitting: Cardiovascular Disease

## 2019-02-03 NOTE — Telephone Encounter (Signed)
Called patient, he states he has SOB with exertion, and if he stands up he feels he could fall out- has worsened over the past few weeks. PCP gave him medication to help with this upper respiratory infection.   Denies chest pains, or swelling. Does mention having cramps in hands/feet  112/68 BP after episode an hour ago, patient did not fall but felt very weak and lightheaded. No vision changes.   Patient would like recommendations from [facility] and nurse on if he should be seen.  Will route to MD.  Advised for patient if episode happened again to call 911, so he does not fall out and hurt himself. Patient verbalized understanding.

## 2019-02-03 NOTE — Telephone Encounter (Signed)
[name] needs a virtual office visit with me next week

## 2019-02-03 NOTE — Telephone Encounter (Signed)
Pt c/o Shortness Of Breath: STAT if SOB developed within the last 24 hours or pt is noticeably SOB on the phone  1. Are you currently SOB (can you hear that pt is SOB on the phone)? Not this min and no I can not hear patient.  2. How long have you been experiencing SOB? 2 weeks and getting worse.  3. Are you SOB when sitting or when up moving around? Standing up and sitting.  4. Are you currently experiencing any other symptoms? upper respiratory.

## 2019-02-04 NOTE — Telephone Encounter (Signed)
appt set for 5/15     Virtual Visit Pre-Appointment Phone Call  "(Name), I am calling you today to discuss your upcoming appointment. We are currently trying to limit exposure to the virus that causes COVID-19 by seeing patients at home rather than in the office."  1. "What is the BEST phone number to call the day of the visit?" - include this in appointment notes  2. "Do you have or have access to (through a family member/friend) a smartphone with video capability that we can use for your visit?" a. If yes - list this number in appt notes as "cell" (if different from BEST phone #) and list the appointment type as a VIDEO visit in appointment notes b. If no - list the appointment type as a PHONE visit in appointment notes  3. Confirm consent - "In the setting of the current Covid19 crisis, you are scheduled for a (phone or video) visit with your provider on (date) at (time).  Just as we do with many in-office visits, in order for you to participate in this visit, we must obtain consent.  If you'd like, I can send this to your mychart (if signed up) or email for you to review.  Otherwise, I can obtain your verbal consent now.  All virtual visits are billed to your insurance company just like a normal visit would be.  By agreeing to a virtual visit, we'd like you to understand that the technology does not allow for your provider to perform an examination, and thus may limit your provider's ability to fully assess your condition. If your provider identifies any concerns that need to be evaluated in person, we will make arrangements to do so.  Finally, though the technology is pretty good, we cannot assure that it will always work on either your or our end, and in the setting of a video visit, we may have to convert it to a phone-only visit.  In either situation, we cannot ensure that we have a secure connection.  Are you willing to proceed?" STAFF: Did the patient verbally acknowledge consent to  telehealth visit? Document YES/NO here: yes  4. Advise patient to be prepared - "Two hours prior to your appointment, go ahead and check your blood pressure, pulse, oxygen saturation, and your weight (if you have the equipment to check those) and write them all down. When your visit starts, your provider will ask you for this information. If you have an Apple Watch or Kardia device, please plan to have heart rate information ready on the day of your appointment. Please have a pen and paper handy nearby the day of the visit as well."  5. Give patient instructions for MyChart download to smartphone OR Doximity/Doxy.me as below if video visit (depending on what platform provider is using)  6. Inform patient they will receive a phone call 15 minutes prior to their appointment time (may be from unknown caller ID) so they should be prepared to answer    TELEPHONE CALL NOTE  [name] has been deemed a candidate for a follow-up tele-health visit to limit community exposure during the Covid-19 pandemic. I spoke with the patient via phone to ensure availability of phone/video source, confirm preferred email & phone number, and discuss instructions and expectations.  I reminded [name] to be prepared with any vital sign and/or heart rhythm information that could potentially be obtained via home monitoring, at the time of his visit. I reminded [name] to  expect a phone call prior to his visit.  Eren Ryser Dawna Part, RN 02/04/2019 12:12 PM   INSTRUCTIONS FOR DOWNLOADING THE MYCHART APP TO SMARTPHONE  - The patient must first make sure to have activated MyChart and know their login information - If Apple, go to [employer] and type in MyChart in the search bar and download the app. If Android, ask patient to go to [employer] and type in [place] in the search bar and download the app. The app is free but as with any other app downloads, their phone may require them to verify saved  payment information or Apple/Android password.  - The patient will need to then log into the app with their MyChart username and password, and select [facility] as their healthcare provider to link the account. When it is time for your visit, go to the MyChart app, find appointments, and click Begin Video Visit. Be sure to Select Allow for your device to access the Microphone and Camera for your visit. You will then be connected, and your provider will be with you shortly.  **If they have any issues connecting, or need assistance please contact MyChart service desk [phone])**  **If using a computer, in order to ensure the best quality for their visit they will need to use either of the following Internet Browsers: [employer], or Google Chrome**  IF USING DOXIMITY or DOXY.ME - The patient will receive a link just prior to their visit by text.     FULL LENGTH CONSENT FOR TELE-HEALTH VISIT   I hereby voluntarily request, consent and authorize [facility] and its employed or contracted physicians, physician assistants, nurse practitioners or other licensed health care professionals (the Practitioner), to provide me with telemedicine health care services (the "Services") as deemed necessary by the treating Practitioner. I acknowledge and consent to receive the Services by the Practitioner via telemedicine. I understand that the telemedicine visit will involve communicating with the Practitioner through live audiovisual communication technology and the disclosure of certain medical information by electronic transmission. I acknowledge that I have been given the opportunity to request an in-person assessment or other available alternative prior to the telemedicine visit and am voluntarily participating in the telemedicine visit.  I understand that I have the right to withhold or withdraw my consent to the use of telemedicine in the course of my care at any time, without affecting  my right to future care or treatment, and that the Practitioner or I may terminate the telemedicine visit at any time. I understand that I have the right to inspect all information obtained and/or recorded in the course of the telemedicine visit and may receive copies of available information for a reasonable fee.  I understand that some of the potential risks of receiving the Services via telemedicine include:  Marland Kitchen Delay or interruption in medical evaluation due to technological equipment failure or disruption; . Information transmitted may not be sufficient (e.g. poor resolution of images) to allow for appropriate medical decision making by the Practitioner; and/or  . In rare instances, security protocols could fail, causing a breach of personal health information.  Furthermore, I acknowledge that it is my responsibility to provide information about my medical history, conditions and care that is complete and accurate to the best of my ability. I acknowledge that Practitioner's advice, recommendations, and/or decision may be based on factors not within their control, such as incomplete or inaccurate data provided by me or distortions of diagnostic images or specimens  that may result from electronic transmissions. I understand that the practice of medicine is not an exact science and that Practitioner makes no warranties or guarantees regarding treatment outcomes. I acknowledge that I will receive a copy of this consent concurrently upon execution via email to the email address I last provided but may also request a printed copy by calling the office of [facility].    I understand that my insurance will be billed for this visit.   I have read or had this consent read to me. . I understand the contents of this consent, which adequately explains the benefits and risks of the Services being provided via telemedicine.  . I have been provided ample opportunity to ask questions regarding this consent and the  Services and have had my questions answered to my satisfaction. . I give my informed consent for the services to be provided through the use of telemedicine in my medical care  By participating in this telemedicine visit I agree to the above.

## 2019-02-09 DIAGNOSIS — R972 Elevated prostate specific antigen [PSA]: Secondary | ICD-10-CM | POA: Diagnosis not present

## 2019-02-09 MED FILL — TRINTELLIX 5 MG TABLET: 5 | 30 days supply | Qty: 90 | Fill #1

## 2019-02-09 MED FILL — TADALAFIL 20 MG TABS: 20 | 30 days supply | Qty: 6 | Fill #5

## 2019-02-09 MED FILL — PRALUENT 150 MG/ML PEN: 150 | 28 days supply | Qty: 2 | Fill #2

## 2019-02-10 DIAGNOSIS — R972 Elevated prostate specific antigen [PSA]: Secondary | ICD-10-CM | POA: Diagnosis not present

## 2019-02-13 ENCOUNTER — Encounter: Payer: Self-pay | Admitting: Cardiovascular Disease

## 2019-02-13 ENCOUNTER — Telehealth: Payer: Self-pay

## 2019-02-13 ENCOUNTER — Telehealth (INDEPENDENT_AMBULATORY_CARE_PROVIDER_SITE_OTHER): Payer: 59 | Admitting: Cardiovascular Disease

## 2019-02-13 DIAGNOSIS — Z95820 Peripheral vascular angioplasty status with implants and grafts: Secondary | ICD-10-CM | POA: Diagnosis not present

## 2019-02-13 DIAGNOSIS — I2583 Coronary atherosclerosis due to lipid rich plaque: Secondary | ICD-10-CM | POA: Diagnosis not present

## 2019-02-13 DIAGNOSIS — I10 Essential (primary) hypertension: Secondary | ICD-10-CM | POA: Diagnosis not present

## 2019-02-13 DIAGNOSIS — I6523 Occlusion and stenosis of bilateral carotid arteries: Secondary | ICD-10-CM

## 2019-02-13 DIAGNOSIS — I2510 Atherosclerotic heart disease of native coronary artery without angina pectoris: Secondary | ICD-10-CM

## 2019-02-13 DIAGNOSIS — E782 Mixed hyperlipidemia: Secondary | ICD-10-CM

## 2019-02-13 NOTE — Telephone Encounter (Signed)
Patient and/or DPR-approved person aware of AVS instructions and verbalized understanding. Letter including After Visit Summary and any other necessary documents to be mailed to the patient's address on file.  

## 2019-02-13 NOTE — Patient Instructions (Signed)
Medication Instructions:  Your physician recommends that you continue on your current medications as directed. Please refer to the Current Medication list given to you today.  If you need a refill on your cardiac medications before your next appointment, please call your pharmacy.   Lab work: NONE If you have labs (blood work) drawn today and your tests are completely normal, you will receive your results only by: Marland Kitchen MyChart Message (if you have MyChart) OR . A paper copy in the mail If you have any lab test that is abnormal or we need to change your treatment, we will call you to review the results.  Testing/Procedures: Your physician has requested that you have a carotid duplex. This test is an ultrasound of the carotid arteries in your neck. It looks at blood flow through these arteries that supply the brain with blood. Allow one hour for this exam. There are no restrictions or special instructions. YOU WILL BE CONTACTED BY A SCHEDULER TO SET UP AN APPOINTMENT FOR AUGUST 2020.   Follow-Up: At [facility], you and your health needs are our priority.  As part of our continuing mission to provide you with exceptional heart care, we have created designated Provider Care Teams.  These Care Teams include your primary Cardiologist (physician) and Advanced Practice Providers (APPs -  Physician Assistants and Nurse Practitioners) who all work together to provide you with the care you need, when you need it. . You will need a follow up appointment in 6 months with an APP and in 12 months with Dr. Gwenlyn Found.  Please call our office 2 months in advance to schedule each appointment.  You may see one of the following Advanced Practice Providers on your designated Care Team:   . Kerin Ransom, [place] . Almyra Deforest, PA-C . Fabian Sharp, PA-C . Jory Sims, DNP . Rosaria Ferries, PA-C . Roby Lofts, PA-C . Sande Rives, PA-C

## 2019-02-13 NOTE — Progress Notes (Signed)
Virtual Visit via Video Note   This visit type was conducted due to national recommendations for restrictions regarding the COVID-19 Pandemic (e.g. social distancing) in an effort to limit this patient's exposure and mitigate transmission in our community.  Due to his co-morbid illnesses, this patient is at least at moderate risk for complications without adequate follow up.  This format is felt to be most appropriate for this patient at this time.  All issues noted in this document were discussed and addressed.  A limited physical exam was performed with this format.  Please refer to the patient's chart for his consent to telehealth for [facility].   Date:  02/13/2019   ID:  [name], DOB 1950-12-19, MRN [id number]  Patient Location: Home Provider Location: Home  PCP:  Gregory Showers, MD  Cardiologist:  Gregory Burow, MD  Electrophysiologist:  None   Evaluation Performed:  Follow-Up Visit  Chief Complaint: Follow-up CAD  History of Present Illness:    [name] is a 68 y.o.  thin-appearing, married Caucasian male, father of 8, grandfather to 4 grandchildren who I last saw  05/02/2018. He has a history of coronary artery disease status post bypass grafting x5 by Dr. Merilynn [name] June 16, 2008, with a free LIMA to his LAD, sequential vein to the first diagonal branch, first and second obtuse marginal branches, as well as a vein to the PDA. His other problems include hypertension and hyperlipidemia. He denies chest pain or shortness of breath. I did perform cerebral angiography on him May 05, 2008, because of a question of a high-grade internal carotid artery stenosis read by the radiologist. However, this turned out to be a left external carotid artery stenosis. He does have high-grade ostial left internal mammary artery stenosis which necessitated the use of a free LIMA. He is statin intolerant. He has had squamous cell cancer at the base of his tongue back in 2000  and radiation therapy to his head and neck. A Myoview stress test performed December 11, 2011, was normal and carotid Dopplers did show a moderately severe right ICA stenosis which we have been following by duplex ultrasound. 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 Since I saw him 12 months ago he has noticed increasing dyspnea on exertion over the last several months, especially noticeable while mowing his lawn. I obtained a Myoview stress test on him based on this 03/29/16 that showed a large reversible lateral wall perfusion defect consistent with ischemia.He underwent outpatient cardiac catheterization by myself on 05/14/16 revealing severe native and graft vessel disease with normal LV function. Specifically, his RCA vein graft was occluded to an occluded dominant right and his circumflex vein graft which was sequential to 2 obtuse marginal branches was occluded at the aorta to patent continuation between OM1 and 2. He did have native circumflex disease. Dr. Roxan [name] saw him during his brief hospitalization to discuss the possibility of redo coronary artery bypass grafting although the consensus was that he would be better served with attempted PCI and stenting of his native circumflex coronary artery. I perform this electively on 06/14/16 and implanted a 2.25 mm x 38 mm long synergy drug-eluting stent in his AV groove circumflex into his obtuse marginal branch. He had excellent angiographic and clinical result. He was discharged on the following day. His dyspnea ultimately resolved. Since I saw  him in the office 80months ago he's remained clinically stable.He does have a right shoulder orthopedic issue but apparently he is not a surgical candidate. Since I saw him a year ago he remained stable.  He denies chest pain.  He does complain of some  breathing abnormality which does not does not necessarily sound like shortness of breath.  He had an excellent lipid profile performed by his PCP on 12/04/2018 with a total cholesterol 162, LDL 68 and HDL 72 on Praluent.  He has developed increasing PSA and is scheduled to have a prostate biopsy by Dr. Rosana [name].  He can stop his antiplatelet drugs for a week prior to that safely.  The patient does not have symptoms concerning for COVID-19 infection (fever, chills, cough, or new shortness of breath).    Past Medical History:  Diagnosis Date   Anxiety    Arthritis    "NECK, HANDS, KNEES" (06/14/2016)   CAD (coronary artery disease)    Carotid artery stenosis    Complication of anesthesia    Reports that he had trouble swallowing post anesthesia- CABG   COPD (chronic obstructive pulmonary disease) (HCC) 12/11/2011   r/s mv - EF 72%; exercise capacity 13 METS; no exercised induced ischemic EKG changes   Depression, major    GERD (gastroesophageal reflux disease)    HTN (hypertension), benign 04/16/2008   echo - EF >55%; no regional wall or valvular abnormalities   Hyperlipidemia    statin intolerant   Hypothyroidism    Migraine    "crippling headaches as a kid; silent migraines now, no pain, couple times/week" (06/14/2016)   S/P angioplasty with stent 06/14/16 PCI & DES of high grade AV groove LCX and 1st OM  06/15/2016   Squamous cell cancer of tongue ([place]) ~ 2002   "35 radiation treatments at [facility]"   Subclavian steal syndrome 06/17/2012   carotid doppler - R systolic brachial pressure 143mmHg, L 18mmHg; R subclavian artery - proxmial obsstruction w/ abnormal monophasic waveforms, R ECA known occlusive disease; L ECA narrowing w/ 70-99% diameter reductiona   TIA (transient ischemic attack) 2005   Viral hepatitis 1970s   "non specific"   Past Surgical History:  Procedure Laterality Date   BACK SURGERY     BIOPSY  02/20/2018   Procedure: BIOPSY;  Surgeon: Gregory Craver,  MD;  Location: [facility];  Service: Endoscopy;;   BIOPSY TONGUE  ~ 2002   "base of tongue and uvula"   CARDIAC CATHETERIZATION  04/26/2008   L circumflex 99% stenosed midportion straddling a marginal branch; RCA total priximally w/ grade 2 L to R collaterals; renal arteries widely patent; LAD 30% segmental proximal stenosis   CARDIAC CATHETERIZATION N/A 05/14/2016   Procedure: Left Heart Cath and Cors/Grafts Angiography;  Surgeon: [name], MD;  Location: [facility];  Service: Cardiovascular;  Laterality: N/A;   CARDIAC CATHETERIZATION N/A 06/14/2016   Procedure: Coronary Stent Intervention;  Surgeon: [name], MD;  Location: [facility];  Service: Cardiovascular;  Laterality: N/A;   CERVICAL DISC SURGERY  2000   [place]   COLONOSCOPY WITH PROPOFOL N/A 02/20/2018   Procedure: COLONOSCOPY WITH PROPOFOL;  Surgeon: Gregory Craver, MD;  Location: [facility];  Service: Endoscopy;  Laterality: N/A;   CORONARY ANGIOPLASTY WITH STENT PLACEMENT     CORONARY ARTERY BYPASS GRAFT  2009   x5; LIMA to LAd, sequential vein to first diagonal branch, first and second obtuse marginal branches; vein to PDA   ESOPHAGOGASTRODUODENOSCOPY (EGD)  WITH PROPOFOL N/A 02/20/2018   Procedure: ESOPHAGOGASTRODUODENOSCOPY (EGD) WITH PROPOFOL;  Surgeon: Gregory Craver, MD;  Location: [facility];  Service: Endoscopy;  Laterality: N/A;   INSERTION OF MESH N/A 10/29/2017   Procedure: INSERTION OF MESH;  Surgeon: Donnie Mesa, MD;  Location: [facility];  Service: General;  Laterality: N/A;   POLYPECTOMY  02/20/2018   Procedure: POLYPECTOMY;  Surgeon: Gregory Craver, MD;  Location: [facility];  Service: Endoscopy;;   SHOULDER ARTHROSCOPY WITH SUBACROMIAL DECOMPRESSION AND OPEN ROTATOR C Left 09/28/2013   Procedure: LEFT SHOULDER ARTHROSCOPY WITH SUBACROMIAL DECOMPRESSION AND MINI OPEN ROTATOR CUFF REPAIR, OPEN DISTAL CLAVICLE RESECTION AND OPEN BICEP TENDODESIS ;  Surgeon: Augustin Schooling, MD;   Location: [facility];  Service: Orthopedics;  Laterality: Left;   UMBILICAL HERNIA REPAIR N/A 10/29/2017   Procedure: UMBILICAL HERNIA REPAIR;  Surgeon: Donnie Mesa, MD;  Location: [facility];  Service: General;  Laterality: N/A;     Current Meds  Medication Sig   Alirocumab (PRALUENT) 150 MG/ML SOAJ Inject 150 mg into the skin every 14 (fourteen) days.   amLODipine (NORVASC) 10 MG tablet TAKE 1 TABLET BY MOUTH DAILY   amphetamine-dextroamphetamine (ADDERALL) 30 MG tablet Take 30 mg by mouth 3 (three) times daily.   aspirin 325 MG tablet Take 325 mg by mouth as needed.   chlorpheniramine-HYDROcodone (TUSSIONEX PENNKINETIC ER) 10-8 MG/5ML SUER Take 5 mLs by mouth every 12 (twelve) hours as needed for cough.   clopidogrel (PLAVIX) 75 MG tablet TAKE 1 TABLET (75 MG TOTAL) BY MOUTH DAILY.   hydrochlorothiazide (MICROZIDE) 12.5 MG capsule TAKE 1 CAPSULE BY MOUTH DAILY   levothyroxine (SYNTHROID, LEVOTHROID) 75 MCG tablet Take 1 tablet (75 mcg total) by mouth daily.   loratadine (CLARITIN) 10 MG tablet Take 10 mg by mouth 2 (two) times daily as needed for allergies.    LORazepam (ATIVAN) 1 MG tablet Take 1 tablet (1 mg total) by mouth every 8 (eight) hours as needed for anxiety. (Patient taking differently: Take 1 mg by mouth 4 (four) times daily as needed for anxiety. )   metoprolol succinate (TOPROL-XL) 50 MG 24 hr tablet TAKE 1 TABLET (50 MG TOTAL) BY MOUTH DAILY.   pantoprazole (PROTONIX) 40 MG tablet TAKE 1 TABLET (40 MG TOTAL) BY MOUTH DAILY.   tadalafil (ADCIRCA/CIALIS) 20 MG tablet TAKE 1 TABLET (20 MG TOTAL) BY MOUTH DAILY AS NEEDED FOR ERECTILE DYSFUNCTION.   Tetrahydrozoline HCl (VISINE OP) Place 2-3 drops into both eyes 2 (two) times daily as needed (for dry eyes).   TRINTELLIX 10 MG TABS tablet Take 10 mg by mouth daily.      Allergies:   Crestor [rosuvastatin calcium]; Tramadol; Lisinopril; Tricor [fenofibrate]; and Lipitor [atorvastatin calcium]   Social History   Tobacco  Use   Smoking status: Former Smoker    Packs/day: 3.00    Years: 30.00    Pack years: 90.00    Types: Cigarettes    Last attempt to quit: 01/29/2001    Years since quitting: 18.0   Smokeless tobacco: Never Used  Substance Use Topics   Alcohol use: Yes    Alcohol/week: 35.0 standard drinks    Types: 35 Cans of beer per week    Comment: 4-5 day beers   Drug use: No     Family Hx: The patient's family history includes Arthritis in his father; Cancer in his mother; Diabetes in his father; Hypertension in his brother; Stroke in his father.  ROS:   Please see the history of present illness.  All other systems reviewed and are negative.   Prior CV studies:   The following studies were reviewed today:  Carotid Doppler studies  Labs/Other Tests and Data Reviewed:    EKG:  No ECG reviewed.  Recent Labs: 12/04/2018: ALT 29; BUN 15; Hemoglobin 14.5; Platelets 202; Potassium 3.8; Sodium 141 12/09/2018: Creat 1.15 01/20/2019: TSH 3.02   Recent Lipid Panel Lab Results  Component Value Date/Time   CHOL 162 12/04/2018 10:08 AM   CHOL 164 07/03/2018 12:41 PM   TRIG 138 12/04/2018 10:08 AM   HDL 72 12/04/2018 10:08 AM   HDL 93 07/03/2018 12:41 PM   CHOLHDL 2.3 12/04/2018 10:08 AM   LDLCALC 68 12/04/2018 10:08 AM    Wt Readings from Last 3 Encounters:  02/13/19 155 lb (70.3 kg)  01/22/19 158 lb (71.7 kg)  12/09/18 161 lb (73 kg)     Objective:    Vital Signs:  BP (!) 152/85    Pulse 66    Ht 5\' 7"  (1.702 m)    Wt 155 lb (70.3 kg)    BMI 24.28 kg/m    VITAL SIGNS:  reviewed GEN:  no acute distress RESPIRATORY:  normal respiratory effort, symmetric expansion NEURO:  alert and oriented x 3, no obvious focal deficit PSYCH:  normal affect  ASSESSMENT & PLAN:    1. Coronary artery disease- history of CAD status post CABG x5 by Dr. Roxan [name] 06/16/2008.  He had a free LIMA to his LAD, sequential vein to the first diagonal branch, first and second obtuse marginal branches  as well as a vein to the PDA.  I performed cardiac catheterization on him 06/14/2016 after a Myoview performed 03/29/2016 showed a large reversible lateral wall perfusion defect.  This revealed severe native and graft disease with an occluded RCA, occluded vein to the RCA and circumflex and a patent LIMA to the LAD.  After discussion with Dr. Roxan [name] it was decided to proceed with native circumflex intervention with PCI and drug-eluting stenting.  I performed this 06/14/2016 successfully which resulted in marked improvement in his symptoms. 2. Essential hypertension- history of essential hypertension with blood pressure measured at 152/85 with a pulse of 66.  He is on amlodipine, hydrochlorothiazide and metoprolol 3. Hyperlipidemia-history of hyperlipidemia intolerant to statin therapy on Praluent with lipid profile performed 12/04/2018 revealing total cholesterol 162, LDL 68 and HDL 72. 4. Carotid artery disease- history of moderate bilateral ICA stenosis which has remained stable by recent duplex ultrasound 05/07/2018.  This will be repeated on an annual basis.  COVID-19 Education: The signs and symptoms of COVID-19 were discussed with the patient and how to seek care for testing (follow up with PCP or arrange E-visit).  The importance of social distancing was discussed today.  Time:   Today, I have spent 9 minutes with the patient with telehealth technology discussing the above problems.     Medication Adjustments/Labs and Tests Ordered: Current medicines are reviewed at length with the patient today.  Concerns regarding medicines are outlined above.   Tests Ordered: No orders of the defined types were placed in this encounter.   Medication Changes: No orders of the defined types were placed in this encounter.   Disposition:  Follow up in 6 month(s)  Signed, Gregory Burow, MD  02/13/2019 2:55 PM    [place]

## 2019-02-26 ENCOUNTER — Other Ambulatory Visit: Payer: Self-pay | Admitting: Cardiovascular Disease

## 2019-02-26 DIAGNOSIS — R972 Elevated prostate specific antigen [PSA]: Secondary | ICD-10-CM | POA: Diagnosis not present

## 2019-02-26 MED FILL — METOPROLOL SUCCINATE ER 50: 50 | 90 days supply | Qty: 90 | Fill #0

## 2019-03-13 MED FILL — PRALUENT 150 MG/ML PEN: 150 | 28 days supply | Qty: 2 | Fill #3

## 2019-03-13 MED FILL — LEVOTHYROXINE 75 MCG TABLET: 75 | 30 days supply | Qty: 30 | Fill #0

## 2019-03-13 MED FILL — TADALAFIL 20 MG TABS: 20 | 30 days supply | Qty: 6 | Fill #6

## 2019-03-18 MED FILL — DEXTROAMPH TB 30MG NSTR 100: 30 | 90 days supply | Qty: 270 | Fill #0

## 2019-03-25 ENCOUNTER — Telehealth: Payer: Self-pay | Admitting: Internal Medicine

## 2019-03-25 ENCOUNTER — Encounter: Payer: Self-pay | Admitting: Internal Medicine

## 2019-03-25 DIAGNOSIS — R112 Nausea with vomiting, unspecified: Secondary | ICD-10-CM

## 2019-03-25 DIAGNOSIS — R05 Cough: Secondary | ICD-10-CM

## 2019-03-25 MED ORDER — HYDROCOD POLST-CPM POLST ER 10-8 MG/5ML PO SUER: 5.0000 mL | Freq: Two times a day (BID) | ORAL | 0 refills | Status: DC | PRN

## 2019-03-25 NOTE — Telephone Encounter (Signed)
Patient called today saying he was having cough with thick white sputum production followed by nausea and vomiting. Denies chest pain or hemoptysis. Has been building a fence in the hot weather. He had similar complaint when seen in April treated with antibiotics, prednisone and cough medication. Has not called back until today. Patient would like to see Dr. Halina Maidens for evaluation of possible COPD. Will place referral. In the meantime, he would like refill on Tussionex.

## 2019-03-26 MED FILL — HYDROCODONE-CHLORPHEN ER SU: 10-8 | 14 days supply | Qty: 140 | Fill #0

## 2019-04-01 DIAGNOSIS — C61 Malignant neoplasm of prostate: Secondary | ICD-10-CM

## 2019-04-01 HISTORY — DX: Malignant neoplasm of prostate: C61

## 2019-04-02 ENCOUNTER — Other Ambulatory Visit: Payer: Self-pay | Admitting: Urology

## 2019-04-02 DIAGNOSIS — R972 Elevated prostate specific antigen [PSA]: Secondary | ICD-10-CM

## 2019-04-13 ENCOUNTER — Other Ambulatory Visit: Payer: Self-pay | Admitting: Internal Medicine

## 2019-04-13 MED FILL — PRALUENT 150 MG/ML PEN: 150 | 28 days supply | Qty: 2 | Fill #4

## 2019-04-13 MED FILL — LEVOTHYROXINE 75 MCG TABLET: 75 | 30 days supply | Qty: 30 | Fill #0

## 2019-04-13 MED FILL — TADALAFIL 20 MG TABS: 20 | 30 days supply | Qty: 6 | Fill #7

## 2019-04-22 ENCOUNTER — Other Ambulatory Visit: Payer: Self-pay | Admitting: Cardiovascular Disease

## 2019-04-22 ENCOUNTER — Other Ambulatory Visit: Payer: Self-pay | Admitting: Internal Medicine

## 2019-04-22 DIAGNOSIS — I10 Essential (primary) hypertension: Secondary | ICD-10-CM

## 2019-04-22 MED FILL — AMLODIPINE BESYLATE 10 MG T: 10 | 90 days supply | Qty: 90 | Fill #0

## 2019-04-22 MED FILL — HYDROCHLOROTHIAZIDE 12.5 MG: 12.5 | 90 days supply | Qty: 90 | Fill #0

## 2019-04-22 MED FILL — CLOPIDOGREL 75 MG TABLET: 75 | 90 days supply | Qty: 90 | Fill #1

## 2019-04-22 MED FILL — PANTOPRAZOLE SOD DR 40 MG T: 40 | 90 days supply | Qty: 90 | Fill #0

## 2019-04-23 MED FILL — TRINTELLIX 5 MG TABLET: 5 | 30 days supply | Qty: 90 | Fill #2

## 2019-04-28 ENCOUNTER — Other Ambulatory Visit: Payer: Self-pay

## 2019-04-28 ENCOUNTER — Other Ambulatory Visit (HOSPITAL_COMMUNITY): Payer: Self-pay | Admitting: Cardiovascular Disease

## 2019-04-28 ENCOUNTER — Ambulatory Visit (HOSPITAL_COMMUNITY)
Admission: RE | Admit: 2019-04-28 | Discharge: 2019-04-28 | Disposition: A | Discharge status: Home / Self Care | Payer: 59 | Source: Ambulatory Visit | Attending: Cardiology | Admitting: Cardiology

## 2019-04-28 DIAGNOSIS — I6523 Occlusion and stenosis of bilateral carotid arteries: Secondary | ICD-10-CM | POA: Diagnosis not present

## 2019-05-01 ENCOUNTER — Ambulatory Visit
Admission: RE | Admit: 2019-05-01 | Discharge: 2019-05-01 | Disposition: A | Discharge status: Home / Self Care | Payer: 59 | Source: Ambulatory Visit | Attending: Urology | Admitting: Urology

## 2019-05-01 ENCOUNTER — Other Ambulatory Visit: Payer: Self-pay

## 2019-05-01 DIAGNOSIS — R972 Elevated prostate specific antigen [PSA]: Secondary | ICD-10-CM | POA: Diagnosis not present

## 2019-05-01 MED ORDER — GADOBENATE DIMEGLUMINE 529 MG/ML IV SOLN
15.0000 mL | Freq: Once | INTRAVENOUS | Status: AC | PRN
  Administered 2019-05-01: 15 mL via INTRAVENOUS

## 2019-05-04 ENCOUNTER — Telehealth: Payer: Self-pay | Admitting: Internal Medicine

## 2019-05-04 ENCOUNTER — Encounter: Payer: Self-pay | Admitting: Internal Medicine

## 2019-05-04 ENCOUNTER — Other Ambulatory Visit: Payer: Self-pay

## 2019-05-04 ENCOUNTER — Ambulatory Visit (INDEPENDENT_AMBULATORY_CARE_PROVIDER_SITE_OTHER): Payer: 59 | Admitting: Internal Medicine

## 2019-05-04 VITALS — BP 150/100 | HR 92 | Temp 98.2°F | Ht 67.0 in | Wt 154.0 lb

## 2019-05-04 DIAGNOSIS — R972 Elevated prostate specific antigen [PSA]: Secondary | ICD-10-CM | POA: Diagnosis not present

## 2019-05-04 DIAGNOSIS — R109 Unspecified abdominal pain: Secondary | ICD-10-CM

## 2019-05-04 DIAGNOSIS — M5416 Radiculopathy, lumbar region: Secondary | ICD-10-CM

## 2019-05-04 LAB — POCT URINALYSIS DIPSTICK
Appearance: NEGATIVE
Bilirubin, UA: POSITIVE
Blood, UA: NEGATIVE
Glucose, UA: NEGATIVE
Ketones, UA: NEGATIVE
Leukocytes, UA: NEGATIVE
Nitrite, UA: NEGATIVE
Protein, UA: POSITIVE — AB
Spec Grav, UA: 1.015 (ref 1.010–1.025)
Urobilinogen, UA: 0.2 E.U./dL
pH, UA: 6 (ref 5.0–8.0)

## 2019-05-04 MED ORDER — HYDROCODONE-ACETAMINOPHEN 10-325 MG PO TABS: 1.0000 | ORAL_TABLET | Freq: Three times a day (TID) | ORAL | 0 refills | Status: AC | PRN

## 2019-05-04 MED ORDER — PREDNISONE 10 MG PO TABS: ORAL_TABLET | ORAL | 0 refills | Status: DC

## 2019-05-04 MED FILL — predniSONE 10 MG TABS: 10 | 6 days supply | Qty: 21 | Fill #0

## 2019-05-04 MED FILL — HYDROCODON-APAP 10-325: 10-325 | 5 days supply | Qty: 15 | Fill #0

## 2019-05-04 NOTE — Telephone Encounter (Signed)
OK 

## 2019-05-04 NOTE — Telephone Encounter (Signed)
[name] [phone]  [name] called to say that yesterday he started having Postitier back pain running down to his testicle, having lot of difficulty moving. Wants to come in today. He is supposed to leave for vacation tomorrow.

## 2019-05-04 NOTE — Progress Notes (Signed)
   Subjective:    Patient ID: [name], male    DOB: Mar 25, 1951, 68 y.o.   MRN: [id number]  HPI 68 year old Male with right sided back pain radiating to groin. Found to have elevated PSA and is being evaluated by Urologist. Needs to go out of town. Worried about back with travel and being out of town. No weakness in LEs. Does a fair amount of physical labor about his home.  Dipstick UA shows no evidence of infection or blood.  However protein present and bilirubin.  Liver functions checked in March were normal.  Bilirubin could be an error.  History of hypothyroidism, depression, stroke, history of CABG, squamous cell carcinoma of the tongue, allergic rhinitis,cervical disc disease.  History of left shoulder arthroscopy with subacromial decompression and rotator cuff repair 2014.  History of 15 mm upper extremity blood pressure differential related to subclavian stenosis.  History of left Horner syndrome and migraine headaches.  History of BPH and hypertension.  History of hyperlipidemia but is statin intolerant.  Review of Systems see above      Objective:   Physical Exam  He has good muscle strength in the lower extremities.  History of peripheral neuropathy with some numbness in his feet.  Range of motion in the trunk is only fair due to pain with bending forward and backward.      Assessment & Plan:  Right back pain with radiculopathy  Plan: He will take prednisone in tapering course starting with 6 tablets and decreasing by 1 tablet daily over 6 days.  Norco 10/325 #15 tablets take 1 p.o. every 8 hours as needed pain.  May need to go to physical therapy if not improving.

## 2019-05-04 NOTE — Telephone Encounter (Signed)
Scheduled appointment

## 2019-05-14 ENCOUNTER — Other Ambulatory Visit: Payer: Self-pay | Admitting: Internal Medicine

## 2019-05-14 ENCOUNTER — Telehealth: Payer: Self-pay

## 2019-05-14 MED ORDER — PREDNISONE 10 MG PO TABS: ORAL_TABLET | ORAL | 0 refills | Status: DC

## 2019-05-14 MED FILL — PRALUENT 150 MG/ML PEN: 150 | 28 days supply | Qty: 2 | Fill #5

## 2019-05-14 MED FILL — LEVOTHYROXINE 75 MCG TABLET: 75 | 30 days supply | Qty: 30 | Fill #0

## 2019-05-14 MED FILL — predniSONE 10 MG TABS: 10 | 6 days supply | Qty: 21 | Fill #0

## 2019-05-14 NOTE — Telephone Encounter (Signed)
He said yes.

## 2019-05-14 NOTE — Telephone Encounter (Signed)
Will arrange.

## 2019-05-14 NOTE — Telephone Encounter (Signed)
Patient called sates the back pain never went away and it's back and the pain is radiating down to his right leg. He wants to know if you can refill his prednisone.

## 2019-05-14 NOTE — Telephone Encounter (Signed)
Will he go to physical therapy?

## 2019-05-14 NOTE — Telephone Encounter (Signed)
E-scribed per Dr. Renold Genta.

## 2019-05-16 DIAGNOSIS — M5416 Radiculopathy, lumbar region: Secondary | ICD-10-CM | POA: Insufficient documentation

## 2019-05-16 DIAGNOSIS — R972 Elevated prostate specific antigen [PSA]: Secondary | ICD-10-CM | POA: Insufficient documentation

## 2019-05-16 NOTE — Patient Instructions (Signed)
Take Norco 10/325 sparingly every 8 hours as needed pain with food.  Take prednisone in tapering course as directed

## 2019-05-17 ENCOUNTER — Emergency Department (HOSPITAL_BASED_OUTPATIENT_CLINIC_OR_DEPARTMENT_OTHER)
Admission: EM | Admit: 2019-05-17 | Discharge: 2019-05-17 | Disposition: A | Discharge status: Home / Self Care | Payer: 59 | Attending: Emergency Medicine | Admitting: Emergency Medicine

## 2019-05-17 ENCOUNTER — Other Ambulatory Visit: Payer: Self-pay

## 2019-05-17 ENCOUNTER — Telehealth: Payer: 59 | Admitting: Physician Assistant

## 2019-05-17 ENCOUNTER — Encounter (HOSPITAL_BASED_OUTPATIENT_CLINIC_OR_DEPARTMENT_OTHER): Payer: Self-pay | Admitting: Emergency Medicine

## 2019-05-17 DIAGNOSIS — M5441 Lumbago with sciatica, right side: Secondary | ICD-10-CM | POA: Diagnosis not present

## 2019-05-17 DIAGNOSIS — E785 Hyperlipidemia, unspecified: Secondary | ICD-10-CM | POA: Insufficient documentation

## 2019-05-17 DIAGNOSIS — I2510 Atherosclerotic heart disease of native coronary artery without angina pectoris: Secondary | ICD-10-CM | POA: Diagnosis not present

## 2019-05-17 DIAGNOSIS — Z79899 Other long term (current) drug therapy: Secondary | ICD-10-CM | POA: Diagnosis not present

## 2019-05-17 DIAGNOSIS — I10 Essential (primary) hypertension: Secondary | ICD-10-CM | POA: Diagnosis not present

## 2019-05-17 DIAGNOSIS — M5431 Sciatica, right side: Secondary | ICD-10-CM

## 2019-05-17 DIAGNOSIS — J441 Chronic obstructive pulmonary disease with (acute) exacerbation: Secondary | ICD-10-CM | POA: Diagnosis not present

## 2019-05-17 DIAGNOSIS — Z87891 Personal history of nicotine dependence: Secondary | ICD-10-CM | POA: Diagnosis not present

## 2019-05-17 MED ORDER — LIDOCAINE 5 % EX PTCH: 1.0000 | MEDICATED_PATCH | CUTANEOUS | 0 refills | Status: DC

## 2019-05-17 MED ORDER — OXYCODONE-ACETAMINOPHEN 5-325 MG PO TABS
1.0000 | ORAL_TABLET | Freq: Once | ORAL | Status: AC
  Administered 2019-05-17: 1 via ORAL
  Filled 2019-05-17: qty 1

## 2019-05-17 MED ORDER — OXYCODONE-ACETAMINOPHEN 5-325 MG PO TABS: 1.0000 | ORAL_TABLET | ORAL | 0 refills | Status: DC | PRN

## 2019-05-17 NOTE — ED Notes (Signed)
Pt also c/o right knee pain, requests ice pack

## 2019-05-17 NOTE — Progress Notes (Signed)
Based on what you shared with me, I feel your condition warrants further evaluation and I recommend that you be seen for a face to face office visit.  It appears that you were treated with prednisone and norco for back pain by your primary care provider on 05/04/19 and then given a refill for prednisone and referral to physical therapy on 05/14/19. Considering your back pain has not responded to this treatment, I recommend a face to face evaluation as you likely will need imaging at this time for further management.    NOTE: If you entered your credit card information for this eVisit, you will not be charged. You may see a "hold" on your card for the $35 but that hold will drop off and you will not have a charge processed.  If you are having a true medical emergency please call 911.       The following sites will take your insurance:  . [facility]    [phone]                  Get Driving Directions  [address], [place] [address] . 10 am to 8 pm Monday-Friday . 12 pm to 8 pm Saturday-Sunday   . [facility] [place]                  Get Driving Directions  [address], [place], [place] [address] . 8 am to 8 pm Monday-Friday . 9 am to 6 pm Saturday . 11 am to 6 pm Sunday   . [facility] [place]                  Get Driving Directions   [address] [place], [place] [address] . 8 am to 8 pm Monday-Friday . 8 am to 4 pm Saturday-Sunday    . [facility] [place]                    Get Driving Directions  [phone]  [address], [place], [place] [address]  . Monday-Friday, 12 PM to 6 PM    Your e-visit answers were reviewed by a board certified advanced clinical practitioner to complete your personal care plan.  Thank you for using e-Visits.  I have spent 5 minutes in review of e-visit questionnaire,  review and updating patient chart, medical decision making and response to patient.    Tenna Delaine, PA-C

## 2019-05-17 NOTE — ED Triage Notes (Signed)
Reports being seen by PCP a couple of weeks ago and has been treated for sciatica.  Reports its on the right side going from lower right back down to knee and radiates into the testicle.  Reports pain is getting worse with treatment.  Was on Norco and prednisone.  Now on second round of prednisone.  Ran out of Norco the first week.

## 2019-05-17 NOTE — ED Notes (Addendum)
Pt denies otc medication pta. Pt reports prostate biopsy scheduled Wed, and was informed not to take medications prior to procedure. Pt tearful while assessing.

## 2019-05-17 NOTE — Discharge Instructions (Signed)
Your history and exams are consistent with worsening of your sciatic pain.  We had a long shared decision-making conversation and elected to not pursue further imaging today but instead focus on the pain and nerve pain management.  Please follow-up with your back doctor to continue further work-up and management.  If any symptoms change or worsen, please return to the nearest emergency department.

## 2019-05-17 NOTE — ED Provider Notes (Signed)
[facility] EMERGENCY DEPARTMENT Provider Note   CSN: [id number] Arrival date & time: 05/17/19  1752     History   Chief Complaint Chief Complaint  Patient presents with  . Sciatica    HPI TYLEEK [name] is a 68 y.o. male.     The history is provided by the patient, the spouse and medical records. No language interpreter was used.  Back Pain Location:  Sacro-iliac joint and lumbar spine Quality:  Aching, stabbing and shooting Radiates to:  R posterior upper leg Pain severity:  Severe Pain is:  Unable to specify Onset quality:  Gradual Timing:  Constant Progression:  Unchanged Chronicity:  Recurrent Worsened by:  Nothing Ineffective treatments:  None tried Associated symptoms: leg pain   Associated symptoms: no abdominal pain, no bladder incontinence, no bowel incontinence, no chest pain, no fever, no numbness, no paresthesias, no pelvic pain, no perianal numbness, no tingling and no weakness     Past Medical History:  Diagnosis Date  . Anxiety   . Arthritis    "NECK, HANDS, KNEES" (06/14/2016)  . CAD (coronary artery disease)   . Carotid artery stenosis   . Complication of anesthesia    Reports that he had trouble swallowing post anesthesia- CABG  . COPD (chronic obstructive pulmonary disease) ([place]) 12/11/2011   r/s mv - EF 72%; exercise capacity 13 METS; no exercised induced ischemic EKG changes  . Depression, major   . GERD (gastroesophageal reflux disease)   . HTN (hypertension), benign 04/16/2008   echo - EF >55%; no regional wall or valvular abnormalities  . Hyperlipidemia    statin intolerant  . Hypothyroidism   . Migraine    "crippling headaches as a kid; silent migraines now, no pain, couple times/week" (06/14/2016)  . S/P angioplasty with stent 06/14/16 PCI & DES of high grade AV groove LCX and 1st OM  06/15/2016  . Squamous cell cancer of tongue ([place]) ~ 2002   "35 radiation treatments at [facility]  . Subclavian steal syndrome 06/17/2012   carotid doppler - R systolic brachial pressure 160mmHg, L 173mmHg; R subclavian artery - proxmial obsstruction w/ abnormal monophasic waveforms, R ECA known occlusive disease; L ECA narrowing w/ 70-99% diameter reductiona  . TIA (transient ischemic attack) 2005  . Viral hepatitis 1970s   "non specific"    Patient Active Problem List   Diagnosis Date Noted  . Right lumbar radiculopathy 05/16/2019  . Elevated PSA 05/16/2019  . S/P angioplasty with stent 06/14/16 PCI & DES of high grade AV groove LCX and 1st OM  06/15/2016  . CAD (coronary artery disease) 06/14/2016  . Abnormal stress test   . Carotid artery disease ([place]) 05/20/2013  . Hypothyroidism 03/29/2012  . Erectile dysfunction 07/02/2011  . Anxiety 07/02/2011  . Back pain 07/02/2011  . Cervical disc disease 07/02/2011  . Coronary artery disease 04/29/2011  . Squamous cell carcinoma of tongue ([place]) 04/29/2011  . Hypertension 04/29/2011  . Depression 04/29/2011  . GE reflux 04/29/2011  . Internal carotid artery stenosis 04/29/2011  . Ptosis of eyelid, left 04/29/2011  . Hyperlipidemia 04/29/2011  . History of ischemic vertebrobasilar artery thalamic stroke 04/29/2011  . Attention deficit disorder 04/29/2011  . Degenerative joint disease of cervical spine 04/29/2011    Past Surgical History:  Procedure Laterality Date  . BACK SURGERY    . BIOPSY  02/20/2018   Procedure: BIOPSY;  Surgeon: Juanita Craver, MD;  Location: [facility];  Service: Endoscopy;;  . BIOPSY TONGUE  ~ 2002   "  base of tongue and uvula"  . CARDIAC CATHETERIZATION  04/26/2008   L circumflex 99% stenosed midportion straddling a marginal branch; RCA total priximally w/ grade 2 L to R collaterals; renal arteries widely patent; LAD 30% segmental proximal stenosis  . CARDIAC CATHETERIZATION N/A 05/14/2016   Procedure: Left Heart Cath and Cors/Grafts Angiography;  Surgeon: [name], MD;  Location: [facility];  Service: Cardiovascular;  Laterality:  N/A;  . CARDIAC CATHETERIZATION N/A 06/14/2016   Procedure: Coronary Stent Intervention;  Surgeon: [name], MD;  Location: [place] [facility];  Service: Cardiovascular;  Laterality: N/A;  . [place]   [place]  . COLONOSCOPY WITH PROPOFOL N/A 02/20/2018   Procedure: COLONOSCOPY WITH PROPOFOL;  Surgeon: Juanita Craver, MD;  Location: [facility];  Service: Endoscopy;  Laterality: N/A;  . CORONARY ANGIOPLASTY WITH STENT PLACEMENT    . CORONARY ARTERY BYPASS GRAFT  2009   x5; LIMA to LAd, sequential vein to first diagonal branch, first and second obtuse marginal branches; vein to PDA  . ESOPHAGOGASTRODUODENOSCOPY (EGD) WITH PROPOFOL N/A 02/20/2018   Procedure: ESOPHAGOGASTRODUODENOSCOPY (EGD) WITH PROPOFOL;  Surgeon: Juanita Craver, MD;  Location: [facility];  Service: Endoscopy;  Laterality: N/A;  . INSERTION OF MESH N/A 10/29/2017   Procedure: INSERTION OF MESH;  Surgeon: Donnie Mesa, MD;  Location: [facility];  Service: General;  Laterality: N/A;  . POLYPECTOMY  02/20/2018   Procedure: POLYPECTOMY;  Surgeon: Juanita Craver, MD;  Location: [facility];  Service: Endoscopy;;  . SHOULDER ARTHROSCOPY WITH SUBACROMIAL DECOMPRESSION AND OPEN ROTATOR C Left 09/28/2013   Procedure: LEFT SHOULDER ARTHROSCOPY WITH SUBACROMIAL DECOMPRESSION AND MINI OPEN ROTATOR CUFF REPAIR, OPEN DISTAL CLAVICLE RESECTION AND OPEN BICEP TENDODESIS ;  Surgeon: Augustin Schooling, MD;  Location: [facility];  Service: Orthopedics;  Laterality: Left;  . UMBILICAL HERNIA REPAIR N/A 10/29/2017   Procedure: UMBILICAL HERNIA REPAIR;  Surgeon: Donnie Mesa, MD;  Location: [facility];  Service: General;  Laterality: N/A;        Home Medications    Prior to Admission medications   Medication Sig Start Date End Date Taking? Authorizing Provider  Alirocumab (PRALUENT) 150 MG/ML SOAJ Inject 150 mg into the skin every 14 (fourteen) days. 11/24/18   [name], MD  amLODipine (NORVASC) 10 MG tablet TAKE 1 TABLET BY  MOUTH DAILY 04/22/19   [name], MD  amphetamine-dextroamphetamine (ADDERALL) 30 MG tablet Take 30 mg by mouth 3 (three) times daily.    [provider]  aspirin 325 MG tablet Take 325 mg by mouth as needed.    [provider]  chlorpheniramine-HYDROcodone (TUSSIONEX PENNKINETIC ER) 10-8 MG/5ML SUER Take 5 mLs by mouth every 12 (twelve) hours as needed for cough. 03/25/19   [name], MD  clopidogrel (PLAVIX) 75 MG tablet TAKE 1 TABLET (75 MG TOTAL) BY MOUTH DAILY. 01/06/19   [name], MD  hydrochlorothiazide (MICROZIDE) 12.5 MG capsule TAKE 1 CAPSULE BY MOUTH DAILY 04/22/19   [name], MD  levothyroxine (SYNTHROID) 75 MCG tablet TAKE 1 TABLET (75 MCG TOTAL) BY MOUTH DAILY. 05/14/19   [name], MD  LORazepam (ATIVAN) 1 MG tablet Take 1 tablet (1 mg total) by mouth every 8 (eight) hours as needed for anxiety. Patient taking differently: Take 1 mg by mouth 4 (four) times daily as needed for anxiety.  01/03/17   [name], MD  metoprolol succinate (TOPROL-XL) 50 MG 24 hr tablet TAKE 1 TABLET (50  MG TOTAL) BY MOUTH DAILY. 02/26/19   [name], MD  pantoprazole (PROTONIX) 40 MG tablet TAKE 1 TABLET (40 MG TOTAL) BY MOUTH DAILY. 04/22/19   [name], MD  predniSONE (DELTASONE) 10 MG tablet Take in tapering course as directed 6-5-4-3-2-1 05/14/19   [name], MD  tadalafil (ADCIRCA/CIALIS) 20 MG tablet TAKE 1 TABLET (20 MG TOTAL) BY MOUTH DAILY AS NEEDED FOR ERECTILE DYSFUNCTION. 09/02/18   [name], MD  Tetrahydrozoline HCl (VISINE OP) Place 2-3 drops into both eyes 2 (two) times daily as needed (for dry eyes).    [provider]  TRINTELLIX 10 MG TABS tablet Take 10 mg by mouth daily.  11/11/15   [provider]    Family History Family History  Problem Relation Age of Onset  . Cancer Mother        ovarian  . Stroke Father   . Diabetes Father   . Arthritis Father   . Hypertension Brother     Social History  Social History   Tobacco Use  . Smoking status: Former Smoker    Packs/day: 3.00    Years: 30.00    Pack years: 90.00    Types: Cigarettes    Quit date: 01/29/2001    Years since quitting: 18.3  . Smokeless tobacco: Never Used  Substance Use Topics  . Alcohol use: Yes    Alcohol/week: 35.0 standard drinks    Types: 35 Cans of beer per week    Comment: 4-5 day beers  . Drug use: No     Allergies   Crestor [rosuvastatin calcium], Tramadol, Lisinopril, Tricor [fenofibrate], and Lipitor [atorvastatin calcium]   Review of Systems Review of Systems  Constitutional: Negative for chills, diaphoresis, fatigue and fever.  HENT: Negative for congestion.   Respiratory: Negative for cough, chest tightness, shortness of breath, wheezing and stridor.   Cardiovascular: Negative for chest pain, palpitations and leg swelling.  Gastrointestinal: Negative for abdominal pain, bowel incontinence, constipation, diarrhea, nausea and vomiting.  Genitourinary: Negative for bladder incontinence and pelvic pain.  Musculoskeletal: Positive for back pain. Negative for neck pain and neck stiffness.  Neurological: Negative for tingling, seizures, weakness, numbness and paresthesias.  Psychiatric/Behavioral: Negative for agitation.  All other systems reviewed and are negative.    Physical Exam Updated Vital Signs BP (!) 143/94 (BP Location: Right Arm)   Pulse 78   Temp 97.7 F (36.5 C) (Oral)   Resp 18   Ht 5\' 7"  (1.702 m)   Wt 66.7 kg   SpO2 100%   BMI 23.02 kg/m   Physical Exam Vitals signs and nursing note reviewed.  Constitutional:      Appearance: He is well-developed.  HENT:     Head: Normocephalic and atraumatic.  Eyes:     Conjunctiva/sclera: Conjunctivae normal.  Neck:     Musculoskeletal: Neck supple.  Cardiovascular:     Rate and Rhythm: Normal rate and regular rhythm.     Heart sounds: No murmur.  Pulmonary:     Effort: Pulmonary effort is normal. No respiratory distress.      Breath sounds: Normal breath sounds. No wheezing, rhonchi or rales.  Chest:     Chest wall: No tenderness.  Abdominal:     General: Abdomen is flat.     Palpations: Abdomen is soft.     Tenderness: There is no abdominal tenderness. There is no right CVA tenderness or left CVA tenderness.  Musculoskeletal:        General:  Tenderness present.     Right knee: No tenderness found.     Lumbar back: He exhibits tenderness and pain.       Back:     Right lower leg: No edema.     Left lower leg: No edema.     Comments: Normal sensation and strength in both lower extremities.  Normal pulses in lower extremities.  Skin:    General: Skin is warm and dry.     Capillary Refill: Capillary refill takes less than 2 seconds.     Findings: No erythema.  Neurological:     General: No focal deficit present.     Mental Status: He is alert and oriented to person, place, and time.     Sensory: No sensory deficit.     Motor: No weakness.     Coordination: Coordination normal.     Gait: Gait normal.      ED Treatments / Results  Labs (all labs ordered are listed, but only abnormal results are displayed) Labs Reviewed - No data to display  EKG None  Radiology No results found.  Procedures Procedures (including critical care time)  Medications Ordered in ED Medications  oxyCODONE-acetaminophen (PERCOCET/ROXICET) 5-325 MG per tablet 1 tablet (1 tablet Oral Given 05/17/19 2012)     Initial Impression / Assessment and Plan / ED Course  I have reviewed the triage vital signs and the nursing notes.  Pertinent labs & imaging results that were available during my care of the patient were reviewed by me and considered in my medical decision making (see chart for details).        [name] is a 68 y.o. male with a past medical history significant for hypertension, CAD, GERD, hyperlipidemia, hypothyroidism, and prostate abnormality scheduled for prostate biopsy in 3 days who presents  with back pain.  Patient reports that he has known low back and right sciatic pain for which she is currently on steroids and was on pain medication.  He reports that he ran out of the medication and has not been taking any medications in preparation for the prostate biopsy.  He reports that he has had no new injuries but the pain has worsened.  He reports he is having some knee pain on the right side with no injury.  He describes the pain is moderate to severe but denies any loss of bowel or bladder control.  He denies any numbness, tingling, or weakness of the legs.  No fevers or chills or other concerning symptoms.  On exam, patient has tenderness in his right SI and right lumbar spine.  Normal sensation and strength in legs.  No point tenderness on his right knee just diffuse aching.  No other complaints or abnormalities on exam.  Patient was offered imaging of his knee given the pain however he did not want this.  I suspect patient's knee pain is due to his gait change with the worsened back pain over the last week.  Patient is warranted and getting advanced imaging done by his spine team and PCP then today.  Patient is agreeable to pain medication and Lidoderm patch and follow-up with his PCP for further imaging.  Patient reports he will reschedule his biopsy and take these medications.  He will continue his steroids for radicular pain.  He understood follow-up instructions and return precautions.  He had no other questions or concerns and was discharged in good condition.   Final Clinical Impressions(s) / ED Diagnoses   Final  diagnoses:  Sciatic pain, right  Acute right-sided low back pain with right-sided sciatica    ED Discharge Orders         Ordered    oxyCODONE-acetaminophen (PERCOCET/ROXICET) 5-325 MG tablet  Every 4 hours PRN     05/17/19 2011    lidocaine (LIDODERM) 5 %  Every 24 hours     05/17/19 2011         Clinical Impression: 1. Sciatic pain, right   2. Acute  right-sided low back pain with right-sided sciatica     Disposition: Discharge  Condition: Good  I have discussed the results, Dx and Tx plan with the pt(& family if present). He/she/they expressed understanding and agree(s) with the plan. Discharge instructions discussed at great length. Strict return precautions discussed and pt &/or family have verbalized understanding of the instructions. No further questions at time of discharge.    Discharge Medication List as of 05/17/2019  8:12 PM    START taking these medications   Details  lidocaine (LIDODERM) 5 % Place 1 patch onto the skin daily. Remove & Discard patch within 12 hours or as directed by MD, Starting Sun 05/17/2019, Normal    oxyCODONE-acetaminophen (PERCOCET/ROXICET) 5-325 MG tablet Take 1 tablet by mouth every 4 (four) hours as needed for severe pain., Starting Sun 05/17/2019, Normal        Follow Up: your back doctor        Tegeler, Gwenyth Allegra, MD 05/18/19 [phone]

## 2019-05-18 ENCOUNTER — Telehealth: Payer: Self-pay | Admitting: Internal Medicine

## 2019-05-18 ENCOUNTER — Encounter: Payer: Self-pay | Admitting: Internal Medicine

## 2019-05-18 NOTE — Telephone Encounter (Signed)
Called [facility] got an appointment with Brad for 05/19/19 @ 8:45, Called and left VM for patient to call.

## 2019-05-18 NOTE — Telephone Encounter (Signed)
Pt had called Friday with persistent back pain. Prednisone was refilled. He presented to ED yesterday with severe back pain. Was given pain med and lidoderm patch. He is cancelling prostate biopsy scheduled for later in the week.  His orthopedist is Dr. Veverly Fells. Will see if he can be seen at [facility] as soon as possible.

## 2019-05-19 DIAGNOSIS — M545 Low back pain: Secondary | ICD-10-CM | POA: Diagnosis not present

## 2019-05-20 MED FILL — OXYCODONE-ACETAMINOPHEN 5-3: 5-325 | 15 days supply | Qty: 60 | Fill #0

## 2019-05-21 MED FILL — LORAZEPAM 1 MG TABS: 1 | 90 days supply | Qty: 360 | Fill #0

## 2019-05-21 MED FILL — TRINTELLIX 5 MG TABLET: 5 | 30 days supply | Qty: 90 | Fill #3

## 2019-05-21 MED FILL — predniSONE 10 MG TABS: 10 | 6 days supply | Qty: 21 | Fill #0

## 2019-05-26 DIAGNOSIS — M545 Low back pain: Secondary | ICD-10-CM | POA: Diagnosis not present

## 2019-05-26 MED FILL — HYDROmorphone HCL 2 MG TABS: 2 | 5 days supply | Qty: 30 | Fill #0

## 2019-06-01 DIAGNOSIS — M545 Low back pain: Secondary | ICD-10-CM | POA: Diagnosis not present

## 2019-06-01 DIAGNOSIS — M5116 Intervertebral disc disorders with radiculopathy, lumbar region: Secondary | ICD-10-CM | POA: Diagnosis not present

## 2019-06-01 MED FILL — HYDROmorphone HCL 2 MG TABS: 2 | 5 days supply | Qty: 30 | Fill #0

## 2019-06-01 NOTE — Telephone Encounter (Signed)
We require office visit for surgical clearance. This form did not come through. Usually they will fax it to [place]. Dr. Gwenlyn Found needs to clear you from a cardiac standpoint so you need to get in touch with him as well. When is surgery scheduled?

## 2019-06-02 ENCOUNTER — Telehealth: Payer: Self-pay | Admitting: Internal Medicine

## 2019-06-02 NOTE — Telephone Encounter (Signed)
LVM to CB and schedule an appointment for surgery clearence

## 2019-06-03 ENCOUNTER — Telehealth: Payer: Self-pay | Admitting: Cardiovascular Disease

## 2019-06-03 ENCOUNTER — Telehealth: Payer: Self-pay | Admitting: *Deleted

## 2019-06-03 ENCOUNTER — Ambulatory Visit: Payer: Self-pay | Admitting: Orthopedic Surgery

## 2019-06-03 MED FILL — GABAPENTIN 300 MG CAPSULE: 300 | 15 days supply | Qty: 45 | Fill #0

## 2019-06-03 NOTE — Progress Notes (Unsigned)
Called and rescheduled office visit for surgery clearance and ask to have surgeon to fax clearance letter prior to appointment.

## 2019-06-03 NOTE — Telephone Encounter (Signed)
   Primary Cardiologist: Quay Burow, MD  Chart reviewed as part of pre-operative protocol coverage. Patient was contacted 06/03/2019 in reference to pre-operative risk assessment for pending surgery as outlined below.  [name] was last seen on 02/13/19 by Dr Gwenlyn Found.  Since that day, [name] has done well with no new cardiac complaints.  Pt has hx of CABG 2009 with most recent intervention 06/2016, stenting of native circumflex.  Normal EF. Bil carotid dz, 60-79%. Pt is low risk for this procedure.   Pt is on DAPT with aspirin and Plavix. Ok to interrupt aspirin and Plavix as needed for surgery. Resume as soon as felt safe post-procedure.   Therefore, based on ACC/AHA guidelines, the patient would be at acceptable risk for the planned procedure without further cardiovascular testing.   I will route this recommendation to the requesting party via Epic fax function and remove from pre-op pool.  Please call with questions.  Daune Perch, NP 06/03/2019, 1:39 PM

## 2019-06-03 NOTE — Telephone Encounter (Signed)
Returned call to [employer]) she states that she dropped off a [facility] cardiac clearance Monday and I do not see it in Epic. Called [facility] they will fax it for clearance for their surgery 06-17-2019

## 2019-06-03 NOTE — Telephone Encounter (Signed)
New message:    Patient wife calling stating that they had drop a paper to be fill out  By Dr. Gwenlyn Found the form should be faxed. Please call patient wife.

## 2019-06-03 NOTE — Telephone Encounter (Signed)
OK to interrupt antiplatelet Rx. Cleared for surgery at low risk

## 2019-06-03 NOTE — Telephone Encounter (Signed)
   [facility] Pre-operative Risk Assessment    Request for surgical clearance:  1. What type of surgery is being performed?  L2-3 DECOMPRESSION AND DISECTOMY  2. When is this surgery scheduled? 06/17/19   3. What type of clearance is required (medical clearance vs. Pharmacy clearance to hold med vs. Both)? MEDICAL  4. Are there any medications that need to be held prior to surgery and how long? PLAVIX, ASA   5. Practice name and name of physician performing surgery? [facility]; DR. Armstrong   6. What is your office phone number [phone]    7.   What is your office fax number [phone]  8.   Anesthesia type (None, local, MAC, general) ? GENERAL    [name] 06/03/2019, 11:52 AM  _________________________________________________________________   (provider comments below)

## 2019-06-03 NOTE — Telephone Encounter (Signed)
Dr. Gwenlyn Found, Pt has hx of CABG 2009 with most recent intervention 06/2016, stenting of native circumflex.  Normal EF. Bil carotid dz, 60-79%.  -Can aspirin and Plavix be held for planned lumbar decompression and discectomy?  He is route response back to [facility] PREOP

## 2019-06-03 NOTE — Telephone Encounter (Signed)
Called Malcom back and rescheduled appointment that was scheduled thru [facility]. Also ask him to have surgery clearance letter faxed to [place] from surgeons office.

## 2019-06-09 ENCOUNTER — Ambulatory Visit: Payer: 59 | Admitting: Internal Medicine

## 2019-06-09 ENCOUNTER — Encounter: Payer: Self-pay | Admitting: Internal Medicine

## 2019-06-09 ENCOUNTER — Ambulatory Visit: Payer: Self-pay | Admitting: Orthopedic Surgery

## 2019-06-09 VITALS — BP 112/80 | HR 71 | Temp 98.2°F | Ht 67.0 in | Wt 152.0 lb

## 2019-06-09 DIAGNOSIS — E785 Hyperlipidemia, unspecified: Secondary | ICD-10-CM | POA: Diagnosis not present

## 2019-06-09 DIAGNOSIS — R972 Elevated prostate specific antigen [PSA]: Secondary | ICD-10-CM

## 2019-06-09 DIAGNOSIS — E039 Hypothyroidism, unspecified: Secondary | ICD-10-CM

## 2019-06-09 DIAGNOSIS — I10 Essential (primary) hypertension: Secondary | ICD-10-CM | POA: Diagnosis not present

## 2019-06-09 DIAGNOSIS — Z8679 Personal history of other diseases of the circulatory system: Secondary | ICD-10-CM

## 2019-06-09 DIAGNOSIS — Z23 Encounter for immunization: Secondary | ICD-10-CM

## 2019-06-09 MED FILL — OXYCODONE-ACETAMINOPHEN 5-3: 5-325 | 7 days supply | Qty: 28 | Fill #0

## 2019-06-09 NOTE — H&P (Signed)
Subjective:   [name] is a pleasant 68 year old With past medical history significant for cardiac problems including a 5 vessel bypass (on Plavix), Tobacco use disorder, anxiety/depression, liver disease, high cholesterol, hypertension, stroke/TIA, thyroid problems, who is currently being worked up for prostate cancer via biopsy which has been postponed who was initially seen on 05/19/2019 for new onset rates anterior lateral thigh pain. The pain is severe debilitating and has failed to improve with conservative treatment measures Including over-the-counter medications and prescription medications. Patient states that he Does not tolerate gabapentin well, he was previously on hydromorphone which was too strong for him and therefore he Is requesting something less potent for pain today to get him through surgery. Because the pain is so severe and debilitating, the patient has postponed his prostate biopsy in order to move forward with L2-3 Discectomy and Decompression on 06/17/19 At [facility] with Dr. Rolena Infante. We have from his cardiologist. He is scheduled to see has primary care provider tomorrow and we will be awaiting their clearance as well. Patient already has an LSO brace.  Patient Active Problem List   Diagnosis Date Noted  . Right lumbar radiculopathy 05/16/2019  . Elevated PSA 05/16/2019  . S/P angioplasty with stent 06/14/16 PCI & DES of high grade AV groove LCX and 1st OM  06/15/2016  . CAD (coronary artery disease) 06/14/2016  . Abnormal stress test   . Carotid artery disease ([place]) 05/20/2013  . Hypothyroidism 03/29/2012  . Erectile dysfunction 07/02/2011  . Anxiety 07/02/2011  . Back pain 07/02/2011  . Cervical disc disease 07/02/2011  . Coronary artery disease 04/29/2011  . Squamous cell carcinoma of tongue ([place]) 04/29/2011  . Hypertension 04/29/2011  . Depression 04/29/2011  . GE reflux 04/29/2011  . Internal carotid artery stenosis 04/29/2011  . Ptosis of eyelid, left  04/29/2011  . Hyperlipidemia 04/29/2011  . History of ischemic vertebrobasilar artery thalamic stroke 04/29/2011  . Attention deficit disorder 04/29/2011  . Degenerative joint disease of cervical spine 04/29/2011   Past Medical History:  Diagnosis Date  . Anxiety   . Arthritis    "NECK, HANDS, KNEES" (06/14/2016)  . CAD (coronary artery disease)   . Carotid artery stenosis   . Complication of anesthesia    Reports that he had trouble swallowing post anesthesia- CABG  . COPD (chronic obstructive pulmonary disease) ([place]) 12/11/2011   r/s mv - EF 72%; exercise capacity 13 METS; no exercised induced ischemic EKG changes  . Depression, major   . GERD (gastroesophageal reflux disease)   . HTN (hypertension), benign 04/16/2008   echo - EF >55%; no regional wall or valvular abnormalities  . Hyperlipidemia    statin intolerant  . Hypothyroidism   . Migraine    "crippling headaches as a kid; silent migraines now, no pain, couple times/week" (06/14/2016)  . S/P angioplasty with stent 06/14/16 PCI & DES of high grade AV groove LCX and 1st OM  06/15/2016  . Squamous cell cancer of tongue ([place]) ~ 2002   "35 [facility] at [facility]  . Subclavian steal syndrome 06/17/2012   carotid doppler - R systolic brachial pressure 112mmHg, L 155mmHg; R subclavian artery - proxmial obsstruction w/ abnormal monophasic waveforms, R ECA known occlusive disease; L ECA narrowing w/ 70-99% diameter reductiona  . TIA (transient ischemic attack) 2005  . Viral hepatitis 1970s   "non specific"    Past Surgical History:  Procedure Laterality Date  . BACK SURGERY    . BIOPSY  02/20/2018   Procedure: BIOPSY;  Surgeon: Juanita Craver, MD;  Location: [facility];  Service: Endoscopy;;  . BIOPSY TONGUE  ~ 2002   "base of tongue and uvula"  . CARDIAC CATHETERIZATION  04/26/2008   L circumflex 99% stenosed midportion straddling a marginal branch; RCA total priximally w/ grade 2 L to R collaterals; renal arteries  widely patent; LAD 30% segmental proximal stenosis  . CARDIAC CATHETERIZATION N/A 05/14/2016   Procedure: Left Heart Cath and Cors/Grafts Angiography;  Surgeon: [name], MD;  Location: [place] [facility];  Service: Cardiovascular;  Laterality: N/A;  . CARDIAC CATHETERIZATION N/A 06/14/2016   Procedure: Coronary Stent Intervention;  Surgeon: [name], MD;  Location: [place] [facility];  Service: Cardiovascular;  Laterality: N/A;  . [place]   [place]  . COLONOSCOPY WITH PROPOFOL N/A 02/20/2018   Procedure: COLONOSCOPY WITH PROPOFOL;  Surgeon: Juanita Craver, MD;  Location: [facility];  Service: Endoscopy;  Laterality: N/A;  . CORONARY ANGIOPLASTY WITH STENT PLACEMENT    . CORONARY ARTERY BYPASS GRAFT  2009   x5; LIMA to LAd, sequential vein to first diagonal branch, first and second obtuse marginal branches; vein to PDA  . ESOPHAGOGASTRODUODENOSCOPY (EGD) WITH PROPOFOL N/A 02/20/2018   Procedure: ESOPHAGOGASTRODUODENOSCOPY (EGD) WITH PROPOFOL;  Surgeon: Juanita Craver, MD;  Location: [facility];  Service: Endoscopy;  Laterality: N/A;  . INSERTION OF MESH N/A 10/29/2017   Procedure: INSERTION OF MESH;  Surgeon: Donnie Mesa, MD;  Location: [place];  Service: General;  Laterality: N/A;  . POLYPECTOMY  02/20/2018   Procedure: POLYPECTOMY;  Surgeon: Juanita Craver, MD;  Location: [facility];  Service: Endoscopy;;  . SHOULDER ARTHROSCOPY WITH SUBACROMIAL DECOMPRESSION AND OPEN ROTATOR C Left 09/28/2013   Procedure: LEFT SHOULDER ARTHROSCOPY WITH SUBACROMIAL DECOMPRESSION AND MINI OPEN ROTATOR CUFF REPAIR, OPEN DISTAL CLAVICLE RESECTION AND OPEN BICEP TENDODESIS ;  Surgeon: Augustin Schooling, MD;  Location: [place];  Service: Orthopedics;  Laterality: Left;  . UMBILICAL HERNIA REPAIR N/A 10/29/2017   Procedure: UMBILICAL HERNIA REPAIR;  Surgeon: Donnie Mesa, MD;  Location: [place];  Service: General;  Laterality: N/A;    Current Outpatient Medications  Medication Sig  Dispense Refill Last Dose  . Alirocumab (PRALUENT) 150 MG/ML SOAJ Inject 150 mg into the skin every 14 (fourteen) days. 2 pen 6 Taking  . amLODipine (NORVASC) 10 MG tablet TAKE 1 TABLET BY MOUTH DAILY 90 tablet 3 Taking  . amphetamine-dextroamphetamine (ADDERALL) 30 MG tablet Take 30 mg by mouth 3 (three) times daily.   Taking  . aspirin 325 MG tablet Take 325 mg by mouth as needed.   Taking  . chlorpheniramine-HYDROcodone (TUSSIONEX PENNKINETIC ER) 10-8 MG/5ML SUER Take 5 mLs by mouth every 12 (twelve) hours as needed for cough. 140 mL 0 Taking  . clopidogrel (PLAVIX) 75 MG tablet TAKE 1 TABLET (75 MG TOTAL) BY MOUTH DAILY. 90 tablet 1 Taking  . hydrochlorothiazide (MICROZIDE) 12.5 MG capsule TAKE 1 CAPSULE BY MOUTH DAILY 90 capsule 3 Taking  . levothyroxine (SYNTHROID) 75 MCG tablet TAKE 1 TABLET (75 MCG TOTAL) BY MOUTH DAILY. 30 tablet 0   . lidocaine (LIDODERM) 5 % Place 1 patch onto the skin daily. Remove & Discard patch within 12 hours or as directed by MD 15 patch 0   . LORazepam (ATIVAN) 1 MG tablet Take 1 tablet (1 mg total) by mouth every 8 (eight) hours as needed for anxiety. (Patient taking differently: Take 1 mg by mouth 4 (four) times daily as needed for anxiety. )  90 tablet 5 Taking  . metoprolol succinate (TOPROL-XL) 50 MG 24 hr tablet TAKE 1 TABLET (50 MG TOTAL) BY MOUTH DAILY. 90 tablet 1 Taking  . oxyCODONE-acetaminophen (PERCOCET/ROXICET) 5-325 MG tablet Take 1 tablet by mouth every 4 (four) hours as needed for severe pain. 20 tablet 0   . pantoprazole (PROTONIX) 40 MG tablet TAKE 1 TABLET (40 MG TOTAL) BY MOUTH DAILY. 90 tablet 3 Taking  . predniSONE (DELTASONE) 10 MG tablet Take in tapering course as directed 6-5-4-3-2-1 21 tablet 0   . tadalafil (ADCIRCA/CIALIS) 20 MG tablet TAKE 1 TABLET (20 MG TOTAL) BY MOUTH DAILY AS NEEDED FOR ERECTILE DYSFUNCTION. 10 tablet 5 Taking  . Tetrahydrozoline HCl (VISINE OP) Place 2-3 drops into both eyes 2 (two) times daily as needed (for dry  eyes).   Taking  . TRINTELLIX 10 MG TABS tablet Take 10 mg by mouth daily.   2 Taking   No current facility-administered medications for this visit.    Allergies  Allergen Reactions  . Crestor [Rosuvastatin Calcium] Other (See Comments)    Difficulty swallowing  . Tramadol Shortness Of Breath  . Lisinopril Cough  . Tricor [Fenofibrate] Other (See Comments)    UNSPECIFIED REACTION  . Lipitor [Atorvastatin Calcium] Other (See Comments)    Muscle spasms    Social History   Tobacco Use  . Smoking status: Former Smoker    Packs/day: 3.00    Years: 30.00    Pack years: 90.00    Types: Cigarettes    Quit date: 01/29/2001    Years since quitting: 18.3  . Smokeless tobacco: Never Used  Substance Use Topics  . Alcohol use: Yes    Alcohol/week: 35.0 standard drinks    Types: 35 Cans of beer per week    Comment: 4-5 day beers    Family History  Problem Relation Age of Onset  . Cancer Mother        ovarian  . Stroke Father   . Diabetes Father   . Arthritis Father   . Hypertension Brother     Review of Systems As stated in HPI  Objective:   Clinical exam: Anes is a pleasant individual, who appears younger than their stated age. He Is alert and orientated 3. No shortness of breath, chest pain. Abdomen is soft and non-tender, negative loss of bowel and bladder control, no rebound tenderness. Negative: skin lesions abrasions contusions  Lungs: Clear to auscultation . Cardiac: No chest pain. Regular rate and rhythm, no rubs gallops or murmurs  Peripheral pulses: 2+ dorsalis pedis/posterior tibialis pulses bilaterally. Compartment soft and nontender.  Gait pattern: Significant right leg pain causing altered gait pattern  Assistive devices: None  Neuro: 4/5 right hip flexor/quad strength. 5/5 motor strength in the remainder of the lower extremity. Positive femoral stretch test with reproduction of anterior thigh pain on the right side. Negative Babinski test, no clonus, 1+  deep tendon reflexes at the knee and Achilles.  Musculoskeletal: Significant back pain with palpation and range of motion. Right gluteal and anterior thigh pain is severe and debilitating. No pain with passive range of motion of the hip, knee, ankle. No SI joint pain.  X-rays of the lumbar spine demonstrate some mild degenerative changes but no scoliosis or spinal listhesis.  MRI of the lumbar spine dated 05/26/19: Large right central disc herniation at L2-3 causing marked compression of the traversing L3 nerve root. The disc travels caudal to the disc space causing the L2 III nerve compression. There is some  contact to the exiting L2 nerve root but it is minimal. Moderate to severe bilateral neural foraminal narrowing at L3-4. Severe left foraminal narrowing at L4-5.  Assessment:   Wadell is a pleasant 68 year old With past medical history significant for cardiac problems including a 5 vessel bypass (on Plavix), Tobacco use disorder, anxiety/depression, liver disease, high cholesterol, hypertension, stroke/TIA, thyroid problems, who is currently being worked up for prostate cancer via biopsy which has been postponed who was initially seen on 05/19/2019 for new onset right anterior lateral thigh pain. The pain is severe debilitating and has failed to improve with conservative treatment measures Including over-the-counter medications and prescription medications.  Patient's clinical exam demonstrates L3 radiculopathy with weakness, numbness, and severe radicular pain. Imaging studies confirm the large posterior lateral to the right disc herniation at L2-3 with L3 neural compression. At this point time I have gone over the imaging studies and the pathology with the patient and his wife. All their questions were encouraged and addressed. We discussed injection therapy versus surgical intervention. The patient has indicated he would like to move forward with surgery which I think is reasonable. Given the size  of the disc herniation the likelihood of an injection working his low.  Plan:    Move forward with L2-3 discectomy (right)  Risks and benefits of surgery were discussed with the patient. These include: Infection, bleeding, death, stroke, paralysis, ongoing or worse pain, need for additional surgery, leak of spinal fluid, adjacent segment degeneration requiring additional surgery, post-operative hematoma formation that can result in neurological compromise and the need for urgent/emergent re-operation. Loss in bowel and bladder control. Injury to major vessels that could result in the need for urgent abdominal surgery to stop bleeding. Risk of deep venous thrombosis (DVT) and the need for additional treatment. Recurrent disc herniation resulting in the need for revision surgery, which could include fusion surgery (utilizing instrumentation such as pedicle screws and intervertebral cages). Additional risk: If instrumentation is used there is a risk of migration, or breakage of that hardware that could require additional surgery.  We have also discussed the goals of surgery to include: Goals of surgery: Reduction in pain, and improvement in quality of life.  We have also discussed the post-operative recovery period to include: bathing/showering restrictions, wound healing, activity (and driving) restrictions, medications/pain mangement  We have also discussed post-operative redflags to include: signs and symptoms of postoperative infection, DVT/PE.  We have obtained clearance from his cardiologist, still awaiting clearance from his PCP.  After checking the prescription drug monitoring report, I did prescribe the patient one week's worth of 5/325 oxycodone/acetaminophen to help manage his pain until surgery.  Follow-up: 2 weeks postoperatively

## 2019-06-09 NOTE — Progress Notes (Signed)
Subjective:    Patient ID: [name], male    DOB: 04-May-1951, 68 y.o.   MRN: [id number]  HPI 68 year old Male for medical clearance for L2-L3 decompression and disectomy by Dr. Rolena Infante scheduled for September 16.  Patient says he has been cleared by his Cardiologist, Dr. Gwenlyn Found.  He was found to have elevated PSA in March of 6.6. Was 3.7 in April 2018. Is to undergo prostate biopsy under sedation in the near future after he recovers from disc surgery. He is being followed by Dr. Lawerance Bach. Had MR of prostate concerning for high grade carcinoma left lateral mid gland.  He says his back pain started after standing in kitchen in August talking to his son for about 2 hours at son's home. Prior to that had been helping wife with yard work. Pain has been fairly severe and has not completely responded to course of steroids and pain meds.  He has hypothyroidism diagnosed March 2020 with TSH 15.62. Most recent TSH 1.35 on thyroid replacement medication.  Patient has history of depression, he is status post CABG x5 in 2009, history of smoking, history of stroke, cervical disc disease, squamous cell carcinoma of the tongue, history of left Horner syndrome, external carotid artery stenosis on the left, history of migraine headaches, allergic rhinitis, attention deficit disorder.  History of low white blood cell count in the 3700 range which has been followed for several years and is asymptomatic  History of hyperlipidemia but is statin intolerant.    History of blood pressure differential due to subclavian stenosis.  History of 70% right subclavian artery stenosis.  Noted to have 50 mm upper extremity blood pressure differential.  Patient had left shoulder arthroscopy with subacromial decompression and open rotator cuff repair by Dr. Veverly Fells in 2014.  Had open distal clavicle resection and open biceps tendodesis.  History of discectomy of the C-spine around 2000.  After cardiac surgery he  developed left eye ptosis.  He says his face feels numb when he turns his head to the left.  Colonoscopy by Dr. Collene Mares in 2009.  In January 2018 he was seen in the emergency department with palpitations diaphoresis and lightheadedness.  D-dimer was elevated at 0.75.  CT chest angiogram with contrast showed no evidence of PE.  Was thought not to have an MI and was discharged home.  Says he had viral hepatitis a number of years ago.  History of GE reflux.  History of left thalamic stroke in 2005.  History of numbness of the left cheek in the C3 distribution.  Documented facet disease on the left at C2-C3, C4-C5 and C5-C6 as well as C6-C7 aggravated by turning his head.  His Neurologist, Dr. Erling Cruz who is now retired felt that the patient's carotid disease was due to radiation therapy for squamous cell carcinoma of the tongue which was treated with radiation therapy in 2002.  Social history: Wife works for [employer] with [employer].  He has been working some in Architect.  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  He smoked for 30 years but quit over 20 years ago.  1 son from previous marriage.  This is his marriage.  He does drink beer.  Family history: Mother died of ovarian cancer.  Father died apparently from heart failure but also had history of stroke around age 74 and diabetes.  Maternal grandmother and paternal grandmother  with history of cancer.  He has 2 brothers.    Review of Systems no complaint of chest pain or shortness of breath.  Does appear to be in pain from lumbar disc     Objective:   Physical Exam Blood pressure 112/70, pulse 71 and regular temperature 98.2 degrees pulse oximetry 98%.  Weight 152 pounds.  BMI 23.81.  Skin warm and dry.  Nodes none.  Neck is supple without JVD thyromegaly or carotid bruits.  Chest clear to auscultation.  Cardiac exam regular rate and rhythm  normal S1 and S2.  No lower extremity edema.  Neuro no gross focal deficits on brief neurological exam.       Assessment & Plan:  Lumbar disc L2-L3.  Has been cleared by Cardiologist for surgery.  Disectomy planned for Wednesday, September 16  Abnormal EKG with history of right bundle branch block, inferior infarct pattern and left axis deviation.  No change from EKG August 2019  History of CABG  History of subclavian artery stenosis with blood pressure discrepancy of approximately 50 mm in mercury.  History of PCI by Dr. Alvester Chou.  Hyperlipidemia treated with Zetia because of statin intolerance  Essential hypertension-stable on current regimen  History of coronary artery disease status post CABG  History of carcinoma base of tongue status post radiation therapy  History of carotid disease  History of cervical disc disease  Hypothyroidism-TSH is now normal on current dose of thyroid replacement  History of left thalamic stroke 2005  History of left Horner syndrome  Elevated PSA followed by Dr. Lawerance Bach and is to have prostate biopsy under sedation in the near future  History of left shoulder arthropathy status post surgery by Dr. Alma Friendly in 2014 with rotator cuff repair  History of depression  Plan: Cardiologist has cleared him for lumbar disc surgery.  I am also clearing him for lumbar disc surgery.

## 2019-06-09 NOTE — Progress Notes (Signed)
[place], [place] [address] Phone: [phone] Fax: [id number] - [place], [place] - [address] AT [place] [address] [place] [address] Phone: [phone] Fax: [phone]      Your procedure is scheduled on June 17, 2019.  Report to [facility] Main Entrance "A" at 06:30 A.M., and check in at the Admitting office.  Call this number if you have problems the morning of surgery:  [phone]  Call [phone] if you have any questions prior to your surgery date Monday-Friday 8am-4pm    Remember:  Do not eat or drink after midnight the night before your surgery   Take these medicines the morning of surgery with A SIP OF WATER : Amlodipine (Norvasc) Levothyroxine (Synthroid) Lorazepam (Ativan) if needed Metoprolol succinate (Toprol-XL) Oxycodone-acetaminophen (Percocet) if needed Pantoprazole (Protonix) Eye drops if needed Trintellix  Follow your's Doctor's instructions regarding your PLAVIX.  If you have not received instructions when to HOLD your Plavix, please call your doctor.  7 days prior to surgery STOP taking any Aspirin (unless otherwise instructed by your surgeon), Aleve, Naproxen, Ibuprofen, Motrin, Advil, Goody's, BC's, all herbal medications, fish oil, and all vitamins.    The Morning of Surgery  Do not wear jewelry.  Do not wear lotions, powders, or perfumes/colognes, or deodorant  Do not shave 48 hours prior to surgery.  Men may shave face and neck.  Do not bring valuables to the hospital.  [facility] is not responsible for any belongings or valuables.  If you are a smoker, DO NOT Smoke 24 hours prior to surgery IF you wear a CPAP at night please bring your mask, tubing, and machine the morning of surgery   Remember that you must have someone to transport you home after  your surgery, and remain with you for 24 hours if you are discharged the same day.   Contacts, glasses, hearing aids, dentures or bridgework may not be worn into surgery.    Leave your suitcase in the car.  After surgery it may be brought to your room.  For patients admitted to the hospital, discharge time will be determined by your treatment team.  Patients discharged the day of surgery will not be allowed to drive home.    Special instructions:   [facility]- Preparing For Surgery  Before surgery, you can play an important role. Because skin is not sterile, your skin needs to be as free of germs as possible. You can reduce the number of germs on your skin by washing with CHG (chlorahexidine gluconate) Soap before surgery.  CHG is an antiseptic cleaner which kills germs and bonds with the skin to continue killing germs even after washing.    Oral Hygiene is also important to reduce your risk of infection.  Remember - BRUSH YOUR TEETH THE MORNING OF SURGERY WITH YOUR REGULAR TOOTHPASTE  Please do not use if you have an allergy to CHG or antibacterial soaps. If your skin becomes reddened/irritated stop using the CHG.  Do not shave (including legs and underarms) for at least 48 hours prior to first CHG shower. It is OK to shave your face.  Please follow these instructions carefully.   1. Shower the NIGHT BEFORE SURGERY and the MORNING OF SURGERY with CHG Soap.   2. If you chose to wash your hair,  wash your hair first as usual with your normal shampoo.  3. After you shampoo, rinse your hair and body thoroughly to remove the shampoo.  4. Use CHG as you would any other liquid soap. You can apply CHG directly to the skin and wash gently with a scrungie or a clean washcloth.   5. Apply the CHG Soap to your body ONLY FROM THE NECK DOWN.  Do not use on open wounds or open sores. Avoid contact with your eyes, ears, mouth and genitals (private parts). Wash Face and genitals (private parts)   with your normal soap.   6. Wash thoroughly, paying special attention to the area where your surgery will be performed.  7. Thoroughly rinse your body with warm water from the neck down.  8. DO NOT shower/wash with your normal soap after using and rinsing off the CHG Soap.  9. Pat yourself dry with a CLEAN TOWEL.  10. Wear CLEAN PAJAMAS to bed the night before surgery, wear comfortable clothes the morning of surgery  11. Place CLEAN SHEETS on your bed the night of your first shower and DO NOT SLEEP WITH PETS.    Day of Surgery:  Do not apply any deodorants/lotions. Please shower the morning of surgery with the CHG soap  Please wear clean clothes to the hospital/surgery center.   Remember to brush your teeth WITH YOUR REGULAR TOOTHPASTE.   Please read over the following fact sheets that you were given.

## 2019-06-10 ENCOUNTER — Encounter (HOSPITAL_COMMUNITY)
Admission: RE | Admit: 2019-06-10 | Discharge: 2019-06-10 | Disposition: A | Discharge status: Home / Self Care | Payer: 59 | Source: Ambulatory Visit | Attending: Orthopedic Surgery | Admitting: Orthopedic Surgery

## 2019-06-10 ENCOUNTER — Encounter (HOSPITAL_COMMUNITY): Payer: Self-pay

## 2019-06-10 ENCOUNTER — Other Ambulatory Visit: Payer: Self-pay

## 2019-06-10 DIAGNOSIS — I4510 Unspecified right bundle-branch block: Secondary | ICD-10-CM | POA: Insufficient documentation

## 2019-06-10 DIAGNOSIS — J90 Pleural effusion, not elsewhere classified: Secondary | ICD-10-CM | POA: Diagnosis not present

## 2019-06-10 DIAGNOSIS — Z01812 Encounter for preprocedural laboratory examination: Secondary | ICD-10-CM | POA: Insufficient documentation

## 2019-06-10 DIAGNOSIS — Z01818 Encounter for other preprocedural examination: Secondary | ICD-10-CM | POA: Diagnosis not present

## 2019-06-10 DIAGNOSIS — Z20828 Contact with and (suspected) exposure to other viral communicable diseases: Secondary | ICD-10-CM | POA: Diagnosis not present

## 2019-06-10 HISTORY — DX: Dyspnea, unspecified: R06.00

## 2019-06-10 LAB — CBC
HCT: 49.2 % (ref 39.0–52.0)
Hemoglobin: 16.3 g/dL (ref 13.0–17.0)
MCH: 27.5 pg (ref 26.0–34.0)
MCHC: 33.1 g/dL (ref 30.0–36.0)
MCV: 83 fL (ref 80.0–100.0)
Platelets: 223 10*3/uL (ref 150–400)
RBC: 5.93 MIL/uL — ABNORMAL HIGH (ref 4.22–5.81)
RDW: 16.8 % — ABNORMAL HIGH (ref 11.5–15.5)
WBC: 5.3 10*3/uL (ref 4.0–10.5)
nRBC: 0 % (ref 0.0–0.2)

## 2019-06-10 LAB — BASIC METABOLIC PANEL
Anion gap: 10 (ref 5–15)
BUN: 18 mg/dL (ref 8–23)
CO2: 26 mmol/L (ref 22–32)
Calcium: 9.3 mg/dL (ref 8.9–10.3)
Chloride: 102 mmol/L (ref 98–111)
Creatinine, Ser: 1.37 mg/dL — ABNORMAL HIGH (ref 0.61–1.24)
GFR calc Af Amer: >60 mL/min (ref >60)
GFR calc non Af Amer: 53 mL/min — ABNORMAL LOW (ref >60)
Glucose, Bld: 171 mg/dL — ABNORMAL HIGH (ref 70–99)
Potassium: 3.4 mmol/L — ABNORMAL LOW (ref 3.5–5.1)
Sodium: 138 mmol/L (ref 135–145)

## 2019-06-10 LAB — URINALYSIS, ROUTINE W REFLEX MICROSCOPIC
Bilirubin Urine: NEGATIVE
Glucose, UA: NEGATIVE mg/dL
Hgb urine dipstick: NEGATIVE
Ketones, ur: NEGATIVE mg/dL
Leukocytes,Ua: NEGATIVE
Nitrite: NEGATIVE
Protein, ur: 30 mg/dL — AB
Specific Gravity, Urine: 1.028 (ref 1.005–1.030)
pH: 5 (ref 5.0–8.0)

## 2019-06-10 LAB — APTT: aPTT: 27 seconds (ref 24–36)

## 2019-06-10 LAB — SURGICAL PCR SCREEN
MRSA, PCR: NEGATIVE
Staphylococcus aureus: NEGATIVE

## 2019-06-10 LAB — PROTIME-INR
INR: 1 (ref 0.8–1.2)
Prothrombin Time: 13 seconds (ref 11.4–15.2)

## 2019-06-10 LAB — TSH: TSH: 1.35 mIU/L (ref 0.40–4.50)

## 2019-06-10 NOTE — Progress Notes (Signed)
Call Dr Valla Leaver office regarding patients abnormal urine results.  Spoke with Marlowe Kays to inform Dr Rolena Infante to review results in [place].

## 2019-06-10 NOTE — Progress Notes (Signed)
Patient denies shortness of breath, fever, cough and chest pain at PAT appointment  PCP - Dr Tedra Senegal Cardiologist - Dr Quay Burow Urologist - Dr Lawerance Bach  Chest x-ray - 06/10/19 EKG - 06/10/19 Stress Test - 03/29/16 ECHO - 03/19/16 Cardiac Cath -  06/14/16  Blood Thinner Instructions: Stop Plavix 5 days prior to surgery - Last dose 06/12/19 per patient.  Anesthesia review: Yes Cardiac Clearance 06/17/19  7 days prior to surgery STOP taking any Aspirin (unless otherwise instructed by your surgeon), Aleve, Naproxen, Ibuprofen, Motrin, Advil, Goody's, BC's, all herbal medications, fish oil, and all vitamins.   Coronavirus Screening Have you or your wife experienced the following symptoms:  Cough yes/no: No Fever (>100.9F)  yes/no: No Runny nose yes/no: No Sore throat yes/no: No Difficulty breathing/shortness of breath  yes/no: No  Have you or your wife  traveled in the last 14 days and where? yes/no: No  Patient verbalized understanding of instructions that were given to them at the PAT appointment.

## 2019-06-11 MED FILL — SULFAMETHOXAZOLE-TMP DS TAB: 800-160 | 7 days supply | Qty: 14 | Fill #0

## 2019-06-11 NOTE — Anesthesia Preprocedure Evaluation (Addendum)
Anesthesia Evaluation  Patient identified by MRN, date of birth, ID band Patient awake    Reviewed: Allergy & Precautions, NPO status , Patient's Chart, lab work & pertinent test results, reviewed documented beta blocker date and time   History of Anesthesia Complications Negative for: history of anesthetic complications  Airway Mallampati: II  TM Distance: >3 FB Neck ROM: Full    Dental  (+) Dental Advisory Given, Implants   Pulmonary shortness of breath, COPD, neg recent URI, former smoker,    breath sounds clear to auscultation       Cardiovascular hypertension, Pt. on medications and Pt. on home beta blockers (-) angina+ CAD, + Cardiac Stents and + CABG  (-) CHF (-) dysrhythmias  Rhythm:Regular     Neuro/Psych  Headaches, PSYCHIATRIC DISORDERS Anxiety Depression TIA Neuromuscular disease    GI/Hepatic Neg liver ROS, GERD  Medicated and Controlled,  Endo/Other  Hypothyroidism   Renal/GU Renal InsufficiencyRenal disease     Musculoskeletal  (+) Arthritis ,   Abdominal   Peds  Hematology   Anesthesia Other Findings 2017: Moderate size and intensity reversible lateral wall perfusion defect suggestive of ischemia. There is also a large, moderate intensity fixed inferior wall defect with underlying bowel attenuation which is likely artifact. LVEF 58% with normal wall motion. This is an intermediate risk study.   Mid Cx to Dist Cx lesion, 100 %stenosed.  SVG.  Origin lesion, 100 %stenosed.  Prox Cx to Mid Cx lesion, 80 %stenosed.  Post intervention, there is a 0% residual stenosis.  A stent was successfully placed.  Ost 2nd Mrg to 2nd Mrg lesion, 95 %stenosed.  Post intervention, there is a 0% residual stenosis.  A stent was successfully placed.    Reproductive/Obstetrics                            Anesthesia Physical Anesthesia Plan  ASA: III  Anesthesia Plan: General    Post-op Pain Management:    Induction: Intravenous  PONV Risk Score and Plan: 2 and Ondansetron and Dexamethasone  Airway Management Planned: Oral ETT  Additional Equipment: None  Intra-op Plan:   Post-operative Plan:   Informed Consent: I have reviewed the patients History and Physical, chart, labs and discussed the procedure including the risks, benefits and alternatives for the proposed anesthesia with the patient or authorized representative who has indicated his/her understanding and acceptance.     Dental advisory given  Plan Discussed with: CRNA and Surgeon  Anesthesia Plan Comments: (Follows with cardiology for hx of CAD s/p CABG 2009 with most recent intervention 06/2016, stenting of native circumflex.  Normal EF. Bil carotid dz, 60-79%. Clearance per telephone encounter 06/03/19:  "QUINTRELL HAMLETT was last seen on 02/13/19 by Dr Gwenlyn Found.  Since that day, NISHAD CARNAGHI has done well with no new cardiac complaints.  Pt has hx of CABG 2009 with most recent intervention 06/2016, stenting of native circumflex.Normal EF. Bil carotid dz, 60-79%. Pt is low risk for this procedure.Pt is on DAPT with aspirin and Plavix. Ok to interrupt aspirin and Plavix as needed for surgery. Resume as soon as felt safe post-procedure. Therefore, based on ACC/AHA guidelines, the patient would be at acceptable risk for the planned procedure without further cardiovascular testing. "  PCI 06/14/16:  Mid Cx to Dist Cx lesion, 100 %stenosed.  SVG.  Origin lesion, 100 %stenosed.  Prox Cx to Mid Cx lesion, 80 %stenosed.  Post intervention, there is a 0%  residual stenosis.  A stent was successfully placed.  Ost 2nd Mrg to 2nd Mrg lesion, 95 %stenosed.  Post intervention, there is a 0% residual stenosis.  A stent was successfully placed.    Cath 05/14/16:  Ost RCA to Prox RCA lesion, 100 %stenosed.  Ost 1st Diag to 1st Diag lesion, 100 %stenosed.  Mid Cx to Dist Cx lesion, 100  %stenosed.  Ost 2nd Mrg to 2nd Mrg lesion, 90 %stenosed.  Prox Cx to Mid Cx lesion, 70 %stenosed.  Origin to Prox Graft lesion, 100 %stenosed.  Origin to Dist Graft lesion, 100 %stenosed.  LIMA.  The left ventricular systolic function is normal.  LV end diastolic pressure is normal.  The left ventricular ejection fraction is 55-65% by visual estimate.)       Anesthesia Quick Evaluation

## 2019-06-11 NOTE — Telephone Encounter (Signed)
This encounter was created in error - please disregard.

## 2019-06-11 NOTE — Telephone Encounter (Signed)
   [facility] Pre-operative Risk Assessment    Request for surgical clearance:  1. What type of surgery is being performed?  L2-3 discectomy    2. When is this surgery scheduled? 06-17-2019   3. What type of clearance is required (medical clearance vs. Pharmacy clearance to hold med vs. Both)?  BOTH    4. Are there any medications that need to be held prior to surgery and how long?  PLAVIX  5. Practice name and name of physician performing surgery?  [facility] ATTN: SHERRY   6. What is your office phone number [phone]    7.   What is your office fax number [phone]  8.   Anesthesia type (None, local, MAC, general) ? NOT LISTED   [name] 06/11/2019, 1:12 PM  _________________________________________________________________   (provider comments below)

## 2019-06-11 NOTE — Telephone Encounter (Signed)
   This patient's preoperative status was addressed in the telephone note from 06/03/2019. Will re-fax recommendations to Dr. Rolena Infante' office.   Abigail Butts, PA-C 06/11/19; 1:36 PM

## 2019-06-12 ENCOUNTER — Encounter: Payer: Self-pay | Admitting: Internal Medicine

## 2019-06-12 ENCOUNTER — Telehealth: Payer: Self-pay | Admitting: Internal Medicine

## 2019-06-12 ENCOUNTER — Other Ambulatory Visit: Payer: Self-pay | Admitting: Internal Medicine

## 2019-06-12 MED FILL — LEVOTHYROXINE 75 MCG TABLET: 75 | 90 days supply | Qty: 90 | Fill #0

## 2019-06-12 MED FILL — PRALUENT 150 MG/ML PEN: 150 | 28 days supply | Qty: 2 | Fill #6

## 2019-06-12 NOTE — Telephone Encounter (Signed)
Phone call from [facility] PA, Estill Bamberg regarding abnormal urine specimen containing bacteria. Pt is scheduled for lumbar spine surgery next week. He has ben placed on Bactrim per PA. Advised her that culture should be sent. I will follow up with him Monday.

## 2019-06-13 ENCOUNTER — Other Ambulatory Visit (HOSPITAL_COMMUNITY)
Admission: RE | Admit: 2019-06-13 | Discharge: 2019-06-13 | Disposition: A | Discharge status: Home / Self Care | Payer: 59 | Source: Ambulatory Visit | Attending: Orthopedic Surgery | Admitting: Orthopedic Surgery

## 2019-06-13 DIAGNOSIS — I4510 Unspecified right bundle-branch block: Secondary | ICD-10-CM | POA: Diagnosis not present

## 2019-06-13 DIAGNOSIS — Z01812 Encounter for preprocedural laboratory examination: Secondary | ICD-10-CM | POA: Diagnosis not present

## 2019-06-13 DIAGNOSIS — Z20828 Contact with and (suspected) exposure to other viral communicable diseases: Secondary | ICD-10-CM | POA: Diagnosis not present

## 2019-06-14 LAB — NOVEL CORONAVIRUS, NAA (HOSP ORDER, SEND-OUT TO REF LAB; TAT 18-24 HRS): SARS-CoV-2, NAA: NOT DETECTED

## 2019-06-15 ENCOUNTER — Ambulatory Visit (INDEPENDENT_AMBULATORY_CARE_PROVIDER_SITE_OTHER): Payer: 59 | Admitting: Internal Medicine

## 2019-06-15 ENCOUNTER — Telehealth: Payer: Self-pay | Admitting: Internal Medicine

## 2019-06-15 ENCOUNTER — Encounter: Payer: Self-pay | Admitting: Internal Medicine

## 2019-06-15 ENCOUNTER — Other Ambulatory Visit: Payer: Self-pay

## 2019-06-15 VITALS — BP 140/70 | HR 74 | Temp 98.3°F | Ht 67.0 in | Wt 151.0 lb

## 2019-06-15 DIAGNOSIS — R8290 Unspecified abnormal findings in urine: Secondary | ICD-10-CM | POA: Diagnosis not present

## 2019-06-15 LAB — POCT URINALYSIS DIPSTICK
Appearance: NEGATIVE
Bilirubin, UA: NEGATIVE
Blood, UA: NEGATIVE
Glucose, UA: NEGATIVE
Ketones, UA: NEGATIVE
Leukocytes, UA: NEGATIVE
Nitrite, UA: NEGATIVE
Odor: NEGATIVE
Protein, UA: POSITIVE — AB
Spec Grav, UA: 1.01 (ref 1.010–1.025)
Urobilinogen, UA: 0.2 E.U./dL
pH, UA: 6.5 (ref 5.0–8.0)

## 2019-06-15 NOTE — Progress Notes (Signed)
   Subjective:    Patient ID: [name], male    DOB: 09/26/1951, 68 y.o.   MRN: [id number]  HPI 68 year old Male scheduled for lumbar disc surgery on Wednesday Sept 16 by Dr. Rolena Infante at [facility].  He went for preop labs on September 9.  He was not well hydrated at that time.  Urine specimen showed rare bacteria.  He reported his urine was concentrated.  He patient directly into the cup.  PA from [facility] called and said he possibly could have a UTI and she was starting him on Bactrim.  I asked her to get a culture but apparently culture was not obtained.  Patient emailed yesterday indicating he was confused about the situation.  He is here today for follow-up.  His urine specimen today shows some protein but no LE or nitrites.  Culture was sent.  He is now had 5 doses of Septra DS.  He is to finish today and tomorrow and then stop the medication.  I doubt he has a UTI.  He wants to make sure that I will let Dr.Ron  Rosana Hoes noted that he is postponing urology procedure due to his acute lumbar disc issue.  I will make sure Dr. Rosana Hoes gets copy of this note.    Review of Systems no dysuria frequency or chills.  No fever.     Objective:   Physical Exam  See urine dipstick results today.      Assessment & Plan:  Abnormal urine specimen on September 9  Specimen today is within normal limits but has had a few doses of Bactrim DS from PA at [facility]  Plan: Urine culture obtained today.  Theophilus Kinds DS today and tomorrow and then discontinue prescription.  Surgery scheduled for Wednesday, September 16.

## 2019-06-15 NOTE — Patient Instructions (Signed)
Medically cleared for surgery.  Cardiologist is cleared for surgery as well.

## 2019-06-15 NOTE — Progress Notes (Deleted)
   Subjective:    Patient ID: [name], male    DOB: [birth year], 68 y.o.   MRN: [id number]  HPI    Review of Systems     Objective:   Physical Exam        Assessment & Plan:

## 2019-06-15 NOTE — Patient Instructions (Signed)
Continue to take Septra DS today and tomorrow then discontinue prescription.  Culture pending.

## 2019-06-15 NOTE — Telephone Encounter (Signed)
Faxed (7 pages) office notes and labs to Dr Lawerance Bach [phone], office number [phone]

## 2019-06-17 ENCOUNTER — Ambulatory Visit (HOSPITAL_COMMUNITY): Payer: 59 | Admitting: Physician Assistant

## 2019-06-17 ENCOUNTER — Ambulatory Visit (HOSPITAL_COMMUNITY): Payer: 59 | Admitting: Anesthesiology

## 2019-06-17 ENCOUNTER — Ambulatory Visit (HOSPITAL_COMMUNITY)
Admission: RE | Disposition: A | Discharge status: Home / Self Care | Payer: Self-pay | Source: Home / Self Care | Attending: Orthopedic Surgery

## 2019-06-17 ENCOUNTER — Observation Stay (HOSPITAL_COMMUNITY)
Admission: RE | Admit: 2019-06-17 | Discharge: 2019-06-18 | Disposition: A | Discharge status: Home / Self Care | Payer: 59 | Attending: Orthopedic Surgery | Admitting: Orthopedic Surgery

## 2019-06-17 ENCOUNTER — Ambulatory Visit (HOSPITAL_COMMUNITY): Payer: 59

## 2019-06-17 ENCOUNTER — Encounter (HOSPITAL_COMMUNITY): Payer: Self-pay | Admitting: *Deleted

## 2019-06-17 ENCOUNTER — Other Ambulatory Visit: Payer: Self-pay

## 2019-06-17 DIAGNOSIS — E039 Hypothyroidism, unspecified: Secondary | ICD-10-CM | POA: Insufficient documentation

## 2019-06-17 DIAGNOSIS — I10 Essential (primary) hypertension: Secondary | ICD-10-CM | POA: Insufficient documentation

## 2019-06-17 DIAGNOSIS — Z87891 Personal history of nicotine dependence: Secondary | ICD-10-CM | POA: Diagnosis not present

## 2019-06-17 DIAGNOSIS — Z951 Presence of aortocoronary bypass graft: Secondary | ICD-10-CM | POA: Insufficient documentation

## 2019-06-17 DIAGNOSIS — E785 Hyperlipidemia, unspecified: Secondary | ICD-10-CM | POA: Diagnosis not present

## 2019-06-17 DIAGNOSIS — M5116 Intervertebral disc disorders with radiculopathy, lumbar region: Secondary | ICD-10-CM | POA: Diagnosis not present

## 2019-06-17 DIAGNOSIS — Z923 Personal history of irradiation: Secondary | ICD-10-CM | POA: Diagnosis not present

## 2019-06-17 DIAGNOSIS — J449 Chronic obstructive pulmonary disease, unspecified: Secondary | ICD-10-CM | POA: Diagnosis not present

## 2019-06-17 DIAGNOSIS — Z419 Encounter for procedure for purposes other than remedying health state, unspecified: Secondary | ICD-10-CM

## 2019-06-17 DIAGNOSIS — M4696 Unspecified inflammatory spondylopathy, lumbar region: Secondary | ICD-10-CM | POA: Diagnosis not present

## 2019-06-17 DIAGNOSIS — M5126 Other intervertebral disc displacement, lumbar region: Secondary | ICD-10-CM | POA: Diagnosis not present

## 2019-06-17 DIAGNOSIS — Z955 Presence of coronary angioplasty implant and graft: Secondary | ICD-10-CM | POA: Insufficient documentation

## 2019-06-17 DIAGNOSIS — I2510 Atherosclerotic heart disease of native coronary artery without angina pectoris: Secondary | ICD-10-CM | POA: Diagnosis not present

## 2019-06-17 DIAGNOSIS — Z8673 Personal history of transient ischemic attack (TIA), and cerebral infarction without residual deficits: Secondary | ICD-10-CM | POA: Diagnosis not present

## 2019-06-17 DIAGNOSIS — K219 Gastro-esophageal reflux disease without esophagitis: Secondary | ICD-10-CM | POA: Insufficient documentation

## 2019-06-17 DIAGNOSIS — Z85810 Personal history of malignant neoplasm of tongue: Secondary | ICD-10-CM | POA: Insufficient documentation

## 2019-06-17 DIAGNOSIS — F418 Other specified anxiety disorders: Secondary | ICD-10-CM | POA: Diagnosis not present

## 2019-06-17 HISTORY — PX: LUMBAR LAMINECTOMY/DECOMPRESSION MICRODISCECTOMY: SHX5026

## 2019-06-17 HISTORY — PX: LUMBAR DISC SURGERY: SHX700

## 2019-06-17 LAB — URINE CULTURE
MICRO NUMBER:: 877365
Result:: NO GROWTH
SPECIMEN QUALITY:: ADEQUATE

## 2019-06-17 SURGERY — LUMBAR LAMINECTOMY/DECOMPRESSION MICRODISCECTOMY 1 LEVEL
Anesthesia: General | Site: Spine Lumbar

## 2019-06-17 MED ORDER — ONDANSETRON HCL 4 MG/2ML IJ SOLN
INTRAMUSCULAR | Status: DC | PRN
  Administered 2019-06-17: 4 mg via INTRAVENOUS

## 2019-06-17 MED ORDER — BUPIVACAINE-EPINEPHRINE 0.25% -1:200000 IJ SOLN
INTRAMUSCULAR | Status: DC | PRN
  Administered 2019-06-17: 10 mL

## 2019-06-17 MED ORDER — ONDANSETRON HCL 4 MG PO TABS: 4.0000 mg | ORAL_TABLET | Freq: Four times a day (QID) | ORAL | Status: DC | PRN

## 2019-06-17 MED ORDER — LACTATED RINGERS IV SOLN
INTRAVENOUS | Status: DC | PRN
  Administered 2019-06-17: 08:00:00 via INTRAVENOUS

## 2019-06-17 MED ORDER — FENTANYL CITRATE (PF) 100 MCG/2ML IJ SOLN
INTRAMUSCULAR | Status: AC
  Administered 2019-06-17: 50 ug via INTRAVENOUS
  Filled 2019-06-17: qty 2

## 2019-06-17 MED ORDER — POLYETHYLENE GLYCOL 3350 17 G PO PACK: 17.0000 g | PACK | Freq: Every day | ORAL | Status: DC | PRN

## 2019-06-17 MED ORDER — LIDOCAINE 2% (20 MG/ML) 5 ML SYRINGE
INTRAMUSCULAR | Status: DC | PRN
  Administered 2019-06-17: 40 mg via INTRAVENOUS

## 2019-06-17 MED ORDER — PROPOFOL 10 MG/ML IV BOLUS
INTRAVENOUS | Status: AC
  Filled 2019-06-17: qty 40

## 2019-06-17 MED ORDER — ACETAMINOPHEN 325 MG PO TABS: 650.0000 mg | ORAL_TABLET | ORAL | Status: DC | PRN

## 2019-06-17 MED ORDER — MIDAZOLAM HCL 2 MG/2ML IJ SOLN
INTRAMUSCULAR | Status: AC
  Filled 2019-06-17: qty 2

## 2019-06-17 MED ORDER — FENTANYL CITRATE (PF) 250 MCG/5ML IJ SOLN
INTRAMUSCULAR | Status: AC
  Filled 2019-06-17: qty 5

## 2019-06-17 MED ORDER — THROMBIN 20000 UNITS EX SOLR
CUTANEOUS | Status: DC | PRN
  Administered 2019-06-17: 20 mL

## 2019-06-17 MED ORDER — OXYCODONE HCL 5 MG/5ML PO SOLN: 5.0000 mg | Freq: Once | ORAL | Status: AC | PRN

## 2019-06-17 MED ORDER — FENTANYL CITRATE (PF) 100 MCG/2ML IJ SOLN
25.0000 ug | INTRAMUSCULAR | Status: DC | PRN
  Administered 2019-06-17 (×4): 50 ug via INTRAVENOUS

## 2019-06-17 MED ORDER — METHOCARBAMOL 500 MG PO TABS
ORAL_TABLET | ORAL | Status: AC
  Administered 2019-06-17: 500 mg via ORAL
  Filled 2019-06-17: qty 1

## 2019-06-17 MED ORDER — CEFAZOLIN SODIUM-DEXTROSE 1-4 GM/50ML-% IV SOLN
1.0000 g | Freq: Three times a day (TID) | INTRAVENOUS | Status: AC
  Administered 2019-06-17 (×2): 1 g via INTRAVENOUS
  Filled 2019-06-17 (×2): qty 50

## 2019-06-17 MED ORDER — ROCURONIUM BROMIDE 100 MG/10ML IV SOLN
INTRAVENOUS | Status: DC | PRN
  Administered 2019-06-17: 60 mg via INTRAVENOUS
  Administered 2019-06-17: 20 mg via INTRAVENOUS

## 2019-06-17 MED ORDER — TRANEXAMIC ACID 1000 MG/10ML IV SOLN
2000.0000 mg | INTRAVENOUS | Status: AC
  Administered 2019-06-17: 2000 mg via TOPICAL
  Filled 2019-06-17: qty 20

## 2019-06-17 MED ORDER — AMPHETAMINE-DEXTROAMPHETAMINE 10 MG PO TABS: 10.0000 mg | ORAL_TABLET | Freq: Three times a day (TID) | ORAL | Status: DC

## 2019-06-17 MED ORDER — LACTATED RINGERS IV SOLN
INTRAVENOUS | Status: DC
  Administered 2019-06-17: 17:00:00 via INTRAVENOUS

## 2019-06-17 MED ORDER — SUGAMMADEX SODIUM 200 MG/2ML IV SOLN
INTRAVENOUS | Status: DC | PRN
  Administered 2019-06-17: 200 mg via INTRAVENOUS

## 2019-06-17 MED ORDER — LIDOCAINE 2% (20 MG/ML) 5 ML SYRINGE
INTRAMUSCULAR | Status: AC
  Filled 2019-06-17: qty 5

## 2019-06-17 MED ORDER — SODIUM CHLORIDE 0.9% FLUSH
3.0000 mL | Freq: Two times a day (BID) | INTRAVENOUS | Status: DC
  Administered 2019-06-17 – 2019-06-18 (×2): 3 mL via INTRAVENOUS

## 2019-06-17 MED ORDER — METOPROLOL SUCCINATE ER 25 MG PO TB24
25.0000 mg | ORAL_TABLET | Freq: Every day | ORAL | Status: DC
  Administered 2019-06-18: 25 mg via ORAL
  Filled 2019-06-17: qty 1

## 2019-06-17 MED ORDER — METHYLPREDNISOLONE ACETATE 40 MG/ML IJ SUSP
INTRAMUSCULAR | Status: DC | PRN
  Administered 2019-06-17: 40 mg

## 2019-06-17 MED ORDER — ONDANSETRON HCL 4 MG PO TABS: 4.0000 mg | ORAL_TABLET | Freq: Three times a day (TID) | ORAL | 0 refills | Status: DC | PRN

## 2019-06-17 MED ORDER — DEXAMETHASONE SODIUM PHOSPHATE 10 MG/ML IJ SOLN
INTRAMUSCULAR | Status: AC
  Filled 2019-06-17: qty 1

## 2019-06-17 MED ORDER — SODIUM CHLORIDE 0.9 % IV SOLN
INTRAVENOUS | Status: DC | PRN
  Administered 2019-06-17: 50 ug/min via INTRAVENOUS

## 2019-06-17 MED ORDER — MAGNESIUM CITRATE PO SOLN: 1.0000 | Freq: Once | ORAL | Status: DC | PRN

## 2019-06-17 MED ORDER — DEXAMETHASONE SODIUM PHOSPHATE 4 MG/ML IJ SOLN
INTRAMUSCULAR | Status: DC | PRN
  Administered 2019-06-17: 10 mg via INTRAVENOUS

## 2019-06-17 MED ORDER — VORTIOXETINE HBR 20 MG PO TABS
20.0000 mg | ORAL_TABLET | Freq: Every day | ORAL | Status: DC
  Administered 2019-06-18: 20 mg via ORAL
  Filled 2019-06-17: qty 1

## 2019-06-17 MED ORDER — OXYCODONE HCL 5 MG PO TABS
5.0000 mg | ORAL_TABLET | Freq: Once | ORAL | Status: AC | PRN
  Administered 2019-06-17: 12:00:00 5 mg via ORAL

## 2019-06-17 MED ORDER — ACETAMINOPHEN 650 MG RE SUPP: 650.0000 mg | RECTAL | Status: DC | PRN

## 2019-06-17 MED ORDER — OXYCODONE HCL 5 MG PO TABS
ORAL_TABLET | ORAL | Status: AC
  Administered 2019-06-17: 5 mg via ORAL
  Filled 2019-06-17: qty 1

## 2019-06-17 MED ORDER — ACETAMINOPHEN 10 MG/ML IV SOLN: 1000.0000 mg | Freq: Once | INTRAVENOUS | Status: DC | PRN

## 2019-06-17 MED ORDER — HYDROCHLOROTHIAZIDE 12.5 MG PO CAPS
12.5000 mg | ORAL_CAPSULE | Freq: Every day | ORAL | Status: DC
  Administered 2019-06-18: 12.5 mg via ORAL
  Filled 2019-06-17: qty 1

## 2019-06-17 MED ORDER — LEVOTHYROXINE SODIUM 75 MCG PO TABS
75.0000 ug | ORAL_TABLET | Freq: Every day | ORAL | Status: DC
  Administered 2019-06-18: 75 ug via ORAL
  Filled 2019-06-17: qty 1

## 2019-06-17 MED ORDER — OXYCODONE HCL 5 MG PO TABS
10.0000 mg | ORAL_TABLET | ORAL | Status: DC | PRN
  Administered 2019-06-17 – 2019-06-18 (×5): 10 mg via ORAL
  Filled 2019-06-17 (×5): qty 2

## 2019-06-17 MED ORDER — ACETAMINOPHEN 500 MG PO TABS: 1000.0000 mg | ORAL_TABLET | Freq: Once | ORAL | Status: DC | PRN

## 2019-06-17 MED ORDER — AMLODIPINE BESYLATE 5 MG PO TABS
5.0000 mg | ORAL_TABLET | Freq: Every day | ORAL | Status: DC
  Administered 2019-06-18: 5 mg via ORAL
  Filled 2019-06-17: qty 1

## 2019-06-17 MED ORDER — PHENYLEPHRINE HCL (PRESSORS) 10 MG/ML IV SOLN
INTRAVENOUS | Status: DC | PRN
  Administered 2019-06-17: 80 ug via INTRAVENOUS
  Administered 2019-06-17: 40 ug via INTRAVENOUS
  Administered 2019-06-17: 80 ug via INTRAVENOUS

## 2019-06-17 MED ORDER — MIDAZOLAM HCL 5 MG/5ML IJ SOLN
INTRAMUSCULAR | Status: DC | PRN
  Administered 2019-06-17: 2 mg via INTRAVENOUS

## 2019-06-17 MED ORDER — FENTANYL CITRATE (PF) 100 MCG/2ML IJ SOLN
INTRAMUSCULAR | Status: DC | PRN
  Administered 2019-06-17 (×3): 50 ug via INTRAVENOUS

## 2019-06-17 MED ORDER — ONDANSETRON HCL 4 MG/2ML IJ SOLN
INTRAMUSCULAR | Status: AC
  Filled 2019-06-17: qty 2

## 2019-06-17 MED ORDER — ALIROCUMAB 150 MG/ML ~~LOC~~ SOAJ: 150.0000 mg | SUBCUTANEOUS | Status: DC

## 2019-06-17 MED ORDER — AMPHETAMINE-DEXTROAMPHETAMINE 10 MG PO TABS
10.0000 mg | ORAL_TABLET | Freq: Three times a day (TID) | ORAL | Status: DC
  Administered 2019-06-18 (×2): 10 mg via ORAL
  Filled 2019-06-17 (×2): qty 1

## 2019-06-17 MED ORDER — BUPIVACAINE-EPINEPHRINE (PF) 0.25% -1:200000 IJ SOLN
INTRAMUSCULAR | Status: AC
  Filled 2019-06-17: qty 30

## 2019-06-17 MED ORDER — OXYCODONE HCL 5 MG PO TABS: 5.0000 mg | ORAL_TABLET | ORAL | Status: DC | PRN

## 2019-06-17 MED ORDER — PANTOPRAZOLE SODIUM 40 MG PO TBEC
40.0000 mg | DELAYED_RELEASE_TABLET | Freq: Every day | ORAL | Status: DC
  Administered 2019-06-18: 40 mg via ORAL
  Filled 2019-06-17: qty 1

## 2019-06-17 MED ORDER — METHOCARBAMOL 1000 MG/10ML IJ SOLN: 500.0000 mg | Freq: Four times a day (QID) | INTRAVENOUS | Status: DC | PRN

## 2019-06-17 MED ORDER — THROMBIN (RECOMBINANT) 20000 UNITS EX SOLR
CUTANEOUS | Status: AC
  Filled 2019-06-17: qty 20000

## 2019-06-17 MED ORDER — PHENOL 1.4 % MT LIQD: 1.0000 | OROMUCOSAL | Status: DC | PRN

## 2019-06-17 MED ORDER — ACETAMINOPHEN 160 MG/5ML PO SOLN: 1000.0000 mg | Freq: Once | ORAL | Status: DC | PRN

## 2019-06-17 MED ORDER — MENTHOL 3 MG MT LOZG: 1.0000 | LOZENGE | OROMUCOSAL | Status: DC | PRN

## 2019-06-17 MED ORDER — SODIUM CHLORIDE 0.9% FLUSH
3.0000 mL | INTRAVENOUS | Status: DC | PRN
  Administered 2019-06-17: 3 mL via INTRAVENOUS
  Filled 2019-06-17: qty 3

## 2019-06-17 MED ORDER — METHOCARBAMOL 500 MG PO TABS: 500.0000 mg | ORAL_TABLET | Freq: Three times a day (TID) | ORAL | 0 refills | Status: AC | PRN

## 2019-06-17 MED ORDER — SODIUM CHLORIDE 0.9 % IV SOLN: 250.0000 mL | INTRAVENOUS | Status: DC

## 2019-06-17 MED ORDER — LEVOTHYROXINE SODIUM 75 MCG PO TABS: 75.0000 ug | ORAL_TABLET | Freq: Every day | ORAL | Status: DC

## 2019-06-17 MED ORDER — CEFAZOLIN SODIUM-DEXTROSE 2-4 GM/100ML-% IV SOLN
2.0000 g | INTRAVENOUS | Status: AC
  Administered 2019-06-17: 2 g via INTRAVENOUS
  Filled 2019-06-17: qty 100

## 2019-06-17 MED ORDER — METHOCARBAMOL 500 MG PO TABS
500.0000 mg | ORAL_TABLET | Freq: Four times a day (QID) | ORAL | Status: DC | PRN
  Administered 2019-06-17 (×2): 500 mg via ORAL
  Filled 2019-06-17: qty 1

## 2019-06-17 MED ORDER — ROCURONIUM BROMIDE 10 MG/ML (PF) SYRINGE
PREFILLED_SYRINGE | INTRAVENOUS | Status: AC
  Filled 2019-06-17: qty 10

## 2019-06-17 MED ORDER — HEMOSTATIC AGENTS (NO CHARGE) OPTIME
TOPICAL | Status: DC | PRN
  Administered 2019-06-17: 2

## 2019-06-17 MED ORDER — ONDANSETRON HCL 4 MG/2ML IJ SOLN: 4.0000 mg | Freq: Four times a day (QID) | INTRAMUSCULAR | Status: DC | PRN

## 2019-06-17 MED ORDER — 0.9 % SODIUM CHLORIDE (POUR BTL) OPTIME
TOPICAL | Status: DC | PRN
  Administered 2019-06-17: 1000 mL

## 2019-06-17 MED ORDER — LORAZEPAM 1 MG PO TABS: 1.0000 mg | ORAL_TABLET | Freq: Four times a day (QID) | ORAL | Status: DC | PRN

## 2019-06-17 MED ORDER — PHENYLEPHRINE HCL (PRESSORS) 10 MG/ML IV SOLN
INTRAVENOUS | Status: AC
  Filled 2019-06-17: qty 1

## 2019-06-17 MED ORDER — PROPOFOL 10 MG/ML IV BOLUS
INTRAVENOUS | Status: DC | PRN
  Administered 2019-06-17: 130 mg via INTRAVENOUS

## 2019-06-17 MED ORDER — OXYCODONE-ACETAMINOPHEN 10-325 MG PO TABS: 1.0000 | ORAL_TABLET | Freq: Four times a day (QID) | ORAL | 0 refills | Status: AC | PRN

## 2019-06-17 MED ORDER — METHYLPREDNISOLONE ACETATE 40 MG/ML IJ SUSP
INTRAMUSCULAR | Status: AC
  Filled 2019-06-17: qty 1

## 2019-06-17 SURGICAL SUPPLY — 57 items
AGENT HMST KT MTR STRL THRMB (HEMOSTASIS) ×2
BNDG GAUZE ELAST 4 BULKY (GAUZE/BANDAGES/DRESSINGS) ×2 IMPLANT
CANISTER SUCT 3000ML PPV (MISCELLANEOUS) ×2 IMPLANT
CLSR STERI-STRIP ANTIMIC 1/2X4 (GAUZE/BANDAGES/DRESSINGS) ×2 IMPLANT
CORD BIPOLAR FORCEPS 12FT (ELECTRODE) ×2 IMPLANT
COVER SURGICAL LIGHT HANDLE (MISCELLANEOUS) ×2 IMPLANT
COVER WAND RF STERILE (DRAPES) ×2 IMPLANT
DRAIN CHANNEL 15F RND FF W/TCR (WOUND CARE) IMPLANT
DRAPE POUCH INSTRU U-SHP 10X18 (DRAPES) ×2 IMPLANT
DRAPE SURG 17X23 STRL (DRAPES) ×2 IMPLANT
DRAPE U-SHAPE 47X51 STRL (DRAPES) ×2 IMPLANT
DRSG OPSITE POSTOP 3X4 (GAUZE/BANDAGES/DRESSINGS) ×2 IMPLANT
DRSG OPSITE POSTOP 4X6 (GAUZE/BANDAGES/DRESSINGS) ×1 IMPLANT
DURAPREP 26ML APPLICATOR (WOUND CARE) ×2 IMPLANT
ELECT BLADE 4.0 EZ CLEAN MEGAD (MISCELLANEOUS) ×2
ELECT CAUTERY BLADE 6.4 (BLADE) ×2 IMPLANT
ELECT PENCIL ROCKER SW 15FT (MISCELLANEOUS) ×2 IMPLANT
ELECT REM PT RETURN 9FT ADLT (ELECTROSURGICAL) ×2
ELECTRODE BLDE 4.0 EZ CLN MEGD (MISCELLANEOUS) IMPLANT
ELECTRODE REM PT RTRN 9FT ADLT (ELECTROSURGICAL) ×1 IMPLANT
EVACUATOR SILICONE 100CC (DRAIN) IMPLANT
GLOVE BIO SURGEON STRL SZ 6.5 (GLOVE) ×2 IMPLANT
GLOVE BIOGEL PI IND STRL 6.5 (GLOVE) ×1 IMPLANT
GLOVE BIOGEL PI IND STRL 8.5 (GLOVE) ×1 IMPLANT
GLOVE BIOGEL PI INDICATOR 6.5 (GLOVE) ×1
GLOVE BIOGEL PI INDICATOR 8.5 (GLOVE) ×1
GLOVE SS BIOGEL STRL SZ 8.5 (GLOVE) ×1 IMPLANT
GLOVE SUPERSENSE BIOGEL SZ 8.5 (GLOVE) ×1
GOWN STRL REUS W/ TWL LRG LVL3 (GOWN DISPOSABLE) ×2 IMPLANT
GOWN STRL REUS W/TWL 2XL LVL3 (GOWN DISPOSABLE) ×3 IMPLANT
GOWN STRL REUS W/TWL LRG LVL3 (GOWN DISPOSABLE) ×4
KIT BASIN OR (CUSTOM PROCEDURE TRAY) ×2 IMPLANT
KIT TURNOVER KIT B (KITS) ×2 IMPLANT
NDL SPNL 18GX3.5 QUINCKE PK (NEEDLE) ×2 IMPLANT
NEEDLE 22X1 1/2 (OR ONLY) (NEEDLE) ×2 IMPLANT
NEEDLE SPNL 18GX3.5 QUINCKE PK (NEEDLE) ×4 IMPLANT
NS IRRIG 1000ML POUR BTL (IV SOLUTION) ×2 IMPLANT
PACK LAMINECTOMY ORTHO (CUSTOM PROCEDURE TRAY) ×2 IMPLANT
PACK UNIVERSAL I (CUSTOM PROCEDURE TRAY) ×2 IMPLANT
PAD ARMBOARD 7.5X6 YLW CONV (MISCELLANEOUS) ×4 IMPLANT
PATTIES SURGICAL .5 X.5 (GAUZE/BANDAGES/DRESSINGS) ×2 IMPLANT
PATTIES SURGICAL .5 X1 (DISPOSABLE) ×2 IMPLANT
SPONGE SURGIFOAM ABS GEL 100 (HEMOSTASIS) ×1 IMPLANT
SURGIFLO W/THROMBIN 8M KIT (HEMOSTASIS) ×2 IMPLANT
SUT BONE WAX W31G (SUTURE) ×2 IMPLANT
SUT MON AB 3-0 SH 27 (SUTURE) ×2
SUT MON AB 3-0 SH27 (SUTURE) ×1 IMPLANT
SUT VIC AB 0 CT1 27 (SUTURE)
SUT VIC AB 0 CT1 27XBRD ANBCTR (SUTURE) IMPLANT
SUT VIC AB 1 CT1 18XCR BRD 8 (SUTURE) ×1 IMPLANT
SUT VIC AB 1 CT1 8-18 (SUTURE) ×2
SUT VIC AB 2-0 CT1 18 (SUTURE) ×3 IMPLANT
SYR BULB IRRIGATION 50ML (SYRINGE) ×2 IMPLANT
SYR CONTROL 10ML LL (SYRINGE) ×2 IMPLANT
TOWEL GREEN STERILE (TOWEL DISPOSABLE) ×2 IMPLANT
TOWEL GREEN STERILE FF (TOWEL DISPOSABLE) ×2 IMPLANT
WATER STERILE IRR 1000ML POUR (IV SOLUTION) ×2 IMPLANT

## 2019-06-17 NOTE — H&P (Signed)
Addendum H&P  No change in patient's clinical exam from last office visit of 06/09/2019.  Plan on moving forward with lumbar decompression discectomy for large right L2-3 disc herniation with right L2/3 radiculopathy.  I have reviewed the surgical procedure as well as the risks and benefits again with the patient this morning and he has expressed an understanding.  All of his questions were encouraged and addressed.

## 2019-06-17 NOTE — Brief Op Note (Signed)
06/17/2019  11:19 AM  PATIENT:  [name]  68 y.o. male  PRE-OPERATIVE DIAGNOSIS:  L2-3 Right herniated disc  POST-OPERATIVE DIAGNOSIS:  L2-3 Right herniated disc  PROCEDURE:  Procedure(s) with comments: Lumbar two -Lumbar three decompression and disectomy (N/A) - 3 hrs  SURGEON:  Surgeon(s) and Role:    Melina Schools, MD - Primary  PHYSICIAN ASSISTANT:   ASSISTANTS: none   ANESTHESIA:   general  EBL:  25 mL   BLOOD ADMINISTERED:none  DRAINS: none   LOCAL MEDICATIONS USED:  MARCAINE    and OTHER depomedrol  SPECIMEN:  No Specimen  DISPOSITION OF SPECIMEN:  N/A  COUNTS:  YES  TOURNIQUET:  * No tourniquets in log *  DICTATION: .Dragon Dictation  PLAN OF CARE: Admit for overnight observation  PATIENT DISPOSITION:  PACU - hemodynamically stable.

## 2019-06-17 NOTE — Discharge Instructions (Signed)

## 2019-06-17 NOTE — OR Nursing (Signed)
Per protocol Dr. Derrel Nip called at 09:39 on June 17, 2019 and confirmed the tip of instrumentation was at L2-L3.

## 2019-06-17 NOTE — Op Note (Signed)
Operative report  Preoperative diagnosis: Right posterior lateral L2-3 disc herniation with L3 radiculopathy  Postoperative diagnosis: Same  Operative procedure: Right L3 hemilaminotomy with medial facetectomy and excision of disc herniation.  Complications: None  Intraoperative findings: Large posterior lateral disc herniation consistent with what was seen on the preoperative MRI.  Removed multiple fragments of disc material.  Majority of the material was posterior to the L3 vertebral body causing marked compression of the L3 nerve root.  No CSF leak noted at the conclusion of the case.  Indications: [name] is a very pleasant 68 year old gentleman who presented with significant right radicular leg pain, numbness, and weakness in the L3 dermatome.  Imaging studies demonstrated a large posterior lateral disc herniation at L2-3 that had migrated behind the body of L3.  The fragment was very large and cause significant compression of the L3 nerve root.  As a result of the neurological deficits and the severe pain we elected to proceed with surgery.  Preoperative medical clearance was obtained by his cardiologist and primary care physician.  Operative report: Patient was brought the operating room placed upon the operating room table.  After successful induction of general anesthesia and endotracheal intubation teds SCDs were applied and he was turned prone onto the Wilson frame.  All bony prominences were well-padded and the back was then prepped and draped in a standard fashion.  Timeout was taken to confirm patient procedure and all other important data.  2 needles were placed into the back and I took an x-ray to confirm incision site location.  I marked my incision site and infiltrated with quarter percent Marcaine with epinephrine.  Midline incision was made and sharp dissection was carried out down to the deep fascia.  The deep fascia was sharply incised and I stripped the paraspinal muscles in the  right side to expose the L3 and L2 to spinous process and lamina.  I took my dissection out to the lateral aspect of the facet.  Care was taken not to violate the facet capsule.  A Penfield 4 was placed underneath the L2 lamina and a second x-ray was taken confirming I was at the appropriate level.  Retractor system was then placed and I could clearly see the posterior lateral aspect of the right side of the L2-3 disc space.  Laminotomy was performed of L2 with a 2 and 3 mm Kerrison punch.  I then dissected gently through the ligamentum flavum with the Penfield 4 and then used my 2 mm Kerrison rondure to remove the ligamentum flavum and exposed the dorsal surface of the thecal sac.  I then began gently dissecting into the lateral recess with my Penfield 4.  The L3 nerve root came into the view.  It was significantly compressed and under a marked amount of tension.  With careful dissection I was able to develop a plane between the medial facet and the L3 nerve root.  I then used my 2 mm Kerrison rongeurs to take down the medial portion of the L2 facet.  At this point I then worked down until I could get into the level of the L3 pedicle.  At this point I could now clearly visualize the space between the L3 nerve root and the underlying disc herniation.  Working more lateral I was able to dissect the epidural veins and expose the annulus.  Using my Penfield 4 and I was able to form an annulotomy.  Using nerve hooks I mobilized the disc fragments and remove them  with the micropituitary rongeurs.  Multiple fragments of disc material were freely mobile and able to be removed.  As I removed more more of the disc fragment the L3 nerve root became much more relaxed and able to be retracted.  At this point I placed a patty over the L3 nerve root and used it to retract it.  I continue removing large fragments of disc material.  I continue to work inferiorly behind the body of L3 with my long nerve hook mobilizing and  removing fragments of disc material.  Based on the preoperative MRI I removed large fragments of disc material from behind the body of L3 which is consistent with the preoperative MRI.  The amount of disc material removed was also consistent with the size of the herniation on MRI.  At this point the L3 nerve root was now freely mobile.  I could easily palpate underneath the nerve root out into the L3 foramen.  I was able to easily visualize the medial aspect of the L3 pedicle now that I could retract the nerve root.  At this point I worked superiorly until I got to the level of the L2-3 disc space.  I gently began removing the small fragments of disc material at the level of the disc space.  At this point time I could easily pass my [facility] elevator superiorly in the lateral recess and out the L2 foramen centrally.  In addition I could freely palpate along the posterior aspect of the L3 vertebral body confirming I had removed all of the disc material.  I could easily mobilize the L3 nerve root and I could pass my [facility] out the L3 foramen without any compression.  At this point I was very pleased with the overall decompression/discectomy.  At this point I irrigated the wound copiously with normal saline.  I used TXA soaked Gelfoam padding along with FloSeal to obtain hemostasis.  I then irrigated again after confirming hemostasis.  I took one last pass with my nerve hook confirming adequate decompression and no retained fragments of disc material.  I then placed approximately 20 mg (1/2 cc) of Depo-Medrol over the L3 nerve root in order to aid in postoperative analgesia.  I then placed a TXA soaked Gelfoam patty over the laminotomy site.  I remove the retractors and then closed the deep fascia with interrupted #1 Vicryl suture.  I then used a 2-0 Vicryl suture, and then 3-0 Monocryl for the skin.  Steri-Strips and a dry dressing were applied.  The end of the case all needle sponge counts were correct.   The patient was ultimately extubated transfer the PACU without incident.

## 2019-06-17 NOTE — Anesthesia Procedure Notes (Signed)
Procedure Name: Intubation Date/Time: 06/17/2019 8:40 AM Performed by: Eulas Post, Vestal Crandall W, CRNA Pre-anesthesia Checklist: Patient identified, Emergency Drugs available, Suction available and Patient being monitored Patient Re-evaluated:Patient Re-evaluated prior to induction Oxygen Delivery Method: Circle system utilized Preoxygenation: Pre-oxygenation with 100% oxygen Induction Type: IV induction Ventilation: Mask ventilation without difficulty Laryngoscope Size: Miller and 2 Grade View: Grade I Tube type: Oral Tube size: 7.5 mm Number of attempts: 1 Airway Equipment and Method: Stylet and Oral airway Placement Confirmation: ETT inserted through vocal cords under direct vision,  positive ETCO2 and breath sounds checked- equal and bilateral Secured at: 22 cm Tube secured with: Tape Dental Injury: Teeth and Oropharynx as per pre-operative assessment

## 2019-06-17 NOTE — Plan of Care (Signed)
  Problem: Safety: Goal: Ability to remain free from injury will improve Outcome: Progressing   Problem: Education: Goal: Ability to verbalize activity precautions or restrictions will improve Outcome: Progressing Goal: Knowledge of the prescribed therapeutic regimen will improve Outcome: Progressing Goal: Understanding of discharge needs will improve Outcome: Progressing   Problem: Activity: Goal: Ability to avoid complications of mobility impairment will improve Outcome: Progressing Goal: Ability to tolerate increased activity will improve Outcome: Progressing Goal: Will remain free from falls Outcome: Progressing   Problem: Bowel/Gastric: Goal: Gastrointestinal status for postoperative course will improve Outcome: Progressing   Problem: Clinical Measurements: Goal: Ability to maintain clinical measurements within normal limits will improve Outcome: Progressing Goal: Postoperative complications will be avoided or minimized Outcome: Progressing Goal: Diagnostic test results will improve Outcome: Progressing   Problem: Pain Management: Goal: Pain level will decrease Outcome: Progressing   Problem: Skin Integrity: Goal: Will show signs of wound healing Outcome: Progressing   Problem: Health Behavior/Discharge Planning: Goal: Identification of resources available to assist in meeting health care needs will improve Outcome: Progressing   Problem: Bladder/Genitourinary: Goal: Urinary functional status for postoperative course will improve Outcome: Progressing   Ival Bible, BSN, RN

## 2019-06-17 NOTE — Transfer of Care (Signed)
Immediate Anesthesia Transfer of Care Note  Patient: [name]  Procedure(s) Performed: Lumbar two -Lumbar three decompression and disectomy (N/A Spine Lumbar)  Patient Location: PACU  Anesthesia Type:General  Level of Consciousness: awake and drowsy  Airway & Oxygen Therapy: Patient Spontanous Breathing and Patient connected to nasal cannula oxygen  Post-op Assessment: Report given to RN and Post -op Vital signs reviewed and stable  Post vital signs: Reviewed and stable  Last Vitals:  Vitals Value Taken Time  BP 133/72 06/17/19 1118  Temp    Pulse 62 06/17/19 1120  Resp 13 06/17/19 1120  SpO2 96 % 06/17/19 1120  Vitals shown include unvalidated device data.  Last Pain:  Vitals:   06/17/19 0707  TempSrc: Oral  PainSc:       Patients Stated Pain Goal: 3 ([id number])  Complications: No apparent anesthesia complications

## 2019-06-18 ENCOUNTER — Encounter (HOSPITAL_COMMUNITY): Payer: Self-pay | Admitting: Orthopedic Surgery

## 2019-06-18 DIAGNOSIS — Z8673 Personal history of transient ischemic attack (TIA), and cerebral infarction without residual deficits: Secondary | ICD-10-CM | POA: Diagnosis not present

## 2019-06-18 DIAGNOSIS — M5116 Intervertebral disc disorders with radiculopathy, lumbar region: Secondary | ICD-10-CM | POA: Diagnosis not present

## 2019-06-18 DIAGNOSIS — Z87891 Personal history of nicotine dependence: Secondary | ICD-10-CM | POA: Diagnosis not present

## 2019-06-18 DIAGNOSIS — I10 Essential (primary) hypertension: Secondary | ICD-10-CM | POA: Diagnosis not present

## 2019-06-18 DIAGNOSIS — J449 Chronic obstructive pulmonary disease, unspecified: Secondary | ICD-10-CM | POA: Diagnosis not present

## 2019-06-18 DIAGNOSIS — Z951 Presence of aortocoronary bypass graft: Secondary | ICD-10-CM | POA: Diagnosis not present

## 2019-06-18 DIAGNOSIS — K219 Gastro-esophageal reflux disease without esophagitis: Secondary | ICD-10-CM | POA: Diagnosis not present

## 2019-06-18 DIAGNOSIS — Z955 Presence of coronary angioplasty implant and graft: Secondary | ICD-10-CM | POA: Diagnosis not present

## 2019-06-18 DIAGNOSIS — I2510 Atherosclerotic heart disease of native coronary artery without angina pectoris: Secondary | ICD-10-CM | POA: Diagnosis not present

## 2019-06-18 MED ORDER — SODIUM CHLORIDE 0.9 % IV BOLUS
500.0000 mL | Freq: Once | INTRAVENOUS | Status: AC
  Administered 2019-06-18: 500 mL via INTRAVENOUS

## 2019-06-18 MED FILL — ONDANSETRON HCL 4 MG TABLET: 4 | 6 days supply | Qty: 20 | Fill #0

## 2019-06-18 MED FILL — DEXTROAMPH TB 30MG NSTR 100: 30 | 90 days supply | Qty: 270 | Fill #0

## 2019-06-18 MED FILL — METHOCARBAMOL 500 MG TABS: 500 | 5 days supply | Qty: 15 | Fill #0

## 2019-06-18 MED FILL — OXYCODONE-APAP 10-325: 10-325 | 5 days supply | Qty: 20 | Fill #0

## 2019-06-18 MED FILL — Thrombin (Recombinant) For Soln 20000 Unit: CUTANEOUS | Qty: 1 | Status: AC

## 2019-06-18 NOTE — Progress Notes (Signed)
Per Elizabeth Sauer, admin 500cc NS bolus. Collaboration is appreciated.

## 2019-06-18 NOTE — Progress Notes (Signed)
MD paged for medication reconciliation for pt discharge.

## 2019-06-18 NOTE — Progress Notes (Signed)
Dr. Rolena Infante paged pertaining to pt's BP 82/66, pt asymptomatic. Will conti to monitor.

## 2019-06-18 NOTE — Progress Notes (Signed)
Paged MD via [facility] to request medication reconciliation for discharge. Staff states "Epic is down right now but we will let him know."

## 2019-06-18 NOTE — TOC Transition Note (Signed)
Transition of Care [facility]) - CM/SW Discharge Note   Patient Details  Name: [name] MRN: [id number] Date of Birth: 03-31-1951  Transition of Care [facility]) CM/SW Contact:  Pollie Friar, RN Phone Number: 06/18/2019, 1:51 PM   Clinical Narrative:    Pt is discharging home with self care. No f/u per PT/OT. OT recommending 3 in 1 but pt refused.  Pt has hospital f/u and transportation home.     Final next level of care: Home/Self Care Barriers to Discharge: No Barriers Identified   Patient Goals and CMS Choice        Discharge Placement                       Discharge Plan and Services                                     Social Determinants of Health (SDOH) Interventions     Readmission Risk Interventions No flowsheet data found.

## 2019-06-18 NOTE — Progress Notes (Signed)
1:68 RN called MD [facility] office because RN cant print AVS due to medicines not be reconciled, and we are not able to to D/C the pt. This is the 3rd attempt of calling the office.

## 2019-06-18 NOTE — Plan of Care (Signed)
  Problem: Education: Goal: Ability to verbalize activity precautions or restrictions will improve Outcome: Progressing   Problem: Pain Management: Goal: Pain level will decrease Outcome: Progressing   Problem: Health Behavior/Discharge Planning: Goal: Identification of resources available to assist in meeting health care needs will improve Outcome: Progressing

## 2019-06-18 NOTE — Progress Notes (Signed)
No foley catheter in place at start of this shift. Pt voiding

## 2019-06-18 NOTE — Progress Notes (Signed)
Physical Therapy Evaluation and Discharge Patient Details Name: [name] MRN: [id number] DOB: 1951/01/20 Today's Date: 06/18/2019   History of Present Illness  Pt is a 68 y/o male with large R L2-3 disc herniation/L3 radiculopathy now s/p Right L3 hemilaminotomy with medial facetectomy and excision of disc herniation. PMHx includes anxiety, arthritis, CAD, COPD, depression, recent dx of prostate cancer (04/2019).   Clinical Impression  Pt is up to walk with PT and had applied brace out of sight of PT.  Reviewed tightening the pulls on the brace and how to remove to find them, as well as physically practicing transfers and bed mob.  Pt is able to recall the three precautions at end of session.  Follow up with surgeon for therapy when pt is appropriately healed.  Talked with him about obtaining a portable bed rail at local supplier, and for now is ready to DC PT.    Follow Up Recommendations Supervision for mobility/OOB    Equipment Recommendations  None recommended by PT    Recommendations for Other Services       Precautions / Restrictions Precautions Precautions: Fall;Back Precaution Booklet Issued: Yes (comment) Precaution Comments: reviewed precautions mult times Required Braces or Orthoses: Spinal Brace Spinal Brace: Applied in sitting position;Lumbar corset(reviewed application) Restrictions Weight Bearing Restrictions: No      Mobility  Bed Mobility Overal bed mobility: Needs Assistance Bed Mobility: Sidelying to Sit;Sit to Sidelying   Sidelying to sit: Supervision     Sit to sidelying: Supervision General bed mobility comments: on side of bed when PT arrived but reviewed precautions  Transfers Overall transfer level: Needs assistance Equipment used: 4-wheeled walker Transfers: Sit to/from Stand Sit to Stand: Supervision         General transfer comment: reminders for use of brace to assist this task  Ambulation/Gait Ambulation/Gait assistance: Min  guard(for safety) Gait Distance (Feet): 200 Feet Assistive device: 4-wheeled walker Gait Pattern/deviations: Step-through pattern;Decreased stride length;Wide base of support Gait velocity: reduced Gait velocity interpretation: <1.31 ft/sec, indicative of household ambulator General Gait Details: pt is up to walk on RW with brace in place, had reminders for safety on walker, not to back up to prepare to sit  Stairs Stairs: Yes Stairs assistance: Min guard(for safety) Stair Management: Two rails;Alternating pattern;Forwards Number of Stairs: 30 General stair comments: pt reviewed stairs on practice set in rehab gym with mult trips to gradually smooth out the performance  Wheelchair Mobility    Modified Rankin (Stroke Patients Only)       Balance Overall balance assessment: Needs assistance Sitting-balance support: Feet supported Sitting balance-Leahy Scale: Good     Standing balance support: Bilateral upper extremity supported;During functional activity Standing balance-Leahy Scale: Fair Standing balance comment: pt is getting up to walk with rollator, but with brace secured better he is able to walk a few steps with better control                              Pertinent Vitals/Pain Pain Assessment: Faces Faces Pain Scale: Hurts a little bit Pain Location: surgery soreness Pain Descriptors / Indicators: Sore Pain Intervention(s): Monitored during session;Premedicated before session;Repositioned    Home Living Family/patient expects to be discharged to:: Private residence Living Arrangements: Spouse/significant other Available Help at Discharge: Family Type of Home: House Home Access: Stairs to enter Entrance Stairs-Rails: Right;Left;Can reach both Technical brewer of Steps: 4 Home Layout: Multi-level Home Equipment: Environmental consultant - 4 wheels;Cane - single  point      Prior Function Level of Independence: Independent               Hand Dominance    Dominant Hand: Right    Extremity/Trunk Assessment   Upper Extremity Assessment Upper Extremity Assessment: Overall WFL for tasks assessed    Lower Extremity Assessment Lower Extremity Assessment: Generalized weakness    Cervical / Trunk Assessment Cervical / Trunk Assessment: Other exceptions Cervical / Trunk Exceptions: s/p lumbar sx  Communication   Communication: No difficulties  Cognition Arousal/Alertness: Awake/alert Behavior During Therapy: WFL for tasks assessed/performed Overall Cognitive Status: Within Functional Limits for tasks assessed                                        General Comments General comments (skin integrity, edema, etc.): pt was able to walk on both hallway and stairs with good control but not as secure on no AD.  Has a SPC and Rollator at home, talked with him about recovering his LE strength in a few weeks when MD has approved outpatient therapy    Exercises     Assessment/Plan    PT Assessment Patent does not need any further PT services;All further PT needs can be met in the next venue of care(when MD approved PT later)  PT Problem List Decreased strength;Decreased range of motion;Decreased activity tolerance;Decreased balance;Decreased mobility;Decreased coordination;Decreased safety awareness;Decreased knowledge of use of DME;Decreased knowledge of precautions;Decreased skin integrity;Pain       PT Treatment Interventions      PT Goals (Current goals can be found in the Care Plan section)  Acute Rehab PT Goals Patient Stated Goal: home today, to his spouse, dogs PT Goal Formulation: With patient Time For Goal Achievement: 06/25/19 Potential to Achieve Goals: Good    Frequency     Barriers to discharge        Co-evaluation               AM-PAC PT "6 Clicks" Mobility  Outcome Measure Help needed turning from your back to your side while in a flat bed without using bedrails?: None Help needed moving from  lying on your back to sitting on the side of a flat bed without using bedrails?: None Help needed moving to and from a bed to a chair (including a wheelchair)?: A Little Help needed standing up from a chair using your arms (e.g., wheelchair or bedside chair)?: A Little Help needed to walk in hospital room?: A Little Help needed climbing 3-5 steps with a railing? : A Little 6 Click Score: 20    End of Session Equipment Utilized During Treatment: Gait belt Activity Tolerance: Patient tolerated treatment well;Treatment limited secondary to medical complications (Comment) Patient left: in bed;with call bell/phone within reach;Other (comment)(sitting side of bed) Nurse Communication: Mobility status(reviewed dc planning) PT Visit Diagnosis: Unsteadiness on feet (R26.81);Muscle weakness (generalized) (M62.81);Ataxic gait (R26.0)    Time: 9211-9417 PT Time Calculation (min) (ACUTE ONLY): 27 min   Charges:   PT Evaluation $PT Eval Moderate Complexity: 1 Mod PT Treatments $Gait Training: 8-22 mins       Ramond Dial 06/18/2019, 12:11 PM   Mee Hives, PT MS Acute Rehab Dept. Number: [place] and [place]

## 2019-06-18 NOTE — Evaluation (Signed)
Occupational Therapy Evaluation and Discharge Patient Details Name: [name] MRN: [id number] DOB: 01-16-1951 Today's Date: 06/18/2019    History of Present Illness Pt is a 68 y/o male with large R L2-3 disc herniation/L3 radiculopathy now s/p Right L3 hemilaminotomy with medial facetectomy and excision of disc herniation. PMHx includes anxiety, arthritis, CAD, COPD, depression, recent dx of prostate cancer (04/2019).    Clinical Impression   This 68 y/o male presents with the above. PTA pt reports independence with ADL and functional mobility. Pt completing functional mobility without AD in room at overall minguard assist; currently requiring minguard assist for LB ADL, setup assist for UB ADL. Pt reports plans to return home with spouse who can assist with ADL/iADL PRN. Educated pt re: back precautions, safety and compensatory techniques for completing ADL and functional transfers while maintaining with pt verbalizing understanding. Questions answered throughout with no further acute OT needs identified. Pt reports feeling comfortable completing ADL/mobility tasks given available spouse assist. Acute OT to sign off, thank you for this referral.     Follow Up Recommendations  No OT follow up;Supervision - Intermittent    Equipment Recommendations  3 in 1 bedside commode(for use in shower)           Precautions / Restrictions Precautions Precautions: Fall;Back Precaution Booklet Issued: Yes (comment) Precaution Comments: issued and reviewed with pt Required Braces or Orthoses: Spinal Brace Spinal Brace: Lumbar corset;Applied in sitting position Restrictions Weight Bearing Restrictions: No      Mobility Bed Mobility Overal bed mobility: Needs Assistance Bed Mobility: Sidelying to Sit   Sidelying to sit: Supervision       General bed mobility comments: for safety, use of bedrail; verabally reviewed log roll technique  Transfers Overall transfer level: Needs  assistance Equipment used: None Transfers: Sit to/from Stand Sit to Stand: Supervision         General transfer comment: for safety and balance    Balance Overall balance assessment: Mild deficits observed, not formally tested                                         ADL either performed or assessed with clinical judgement   ADL Overall ADL's : Needs assistance/impaired Eating/Feeding: Independent;Sitting   Grooming: Min guard;Standing   Upper Body Bathing: Set up;Sitting   Lower Body Bathing: Min guard;Sit to/from stand Lower Body Bathing Details (indicate cue type and reason): educated on option/benefits of performing LB bathing from sit<>stand level (vs standing alone) Upper Body Dressing : Set up;Sitting;Standing Upper Body Dressing Details (indicate cue type and reason): pt able to don/manage back brace without difficulty Lower Body Dressing: Min guard;Sit to/from stand Lower Body Dressing Details (indicate cue type and reason): pt with shorts donned upon arrival Toilet Transfer: Min guard;Ambulation Toilet Transfer Details (indicate cue type and reason): simulated via transfer to/from EOB Toileting- Clothing Manipulation and Hygiene: Min guard;Sit to/from Nurse, children's Details (indicate cue type and reason): educated on use of 3:1 as shower seat Functional mobility during ADLs: Min guard General ADL Comments: pt with mild unsteadiness and pain     Vision         Perception     Praxis      Pertinent Vitals/Pain Pain Assessment: Faces Faces Pain Scale: Hurts a little bit Pain Location: incisional Pain Descriptors / Indicators: Discomfort;Sore Pain Intervention(s): Repositioned;Monitored during session;Premedicated before  session     Hand Dominance     Extremity/Trunk Assessment Upper Extremity Assessment Upper Extremity Assessment: Overall WFL for tasks assessed   Lower Extremity Assessment Lower Extremity Assessment:  Defer to PT evaluation   Cervical / Trunk Assessment Cervical / Trunk Assessment: Other exceptions Cervical / Trunk Exceptions: s/p lumbar sx   Communication Communication Communication: No difficulties   Cognition Arousal/Alertness: Awake/alert Behavior During Therapy: WFL for tasks assessed/performed Overall Cognitive Status: Within Functional Limits for tasks assessed                                 General Comments: pt tearful towards end of session re: recent cancer diagnosis   General Comments       Exercises     Shoulder Instructions      Home Living Family/patient expects to be discharged to:: Private residence Living Arrangements: Spouse/significant other Available Help at Discharge: Family Type of Home: House Home Access: Stairs to enter Technical brewer of Steps: 4 Entrance Stairs-Rails: Right;Left;Can reach both(when entering garage) Home Layout: Multi-level Alternate Level Stairs-Number of Steps: 4 from den to [employer], 12 up to bedroom Alternate Level Stairs-Rails: Right;Left;Can reach both(with 12 steps up to bedroom) Bathroom Shower/Tub: Teacher, early years/pre: Standard(one handicapped height, 2 standard)     Home Equipment: Walker - 4 wheels          Prior Functioning/Environment Level of Independence: Independent                 OT Problem List: Decreased strength;Decreased range of motion;Decreased knowledge of use of DME or AE;Decreased knowledge of precautions;Pain;Impaired balance (sitting and/or standing);Decreased activity tolerance      OT Treatment/Interventions:      OT Goals(Current goals can be found in the care plan section) Acute Rehab OT Goals Patient Stated Goal: home today, to his spouse, dogs OT Goal Formulation: All assessment and education complete, DC therapy  OT Frequency:     Barriers to D/C:            Co-evaluation              AM-PAC OT "6 Clicks" Daily Activity      Outcome Measure Help from another person eating meals?: None Help from another person taking care of personal grooming?: None Help from another person toileting, which includes using toliet, bedpan, or urinal?: None Help from another person bathing (including washing, rinsing, drying)?: A Little Help from another person to put on and taking off regular upper body clothing?: None Help from another person to put on and taking off regular lower body clothing?: A Little 6 Click Score: 22   End of Session Equipment Utilized During Treatment: Back brace Nurse Communication: Mobility status  Activity Tolerance: Patient tolerated treatment well Patient left: in bed;with call bell/phone within reach  OT Visit Diagnosis: Other abnormalities of gait and mobility (R26.89);Pain Pain - part of body: (back)                Time: [id number] OT Time Calculation (min): 20 min Charges:  OT General Charges $OT Visit: 1 Visit OT Evaluation $OT Eval Low Complexity: 1 Low   Lou Cal, OT [facility] Pager [phone] Office [phone]  Raymondo Band 06/18/2019, 9:45 AM

## 2019-06-18 NOTE — H&P (Signed)
    Subjective: Procedure(s) (LRB): Lumbar two -Lumbar three decompression and disectomy (N/A) 1 Day Post-Op  Patient reports pain as 2 on 0-10 scale.  Reports none leg pain reports incisional back pain   Positive void Negative bowel movement Positive flatus Negative chest pain or shortness of breath  Objective: Vital signs in last 24 hours: Temp:  [97.5 F (36.4 C)-98.4 F (36.9 C)] 98.4 F (36.9 C) (09/17 0747) Pulse Rate:  [58-89] 70 (09/17 0747) Resp:  [10-21] 16 (09/17 0747) BP: (82-146)/(59-86) 146/79 (09/17 0747) SpO2:  [94 %-100 %] 94 % (09/17 0747)  Intake/Output from previous day: 09/16 0701 - 09/17 0700 In: 1250 [I.V.:700; IV Piggyback:550] Out: 525 [Urine:500; Blood:25]  Labs: No results for input(s): WBC, RBC, HCT, PLT in the last 72 hours. No results for input(s): NA, K, CL, CO2, BUN, CREATININE, GLUCOSE, CALCIUM in the last 72 hours. No results for input(s): LABPT, INR in the last 72 hours.  Physical Exam: Neurologically intact ABD soft Intact pulses distally Incision: dressing C/D/I and no drainage Compartment soft negative radicular leg pain Body mass index is 23.65 kg/m.   Assessment/Plan: Patient stable  xrays n/a Continue mobilization with physical therapy Continue care  Advance diet Up with therapy  1. Doing well overall 2. Plan on d/c to home after cleared by PT  Melina Schools, MD [facility] [phone],

## 2019-06-19 ENCOUNTER — Telehealth: Payer: Self-pay | Admitting: Internal Medicine

## 2019-06-19 DIAGNOSIS — J029 Acute pharyngitis, unspecified: Secondary | ICD-10-CM | POA: Diagnosis not present

## 2019-06-19 DIAGNOSIS — R4702 Dysphasia: Secondary | ICD-10-CM | POA: Diagnosis not present

## 2019-06-19 MED FILL — DUKE'S MOUTHWASH: 12 days supply | Qty: 473 | Fill #0

## 2019-06-19 NOTE — Anesthesia Postprocedure Evaluation (Signed)
Anesthesia Post Note  Patient: [name]  Procedure(s) Performed: Lumbar two -Lumbar three decompression and disectomy (N/A Spine Lumbar)     Patient location during evaluation: PACU Anesthesia Type: General Level of consciousness: awake and alert Pain management: pain level controlled Vital Signs Assessment: post-procedure vital signs reviewed and stable Respiratory status: spontaneous breathing, nonlabored ventilation, respiratory function stable and patient connected to nasal cannula oxygen Cardiovascular status: blood pressure returned to baseline and stable Postop Assessment: no apparent nausea or vomiting Anesthetic complications: no    Last Vitals:  Vitals:   06/18/19 0747 06/18/19 1124  BP: (!) 146/79 (!) 177/83  Pulse: 70 68  Resp: 16 20  Temp: 36.9 C 36.8 C  SpO2: 94% 99%    Last Pain:  Vitals:   06/18/19 1124  TempSrc: Oral  PainSc:                  [name]

## 2019-06-19 NOTE — Telephone Encounter (Addendum)
Pt called and said that he had surgery on Wednesday 06/17/19 and that he has developed Thrush in his throat, he wanted to know what you thought he should do for it. He wants to figure out something before the weekend, He said to call and speak to his wife [phone]

## 2019-06-19 NOTE — Telephone Encounter (Signed)
Please call wife and see what symptoms he has. Is it a white plaque inside mouth or just a sore mouth?

## 2019-06-19 NOTE — Telephone Encounter (Signed)
He said it is white in the back of his throat and his throat is sore.

## 2019-06-22 MED FILL — NYSTATIN 100,000 UNITS/ML S: 100000 | 10 days supply | Qty: 200 | Fill #0

## 2019-06-25 MED FILL — TRINTELLIX 5 MG TABLET: 5 | 30 days supply | Qty: 90 | Fill #4

## 2019-06-26 NOTE — Discharge Summary (Signed)
Patient ID: [name] MRN: [id number] DOB/AGE: July 27, 1951 68 y.o.  Admit date: 06/17/2019 Discharge date: 06/26/2019  Admission Diagnoses:  Active Problems:   Lumbar disc herniation   Discharge Diagnoses:  Active Problems:   Lumbar disc herniation  status post Procedure(s): Lumbar two -Lumbar three decompression and disectomy  Past Medical History:  Diagnosis Date  . Anxiety   . Arthritis    "NECK, HANDS, KNEES" (06/14/2016)  . CAD (coronary artery disease)   . Carotid artery stenosis   . Complication of anesthesia    Reports that he had trouble swallowing post anesthesia- CABG  . COPD (chronic obstructive pulmonary disease) ([place]) 12/11/2011   r/s mv - EF 72%; exercise capacity 13 METS; no exercised induced ischemic EKG changes  . Depression, major   . Dyspnea    with exertion  . GERD (gastroesophageal reflux disease)   . HTN (hypertension), benign 04/16/2008   echo - EF >55%; no regional wall or valvular abnormalities  . Hyperlipidemia    statin intolerant  . Hypothyroidism   . Migraine    "crippling headaches as a kid; silent migraines now, no pain, couple times/week" (06/14/2016)  . Prostate cancer ([place]) 04/2019   recent dx - Dr Lawerance Bach  . S/P angioplasty with stent 06/14/16 PCI & DES of high grade AV groove LCX and 1st OM  06/15/2016  . Squamous cell cancer of tongue ([place]) ~ 2002   "35 radiation treatments at [facility]  . Subclavian steal syndrome 06/17/2012   carotid doppler - R systolic brachial pressure 152mmHg, L 160mmHg; R subclavian artery - proxmial obsstruction w/ abnormal monophasic waveforms, R ECA known occlusive disease; L ECA narrowing w/ 70-99% diameter reductiona  . TIA (transient ischemic attack) 2005  . Viral hepatitis 1970s   "non specific"    Surgeries: Procedure(s): Lumbar two -Lumbar three decompression and disectomy on 06/17/2019   Consultants:   Discharged Condition: Improved  Hospital Course: [name] is an 68 y.o. male  who was admitted 06/17/2019 for operative treatment of large right L2-3 disc herniation with right L2/3 radiculopathy. Patient failed conservative treatments (please see the history and physical for the specifics) and had severe unremitting pain that affects sleep, daily activities and work/hobbies. After pre-op clearance, the patient was taken to the operating room on 06/17/2019 and underwent  Procedure(s): Lumbar two -Lumbar three decompression and disectomy.    Patient was given perioperative antibiotics:  Anti-infectives (From admission, onward)   Start     Dose/Rate Route Frequency Ordered Stop   06/17/19 1630  ceFAZolin (ANCEF) IVPB 1 g/50 mL premix     1 g 100 mL/hr over 30 Minutes Intravenous Every 8 hours 06/17/19 1552 06/18/19 0027   06/17/19 0644  ceFAZolin (ANCEF) IVPB 2g/100 mL premix     2 g 200 mL/hr over 30 Minutes Intravenous 30 min pre-op 06/17/19 0644 06/17/19 0843       Patient was given sequential compression devices and early ambulation to prevent DVT.   Patient benefited maximally from hospital stay and there were no complications. At the time of discharge, the patient was urinating/moving their bowels without difficulty, tolerating a regular diet, pain is controlled with oral pain medications and they have been cleared by PT/OT.   Recent vital signs: No data found.   Recent laboratory studies: No results for input(s): WBC, HGB, HCT, PLT, NA, K, CL, CO2, BUN, CREATININE, GLUCOSE, INR, CALCIUM in the last 72 hours.  Invalid input(s): PT, 2   Discharge Medications:  Allergies as of 06/18/2019      Reactions   Crestor [rosuvastatin Calcium] Other (See Comments)   Difficulty swallowing   Tramadol Shortness Of Breath   Lisinopril Cough   Gabapentin Other (See Comments)   Hallucinations   Tricor [fenofibrate] Other (See Comments)   UNSPECIFIED REACTION   Lipitor [atorvastatin Calcium] Other (See Comments)   Muscle spasms      Medication List    STOP taking  these medications   oxyCODONE-acetaminophen 5-325 MG tablet Commonly known as: PERCOCET/ROXICET   sulfamethoxazole-trimethoprim 800-160 MG tablet Commonly known as: BACTRIM DS     TAKE these medications   Alirocumab 150 MG/ML Soaj Commonly known as: Praluent Inject 150 mg into the skin every 14 (fourteen) days.   amLODipine 5 MG tablet Commonly known as: NORVASC Take 5 mg by mouth daily.   amphetamine-dextroamphetamine 10 MG tablet Commonly known as: ADDERALL Take 10 mg by mouth 3 (three) times daily.   clopidogrel 75 MG tablet Commonly known as: PLAVIX TAKE 1 TABLET (75 MG TOTAL) BY MOUTH DAILY.   hydrochlorothiazide 12.5 MG capsule Commonly known as: MICROZIDE TAKE 1 CAPSULE BY MOUTH DAILY   levothyroxine 75 MCG tablet Commonly known as: SYNTHROID TAKE 1 TABLET BY MOUTH ONCE DAILY   LORazepam 1 MG tablet Commonly known as: ATIVAN Take 1 tablet (1 mg total) by mouth every 8 (eight) hours as needed for anxiety. What changed: when to take this   metoprolol succinate 25 MG 24 hr tablet Commonly known as: TOPROL-XL Take 25 mg by mouth daily.   ondansetron 4 MG tablet Commonly known as: Zofran Take 1 tablet (4 mg total) by mouth every 8 (eight) hours as needed for nausea or vomiting.   pantoprazole 40 MG tablet Commonly known as: PROTONIX TAKE 1 TABLET (40 MG TOTAL) BY MOUTH DAILY.   tadalafil 20 MG tablet Commonly known as: CIALIS TAKE 1 TABLET (20 MG TOTAL) BY MOUTH DAILY AS NEEDED FOR ERECTILE DYSFUNCTION.   Trintellix 10 MG Tabs tablet Generic drug: vortioxetine HBr Take 20 mg by mouth daily.   VISINE OP Place 2-3 drops into both eyes 2 (two) times daily as needed (for dry eyes).     ASK your doctor about these medications   methocarbamol 500 MG tablet Commonly known as: Robaxin Take 1 tablet (500 mg total) by mouth every 8 (eight) hours as needed for up to 5 days for muscle spasms. Ask about: Should I take this medication?   oxyCODONE-acetaminophen  10-325 MG tablet Commonly known as: Percocet Take 1 tablet by mouth every 6 (six) hours as needed for up to 5 days for pain. Ask about: Should I take this medication?       Diagnostic Studies: Dg Chest 2 View  Result Date: 06/10/2019 CLINICAL DATA:  Preoperative radiograph. EXAM: CHEST - 2 VIEW COMPARISON:  October 24, 2016 FINDINGS: Postsurgical changes of CABG. Cardiomediastinal silhouette is normal. Mediastinal contours appear intact. There is no evidence of focal airspace consolidation, pleural effusion or pneumothorax. Osseous structures are without acute abnormality. Soft tissues are grossly normal. IMPRESSION: No active cardiopulmonary disease. Electronically Signed   By: Fidela Salisbury M.D.   On: 06/10/2019 16:51   Dg Lumbar Spine 2-3 Views  Addendum Date: 06/17/2019   ADDENDUM REPORT: 06/17/2019 09:40 ADDENDUM: Results called to the OR at 9:40 a.m. Electronically Signed   By: Marin Olp M.D.   On: 06/17/2019 09:40   Result Date: 06/17/2019 CLINICAL DATA:  L2-3 discectomy. EXAM: LUMBAR SPINE - 2-3 VIEW COMPARISON:  None. FINDINGS: Initial image demonstrates surgical instruments with tip over the posterior elements at the level of the L2-3 disc and more inferior surgical instrument has tip over the posterior elements at the level of the inferior endplate of L3. Subsequent image demonstrates a surgical instrument with tip over the posterior elements at the level of the L2-3 disc space. There is mild to moderate spondylosis of the lumbar spine to include facet arthropathy. Mild disc space narrowing at the L2-3 and L3-4 levels. IMPRESSION: Intraoperative localization images as described. Electronically Signed: By: Marin Olp M.D. On: 06/17/2019 09:32    Discharge Instructions    Incentive spirometry RT   Complete by: As directed       Follow-up Information    Melina Schools, MD. Schedule an appointment as soon as possible for a visit in 2 weeks.   Specialty: Orthopedic  Surgery Why: If symptoms worsen, For suture removal, For wound re-check Contact information: [address] [place] [address] [id number]           Discharge Plan:  discharge to home  Disposition: stable    Signed: Yvonne Kendall Ward for [facility] PA-C [facility] [phone] 06/26/2019, 1:39 PM

## 2019-07-01 ENCOUNTER — Encounter: Payer: Self-pay | Admitting: Cardiovascular Disease

## 2019-07-01 ENCOUNTER — Other Ambulatory Visit: Payer: Self-pay

## 2019-07-01 ENCOUNTER — Ambulatory Visit: Payer: 59 | Admitting: Cardiovascular Disease

## 2019-07-01 DIAGNOSIS — E782 Mixed hyperlipidemia: Secondary | ICD-10-CM

## 2019-07-01 DIAGNOSIS — I2510 Atherosclerotic heart disease of native coronary artery without angina pectoris: Secondary | ICD-10-CM

## 2019-07-01 DIAGNOSIS — I10 Essential (primary) hypertension: Secondary | ICD-10-CM | POA: Diagnosis not present

## 2019-07-01 DIAGNOSIS — I6523 Occlusion and stenosis of bilateral carotid arteries: Secondary | ICD-10-CM

## 2019-07-01 NOTE — Assessment & Plan Note (Signed)
History of essential hypertension with blood pressure measured at 148/90.  He is on hydrochlorothiazide and metoprolol.

## 2019-07-01 NOTE — Assessment & Plan Note (Signed)
History of CAD status post CABG x5 by Dr. Roxan Hockey June 16, 2008 with a free RIMA to the LAD, sequential vein to the first diagonal branch, first and second obtuse marginal branches as well as the PDA.  He had a Myoview stress test performed 12/11/2011 that was normal and again 03/29/2016 that showed reversible lateral wall ischemia per business I performed cardiac catheterization 05/14/2016 revealing severe native and graft disease with normal LV function.  Specifically, his RCA vein graft was occluded codominant RCA and circumflex graft which is sequential was occluded.  He had native circumflex disease.  Ended up performing PCI and drug-eluting stenting of his native circumflex 06/14/2016 with an excellent result and resolution of his symptoms.  He has done well since.  Has had some mild shortness of breath recently post lumbar laminectomy by Dr. Rolena Infante.

## 2019-07-01 NOTE — Assessment & Plan Note (Signed)
History of carotid artery disease with Doppler performed 04/28/2019 revealing moderate bilateral ICA stenosis.  This will be repeated in 1 year.

## 2019-07-01 NOTE — Progress Notes (Signed)
07/01/2019 [name]   12-Nov-1950  [id number]  Primary Physician Baxley, Cresenciano Lick, MD Primary Cardiologist: [name] MD FACP, [place], [place], [place]  HPI:  [name] is a 68 y.o.  thin-appearing, married Caucasian male, father of 62, grandfather to 4 grandchildren who I last saw  virtually via video 02/13/2019.Marland Kitchen He has a history of coronary artery disease status post bypass grafting x5 by Dr. Merilynn Finland June 16, 2008, with a free LIMA to his LAD, sequential vein to the first diagonal branch, first and second obtuse marginal branches, as well as a vein to the PDA. His other problems include hypertension and hyperlipidemia. He denies chest pain or shortness of breath. I did perform cerebral angiography on him May 05, 2008, because of a question of a high-grade internal carotid artery stenosis read by the radiologist. However, this turned out to be a left external carotid artery stenosis. He does have high-grade ostial left internal mammary artery stenosis which necessitated the use of a free LIMA. He is statin intolerant. 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 A Myoview stress test performed December 11, 2011, was normal and carotid Dopplers did show a moderately severe right ICA stenosis which we have been following by duplex ultrasound. 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 Since I saw him 12 months ago he has noticed increasing dyspnea on exertion over the last several months, especially noticeable while mowing his lawn. I obtained a Myoview stress test on him based on this 03/29/16 that showed a large reversible lateral wall perfusion defect consistent with ischemia.He underwent outpatient cardiac catheterization by myself on 05/14/16  revealing severe native and graft vessel disease with normal LV function. Specifically, his RCA vein graft was occluded to an occluded dominant right and his circumflex vein graft which was sequential to 2 obtuse marginal branches was occluded at the aorta to patent continuation between OM1 and 2. He did have native circumflex disease. Dr. Roxan Hockey saw him during his brief hospitalization to discuss the possibility of redo coronary artery bypass grafting although the consensus was that he would be better served with attempted PCI and stenting of his native circumflex coronary artery. I perform this electively on 06/14/16 and implanted a 2.25 mm x 38 mm long synergy drug-eluting stent in his AV groove circumflex into his obtuse marginal branch. He had excellent angiographic and clinical result. He was discharged on the following day. His dyspnea ultimately resolved. Since I saw him in the office 61months ago he's remained clinically stable.He does have a right shoulder orthopedic issuebut apparently he is not a surgical candidate.  He does complain of some breathing abnormality which does not does not necessarily sound like shortness of breath. He had an excellent lipid profile performed by his PCP on 12/04/2018 with a total cholesterol 162, LDL 68 and HDL 72 on Praluent.  He has developed increasing PSA and had scheduled to have a prostate biopsy by Dr. Rosana Hoes that did show aggressive left-sided prostate CA.    Since I saw him back virtually 4 months ago he has had L2/L3 laminectomy with Dr. Rolena Infante and he is slowly recuperating from this.  He is wearing a back brace.  He denies chest pain.   Current Meds  Medication  Sig   Alirocumab (PRALUENT) 150 MG/ML SOAJ Inject 150 mg into the skin every 14 (fourteen) days.   amLODipine (NORVASC) 5 MG tablet Take 5 mg by mouth daily.   amphetamine-dextroamphetamine (ADDERALL) 10 MG tablet Take 10 mg by mouth 3 (three) times daily.    clopidogrel (PLAVIX) 75 MG  tablet TAKE 1 TABLET (75 MG TOTAL) BY MOUTH DAILY.   hydrochlorothiazide (MICROZIDE) 12.5 MG capsule TAKE 1 CAPSULE BY MOUTH DAILY (Patient taking differently: Take 12.5 mg by mouth daily. )   levothyroxine (SYNTHROID) 75 MCG tablet TAKE 1 TABLET BY MOUTH ONCE DAILY   LORazepam (ATIVAN) 1 MG tablet Take 1 tablet (1 mg total) by mouth every 8 (eight) hours as needed for anxiety. (Patient taking differently: Take 1 mg by mouth 4 (four) times daily as needed for anxiety. )   metoprolol succinate (TOPROL-XL) 25 MG 24 hr tablet Take 25 mg by mouth daily.   pantoprazole (PROTONIX) 40 MG tablet TAKE 1 TABLET (40 MG TOTAL) BY MOUTH DAILY.   tadalafil (ADCIRCA/CIALIS) 20 MG tablet TAKE 1 TABLET (20 MG TOTAL) BY MOUTH DAILY AS NEEDED FOR ERECTILE DYSFUNCTION.   Tetrahydrozoline HCl (VISINE OP) Place 2-3 drops into both eyes 2 (two) times daily as needed (for dry eyes).   TRINTELLIX 10 MG TABS tablet Take 20 mg by mouth daily.    [DISCONTINUED] ondansetron (ZOFRAN) 4 MG tablet Take 1 tablet (4 mg total) by mouth every 8 (eight) hours as needed for nausea or vomiting.     Allergies  Allergen Reactions   Crestor [Rosuvastatin Calcium] Other (See Comments)    Difficulty swallowing   Tramadol Shortness Of Breath   Lisinopril Cough   Gabapentin Other (See Comments)    Hallucinations   Tricor [Fenofibrate] Other (See Comments)    UNSPECIFIED REACTION   Lipitor [Atorvastatin Calcium] Other (See Comments)    Muscle spasms    Social History   Socioeconomic History   Marital status: Married    Spouse name: Not on file   Number of children: Not on file   Years of education: Not on file   Highest education level: Not on file  Occupational History   Not on file  Social Needs   Financial resource strain: Not on file   Food insecurity    Worry: Not on file    Inability: Not on file   Transportation needs    Medical: Not on file    Non-medical: Not on file  Tobacco Use    Smoking status: Former Smoker    Packs/day: 3.00    Years: 30.00    Pack years: 90.00    Types: Cigarettes    Quit date: 01/29/2001    Years since quitting: 18.4   Smokeless tobacco: Never Used  Substance and Sexual Activity   Alcohol use: Yes    Alcohol/week: 35.0 standard drinks    Types: 35 Cans of beer per week    Comment: 4-5 day beers   Drug use: No   Sexual activity: Not on file  Lifestyle   Physical activity    Days per week: Not on file    Minutes per session: Not on file   Stress: Not on file  Relationships   Social connections    Talks on phone: Not on file    Gets together: Not on file    Attends religious service: Not on file    Active member of club or organization: Not on file    Attends meetings of clubs  or organizations: Not on file    Relationship status: Not on file   Intimate partner violence    Fear of current or ex partner: Not on file    Emotionally abused: Not on file    Physically abused: Not on file    Forced sexual activity: Not on file  Other Topics Concern   Not on file  Social History Narrative   Not on file     Review of Systems: General: negative for chills, fever, night sweats or weight changes.  Cardiovascular: negative for chest pain, dyspnea on exertion, edema, orthopnea, palpitations, paroxysmal nocturnal dyspnea or shortness of breath Dermatological: negative for rash Respiratory: negative for cough or wheezing Urologic: negative for hematuria Abdominal: negative for nausea, vomiting, diarrhea, bright red blood per rectum, melena, or hematemesis Neurologic: negative for visual changes, syncope, or dizziness All other systems reviewed and are otherwise negative except as noted above.    Blood pressure (!) 148/90, pulse 74, temperature (!) 97.3 F (36.3 C), height 5\' 7"  (1.702 m), weight 153 lb (69.4 kg).  General appearance: alert and no distress Neck: no adenopathy, no carotid bruit, no JVD, supple, symmetrical,  trachea midline and thyroid not enlarged, symmetric, no tenderness/mass/nodules Lungs: clear to auscultation bilaterally Heart: regular rate and rhythm, S1, S2 normal, no murmur, click, rub or gallop Extremities: extremities normal, atraumatic, no cyanosis or edema Pulses: 2+ and symmetric Skin: Skin color, texture, turgor normal. No rashes or lesions Neurologic: Alert and oriented X 3, normal strength and tone. Normal symmetric reflexes. Normal coordination and gait  EKG not performed today  ASSESSMENT AND PLAN:   Coronary artery disease History of CAD status post CABG x5 by Dr. Roxan Hockey June 16, 2008 with a free RIMA to the LAD, sequential vein to the first diagonal branch, first and second obtuse marginal branches as well as the PDA.  He had a Myoview stress test performed 12/11/2011 that was normal and again 03/29/2016 that showed reversible lateral wall ischemia per business I performed cardiac catheterization 05/14/2016 revealing severe native and graft disease with normal LV function.  Specifically, his RCA vein graft was occluded codominant RCA and circumflex graft which is sequential was occluded.  He had native circumflex disease.  Ended up performing PCI and drug-eluting stenting of his native circumflex 06/14/2016 with an excellent result and resolution of his symptoms.  He has done well since.  Has had some mild shortness of breath recently post lumbar laminectomy by Dr. Rolena Infante.  Hypertension History of essential hypertension with blood pressure measured at 148/90.  He is on hydrochlorothiazide and metoprolol.  Internal carotid artery stenosis History of carotid artery disease with Doppler performed 04/28/2019 revealing moderate bilateral ICA stenosis.  This will be repeated in 1 year.  Hyperlipidemia History of hyperlipidemia on Praluent with lipid profile performed 12/04/2018 revealing total cholesterol 162, LDL 68 and HDL 72.      [name] MD [facility],  [facility] 07/01/2019 11:02 AM

## 2019-07-01 NOTE — Patient Instructions (Signed)
Medication Instructions:  Your physician recommends that you continue on your current medications as directed. Please refer to the Current Medication list given to you today.  If you need a refill on your cardiac medications before your next appointment, please call your pharmacy.   Lab work: none If you have labs (blood work) drawn today and your tests are completely normal, you will receive your results only by: Marland Kitchen MyChart Message (if you have MyChart) OR . A paper copy in the mail If you have any lab test that is abnormal or we need to change your treatment, we will call you to review the results.  Testing/Procedures: Your physician has requested that you have a carotid duplex. This test is an ultrasound of the carotid arteries in your neck. It looks at blood flow through these arteries that supply the brain with blood. Allow one hour for this exam. There are no restrictions or special instructions. DUE July 2021   Follow-Up: At [facility], you and your health needs are our priority.  As part of our continuing mission to provide you with exceptional heart care, we have created designated Provider Care Teams.  These Care Teams include your primary Cardiologist (physician) and Advanced Practice Providers (APPs -  Physician Assistants and Nurse Practitioners) who all work together to provide you with the care you need, when you need it. You will need a follow up appointment in 6 months with Dr. Quay Burow.  Please call our office 2 months in advance to schedule this/each appointment.

## 2019-07-01 NOTE — Assessment & Plan Note (Signed)
History of hyperlipidemia on Praluent with lipid profile performed 12/04/2018 revealing total cholesterol 162, LDL 68 and HDL 72.

## 2019-07-14 ENCOUNTER — Other Ambulatory Visit: Payer: Self-pay | Admitting: Cardiovascular Disease

## 2019-07-14 MED FILL — PRALUENT 150 MG/ML SOAJ: 150 | 28 days supply | Qty: 2 | Fill #0

## 2019-07-24 MED FILL — TRINTELLIX 5 MG TABLET: 5 | 30 days supply | Qty: 90 | Fill #5

## 2019-07-24 MED FILL — HYDROCHLOROTHIAZIDE 12.5 MG: 12.5 | 90 days supply | Qty: 90 | Fill #1

## 2019-07-24 MED FILL — PANTOPRAZOLE SOD DR 40 MG T: 40 | 90 days supply | Qty: 90 | Fill #1

## 2019-07-24 MED FILL — METOPROLOL SUCCINATE ER 50: 50 | 90 days supply | Qty: 90 | Fill #1

## 2019-08-11 NOTE — Telephone Encounter (Signed)
LVM for [place] to call me about this referral

## 2019-08-14 ENCOUNTER — Other Ambulatory Visit: Payer: Self-pay | Admitting: Cardiovascular Disease

## 2019-08-14 MED FILL — PRALUENT 150 MG/ML SOAJ: 150 | 28 days supply | Qty: 2 | Fill #1

## 2019-08-17 MED FILL — CLOPIDOGREL 75 MG TABLET: 75 | 90 days supply | Qty: 90 | Fill #0

## 2019-08-19 MED FILL — TRINTELLIX 5 MG TABLET: 5 | 30 days supply | Qty: 90 | Fill #0

## 2019-08-19 MED FILL — LORAZEPAM 1 MG TABS: 1 | 90 days supply | Qty: 360 | Fill #0

## 2019-09-01 ENCOUNTER — Ambulatory Visit (INDEPENDENT_AMBULATORY_CARE_PROVIDER_SITE_OTHER): Payer: 59 | Admitting: Cardiovascular Disease

## 2019-09-01 ENCOUNTER — Encounter: Payer: Self-pay | Admitting: Cardiovascular Disease

## 2019-09-01 ENCOUNTER — Other Ambulatory Visit: Payer: Self-pay

## 2019-09-01 VITALS — BP 144/76 | HR 62 | Temp 96.4°F | Ht 67.0 in | Wt 164.0 lb

## 2019-09-01 DIAGNOSIS — I2510 Atherosclerotic heart disease of native coronary artery without angina pectoris: Secondary | ICD-10-CM | POA: Diagnosis not present

## 2019-09-01 DIAGNOSIS — E782 Mixed hyperlipidemia: Secondary | ICD-10-CM

## 2019-09-01 DIAGNOSIS — I10 Essential (primary) hypertension: Secondary | ICD-10-CM

## 2019-09-01 DIAGNOSIS — I6523 Occlusion and stenosis of bilateral carotid arteries: Secondary | ICD-10-CM

## 2019-09-01 NOTE — Progress Notes (Signed)
09/01/2019 [name]   01-04-1951  [id number]  Primary Physician Baxley, Cresenciano Lick, MD Primary Cardiologist: [name] MD [name], [place]  HPI:  [name] is a 68 y.o.  thin-appearing, married Caucasian male, father of 23, grandfather to 4 grandchildren who I last saw9/30/2020.Marland Kitchen He has a history of coronary artery disease status post bypass grafting x5 by Dr. Merilynn Finland June 16, 2008, with a free LIMA to his LAD, sequential vein to the first diagonal branch, first and second obtuse marginal branches, as well as a vein to the PDA. His other problems include hypertension and hyperlipidemia. He denies chest pain or shortness of breath. I did perform cerebral angiography on him May 05, 2008, because of a question of a high-grade internal carotid artery stenosis read by the radiologist. However, this turned out to be a left external carotid artery stenosis. He does have high-grade ostial left internal mammary artery stenosis which necessitated the use of a free LIMA. He is statin intolerant. 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 A Myoview stress test performed December 11, 2011, was normal and carotid Dopplers did show a moderately severe right ICA stenosis which we have been following by duplex ultrasound. 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 Since I saw him 12 months ago he has noticed increasing dyspnea on exertion over the last several months, especially noticeable while mowing his lawn. I obtained a Myoview stress test on him based on this 03/29/16 that showed a large reversible lateral wall perfusion defect consistent with ischemia.He underwent outpatient cardiac catheterization by myself on 05/14/16 revealing severe native and  graft vessel disease with normal LV function. Specifically, his RCA vein graft was occluded to an occluded dominant right and his circumflex vein graft which was sequential to 2 obtuse marginal branches was occluded at the aorta to patent continuation between OM1 and 2. He did have native circumflex disease. Dr. Roxan Hockey saw him during his brief hospitalization to discuss the possibility of redo coronary artery bypass grafting although the consensus was that he would be better served with attempted PCI and stenting of his native circumflex coronary artery. I perform this electively on 06/14/16 and implanted a 2.25 mm x 38 mm long synergy drug-eluting stent in his AV groove circumflex into his obtuse marginal branch. He had excellent angiographic and clinical result. He was discharged on the following day. His dyspnea ultimately resolved. Since I saw him in the office 14months ago he's remained clinically stable.He does have a right shoulder orthopedic issuebut apparently he is not a surgical candidate.  He does complain of some breathing abnormality which does not does not necessarily sound like shortness of breath. He had an excellent lipid profile performed by his PCP on3/01/2019 with a total cholesterol 162, LDL 68 and HDL 72 on Praluent. He has developed increasing PSA and had scheduled to have a prostate biopsy by Dr. Rosana Hoes that did show aggressive left-sided prostate CA.    Since I saw him 3 months ago he continues to do well.  He denies chest pain or shortness of breath.  He apparently does need a prostate biopsy however.   Current Meds  Medication Sig   amLODipine (NORVASC) 5 MG tablet Take 5 mg by mouth  daily.   amphetamine-dextroamphetamine (ADDERALL) 10 MG tablet Take 10 mg by mouth 3 (three) times daily.    clopidogrel (PLAVIX) 75 MG tablet TAKE 1 TABLET (75 MG TOTAL) BY MOUTH DAILY.   hydrochlorothiazide (MICROZIDE) 12.5 MG capsule TAKE 1 CAPSULE BY MOUTH DAILY (Patient taking  differently: Take 12.5 mg by mouth daily. )   levothyroxine (SYNTHROID) 75 MCG tablet TAKE 1 TABLET BY MOUTH ONCE DAILY   LORazepam (ATIVAN) 1 MG tablet Take 1 tablet (1 mg total) by mouth every 8 (eight) hours as needed for anxiety. (Patient taking differently: Take 1 mg by mouth 4 (four) times daily as needed for anxiety. )   metoprolol succinate (TOPROL-XL) 25 MG 24 hr tablet Take 25 mg by mouth daily.   pantoprazole (PROTONIX) 40 MG tablet TAKE 1 TABLET (40 MG TOTAL) BY MOUTH DAILY.   PRALUENT 150 MG/ML SOAJ INJECT 150 MG INTO THE SKIN EVERY 14 (FOURTEEN) DAYS.   tadalafil (ADCIRCA/CIALIS) 20 MG tablet TAKE 1 TABLET (20 MG TOTAL) BY MOUTH DAILY AS NEEDED FOR ERECTILE DYSFUNCTION.   Tetrahydrozoline HCl (VISINE OP) Place 2-3 drops into both eyes 2 (two) times daily as needed (for dry eyes).   TRINTELLIX 10 MG TABS tablet Take 20 mg by mouth daily.      Allergies  Allergen Reactions   Crestor [Rosuvastatin Calcium] Other (See Comments)    Difficulty swallowing   Tramadol Shortness Of Breath   Lisinopril Cough   Gabapentin Other (See Comments)    Hallucinations   Tricor [Fenofibrate] Other (See Comments)    UNSPECIFIED REACTION   Lipitor [Atorvastatin Calcium] Other (See Comments)    Muscle spasms    Social History   Socioeconomic History   Marital status: Married    Spouse name: Not on file   Number of children: Not on file   Years of education: Not on file   Highest education level: Not on file  Occupational History   Not on file  Social Needs   Financial resource strain: Not on file   Food insecurity    Worry: Not on file    Inability: Not on file   Transportation needs    Medical: Not on file    Non-medical: Not on file  Tobacco Use   Smoking status: Former Smoker    Packs/day: 3.00    Years: 30.00    Pack years: 90.00    Types: Cigarettes    Quit date: 01/29/2001    Years since quitting: 18.6   Smokeless tobacco: Never Used  Substance  and Sexual Activity   Alcohol use: Yes    Alcohol/week: 35.0 standard drinks    Types: 35 Cans of beer per week    Comment: 4-5 day beers   Drug use: No   Sexual activity: Not on file  Lifestyle   Physical activity    Days per week: Not on file    Minutes per session: Not on file   Stress: Not on file  Relationships   Social connections    Talks on phone: Not on file    Gets together: Not on file    Attends religious service: Not on file    Active member of club or organization: Not on file    Attends meetings of clubs or organizations: Not on file    Relationship status: Not on file   Intimate partner violence    Fear of current or ex partner: Not on file    Emotionally abused: Not on file  Physically abused: Not on file    Forced sexual activity: Not on file  Other Topics Concern   Not on file  Social History Narrative   Not on file     Review of Systems: General: negative for chills, fever, night sweats or weight changes.  Cardiovascular: negative for chest pain, dyspnea on exertion, edema, orthopnea, palpitations, paroxysmal nocturnal dyspnea or shortness of breath Dermatological: negative for rash Respiratory: negative for cough or wheezing Urologic: negative for hematuria Abdominal: negative for nausea, vomiting, diarrhea, bright red blood per rectum, melena, or hematemesis Neurologic: negative for visual changes, syncope, or dizziness All other systems reviewed and are otherwise negative except as noted above.    Blood pressure (!) 144/76, pulse 62, temperature (!) 96.4 F (35.8 C), height 5\' 7"  (1.702 m), weight 164 lb (74.4 kg).  General appearance: alert and no distress Neck: no adenopathy, no JVD, supple, symmetrical, trachea midline, thyroid not enlarged, symmetric, no tenderness/mass/nodules and Bilateral carotid bruits Lungs: clear to auscultation bilaterally Heart: regular rate and rhythm, S1, S2 normal, no murmur, click, rub or  gallop Extremities: extremities normal, atraumatic, no cyanosis or edema Pulses: 2+ and symmetric Skin: Skin color, texture, turgor normal. No rashes or lesions Neurologic: Alert and oriented X 3, normal strength and tone. Normal symmetric reflexes. Normal coordination and gait  EKG not performed today  ASSESSMENT AND PLAN:   Coronary artery disease History of CAD status post coronary artery bypass grafting x5 by Dr. Roxan Hockey June 16, 2008 with a free LIMA to the LAD, sequential vein to the first diagonal branch, first and second obtuse marginal branches as well as a vein to the PDA.  Because of exertional chest pain while mowing his lawn I obtained a Myoview stress test 03/29/2016 that showed a large reversible lateral wall perfusion abnormality.  He underwent cardiac catheterization by myself 05/14/2016 revealing severe native and graft vessel disease with normal LV function.  Specifically, his RCA vein graft was occluded to an occluded dominant RCA and circumflex vein graft which was sequential to 2 marginal branches was occluded at the aorta and patent to the continuation between OM1 and OM 2.  He did have native circumflex disease.  Dr. Roxan Hockey saw him in consultation about redo bypass surgery and the consensus was that he would be better off served with native coronary intervention.  I performed elective circumflex stenting 06/14/2016 using a 2.25 mm x 38 mm long Synergy drug-eluting stent in the AV groove circumflex into the obtuse marginal branch with an excellent result.  His symptoms resolved after that.  He has had no recurrent chest pain or shortness of breath.  Hypertension History of essential hypertension with blood pressure measured today 144/76.  He is on amlodipine, hydrochlorothiazide and metoprolol.  Internal carotid artery stenosis History of moderate bilateral ICA stenosis by duplex ultrasound recently performed 04/20/2019.  We will repeat this on annual  basis.  Hyperlipidemia History of hyperlipidemia on Praluent with lipid profile performed 12/04/2018 revealing total cholesterol 162, LDL of 68 and HDL of 72.      [name] MD FACP,FACC,FAHA, [facility] 09/01/2019 2:37 PM

## 2019-09-01 NOTE — Assessment & Plan Note (Signed)
History of moderate bilateral ICA stenosis by duplex ultrasound recently performed 04/20/2019.  We will repeat this on annual basis.

## 2019-09-01 NOTE — Assessment & Plan Note (Signed)
History of hyperlipidemia on Praluent with lipid profile performed 12/04/2018 revealing total cholesterol 162, LDL of 68 and HDL of 72.

## 2019-09-01 NOTE — Assessment & Plan Note (Signed)
History of essential hypertension with blood pressure measured today 144/76.  He is on amlodipine, hydrochlorothiazide and metoprolol.

## 2019-09-01 NOTE — Patient Instructions (Signed)
Medication Instructions:  Your physician recommends that you continue on your current medications as directed. Please refer to the Current Medication list given to you today.  If you need a refill on your cardiac medications before your next appointment, please call your pharmacy.   Lab work: NONE  Testing/Procedures: Your physician has requested that you have a carotid duplex in July 2021. This test is an ultrasound of the carotid arteries in your neck. It looks at blood flow through these arteries that supply the brain with blood. Allow one hour for this exam. There are no restrictions or special instructions.    Follow-Up: At [facility], you and your health needs are our priority.  As part of our continuing mission to provide you with exceptional heart care, we have created designated Provider Care Teams.  These Care Teams include your primary Cardiologist (physician) and Advanced Practice Providers (APPs -  Physician Assistants and Nurse Practitioners) who all work together to provide you with the care you need, when you need it. You may see Quay Burow, MD or one of the following Advanced Practice Providers on your designated Care Team:    Kerin Ransom, PA-C  [place], [place]  Coletta Memos, [place]  Your physician wants you to follow-up in: 1 year.

## 2019-09-01 NOTE — Assessment & Plan Note (Signed)
History of CAD status post coronary artery bypass grafting x5 by Dr. Roxan Hockey June 16, 2008 with a free LIMA to the LAD, sequential vein to the first diagonal branch, first and second obtuse marginal branches as well as a vein to the PDA.  Because of exertional chest pain while mowing his lawn I obtained a Myoview stress test 03/29/2016 that showed a large reversible lateral wall perfusion abnormality.  He underwent cardiac catheterization by myself 05/14/2016 revealing severe native and graft vessel disease with normal LV function.  Specifically, his RCA vein graft was occluded to an occluded dominant RCA and circumflex vein graft which was sequential to 2 marginal branches was occluded at the aorta and patent to the continuation between OM1 and OM 2.  He did have native circumflex disease.  Dr. Roxan Hockey saw him in consultation about redo bypass surgery and the consensus was that he would be better off served with native coronary intervention.  I performed elective circumflex stenting 06/14/2016 using a 2.25 mm x 38 mm long Synergy drug-eluting stent in the AV groove circumflex into the obtuse marginal branch with an excellent result.  His symptoms resolved after that.  He has had no recurrent chest pain or shortness of breath.

## 2019-09-03 ENCOUNTER — Other Ambulatory Visit: Payer: Self-pay | Admitting: Internal Medicine

## 2019-09-03 MED FILL — LEVOTHYROXINE 75 MCG TABLET: 75 | 90 days supply | Qty: 90 | Fill #0

## 2019-09-09 DIAGNOSIS — Z20828 Contact with and (suspected) exposure to other viral communicable diseases: Secondary | ICD-10-CM | POA: Diagnosis not present

## 2019-09-09 DIAGNOSIS — Z01812 Encounter for preprocedural laboratory examination: Secondary | ICD-10-CM | POA: Diagnosis not present

## 2019-09-09 DIAGNOSIS — R972 Elevated prostate specific antigen [PSA]: Secondary | ICD-10-CM | POA: Diagnosis not present

## 2019-09-11 ENCOUNTER — Other Ambulatory Visit: Payer: Self-pay | Admitting: Internal Medicine

## 2019-09-11 MED FILL — PRALUENT 150 MG/ML SOAJ: 150 | 28 days supply | Qty: 2 | Fill #2

## 2019-09-11 MED FILL — TADALAFIL 20 MG TABS: 20 | 30 days supply | Qty: 6 | Fill #0

## 2019-09-14 MED FILL — DEXTROAMPH TB 30MG NSTR 100: 30 | 90 days supply | Qty: 270 | Fill #0

## 2019-09-16 DIAGNOSIS — R972 Elevated prostate specific antigen [PSA]: Secondary | ICD-10-CM | POA: Diagnosis not present

## 2019-09-16 DIAGNOSIS — E785 Hyperlipidemia, unspecified: Secondary | ICD-10-CM | POA: Diagnosis not present

## 2019-09-16 DIAGNOSIS — I2510 Atherosclerotic heart disease of native coronary artery without angina pectoris: Secondary | ICD-10-CM | POA: Diagnosis not present

## 2019-09-16 DIAGNOSIS — N4289 Other specified disorders of prostate: Secondary | ICD-10-CM | POA: Diagnosis not present

## 2019-09-16 DIAGNOSIS — E039 Hypothyroidism, unspecified: Secondary | ICD-10-CM | POA: Diagnosis not present

## 2019-09-16 DIAGNOSIS — F909 Attention-deficit hyperactivity disorder, unspecified type: Secondary | ICD-10-CM | POA: Diagnosis not present

## 2019-09-16 DIAGNOSIS — F419 Anxiety disorder, unspecified: Secondary | ICD-10-CM | POA: Diagnosis not present

## 2019-09-16 DIAGNOSIS — K219 Gastro-esophageal reflux disease without esophagitis: Secondary | ICD-10-CM | POA: Diagnosis not present

## 2019-09-16 DIAGNOSIS — N411 Chronic prostatitis: Secondary | ICD-10-CM | POA: Diagnosis not present

## 2019-09-16 DIAGNOSIS — F329 Major depressive disorder, single episode, unspecified: Secondary | ICD-10-CM | POA: Diagnosis not present

## 2019-09-16 DIAGNOSIS — I10 Essential (primary) hypertension: Secondary | ICD-10-CM | POA: Diagnosis not present

## 2019-09-16 MED FILL — HYDROCODON-APAP 5-325: 5-325 | 3 days supply | Qty: 10 | Fill #0

## 2019-09-16 MED FILL — TAMSULOSIN HCL 0.4 MG CAP: 0.4 | 20 days supply | Qty: 20 | Fill #0

## 2019-09-16 MED FILL — SULFAMETHOXAZOLE-TMP SS TAB: 400-80 | 5 days supply | Qty: 10 | Fill #0

## 2019-09-21 MED FILL — CLINDAMYCIN HCL 150 MG CAPS: 150 | 1 days supply | Qty: 4 | Fill #0

## 2019-09-22 MED FILL — AMOXICILLIN 500 MG CAPSULE: 500 | 5 days supply | Qty: 15 | Fill #0

## 2019-09-22 MED FILL — HYDROCODON-APAP 5-325: 5-325 | 3 days supply | Qty: 12 | Fill #0

## 2019-09-23 ENCOUNTER — Other Ambulatory Visit: Payer: Self-pay

## 2019-09-23 ENCOUNTER — Encounter: Payer: Self-pay | Admitting: Pulmonary Disease

## 2019-09-23 ENCOUNTER — Ambulatory Visit: Payer: 59 | Admitting: Pulmonary Disease

## 2019-09-23 VITALS — BP 148/68 | HR 65 | Temp 98.3°F | Ht 67.0 in | Wt 165.2 lb

## 2019-09-23 DIAGNOSIS — R0602 Shortness of breath: Secondary | ICD-10-CM

## 2019-09-23 MED ORDER — ALBUTEROL SULFATE HFA 108 (90 BASE) MCG/ACT IN AERS: 2.0000 | INHALATION_SPRAY | Freq: Four times a day (QID) | RESPIRATORY_TRACT | 5 refills | Status: AC | PRN

## 2019-09-23 MED FILL — ALBUTEROL SULFATE HFA 108 (: 108 (90 BAS | 25 days supply | Qty: 18 | Fill #0

## 2019-09-23 NOTE — Patient Instructions (Signed)
Shortness of breath Difficulty with exhalation  Albuterol to be used up to four times a day as needed for symptoms of chest tightness  Regular exercises  We will get a breathing study on you to assess if there is significant obstructive lung disease  A more long-term inhaler once we have the results  Call with any significant concerns  We will see you in 6 weeks

## 2019-09-23 NOTE — Progress Notes (Signed)
Subjective:    Patient ID: [name], male    DOB: 1951/02/24, 68 y.o.   MRN: [id number]  Patient being seen for shortness of breath/difficulty exhaling  Noticed symptoms about a year ago Never really cleared completely  At some point he had a cough with thick phlegm-this is better at present  No chest pains or chest discomfort  Reformed smoker, quit in 1995, smoked up to 2 packs a day  Has had some health issues recently including a prostate biopsy which was negative L3-4 compression for which she had intervention  Recent echocardiogram did show some diastolic dysfunction  No history of lung disease/asthma when he was younger  Occupational history significant for multiple occupations, worked in a, worked for a long time, 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 Anxiety   . Arthritis    "NECK, HANDS, KNEES" (06/14/2016)  . CAD (coronary artery disease)   . Carotid artery stenosis   . Complication of anesthesia    Reports that he had trouble swallowing post anesthesia- CABG  . COPD (chronic obstructive pulmonary disease) ([place]) 12/11/2011   r/s mv - EF 72%; exercise capacity 13 METS; no exercised induced ischemic EKG changes  . Depression, major   . Dyspnea    with exertion  . GERD (gastroesophageal reflux disease)   . HTN (hypertension), benign 04/16/2008   echo - EF >55%; no regional wall or valvular abnormalities  . Hyperlipidemia    statin intolerant  . Hypothyroidism   . Migraine    "crippling headaches as a kid; silent migraines now, no pain, couple times/week" (06/14/2016)  . Prostate cancer ([place]) 04/2019   recent dx - Dr Lawerance Bach  . S/P angioplasty with stent 06/14/16 PCI & DES of high grade AV groove LCX and 1st OM  06/15/2016  . Squamous cell cancer of tongue ([place]) ~  2002   "35 [facility] at [facility]  . Subclavian steal syndrome 06/17/2012   carotid doppler - R systolic brachial pressure 144mmHg, L 110mmHg; R subclavian artery - proxmial obsstruction w/ abnormal monophasic waveforms, R ECA known occlusive disease; L ECA narrowing w/ 70-99% diameter reductiona  . TIA (transient ischemic attack) 2005  . Viral hepatitis 1970s   "non specific"    Social History   Socioeconomic History  . Marital status: Married    Spouse name: Not on file  . Number of children: Not on file  . Years of education: Not on file  . Highest education level: Not on file  Occupational History  . Not on file  Tobacco Use  . Smoking status: Former Smoker    Packs/day: 3.00    Years: 30.00    Pack years: 90.00    Types: Cigarettes    Quit date: 01/29/2001    Years since quitting: 18.6  . Smokeless tobacco: Never Used  Substance and Sexual Activity  . Alcohol use: Yes    Alcohol/week: 35.0 standard drinks    Types: 35 Cans of beer per week    Comment: 4-5 day beers  . Drug use: No  . Sexual activity: Not on file  Other Topics Concern  . Not on file  Social History Narrative  . Not on file   Social Determinants of Health   Financial Resource Strain:   .  Difficulty of Paying Living Expenses: Not on file  Food Insecurity:   . Worried About Charity fundraiser in the Last Year: Not on file  . Ran Out of Food in the Last Year: Not on file  Transportation Needs:   . Lack of Transportation (Medical): Not on file  . Lack of Transportation (Non-Medical): Not on file  Physical Activity:   . Days of Exercise per Week: Not on file  . Minutes of Exercise per Session: Not on file  Stress:   . Feeling of Stress : Not on file  Social Connections:   . Frequency of Communication with Friends and Family: Not on file  . Frequency of Social Gatherings with Friends and Family: Not on file  . Attends Religious Services: Not on file  . Active Member of Clubs or  Organizations: Not on file  . Attends Archivist Meetings: Not on file  . Marital Status: Not on file  Intimate Partner Violence:   . Fear of Current or Ex-Partner: Not on file  . Emotionally Abused: Not on file  . Physically Abused: Not on file  . Sexually Abused: Not on file   Family History  Problem Relation Age of Onset  . Cancer Mother        ovarian  . Stroke Father   . Diabetes Father   . Arthritis Father   . Hypertension Brother    Review of Systems  Constitutional: Negative for fever and unexpected weight change.  HENT: Positive for congestion, dental problem, sneezing and trouble swallowing. Negative for ear pain, nosebleeds, postnasal drip, rhinorrhea, sinus pressure and sore throat.   Eyes: Negative for redness and itching.  Respiratory: Positive for shortness of breath. Negative for cough, chest tightness and wheezing.   Cardiovascular: Negative for palpitations and leg swelling.  Gastrointestinal: Negative for nausea and vomiting.  Genitourinary: Negative for dysuria.  Musculoskeletal: Negative for joint swelling.  Skin: Negative for rash.  Allergic/Immunologic: Negative.  Negative for environmental allergies, food allergies and immunocompromised state.  Neurological: Negative for headaches.  Hematological: Does not bruise/bleed easily.  Psychiatric/Behavioral: Positive for dysphoric mood. The patient is nervous/anxious.       Objective:   Physical Exam Constitutional:      Appearance: Normal appearance.  HENT:     Head: Normocephalic and atraumatic.     Nose: Nose normal.     Mouth/Throat:     Mouth: Mucous membranes are moist.  Eyes:     Pupils: Pupils are equal, round, and reactive to light.  Cardiovascular:     Rate and Rhythm: Normal rate and regular rhythm.     Pulses: Normal pulses.     Heart sounds: Normal heart sounds. No murmur. No friction rub.  Pulmonary:     Effort: Pulmonary effort is normal. No respiratory distress.     Breath  sounds: Normal breath sounds. No stridor. No wheezing or rhonchi.  Musculoskeletal:        General: Normal range of motion.     Cervical back: Normal range of motion and neck supple.  Skin:    General: Skin is warm.  Neurological:     General: No focal deficit present.     Mental Status: He is alert and oriented to person, place, and time.  Psychiatric:        Mood and Affect: Mood normal.    Vitals:   09/23/19 1337  BP: (!) 148/68  Pulse: 65  Temp: 98.3 F (36.8 C)  SpO2: 100%     .  Chest x-ray with hyperinflated lung fields  .  Recent echocardiogram with diastolic dysfunction Assessment & Plan:  .  Shortness of breath .  Difficulty exhaling .  Probable obstructive lung disease .  Significant smoking history-quit in 1995  Plan: Marland Kitchen  Albuterol use as needed  .  Obtain a pulmonary function test .  Graded exercises as tolerated  Based off of the pulmonary function study may require long-acting inhalers  I think he may be air trapping and this may be contributing to his shortness of breath and difficulty exhaling  .  I will see him in follow-up in 6 weeks

## 2019-10-07 MED FILL — FLUoxetine HCL 10 MG CAPS: 10 | 90 days supply | Qty: 270 | Fill #0

## 2019-10-08 MED FILL — TRINTELLIX 5 MG TABLET: 5 | 30 days supply | Qty: 90 | Fill #1

## 2019-10-08 MED FILL — PRALUENT 150 MG/ML SOAJ: 150 | 28 days supply | Qty: 2 | Fill #3

## 2019-10-08 MED FILL — TADALAFIL 20 MG TABS: 20 | 30 days supply | Qty: 6 | Fill #1

## 2019-10-21 ENCOUNTER — Ambulatory Visit: Payer: 59 | Attending: Internal Medicine

## 2019-10-21 DIAGNOSIS — Z23 Encounter for immunization: Secondary | ICD-10-CM | POA: Diagnosis not present

## 2019-10-21 NOTE — Progress Notes (Signed)
   Covid-19 Vaccination Clinic  Name:  [name]    MRN: [id number] DOB: November 06, 1950  10/21/2019  [name] was observed post Covid-19 immunization for 15 minutes without incidence. He was provided with Vaccine Information Sheet and instruction to access the V-Safe system.   [name] was instructed to call 911 with any severe reactions post vaccine: Marland Kitchen Difficulty breathing  . Swelling of your face and throat  . A fast heartbeat  . A bad rash all over your body  . Dizziness and weakness    Immunizations Administered    Name Date Dose VIS Date Route   Pfizer COVID-19 Vaccine 10/21/2019  3:07 PM 0.3 mL 09/11/2019 Intramuscular   Manufacturer: [place]   Lot: [id number]   [place]: [id number]

## 2019-10-22 MED FILL — AMLODIPINE BESYLATE 10 MG T: 10 | 90 days supply | Qty: 90 | Fill #1

## 2019-11-03 MED FILL — PANTOPRAZOLE SOD DR 40 MG T: 40 | 90 days supply | Qty: 90 | Fill #2

## 2019-11-03 MED FILL — PRALUENT 150 MG/ML SOAJ: 150 | 28 days supply | Qty: 2 | Fill #4

## 2019-11-03 MED FILL — TADALAFIL 20 MG TABS: 20 | 30 days supply | Qty: 6 | Fill #2

## 2019-11-04 ENCOUNTER — Ambulatory Visit: Payer: 59

## 2019-11-12 ENCOUNTER — Ambulatory Visit: Payer: 59 | Attending: Internal Medicine

## 2019-11-12 DIAGNOSIS — Z23 Encounter for immunization: Secondary | ICD-10-CM | POA: Insufficient documentation

## 2019-11-12 MED FILL — TRINTELLIX 5 MG TABLET: 5 | 30 days supply | Qty: 90 | Fill #2

## 2019-11-12 NOTE — Progress Notes (Signed)
   Covid-19 Vaccination Clinic  Name:  [name]    MRN: [id number] DOB: Jan 31, 1951  11/12/2019  [name] was observed post Covid-19 immunization for 15 minutes without incidence. He was provided with Vaccine Information Sheet and instruction to access the V-Safe system.   [name] was instructed to call 911 with any severe reactions post vaccine: Marland Kitchen Difficulty breathing  . Swelling of your face and throat  . A fast heartbeat  . A bad rash all over your body  . Dizziness and weakness    Immunizations Administered    Name Date Dose VIS Date Route   Pfizer COVID-19 Vaccine 11/12/2019  8:09 AM 0.3 mL 09/11/2019 Intramuscular   Manufacturer: [place]   Lot: [id number]   [place]: [id number]

## 2019-11-17 MED FILL — HYDROCHLOROTHIAZIDE 12.5 MG: 12.5 | 90 days supply | Qty: 90 | Fill #2

## 2019-12-04 ENCOUNTER — Other Ambulatory Visit: Payer: Self-pay | Admitting: Internal Medicine

## 2019-12-04 MED FILL — TADALAFIL 20 MG TABS: 20 | 30 days supply | Qty: 6 | Fill #3

## 2019-12-04 MED FILL — PRALUENT 150 MG/ML SOAJ: 150 | 28 days supply | Qty: 2 | Fill #5

## 2019-12-04 MED FILL — CLOPIDOGREL 75 MG TABLET: 75 | 90 days supply | Qty: 90 | Fill #1

## 2019-12-04 MED FILL — LEVOTHYROXINE SODIUM 75 MCG: 75 | 90 days supply | Qty: 90 | Fill #0

## 2019-12-18 MED FILL — DEXTROAMPH TB 30MG NSTR 100: 30 | 90 days supply | Qty: 270 | Fill #0

## 2019-12-31 ENCOUNTER — Telehealth: Payer: Self-pay | Admitting: Internal Medicine

## 2019-12-31 NOTE — Telephone Encounter (Signed)
[name] [phone]  [name] called to say that his ear felt like it had fluid in it and now it feels heavy. I let him know office was closing in 15 minutes, so we could not do any thing until Tuesday that he would need to go to Urgent Care or call us back on Tuesday if it was not any better. He verbalized understanding.

## 2020-01-01 MED FILL — PRALUENT 150 MG/ML SOAJ: 150 | 28 days supply | Qty: 2 | Fill #6

## 2020-01-19 MED FILL — LORAZEPAM 1 MG TABS: 1 | 90 days supply | Qty: 360 | Fill #1

## 2020-01-28 MED FILL — TRINTELLIX 5 MG TABLET: 5 | 30 days supply | Qty: 90 | Fill #3

## 2020-01-28 MED FILL — PANTOPRAZOLE SOD DR 40 MG T: 40 | 90 days supply | Qty: 90 | Fill #3

## 2020-02-02 ENCOUNTER — Other Ambulatory Visit: Payer: Self-pay | Admitting: Cardiovascular Disease

## 2020-02-02 MED FILL — PRALUENT 150 MG/ML SOAJ: 150 | 28 days supply | Qty: 2 | Fill #0

## 2020-02-08 ENCOUNTER — Other Ambulatory Visit (HOSPITAL_BASED_OUTPATIENT_CLINIC_OR_DEPARTMENT_OTHER): Payer: Self-pay | Admitting: Specialist

## 2020-02-16 MED FILL — HYDROCHLOROTHIAZIDE 12.5 MG: 12.5 | 90 days supply | Qty: 90 | Fill #3

## 2020-02-25 MED FILL — PRALUENT 150 MG/ML SOAJ: 150 | 28 days supply | Qty: 2 | Fill #1

## 2020-02-25 MED FILL — ALBUTEROL SULFATE HFA 108 (: 108 (90 BAS | 25 days supply | Qty: 18 | Fill #1

## 2020-03-02 ENCOUNTER — Other Ambulatory Visit: Payer: Self-pay | Admitting: Cardiovascular Disease

## 2020-03-02 ENCOUNTER — Other Ambulatory Visit: Payer: Self-pay | Admitting: Internal Medicine

## 2020-03-02 MED FILL — TADALAFIL 20 MG TABS: 20 | 30 days supply | Qty: 6 | Fill #4

## 2020-03-02 MED FILL — CLOPIDOGREL 75 MG TABLET: 75 | 90 days supply | Qty: 90 | Fill #0

## 2020-03-03 MED FILL — LEVOTHYROXINE SODIUM 75 MCG: 75 | 90 days supply | Qty: 90 | Fill #0

## 2020-03-03 NOTE — Telephone Encounter (Signed)
Past due for CPE and labs. This was due in March. Please call him and book PE before refilling.

## 2020-03-03 NOTE — Telephone Encounter (Signed)
Patient will call me back to book CPE.

## 2020-03-03 NOTE — Telephone Encounter (Signed)
Pt call back and scheduled CPE for next available which was 03/21/20 and labs prior. He also said he wanted to let dr Renold Genta know he had a pinched nerve in his neck

## 2020-03-18 ENCOUNTER — Other Ambulatory Visit: Payer: 59 | Admitting: Internal Medicine

## 2020-03-18 ENCOUNTER — Other Ambulatory Visit: Payer: Self-pay

## 2020-03-18 DIAGNOSIS — R972 Elevated prostate specific antigen [PSA]: Secondary | ICD-10-CM

## 2020-03-18 DIAGNOSIS — Z0000 Encounter for general adult medical examination without abnormal findings: Secondary | ICD-10-CM

## 2020-03-18 DIAGNOSIS — G902 Horner's syndrome: Secondary | ICD-10-CM

## 2020-03-18 DIAGNOSIS — E7849 Other hyperlipidemia: Secondary | ICD-10-CM | POA: Diagnosis not present

## 2020-03-18 DIAGNOSIS — D099 Carcinoma in situ, unspecified: Secondary | ICD-10-CM | POA: Diagnosis not present

## 2020-03-18 DIAGNOSIS — Z951 Presence of aortocoronary bypass graft: Secondary | ICD-10-CM

## 2020-03-18 DIAGNOSIS — Z8673 Personal history of transient ischemic attack (TIA), and cerebral infarction without residual deficits: Secondary | ICD-10-CM | POA: Diagnosis not present

## 2020-03-19 LAB — COMPLETE METABOLIC PANEL WITH GFR
AG Ratio: 1.9 (calc) (ref 1.0–2.5)
ALT: 22 U/L (ref 9–46)
AST: 26 U/L (ref 10–35)
Albumin: 4.4 g/dL (ref 3.6–5.1)
Alkaline phosphatase (APISO): 54 U/L (ref 35–144)
BUN: 14 mg/dL (ref 7–25)
CO2: 30 mmol/L (ref 20–32)
Calcium: 9.5 mg/dL (ref 8.6–10.3)
Chloride: 103 mmol/L (ref 98–110)
Creat: 1.19 mg/dL (ref 0.70–1.25)
GFR, Est African American: 72 mL/min/{1.73_m2} (ref >=60)
GFR, Est Non African American: 62 mL/min/{1.73_m2} (ref >=60)
Globulin: 2.3 g/dL (calc) (ref 1.9–3.7)
Glucose, Bld: 109 mg/dL — ABNORMAL HIGH (ref 65–99)
Potassium: 4.1 mmol/L (ref 3.5–5.3)
Sodium: 143 mmol/L (ref 135–146)
Total Bilirubin: 0.5 mg/dL (ref 0.2–1.2)
Total Protein: 6.7 g/dL (ref 6.1–8.1)

## 2020-03-19 LAB — CBC WITH DIFFERENTIAL/PLATELET
Absolute Monocytes: 452 cells/uL (ref 200–950)
Basophils Absolute: 82 cells/uL (ref 0–200)
Basophils Relative: 1.9 %
Eosinophils Absolute: 344 cells/uL (ref 15–500)
Eosinophils Relative: 8 %
HCT: 47.2 % (ref 38.5–50.0)
Hemoglobin: 16 g/dL (ref 13.2–17.1)
Lymphs Abs: 636 cells/uL — ABNORMAL LOW (ref 850–3900)
MCH: 28.6 pg (ref 27.0–33.0)
MCHC: 33.9 g/dL (ref 32.0–36.0)
MCV: 84.3 fL (ref 80.0–100.0)
MPV: 9.4 fL (ref 7.5–12.5)
Monocytes Relative: 10.5 %
Neutro Abs: 2786 cells/uL (ref 1500–7800)
Neutrophils Relative %: 64.8 %
Platelets: 192 10*3/uL (ref 140–400)
RBC: 5.6 10*6/uL (ref 4.20–5.80)
RDW: 15.4 % — ABNORMAL HIGH (ref 11.0–15.0)
Total Lymphocyte: 14.8 %
WBC: 4.3 10*3/uL (ref 3.8–10.8)

## 2020-03-19 LAB — LIPID PANEL
Cholesterol: 169 mg/dL (ref <200)
HDL: 74 mg/dL (ref >=40)
LDL Cholesterol (Calc): 68 mg/dL (calc)
Non-HDL Cholesterol (Calc): 95 mg/dL (calc) (ref <130)
Total CHOL/HDL Ratio: 2.3 (calc) (ref <5.0)
Triglycerides: 197 mg/dL — ABNORMAL HIGH (ref <150)

## 2020-03-19 LAB — TSH: TSH: 2.91 mIU/L (ref 0.40–4.50)

## 2020-03-19 LAB — PSA: PSA: 12.6 ng/mL — ABNORMAL HIGH (ref <=4.0)

## 2020-03-21 ENCOUNTER — Ambulatory Visit: Payer: 59 | Admitting: Internal Medicine

## 2020-03-21 ENCOUNTER — Other Ambulatory Visit (HOSPITAL_BASED_OUTPATIENT_CLINIC_OR_DEPARTMENT_OTHER): Payer: Self-pay | Admitting: Internal Medicine

## 2020-03-21 ENCOUNTER — Encounter: Payer: Self-pay | Admitting: Internal Medicine

## 2020-03-21 ENCOUNTER — Other Ambulatory Visit: Payer: Self-pay

## 2020-03-21 ENCOUNTER — Telehealth: Payer: Self-pay | Admitting: Internal Medicine

## 2020-03-21 VITALS — BP 120/80 | HR 64 | Ht 67.0 in | Wt 160.0 lb

## 2020-03-21 DIAGNOSIS — Z8659 Personal history of other mental and behavioral disorders: Secondary | ICD-10-CM | POA: Diagnosis not present

## 2020-03-21 DIAGNOSIS — E7849 Other hyperlipidemia: Secondary | ICD-10-CM

## 2020-03-21 DIAGNOSIS — M5090 Cervical disc disorder, unspecified, unspecified cervical region: Secondary | ICD-10-CM

## 2020-03-21 DIAGNOSIS — D0007 Carcinoma in situ of tongue: Secondary | ICD-10-CM | POA: Diagnosis not present

## 2020-03-21 DIAGNOSIS — I6521 Occlusion and stenosis of right carotid artery: Secondary | ICD-10-CM

## 2020-03-21 DIAGNOSIS — R1319 Other dysphagia: Secondary | ICD-10-CM

## 2020-03-21 DIAGNOSIS — Z8709 Personal history of other diseases of the respiratory system: Secondary | ICD-10-CM | POA: Diagnosis not present

## 2020-03-21 DIAGNOSIS — R1310 Dysphagia, unspecified: Secondary | ICD-10-CM | POA: Diagnosis not present

## 2020-03-21 DIAGNOSIS — Z789 Other specified health status: Secondary | ICD-10-CM | POA: Diagnosis not present

## 2020-03-21 DIAGNOSIS — Z951 Presence of aortocoronary bypass graft: Secondary | ICD-10-CM | POA: Diagnosis not present

## 2020-03-21 DIAGNOSIS — R972 Elevated prostate specific antigen [PSA]: Secondary | ICD-10-CM | POA: Diagnosis not present

## 2020-03-21 DIAGNOSIS — R809 Proteinuria, unspecified: Secondary | ICD-10-CM | POA: Diagnosis not present

## 2020-03-21 DIAGNOSIS — E039 Hypothyroidism, unspecified: Secondary | ICD-10-CM | POA: Diagnosis not present

## 2020-03-21 DIAGNOSIS — I10 Essential (primary) hypertension: Secondary | ICD-10-CM

## 2020-03-21 DIAGNOSIS — Z0000 Encounter for general adult medical examination without abnormal findings: Secondary | ICD-10-CM

## 2020-03-21 DIAGNOSIS — Z8673 Personal history of transient ischemic attack (TIA), and cerebral infarction without residual deficits: Secondary | ICD-10-CM | POA: Diagnosis not present

## 2020-03-21 LAB — POCT URINALYSIS DIPSTICK
Appearance: NEGATIVE
Bilirubin, UA: NEGATIVE
Blood, UA: NEGATIVE
Glucose, UA: NEGATIVE
Ketones, UA: NEGATIVE
Leukocytes, UA: NEGATIVE
Nitrite, UA: NEGATIVE
Odor: NEGATIVE
Protein, UA: POSITIVE — AB
Spec Grav, UA: 1.015 (ref 1.010–1.025)
Urobilinogen, UA: 0.2 E.U./dL
pH, UA: 6 (ref 5.0–8.0)

## 2020-03-21 NOTE — Telephone Encounter (Signed)
Faxed lab results to Dr Lawerance [facility] at [phone], phone number [phone]

## 2020-03-21 NOTE — Progress Notes (Signed)
Subjective:    Patient ID: [name], male    DOB: 01-22-1951, 69 y.o.   MRN: [id number]  HPI 69 year old Male seen for health maintenance exam and evaluation of medical issues.  Patient has history of elevated PSA.  He was scheduled to see Dr. Lawerance Bach several months ago but developed acute severe low back pain and had lumbar decompression L2-L3 with discectomy by Dr. Rolena Infante in September 2020.  Patient had an appointment today to see Dr. Rosana Hoes but canceled it for this appointment and will need to reschedule with Dr. Rosana Hoes.  His PSA has increased from 6.6-12.6 since March 2020.  Patient would like to have his skin checked and we will make referral to Dr. Denna Haggard  He has developed worsening neck pain.  He has numbness on his left face but not his left neck.  Intermittent paresthesias down left arm.  He will be referred back to Dr. Melven Sartorius request.  He also is having some issues with swallowing.  He had colonoscopy and upper endoscopy in May 2019.  Had 3 small sessile polyps on colonoscopy.  At the time he was having dysphagia and history of GE reflux.  He had a normal stomach and normal duodenum.  There were esophageal mucosal changes but no evidence of cancer or eosinophilic esophagitis.  Biopsies were consistent with esophagitis and polyps were hyperplastic.  He saw Dr. Ander Slade, pulmonologist in December for shortness of breath and was treated with an inhaler.  History of coronary artery disease followed by Dr. Alvester Chou.  He has a history of hyperlipidemia but is statin intolerant.  History of blood pressure differential due to subclavian stenosis.  He has a history of hypertension.  History of low white blood cell count in the 3700 range which has been followed for several years and is asymptomatic.  History of depression, hypothyroidism, status post CABG x5 in 2009, history of smoking, history of left Horner syndrome, history of stroke, cervical disc disease, squamous cell carcinoma of the  tongue, external carotid artery stenosis on the left, history of migraine headaches, allergic rhinitis, attention deficit disorder and history of back pain.  In September 2017 he had PCI and drug-eluting stent placement of a high-grade circumflex and first obtuse marginal branch stenosis in the setting of an occluded sequential vein graft with a patent continuation of OM1-OM 2.  History of 70% right subclavian artery stenosis.  Percutaneous intervention has been considered but has not been done.  History of left shoulder arthroscopy with subacromial decompression and open rotator cuff repair by Dr. Alma Friendly 2014.  Open distal clavicle resection and open biceps tendodesis.  He has a 50 mm upper extremity blood pressure differential due to right subclavian and/or innominate stenosis.  He is on chronic anticoagulation with Plavix.  He is on lorazepam 1 mg every 8 hours as needed for anxiety.  He is on Trintellix for depression.  He takes Protonix for GE reflux.  He is statin intolerant but Dr. Gwenlyn Found has been on Praluent 150 mg injection subcu q. 14 days.  In January 2018 he was seen in the emergency department with palpitations, diaphoresis and lightheadedness.  D-dimer was elevated at 0.75.  CT of the chest angio with contrast showed no evidence of PE.  He was thought not to have an MI and was discharged home.  CT angios of chest and aorta with and without contrast have showed 70% proximal right subclavian artery stenosis.  Mild plaque in left subclavian artery.  Mild  plaque in distal right subclavian artery.  He has chronic occlusion of the distal right vertebral artery.  He had discectomy of the C-spine around 2000.  He developed left eye ptosis after cardiac surgery.  Says his face feels numb when he turns his head to the left.  He had a left thalamic stroke in June 2005.  History of numbness of the left cheek in the C3 distribution.  Documented facet disease on the left at C2-C3, C4-C5, C5-C6  as well as C6-C7 which is aggravated by turning his head.  History of GE reflux  Says he had viral hepatitis a number of years ago.  Social history: Wife works for [employer].  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  He smoked for some 30 years but quit over 20 years ago.  1 son from previous marriage.  This is his second marriage.  He does drink beer.  Family history: Mother died of ovarian cancer.  Father died apparently from heart failure but also had history of stroke around age 56 and diabetes.  Maternal grandmother, paternal grandmother with history of cancer.  He has 2 brothers.  His previous neurologist, Dr. Erling Cruz who is now retired, felt patient's carotid disease was due to radiation therapy.  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     Review of Systems issues with swallowing- had endoscopy 2019 by Dr. Collene Mares dx with esophagitis.     Objective:   Physical Exam Blood pressure 120/80 BMI 25.06 pulse 64, he is afebrile, weight 160 pounds, pulse oximetry 98% height 5 feet 7 inches.  Skin warm and dry.  No cervical adenopathy.  TMs are clear.  Decreased sensation left face.  Neck is supple.  Chest clear to auscultation.  Cardiac exam regular rate and rhythm normal S1 and S2 without murmurs or gallops.  Abdomen soft nondistended without hepatosplenomegaly masses or tenderness.  Rectal exam deferred to urologist, Dr. Rosana Hoes.  No lower extremity edema.  Neuro: Cranial nerves II through XII grossly intact.  Appears to have decreased sensation in left face.  Moves all 4 extremities.  Normal muscle strength.       Assessment & Plan:   Hx of SOB- saw Dr Ander Slade December-treated with an inhaler.  Patient quit smoking in 1995.  Was thought by pulmonologist probably have obstructive lung disease.  Pulmonary functions were recommended but have not been  done yet during the pandemic.  We will make referral for him to see the pulmonologist and get pulmonary function test soon.  History of hyperinflated lung fields on chest x-ray.  It was thought he might have air trapping contributing to his shortness of breath.  Hyperlipidemia treated with Praluent  Essential hypertension-stable  History of coronary artery disease status post CABG  Status post PCI by Dr. Gwenlyn Found  History of carcinoma base of tongue  History of carotid disease  History of cervical disc disease  Hypothyroidism stable on thyroid replacement  History of left thalamic stroke 2005  History of left Horner's syndrome  History of right vertebral artery occlusion  Elevated PSA-to see urologist in the near future as this has significantly increased over the past year  Subclavian artery stenosis causing blood pressure discrepancy in arms  History of 50% stenosis right proximal internal carotid artery and severe narrowing of the right external carotid artery  Anxiety depression-stable  History  of left shoulder arthropathy status post surgery  GE reflux treated with PPI  Erectile dysfunction treated with Cialis  Chronic anticoagulation treated with Plavix  Plan: He would like to have skin check- we will make referral to dermatologist.  He would like to see Dr. Vertell Limber once again we will make referral.  We will refer him back to Dr. Ander Slade regarding pulmonary function testing which was not done during the pandemic.    Patient is to call Dr. Rosana Hoes, urologist, regarding rescheduling his appointment as he has a significant elevation in his PSA.  Return in 1 year or as needed.  Order for Shingrix vaccine written.  Lab results faxed to Dr. Rosana Hoes.

## 2020-03-21 NOTE — Patient Instructions (Addendum)
Referral to Dr. Vertell Limber regarding neck pain and left facial numbness. See Dr. Rosana Hoes regarding elevated PSA.Marland Kitchen Results of labs faxed to Dr. Rosana Hoes. Referral to Derm regarding skin check. Order for Shingrix written.  Referral to pulmonary for pulmonary function testing.

## 2020-03-22 LAB — MICROALBUMIN / CREATININE URINE RATIO
Creatinine, Urine: 431 mg/dL — ABNORMAL HIGH (ref 20–320)
Microalb Creat Ratio: 4 mcg/mg creat (ref <30)
Microalb, Ur: 1.7 mg/dL

## 2020-03-24 ENCOUNTER — Ambulatory Visit (INDEPENDENT_AMBULATORY_CARE_PROVIDER_SITE_OTHER): Payer: 59 | Admitting: Pulmonary Disease

## 2020-03-24 ENCOUNTER — Other Ambulatory Visit: Payer: Self-pay

## 2020-03-24 ENCOUNTER — Encounter: Payer: Self-pay | Admitting: Pulmonary Disease

## 2020-03-24 VITALS — BP 126/66 | HR 77 | Temp 97.7°F | Ht 68.0 in | Wt 161.0 lb

## 2020-03-24 DIAGNOSIS — Z87891 Personal history of nicotine dependence: Secondary | ICD-10-CM | POA: Diagnosis not present

## 2020-03-24 DIAGNOSIS — R0602 Shortness of breath: Secondary | ICD-10-CM | POA: Diagnosis not present

## 2020-03-24 NOTE — Progress Notes (Signed)
@Patient  ID: [name], male    DOB: 10-07-1950, 69 y.o.   MRN: [id number]  Chief Complaint  Patient presents with  . Follow-up    Dyspnea on exertion    Referring provider: [name], MD  HPI:  69 year old male former smoker initially referred to office in December/2020 for dyspnea  PMH: Hypertension, depression, GERD, anxiety, CAD Smoker/ Smoking History: Former smoker.  Quit 2002.  90-pack-year smoking history. Maintenance: None Pt of: Dr. Jenetta Downer  03/24/2020  - Visit   69 year old male former smoker initially referred to our office in December/2020 for evaluation of dyspnea on exertion.  He is followed by Dr. Jenetta Downer.  Patient presenting back to our office today as a follow-up.  At last office visit when patient was seen in December/2020 is recommended that he use albuterol as needed.  He was asked to obtain pulmonary function testing.  He was allowed to increase his exercise as tolerated.  Based off of pulmonary function testing may need to consider inhalers.  He was asked to follow-up in 6 weeks.  This was never completed.  Patient presented back to our office today at request of primary care.  He reports that he has been using his albuterol maybe 2-3 times a week.  He feels clinical relief from this.  He would like to get scheduled to obtain his pulmonary function testing.  We will discuss this today.  He has no current pulmonary complaints.  Patient does report that he occasionally has trouble swallowing.  He reports this is been an ongoing chronic issue.  He denies any history of aspiration pneumonia or recurrent pneumonias.  Questionaires / Pulmonary Flowsheets:   ACT:  No flowsheet data found.  MMRC: No flowsheet data found.  Epworth:  No flowsheet data found.  Tests:   03/18/2020-CBC with differential-hemoglobin 16, eosinophils 344 06/10/2019-chest x-ray-no active cardiopulmonary disease  10/24/2016-CT angio chest-no acute findings in  chest  03/19/2016-echocardiogram-LV ejection fraction 60 to 67%, grade 2 diastolic dysfunction mild mitral valve regurgitation 06/14/2016-cardiac cath  Mid Cx to Dist Cx lesion, 100 %stenosed.  SVG.  Origin lesion, 100 %stenosed.  Prox Cx to Mid Cx lesion, 80 %stenosed.  Post intervention, there is a 0% residual stenosis.  A stent was successfully placed.  Ost 2nd Mrg to 2nd Mrg lesion, 95 %stenosed.  Post intervention, there is a 0% residual stenosis.  A stent was successfully placed.   05/14/2016-cardiac cath  Ost RCA to Prox RCA lesion, 100 %stenosed.  Ost 1st Diag to 1st Diag lesion, 100 %stenosed.  Mid Cx to Dist Cx lesion, 100 %stenosed.  Ost 2nd Mrg to 2nd Mrg lesion, 90 %stenosed.  Prox Cx to Mid Cx lesion, 70 %stenosed.  Origin to Prox Graft lesion, 100 %stenosed.  Origin to Dist Graft lesion, 100 %stenosed.  LIMA.  The left ventricular systolic function is normal.  LV end diastolic pressure is normal.  The left ventricular ejection fraction is 55-65% by visual estimate.   FENO:  No results found for: NITRICOXIDE  PFT: No flowsheet data found.  WALK:  No flowsheet data found.  Imaging: No results found.  Lab Results:  CBC    Component Value Date/Time   WBC 4.3 03/18/2020 1009   RBC 5.60 03/18/2020 1009   HGB 16.0 03/18/2020 1009   HCT 47.2 03/18/2020 1009   PLT 192 03/18/2020 1009   MCV 84.3 03/18/2020 1009   MCH 28.6 03/18/2020 1009   MCHC 33.9 03/18/2020 1009   RDW 15.4 (H) 03/18/2020  1009   LYMPHSABS 636 (L) 03/18/2020 1009   MONOABS 320 01/01/2017 1234   EOSABS 344 03/18/2020 1009   BASOSABS 82 03/18/2020 1009    BMET    Component Value Date/Time   NA 143 03/18/2020 1009   K 4.1 03/18/2020 1009   CL 103 03/18/2020 1009   CO2 30 03/18/2020 1009   GLUCOSE 109 (H) 03/18/2020 1009   BUN 14 03/18/2020 1009   CREATININE 1.19 03/18/2020 1009   CALCIUM 9.5 03/18/2020 1009   GFRNONAA 62 03/18/2020 1009   GFRAA 72 03/18/2020  1009    BNP    Component Value Date/Time   BNP 36.3 10/24/2016 1820    ProBNP No results found for: PROBNP  Specialty Problems      Pulmonary Problems   SOB (shortness of breath)      Allergies  Allergen Reactions  . Crestor [Rosuvastatin Calcium] Other (See Comments)    Difficulty swallowing  . Tramadol Shortness Of Breath  . Lisinopril Cough  . Atorvastatin   . Gabapentin Other (See Comments)    Hallucinations  . Tricor [Fenofibrate] Other (See Comments)    UNSPECIFIED REACTION  . Lipitor [Atorvastatin Calcium] Other (See Comments)    Muscle spasms    Immunization History  Administered Date(s) Administered  . Influenza Split 06/28/2011  . Influenza Whole 07/18/2010  . Influenza, High Dose Seasonal PF 08/07/2018  . Influenza,inj,Quad PF,6+ Mos 09/03/2013, 09/09/2014, 06/15/2016, 07/19/2017, 06/09/2019  . Influenza-Unspecified 10/18/2015  . PFIZER SARS-COV-2 Vaccination 10/21/2019, 11/12/2019  . Tdap 12/25/2010, 11/20/2012  . Zoster 06/28/2011    Past Medical History:  Diagnosis Date  . Anxiety   . Arthritis    "NECK, HANDS, KNEES" (06/14/2016)  . CAD (coronary artery disease)   . Carotid artery stenosis   . Complication of anesthesia    Reports that he had trouble swallowing post anesthesia- CABG  . COPD (chronic obstructive pulmonary disease) ([place]) 12/11/2011   r/s mv - EF 72%; exercise capacity 13 METS; no exercised induced ischemic EKG changes  . Depression, major   . Dyspnea    with exertion  . GERD (gastroesophageal reflux disease)   . HTN (hypertension), benign 04/16/2008   echo - EF >55%; no regional wall or valvular abnormalities  . Hyperlipidemia    statin intolerant  . Hypothyroidism   . Migraine    "crippling headaches as a kid; silent migraines now, no pain, couple times/week" (06/14/2016)  . Prostate cancer ([place]) 04/2019   recent dx - Dr Lawerance Bach  . S/P angioplasty with stent 06/14/16 PCI & DES of high grade AV groove LCX and 1st OM   06/15/2016  . Squamous cell cancer of tongue ([place]) ~ 2002   "35 [facility] at [facility]  . Subclavian steal syndrome 06/17/2012   carotid doppler - R systolic brachial pressure 163mmHg, L 169mmHg; R subclavian artery - proxmial obsstruction w/ abnormal monophasic waveforms, R ECA known occlusive disease; L ECA narrowing w/ 70-99% diameter reductiona  . TIA (transient ischemic attack) 2005  . Viral hepatitis 1970s   "non specific"    Tobacco History: Social History   Tobacco Use  Smoking Status Former Smoker  . Packs/day: 3.00  . Years: 30.00  . Pack years: 90.00  . Types: Cigarettes  . Quit date: 01/29/2001  . Years since quitting: 19.1  Smokeless Tobacco Never Used   Counseling given: Yes   Continue to not smoke  Outpatient Encounter Medications as of 03/24/2020  Medication Sig  . albuterol (VENTOLIN HFA)  108 (90 Base) MCG/ACT inhaler Inhale 2 puffs into the lungs every 6 (six) hours as needed for wheezing or shortness of breath.  Marland Kitchen amLODipine (NORVASC) 5 MG tablet Take 5 mg by mouth daily.  Marland Kitchen amphetamine-dextroamphetamine (ADDERALL) 10 MG tablet Take 10 mg by mouth 3 (three) times daily.   . clopidogrel (PLAVIX) 75 MG tablet TAKE 1 TABLET (75 MG TOTAL) BY MOUTH DAILY.  . hydrochlorothiazide (MICROZIDE) 12.5 MG capsule TAKE 1 CAPSULE BY MOUTH DAILY (Patient taking differently: Take 12.5 mg by mouth daily. )  . levothyroxine (SYNTHROID) 75 MCG tablet TAKE 1 TABLET BY MOUTH ONCE DAILY  . LORazepam (ATIVAN) 1 MG tablet Take 1 tablet (1 mg total) by mouth every 8 (eight) hours as needed for anxiety. (Patient taking differently: Take 1 mg by mouth 4 (four) times daily as needed for anxiety. )  . pantoprazole (PROTONIX) 40 MG tablet TAKE 1 TABLET (40 MG TOTAL) BY MOUTH DAILY.  Marland Kitchen PRALUENT 150 MG/ML SOAJ INJECT 150 MG INTO THE SKIN EVERY 14 (FOURTEEN) DAYS.  Marland Kitchen tadalafil (CIALIS) 20 MG tablet TAKE 1 TABLET (20 MG TOTAL) BY MOUTH DAILY AS NEEDED FOR ERECTILE DYSFUNCTION.  Marland Kitchen  Tetrahydrozoline HCl (VISINE OP) Place 2-3 drops into both eyes 2 (two) times daily as needed (for dry eyes).  . TRINTELLIX 10 MG TABS tablet Take 20 mg by mouth daily.    No facility-administered encounter medications on file as of 03/24/2020.     Review of Systems  Review of Systems  Constitutional: Negative for activity change, chills, fatigue, fever and unexpected weight change.  HENT: Positive for trouble swallowing. Negative for postnasal drip, rhinorrhea, sinus pressure, sinus pain and sore throat.   Eyes: Negative.   Respiratory: Positive for shortness of breath (Occasional, improved with albuterol). Negative for cough and wheezing.   Cardiovascular: Negative for chest pain and palpitations.  Gastrointestinal: Negative for constipation, diarrhea, nausea and vomiting.  Endocrine: Negative.   Genitourinary: Negative.   Musculoskeletal: Negative.   Skin: Negative.   Neurological: Negative for dizziness and headaches.  Psychiatric/Behavioral: Negative.  Negative for dysphoric mood. The patient is not nervous/anxious.   All other systems reviewed and are negative.    Physical Exam  BP 126/66 (BP Location: Left Arm, Cuff Size: Normal)   Pulse 77   Temp 97.7 F (36.5 C) (Oral)   Ht 5\' 8"  (1.727 m)   Wt 161 lb (73 kg)   SpO2 97%   BMI 24.48 kg/m   Wt Readings from Last 5 Encounters:  03/24/20 161 lb (73 kg)  03/21/20 160 lb (72.6 kg)  09/23/19 165 lb 3.2 oz (74.9 kg)  09/01/19 164 lb (74.4 kg)  07/01/19 153 lb (69.4 kg)    BMI Readings from Last 5 Encounters:  03/24/20 24.48 kg/m  03/21/20 25.06 kg/m  09/23/19 25.87 kg/m  09/01/19 25.69 kg/m  07/01/19 23.96 kg/m     Physical Exam Vitals and nursing note reviewed.  Constitutional:      General: He is not in acute distress.    Appearance: Normal appearance. He is normal weight.  HENT:     Head: Normocephalic and atraumatic.     Right Ear: Hearing, tympanic membrane, ear canal and external ear normal.  There is no impacted cerumen.     Left Ear: Hearing, tympanic membrane, ear canal and external ear normal. There is no impacted cerumen.     Nose: Nose normal. No mucosal edema or rhinorrhea.     Right Turbinates: Not enlarged.  Left Turbinates: Not enlarged.     Mouth/Throat:     Mouth: Mucous membranes are dry.     Pharynx: Oropharynx is clear. No oropharyngeal exudate.  Eyes:     Pupils: Pupils are equal, round, and reactive to light.  Cardiovascular:     Rate and Rhythm: Normal rate and regular rhythm.     Pulses: Normal pulses.     Heart sounds: Normal heart sounds. No murmur heard.   Pulmonary:     Effort: Pulmonary effort is normal.     Breath sounds: Normal breath sounds. No decreased breath sounds, wheezing or rales.  Musculoskeletal:     Cervical back: Normal range of motion.     Right lower leg: No edema.     Left lower leg: No edema.  Lymphadenopathy:     Cervical: No cervical adenopathy.  Skin:    General: Skin is warm and dry.     Capillary Refill: Capillary refill takes less than 2 seconds.     Findings: No erythema or rash.  Neurological:     General: No focal deficit present.     Mental Status: He is alert and oriented to person, place, and time.     Motor: No weakness.     Coordination: Coordination normal.     Gait: Gait is intact. Gait normal.  Psychiatric:        Mood and Affect: Mood normal.        Behavior: Behavior normal. Behavior is cooperative.        Thought Content: Thought content normal.        Judgment: Judgment normal.       Assessment & Plan:   Discussion: Discussed with patient that if he does have worsened dysphagia.  Or he starts to develop fevers.  Or he is coughing more when eating.  To notify our office.  We may need to further evaluate with chest x-ray imaging.  He denies any of these symptoms right now.  No fevers.  We will continue to clinically monitor.  SOB (shortness of breath) Suspected COPD or obstructive lung  disease 90-pack-year smoking history Clinical relief when using albuterol Using albuterol 2-3 times a week  Plan: Obtain pulmonary function testing Follow-up with our office after that After obtaining pulmonary function testing could consider maintenance inhalers  Former smoker 90-pack-year smoking history Quit 2002 Recent chest x-ray in September/2020 not abnormal  Plan: Obtain pulmonary function testing Can repeat chest x-ray in September/2021    Return in about 6 weeks (around 05/05/2020), or if symptoms worsen or fail to improve, for Follow up for FULL PFT - 60 min, Follow up with Wyn Quaker FNP-C, Follow up with Dr. Ander Slade.   Lauraine Rinne, NP 03/24/2020   This appointment required 32 minutes of patient care (this includes precharting, chart review, review of results, face-to-face care, etc.).

## 2020-03-24 NOTE — Assessment & Plan Note (Signed)
90-pack-year smoking history Quit 2002 Recent chest x-ray in September/2020 not abnormal  Plan: Obtain pulmonary function testing Can repeat chest x-ray in September/2021

## 2020-03-24 NOTE — Patient Instructions (Addendum)
You were seen today by Lauraine Rinne, NP  for:   1. SOB (shortness of breath)  Only use your albuterol as a rescue medication to be used if you can't catch your breath by resting or doing a relaxed purse lip breathing pattern.  - The less you use it, the better it will work when you need it. - Ok to use up to 2 puffs  every 4 hours if you must but call for immediate appointment if use goes up over your usual need - Don't leave home without it !!  (think of it like the spare tire for your car)   Obtain pulmonary function testing  2. Former smoker  Follow Up:    Return in about 6 weeks (around 05/05/2020), or if symptoms worsen or fail to improve, for Follow up for FULL PFT - 60 min, Follow up with Wyn Quaker FNP-C, Follow up with Dr. Ander Slade.   Please do your part to reduce the spread of COVID-19:      Reduce your risk of any infection  and COVID19 by using the similar precautions used for avoiding the common cold or flu:  Marland Kitchen Wash your hands often with soap and warm water for at least 20 seconds.  If soap and water are not readily available, use an alcohol-based hand sanitizer with at least 60% alcohol.  . If coughing or sneezing, cover your mouth and nose by coughing or sneezing into the elbow areas of your shirt or coat, into a tissue or into your sleeve (not your hands). Langley Gauss A MASK when in public  . Avoid shaking hands with others and consider head nods or verbal greetings only. . Avoid touching your eyes, nose, or mouth with unwashed hands.  . Avoid close contact with people who are sick. . Avoid places or events with large numbers of people in one location, like concerts or sporting events. . If you have some symptoms but not all symptoms, continue to monitor at home and seek medical attention if your symptoms worsen. . If you are having a medical emergency, call 911.   [place] / e-Visit:  [email or web address]         [facility]: [facility]: [phone]                   [facility]: [phone]     It is flu season:   >>> Best ways to protect herself from the flu: Receive the yearly flu vaccine, practice good hand hygiene washing with soap and also using hand sanitizer when available, eat a nutritious meals, get adequate rest, hydrate appropriately   Please contact the office if your symptoms worsen or you have concerns that you are not improving.   Thank you for choosing [facility] for your healthcare, and for allowing [place] to partner with you on your healthcare journey. I am thankful to be able to provide care to you today.   Wyn Quaker FNP-C

## 2020-03-24 NOTE — Assessment & Plan Note (Signed)
Suspected COPD or obstructive lung disease 90-pack-year smoking history Clinical relief when using albuterol Using albuterol 2-3 times a week  Plan: Obtain pulmonary function testing Follow-up with our office after that After obtaining pulmonary function testing could consider maintenance inhalers

## 2020-03-25 ENCOUNTER — Other Ambulatory Visit (HOSPITAL_COMMUNITY): Payer: Self-pay

## 2020-03-25 DIAGNOSIS — R1310 Dysphagia, unspecified: Secondary | ICD-10-CM

## 2020-03-29 MED FILL — PRALUENT 150 MG/ML SOAJ: 150 | 28 days supply | Qty: 2 | Fill #2

## 2020-04-01 MED FILL — AMPHETAMINE-DEXTROAMPHETAMI: 30 | 90 days supply | Qty: 180 | Fill #0

## 2020-04-01 MED FILL — AMPHETAMINE-DEXTROAMPHETAMI: 30 | 45 days supply | Qty: 90 | Fill #1

## 2020-04-12 ENCOUNTER — Ambulatory Visit (HOSPITAL_COMMUNITY)
Admission: RE | Admit: 2020-04-12 | Discharge: 2020-04-12 | Disposition: A | Discharge status: Home / Self Care | Payer: 59 | Source: Ambulatory Visit | Attending: Cardiology | Admitting: Cardiology

## 2020-04-12 ENCOUNTER — Other Ambulatory Visit: Payer: Self-pay

## 2020-04-12 DIAGNOSIS — I6523 Occlusion and stenosis of bilateral carotid arteries: Secondary | ICD-10-CM | POA: Diagnosis not present

## 2020-04-14 ENCOUNTER — Telehealth: Payer: Self-pay | Admitting: Internal Medicine

## 2020-04-14 NOTE — Telephone Encounter (Signed)
I have called Dr. Lawerance Bach' officeand had to leave voice mail message for his nurse requesting sooner appt.

## 2020-04-14 NOTE — Telephone Encounter (Signed)
Connar Keating [phone]  Malcom called to say that last month he canceled his appointment with Dr Almon Register to come here for his physical and he called today to reschedule appointment and the earliest he could get in was 09/26/2020. He stated they said we could send a referral and maybe get him in earlier if he needed to be seen earlier than end of December.

## 2020-04-15 ENCOUNTER — Ambulatory Visit (HOSPITAL_COMMUNITY)
Admission: RE | Admit: 2020-04-15 | Discharge: 2020-04-15 | Disposition: A | Discharge status: Home / Self Care | Payer: 59 | Source: Ambulatory Visit | Attending: Internal Medicine | Admitting: Internal Medicine

## 2020-04-15 ENCOUNTER — Other Ambulatory Visit: Payer: Self-pay

## 2020-04-15 DIAGNOSIS — R1310 Dysphagia, unspecified: Secondary | ICD-10-CM | POA: Insufficient documentation

## 2020-04-18 MED FILL — TRINTELLIX 10 MG TABLET: 10 | 30 days supply | Qty: 30 | Fill #1

## 2020-04-18 MED FILL — AMLODIPINE BESYLATE 10 MG T: 10 | 90 days supply | Qty: 90 | Fill #2

## 2020-04-22 ENCOUNTER — Other Ambulatory Visit: Payer: Self-pay

## 2020-04-22 DIAGNOSIS — I6521 Occlusion and stenosis of right carotid artery: Secondary | ICD-10-CM

## 2020-04-22 DIAGNOSIS — I6523 Occlusion and stenosis of bilateral carotid arteries: Secondary | ICD-10-CM

## 2020-04-25 DIAGNOSIS — R972 Elevated prostate specific antigen [PSA]: Secondary | ICD-10-CM | POA: Diagnosis not present

## 2020-04-27 MED FILL — PRALUENT 150 MG/ML SOAJ: 150 | 28 days supply | Qty: 2 | Fill #3

## 2020-04-27 MED FILL — TADALAFIL 20 MG TABS: 20 | 30 days supply | Qty: 6 | Fill #5

## 2020-04-28 ENCOUNTER — Other Ambulatory Visit: Payer: Self-pay | Admitting: Internal Medicine

## 2020-04-28 MED FILL — PANTOPRAZOLE SOD DR 40 MG T: 40 | 90 days supply | Qty: 90 | Fill #0

## 2020-05-13 ENCOUNTER — Other Ambulatory Visit: Payer: Self-pay | Admitting: Cardiovascular Disease

## 2020-05-13 DIAGNOSIS — I10 Essential (primary) hypertension: Secondary | ICD-10-CM

## 2020-05-13 MED FILL — TRINTELLIX 10 MG TABLET: 10 | 30 days supply | Qty: 30 | Fill #2

## 2020-05-13 MED FILL — HYDROCHLOROTHIAZIDE 12.5 MG: 12.5 | 90 days supply | Qty: 90 | Fill #0

## 2020-05-16 MED FILL — LORAZEPAM 1 MG TABS: 1 | 90 days supply | Qty: 360 | Fill #0

## 2020-05-18 DIAGNOSIS — R972 Elevated prostate specific antigen [PSA]: Secondary | ICD-10-CM | POA: Diagnosis not present

## 2020-05-19 ENCOUNTER — Telehealth: Payer: Self-pay | Admitting: *Deleted

## 2020-05-19 NOTE — Telephone Encounter (Signed)
° °  [facility] Pre-operative Risk Assessment      Request for surgical clearance:  1. What type of surgery is being performed? Prostate biopsy   2. When is this surgery scheduled? 06/01/20   3. What type of clearance is required (medical clearance vs. Pharmacy clearance to hold med vs. Both)? pharmacy  4. Are there any medications that need to be held prior to surgery and how long? Plavix x 5 days   5. Practice name and name of physician performing surgery? Dr. Lawerance Bach, [facility]   6. What is the office phone number? [phone]    7.   What is the office fax number? [phone]   8.   Anesthesia type (None, local, MAC, general) ?    Irving Lubbers A Amen Dargis 05/19/2020, 4:38 PM  _________________________________________________________________

## 2020-05-23 NOTE — Telephone Encounter (Signed)
Left message to call back  Kerin Ransom PA-C 05/23/2020 9:04 AM

## 2020-05-23 NOTE — Telephone Encounter (Signed)
Patient returned call

## 2020-05-23 NOTE — Telephone Encounter (Signed)
   Primary Cardiologist: Quay Burow, MD  Chart reviewed and patient contacted by phone today as part of pre-operative protocol coverage. Given past medical history and time since last visit, based on ACC/AHA guidelines, [name] would be at acceptable risk for the planned procedure without further cardiovascular testing.   Ok to hold Plavix 5 days pre op - resume as soon as safe post op.  I will route this recommendation to the requesting party via Epic fax function and remove from pre-op pool.  Please call with questions.  Kerin Ransom, PA-C 05/23/2020, 9:32 AM

## 2020-05-26 DIAGNOSIS — I2510 Atherosclerotic heart disease of native coronary artery without angina pectoris: Secondary | ICD-10-CM | POA: Diagnosis not present

## 2020-05-26 DIAGNOSIS — R972 Elevated prostate specific antigen [PSA]: Secondary | ICD-10-CM | POA: Diagnosis not present

## 2020-05-26 DIAGNOSIS — K219 Gastro-esophageal reflux disease without esophagitis: Secondary | ICD-10-CM | POA: Diagnosis not present

## 2020-05-26 DIAGNOSIS — I6529 Occlusion and stenosis of unspecified carotid artery: Secondary | ICD-10-CM | POA: Diagnosis not present

## 2020-05-26 DIAGNOSIS — I10 Essential (primary) hypertension: Secondary | ICD-10-CM | POA: Diagnosis not present

## 2020-05-26 DIAGNOSIS — E039 Hypothyroidism, unspecified: Secondary | ICD-10-CM | POA: Diagnosis not present

## 2020-05-26 DIAGNOSIS — C61 Malignant neoplasm of prostate: Secondary | ICD-10-CM | POA: Diagnosis not present

## 2020-05-26 DIAGNOSIS — F419 Anxiety disorder, unspecified: Secondary | ICD-10-CM | POA: Diagnosis not present

## 2020-05-26 DIAGNOSIS — F329 Major depressive disorder, single episode, unspecified: Secondary | ICD-10-CM | POA: Diagnosis not present

## 2020-05-31 MED FILL — PRALUENT 150 MG/ML SOAJ: 150 | 28 days supply | Qty: 2 | Fill #4

## 2020-06-01 DIAGNOSIS — F329 Major depressive disorder, single episode, unspecified: Secondary | ICD-10-CM | POA: Diagnosis not present

## 2020-06-01 DIAGNOSIS — I6529 Occlusion and stenosis of unspecified carotid artery: Secondary | ICD-10-CM | POA: Diagnosis not present

## 2020-06-01 DIAGNOSIS — K219 Gastro-esophageal reflux disease without esophagitis: Secondary | ICD-10-CM | POA: Diagnosis not present

## 2020-06-01 DIAGNOSIS — I10 Essential (primary) hypertension: Secondary | ICD-10-CM | POA: Diagnosis not present

## 2020-06-01 DIAGNOSIS — E039 Hypothyroidism, unspecified: Secondary | ICD-10-CM | POA: Diagnosis not present

## 2020-06-01 DIAGNOSIS — F419 Anxiety disorder, unspecified: Secondary | ICD-10-CM | POA: Diagnosis not present

## 2020-06-01 DIAGNOSIS — C61 Malignant neoplasm of prostate: Secondary | ICD-10-CM | POA: Diagnosis not present

## 2020-06-01 DIAGNOSIS — I2510 Atherosclerotic heart disease of native coronary artery without angina pectoris: Secondary | ICD-10-CM | POA: Diagnosis not present

## 2020-06-01 DIAGNOSIS — R972 Elevated prostate specific antigen [PSA]: Secondary | ICD-10-CM | POA: Diagnosis not present

## 2020-06-01 MED FILL — HYDROCODON-APAP 5-325: 5-325 | 2 days supply | Qty: 5 | Fill #0

## 2020-06-07 ENCOUNTER — Other Ambulatory Visit: Payer: Self-pay | Admitting: Internal Medicine

## 2020-06-07 MED FILL — TRINTELLIX 10 MG TABLET: 10 | 30 days supply | Qty: 30 | Fill #3

## 2020-06-07 MED FILL — LEVOTHYROXINE SODIUM 75 MCG: 75 | 90 days supply | Qty: 90 | Fill #0

## 2020-06-07 MED FILL — TADALAFIL 20 MG TABS: 20 | 30 days supply | Qty: 6 | Fill #6

## 2020-06-07 MED FILL — CLOPIDOGREL 75 MG TABLET: 75 | 90 days supply | Qty: 90 | Fill #1

## 2020-06-22 DIAGNOSIS — C61 Malignant neoplasm of prostate: Secondary | ICD-10-CM | POA: Diagnosis not present

## 2020-06-27 ENCOUNTER — Ambulatory Visit: Payer: 59 | Attending: Internal Medicine

## 2020-06-27 ENCOUNTER — Other Ambulatory Visit (HOSPITAL_BASED_OUTPATIENT_CLINIC_OR_DEPARTMENT_OTHER): Payer: Self-pay | Admitting: Internal Medicine

## 2020-06-27 ENCOUNTER — Other Ambulatory Visit: Payer: Self-pay

## 2020-06-27 DIAGNOSIS — Z23 Encounter for immunization: Secondary | ICD-10-CM

## 2020-06-27 MED FILL — PRALUENT 150 MG/ML SOAJ: 150 | 28 days supply | Qty: 2 | Fill #5

## 2020-06-27 MED FILL — FLUAD QUADRIVALENT 0.5 ML P: 0.5 | 1 days supply | Qty: 1 | Fill #0

## 2020-07-05 MED FILL — PFIZER-BIONTECH COVID-19 VA: 30 | 1 days supply | Qty: 0 | Fill #0

## 2020-07-06 DIAGNOSIS — R972 Elevated prostate specific antigen [PSA]: Secondary | ICD-10-CM | POA: Diagnosis not present

## 2020-07-06 DIAGNOSIS — C61 Malignant neoplasm of prostate: Secondary | ICD-10-CM | POA: Diagnosis not present

## 2020-07-08 DIAGNOSIS — M542 Cervicalgia: Secondary | ICD-10-CM | POA: Diagnosis not present

## 2020-07-08 DIAGNOSIS — M47812 Spondylosis without myelopathy or radiculopathy, cervical region: Secondary | ICD-10-CM | POA: Diagnosis not present

## 2020-07-08 DIAGNOSIS — M4312 Spondylolisthesis, cervical region: Secondary | ICD-10-CM | POA: Diagnosis not present

## 2020-07-08 DIAGNOSIS — I10 Essential (primary) hypertension: Secondary | ICD-10-CM | POA: Diagnosis not present

## 2020-07-12 ENCOUNTER — Ambulatory Visit: Payer: 59 | Admitting: Physician Assistant

## 2020-07-12 DIAGNOSIS — R972 Elevated prostate specific antigen [PSA]: Secondary | ICD-10-CM | POA: Diagnosis not present

## 2020-07-12 DIAGNOSIS — C61 Malignant neoplasm of prostate: Secondary | ICD-10-CM | POA: Diagnosis not present

## 2020-07-15 MED FILL — TRINTELLIX 10 MG TABLET: 10 | 30 days supply | Qty: 30 | Fill #4

## 2020-07-15 MED FILL — TADALAFIL 20 MG TABS: 20 | 30 days supply | Qty: 6 | Fill #7

## 2020-07-18 ENCOUNTER — Telehealth: Payer: Self-pay | Admitting: Cardiovascular Disease

## 2020-07-18 NOTE — Telephone Encounter (Signed)
° °  [facility] Pre-operative Risk Assessment    HEARTCARE STAFF: - Please ensure there is not already an duplicate clearance open for this procedure. - Under Visit Info/Reason for Call, type in Other and utilize the format Clearance MM/DD/YY or Clearance TBD. Do not use dashes or single digits. - If request is for dental extraction, please clarify the # of teeth to be extracted.  Request for surgical clearance:  1. What type of surgery is being performed?  Radical Prostatectomy with lymphadenectomy ,  2. When is this surgery scheduled?  11/4  3. What type of clearance is required (medical clearance vs. Pharmacy clearance to hold med vs. Both)? Both   Are there any medications that need to be held prior to surgery and how long?  clopidogrel (PLAVIX) 75 MG tablet [[id number]]  Need to hold 5 day pior too 11/4  4. Practice name and name of physician performing surgery? [facility]    5. What is the office phone number? [phone]   7.   What is the office fax number? [phone]  8.   Anesthesia type (None, local, MAC, general) ? General    Shana A Stovall 07/18/2020, 1:15 PM  _________________________________________________________________   (provider comments below)

## 2020-07-18 NOTE — Telephone Encounter (Signed)
Left VM

## 2020-07-19 ENCOUNTER — Other Ambulatory Visit (HOSPITAL_BASED_OUTPATIENT_CLINIC_OR_DEPARTMENT_OTHER): Payer: Self-pay | Admitting: Specialist

## 2020-07-19 MED FILL — SHINGRIX 50 MCG SUS: 50 | 1 days supply | Qty: 1 | Fill #0

## 2020-07-19 MED FILL — TRINTELLIX 20 MG TABLET: 20 | 30 days supply | Qty: 30 | Fill #0

## 2020-07-19 MED FILL — AMPHETAMINE-DEXTROAMPHETAMI: 30 | 90 days supply | Qty: 270 | Fill #0

## 2020-07-25 ENCOUNTER — Other Ambulatory Visit: Payer: Self-pay | Admitting: Internal Medicine

## 2020-07-25 MED FILL — AMLODIPINE BESYLATE 10 MG T: 10 | 90 days supply | Qty: 90 | Fill #0

## 2020-07-25 MED FILL — PRALUENT 150 MG/ML SOAJ: 150 | 28 days supply | Qty: 2 | Fill #6

## 2020-07-25 NOTE — Telephone Encounter (Signed)
Please call patient. We have this down as 5 mg daily. Does Cardiologist(Dr Gwenlyn Found) have this prescribed differently? We need patient to help [place] out before we make a mistake refilling this.

## 2020-07-25 NOTE — Telephone Encounter (Signed)
   Primary Cardiologist: Quay Burow, MD  Chart reviewed as part of pre-operative protocol coverage. In August 2021 patient held plavix 5 days prior to prostate biopsy and denied any complications with this. He denies changes in cardiac symptoms. No chest pain, shortness of breath, palpitations, lower leg edema, or orthopnea. Given past medical history and time since last visit, based on ACC/AHA guidelines, [name] would be at acceptable risk for the planned procedure without further cardiovascular testing.   The patient was advised that if he develops new symptoms prior to surgery to contact our office to arrange for a follow-up visit, and he verbalized understanding.  I will route this recommendation to the requesting party via Epic fax function and remove from pre-op pool.  Please call with questions.  Andreia Gandolfi Ninfa Meeker, PA-C 07/25/2020, 1:46 PM

## 2020-07-25 NOTE — Telephone Encounter (Signed)
Called twice no answer, Unable to leave message, will try again later.

## 2020-07-25 NOTE — Telephone Encounter (Signed)
He said he takes 10mg .

## 2020-07-28 DIAGNOSIS — M542 Cervicalgia: Secondary | ICD-10-CM | POA: Diagnosis not present

## 2020-07-28 DIAGNOSIS — R479 Unspecified speech disturbances: Secondary | ICD-10-CM | POA: Diagnosis not present

## 2020-08-01 DIAGNOSIS — I10 Essential (primary) hypertension: Secondary | ICD-10-CM | POA: Diagnosis not present

## 2020-08-02 DIAGNOSIS — Z951 Presence of aortocoronary bypass graft: Secondary | ICD-10-CM | POA: Diagnosis not present

## 2020-08-02 DIAGNOSIS — I2510 Atherosclerotic heart disease of native coronary artery without angina pectoris: Secondary | ICD-10-CM | POA: Diagnosis not present

## 2020-08-02 DIAGNOSIS — C61 Malignant neoplasm of prostate: Secondary | ICD-10-CM | POA: Diagnosis not present

## 2020-08-02 DIAGNOSIS — Z01818 Encounter for other preprocedural examination: Secondary | ICD-10-CM | POA: Diagnosis not present

## 2020-08-02 DIAGNOSIS — K219 Gastro-esophageal reflux disease without esophagitis: Secondary | ICD-10-CM | POA: Diagnosis not present

## 2020-08-02 DIAGNOSIS — I10 Essential (primary) hypertension: Secondary | ICD-10-CM | POA: Diagnosis not present

## 2020-08-02 MED FILL — PANTOPRAZOLE SOD DR 40 MG T: 40 | 90 days supply | Qty: 90 | Fill #1

## 2020-08-03 ENCOUNTER — Encounter: Payer: Self-pay | Admitting: Dermatology

## 2020-08-03 ENCOUNTER — Ambulatory Visit (INDEPENDENT_AMBULATORY_CARE_PROVIDER_SITE_OTHER): Payer: 59 | Admitting: Dermatology

## 2020-08-03 ENCOUNTER — Other Ambulatory Visit: Payer: Self-pay

## 2020-08-03 DIAGNOSIS — L281 Prurigo nodularis: Secondary | ICD-10-CM

## 2020-08-03 DIAGNOSIS — D1801 Hemangioma of skin and subcutaneous tissue: Secondary | ICD-10-CM | POA: Diagnosis not present

## 2020-08-03 DIAGNOSIS — Z1283 Encounter for screening for malignant neoplasm of skin: Secondary | ICD-10-CM | POA: Diagnosis not present

## 2020-08-03 DIAGNOSIS — L821 Other seborrheic keratosis: Secondary | ICD-10-CM

## 2020-08-03 MED ORDER — HALCINONIDE 0.1 % EX CREA
1.0000 | TOPICAL_CREAM | CUTANEOUS | 0 refills | Status: DC
Start: 2020-08-03 — End: 2021-05-02

## 2020-08-03 NOTE — Patient Instructions (Signed)
Sample of Halog given today apply after bathing.

## 2020-08-07 DIAGNOSIS — Z8679 Personal history of other diseases of the circulatory system: Secondary | ICD-10-CM | POA: Diagnosis not present

## 2020-08-07 DIAGNOSIS — Z01810 Encounter for preprocedural cardiovascular examination: Secondary | ICD-10-CM | POA: Diagnosis not present

## 2020-08-10 DIAGNOSIS — Z79899 Other long term (current) drug therapy: Secondary | ICD-10-CM | POA: Diagnosis not present

## 2020-08-10 DIAGNOSIS — K660 Peritoneal adhesions (postprocedural) (postinfection): Secondary | ICD-10-CM | POA: Diagnosis not present

## 2020-08-10 DIAGNOSIS — I4510 Unspecified right bundle-branch block: Secondary | ICD-10-CM | POA: Diagnosis not present

## 2020-08-10 DIAGNOSIS — I252 Old myocardial infarction: Secondary | ICD-10-CM | POA: Diagnosis not present

## 2020-08-10 DIAGNOSIS — Z87891 Personal history of nicotine dependence: Secondary | ICD-10-CM | POA: Diagnosis not present

## 2020-08-10 DIAGNOSIS — I10 Essential (primary) hypertension: Secondary | ICD-10-CM | POA: Diagnosis not present

## 2020-08-10 DIAGNOSIS — N4231 Prostatic intraepithelial neoplasia: Secondary | ICD-10-CM | POA: Diagnosis not present

## 2020-08-10 DIAGNOSIS — C61 Malignant neoplasm of prostate: Secondary | ICD-10-CM | POA: Diagnosis not present

## 2020-08-11 ENCOUNTER — Other Ambulatory Visit (HOSPITAL_BASED_OUTPATIENT_CLINIC_OR_DEPARTMENT_OTHER): Payer: Self-pay

## 2020-08-11 ENCOUNTER — Encounter: Payer: Self-pay | Admitting: Dermatology

## 2020-08-11 DIAGNOSIS — I4510 Unspecified right bundle-branch block: Secondary | ICD-10-CM | POA: Diagnosis not present

## 2020-08-11 DIAGNOSIS — I252 Old myocardial infarction: Secondary | ICD-10-CM | POA: Diagnosis not present

## 2020-08-11 DIAGNOSIS — Z79899 Other long term (current) drug therapy: Secondary | ICD-10-CM | POA: Diagnosis not present

## 2020-08-11 DIAGNOSIS — C61 Malignant neoplasm of prostate: Secondary | ICD-10-CM | POA: Diagnosis not present

## 2020-08-11 DIAGNOSIS — K660 Peritoneal adhesions (postprocedural) (postinfection): Secondary | ICD-10-CM | POA: Diagnosis not present

## 2020-08-11 DIAGNOSIS — I10 Essential (primary) hypertension: Secondary | ICD-10-CM | POA: Diagnosis not present

## 2020-08-11 DIAGNOSIS — Z87891 Personal history of nicotine dependence: Secondary | ICD-10-CM | POA: Diagnosis not present

## 2020-08-11 MED FILL — OXYBUTYNIN CL ER 5 MG TAB: 5 | 14 days supply | Qty: 14 | Fill #0

## 2020-08-11 NOTE — Progress Notes (Signed)
   Follow-Up Visit   Subjective  [name] is a 69 y.o. male who presents for the following: Annual Exam (left neck and groin dark spots x months).  General skin examination Location:  Duration:  Quality:  Associated Signs/Symptoms: Modifying Factors:  Severity:  Timing: Context:   Objective  Well appearing patient in no apparent distress; mood and affect are within normal limits.  A full examination was performed including scalp, head, eyes, ears, nose, lips, neck, chest, axillae, abdomen, back, buttocks, bilateral upper extremities, bilateral lower extremities, hands, feet, fingers, toes, fingernails, and toenails. All findings within normal limits unless otherwise noted below.   Assessment & Plan    Hemangioma of skin Mid Back  Benign okay to leave  Skin exam for malignant neoplasm Head - Anterior (Face)  Yearly skin check  Prurigo nodularis Left Thigh - Anterior  Use sample of Halog after bathing.  Try to minimize picking or scratching.  Halcinonide (HALOG) 0.1 % CREA - Left Thigh - Anterior  Seborrheic keratosis (3) Left Lower Leg - Posterior; Right Lower Leg - Posterior; Left Upper Back  Benign okay to leave.     I, Lavonna Monarch, MD, have reviewed all documentation for this visit.  The documentation on 08/11/20 for the exam, diagnosis, procedures, and orders are all accurate and complete.

## 2020-08-19 DIAGNOSIS — Z466 Encounter for fitting and adjustment of urinary device: Secondary | ICD-10-CM | POA: Diagnosis not present

## 2020-08-19 MED FILL — TRINTELLIX 20 MG TABLET: 20 | 30 days supply | Qty: 30 | Fill #1

## 2020-08-19 MED FILL — PRALUENT 150 MG/ML SOAJ: 150 | 28 days supply | Qty: 2 | Fill #7

## 2020-08-19 MED FILL — HYDROCHLOROTHIAZIDE 12.5 MG: 12.5 | 90 days supply | Qty: 90 | Fill #1

## 2020-09-19 ENCOUNTER — Other Ambulatory Visit (HOSPITAL_BASED_OUTPATIENT_CLINIC_OR_DEPARTMENT_OTHER): Payer: Self-pay | Admitting: Specialist

## 2020-09-19 MED FILL — PRALUENT 150 MG/ML SOAJ: 150 | 28 days supply | Qty: 2 | Fill #8

## 2020-09-19 MED FILL — LORazepam 1 MG TABS: 1 | 30 days supply | Qty: 120 | Fill #0

## 2020-10-03 MED FILL — TRINTELLIX 20 MG TABLET: 20 | 30 days supply | Qty: 30 | Fill #0

## 2020-10-04 ENCOUNTER — Other Ambulatory Visit (HOSPITAL_BASED_OUTPATIENT_CLINIC_OR_DEPARTMENT_OTHER): Payer: Self-pay | Admitting: Urology

## 2020-10-04 DIAGNOSIS — N393 Stress incontinence (female) (male): Secondary | ICD-10-CM | POA: Diagnosis not present

## 2020-10-04 DIAGNOSIS — C61 Malignant neoplasm of prostate: Secondary | ICD-10-CM | POA: Diagnosis not present

## 2020-10-04 DIAGNOSIS — N5231 Erectile dysfunction following radical prostatectomy: Secondary | ICD-10-CM | POA: Diagnosis not present

## 2020-10-04 MED FILL — TADALAFIL 5 MG TABS: 5 | 6 days supply | Qty: 30 | Fill #0

## 2020-10-05 DIAGNOSIS — M4312 Spondylolisthesis, cervical region: Secondary | ICD-10-CM | POA: Diagnosis not present

## 2020-10-05 DIAGNOSIS — M542 Cervicalgia: Secondary | ICD-10-CM | POA: Diagnosis not present

## 2020-10-05 DIAGNOSIS — M47812 Spondylosis without myelopathy or radiculopathy, cervical region: Secondary | ICD-10-CM | POA: Diagnosis not present

## 2020-10-18 MED FILL — PRALUENT 150 MG/ML SOAJ: 150 | 28 days supply | Qty: 2 | Fill #9

## 2020-10-18 MED FILL — AMLODIPINE BESYLATE 10 MG T: 10 | 90 days supply | Qty: 90 | Fill #1

## 2020-10-26 DIAGNOSIS — M4312 Spondylolisthesis, cervical region: Secondary | ICD-10-CM | POA: Diagnosis not present

## 2020-10-26 DIAGNOSIS — M542 Cervicalgia: Secondary | ICD-10-CM | POA: Diagnosis not present

## 2020-10-31 DIAGNOSIS — M5412 Radiculopathy, cervical region: Secondary | ICD-10-CM | POA: Diagnosis not present

## 2020-10-31 DIAGNOSIS — M4312 Spondylolisthesis, cervical region: Secondary | ICD-10-CM | POA: Diagnosis not present

## 2020-10-31 DIAGNOSIS — M542 Cervicalgia: Secondary | ICD-10-CM | POA: Diagnosis not present

## 2020-10-31 DIAGNOSIS — M47812 Spondylosis without myelopathy or radiculopathy, cervical region: Secondary | ICD-10-CM | POA: Diagnosis not present

## 2020-11-03 ENCOUNTER — Other Ambulatory Visit: Payer: Self-pay | Admitting: Internal Medicine

## 2020-11-03 ENCOUNTER — Other Ambulatory Visit (HOSPITAL_BASED_OUTPATIENT_CLINIC_OR_DEPARTMENT_OTHER): Payer: Self-pay | Admitting: Specialist

## 2020-11-03 MED FILL — PANTOPRAZOLE SOD DR 40 MG T: 40 | 90 days supply | Qty: 90 | Fill #2

## 2020-11-03 MED FILL — HYDROCHLOROTHIAZIDE 12.5 MG: 12.5 | 90 days supply | Qty: 90 | Fill #2

## 2020-11-03 MED FILL — LEVOTHYROXINE SODIUM 75 MCG: 75 | 90 days supply | Qty: 90 | Fill #0

## 2020-11-03 MED FILL — TADALAFIL 5 MG TABS: 5 | 6 days supply | Qty: 30 | Fill #1

## 2020-11-04 MED FILL — TRINTELLIX 20 MG TABLET: 20 | 30 days supply | Qty: 30 | Fill #0

## 2020-11-09 ENCOUNTER — Other Ambulatory Visit (HOSPITAL_BASED_OUTPATIENT_CLINIC_OR_DEPARTMENT_OTHER): Payer: Self-pay | Admitting: Specialist

## 2020-11-09 MED FILL — buPROPion HCL ER (XL) 150 M: 150 | 90 days supply | Qty: 270 | Fill #0

## 2020-11-09 MED FILL — LORAZEPAM 1 MG TABS: 1 | 30 days supply | Qty: 120 | Fill #0

## 2020-11-10 DIAGNOSIS — R972 Elevated prostate specific antigen [PSA]: Secondary | ICD-10-CM | POA: Diagnosis not present

## 2020-11-10 DIAGNOSIS — C61 Malignant neoplasm of prostate: Secondary | ICD-10-CM | POA: Diagnosis not present

## 2020-11-14 MED FILL — AMPHETAMINE-DEXTROAMPHETAMI: 20 | 90 days supply | Qty: 270 | Fill #0

## 2020-11-15 DIAGNOSIS — C61 Malignant neoplasm of prostate: Secondary | ICD-10-CM | POA: Diagnosis not present

## 2020-11-17 DIAGNOSIS — M47812 Spondylosis without myelopathy or radiculopathy, cervical region: Secondary | ICD-10-CM | POA: Diagnosis not present

## 2020-11-21 MED FILL — PRALUENT 150 MG/ML SOAJ: 150 | 28 days supply | Qty: 2 | Fill #10

## 2020-11-23 ENCOUNTER — Encounter: Payer: Self-pay | Admitting: Cardiovascular Disease

## 2020-11-23 ENCOUNTER — Ambulatory Visit (INDEPENDENT_AMBULATORY_CARE_PROVIDER_SITE_OTHER): Payer: 59 | Admitting: Cardiovascular Disease

## 2020-11-23 ENCOUNTER — Other Ambulatory Visit: Payer: Self-pay

## 2020-11-23 VITALS — BP 162/78 | HR 62 | Ht 67.0 in | Wt 149.8 lb

## 2020-11-23 DIAGNOSIS — I2510 Atherosclerotic heart disease of native coronary artery without angina pectoris: Secondary | ICD-10-CM | POA: Diagnosis not present

## 2020-11-23 DIAGNOSIS — E782 Mixed hyperlipidemia: Secondary | ICD-10-CM | POA: Diagnosis not present

## 2020-11-23 DIAGNOSIS — I10 Essential (primary) hypertension: Secondary | ICD-10-CM

## 2020-11-23 DIAGNOSIS — I2583 Coronary atherosclerosis due to lipid rich plaque: Secondary | ICD-10-CM | POA: Diagnosis not present

## 2020-11-23 DIAGNOSIS — I6521 Occlusion and stenosis of right carotid artery: Secondary | ICD-10-CM | POA: Diagnosis not present

## 2020-11-23 DIAGNOSIS — I6523 Occlusion and stenosis of bilateral carotid arteries: Secondary | ICD-10-CM

## 2020-11-23 DIAGNOSIS — Z95820 Peripheral vascular angioplasty status with implants and grafts: Secondary | ICD-10-CM

## 2020-11-23 NOTE — Assessment & Plan Note (Signed)
History of hyperlipidemia on Praluent with lipid profile performed 03/18/2020 revealing total cholesterol 169, LDL 68 and HDL 74.

## 2020-11-23 NOTE — Progress Notes (Signed)
11/23/2020 [name]   11-Aug-1951  [id number]  Primary Physician Baxley, Cresenciano Lick, MD Primary Cardiologist: [name] MD Garret Reddish, [place], [place]  HPI:  [name] is a 70 y.o.  thin-appearing, married Caucasian male, father of 18, grandfather to 4 grandchildren who I last saw in the office 09/01/2019.Marland Kitchen He has a history of coronary artery disease status post bypass grafting x5 by Dr. Merilynn Finland June 16, 2008, with a free LIMA to his LAD, sequential vein to the first diagonal branch, first and second obtuse marginal branches, as well as a vein to the PDA. His other problems include hypertension and hyperlipidemia. He denies chest pain or shortness of breath. I did perform cerebral angiography on him May 05, 2008, because of a question of a high-grade internal carotid artery stenosis read by the radiologist. However, this turned out to be a left external carotid artery stenosis. He does have high-grade ostial left internal mammary artery stenosis which necessitated the use of a free LIMA. He is statin intolerant. 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 A Myoview stress test performed December 11, 2011, was normal and carotid Dopplers did show a moderately severe right ICA stenosis which we have been following by duplex ultrasound. 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 Since I saw him 12 months ago he has noticed increasing dyspnea on exertion over the last several months, especially noticeable while mowing his lawn. I obtained a Myoview stress test on him based on this 03/29/16 that showed a large reversible lateral wall perfusion defect consistent with ischemia.He underwent outpatient cardiac catheterization by myself on 05/14/16 revealing severe  native and graft vessel disease with normal LV function. Specifically, his RCA vein graft was occluded to an occluded dominant right and his circumflex vein graft which was sequential to 2 obtuse marginal branches was occluded at the aorta to patent continuation between OM1 and 2. He did have native circumflex disease. Dr. Roxan Hockey saw him during his brief hospitalization to discuss the possibility of redo coronary artery bypass grafting although the consensus was that he would be better served with attempted PCI and stenting of his native circumflex coronary artery. I perform this electively on 06/14/16 and implanted a 2.25 mm x 38 mm long synergy drug-eluting stent in his AV groove circumflex into his obtuse marginal branch. He had excellent angiographic and clinical result. He was discharged on the following day. His dyspnea ultimately resolved. Since I saw him in the office 68months ago he's remained clinically stable.He does have a right shoulder orthopedic issuebut apparently he is not a surgical candidate.  He does complain of some breathing abnormality which does not does not necessarily sound like shortness of breath. He had an excellent lipid profile performed by his PCP on3/01/2019 with a total cholesterol 162, LDL 68 and HDL 72 on Praluent. He has developed increasing PSA to 13 and ultimately underwent radical prostatectomy.  He is currently on hormonal therapy for this.  Since I saw him a year ago he continues to do well.  He is continue not have treatment for his prostate cancer.  He denies chest pain or shortness of breath..   Current Meds  Medication Sig  . albuterol (VENTOLIN HFA) 108 (90 Base) MCG/ACT inhaler  Inhale 2 puffs into the lungs every 6 (six) hours as needed for wheezing or shortness of breath.  Marland Kitchen amLODipine (NORVASC) 10 MG tablet TAKE 1 TABLET BY MOUTH DAILY  . amphetamine-dextroamphetamine (ADDERALL) 20 MG tablet Take by mouth.  Marland Kitchen buPROPion (WELLBUTRIN XL) 150 MG 24  hr tablet   . Halcinonide (HALOG) 0.1 % CREA Apply 1 application topically as directed.  . hydrochlorothiazide (MICROZIDE) 12.5 MG capsule TAKE 1 CAPSULE BY MOUTH DAILY  . levothyroxine (SYNTHROID) 75 MCG tablet TAKE 1 TABLET BY MOUTH ONCE DAILY  . LORazepam (ATIVAN) 1 MG tablet Take 1 tablet (1 mg total) by mouth every 8 (eight) hours as needed for anxiety. (Patient taking differently: Take 1 mg by mouth 4 (four) times daily as needed for anxiety.)  . pantoprazole (PROTONIX) 40 MG tablet TAKE 1 TABLET (40 MG TOTAL) BY MOUTH DAILY.  Marland Kitchen PRALUENT 150 MG/ML SOAJ INJECT 150 MG INTO THE SKIN EVERY 14 (FOURTEEN) DAYS.  Marland Kitchen tadalafil (CIALIS) 5 MG tablet Take by mouth.  . Tetrahydrozoline HCl (VISINE OP) Place 2-3 drops into both eyes 2 (two) times daily as needed (for dry eyes).  . [DISCONTINUED] amphetamine-dextroamphetamine (ADDERALL) 10 MG tablet Take 10 mg by mouth 3 (three) times daily.      Allergies  Allergen Reactions  . Crestor [Rosuvastatin Calcium] Other (See Comments)    Difficulty swallowing  . Tramadol Shortness Of Breath  . Lisinopril Cough  . Atorvastatin   . Gabapentin Other (See Comments)    Hallucinations  . Tricor [Fenofibrate] Other (See Comments)    UNSPECIFIED REACTION  . Lipitor [Atorvastatin Calcium] Other (See Comments)    Muscle spasms    Social History   Socioeconomic History  . Marital status: Married    Spouse name: Not on file  . Number of children: Not on file  . Years of education: Not on file  . Highest education level: Not on file  Occupational History  . Not on file  Tobacco Use  . Smoking status: Former Smoker    Packs/day: 3.00    Years: 30.00    Pack years: 90.00    Types: Cigarettes    Quit date: 01/29/2001    Years since quitting: 19.8  . Smokeless tobacco: Never Used  Vaping Use  . Vaping Use: Never used  Substance and Sexual Activity  . Alcohol use: Yes    Alcohol/week: 35.0 standard drinks    Types: 35 Cans of beer per week     Comment: 4-5 day beers  . Drug use: No  . Sexual activity: Not on file  Other Topics Concern  . Not on file  Social History Narrative  . Not on file   Social Determinants of Health   Financial Resource Strain: Not on file  Food Insecurity: Not on file  Transportation Needs: Not on file  Physical Activity: Not on file  Stress: Not on file  Social Connections: Not on file  Intimate Partner Violence: Not on file     Review of Systems: General: negative for chills, fever, night sweats or weight changes.  Cardiovascular: negative for chest pain, dyspnea on exertion, edema, orthopnea, palpitations, paroxysmal nocturnal dyspnea or shortness of breath Dermatological: negative for rash Respiratory: negative for cough or wheezing Urologic: negative for hematuria Abdominal: negative for nausea, vomiting, diarrhea, bright red blood per rectum, melena, or hematemesis Neurologic: negative for visual changes, syncope, or dizziness All other systems reviewed and are otherwise negative except as noted above.    Blood pressure Marland Kitchen)  162/78, pulse 62, height 5\' 7"  (1.702 m), weight 149 lb 12.8 oz (67.9 kg), SpO2 98 %.  General appearance: alert and no distress Neck: no adenopathy, no JVD, supple, symmetrical, trachea midline, thyroid not enlarged, symmetric, no tenderness/mass/nodules and Bilateral carotid bruits Lungs: clear to auscultation bilaterally Heart: regular rate and rhythm, S1, S2 normal, no murmur, click, rub or gallop Extremities: extremities normal, atraumatic, no cyanosis or edema Pulses: 2+ and symmetric Skin: Skin color, texture, turgor normal. No rashes or lesions Neurologic: Alert and oriented X 3, normal strength and tone. Normal symmetric reflexes. Normal coordination and gait  EKG sinus rhythm at 64 with right bundle branch block.  I Personally reviewed this EKG.  ASSESSMENT AND PLAN:   Coronary artery disease History of CAD status post coronary artery bypass grafting  x5 by Dr. Roxan Hockey 06/16/2008 with a free LIMA to the LAD, sequential vein to the first and second obtuse marginal branch, vein to the diagonal branch and PDA.  I catheterized him 05/18/2016 after Myoview showed a large lateral perfusion defect.  This revealed an occluded RCA vein graft and RCA.  The circumflex vein graft was occluded as well .  Dr. Koleen Nimrod was consulted about possibly redo bypass surgery however the decision was to perform percutaneous intervention.  I electively catheterized him 06/14/2016 and implanted a 2.25 x 30 mm long Synergy drug-eluting stent in the AV groove circumflex into the obtuse marginal branch with an excellent result.  He was discharged home the following day.  Has had no recurrent symptoms.  Hypertension History of essential hypertension a blood pressure measured today at 162/78.  At home he says his blood pressure runs in the 140/80 range.  He is on amlodipine, hydrochlorothiazide.  Internal carotid artery stenosis History of carotid artery disease with Doppler performed 04/12/2020 revealing moderate bilateral ICA stenosis right greater than left.  This will be repeated in July of this year.  Hyperlipidemia History of hyperlipidemia on Praluent with lipid profile performed 03/18/2020 revealing total cholesterol 169, LDL 68 and HDL 74.      [name] MD [facility], [facility] 11/23/2020 3:36 PM

## 2020-11-23 NOTE — Assessment & Plan Note (Signed)
History of carotid artery disease with Doppler performed 04/12/2020 revealing moderate bilateral ICA stenosis right greater than left.  This will be repeated in July of this year.

## 2020-11-23 NOTE — Patient Instructions (Signed)
Medication Instructions:  Your physician recommends that you continue on your current medications as directed. Please refer to the Current Medication list given to you today.  *If you need a refill on your cardiac medications before your next appointment, please call your pharmacy*  Testing/Procedures: Your physician has requested that you have a carotid duplex. This test is an ultrasound of the carotid arteries in your neck. It looks at blood flow through these arteries that supply the brain with blood. Allow one hour for this exam. There are no restrictions or special instructions. This procedure is performed at [facility]. 2nd Floor. To be done in July 2022.   Follow-Up: At [facility], you and your health needs are our priority.  As part of our continuing mission to provide you with exceptional heart care, we have created designated Provider Care Teams.  These Care Teams include your primary Cardiologist (physician) and Advanced Practice Providers (APPs -  Physician Assistants and Nurse Practitioners) who all work together to provide you with the care you need, when you need it.  We recommend signing up for the patient portal called "MyChart".  Sign up information is provided on this After Visit Summary.  MyChart is used to connect with patients for Virtual Visits (Telemedicine).  Patients are able to view lab/test results, encounter notes, upcoming appointments, etc.  Non-urgent messages can be sent to your provider as well.   To learn more about what you can do with MyChart, go to [email or web address].    Your next appointment:   12 month(s)  The format for your next appointment:   In Person  Provider:   Quay Burow, MD

## 2020-11-23 NOTE — Assessment & Plan Note (Signed)
History of CAD status post coronary artery bypass grafting x5 by Dr. Roxan Hockey 06/16/2008 with a free LIMA to the LAD, sequential vein to the first and second obtuse marginal branch, vein to the diagonal branch and PDA.  I catheterized him 05/18/2016 after Myoview showed a large lateral perfusion defect.  This revealed an occluded RCA vein graft and RCA.  The circumflex vein graft was occluded as well .  Dr. Koleen Nimrod was consulted about possibly redo bypass surgery however the decision was to perform percutaneous intervention.  I electively catheterized him 06/14/2016 and implanted a 2.25 x 30 mm long Synergy drug-eluting stent in the AV groove circumflex into the obtuse marginal branch with an excellent result.  He was discharged home the following day.  Has had no recurrent symptoms.

## 2020-11-23 NOTE — Assessment & Plan Note (Signed)
History of essential hypertension a blood pressure measured today at 162/78.  At home he says his blood pressure runs in the 140/80 range.  He is on amlodipine, hydrochlorothiazide.

## 2020-11-25 DIAGNOSIS — Z5111 Encounter for antineoplastic chemotherapy: Secondary | ICD-10-CM | POA: Diagnosis not present

## 2020-11-25 DIAGNOSIS — Z79899 Other long term (current) drug therapy: Secondary | ICD-10-CM | POA: Diagnosis not present

## 2020-11-25 DIAGNOSIS — C61 Malignant neoplasm of prostate: Secondary | ICD-10-CM | POA: Diagnosis not present

## 2020-12-01 ENCOUNTER — Other Ambulatory Visit: Payer: Self-pay | Admitting: Oncology

## 2020-12-01 DIAGNOSIS — Z0000 Encounter for general adult medical examination without abnormal findings: Secondary | ICD-10-CM

## 2020-12-06 DIAGNOSIS — C61 Malignant neoplasm of prostate: Secondary | ICD-10-CM | POA: Diagnosis not present

## 2020-12-09 ENCOUNTER — Other Ambulatory Visit: Payer: Self-pay | Admitting: Internal Medicine

## 2020-12-09 MED FILL — TADALAFIL 5 MG TABS: 5 | 6 days supply | Qty: 30 | Fill #2

## 2020-12-12 DIAGNOSIS — M4312 Spondylolisthesis, cervical region: Secondary | ICD-10-CM | POA: Diagnosis not present

## 2020-12-12 DIAGNOSIS — M5412 Radiculopathy, cervical region: Secondary | ICD-10-CM | POA: Diagnosis not present

## 2020-12-12 DIAGNOSIS — M47812 Spondylosis without myelopathy or radiculopathy, cervical region: Secondary | ICD-10-CM | POA: Diagnosis not present

## 2020-12-12 DIAGNOSIS — C61 Malignant neoplasm of prostate: Secondary | ICD-10-CM | POA: Diagnosis not present

## 2020-12-12 DIAGNOSIS — M542 Cervicalgia: Secondary | ICD-10-CM | POA: Diagnosis not present

## 2020-12-29 ENCOUNTER — Other Ambulatory Visit (HOSPITAL_BASED_OUTPATIENT_CLINIC_OR_DEPARTMENT_OTHER): Payer: Self-pay | Admitting: Neurosurgery

## 2020-12-29 MED FILL — METHOCARBAMOL 500 MG TABLET: 500 | 7 days supply | Qty: 30 | Fill #0

## 2020-12-31 ENCOUNTER — Other Ambulatory Visit (HOSPITAL_BASED_OUTPATIENT_CLINIC_OR_DEPARTMENT_OTHER): Payer: Self-pay

## 2020-12-31 MED ORDER — METHYLPREDNISOLONE 4 MG PO TBPK
ORAL_TABLET | ORAL | 0 refills | Status: DC
  Filled 2020-12-31: qty 21, 6d supply, fill #0

## 2021-01-01 ENCOUNTER — Encounter (HOSPITAL_BASED_OUTPATIENT_CLINIC_OR_DEPARTMENT_OTHER): Payer: Self-pay | Admitting: *Deleted

## 2021-01-01 ENCOUNTER — Emergency Department (HOSPITAL_BASED_OUTPATIENT_CLINIC_OR_DEPARTMENT_OTHER)
Admission: EM | Admit: 2021-01-01 | Discharge: 2021-01-01 | Disposition: A | Discharge status: Home / Self Care | Payer: 59 | Attending: Emergency Medicine | Admitting: Emergency Medicine

## 2021-01-01 ENCOUNTER — Other Ambulatory Visit: Payer: Self-pay

## 2021-01-01 ENCOUNTER — Emergency Department (HOSPITAL_BASED_OUTPATIENT_CLINIC_OR_DEPARTMENT_OTHER): Payer: 59

## 2021-01-01 DIAGNOSIS — R5381 Other malaise: Secondary | ICD-10-CM | POA: Diagnosis not present

## 2021-01-01 DIAGNOSIS — Z8546 Personal history of malignant neoplasm of prostate: Secondary | ICD-10-CM | POA: Diagnosis not present

## 2021-01-01 DIAGNOSIS — Z87891 Personal history of nicotine dependence: Secondary | ICD-10-CM | POA: Diagnosis not present

## 2021-01-01 DIAGNOSIS — Z85828 Personal history of other malignant neoplasm of skin: Secondary | ICD-10-CM | POA: Diagnosis not present

## 2021-01-01 DIAGNOSIS — M25512 Pain in left shoulder: Secondary | ICD-10-CM | POA: Diagnosis not present

## 2021-01-01 DIAGNOSIS — E039 Hypothyroidism, unspecified: Secondary | ICD-10-CM | POA: Insufficient documentation

## 2021-01-01 DIAGNOSIS — K8020 Calculus of gallbladder without cholecystitis without obstruction: Secondary | ICD-10-CM | POA: Diagnosis not present

## 2021-01-01 DIAGNOSIS — Z955 Presence of coronary angioplasty implant and graft: Secondary | ICD-10-CM | POA: Insufficient documentation

## 2021-01-01 DIAGNOSIS — I10 Essential (primary) hypertension: Secondary | ICD-10-CM | POA: Diagnosis not present

## 2021-01-01 DIAGNOSIS — Z8673 Personal history of transient ischemic attack (TIA), and cerebral infarction without residual deficits: Secondary | ICD-10-CM | POA: Diagnosis not present

## 2021-01-01 DIAGNOSIS — I2510 Atherosclerotic heart disease of native coronary artery without angina pectoris: Secondary | ICD-10-CM | POA: Diagnosis not present

## 2021-01-01 DIAGNOSIS — M25511 Pain in right shoulder: Secondary | ICD-10-CM | POA: Diagnosis not present

## 2021-01-01 DIAGNOSIS — M436 Torticollis: Secondary | ICD-10-CM | POA: Insufficient documentation

## 2021-01-01 DIAGNOSIS — R140 Abdominal distension (gaseous): Secondary | ICD-10-CM | POA: Diagnosis not present

## 2021-01-01 DIAGNOSIS — J449 Chronic obstructive pulmonary disease, unspecified: Secondary | ICD-10-CM | POA: Insufficient documentation

## 2021-01-01 DIAGNOSIS — M542 Cervicalgia: Secondary | ICD-10-CM | POA: Diagnosis not present

## 2021-01-01 MED ORDER — DIAZEPAM 5 MG/ML IJ SOLN
5.0000 mg | Freq: Once | INTRAMUSCULAR | Status: AC
  Administered 2021-01-01: 5 mg via INTRAVENOUS
  Filled 2021-01-01: qty 2

## 2021-01-01 MED ORDER — DIAZEPAM 5 MG PO TABS
5.0000 mg | ORAL_TABLET | Freq: Once | ORAL | Status: AC
  Administered 2021-01-01: 5 mg via ORAL
  Filled 2021-01-01: qty 1

## 2021-01-01 MED ORDER — DIAZEPAM 5 MG PO TABS: 5.0000 mg | ORAL_TABLET | Freq: Four times a day (QID) | ORAL | 0 refills | Status: DC | PRN

## 2021-01-01 MED ORDER — LIDOCAINE 5 % EX OINT: 1.0000 "application " | TOPICAL_OINTMENT | CUTANEOUS | 0 refills | Status: DC | PRN

## 2021-01-01 NOTE — ED Provider Notes (Signed)
Emergency Department Provider Note   I have reviewed the triage vital signs and the nursing notes.   HISTORY  Chief Complaint Neck Pain   HPI [name] is a 70 y.o. male with PMH of prostate cancer and chronic neck pain follower by Dr. Vertell Limber presents to the ED with spasm like pain in the left lateral neck. Denies significant midline neck pain. Pain is worsen with moving or turning the head. No known bony mets to the spine with prostate cancer history. No fever or chills. No weakness/numbness in the arms. No lower back pain. Notes some discomfort in the mid back as well. Pain is severe. Notes he was prescribed Robaxin which is not helping.    Past Medical History:  Diagnosis Date  . Anxiety   . Arthritis    "NECK, HANDS, KNEES" (06/14/2016)  . CAD (coronary artery disease)   . Carotid artery stenosis   . Complication of anesthesia    Reports that he had trouble swallowing post anesthesia- CABG  . COPD (chronic obstructive pulmonary disease) ([address]) 12/11/2011   r/s mv - EF 72%; exercise capacity 13 METS; no exercised induced ischemic EKG changes  . Depression, major   . Dyspnea    with exertion  . GERD (gastroesophageal reflux disease)   . HTN (hypertension), benign 04/16/2008   echo - EF >55%; no regional wall or valvular abnormalities  . Hyperlipidemia    statin intolerant  . Hypothyroidism   . Migraine    "crippling headaches as a kid; silent migraines now, no pain, couple times/week" (06/14/2016)  . Prostate cancer ([place]) 04/2019   recent dx - Dr Lawerance Bach  . S/P angioplasty with stent 06/14/16 PCI & DES of high grade AV groove LCX and 1st OM  06/15/2016  . Squamous cell cancer of tongue ([place]) ~ 2002   "35 radiation treatments at [facility]  . Subclavian steal syndrome 06/17/2012   carotid doppler - R systolic brachial pressure 175mmHg, L 158mmHg; R subclavian artery - proxmial obsstruction w/ abnormal monophasic waveforms, R ECA known occlusive disease; L ECA  narrowing w/ 70-99% diameter reductiona  . TIA (transient ischemic attack) 2005  . Viral hepatitis 1970s   "non specific"    Patient Active Problem List   Diagnosis Date Noted  . Former smoker 03/24/2020  . SOB (shortness of breath) 03/24/2020  . Lumbar disc herniation 06/17/2019  . Right lumbar radiculopathy 05/16/2019  . Elevated PSA 05/16/2019  . S/P angioplasty with stent 06/14/16 PCI & DES of high grade AV groove LCX and 1st OM  06/15/2016  . CAD (coronary artery disease) 06/14/2016  . Abnormal stress test   . Carotid artery disease ([place]) 05/20/2013  . Hypothyroidism 03/29/2012  . Erectile dysfunction 07/02/2011  . Anxiety 07/02/2011  . Back pain 07/02/2011  . Cervical disc disease 07/02/2011  . Coronary artery disease 04/29/2011  . Squamous cell carcinoma of tongue ([place]) 04/29/2011  . Hypertension 04/29/2011  . Depression 04/29/2011  . GE reflux 04/29/2011  . Internal carotid artery stenosis 04/29/2011  . Ptosis of eyelid, left 04/29/2011  . Hyperlipidemia 04/29/2011  . History of ischemic vertebrobasilar artery thalamic stroke 04/29/2011  . Attention deficit disorder 04/29/2011  . Degenerative joint disease of cervical spine 04/29/2011    Past Surgical History:  Procedure Laterality Date  . BACK SURGERY    . BIOPSY  02/20/2018   Procedure: BIOPSY;  Surgeon: Juanita Craver, MD;  Location: [facility];  Service: Endoscopy;;  . BIOPSY TONGUE  ~  2002   "base of tongue and uvula"  . CARDIAC CATHETERIZATION  04/26/2008   L circumflex 99% stenosed midportion straddling a marginal branch; RCA total priximally w/ grade 2 L to R collaterals; renal arteries widely patent; LAD 30% segmental proximal stenosis  . CARDIAC CATHETERIZATION N/A 05/14/2016   Procedure: Left Heart Cath and Cors/Grafts Angiography;  Surgeon: [name], MD;  Location: [place] [facility];  Service: Cardiovascular;  Laterality: N/A;  . CARDIAC CATHETERIZATION N/A 06/14/2016   Procedure: Coronary Stent  Intervention;  Surgeon: [name], MD;  Location: [place] [facility];  Service: Cardiovascular;  Laterality: N/A;  . [place]   [place]  . COLONOSCOPY WITH PROPOFOL N/A 02/20/2018   Procedure: COLONOSCOPY WITH PROPOFOL;  Surgeon: Juanita Craver, MD;  Location: [facility];  Service: Endoscopy;  Laterality: N/A;  . CORONARY ANGIOPLASTY WITH STENT PLACEMENT    . CORONARY ARTERY BYPASS GRAFT  2009   x5; LIMA to LAd, sequential vein to first diagonal branch, first and second obtuse marginal branches; vein to PDA  . ESOPHAGOGASTRODUODENOSCOPY (EGD) WITH PROPOFOL N/A 02/20/2018   Procedure: ESOPHAGOGASTRODUODENOSCOPY (EGD) WITH PROPOFOL;  Surgeon: Juanita Craver, MD;  [facility];  Service: Endoscopy;  Laterality: N/A;  . INSERTION OF MESH N/A 10/29/2017   Procedure: INSERTION OF MESH;  Surgeon: Donnie Mesa, MD;  Location: [place];  Service: General;  Laterality: N/A;  . LUMBAR [place] SURGERY  06/17/2019   LUMBAR 2 & 3   . LUMBAR LAMINECTOMY/DECOMPRESSION MICRODISCECTOMY N/A 06/17/2019   Procedure: Lumbar two -Lumbar three decompression and disectomy;  Surgeon: Melina Schools, MD;  Location: [place];  Service: Orthopedics;  Laterality: N/A;  3 hrs  . POLYPECTOMY  02/20/2018   Procedure: POLYPECTOMY;  Surgeon: Juanita Craver, MD;  [facility];  Service: Endoscopy;;  . SHOULDER ARTHROSCOPY WITH SUBACROMIAL DECOMPRESSION AND OPEN ROTATOR C Left 09/28/2013   Procedure: LEFT SHOULDER ARTHROSCOPY WITH SUBACROMIAL DECOMPRESSION AND MINI OPEN ROTATOR CUFF REPAIR, OPEN DISTAL CLAVICLE RESECTION AND OPEN BICEP TENDODESIS ;  Surgeon: Augustin Schooling, MD;  Location: [place];  Service: Orthopedics;  Laterality: Left;  . UMBILICAL HERNIA REPAIR N/A 10/29/2017   Procedure: UMBILICAL HERNIA REPAIR;  Surgeon: Donnie Mesa, MD;  Location: [place];  Service: General;  Laterality: N/A;    Allergies Crestor [rosuvastatin calcium], Tramadol, Lisinopril, Atorvastatin, Gabapentin, Tricor  [fenofibrate], and Lipitor [atorvastatin calcium]  Family History  Problem Relation Age of Onset  . Cancer Mother        ovarian  . Stroke Father   . Diabetes Father   . Arthritis Father   . Hypertension Brother     Social History Social History   Tobacco Use  . Smoking status: Former Smoker    Packs/day: 3.00    Years: 30.00    Pack years: 90.00    Types: Cigarettes    Quit date: 01/29/2001    Years since quitting: 19.9  . Smokeless tobacco: Never Used  Vaping Use  . Vaping Use: Never used  Substance Use Topics  . Alcohol use: Yes    Alcohol/week: 35.0 standard drinks    Types: 35 Cans of beer per week    Comment: 4-5 day beers  . Drug use: No    Review of Systems  Constitutional: No fever/chills Eyes: No visual changes. ENT: No sore throat. Cardiovascular: Denies chest pain. Respiratory: Denies shortness of breath. Gastrointestinal: No abdominal pain.  No nausea, no vomiting.  No diarrhea.  No constipation. Genitourinary: Negative  for dysuria. Musculoskeletal: Positive neck pain.  Skin: Negative for rash. Neurological: Negative for headaches, focal weakness or numbness.  10-point ROS otherwise negative.  ____________________________________________   PHYSICAL EXAM:  VITAL SIGNS: ED Triage Vitals  Enc Vitals Group     BP 01/01/21 1019 (!) 178/110     Pulse Rate 01/01/21 1019 89     Resp 01/01/21 1019 18     Temp 01/01/21 1016 98.3 F (36.8 C)     Temp Source 01/01/21 1019 Oral     SpO2 01/01/21 1019 99 %     Weight 01/01/21 1018 145 lb (65.8 kg)     Height 01/01/21 1018 5\' 7"  (1.702 m)   Constitutional: Alert and oriented. Patient awake and alert but appears very uncomfortable with even small neck movements.  Eyes: Conjunctivae are normal.  Head: Atraumatic. Nose: No congestion/rhinnorhea. Mouth/Throat: Mucous membranes are moist.  Neck: No stridor.  No cervical spine tenderness to palpation but some tenderness along the left SCM.   Cardiovascular: Normal rate, regular rhythm. Good peripheral circulation. Grossly normal heart sounds.   Respiratory: Normal respiratory effort.  No retractions. Lungs CTAB. Gastrointestinal: Soft and nontender. No distention.  Musculoskeletal: No lower extremity tenderness nor edema. No gross deformities of extremities. Neurologic:  Normal speech and language. No gross focal neurologic deficits are appreciated. Equal strength and sensation in the bilateral upper and lower extremities.  Skin:  Skin is warm, dry and intact. No rash noted.   ____________________________________________  RADIOLOGY  CT c-spine and thoracic spine reviewed  ____________________________________________   PROCEDURES  Procedure(s) performed:   Procedures  None ____________________________________________   INITIAL IMPRESSION / ASSESSMENT AND PLAN / ED COURSE  Pertinent labs & imaging results that were available during my care of the patient were reviewed by me and considered in my medical decision making (see chart for details).   Patient presents to the ED with neck and back pain which is severe. No neuro deficits to suspect acute spine emergency or cord compression. Patient does have a history of prostate cancer. Plan for CT c-spine and thoracic to evaluate for bony lesions vs pathological fracture given the patient's degree of pain. Will order IV valium with pain seeming to spasm and centered over MSK. BP elevated likely due to pain. No history or PE findings to suspect HTN emergency.   CT imaging reviewed and discussed with radiology. No acute findings. Patient improved with Valium here. Will switch from Robaxin to Valium for pain at home. He does not take ativan regularly. Advised he cannot take both meds together. Reviewed the [place] drug database and sent Rx to the pharmacy. Patient plans to call spine team on Monday.    ____________________________________________  FINAL CLINICAL IMPRESSION(S) / ED  DIAGNOSES  Final diagnoses:  Torticollis     MEDICATIONS GIVEN DURING THIS VISIT:  Medications  diazepam (VALIUM) injection 5 mg (5 mg Intravenous Given 01/01/21 1230)  diazepam (VALIUM) tablet 5 mg (5 mg Oral Given 01/01/21 1443)     NEW OUTPATIENT MEDICATIONS STARTED DURING THIS VISIT:  Discharge Medication List as of 01/01/2021  2:41 PM    START taking these medications   Details  diazepam (VALIUM) 5 MG tablet Take 1 tablet (5 mg total) by mouth every 6 (six) hours as needed for muscle spasms., Starting Sun 01/01/2021, Normal    lidocaine (XYLOCAINE) 5 % ointment Apply 1 application topically as needed., Starting Sun 01/01/2021, Normal        Note:  This document was prepared using  [employer] forces training and education officer and may include unintentional dictation errors.  Nanda Quinton, MD, [facility] Emergency Medicine    Marland Reine, [facility], MD 01/03/21 [phone]

## 2021-01-01 NOTE — ED Notes (Signed)
Goes to [facility]; states he had an MRI with an injection in his neck 2-3 weeks ago.  His pain is acute on chronic & significantly worse today.  He woke up at 6am in severe pain

## 2021-01-01 NOTE — ED Notes (Signed)
Patient transported to CT 

## 2021-01-01 NOTE — Discharge Instructions (Addendum)
You were seen in the emergency department today with severe neck pain.  Your CT scans of the neck and mid back showed bad degenerative changes but no broken bones or cancer lesions.  I am calling in a prescription for Valium to take for muscle relaxation.  You cannot take it along with your Ativan and so you can either take one or the other every 6 hours.  You cannot take this along with the Robaxin either.  Please call your spine surgery team tomorrow to schedule a follow-up appointment.

## 2021-01-01 NOTE — ED Provider Notes (Addendum)
MSE was initiated and I personally evaluated the patient and placed orders (if any) at  10:29 AM on January 01, 2021.  The patient appears stable so that the remainder of the MSE may be completed by another provider.  Patient is a 70 year old male with past medical history significant for prostate cancer and chronic neck pain followed by neurosurgery who presents the ED with complaints of worsening neck pain over the course of the past 12 hours.  He states that last night his neck pain became much worse and then today he could hardly get out of bed due to his pain symptoms.  He states that approximately 50 years ago he had to be hospitalized for severe neck spasms limiting his ability to turn his head, that sound comparable to torticollis.  Denies any fevers.  No new traumas.  He was dissatisfied with the steroid injection received last week and his symptoms have been uncontrolled despite prescribed Robaxin.  No visual changes.   Corena Herter, PA-C 01/01/21 1031    Corena Herter, PA-C 01/01/21 1032    Margette Fast, MD 01/03/21 [phone]

## 2021-01-01 NOTE — ED Triage Notes (Signed)
Pt with left lateral neck pain, right shoulder blade pain that is chronic-worse today. Saw neurosurgeon and had an injection for pain in the last week. Pt reports that he was given robaxin '1 pill every six hours' states that he has taken '3 pills every 6 hours' without relief. Last dose 2 hours PTA.

## 2021-01-02 ENCOUNTER — Telehealth: Payer: Self-pay | Admitting: Internal Medicine

## 2021-01-02 NOTE — Telephone Encounter (Signed)
Called over and spoke with someone at Dr Melven Sartorius office and they let me know they were waiting on Dr Vertell Limber to let them know what to do. They seen where note was in and they will call them just as soon as they have word from Dr Vertell Limber.  Called Collie Siad back to let her know what I was told after Dr Renold Genta had me call and that she should wait on their call and that we were not going to do anything, that Dr Vertell Limber would handle it, since he is the doctor that sees him for this problem.

## 2021-01-02 NOTE — Telephone Encounter (Signed)
[name] [phone]  Collie Siad called to say Eliyas went to the ED yesterday in extreme pain in his neck, he can not walk,sleep, move, it is very tight, hard to eat. They did not give him any thing for pain, told him to follow up with doctor. They called Dr Melven Sartorius office and they can not see him until next Monday.

## 2021-01-02 NOTE — Telephone Encounter (Signed)
My Staff has spoken with Dr. Melven Sartorius office. They are aware of [name]'s predicament and are in the process of handling this. We do not need to be involved as he has been going for injections there recently. MJB,MD

## 2021-01-02 NOTE — Telephone Encounter (Signed)
Please call Dr. Melven Sartorius assistant and ask why patient can't be worked in or given pain med. Has been getting nerve blocks through that office.

## 2021-01-03 ENCOUNTER — Other Ambulatory Visit (HOSPITAL_BASED_OUTPATIENT_CLINIC_OR_DEPARTMENT_OTHER): Payer: Self-pay

## 2021-01-03 MED ORDER — OXYCODONE-ACETAMINOPHEN 5-325 MG PO TABS
ORAL_TABLET | ORAL | 0 refills | Status: DC
  Filled 2021-01-03: qty 40, 5d supply, fill #0

## 2021-01-09 DIAGNOSIS — C61 Malignant neoplasm of prostate: Secondary | ICD-10-CM | POA: Diagnosis not present

## 2021-01-11 ENCOUNTER — Other Ambulatory Visit (HOSPITAL_BASED_OUTPATIENT_CLINIC_OR_DEPARTMENT_OTHER): Payer: Self-pay

## 2021-01-11 MED ORDER — METHOCARBAMOL 500 MG PO TABS
ORAL_TABLET | ORAL | 0 refills | Status: DC
  Filled 2021-01-11: qty 30, 7d supply, fill #0

## 2021-01-17 DIAGNOSIS — C61 Malignant neoplasm of prostate: Secondary | ICD-10-CM | POA: Diagnosis not present

## 2021-01-17 DIAGNOSIS — Z9079 Acquired absence of other genital organ(s): Secondary | ICD-10-CM | POA: Diagnosis not present

## 2021-01-17 DIAGNOSIS — Z510 Encounter for antineoplastic radiation therapy: Secondary | ICD-10-CM | POA: Diagnosis not present

## 2021-01-18 ENCOUNTER — Other Ambulatory Visit: Payer: Self-pay | Admitting: Cardiovascular Disease

## 2021-01-18 ENCOUNTER — Other Ambulatory Visit: Payer: Self-pay

## 2021-01-18 ENCOUNTER — Other Ambulatory Visit (HOSPITAL_BASED_OUTPATIENT_CLINIC_OR_DEPARTMENT_OTHER): Payer: Self-pay

## 2021-01-18 MED ORDER — TADALAFIL 5 MG PO TABS
ORAL_TABLET | ORAL | 2 refills | Status: DC
  Filled 2021-01-18: qty 30, 7d supply, fill #0
  Filled 2021-11-06: qty 30, 7d supply, fill #1

## 2021-01-18 MED ORDER — PRALUENT 150 MG/ML ~~LOC~~ SOAJ
SUBCUTANEOUS | 11 refills | Status: DC
  Filled 2021-01-18: qty 2, 28d supply, fill #0
  Filled 2021-03-06: qty 2, 28d supply, fill #1
  Filled 2021-04-11: qty 2, 28d supply, fill #2
  Filled 2021-05-08: qty 2, 28d supply, fill #3
  Filled 2021-06-09: qty 2, 28d supply, fill #4
  Filled 2021-07-06: qty 2, 28d supply, fill #5
  Filled 2021-08-07: qty 2, 28d supply, fill #6
  Filled 2021-09-08: qty 2, 28d supply, fill #7
  Filled 2021-09-29 – 2021-10-02 (×2): qty 2, 28d supply, fill #8
  Filled 2021-11-06: qty 2, 28d supply, fill #9
  Filled 2021-11-29: qty 2, 28d supply, fill #10
  Filled 2022-01-05: qty 2, 28d supply, fill #11

## 2021-01-18 MED ORDER — METHOCARBAMOL 500 MG PO TABS
ORAL_TABLET | ORAL | 0 refills | Status: DC
  Filled 2021-01-18: qty 30, 7d supply, fill #0

## 2021-01-18 MED FILL — Amlodipine Besylate Tab 10 MG (Base Equivalent): ORAL | 90 days supply | Qty: 90 | Fill #0 | Status: AC

## 2021-01-19 DIAGNOSIS — Z9079 Acquired absence of other genital organ(s): Secondary | ICD-10-CM | POA: Diagnosis not present

## 2021-01-19 DIAGNOSIS — Z510 Encounter for antineoplastic radiation therapy: Secondary | ICD-10-CM | POA: Diagnosis not present

## 2021-01-19 DIAGNOSIS — C61 Malignant neoplasm of prostate: Secondary | ICD-10-CM | POA: Diagnosis not present

## 2021-01-20 DIAGNOSIS — Z9079 Acquired absence of other genital organ(s): Secondary | ICD-10-CM | POA: Diagnosis not present

## 2021-01-20 DIAGNOSIS — C61 Malignant neoplasm of prostate: Secondary | ICD-10-CM | POA: Diagnosis not present

## 2021-01-20 DIAGNOSIS — Z510 Encounter for antineoplastic radiation therapy: Secondary | ICD-10-CM | POA: Diagnosis not present

## 2021-01-23 DIAGNOSIS — C61 Malignant neoplasm of prostate: Secondary | ICD-10-CM | POA: Diagnosis not present

## 2021-01-23 DIAGNOSIS — Z9079 Acquired absence of other genital organ(s): Secondary | ICD-10-CM | POA: Diagnosis not present

## 2021-01-23 DIAGNOSIS — Z510 Encounter for antineoplastic radiation therapy: Secondary | ICD-10-CM | POA: Diagnosis not present

## 2021-01-24 ENCOUNTER — Other Ambulatory Visit (HOSPITAL_BASED_OUTPATIENT_CLINIC_OR_DEPARTMENT_OTHER): Payer: Self-pay

## 2021-01-24 DIAGNOSIS — Z9079 Acquired absence of other genital organ(s): Secondary | ICD-10-CM | POA: Diagnosis not present

## 2021-01-24 DIAGNOSIS — C61 Malignant neoplasm of prostate: Secondary | ICD-10-CM | POA: Diagnosis not present

## 2021-01-24 DIAGNOSIS — Z510 Encounter for antineoplastic radiation therapy: Secondary | ICD-10-CM | POA: Diagnosis not present

## 2021-01-24 MED FILL — Lorazepam Tab 1 MG: ORAL | 30 days supply | Qty: 120 | Fill #0 | Status: AC

## 2021-01-25 ENCOUNTER — Other Ambulatory Visit (HOSPITAL_BASED_OUTPATIENT_CLINIC_OR_DEPARTMENT_OTHER): Payer: Self-pay

## 2021-01-25 DIAGNOSIS — Z9079 Acquired absence of other genital organ(s): Secondary | ICD-10-CM | POA: Diagnosis not present

## 2021-01-25 DIAGNOSIS — C61 Malignant neoplasm of prostate: Secondary | ICD-10-CM | POA: Diagnosis not present

## 2021-01-25 DIAGNOSIS — Z510 Encounter for antineoplastic radiation therapy: Secondary | ICD-10-CM | POA: Diagnosis not present

## 2021-01-25 DIAGNOSIS — M47812 Spondylosis without myelopathy or radiculopathy, cervical region: Secondary | ICD-10-CM | POA: Diagnosis not present

## 2021-01-25 DIAGNOSIS — M4312 Spondylolisthesis, cervical region: Secondary | ICD-10-CM | POA: Diagnosis not present

## 2021-01-25 MED ORDER — TOPIRAMATE 25 MG PO TABS
ORAL_TABLET | ORAL | 1 refills | Status: DC
  Filled 2021-01-25: qty 30, 30d supply, fill #0

## 2021-01-26 DIAGNOSIS — C61 Malignant neoplasm of prostate: Secondary | ICD-10-CM | POA: Diagnosis not present

## 2021-01-26 DIAGNOSIS — Z9079 Acquired absence of other genital organ(s): Secondary | ICD-10-CM | POA: Diagnosis not present

## 2021-01-26 DIAGNOSIS — Z510 Encounter for antineoplastic radiation therapy: Secondary | ICD-10-CM | POA: Diagnosis not present

## 2021-01-27 DIAGNOSIS — Z510 Encounter for antineoplastic radiation therapy: Secondary | ICD-10-CM | POA: Diagnosis not present

## 2021-01-27 DIAGNOSIS — Z9079 Acquired absence of other genital organ(s): Secondary | ICD-10-CM | POA: Diagnosis not present

## 2021-01-27 DIAGNOSIS — C61 Malignant neoplasm of prostate: Secondary | ICD-10-CM | POA: Diagnosis not present

## 2021-01-30 ENCOUNTER — Other Ambulatory Visit (HOSPITAL_BASED_OUTPATIENT_CLINIC_OR_DEPARTMENT_OTHER): Payer: Self-pay

## 2021-01-30 DIAGNOSIS — F32A Depression, unspecified: Secondary | ICD-10-CM | POA: Diagnosis not present

## 2021-01-30 DIAGNOSIS — R351 Nocturia: Secondary | ICD-10-CM | POA: Diagnosis not present

## 2021-01-30 DIAGNOSIS — C61 Malignant neoplasm of prostate: Secondary | ICD-10-CM | POA: Diagnosis not present

## 2021-01-30 DIAGNOSIS — Z9079 Acquired absence of other genital organ(s): Secondary | ICD-10-CM | POA: Diagnosis not present

## 2021-01-30 DIAGNOSIS — Z510 Encounter for antineoplastic radiation therapy: Secondary | ICD-10-CM | POA: Diagnosis not present

## 2021-01-30 DIAGNOSIS — N393 Stress incontinence (female) (male): Secondary | ICD-10-CM | POA: Diagnosis not present

## 2021-01-30 DIAGNOSIS — G8929 Other chronic pain: Secondary | ICD-10-CM | POA: Diagnosis not present

## 2021-01-30 DIAGNOSIS — F909 Attention-deficit hyperactivity disorder, unspecified type: Secondary | ICD-10-CM | POA: Diagnosis not present

## 2021-01-30 DIAGNOSIS — M549 Dorsalgia, unspecified: Secondary | ICD-10-CM | POA: Diagnosis not present

## 2021-01-30 DIAGNOSIS — M542 Cervicalgia: Secondary | ICD-10-CM | POA: Diagnosis not present

## 2021-01-31 DIAGNOSIS — M542 Cervicalgia: Secondary | ICD-10-CM | POA: Diagnosis not present

## 2021-01-31 DIAGNOSIS — C61 Malignant neoplasm of prostate: Secondary | ICD-10-CM | POA: Diagnosis not present

## 2021-01-31 DIAGNOSIS — N393 Stress incontinence (female) (male): Secondary | ICD-10-CM | POA: Diagnosis not present

## 2021-01-31 DIAGNOSIS — R351 Nocturia: Secondary | ICD-10-CM | POA: Diagnosis not present

## 2021-01-31 DIAGNOSIS — F32A Depression, unspecified: Secondary | ICD-10-CM | POA: Diagnosis not present

## 2021-01-31 DIAGNOSIS — G8929 Other chronic pain: Secondary | ICD-10-CM | POA: Diagnosis not present

## 2021-01-31 DIAGNOSIS — M549 Dorsalgia, unspecified: Secondary | ICD-10-CM | POA: Diagnosis not present

## 2021-01-31 DIAGNOSIS — Z9079 Acquired absence of other genital organ(s): Secondary | ICD-10-CM | POA: Diagnosis not present

## 2021-01-31 DIAGNOSIS — F909 Attention-deficit hyperactivity disorder, unspecified type: Secondary | ICD-10-CM | POA: Diagnosis not present

## 2021-01-31 DIAGNOSIS — Z510 Encounter for antineoplastic radiation therapy: Secondary | ICD-10-CM | POA: Diagnosis not present

## 2021-02-01 DIAGNOSIS — G8929 Other chronic pain: Secondary | ICD-10-CM | POA: Diagnosis not present

## 2021-02-01 DIAGNOSIS — M549 Dorsalgia, unspecified: Secondary | ICD-10-CM | POA: Diagnosis not present

## 2021-02-01 DIAGNOSIS — M542 Cervicalgia: Secondary | ICD-10-CM | POA: Diagnosis not present

## 2021-02-01 DIAGNOSIS — F909 Attention-deficit hyperactivity disorder, unspecified type: Secondary | ICD-10-CM | POA: Diagnosis not present

## 2021-02-01 DIAGNOSIS — Z9079 Acquired absence of other genital organ(s): Secondary | ICD-10-CM | POA: Diagnosis not present

## 2021-02-01 DIAGNOSIS — F32A Depression, unspecified: Secondary | ICD-10-CM | POA: Diagnosis not present

## 2021-02-01 DIAGNOSIS — N393 Stress incontinence (female) (male): Secondary | ICD-10-CM | POA: Diagnosis not present

## 2021-02-01 DIAGNOSIS — Z510 Encounter for antineoplastic radiation therapy: Secondary | ICD-10-CM | POA: Diagnosis not present

## 2021-02-01 DIAGNOSIS — C61 Malignant neoplasm of prostate: Secondary | ICD-10-CM | POA: Diagnosis not present

## 2021-02-01 DIAGNOSIS — R351 Nocturia: Secondary | ICD-10-CM | POA: Diagnosis not present

## 2021-02-02 DIAGNOSIS — G8929 Other chronic pain: Secondary | ICD-10-CM | POA: Diagnosis not present

## 2021-02-02 DIAGNOSIS — M549 Dorsalgia, unspecified: Secondary | ICD-10-CM | POA: Diagnosis not present

## 2021-02-02 DIAGNOSIS — Z510 Encounter for antineoplastic radiation therapy: Secondary | ICD-10-CM | POA: Diagnosis not present

## 2021-02-02 DIAGNOSIS — F909 Attention-deficit hyperactivity disorder, unspecified type: Secondary | ICD-10-CM | POA: Diagnosis not present

## 2021-02-02 DIAGNOSIS — M542 Cervicalgia: Secondary | ICD-10-CM | POA: Diagnosis not present

## 2021-02-02 DIAGNOSIS — Z9079 Acquired absence of other genital organ(s): Secondary | ICD-10-CM | POA: Diagnosis not present

## 2021-02-02 DIAGNOSIS — R351 Nocturia: Secondary | ICD-10-CM | POA: Diagnosis not present

## 2021-02-02 DIAGNOSIS — F32A Depression, unspecified: Secondary | ICD-10-CM | POA: Diagnosis not present

## 2021-02-02 DIAGNOSIS — N393 Stress incontinence (female) (male): Secondary | ICD-10-CM | POA: Diagnosis not present

## 2021-02-02 DIAGNOSIS — C61 Malignant neoplasm of prostate: Secondary | ICD-10-CM | POA: Diagnosis not present

## 2021-02-03 DIAGNOSIS — C61 Malignant neoplasm of prostate: Secondary | ICD-10-CM | POA: Diagnosis not present

## 2021-02-03 DIAGNOSIS — M549 Dorsalgia, unspecified: Secondary | ICD-10-CM | POA: Diagnosis not present

## 2021-02-03 DIAGNOSIS — Z510 Encounter for antineoplastic radiation therapy: Secondary | ICD-10-CM | POA: Diagnosis not present

## 2021-02-03 DIAGNOSIS — N393 Stress incontinence (female) (male): Secondary | ICD-10-CM | POA: Diagnosis not present

## 2021-02-03 DIAGNOSIS — Z9079 Acquired absence of other genital organ(s): Secondary | ICD-10-CM | POA: Diagnosis not present

## 2021-02-03 DIAGNOSIS — M542 Cervicalgia: Secondary | ICD-10-CM | POA: Diagnosis not present

## 2021-02-03 DIAGNOSIS — F32A Depression, unspecified: Secondary | ICD-10-CM | POA: Diagnosis not present

## 2021-02-03 DIAGNOSIS — R351 Nocturia: Secondary | ICD-10-CM | POA: Diagnosis not present

## 2021-02-03 DIAGNOSIS — F909 Attention-deficit hyperactivity disorder, unspecified type: Secondary | ICD-10-CM | POA: Diagnosis not present

## 2021-02-03 DIAGNOSIS — G8929 Other chronic pain: Secondary | ICD-10-CM | POA: Diagnosis not present

## 2021-02-06 DIAGNOSIS — M542 Cervicalgia: Secondary | ICD-10-CM | POA: Diagnosis not present

## 2021-02-06 DIAGNOSIS — G8929 Other chronic pain: Secondary | ICD-10-CM | POA: Diagnosis not present

## 2021-02-06 DIAGNOSIS — Z510 Encounter for antineoplastic radiation therapy: Secondary | ICD-10-CM | POA: Diagnosis not present

## 2021-02-06 DIAGNOSIS — F32A Depression, unspecified: Secondary | ICD-10-CM | POA: Diagnosis not present

## 2021-02-06 DIAGNOSIS — C61 Malignant neoplasm of prostate: Secondary | ICD-10-CM | POA: Diagnosis not present

## 2021-02-06 DIAGNOSIS — M549 Dorsalgia, unspecified: Secondary | ICD-10-CM | POA: Diagnosis not present

## 2021-02-06 DIAGNOSIS — R351 Nocturia: Secondary | ICD-10-CM | POA: Diagnosis not present

## 2021-02-06 DIAGNOSIS — Z9079 Acquired absence of other genital organ(s): Secondary | ICD-10-CM | POA: Diagnosis not present

## 2021-02-06 DIAGNOSIS — F909 Attention-deficit hyperactivity disorder, unspecified type: Secondary | ICD-10-CM | POA: Diagnosis not present

## 2021-02-06 DIAGNOSIS — N393 Stress incontinence (female) (male): Secondary | ICD-10-CM | POA: Diagnosis not present

## 2021-02-07 ENCOUNTER — Other Ambulatory Visit (HOSPITAL_BASED_OUTPATIENT_CLINIC_OR_DEPARTMENT_OTHER): Payer: Self-pay

## 2021-02-07 DIAGNOSIS — F32A Depression, unspecified: Secondary | ICD-10-CM | POA: Diagnosis not present

## 2021-02-07 DIAGNOSIS — Z9079 Acquired absence of other genital organ(s): Secondary | ICD-10-CM | POA: Diagnosis not present

## 2021-02-07 DIAGNOSIS — R351 Nocturia: Secondary | ICD-10-CM | POA: Diagnosis not present

## 2021-02-07 DIAGNOSIS — Z510 Encounter for antineoplastic radiation therapy: Secondary | ICD-10-CM | POA: Diagnosis not present

## 2021-02-07 DIAGNOSIS — F909 Attention-deficit hyperactivity disorder, unspecified type: Secondary | ICD-10-CM | POA: Diagnosis not present

## 2021-02-07 DIAGNOSIS — M549 Dorsalgia, unspecified: Secondary | ICD-10-CM | POA: Diagnosis not present

## 2021-02-07 DIAGNOSIS — N393 Stress incontinence (female) (male): Secondary | ICD-10-CM | POA: Diagnosis not present

## 2021-02-07 DIAGNOSIS — M542 Cervicalgia: Secondary | ICD-10-CM | POA: Diagnosis not present

## 2021-02-07 DIAGNOSIS — C61 Malignant neoplasm of prostate: Secondary | ICD-10-CM | POA: Diagnosis not present

## 2021-02-07 DIAGNOSIS — G8929 Other chronic pain: Secondary | ICD-10-CM | POA: Diagnosis not present

## 2021-02-07 MED FILL — Pantoprazole Sodium EC Tab 40 MG (Base Equiv): ORAL | 90 days supply | Qty: 90 | Fill #0 | Status: AC

## 2021-02-07 MED FILL — Levothyroxine Sodium Tab 75 MCG: ORAL | 90 days supply | Qty: 90 | Fill #0 | Status: AC

## 2021-02-07 MED FILL — Hydrochlorothiazide Cap 12.5 MG: ORAL | 90 days supply | Qty: 90 | Fill #0 | Status: AC

## 2021-02-08 DIAGNOSIS — M542 Cervicalgia: Secondary | ICD-10-CM | POA: Diagnosis not present

## 2021-02-08 DIAGNOSIS — M549 Dorsalgia, unspecified: Secondary | ICD-10-CM | POA: Diagnosis not present

## 2021-02-08 DIAGNOSIS — G8929 Other chronic pain: Secondary | ICD-10-CM | POA: Diagnosis not present

## 2021-02-08 DIAGNOSIS — N393 Stress incontinence (female) (male): Secondary | ICD-10-CM | POA: Diagnosis not present

## 2021-02-08 DIAGNOSIS — R351 Nocturia: Secondary | ICD-10-CM | POA: Diagnosis not present

## 2021-02-08 DIAGNOSIS — F32A Depression, unspecified: Secondary | ICD-10-CM | POA: Diagnosis not present

## 2021-02-08 DIAGNOSIS — Z510 Encounter for antineoplastic radiation therapy: Secondary | ICD-10-CM | POA: Diagnosis not present

## 2021-02-08 DIAGNOSIS — F909 Attention-deficit hyperactivity disorder, unspecified type: Secondary | ICD-10-CM | POA: Diagnosis not present

## 2021-02-08 DIAGNOSIS — Z9079 Acquired absence of other genital organ(s): Secondary | ICD-10-CM | POA: Diagnosis not present

## 2021-02-08 DIAGNOSIS — C61 Malignant neoplasm of prostate: Secondary | ICD-10-CM | POA: Diagnosis not present

## 2021-02-09 ENCOUNTER — Other Ambulatory Visit (HOSPITAL_BASED_OUTPATIENT_CLINIC_OR_DEPARTMENT_OTHER): Payer: Self-pay

## 2021-02-09 DIAGNOSIS — M62838 Other muscle spasm: Secondary | ICD-10-CM | POA: Diagnosis not present

## 2021-02-09 DIAGNOSIS — M549 Dorsalgia, unspecified: Secondary | ICD-10-CM | POA: Diagnosis not present

## 2021-02-09 DIAGNOSIS — G8929 Other chronic pain: Secondary | ICD-10-CM | POA: Diagnosis not present

## 2021-02-09 DIAGNOSIS — Z789 Other specified health status: Secondary | ICD-10-CM | POA: Diagnosis not present

## 2021-02-09 DIAGNOSIS — C61 Malignant neoplasm of prostate: Secondary | ICD-10-CM | POA: Diagnosis not present

## 2021-02-09 DIAGNOSIS — N393 Stress incontinence (female) (male): Secondary | ICD-10-CM | POA: Diagnosis not present

## 2021-02-09 DIAGNOSIS — Z510 Encounter for antineoplastic radiation therapy: Secondary | ICD-10-CM | POA: Diagnosis not present

## 2021-02-09 DIAGNOSIS — M542 Cervicalgia: Secondary | ICD-10-CM | POA: Diagnosis not present

## 2021-02-09 DIAGNOSIS — F909 Attention-deficit hyperactivity disorder, unspecified type: Secondary | ICD-10-CM | POA: Diagnosis not present

## 2021-02-09 DIAGNOSIS — R351 Nocturia: Secondary | ICD-10-CM | POA: Diagnosis not present

## 2021-02-09 DIAGNOSIS — Z9079 Acquired absence of other genital organ(s): Secondary | ICD-10-CM | POA: Diagnosis not present

## 2021-02-09 DIAGNOSIS — R29898 Other symptoms and signs involving the musculoskeletal system: Secondary | ICD-10-CM | POA: Diagnosis not present

## 2021-02-09 DIAGNOSIS — M6289 Other specified disorders of muscle: Secondary | ICD-10-CM | POA: Diagnosis not present

## 2021-02-09 DIAGNOSIS — Z7409 Other reduced mobility: Secondary | ICD-10-CM | POA: Diagnosis not present

## 2021-02-09 DIAGNOSIS — F32A Depression, unspecified: Secondary | ICD-10-CM | POA: Diagnosis not present

## 2021-02-09 MED ORDER — DESVENLAFAXINE SUCCINATE ER 50 MG PO TB24
ORAL_TABLET | ORAL | 0 refills | Status: DC
  Filled 2021-02-09: qty 90, 90d supply, fill #0

## 2021-02-09 MED ORDER — AMPHETAMINE-DEXTROAMPHETAMINE 20 MG PO TABS
ORAL_TABLET | ORAL | 0 refills | Status: DC
  Filled 2021-02-09: qty 270, 90d supply, fill #0

## 2021-02-10 ENCOUNTER — Other Ambulatory Visit (HOSPITAL_BASED_OUTPATIENT_CLINIC_OR_DEPARTMENT_OTHER): Payer: Self-pay

## 2021-02-10 DIAGNOSIS — N393 Stress incontinence (female) (male): Secondary | ICD-10-CM | POA: Diagnosis not present

## 2021-02-10 DIAGNOSIS — F32A Depression, unspecified: Secondary | ICD-10-CM | POA: Diagnosis not present

## 2021-02-10 DIAGNOSIS — M542 Cervicalgia: Secondary | ICD-10-CM | POA: Diagnosis not present

## 2021-02-10 DIAGNOSIS — R351 Nocturia: Secondary | ICD-10-CM | POA: Diagnosis not present

## 2021-02-10 DIAGNOSIS — M549 Dorsalgia, unspecified: Secondary | ICD-10-CM | POA: Diagnosis not present

## 2021-02-10 DIAGNOSIS — Z510 Encounter for antineoplastic radiation therapy: Secondary | ICD-10-CM | POA: Diagnosis not present

## 2021-02-10 DIAGNOSIS — F909 Attention-deficit hyperactivity disorder, unspecified type: Secondary | ICD-10-CM | POA: Diagnosis not present

## 2021-02-10 DIAGNOSIS — Z9079 Acquired absence of other genital organ(s): Secondary | ICD-10-CM | POA: Diagnosis not present

## 2021-02-10 DIAGNOSIS — G8929 Other chronic pain: Secondary | ICD-10-CM | POA: Diagnosis not present

## 2021-02-10 DIAGNOSIS — C61 Malignant neoplasm of prostate: Secondary | ICD-10-CM | POA: Diagnosis not present

## 2021-02-13 DIAGNOSIS — M549 Dorsalgia, unspecified: Secondary | ICD-10-CM | POA: Diagnosis not present

## 2021-02-13 DIAGNOSIS — Z510 Encounter for antineoplastic radiation therapy: Secondary | ICD-10-CM | POA: Diagnosis not present

## 2021-02-13 DIAGNOSIS — M542 Cervicalgia: Secondary | ICD-10-CM | POA: Diagnosis not present

## 2021-02-13 DIAGNOSIS — C61 Malignant neoplasm of prostate: Secondary | ICD-10-CM | POA: Diagnosis not present

## 2021-02-13 DIAGNOSIS — Z9079 Acquired absence of other genital organ(s): Secondary | ICD-10-CM | POA: Diagnosis not present

## 2021-02-13 DIAGNOSIS — F909 Attention-deficit hyperactivity disorder, unspecified type: Secondary | ICD-10-CM | POA: Diagnosis not present

## 2021-02-13 DIAGNOSIS — R351 Nocturia: Secondary | ICD-10-CM | POA: Diagnosis not present

## 2021-02-13 DIAGNOSIS — G8929 Other chronic pain: Secondary | ICD-10-CM | POA: Diagnosis not present

## 2021-02-13 DIAGNOSIS — F32A Depression, unspecified: Secondary | ICD-10-CM | POA: Diagnosis not present

## 2021-02-13 DIAGNOSIS — N393 Stress incontinence (female) (male): Secondary | ICD-10-CM | POA: Diagnosis not present

## 2021-02-14 DIAGNOSIS — F909 Attention-deficit hyperactivity disorder, unspecified type: Secondary | ICD-10-CM | POA: Diagnosis not present

## 2021-02-14 DIAGNOSIS — G8929 Other chronic pain: Secondary | ICD-10-CM | POA: Diagnosis not present

## 2021-02-14 DIAGNOSIS — M549 Dorsalgia, unspecified: Secondary | ICD-10-CM | POA: Diagnosis not present

## 2021-02-14 DIAGNOSIS — C61 Malignant neoplasm of prostate: Secondary | ICD-10-CM | POA: Diagnosis not present

## 2021-02-14 DIAGNOSIS — Z9079 Acquired absence of other genital organ(s): Secondary | ICD-10-CM | POA: Diagnosis not present

## 2021-02-14 DIAGNOSIS — Z510 Encounter for antineoplastic radiation therapy: Secondary | ICD-10-CM | POA: Diagnosis not present

## 2021-02-14 DIAGNOSIS — M542 Cervicalgia: Secondary | ICD-10-CM | POA: Diagnosis not present

## 2021-02-14 DIAGNOSIS — F32A Depression, unspecified: Secondary | ICD-10-CM | POA: Diagnosis not present

## 2021-02-14 DIAGNOSIS — R351 Nocturia: Secondary | ICD-10-CM | POA: Diagnosis not present

## 2021-02-14 DIAGNOSIS — N393 Stress incontinence (female) (male): Secondary | ICD-10-CM | POA: Diagnosis not present

## 2021-02-15 DIAGNOSIS — F32A Depression, unspecified: Secondary | ICD-10-CM | POA: Diagnosis not present

## 2021-02-15 DIAGNOSIS — M542 Cervicalgia: Secondary | ICD-10-CM | POA: Diagnosis not present

## 2021-02-15 DIAGNOSIS — G8929 Other chronic pain: Secondary | ICD-10-CM | POA: Diagnosis not present

## 2021-02-15 DIAGNOSIS — F909 Attention-deficit hyperactivity disorder, unspecified type: Secondary | ICD-10-CM | POA: Diagnosis not present

## 2021-02-15 DIAGNOSIS — Z9079 Acquired absence of other genital organ(s): Secondary | ICD-10-CM | POA: Diagnosis not present

## 2021-02-15 DIAGNOSIS — N393 Stress incontinence (female) (male): Secondary | ICD-10-CM | POA: Diagnosis not present

## 2021-02-15 DIAGNOSIS — R351 Nocturia: Secondary | ICD-10-CM | POA: Diagnosis not present

## 2021-02-15 DIAGNOSIS — M549 Dorsalgia, unspecified: Secondary | ICD-10-CM | POA: Diagnosis not present

## 2021-02-15 DIAGNOSIS — Z510 Encounter for antineoplastic radiation therapy: Secondary | ICD-10-CM | POA: Diagnosis not present

## 2021-02-15 DIAGNOSIS — C61 Malignant neoplasm of prostate: Secondary | ICD-10-CM | POA: Diagnosis not present

## 2021-02-16 ENCOUNTER — Other Ambulatory Visit (HOSPITAL_BASED_OUTPATIENT_CLINIC_OR_DEPARTMENT_OTHER): Payer: Self-pay

## 2021-02-16 DIAGNOSIS — G8929 Other chronic pain: Secondary | ICD-10-CM | POA: Diagnosis not present

## 2021-02-16 DIAGNOSIS — C61 Malignant neoplasm of prostate: Secondary | ICD-10-CM | POA: Diagnosis not present

## 2021-02-16 DIAGNOSIS — M542 Cervicalgia: Secondary | ICD-10-CM | POA: Diagnosis not present

## 2021-02-16 DIAGNOSIS — R351 Nocturia: Secondary | ICD-10-CM | POA: Diagnosis not present

## 2021-02-16 DIAGNOSIS — Z510 Encounter for antineoplastic radiation therapy: Secondary | ICD-10-CM | POA: Diagnosis not present

## 2021-02-16 DIAGNOSIS — Z9079 Acquired absence of other genital organ(s): Secondary | ICD-10-CM | POA: Diagnosis not present

## 2021-02-16 DIAGNOSIS — N393 Stress incontinence (female) (male): Secondary | ICD-10-CM | POA: Diagnosis not present

## 2021-02-16 DIAGNOSIS — F909 Attention-deficit hyperactivity disorder, unspecified type: Secondary | ICD-10-CM | POA: Diagnosis not present

## 2021-02-16 DIAGNOSIS — M549 Dorsalgia, unspecified: Secondary | ICD-10-CM | POA: Diagnosis not present

## 2021-02-16 DIAGNOSIS — F32A Depression, unspecified: Secondary | ICD-10-CM | POA: Diagnosis not present

## 2021-02-17 ENCOUNTER — Other Ambulatory Visit (HOSPITAL_BASED_OUTPATIENT_CLINIC_OR_DEPARTMENT_OTHER): Payer: Self-pay

## 2021-02-17 DIAGNOSIS — N393 Stress incontinence (female) (male): Secondary | ICD-10-CM | POA: Diagnosis not present

## 2021-02-17 DIAGNOSIS — Z510 Encounter for antineoplastic radiation therapy: Secondary | ICD-10-CM | POA: Diagnosis not present

## 2021-02-17 DIAGNOSIS — C61 Malignant neoplasm of prostate: Secondary | ICD-10-CM | POA: Diagnosis not present

## 2021-02-17 DIAGNOSIS — G8929 Other chronic pain: Secondary | ICD-10-CM | POA: Diagnosis not present

## 2021-02-17 DIAGNOSIS — Z9079 Acquired absence of other genital organ(s): Secondary | ICD-10-CM | POA: Diagnosis not present

## 2021-02-17 DIAGNOSIS — F909 Attention-deficit hyperactivity disorder, unspecified type: Secondary | ICD-10-CM | POA: Diagnosis not present

## 2021-02-17 DIAGNOSIS — M542 Cervicalgia: Secondary | ICD-10-CM | POA: Diagnosis not present

## 2021-02-17 DIAGNOSIS — R351 Nocturia: Secondary | ICD-10-CM | POA: Diagnosis not present

## 2021-02-17 DIAGNOSIS — M549 Dorsalgia, unspecified: Secondary | ICD-10-CM | POA: Diagnosis not present

## 2021-02-17 DIAGNOSIS — F32A Depression, unspecified: Secondary | ICD-10-CM | POA: Diagnosis not present

## 2021-02-17 MED ORDER — OXYCODONE-ACETAMINOPHEN 5-325 MG PO TABS
ORAL_TABLET | ORAL | 0 refills | Status: DC
  Filled 2021-02-17: qty 40, 5d supply, fill #0

## 2021-02-20 ENCOUNTER — Other Ambulatory Visit (HOSPITAL_BASED_OUTPATIENT_CLINIC_OR_DEPARTMENT_OTHER): Payer: Self-pay

## 2021-02-20 DIAGNOSIS — M542 Cervicalgia: Secondary | ICD-10-CM | POA: Diagnosis not present

## 2021-02-20 DIAGNOSIS — F32A Depression, unspecified: Secondary | ICD-10-CM | POA: Diagnosis not present

## 2021-02-20 DIAGNOSIS — M549 Dorsalgia, unspecified: Secondary | ICD-10-CM | POA: Diagnosis not present

## 2021-02-20 DIAGNOSIS — N393 Stress incontinence (female) (male): Secondary | ICD-10-CM | POA: Diagnosis not present

## 2021-02-20 DIAGNOSIS — F909 Attention-deficit hyperactivity disorder, unspecified type: Secondary | ICD-10-CM | POA: Diagnosis not present

## 2021-02-20 DIAGNOSIS — R351 Nocturia: Secondary | ICD-10-CM | POA: Diagnosis not present

## 2021-02-20 DIAGNOSIS — Z510 Encounter for antineoplastic radiation therapy: Secondary | ICD-10-CM | POA: Diagnosis not present

## 2021-02-20 DIAGNOSIS — C61 Malignant neoplasm of prostate: Secondary | ICD-10-CM | POA: Diagnosis not present

## 2021-02-20 DIAGNOSIS — G8929 Other chronic pain: Secondary | ICD-10-CM | POA: Diagnosis not present

## 2021-02-20 DIAGNOSIS — Z9079 Acquired absence of other genital organ(s): Secondary | ICD-10-CM | POA: Diagnosis not present

## 2021-02-21 DIAGNOSIS — Z510 Encounter for antineoplastic radiation therapy: Secondary | ICD-10-CM | POA: Diagnosis not present

## 2021-02-21 DIAGNOSIS — R351 Nocturia: Secondary | ICD-10-CM | POA: Diagnosis not present

## 2021-02-21 DIAGNOSIS — C61 Malignant neoplasm of prostate: Secondary | ICD-10-CM | POA: Diagnosis not present

## 2021-02-21 DIAGNOSIS — G8929 Other chronic pain: Secondary | ICD-10-CM | POA: Diagnosis not present

## 2021-02-21 DIAGNOSIS — Z9079 Acquired absence of other genital organ(s): Secondary | ICD-10-CM | POA: Diagnosis not present

## 2021-02-21 DIAGNOSIS — M549 Dorsalgia, unspecified: Secondary | ICD-10-CM | POA: Diagnosis not present

## 2021-02-21 DIAGNOSIS — F32A Depression, unspecified: Secondary | ICD-10-CM | POA: Diagnosis not present

## 2021-02-21 DIAGNOSIS — N393 Stress incontinence (female) (male): Secondary | ICD-10-CM | POA: Diagnosis not present

## 2021-02-21 DIAGNOSIS — F909 Attention-deficit hyperactivity disorder, unspecified type: Secondary | ICD-10-CM | POA: Diagnosis not present

## 2021-02-21 DIAGNOSIS — M542 Cervicalgia: Secondary | ICD-10-CM | POA: Diagnosis not present

## 2021-02-22 DIAGNOSIS — G8929 Other chronic pain: Secondary | ICD-10-CM | POA: Diagnosis not present

## 2021-02-22 DIAGNOSIS — C61 Malignant neoplasm of prostate: Secondary | ICD-10-CM | POA: Diagnosis not present

## 2021-02-22 DIAGNOSIS — N393 Stress incontinence (female) (male): Secondary | ICD-10-CM | POA: Diagnosis not present

## 2021-02-22 DIAGNOSIS — M542 Cervicalgia: Secondary | ICD-10-CM | POA: Diagnosis not present

## 2021-02-22 DIAGNOSIS — Z9079 Acquired absence of other genital organ(s): Secondary | ICD-10-CM | POA: Diagnosis not present

## 2021-02-22 DIAGNOSIS — F909 Attention-deficit hyperactivity disorder, unspecified type: Secondary | ICD-10-CM | POA: Diagnosis not present

## 2021-02-22 DIAGNOSIS — M549 Dorsalgia, unspecified: Secondary | ICD-10-CM | POA: Diagnosis not present

## 2021-02-22 DIAGNOSIS — R351 Nocturia: Secondary | ICD-10-CM | POA: Diagnosis not present

## 2021-02-22 DIAGNOSIS — F32A Depression, unspecified: Secondary | ICD-10-CM | POA: Diagnosis not present

## 2021-02-22 DIAGNOSIS — Z510 Encounter for antineoplastic radiation therapy: Secondary | ICD-10-CM | POA: Diagnosis not present

## 2021-02-23 DIAGNOSIS — C61 Malignant neoplasm of prostate: Secondary | ICD-10-CM | POA: Diagnosis not present

## 2021-02-23 DIAGNOSIS — R350 Frequency of micturition: Secondary | ICD-10-CM | POA: Diagnosis not present

## 2021-02-23 DIAGNOSIS — G8929 Other chronic pain: Secondary | ICD-10-CM | POA: Diagnosis not present

## 2021-02-23 DIAGNOSIS — M542 Cervicalgia: Secondary | ICD-10-CM | POA: Diagnosis not present

## 2021-02-23 DIAGNOSIS — Z9079 Acquired absence of other genital organ(s): Secondary | ICD-10-CM | POA: Diagnosis not present

## 2021-02-23 DIAGNOSIS — F909 Attention-deficit hyperactivity disorder, unspecified type: Secondary | ICD-10-CM | POA: Diagnosis not present

## 2021-02-23 DIAGNOSIS — R351 Nocturia: Secondary | ICD-10-CM | POA: Diagnosis not present

## 2021-02-23 DIAGNOSIS — M549 Dorsalgia, unspecified: Secondary | ICD-10-CM | POA: Diagnosis not present

## 2021-02-23 DIAGNOSIS — Z510 Encounter for antineoplastic radiation therapy: Secondary | ICD-10-CM | POA: Diagnosis not present

## 2021-02-23 DIAGNOSIS — F32A Depression, unspecified: Secondary | ICD-10-CM | POA: Diagnosis not present

## 2021-02-23 DIAGNOSIS — N393 Stress incontinence (female) (male): Secondary | ICD-10-CM | POA: Diagnosis not present

## 2021-02-24 DIAGNOSIS — C61 Malignant neoplasm of prostate: Secondary | ICD-10-CM | POA: Diagnosis not present

## 2021-02-24 DIAGNOSIS — Z9079 Acquired absence of other genital organ(s): Secondary | ICD-10-CM | POA: Diagnosis not present

## 2021-02-24 DIAGNOSIS — R351 Nocturia: Secondary | ICD-10-CM | POA: Diagnosis not present

## 2021-02-24 DIAGNOSIS — N393 Stress incontinence (female) (male): Secondary | ICD-10-CM | POA: Diagnosis not present

## 2021-02-24 DIAGNOSIS — M542 Cervicalgia: Secondary | ICD-10-CM | POA: Diagnosis not present

## 2021-02-24 DIAGNOSIS — M549 Dorsalgia, unspecified: Secondary | ICD-10-CM | POA: Diagnosis not present

## 2021-02-24 DIAGNOSIS — F32A Depression, unspecified: Secondary | ICD-10-CM | POA: Diagnosis not present

## 2021-02-24 DIAGNOSIS — G8929 Other chronic pain: Secondary | ICD-10-CM | POA: Diagnosis not present

## 2021-02-24 DIAGNOSIS — Z510 Encounter for antineoplastic radiation therapy: Secondary | ICD-10-CM | POA: Diagnosis not present

## 2021-02-24 DIAGNOSIS — F909 Attention-deficit hyperactivity disorder, unspecified type: Secondary | ICD-10-CM | POA: Diagnosis not present

## 2021-02-28 DIAGNOSIS — N393 Stress incontinence (female) (male): Secondary | ICD-10-CM | POA: Diagnosis not present

## 2021-02-28 DIAGNOSIS — M549 Dorsalgia, unspecified: Secondary | ICD-10-CM | POA: Diagnosis not present

## 2021-02-28 DIAGNOSIS — R351 Nocturia: Secondary | ICD-10-CM | POA: Diagnosis not present

## 2021-02-28 DIAGNOSIS — M542 Cervicalgia: Secondary | ICD-10-CM | POA: Diagnosis not present

## 2021-02-28 DIAGNOSIS — Z9079 Acquired absence of other genital organ(s): Secondary | ICD-10-CM | POA: Diagnosis not present

## 2021-02-28 DIAGNOSIS — F32A Depression, unspecified: Secondary | ICD-10-CM | POA: Diagnosis not present

## 2021-02-28 DIAGNOSIS — G8929 Other chronic pain: Secondary | ICD-10-CM | POA: Diagnosis not present

## 2021-02-28 DIAGNOSIS — C61 Malignant neoplasm of prostate: Secondary | ICD-10-CM | POA: Diagnosis not present

## 2021-02-28 DIAGNOSIS — Z510 Encounter for antineoplastic radiation therapy: Secondary | ICD-10-CM | POA: Diagnosis not present

## 2021-02-28 DIAGNOSIS — F909 Attention-deficit hyperactivity disorder, unspecified type: Secondary | ICD-10-CM | POA: Diagnosis not present

## 2021-03-01 DIAGNOSIS — M549 Dorsalgia, unspecified: Secondary | ICD-10-CM | POA: Diagnosis not present

## 2021-03-01 DIAGNOSIS — Z9079 Acquired absence of other genital organ(s): Secondary | ICD-10-CM | POA: Diagnosis not present

## 2021-03-01 DIAGNOSIS — R351 Nocturia: Secondary | ICD-10-CM | POA: Diagnosis not present

## 2021-03-01 DIAGNOSIS — N393 Stress incontinence (female) (male): Secondary | ICD-10-CM | POA: Diagnosis not present

## 2021-03-01 DIAGNOSIS — F909 Attention-deficit hyperactivity disorder, unspecified type: Secondary | ICD-10-CM | POA: Diagnosis not present

## 2021-03-01 DIAGNOSIS — M542 Cervicalgia: Secondary | ICD-10-CM | POA: Diagnosis not present

## 2021-03-01 DIAGNOSIS — F32A Depression, unspecified: Secondary | ICD-10-CM | POA: Diagnosis not present

## 2021-03-01 DIAGNOSIS — C61 Malignant neoplasm of prostate: Secondary | ICD-10-CM | POA: Diagnosis not present

## 2021-03-01 DIAGNOSIS — G8929 Other chronic pain: Secondary | ICD-10-CM | POA: Diagnosis not present

## 2021-03-01 DIAGNOSIS — Z510 Encounter for antineoplastic radiation therapy: Secondary | ICD-10-CM | POA: Diagnosis not present

## 2021-03-02 DIAGNOSIS — G8929 Other chronic pain: Secondary | ICD-10-CM | POA: Diagnosis not present

## 2021-03-02 DIAGNOSIS — C61 Malignant neoplasm of prostate: Secondary | ICD-10-CM | POA: Diagnosis not present

## 2021-03-02 DIAGNOSIS — M549 Dorsalgia, unspecified: Secondary | ICD-10-CM | POA: Diagnosis not present

## 2021-03-02 DIAGNOSIS — Z510 Encounter for antineoplastic radiation therapy: Secondary | ICD-10-CM | POA: Diagnosis not present

## 2021-03-02 DIAGNOSIS — F32A Depression, unspecified: Secondary | ICD-10-CM | POA: Diagnosis not present

## 2021-03-02 DIAGNOSIS — Z9079 Acquired absence of other genital organ(s): Secondary | ICD-10-CM | POA: Diagnosis not present

## 2021-03-02 DIAGNOSIS — N393 Stress incontinence (female) (male): Secondary | ICD-10-CM | POA: Diagnosis not present

## 2021-03-02 DIAGNOSIS — F909 Attention-deficit hyperactivity disorder, unspecified type: Secondary | ICD-10-CM | POA: Diagnosis not present

## 2021-03-02 DIAGNOSIS — M542 Cervicalgia: Secondary | ICD-10-CM | POA: Diagnosis not present

## 2021-03-02 DIAGNOSIS — R351 Nocturia: Secondary | ICD-10-CM | POA: Diagnosis not present

## 2021-03-03 DIAGNOSIS — N393 Stress incontinence (female) (male): Secondary | ICD-10-CM | POA: Diagnosis not present

## 2021-03-03 DIAGNOSIS — Z510 Encounter for antineoplastic radiation therapy: Secondary | ICD-10-CM | POA: Diagnosis not present

## 2021-03-03 DIAGNOSIS — M549 Dorsalgia, unspecified: Secondary | ICD-10-CM | POA: Diagnosis not present

## 2021-03-03 DIAGNOSIS — F909 Attention-deficit hyperactivity disorder, unspecified type: Secondary | ICD-10-CM | POA: Diagnosis not present

## 2021-03-03 DIAGNOSIS — F32A Depression, unspecified: Secondary | ICD-10-CM | POA: Diagnosis not present

## 2021-03-03 DIAGNOSIS — M542 Cervicalgia: Secondary | ICD-10-CM | POA: Diagnosis not present

## 2021-03-03 DIAGNOSIS — C61 Malignant neoplasm of prostate: Secondary | ICD-10-CM | POA: Diagnosis not present

## 2021-03-03 DIAGNOSIS — Z9079 Acquired absence of other genital organ(s): Secondary | ICD-10-CM | POA: Diagnosis not present

## 2021-03-03 DIAGNOSIS — R351 Nocturia: Secondary | ICD-10-CM | POA: Diagnosis not present

## 2021-03-03 DIAGNOSIS — G8929 Other chronic pain: Secondary | ICD-10-CM | POA: Diagnosis not present

## 2021-03-06 ENCOUNTER — Other Ambulatory Visit (HOSPITAL_BASED_OUTPATIENT_CLINIC_OR_DEPARTMENT_OTHER): Payer: Self-pay

## 2021-03-06 DIAGNOSIS — Z510 Encounter for antineoplastic radiation therapy: Secondary | ICD-10-CM | POA: Diagnosis not present

## 2021-03-06 DIAGNOSIS — R351 Nocturia: Secondary | ICD-10-CM | POA: Diagnosis not present

## 2021-03-06 DIAGNOSIS — Z9079 Acquired absence of other genital organ(s): Secondary | ICD-10-CM | POA: Diagnosis not present

## 2021-03-06 DIAGNOSIS — F32A Depression, unspecified: Secondary | ICD-10-CM | POA: Diagnosis not present

## 2021-03-06 DIAGNOSIS — G8929 Other chronic pain: Secondary | ICD-10-CM | POA: Diagnosis not present

## 2021-03-06 DIAGNOSIS — C61 Malignant neoplasm of prostate: Secondary | ICD-10-CM | POA: Diagnosis not present

## 2021-03-06 DIAGNOSIS — M549 Dorsalgia, unspecified: Secondary | ICD-10-CM | POA: Diagnosis not present

## 2021-03-06 DIAGNOSIS — N393 Stress incontinence (female) (male): Secondary | ICD-10-CM | POA: Diagnosis not present

## 2021-03-06 DIAGNOSIS — F909 Attention-deficit hyperactivity disorder, unspecified type: Secondary | ICD-10-CM | POA: Diagnosis not present

## 2021-03-06 DIAGNOSIS — M542 Cervicalgia: Secondary | ICD-10-CM | POA: Diagnosis not present

## 2021-03-07 DIAGNOSIS — G8929 Other chronic pain: Secondary | ICD-10-CM | POA: Diagnosis not present

## 2021-03-07 DIAGNOSIS — R351 Nocturia: Secondary | ICD-10-CM | POA: Diagnosis not present

## 2021-03-07 DIAGNOSIS — M542 Cervicalgia: Secondary | ICD-10-CM | POA: Diagnosis not present

## 2021-03-07 DIAGNOSIS — F909 Attention-deficit hyperactivity disorder, unspecified type: Secondary | ICD-10-CM | POA: Diagnosis not present

## 2021-03-07 DIAGNOSIS — C61 Malignant neoplasm of prostate: Secondary | ICD-10-CM | POA: Diagnosis not present

## 2021-03-07 DIAGNOSIS — Z510 Encounter for antineoplastic radiation therapy: Secondary | ICD-10-CM | POA: Diagnosis not present

## 2021-03-07 DIAGNOSIS — M549 Dorsalgia, unspecified: Secondary | ICD-10-CM | POA: Diagnosis not present

## 2021-03-07 DIAGNOSIS — F32A Depression, unspecified: Secondary | ICD-10-CM | POA: Diagnosis not present

## 2021-03-07 DIAGNOSIS — N393 Stress incontinence (female) (male): Secondary | ICD-10-CM | POA: Diagnosis not present

## 2021-03-07 DIAGNOSIS — Z9079 Acquired absence of other genital organ(s): Secondary | ICD-10-CM | POA: Diagnosis not present

## 2021-03-08 DIAGNOSIS — F909 Attention-deficit hyperactivity disorder, unspecified type: Secondary | ICD-10-CM | POA: Diagnosis not present

## 2021-03-08 DIAGNOSIS — Z9079 Acquired absence of other genital organ(s): Secondary | ICD-10-CM | POA: Diagnosis not present

## 2021-03-08 DIAGNOSIS — G8929 Other chronic pain: Secondary | ICD-10-CM | POA: Diagnosis not present

## 2021-03-08 DIAGNOSIS — C61 Malignant neoplasm of prostate: Secondary | ICD-10-CM | POA: Diagnosis not present

## 2021-03-08 DIAGNOSIS — F32A Depression, unspecified: Secondary | ICD-10-CM | POA: Diagnosis not present

## 2021-03-08 DIAGNOSIS — Z510 Encounter for antineoplastic radiation therapy: Secondary | ICD-10-CM | POA: Diagnosis not present

## 2021-03-08 DIAGNOSIS — M549 Dorsalgia, unspecified: Secondary | ICD-10-CM | POA: Diagnosis not present

## 2021-03-08 DIAGNOSIS — M542 Cervicalgia: Secondary | ICD-10-CM | POA: Diagnosis not present

## 2021-03-08 DIAGNOSIS — N393 Stress incontinence (female) (male): Secondary | ICD-10-CM | POA: Diagnosis not present

## 2021-03-08 DIAGNOSIS — R351 Nocturia: Secondary | ICD-10-CM | POA: Diagnosis not present

## 2021-03-09 DIAGNOSIS — G8929 Other chronic pain: Secondary | ICD-10-CM | POA: Diagnosis not present

## 2021-03-09 DIAGNOSIS — C61 Malignant neoplasm of prostate: Secondary | ICD-10-CM | POA: Diagnosis not present

## 2021-03-09 DIAGNOSIS — M542 Cervicalgia: Secondary | ICD-10-CM | POA: Diagnosis not present

## 2021-03-09 DIAGNOSIS — F909 Attention-deficit hyperactivity disorder, unspecified type: Secondary | ICD-10-CM | POA: Diagnosis not present

## 2021-03-09 DIAGNOSIS — M549 Dorsalgia, unspecified: Secondary | ICD-10-CM | POA: Diagnosis not present

## 2021-03-09 DIAGNOSIS — N393 Stress incontinence (female) (male): Secondary | ICD-10-CM | POA: Diagnosis not present

## 2021-03-09 DIAGNOSIS — F32A Depression, unspecified: Secondary | ICD-10-CM | POA: Diagnosis not present

## 2021-03-09 DIAGNOSIS — Z9079 Acquired absence of other genital organ(s): Secondary | ICD-10-CM | POA: Diagnosis not present

## 2021-03-09 DIAGNOSIS — R351 Nocturia: Secondary | ICD-10-CM | POA: Diagnosis not present

## 2021-03-09 DIAGNOSIS — Z510 Encounter for antineoplastic radiation therapy: Secondary | ICD-10-CM | POA: Diagnosis not present

## 2021-03-10 DIAGNOSIS — G8929 Other chronic pain: Secondary | ICD-10-CM | POA: Diagnosis not present

## 2021-03-10 DIAGNOSIS — M542 Cervicalgia: Secondary | ICD-10-CM | POA: Diagnosis not present

## 2021-03-10 DIAGNOSIS — N393 Stress incontinence (female) (male): Secondary | ICD-10-CM | POA: Diagnosis not present

## 2021-03-10 DIAGNOSIS — M549 Dorsalgia, unspecified: Secondary | ICD-10-CM | POA: Diagnosis not present

## 2021-03-10 DIAGNOSIS — F32A Depression, unspecified: Secondary | ICD-10-CM | POA: Diagnosis not present

## 2021-03-10 DIAGNOSIS — F909 Attention-deficit hyperactivity disorder, unspecified type: Secondary | ICD-10-CM | POA: Diagnosis not present

## 2021-03-10 DIAGNOSIS — C61 Malignant neoplasm of prostate: Secondary | ICD-10-CM | POA: Diagnosis not present

## 2021-03-10 DIAGNOSIS — Z510 Encounter for antineoplastic radiation therapy: Secondary | ICD-10-CM | POA: Diagnosis not present

## 2021-03-10 DIAGNOSIS — Z9079 Acquired absence of other genital organ(s): Secondary | ICD-10-CM | POA: Diagnosis not present

## 2021-03-10 DIAGNOSIS — R351 Nocturia: Secondary | ICD-10-CM | POA: Diagnosis not present

## 2021-03-13 DIAGNOSIS — F909 Attention-deficit hyperactivity disorder, unspecified type: Secondary | ICD-10-CM | POA: Diagnosis not present

## 2021-03-13 DIAGNOSIS — R351 Nocturia: Secondary | ICD-10-CM | POA: Diagnosis not present

## 2021-03-13 DIAGNOSIS — F32A Depression, unspecified: Secondary | ICD-10-CM | POA: Diagnosis not present

## 2021-03-13 DIAGNOSIS — Z9079 Acquired absence of other genital organ(s): Secondary | ICD-10-CM | POA: Diagnosis not present

## 2021-03-13 DIAGNOSIS — N393 Stress incontinence (female) (male): Secondary | ICD-10-CM | POA: Diagnosis not present

## 2021-03-13 DIAGNOSIS — G8929 Other chronic pain: Secondary | ICD-10-CM | POA: Diagnosis not present

## 2021-03-13 DIAGNOSIS — Z510 Encounter for antineoplastic radiation therapy: Secondary | ICD-10-CM | POA: Diagnosis not present

## 2021-03-13 DIAGNOSIS — M542 Cervicalgia: Secondary | ICD-10-CM | POA: Diagnosis not present

## 2021-03-13 DIAGNOSIS — C61 Malignant neoplasm of prostate: Secondary | ICD-10-CM | POA: Diagnosis not present

## 2021-03-13 DIAGNOSIS — M549 Dorsalgia, unspecified: Secondary | ICD-10-CM | POA: Diagnosis not present

## 2021-03-16 ENCOUNTER — Other Ambulatory Visit: Payer: Self-pay

## 2021-03-17 ENCOUNTER — Other Ambulatory Visit (HOSPITAL_BASED_OUTPATIENT_CLINIC_OR_DEPARTMENT_OTHER): Payer: Self-pay

## 2021-03-17 MED ORDER — LORAZEPAM 1 MG PO TABS
ORAL_TABLET | ORAL | 1 refills | Status: DC
  Filled 2021-03-17: qty 120, 30d supply, fill #0
  Filled 2021-05-17: qty 120, 30d supply, fill #1

## 2021-03-20 ENCOUNTER — Other Ambulatory Visit (HOSPITAL_BASED_OUTPATIENT_CLINIC_OR_DEPARTMENT_OTHER): Payer: Self-pay

## 2021-03-30 ENCOUNTER — Other Ambulatory Visit: Payer: 59 | Admitting: Internal Medicine

## 2021-03-30 ENCOUNTER — Other Ambulatory Visit: Payer: Self-pay

## 2021-03-30 DIAGNOSIS — D0007 Carcinoma in situ of tongue: Secondary | ICD-10-CM | POA: Diagnosis not present

## 2021-03-30 DIAGNOSIS — E7849 Other hyperlipidemia: Secondary | ICD-10-CM

## 2021-03-30 DIAGNOSIS — M5090 Cervical disc disorder, unspecified, unspecified cervical region: Secondary | ICD-10-CM

## 2021-03-30 DIAGNOSIS — E039 Hypothyroidism, unspecified: Secondary | ICD-10-CM

## 2021-03-30 DIAGNOSIS — I6521 Occlusion and stenosis of right carotid artery: Secondary | ICD-10-CM

## 2021-03-30 DIAGNOSIS — Z0000 Encounter for general adult medical examination without abnormal findings: Secondary | ICD-10-CM

## 2021-03-30 DIAGNOSIS — R972 Elevated prostate specific antigen [PSA]: Secondary | ICD-10-CM

## 2021-03-30 DIAGNOSIS — Z789 Other specified health status: Secondary | ICD-10-CM

## 2021-03-30 DIAGNOSIS — Z8659 Personal history of other mental and behavioral disorders: Secondary | ICD-10-CM

## 2021-03-30 DIAGNOSIS — C61 Malignant neoplasm of prostate: Secondary | ICD-10-CM

## 2021-03-30 DIAGNOSIS — R1319 Other dysphagia: Secondary | ICD-10-CM

## 2021-03-31 LAB — COMPLETE METABOLIC PANEL WITHOUT GFR
AG Ratio: 2.2 (calc) (ref 1.0–2.5)
ALT: 21 U/L (ref 9–46)
AST: 20 U/L (ref 10–35)
Albumin: 3.9 g/dL (ref 3.6–5.1)
Alkaline phosphatase (APISO): 56 U/L (ref 35–144)
BUN: 18 mg/dL (ref 7–25)
CO2: 31 mmol/L (ref 20–32)
Calcium: 8.9 mg/dL (ref 8.6–10.3)
Chloride: 104 mmol/L (ref 98–110)
Creat: 1.09 mg/dL (ref 0.70–1.25)
GFR, Est African American: 80 mL/min/{1.73_m2}
GFR, Est Non African American: 69 mL/min/{1.73_m2}
Globulin: 1.8 g/dL — ABNORMAL LOW (ref 1.9–3.7)
Glucose, Bld: 91 mg/dL (ref 65–99)
Potassium: 3.6 mmol/L (ref 3.5–5.3)
Sodium: 143 mmol/L (ref 135–146)
Total Bilirubin: 0.6 mg/dL (ref 0.2–1.2)
Total Protein: 5.7 g/dL — ABNORMAL LOW (ref 6.1–8.1)

## 2021-03-31 LAB — LIPID PANEL
Cholesterol: 138 mg/dL (ref <200)
HDL: 70 mg/dL (ref >=40)
LDL Cholesterol (Calc): 52 mg/dL (calc)
Non-HDL Cholesterol (Calc): 68 mg/dL (calc) (ref <130)
Total CHOL/HDL Ratio: 2 (calc) (ref <5.0)
Triglycerides: 81 mg/dL (ref <150)

## 2021-03-31 LAB — CBC WITH DIFFERENTIAL/PLATELET
Absolute Monocytes: 368 {cells}/uL (ref 200–950)
Basophils Absolute: 48 {cells}/uL (ref 0–200)
Basophils Relative: 1.5 %
Eosinophils Absolute: 269 {cells}/uL (ref 15–500)
Eosinophils Relative: 8.4 %
HCT: 38 % — ABNORMAL LOW (ref 38.5–50.0)
Hemoglobin: 12.9 g/dL — ABNORMAL LOW (ref 13.2–17.1)
Lymphs Abs: 355 {cells}/uL — ABNORMAL LOW (ref 850–3900)
MCH: 30.9 pg (ref 27.0–33.0)
MCHC: 33.9 g/dL (ref 32.0–36.0)
MCV: 91.1 fL (ref 80.0–100.0)
MPV: 9.4 fL (ref 7.5–12.5)
Monocytes Relative: 11.5 %
Neutro Abs: 2160 {cells}/uL (ref 1500–7800)
Neutrophils Relative %: 67.5 %
Platelets: 174 10*3/uL (ref 140–400)
RBC: 4.17 Million/uL — ABNORMAL LOW (ref 4.20–5.80)
RDW: 15.6 % — ABNORMAL HIGH (ref 11.0–15.0)
Total Lymphocyte: 11.1 %
WBC: 3.2 10*3/uL — ABNORMAL LOW (ref 3.8–10.8)

## 2021-03-31 LAB — PSA: PSA: <0.04 ng/mL

## 2021-04-06 ENCOUNTER — Other Ambulatory Visit: Payer: Self-pay

## 2021-04-06 ENCOUNTER — Ambulatory Visit (INDEPENDENT_AMBULATORY_CARE_PROVIDER_SITE_OTHER): Payer: 59 | Admitting: Internal Medicine

## 2021-04-06 ENCOUNTER — Encounter: Payer: Self-pay | Admitting: Internal Medicine

## 2021-04-06 VITALS — BP 130/70 | HR 66 | Ht 67.0 in | Wt 149.0 lb

## 2021-04-06 DIAGNOSIS — D0007 Carcinoma in situ of tongue: Secondary | ICD-10-CM

## 2021-04-06 DIAGNOSIS — Z8673 Personal history of transient ischemic attack (TIA), and cerebral infarction without residual deficits: Secondary | ICD-10-CM

## 2021-04-06 DIAGNOSIS — F32A Depression, unspecified: Secondary | ICD-10-CM

## 2021-04-06 DIAGNOSIS — I771 Stricture of artery: Secondary | ICD-10-CM

## 2021-04-06 DIAGNOSIS — R5383 Other fatigue: Secondary | ICD-10-CM

## 2021-04-06 DIAGNOSIS — E7849 Other hyperlipidemia: Secondary | ICD-10-CM

## 2021-04-06 DIAGNOSIS — F419 Anxiety disorder, unspecified: Secondary | ICD-10-CM

## 2021-04-06 DIAGNOSIS — I10 Essential (primary) hypertension: Secondary | ICD-10-CM

## 2021-04-06 DIAGNOSIS — Z789 Other specified health status: Secondary | ICD-10-CM | POA: Diagnosis not present

## 2021-04-06 DIAGNOSIS — E039 Hypothyroidism, unspecified: Secondary | ICD-10-CM

## 2021-04-06 DIAGNOSIS — Z7901 Long term (current) use of anticoagulants: Secondary | ICD-10-CM

## 2021-04-06 DIAGNOSIS — Z8709 Personal history of other diseases of the respiratory system: Secondary | ICD-10-CM | POA: Diagnosis not present

## 2021-04-06 DIAGNOSIS — Z0000 Encounter for general adult medical examination without abnormal findings: Secondary | ICD-10-CM | POA: Diagnosis not present

## 2021-04-06 DIAGNOSIS — J449 Chronic obstructive pulmonary disease, unspecified: Secondary | ICD-10-CM

## 2021-04-06 DIAGNOSIS — C61 Malignant neoplasm of prostate: Secondary | ICD-10-CM

## 2021-04-06 DIAGNOSIS — I6521 Occlusion and stenosis of right carotid artery: Secondary | ICD-10-CM

## 2021-04-06 DIAGNOSIS — Z8659 Personal history of other mental and behavioral disorders: Secondary | ICD-10-CM

## 2021-04-06 DIAGNOSIS — K2100 Gastro-esophageal reflux disease with esophagitis, without bleeding: Secondary | ICD-10-CM

## 2021-04-06 DIAGNOSIS — D649 Anemia, unspecified: Secondary | ICD-10-CM

## 2021-04-06 DIAGNOSIS — Z951 Presence of aortocoronary bypass graft: Secondary | ICD-10-CM

## 2021-04-06 DIAGNOSIS — R1319 Other dysphagia: Secondary | ICD-10-CM | POA: Diagnosis not present

## 2021-04-06 DIAGNOSIS — M5090 Cervical disc disorder, unspecified, unspecified cervical region: Secondary | ICD-10-CM

## 2021-04-06 LAB — POCT URINALYSIS DIPSTICK
Appearance: NEGATIVE
Bilirubin, UA: NEGATIVE
Blood, UA: NEGATIVE
Glucose, UA: NEGATIVE
Ketones, UA: NEGATIVE
Leukocytes, UA: NEGATIVE
Nitrite, UA: NEGATIVE
Odor: NEGATIVE
Protein, UA: NEGATIVE
Spec Grav, UA: 1.01 (ref 1.010–1.025)
Urobilinogen, UA: 0.2 E.U./dL
pH, UA: 6.5 (ref 5.0–8.0)

## 2021-04-06 NOTE — Progress Notes (Signed)
Subjective:    Patient ID: [name], male    DOB: October 09, 1950, 70 y.o.   MRN: [id number]  HPI 70 year old Male seen  for health maintenance exam and Medicare wellness visit.   Now followed by Dr. Tharon Aquas for Radiation Oncology treatment for prostate cancer.Had radical assisted robotic prostatectomy August 10, 2020 by Dr. Brendia Sacks. Completed radiation treatments. For a number of years, was followed by Dr. Lawerance Bach and PSA levels were in the 3-4 range but increased to 6.6 in March 2020.   He had to undergo L2-L3 disectomy September 2020 by Dr. Rolena Infante for severe intractable low back pain.  Subsequently  PSA increased from 6.6  in March 2020 to 12.6 in June 2021.  I saw him for health maintenance exam June 21.  He had an appointment with Dr. Lawerance Bach on that day but had canceled it.  At that time he was told to reschedule with Dr. Rosana Hoes due to elevated PSA.  PSA was elevated as high as 13.25 July 2020.Biopsy done October 2021 confirming adenocarcinoma of prostate with Gleason score 7.   He is scheduled for right carotid endarterectomy 80-99% blockage on August 5th by Dr. Stanford Breed. Dr. Quay Burow is his Cardiologist.  He has a history of coronary artery disease.  He has a history of hyperlipidemia but is statin intolerant.  History of blood pressure differential in each arm due to subclavian stenosis.  He has a history of hypertension.  He had colonoscopy and upper endoscopy in May 2019 due to issues with swallowing.  He had 3 small sessile polyps on colonoscopy.  He was having some dysphagia and had history of GE reflux.  He had a normal stomach and a normal duodenum.  There were esophageal mucosal changes but no evidence of cancer or eosinophilic esophagitis.  Biopsies were consistent with esophagitis and polyps were hyperplastic.  He has seen Dr.Olare, pulmonologist in December 2020 for shortness of breath and was treated with an inhaler.  History of  white blood cell count in the 3700  range which has been followed for several years and is asymptomatic.  History of neck pain seen by Dr. Vertell Limber.  Has numbness left face but not his neck with intermittent paresthesias down left arm.  Had MRI in late 2021 of C-spine showing significant facet arthropathy on the left at C2-C3 and C3-C4.  There was a fluid cyst in the left C3-C4 facet joint and mild degenerative changes on the right at C6-C7.  Dr. Lorenza Chick believe this to be the basis for his left-sided occipital neck and jaw pain.  He recommended facet blocks at C2-C3 and C3-C4 levels on the left.  History of depression, hypothyroidism, CABG x5 in 2009, remote history of smoking, history of left Horner syndrome, history of stroke, cervical disc disease, squamous cell carcinoma of the tongue, external carotid artery stenosis on the left, history of migraine headaches, allergic rhinitis, attention deficit disorder and history of back pain.  In December 2017 he had PCI and drug-eluting stent placement of a high-grade circumflex and first obtuse marginal branch stenosis in the setting of an occluded sequential vein graft with a patent continuation of OM1-OM 2.  History of 70% right subclavian artery stenosis.  Percutaneous intervention is being considered but has not been done.  He has a 50 mm upper extremity blood pressure differential due to right subclavian and or innominate stenosis.  He is on chronic anticoagulation with Plavix.    He takes Trintellix for depression  and lorazepam for anxiety.  History of left shoulder arthroscopy with some acromial decompression and open rotator cuff repair by Dr. Veverly Fells in 2014.  Open distal clavicle resection and open biceps tendon diathesis.  He takes Protonix for GE reflux.  He is statin intolerant by Dr. Alvester Chou has treated him with Praluent injections 150 mg every 14 days.  In January 2018 he was seen in the emergency department with palpitations, diaphoresis and lightheadedness.  D-dimer was elevated  at 0.75.  CT of the chest angio with contrast showed no evidence of PE.  He was thought not to have an MI and was discharged home.  CT angios of chest and aorta with and without contrast if showed 70% proximal right subclavian artery stenosis.  History of mild plaque in left subclavian artery.  History of mild plaque in distal right subclavian artery.  He has chronic occlusion of the distal right vertebral artery.  He had discectomy of the C-spine around 2000.  He developed left eye ptosis after cardiac surgery.  Says his face feels numb when he turns his head to the left.  He had a left thalamic stroke in 2005.  History of numbness of the left cheek in the C3 distribution.  Documented facet disease on the left at C2-C3, C4-C5, C5-C6 as well as C6-C7 which is aggravated by turning his head.  History of GE reflux  Says he had viral hepatitis a number of years ago.  His previous Neurologist, Dr. Erling Cruz who is now retired felt patient's carotid disease was due to radiation therapy.  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  Social history: Wife works for [employer] in the [place] with epic.  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  He smoked for some 30 years but quit over 20 years ago.  1 son from previous marriage.  This is his second marriage.  He does drink beer.  Family history: Mother died of ovarian cancer.  Father died apparently from heart failure but also had history of stroke around age 81 and diabetes.  Maternal grandmother, paternal grandmother with history of cancer.  He has 2 brothers.    Review of Systems Dr. Toy Care, his psychiatrist, changed Wellbutrin to Pristiq about 3 weeks ago he says     Objective:   Physical Exam  BP 130/70, pulse 66 regular, pulse oximetry 98% weight 149 pounds, height 5 feet 7 inches BMI 23.34.  Skin:  Warm and dry.  Nodes none.  TMs are clear.  Neck supple.  No thyromegaly.  Chest is clear to auscultation. Cardiac exam: Regular rate and rhythm normal S1 and S2 without murmurs or gallops.  Abdomen is soft nondistended without hepatosplenomegaly, masses, or tenderness.  No lower extremity edema.  Neuro: Intact without gross focal deficits.  Does appear to have decreased sensation left face.  Normal muscle strength.  Moves all 4 extremities.     Assessment & Plan:    Scheduled for right transcarotid artery revascularization by Dr. Stanford Breed on August 5.  Hyperlipidemia treated with Praluent  History of PCI by Dr. Gwenlyn Found  History of coronary artery disease status post CABG  Essential hypertension-stable  History of carcinoma base of tongue  Longstanding history of carotid disease  History of cervical disc disease  Hypothyroidism on thyroid replacement  History of left thalamic stroke 2005  History of  left Horner syndrome  History of right vertebral artery occlusion  Prostate cancer being treated at [facility].  Gleason score at the time of diagnosis was 7  Subclavian artery stenosis causing blood pressure discrepancy in his arms  History of 50% stenosis right proximal internal carotid artery and severe narrowing of the right external carotid artery  Anxiety depression-treated by Dr. Toy Care, psychiatrist  History of left shoulder arthropathy status post surgery  GE reflux treated with PPI  Chronic anticoagulation treated with Plavix  Patient is mildly anemic with hemoglobin 12.9 g.  A year ago hemoglobin was 16 g and in 20 2014.5 g.  Lowest hemoglobin on file 13.3 g in 2019 prior to this reading.  B12 and folate levels were normal.  Serum iron was normal and ferritin was 63.  Recommended B12 over-the-counter supplement as B12 level was low normal with follow-up in late August.  He is to have CBC with differential and B12 level drawn today before visit.   Last colonoscopy was in 2019 showing small internal hemorrhoids.  Study was done by Dr. Juanita Craver with 10-year follow-up recommended.  History of probable obstructive lung disease-saw Dr. Ander Slade, pulmonologist in December 2020 and was diagnosed with this.  Pulmonary functions were recommended but were not done.  Patient quit smoking in 1995.  Albuterol inhaler recommended.

## 2021-04-07 LAB — IRON,TIBC AND FERRITIN PANEL
%SAT: 35 % (calc) (ref 20–48)
Ferritin: 63 ng/mL (ref 24–380)
Iron: 124 ug/dL (ref 50–180)
TIBC: 355 mcg/dL (calc) (ref 250–425)

## 2021-04-07 LAB — RETICULOCYTES
ABS Retic: 114140 cells/uL — ABNORMAL HIGH (ref 25000–90000)
Retic Ct Pct: 2.6 %

## 2021-04-07 LAB — FOLATE: Folate: 17.9 ng/mL

## 2021-04-07 LAB — T4, FREE: Free T4: 1.2 ng/dL (ref 0.8–1.8)

## 2021-04-07 LAB — VITAMIN B12: Vitamin B-12: 334 pg/mL (ref 200–1100)

## 2021-04-07 LAB — TSH: TSH: 0.92 mIU/L (ref 0.40–4.50)

## 2021-04-11 ENCOUNTER — Other Ambulatory Visit: Payer: Self-pay

## 2021-04-11 ENCOUNTER — Encounter (HOSPITAL_COMMUNITY): Payer: Self-pay

## 2021-04-11 ENCOUNTER — Ambulatory Visit (HOSPITAL_COMMUNITY)
Admission: RE | Admit: 2021-04-11 | Discharge: 2021-04-11 | Disposition: A | Discharge status: Home / Self Care | Payer: 59 | Source: Ambulatory Visit | Attending: Cardiovascular Disease | Admitting: Cardiovascular Disease

## 2021-04-11 ENCOUNTER — Other Ambulatory Visit (HOSPITAL_BASED_OUTPATIENT_CLINIC_OR_DEPARTMENT_OTHER): Payer: Self-pay

## 2021-04-11 DIAGNOSIS — I6523 Occlusion and stenosis of bilateral carotid arteries: Secondary | ICD-10-CM

## 2021-04-11 DIAGNOSIS — I6521 Occlusion and stenosis of right carotid artery: Secondary | ICD-10-CM | POA: Insufficient documentation

## 2021-04-11 MED ORDER — VILAZODONE HCL 40 MG PO TABS
ORAL_TABLET | ORAL | 0 refills | Status: DC
  Filled 2021-04-11: qty 30, 30d supply, fill #0

## 2021-04-11 NOTE — Progress Notes (Unsigned)
Patient had annual carotid duplex exam at [facility] office today at 11:00 am.   Right ICA is now 80-99% stenosis with moderate right subclavian steal syndrome. He has left face numbness with positional changes for a couple years. The last few months he has had a hard time articulating words along with drooling.  Patient met with the (DOD) Dr. Eleonore Chiquito and instructed about stroke symptoms and will be referred to [facility]. Patient instructed to go home.   Note also sent to Dr. Gwenlyn Found through [facility] who referred him for the exam.  Salvadore Dom, RVT, RDCS, RDMS [facility]

## 2021-04-17 NOTE — Progress Notes (Deleted)
[facility] [place]  ASSESSMENT / PLAN: [name] is a 70 y.o. male with ***symptomatic bilateral (L 60-79%; R 80-99%) carotid artery stenosis.   The patient's right carotid artery stenosis merits consideration of revascularization to reduce the risk of future stroke because of asymptomatic carotid stenosis >80% in a physiologically fit patient. I quoted the patient risk reduction from intervention of ~6% for asymptomatic stenosis based on data from the ACST/ACAS trials.  He has a history of head and neck cancer, and received radiotherapy. He would be best served by TCAR to limit risk of cranial nerve injury.   The patient should continue best medical therapy for carotid artery stenosis including: Complete cessation from all tobacco products. Blood glucose control with goal A1c < 7%. Blood pressure control with goal blood pressure < 140/90 mmHg. Lipid reduction therapy with goal LDL-C <100 mg/dL (<70 if symptomatic from carotid artery stenosis).  Aspirin 81mg  PO QD.  *** Clopidogrel 75mg  PO QD. Atorvastatin 40-80mg  PO QD (or other "high intensity" statin therapy). *** The addition of ezetimibe or PCSK9 inhibitors may benefit patients with difficult to control hypercholesterolemia.  Plan R TCAR ***  CHIEF COMPLAINT: ***  HISTORY OF PRESENT ILLNESS: [name] is a 70 y.o. male referred to clinic for evaluation of critical right carotid artery stenosis. The patient has *** focal symptoms. He specifically denies *** visual disturbances, unilateral weakness / numbness, facial droop, trouble speaking, trouble swallowing. He does report some left sided facial numbness which is positional. He has a history of squamous cell carcinoma of the *** requiring ***.   VASCULAR SURGICAL HISTORY: ***  VASCULAR RISK FACTORS: {FINDINGS; POSITIVE NEGATIVE:[phone]} history of cerebrovascular disease / stroke / transient ischemic attack. {FINDINGS; POSITIVE  NEGATIVE:[phone]} history of coronary artery disease. *** history of PCI. *** history of CABG.  {FINDINGS; POSITIVE NEGATIVE:[phone]} history of diabetes mellitus. Last A1c ***. {FINDINGS; POSITIVE NEGATIVE:[phone]} history of smoking. *** pack / years. *** actively smoking. {FINDINGS; POSITIVE NEGATIVE:[phone]} history of hypertension. *** drug regimen with *** control. {FINDINGS; POSITIVE NEGATIVE:[phone]} history of chronic kidney disease. Last GFR ***.  {FINDINGS; POSITIVE NEGATIVE:[phone]} history of chronic obstructive pulmonary disease, treated with ***.  Past Medical History:  Diagnosis Date   Anxiety    Arthritis    "NECK, HANDS, KNEES" (06/14/2016)   CAD (coronary artery disease)    Carotid artery stenosis    Complication of anesthesia    Reports that he had trouble swallowing post anesthesia- CABG   COPD (chronic obstructive pulmonary disease) (HCC) 12/11/2011   r/s mv - EF 72%; exercise capacity 13 METS; no exercised induced ischemic EKG changes   Depression, major    Dyspnea    with exertion   GERD (gastroesophageal reflux disease)    HTN (hypertension), benign 04/16/2008   echo - EF >55%; no regional wall or valvular abnormalities   Hyperlipidemia    statin intolerant   Hypothyroidism    Migraine    "crippling headaches as a kid; silent migraines now, no pain, couple times/week" (06/14/2016)   Prostate cancer ([place]) 04/2019   recent dx - Dr Lawerance Bach   S/P angioplasty with stent 06/14/16 PCI & DES of high grade AV groove LCX and 1st OM  06/15/2016   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   "35 radiation treatments at [facility]"   Subclavian steal syndrome 06/17/2012   carotid doppler - R systolic brachial pressure 123mmHg, L 1108mmHg; R subclavian artery - proxmial obsstruction w/ abnormal monophasic  waveforms, R ECA known occlusive disease; L ECA narrowing w/ 70-99% diameter reductiona   TIA (transient ischemic attack) 2005   Viral hepatitis 1970s    "non specific"    Past Surgical History:  Procedure Laterality Date   BACK SURGERY     BIOPSY  02/20/2018   Procedure: BIOPSY;  Surgeon: Juanita Craver, MD;  Location: [facility];  Service: Endoscopy;;   BIOPSY TONGUE  ~ 2002   "base of tongue and uvula"   CARDIAC CATHETERIZATION  04/26/2008   L circumflex 99% stenosed midportion straddling a marginal branch; RCA total priximally w/ grade 2 L to R collaterals; renal arteries widely patent; LAD 30% segmental proximal stenosis   CARDIAC CATHETERIZATION N/A 05/14/2016   Procedure: Left Heart Cath and Cors/Grafts Angiography;  Surgeon: [name], MD;  Location: [facility];  Service: Cardiovascular;  Laterality: N/A;   CARDIAC CATHETERIZATION N/A 06/14/2016   Procedure: Coronary Stent Intervention;  Surgeon: [name], MD;  Location: [facility];  Service: Cardiovascular;  Laterality: N/A;   CERVICAL DISC SURGERY  2000   [place]   COLONOSCOPY WITH PROPOFOL N/A 02/20/2018   Procedure: COLONOSCOPY WITH PROPOFOL;  Surgeon: Juanita Craver, MD;  Location: [facility];  Service: Endoscopy;  Laterality: N/A;   CORONARY ANGIOPLASTY WITH STENT PLACEMENT     CORONARY ARTERY BYPASS GRAFT  2009   x5; LIMA to LAd, sequential vein to first diagonal branch, first and second obtuse marginal branches; vein to PDA   ESOPHAGOGASTRODUODENOSCOPY (EGD) WITH PROPOFOL N/A 02/20/2018   Procedure: ESOPHAGOGASTRODUODENOSCOPY (EGD) WITH PROPOFOL;  Surgeon: Juanita Craver, MD;  Location: [facility];  Service: Endoscopy;  Laterality: N/A;   INSERTION OF MESH N/A 10/29/2017   Procedure: INSERTION OF MESH;  Surgeon: Donnie Mesa, MD;  Location: [facility];  Service: General;  Laterality: N/A;   LUMBAR [place] SURGERY  06/17/2019   LUMBAR 2 & 3    LUMBAR LAMINECTOMY/DECOMPRESSION MICRODISCECTOMY N/A 06/17/2019   Procedure: Lumbar two -Lumbar three decompression and disectomy;  Surgeon: Melina Schools, MD;  Location: [facility];  Service: Orthopedics;   Laterality: N/A;  3 hrs   POLYPECTOMY  02/20/2018   Procedure: POLYPECTOMY;  Surgeon: Juanita Craver, MD;  [facility]: [facility];  Service: Endoscopy;;   SHOULDER ARTHROSCOPY WITH SUBACROMIAL DECOMPRESSION AND OPEN ROTATOR C Left 09/28/2013   Procedure: LEFT SHOULDER ARTHROSCOPY WITH SUBACROMIAL DECOMPRESSION AND MINI OPEN ROTATOR CUFF REPAIR, OPEN DISTAL CLAVICLE RESECTION AND OPEN BICEP TENDODESIS ;  Surgeon: Augustin Schooling, MD;  Location: [facility];  Service: Orthopedics;  Laterality: Left;   UMBILICAL HERNIA REPAIR N/A 10/29/2017   Procedure: UMBILICAL HERNIA REPAIR;  Surgeon: Donnie Mesa, MD;  Location: [facility];  Service: General;  Laterality: N/A;    Family History  Problem Relation Age of Onset   Cancer Mother        ovarian   Stroke Father    Diabetes Father    Arthritis Father    Hypertension Brother     Social History   Socioeconomic History   Marital status: Married    Spouse name: Not on file   Number of children: Not on file   Years of education: Not on file   Highest education level: Not on file  Occupational History   Not on file  Tobacco Use   Smoking status: Former    Packs/day: 3.00    Years: 30.00    Pack years: 90.00    Types: Cigarettes    Quit date: 01/29/2001  Years since quitting: 20.2   Smokeless tobacco: Never  Vaping Use   Vaping Use: Never used  Substance and Sexual Activity   Alcohol use: Yes    Alcohol/week: 35.0 standard drinks    Types: 35 Cans of beer per week    Comment: 4-5 day beers   Drug use: No   Sexual activity: Not on file  Other Topics Concern   Not on file  Social History Narrative   Not on file   Social Determinants of Health   Financial Resource Strain: Not on file  Food Insecurity: Not on file  Transportation Needs: Not on file  Physical Activity: Not on file  Stress: Not on file  Social Connections: Not on file  Intimate Partner Violence: Not on file    Allergies  Allergen Reactions   Crestor [Rosuvastatin  Calcium] Other (See Comments)    Difficulty swallowing   Tramadol Shortness Of Breath   Lisinopril Cough   Atorvastatin    Gabapentin Other (See Comments)    Hallucinations   Other    Tricor [Fenofibrate] Other (See Comments)    UNSPECIFIED REACTION   Lipitor [Atorvastatin Calcium] Other (See Comments)    Muscle spasms    Current Outpatient Medications  Medication Sig Dispense Refill   albuterol (VENTOLIN HFA) 108 (90 Base) MCG/ACT inhaler Inhale 2 puffs into the lungs every 6 (six) hours as needed for wheezing or shortness of breath. 18 g 5   Alirocumab (PRALUENT) 150 MG/ML SOAJ INJECT 150 MG INTO THE SKIN EVERY 14 (FOURTEEN) DAYS. 2 mL 11   amLODipine (NORVASC) 10 MG tablet TAKE 1 TABLET BY MOUTH DAILY 90 tablet 3   amphetamine-dextroamphetamine (ADDERALL) 20 MG tablet Take one tablet by mouth in the morning, one tab at noon and one tablet at 4pm. 270 tablet 0   desvenlafaxine (PRISTIQ) 50 MG 24 hr tablet Take 1 tablet by mouth every morning **Discontinue Buproprion** 90 tablet 0   Halcinonide (HALOG) 0.1 % CREA Apply 1 application topically as directed. 1.5 g 0   hydrochlorothiazide (MICROZIDE) 12.5 MG capsule TAKE 1 CAPSULE BY MOUTH DAILY 90 capsule 3   levothyroxine (SYNTHROID) 75 MCG tablet TAKE 1 TABLET BY MOUTH ONCE DAILY 90 tablet 0   lidocaine (XYLOCAINE) 5 % ointment Apply 1 application topically as needed. 35.44 g 0   LORazepam (ATIVAN) 1 MG tablet TAKE 1 TABLET FOUR TIMES DAILY AS NEEDED.  PER DOCTOR: MAKE APPT NOW! 120 tablet 1   pantoprazole (PROTONIX) 40 MG tablet TAKE 1 TABLET (40 MG TOTAL) BY MOUTH DAILY. 90 tablet 3   tadalafil (CIALIS) 5 MG tablet Take 1 tablet (5 mg total) by mouth daily. Take 3-5 times a week for penile rehabilitation. May take up to 20mg  as needed for intercourse. 30 tablet 2   Tetrahydrozoline HCl (VISINE OP) Place 2-3 drops into both eyes 2 (two) times daily as needed (for dry eyes).     Vilazodone HCl (VIIBRYD) 40 MG TABS Take 1/2 tablet by  mouth daily with a full meal for 7 days then increase to 1 tablet daily 30 tablet 0   No current facility-administered medications for this visit.    REVIEW OF SYSTEMS:  [X]  denotes positive finding, [ ]  denotes negative finding Cardiac  Comments:  Chest pain or chest pressure: ***   Shortness of breath upon exertion:    Short of breath when lying flat:    Irregular heart rhythm:        Vascular    Pain in  calf, thigh, or hip brought on by ambulation:    Pain in feet at night that wakes you up from your sleep:     Blood clot in your veins:    Leg swelling:         Pulmonary    Oxygen at home:    Productive cough:     Wheezing:         Neurologic    Sudden weakness in arms or legs:     Sudden numbness in arms or legs:     Sudden onset of difficulty speaking or slurred speech:    Temporary loss of vision in one eye:     Problems with dizziness:         Gastrointestinal    Blood in stool:     Vomited blood:         Genitourinary    Burning when urinating:     Blood in urine:        Psychiatric    Major depression:         Hematologic    Bleeding problems:    Problems with blood clotting too easily:        Skin    Rashes or ulcers:        Constitutional    Fever or chills:      PHYSICAL EXAM There were no vitals filed for this visit.  Constitutional: *** appearing. *** distress. Appears *** nourished.  Neurologic: CN ***. *** focal findings. *** sensory loss. Psychiatric: *** Mood and affect symmetric and appropriate. Eyes: *** No icterus. No conjunctival pallor. Ears, nose, throat: *** mucous membranes moist. Midline trachea.  Cardiac: *** rate and rhythm.  Respiratory: *** unlabored. Abdominal: *** soft, non-tender, non-distended.  Peripheral vascular: *** Extremity: *** edema. *** cyanosis. *** pallor.  Skin: *** gangrene. *** ulceration.  Lymphatic: *** Stemmer's sign. *** palpable lymphadenopathy.  PERTINENT LABORATORY AND RADIOLOGIC DATA  Most  recent CBC CBC Latest Ref Rng & Units 03/30/2021 03/18/2020 06/10/2019  WBC 3.8 - 10.8 Thousand/uL 3.2(L) 4.3 5.3  Hemoglobin 13.2 - 17.1 g/dL 12.9(L) 16.0 16.3  Hematocrit 38.5 - 50.0 % 38.0(L) 47.2 49.2  Platelets 140 - 400 Thousand/uL 174 192 223     Most recent CMP CMP Latest Ref Rng & Units 03/30/2021 03/18/2020 06/10/2019  Glucose 65 - 99 mg/dL 91 109(H) 171(H)  BUN 7 - 25 mg/dL 18 14 18   Creatinine 0.70 - 1.25 mg/dL 1.09 1.19 1.37(H)  Sodium 135 - 146 mmol/L 143 143 138  Potassium 3.5 - 5.3 mmol/L 3.6 4.1 3.4(L)  Chloride 98 - 110 mmol/L 104 103 102  CO2 20 - 32 mmol/L 31 30 26   Calcium 8.6 - 10.3 mg/dL 8.9 9.5 9.3  Total Protein 6.1 - 8.1 g/dL 5.7(L) 6.7 -  Total Bilirubin 0.2 - 1.2 mg/dL 0.6 0.5 -  Alkaline Phos 40 - 115 U/L - - -  AST 10 - 35 U/L 20 26 -  ALT 9 - 46 U/L 21 22 -    Renal function Estimated Creatinine Clearance: 59.8 mL/min (by C-G formula based on SCr of 1.09 mg/dL).  Hgb A1c MFr Bld (%)  Date Value  01/01/2017 5.5    LDL Cholesterol (Calc)  Date Value Ref Range Status  03/30/2021 52 mg/dL (calc) Final    Comment:    Reference range: <100 . Desirable range <100 mg/dL for primary prevention;   <70 mg/dL for patients with CHD or diabetic patients  with > or = 2  CHD risk factors. Marland Kitchen LDL-C is now calculated using the Martin-Hopkins  calculation, which is a validated novel method providing  better accuracy than the Friedewald equation in the  estimation of LDL-C.  Cresenciano Genre et al. Annamaria Helling. 3143;888(75): 2061-2068  (http://education.[employer].com/faq/FAQ164)      Vascular Imaging: ***  Yevonne Aline. Stanford Breed, MD [facility] Phone Number: [phone] 04/17/2021 8:09 PM

## 2021-04-18 ENCOUNTER — Encounter: Payer: 59 | Admitting: Vascular Surgery

## 2021-04-18 ENCOUNTER — Ambulatory Visit (INDEPENDENT_AMBULATORY_CARE_PROVIDER_SITE_OTHER): Payer: 59 | Admitting: Vascular Surgery

## 2021-04-18 ENCOUNTER — Encounter: Payer: Self-pay | Admitting: Vascular Surgery

## 2021-04-18 ENCOUNTER — Other Ambulatory Visit: Payer: Self-pay

## 2021-04-18 VITALS — BP 94/63 | HR 101 | Temp 97.3°F | Resp 16 | Ht 67.0 in | Wt 146.0 lb

## 2021-04-18 DIAGNOSIS — I6523 Occlusion and stenosis of bilateral carotid arteries: Secondary | ICD-10-CM | POA: Diagnosis not present

## 2021-04-18 NOTE — Progress Notes (Signed)
[facility] [place]  ASSESSMENT / PLAN: [name] is a 70 y.o. male with asymptomatic right 75 - 99 % carotid artery stenosis.   The patient's carotid artery stenosis merits consideration of revascularization to reduce the risk of future stroke because of asymptomatic carotid stenosis >80% in a physiologically fit patient. I quoted the patient risk reduction from intervention of ~6% for asymptomatic stenosis based on data from the ACST/ACAS trials.  The patient is a good candidate for transcarotid artery revascularization because of history of radiation to the neck. We will check a CTA of head and neck to plan to case.   The patient should continue best medical therapy for carotid artery stenosis including: Complete cessation from all tobacco products. Blood glucose control with goal A1c < 7%. Blood pressure control with goal blood pressure < 140/90 mmHg. Lipid reduction therapy with goal LDL-C <100 mg/dL (<70 if symptomatic from carotid artery stenosis).  Aspirin 81mg  PO QD.  Atorvastatin 40-80mg  PO QD (or other "high intensity" statin therapy).  Follow up with me in 1-2 weeks after CTA. We will initiate plavix at that time if he is an anatomic candidate for TCAR.  CHIEF COMPLAINT: asymptomatic cartoid artery stenosis  HISTORY OF PRESENT ILLNESS: [name] is a 70 y.o. male referred to clinic for evaluation of progression of asymptomatic carotid artery stenosis on duplex ultrasound.  He is a patient of Dr. Alvester Chou who is followed him for severe coronary artery disease.  He underwent CABG x5 in 2009 followed by PCI in 2017 requiring intervention.  He also had a squamous cell carcinoma of the base of his tongue she was treated with multiple rounds of external beam radiation.  Surveillance carotid duplex performed 04/11/2021 revealed a progression of disease with severe right internal carotid artery stenosis.  He was referred to the [facility] for further evaluation.   Patient reports no neurologic symptoms attributable to his carotid artery.  He specifically denies unilateral weakness or numbness, amaurosis, difficulty speaking or swallowing, facial droop.  Past Medical History:  Diagnosis Date   Anxiety    Arthritis    "NECK, HANDS, KNEES" (06/14/2016)   CAD (coronary artery disease)    Carotid artery stenosis    Complication of anesthesia    Reports that he had trouble swallowing post anesthesia- CABG   COPD (chronic obstructive pulmonary disease) (HCC) 12/11/2011   r/s mv - EF 72%; exercise capacity 13 METS; no exercised induced ischemic EKG changes   Depression, major    Dyspnea    with exertion   GERD (gastroesophageal reflux disease)    HTN (hypertension), benign 04/16/2008   echo - EF >55%; no regional wall or valvular abnormalities   Hyperlipidemia    statin intolerant   Hypothyroidism    Migraine    "crippling headaches as a kid; silent migraines now, no pain, couple times/week" (06/14/2016)   Prostate cancer ([place]) 04/2019   recent dx - Dr Lawerance Bach   S/P angioplasty with stent 06/14/16 PCI & DES of high grade AV groove LCX and 1st OM  06/15/2016   Squamous cell cancer of tongue ([place]) ~ 2002   "35 radiation treatments at [facility]"   Subclavian steal syndrome 06/17/2012   carotid doppler - R systolic brachial pressure 175mmHg, L 169mmHg; R subclavian artery - proxmial obsstruction w/ abnormal monophasic waveforms, R ECA known occlusive disease; L ECA narrowing w/ 70-99% diameter reductiona   TIA (transient ischemic attack) 2005   Viral hepatitis 1970s   "  non specific"    Past Surgical History:  Procedure Laterality Date   BACK SURGERY     BIOPSY  02/20/2018   Procedure: BIOPSY;  Surgeon: Juanita Craver, MD;  Location: [facility];  Service: Endoscopy;;   BIOPSY TONGUE  ~ 2002   "base of tongue and uvula"   CARDIAC CATHETERIZATION  04/26/2008   L circumflex 99% stenosed midportion straddling a marginal branch; RCA total priximally w/  grade 2 L to R collaterals; renal arteries widely patent; LAD 30% segmental proximal stenosis   CARDIAC CATHETERIZATION N/A 05/14/2016   Procedure: Left Heart Cath and Cors/Grafts Angiography;  Surgeon: [name], MD;  Location: [place] [facility];  Service: Cardiovascular;  Laterality: N/A;   CARDIAC CATHETERIZATION N/A 06/14/2016   Procedure: Coronary Stent Intervention;  Surgeon: [name], MD;  Location: [place] [facility];  Service: Cardiovascular;  Laterality: N/A;   CERVICAL DISC SURGERY  2000   [place]   COLONOSCOPY WITH PROPOFOL N/A 02/20/2018   Procedure: COLONOSCOPY WITH PROPOFOL;  Surgeon: Juanita Craver, MD;  Location: [facility];  Service: Endoscopy;  Laterality: N/A;   CORONARY ANGIOPLASTY WITH STENT PLACEMENT     CORONARY ARTERY BYPASS GRAFT  2009   x5; LIMA to LAd, sequential vein to first diagonal branch, first and second obtuse marginal branches; vein to PDA   ESOPHAGOGASTRODUODENOSCOPY (EGD) WITH PROPOFOL N/A 02/20/2018   Procedure: ESOPHAGOGASTRODUODENOSCOPY (EGD) WITH PROPOFOL;  Surgeon: Juanita Craver, MD;  [facility];  Service: Endoscopy;  Laterality: N/A;   INSERTION OF MESH N/A 10/29/2017   Procedure: INSERTION OF MESH;  Surgeon: Donnie Mesa, MD;  Location: [facility];  Service: General;  Laterality: N/A;   LUMBAR [place] SURGERY  06/17/2019   LUMBAR 2 & 3    LUMBAR LAMINECTOMY/DECOMPRESSION MICRODISCECTOMY N/A 06/17/2019   Procedure: Lumbar two -Lumbar three decompression and disectomy;  Surgeon: Melina Schools, MD;  Location: [facility];  Service: Orthopedics;  Laterality: N/A;  3 hrs   POLYPECTOMY  02/20/2018   Procedure: POLYPECTOMY;  Surgeon: Juanita Craver, MD;  [facility];  Service: Endoscopy;;   SHOULDER ARTHROSCOPY WITH SUBACROMIAL DECOMPRESSION AND OPEN ROTATOR C Left 09/28/2013   Procedure: LEFT SHOULDER ARTHROSCOPY WITH SUBACROMIAL DECOMPRESSION AND MINI OPEN ROTATOR CUFF REPAIR, OPEN DISTAL CLAVICLE RESECTION AND OPEN BICEP TENDODESIS ;   Surgeon: Augustin Schooling, MD;  Location: [facility];  Service: Orthopedics;  Laterality: Left;   UMBILICAL HERNIA REPAIR N/A 10/29/2017   Procedure: UMBILICAL HERNIA REPAIR;  Surgeon: Donnie Mesa, MD;  Location: [facility];  Service: General;  Laterality: N/A;    Family History  Problem Relation Age of Onset   Cancer Mother        ovarian   Stroke Father    Diabetes Father    Arthritis Father    Hypertension Brother     Social History   Socioeconomic History   Marital status: Married    Spouse name: Not on file   Number of children: Not on file   Years of education: Not on file   Highest education level: Not on file  Occupational History   Not on file  Tobacco Use   Smoking status: Former    Packs/day: 3.00    Years: 30.00    Pack years: 90.00    Types: Cigarettes    Quit date: 01/29/2001    Years since quitting: 20.2   Smokeless tobacco: Never  Vaping Use   Vaping Use: Never used  Substance and Sexual Activity   Alcohol  use: Yes    Alcohol/week: 35.0 standard drinks    Types: 35 Cans of beer per week    Comment: 4-5 day beers   Drug use: No   Sexual activity: Not on file  Other Topics Concern   Not on file  Social History Narrative   Not on file   Social Determinants of Health   Financial Resource Strain: Not on file  Food Insecurity: Not on file  Transportation Needs: Not on file  Physical Activity: Not on file  Stress: Not on file  Social Connections: Not on file  Intimate Partner Violence: Not on file    Allergies  Allergen Reactions   Crestor [Rosuvastatin Calcium] Other (See Comments)    Difficulty swallowing   Tramadol Shortness Of Breath   Lisinopril Cough   Atorvastatin    Gabapentin Other (See Comments)    Hallucinations   Other    Tricor [Fenofibrate] Other (See Comments)    UNSPECIFIED REACTION   Lipitor [Atorvastatin Calcium] Other (See Comments)    Muscle spasms    Current Outpatient Medications  Medication Sig Dispense Refill    albuterol (VENTOLIN HFA) 108 (90 Base) MCG/ACT inhaler Inhale 2 puffs into the lungs every 6 (six) hours as needed for wheezing or shortness of breath. 18 g 5   Alirocumab (PRALUENT) 150 MG/ML SOAJ INJECT 150 MG INTO THE SKIN EVERY 14 (FOURTEEN) DAYS. 2 mL 11   amLODipine (NORVASC) 10 MG tablet TAKE 1 TABLET BY MOUTH DAILY 90 tablet 3   amphetamine-dextroamphetamine (ADDERALL) 20 MG tablet Take one tablet by mouth in the morning, one tab at noon and one tablet at 4pm. 270 tablet 0   hydrochlorothiazide (MICROZIDE) 12.5 MG capsule TAKE 1 CAPSULE BY MOUTH DAILY 90 capsule 3   levothyroxine (SYNTHROID) 75 MCG tablet TAKE 1 TABLET BY MOUTH ONCE DAILY 90 tablet 0   lidocaine (XYLOCAINE) 5 % ointment Apply 1 application topically as needed. 35.44 g 0   LORazepam (ATIVAN) 1 MG tablet TAKE 1 TABLET FOUR TIMES DAILY AS NEEDED.  PER DOCTOR: MAKE APPT NOW! 120 tablet 1   pantoprazole (PROTONIX) 40 MG tablet TAKE 1 TABLET (40 MG TOTAL) BY MOUTH DAILY. 90 tablet 3   tadalafil (CIALIS) 5 MG tablet Take 1 tablet (5 mg total) by mouth daily. Take 3-5 times a week for penile rehabilitation. May take up to 20mg  as needed for intercourse. 30 tablet 2   Tetrahydrozoline HCl (VISINE OP) Place 2-3 drops into both eyes 2 (two) times daily as needed (for dry eyes).     Vilazodone HCl (VIIBRYD) 40 MG TABS Take 1/2 tablet by mouth daily with a full meal for 7 days then increase to 1 tablet daily 30 tablet 0   desvenlafaxine (PRISTIQ) 50 MG 24 hr tablet Take 1 tablet by mouth every morning **Discontinue Buproprion** 90 tablet 0   Halcinonide (HALOG) 0.1 % CREA Apply 1 application topically as directed. 1.5 g 0   No current facility-administered medications for this visit.    REVIEW OF SYSTEMS:  [X]  denotes positive finding, [ ]  denotes negative finding Cardiac  Comments:  Chest pain or chest pressure:    Shortness of breath upon exertion:    Short of breath when lying flat:    Irregular heart rhythm:         Vascular    Pain in calf, thigh, or hip brought on by ambulation:    Pain in feet at night that wakes you up from your sleep:  Blood clot in your veins:    Leg swelling:         Pulmonary    Oxygen at home:    Productive cough:     Wheezing:         Neurologic    Sudden weakness in arms or legs:     Sudden numbness in arms or legs:     Sudden onset of difficulty speaking or slurred speech:    Temporary loss of vision in one eye:     Problems with dizziness:         Gastrointestinal    Blood in stool:     Vomited blood:         Genitourinary    Burning when urinating:     Blood in urine:        Psychiatric    Major depression:         Hematologic    Bleeding problems:    Problems with blood clotting too easily:        Skin    Rashes or ulcers:        Constitutional    Fever or chills:      PHYSICAL EXAM Vitals:   04/18/21 1000 04/18/21 1004  BP: (!) 148/88 94/63  Pulse: (!) 103 (!) 101  Resp: 16   Temp: (!) 97.3 F (36.3 C)   TempSrc: Temporal   SpO2: 98%   Weight: 146 lb (66.2 kg)   Height: 5\' 7"  (1.702 m)     Constitutional: Thin, but well appearing. no distress. Appears marginally nourished.  Neurologic: CN intact. no focal findings. no sensory loss. Psychiatric:  Mood and affect symmetric and appropriate. Eyes:  No icterus. No conjunctival pallor. Ears, nose, throat: no external evidence of radiotherapy (e.g. "woody" skin). mucous membranes moist. Midline trachea.  Cardiac: regular rate and rhythm.  Respiratory:  unlabored. Abdominal:  soft, non-tender, non-distended.  Peripheral vascular: trace R radial pulse. 2+ L radial pulse. 2+ popliteal pulses. Extremity: no edema. no cyanosis. no pallor.  Skin: no gangrene. no ulceration.  Lymphatic: no Stemmer's sign. no palpable lymphadenopathy.  PERTINENT LABORATORY AND RADIOLOGIC DATA  Most recent CBC CBC Latest Ref Rng & Units 03/30/2021 03/18/2020 06/10/2019  WBC 3.8 - 10.8 Thousand/uL 3.2(L) 4.3  5.3  Hemoglobin 13.2 - 17.1 g/dL 12.9(L) 16.0 16.3  Hematocrit 38.5 - 50.0 % 38.0(L) 47.2 49.2  Platelets 140 - 400 Thousand/uL 174 192 223     Most recent CMP CMP Latest Ref Rng & Units 03/30/2021 03/18/2020 06/10/2019  Glucose 65 - 99 mg/dL 91 109(H) 171(H)  BUN 7 - 25 mg/dL 18 14 18   Creatinine 0.70 - 1.25 mg/dL 1.09 1.19 1.37(H)  Sodium 135 - 146 mmol/L 143 143 138  Potassium 3.5 - 5.3 mmol/L 3.6 4.1 3.4(L)  Chloride 98 - 110 mmol/L 104 103 102  CO2 20 - 32 mmol/L 31 30 26   Calcium 8.6 - 10.3 mg/dL 8.9 9.5 9.3  Total Protein 6.1 - 8.1 g/dL 5.7(L) 6.7 -  Total Bilirubin 0.2 - 1.2 mg/dL 0.6 0.5 -  Alkaline Phos 40 - 115 U/L - - -  AST 10 - 35 U/L 20 26 -  ALT 9 - 46 U/L 21 22 -    Renal function Estimated Creatinine Clearance: 59.8 mL/min (by C-G formula based on SCr of 1.09 mg/dL).  Hgb A1c MFr Bld (%)  Date Value  01/01/2017 5.5    LDL Cholesterol (Calc)  Date Value Ref Range Status  03/30/2021 52  mg/dL (calc) Final    Comment:    Reference range: <100 . Desirable range <100 mg/dL for primary prevention;   <70 mg/dL for patients with CHD or diabetic patients  with > or = 2 CHD risk factors. Marland Kitchen LDL-C is now calculated using the Martin-Hopkins  calculation, which is a validated novel method providing  better accuracy than the Friedewald equation in the  estimation of LDL-C.  Cresenciano Genre et al. Annamaria Helling. 8338;250(53): 2061-2068  (http://education.[employer].com/faq/FAQ164)      Carotid Arterial Duplex Study   Patient Name:  [name]  Date of Exam:   04/11/2021  Medical Rec #: [id number]        Accession #:    [id number]  Date of Birth: 04-Jan-1951         Patient Gender: M  Patient Age:   70Y  Exam Location:  [facility]  Procedure:      VAS US CAROTID  Referring Phys: [id number] JONATHAN J BERRY    ---------------------------------------------------------------------------  -----     Indications:                           Carotid artery disease, Speech                                          disturbance, Numbness and Patient  has had                                         left face numbness which occurs  with                                         positional changes. He also  indicates the                                         last few months having a hard time                                         articulating his words and will  notice                                         drooling.  Risk Factors:                          Hypertension, hyperlipidemia, past                                         history of smoking, coronary artery  disease.  Other Factors:                         Patient indicates he has issues  with                                         statins and is presently not taking  any.  Comparison Study:                      Prior carotid exam on 04/12/2020  showed                                         highest velocities in right  proximal ICA                                         215/62 cm/s and left mid ICA 137/29  cm/s.                                         No mention of right subclavian  steal                                         syndrome.                                            Prior carotid duplex exam  04/28/2019                                         highest velocities in right  proximal ICA                                         256/74 cm/s and left mid ICA 247/61  cm/s.                                         Right subclavian steal syndrome  noted                                         with 30 mmHg brachial arm  difference.  Pre-Surgical Evaluation & Surgical     Stenosis at RICA only. ICA is not  normal  Correlation                            past stenosis. A kink/loop was  present in  the Right ICA.Right bifurcation is                                         located near the Hyoid Notch.   Performing  Technologist: Salvadore Dom RVT, [place] (AE), RDMS   Supporting Technologist: Leavy Cella RDCS      Examination Guidelines: A complete evaluation includes B-mode imaging,  spectral  Doppler, color Doppler, and power Doppler as needed of all accessible  portions  of each vessel. Bilateral testing is considered an integral part of a  complete  examination. Limited examinations for reoccurring indications may be  performed  as noted.      Right Carotid Findings:  +----------+--------+--------+--------+--------------------+---------------  ----+            PSV cm/sEDV cm/sStenosisPlaque Description  Comments              +----------+--------+--------+--------+--------------------+---------------  ----+  CCA Prox  71      11              homogeneous                               +----------+--------+--------+--------+--------------------+---------------  ----+  CCA Mid   42      9       <50%                                              +----------+--------+--------+--------+--------------------+---------------  ----+  CCA Distal40      16      <50%                                              +----------+--------+--------+--------+--------------------+---------------  ----+  ICA Prox  451     156     80-99%  heterogenous                              +----------+--------+--------+--------+--------------------+---------------  ----+  ICA Mid   48      14                                  tortuous              +----------+--------+--------+--------+--------------------+---------------  ----+  ICA Distal53      22                                                        +----------+--------+--------+--------+--------------------+---------------  ----+  ECA       80      17              calcific, focal and low resistance  flow  heterogenous                               +----------+--------+--------+--------+--------------------+---------------  ----+   +----------+--------+-------+---------+-------------------+            PSV cm/sEDV cmsDescribe Arm Pressure (mmHG)  +----------+--------+-------+---------+-------------------+  [id number]            [id number]                  +----------+--------+-------+---------+-------------------+   +---------+--------+--+--------+--+----------------------+  VertebralPSV cm/s25EDV cm/s10Antegrade and Atypical  +---------+--------+--+--------+--+----------------------+   Right brachial artery antecubital fossa 34 cm/s, monophasic   Left Carotid Findings:  +----------+--------+--------+--------+-------------------+----------------  ----+            PSV cm/sEDV cm/sStenosisPlaque Description Comments                +----------+--------+--------+--------+-------------------+----------------  ----+  CCA Prox  123     32                                                         +----------+--------+--------+--------+-------------------+----------------  ----+  CCA Mid   114     37      <50%    homogeneous                                +----------+--------+--------+--------+-------------------+----------------  ----+  CCA Distal92      27      <50%                                               +----------+--------+--------+--------+-------------------+----------------  ----+  ICA Prox  102     31              focal, calcific and.                                                        heterogenous                               +----------+--------+--------+--------+-------------------+----------------  ----+  ICA Mid   228     64      60-79%                     tortuous with                                                               tubulent flow  more  distal. Could   this                                                         be  fibromuscular                                                           dysplasia (FMD)  ? No                                                       evidence of  plaque                                                         noted                  +----------+--------+--------+--------+-------------------+----------------  ----+  ICA Distal165     44                                                         +----------+--------+--------+--------+-------------------+----------------  ----+  ECA                       Occluded                                           +----------+--------+--------+--------+-------------------+----------------  ----+   +----------+--------+--------+----------------+-------------------+            PSV cm/sEDV cm/sDescribe        Arm Pressure (mmHG)  +----------+--------+--------+----------------+-------------------+  [id number]             Multiphasic, [id number]                  +----------+--------+--------+----------------+-------------------+   +---------+--------+---+--------+--+---------+  VertebralPSV cm/s102EDV cm/s24Antegrade  +---------+--------+---+--------+--+---------+   Left brachial artery antecubital fossa 66 cm/s, triphasic          Findings reported to Dr. Eleonore Chiquito (DOD) and Dr. Kennon Holter email through  [facility] at 11:30 am Patient being referred to [facility] and was instructed about  stroke symptoms and sent home. .   Summary:  Right Carotid: Velocities in the right ICA are consistent with a 80-99%                 stenosis. Non-hemodynamically significant plaque <50% noted  in                 the CCA.   Left Carotid: Velocities in the left ICA are consistent with a 60-79%  stenosis.  Mid to distal ICA has the appearance of possible  fibromuscular                dysplasia (FMD) with no  evidence of plaque seen.                Non-hemodynamically significant plaque <50% noted in the  CCA. The                ECA now appears occluded.   Vertebrals:  Bilateral vertebral arteries demonstrate antegrade flow.  Right               vertebral has abnormal flow.  Subclavians: Right subclavian artery flow was disturbed. Normal flow               hemodynamics were seen in the left subclavian artery. There  is 33               mmHg difference in brachial arterial pressures, right is  lower than               left. Right moderate subclavian steal syndrome.   *See table(s) above for measurements and observations.       Electronically signed by Carlyle Dolly MD on 04/13/2021 at 11:02:48 AM.  Yevonne Aline. Stanford Breed, MD [facility] Phone Number: [phone] 04/18/2021 11:17 AM

## 2021-04-19 ENCOUNTER — Encounter: Payer: 59 | Admitting: Vascular Surgery

## 2021-04-21 ENCOUNTER — Other Ambulatory Visit: Payer: Self-pay

## 2021-04-21 DIAGNOSIS — I6523 Occlusion and stenosis of bilateral carotid arteries: Secondary | ICD-10-CM

## 2021-04-25 ENCOUNTER — Other Ambulatory Visit (HOSPITAL_BASED_OUTPATIENT_CLINIC_OR_DEPARTMENT_OTHER): Payer: Self-pay

## 2021-04-25 MED ORDER — TRAZODONE HCL 50 MG PO TABS
ORAL_TABLET | ORAL | 1 refills | Status: DC
  Filled 2021-04-25: qty 90, 30d supply, fill #0

## 2021-04-28 ENCOUNTER — Ambulatory Visit (HOSPITAL_COMMUNITY)
Admission: RE | Admit: 2021-04-28 | Discharge: 2021-04-28 | Disposition: A | Discharge status: Home / Self Care | Payer: 59 | Source: Ambulatory Visit | Attending: Vascular Surgery | Admitting: Vascular Surgery

## 2021-04-28 ENCOUNTER — Other Ambulatory Visit: Payer: Self-pay

## 2021-04-28 DIAGNOSIS — I6523 Occlusion and stenosis of bilateral carotid arteries: Secondary | ICD-10-CM | POA: Insufficient documentation

## 2021-04-28 DIAGNOSIS — I6503 Occlusion and stenosis of bilateral vertebral arteries: Secondary | ICD-10-CM | POA: Diagnosis not present

## 2021-04-28 DIAGNOSIS — I672 Cerebral atherosclerosis: Secondary | ICD-10-CM | POA: Diagnosis not present

## 2021-04-28 DIAGNOSIS — M2669 Other specified disorders of temporomandibular joint: Secondary | ICD-10-CM | POA: Diagnosis not present

## 2021-04-28 DIAGNOSIS — Q283 Other malformations of cerebral vessels: Secondary | ICD-10-CM | POA: Diagnosis not present

## 2021-04-28 MED ORDER — SODIUM CHLORIDE (PF) 0.9 % IJ SOLN
INTRAMUSCULAR | Status: AC
  Filled 2021-04-28: qty 50

## 2021-04-28 MED ORDER — IOHEXOL 350 MG/ML SOLN
80.0000 mL | Freq: Once | INTRAVENOUS | Status: AC | PRN
  Administered 2021-04-28: 80 mL via INTRAVENOUS

## 2021-05-01 ENCOUNTER — Other Ambulatory Visit (HOSPITAL_BASED_OUTPATIENT_CLINIC_OR_DEPARTMENT_OTHER): Payer: Self-pay

## 2021-05-01 MED FILL — Amlodipine Besylate Tab 10 MG (Base Equivalent): ORAL | 90 days supply | Qty: 90 | Fill #1 | Status: AC

## 2021-05-01 NOTE — H&P (View-Only) (Signed)
[facility]   ASSESSMENT / PLAN: [name] is a 70 y.o. male with asymptomatic right 2 - 99 % carotid artery stenosis.   The patient's carotid artery stenosis merits consideration of revascularization to reduce the risk of future stroke because of asymptomatic carotid stenosis >80% in a physiologically fit patient. I quoted the patient risk reduction from intervention of ~6% for asymptomatic stenosis based on data from the ACST/ACAS trials.  The patient should continue best medical therapy for carotid artery stenosis including: Complete cessation from all tobacco products. Blood glucose control with goal A1c < 7%. Blood pressure control with goal blood pressure < 140/90 mmHg. Lipid reduction therapy with goal LDL-C <100 mg/dL (<70 if symptomatic from carotid artery stenosis).  Aspirin '81mg'$  PO QD.  Start ticagrelor '90mg'$  PO BID. Load '180mg'$  PO today. Atorvastatin 40-'80mg'$  PO QD (or other "high intensity" statin therapy).  Plan R TCAR 05/05/21  CHIEF COMPLAINT: asymptomatic cartoid artery stenosis  HISTORY OF PRESENT ILLNESS: [name] is a 70 y.o. male referred to clinic for evaluation of progression of asymptomatic carotid artery stenosis on duplex ultrasound.  He is a patient of Dr. Alvester Chou who is followed him for severe coronary artery disease.  He underwent CABG x5 in 2009 followed by PCI in 2017 requiring intervention.  He also had a squamous cell carcinoma of the base of his tongue she was treated with multiple rounds of external beam radiation.  Surveillance carotid duplex performed 04/11/2021 revealed a progression of disease with severe right internal carotid artery stenosis.  He was referred to the [facility] for further evaluation.  Patient reports no neurologic symptoms attributable to his carotid artery.  He specifically denies unilateral weakness or numbness, amaurosis, difficulty speaking or swallowing, facial droop.  05/02/21: Returns to review CT  angiogram.  No interval neurologic events.  Past Medical History:  Diagnosis Date   Anxiety    Arthritis    "NECK, HANDS, KNEES" (06/14/2016)   CAD (coronary artery disease)    Carotid artery stenosis    Complication of anesthesia    Reports that he had trouble swallowing post anesthesia- CABG   COPD (chronic obstructive pulmonary disease) (HCC) 12/11/2011   r/s mv - EF 72%; exercise capacity 13 METS; no exercised induced ischemic EKG changes   Depression, major    Dyspnea    with exertion   GERD (gastroesophageal reflux disease)    HTN (hypertension), benign 04/16/2008   echo - EF >55%; no regional wall or valvular abnormalities   Hyperlipidemia    statin intolerant   Hypothyroidism    Migraine    "crippling headaches as a kid; silent migraines now, no pain, couple times/week" (06/14/2016)   Prostate cancer ([place]) 04/2019   recent dx - Dr Lawerance Bach   S/P angioplasty with stent 06/14/16 PCI & DES of high grade AV groove LCX and 1st OM  06/15/2016   Squamous cell cancer of tongue ([place]) ~ 2002   "35 radiation treatments at [facility]"   Subclavian steal syndrome 06/17/2012   carotid doppler - R systolic brachial pressure 135mHg, L 1586mg; R subclavian artery - proxmial obsstruction w/ abnormal monophasic waveforms, R ECA known occlusive disease; L ECA narrowing w/ 70-99% diameter reductiona   TIA (transient ischemic attack) 2005   Viral hepatitis 1970s   "non specific"    Past Surgical History:  Procedure Laterality Date   BACK SURGERY     BIOPSY  02/20/2018   Procedure: BIOPSY;  Surgeon: MaJuanita Craver  MD;  Location: [facility];  Service: Endoscopy;;   BIOPSY TONGUE  ~ 2002   "base of tongue and uvula"   CARDIAC CATHETERIZATION  04/26/2008   L circumflex 99% stenosed midportion straddling a marginal branch; RCA total priximally w/ grade 2 L to R collaterals; renal arteries widely patent; LAD 30% segmental proximal stenosis   CARDIAC CATHETERIZATION N/A 05/14/2016   Procedure:  Left Heart Cath and Cors/Grafts Angiography;  Surgeon: [name], MD;  Location: [place] [facility];  Service: Cardiovascular;  Laterality: N/A;   CARDIAC CATHETERIZATION N/A 06/14/2016   Procedure: Coronary Stent Intervention;  Surgeon: [name], MD;  Location: [place] [facility];  Service: Cardiovascular;  Laterality: N/A;   CERVICAL DISC SURGERY  2000   [place]   COLONOSCOPY WITH PROPOFOL N/A 02/20/2018   Procedure: COLONOSCOPY WITH PROPOFOL;  Surgeon: Juanita Craver, MD;  Location: [facility];  Service: Endoscopy;  Laterality: N/A;   CORONARY ANGIOPLASTY WITH STENT PLACEMENT     CORONARY ARTERY BYPASS GRAFT  2009   x5; LIMA to LAd, sequential vein to first diagonal branch, first and second obtuse marginal branches; vein to PDA   ESOPHAGOGASTRODUODENOSCOPY (EGD) WITH PROPOFOL N/A 02/20/2018   Procedure: ESOPHAGOGASTRODUODENOSCOPY (EGD) WITH PROPOFOL;  Surgeon: Juanita Craver, MD;  [facility]: [facility];  Service: Endoscopy;  Laterality: N/A;   INSERTION OF MESH N/A 10/29/2017   Procedure: INSERTION OF MESH;  Surgeon: Donnie Mesa, MD;  Location: [place];  Service: General;  Laterality: N/A;   LUMBAR [place] SURGERY  06/17/2019   LUMBAR 2 & 3    LUMBAR LAMINECTOMY/DECOMPRESSION MICRODISCECTOMY N/A 06/17/2019   Procedure: Lumbar two -Lumbar three decompression and disectomy;  Surgeon: Melina Schools, MD;  Location: [place];  Service: Orthopedics;  Laterality: N/A;  3 hrs   POLYPECTOMY  02/20/2018   Procedure: POLYPECTOMY;  Surgeon: Juanita Craver, MD;  [facility];  Service: Endoscopy;;   SHOULDER ARTHROSCOPY WITH SUBACROMIAL DECOMPRESSION AND OPEN ROTATOR C Left 09/28/2013   Procedure: LEFT SHOULDER ARTHROSCOPY WITH SUBACROMIAL DECOMPRESSION AND MINI OPEN ROTATOR CUFF REPAIR, OPEN DISTAL CLAVICLE RESECTION AND OPEN BICEP TENDODESIS ;  Surgeon: Augustin Schooling, MD;  Location: [place];  Service: Orthopedics;  Laterality: Left;   UMBILICAL HERNIA REPAIR N/A 10/29/2017   Procedure:  UMBILICAL HERNIA REPAIR;  Surgeon: Donnie Mesa, MD;  Location: [place];  Service: General;  Laterality: N/A;    Family History  Problem Relation Age of Onset   Cancer Mother        ovarian   Stroke Father    Diabetes Father    Arthritis Father    Hypertension Brother     Social History   Socioeconomic History   Marital status: Married    Spouse name: Not on file   Number of children: Not on file   Years of education: Not on file   Highest education level: Not on file  Occupational History   Not on file  Tobacco Use   Smoking status: Former    Packs/day: 3.00    Years: 30.00    Pack years: 90.00    Types: Cigarettes    Quit date: 01/29/2001    Years since quitting: 20.2   Smokeless tobacco: Never  Vaping Use   Vaping Use: Never used  Substance and Sexual Activity   Alcohol use: Yes    Alcohol/week: 35.0 standard drinks    Types: 35 Cans of beer per week    Comment: 4-5 day beers   Drug use: No  Sexual activity: Not on file  Other Topics Concern   Not on file  Social History Narrative   Not on file   Social Determinants of Health   Financial Resource Strain: Not on file  Food Insecurity: Not on file  Transportation Needs: Not on file  Physical Activity: Not on file  Stress: Not on file  Social Connections: Not on file  Intimate Partner Violence: Not on file    Allergies  Allergen Reactions   Crestor [Rosuvastatin Calcium] Other (See Comments)    Difficulty swallowing   Tramadol Shortness Of Breath   Lisinopril Cough   Atorvastatin    Gabapentin Other (See Comments)    Hallucinations   Other    Tricor [Fenofibrate] Other (See Comments)    UNSPECIFIED REACTION   Lipitor [Atorvastatin Calcium] Other (See Comments)    Muscle spasms    Current Outpatient Medications  Medication Sig Dispense Refill   albuterol (VENTOLIN HFA) 108 (90 Base) MCG/ACT inhaler Inhale 2 puffs into the lungs every 6 (six) hours as needed for wheezing or shortness of breath.  18 g 5   Alirocumab (PRALUENT) 150 MG/ML SOAJ INJECT 150 MG INTO THE SKIN EVERY 14 (FOURTEEN) DAYS. 2 mL 11   amLODipine (NORVASC) 10 MG tablet TAKE 1 TABLET BY MOUTH DAILY 90 tablet 3   amphetamine-dextroamphetamine (ADDERALL) 20 MG tablet Take one tablet by mouth in the morning, one tab at noon and one tablet at 4pm. 270 tablet 0   desvenlafaxine (PRISTIQ) 50 MG 24 hr tablet Take 1 tablet by mouth every morning **Discontinue Buproprion** 90 tablet 0   Halcinonide (HALOG) 0.1 % CREA Apply 1 application topically as directed. 1.5 g 0   hydrochlorothiazide (MICROZIDE) 12.5 MG capsule TAKE 1 CAPSULE BY MOUTH DAILY 90 capsule 3   levothyroxine (SYNTHROID) 75 MCG tablet TAKE 1 TABLET BY MOUTH ONCE DAILY 90 tablet 0   lidocaine (XYLOCAINE) 5 % ointment Apply 1 application topically as needed. 35.44 g 0   LORazepam (ATIVAN) 1 MG tablet TAKE 1 TABLET FOUR TIMES DAILY AS NEEDED.  PER DOCTOR: MAKE APPT NOW! 120 tablet 1   pantoprazole (PROTONIX) 40 MG tablet TAKE 1 TABLET (40 MG TOTAL) BY MOUTH DAILY. 90 tablet 3   tadalafil (CIALIS) 5 MG tablet Take 1 tablet (5 mg total) by mouth daily. Take 3-5 times a week for penile rehabilitation. May take up to '20mg'$  as needed for intercourse. 30 tablet 2   Tetrahydrozoline HCl (VISINE OP) Place 2-3 drops into both eyes 2 (two) times daily as needed (for dry eyes).     traZODone (DESYREL) 50 MG tablet Take 1 - 3 tablets by mouth at bedtime as needed 90 tablet 1   Vilazodone HCl (VIIBRYD) 40 MG TABS Take 1/2 tablet by mouth daily with a full meal for 7 days then increase to 1 tablet daily 30 tablet 0   No current facility-administered medications for this visit.    REVIEW OF SYSTEMS:  '[X]'$  denotes positive finding, '[ ]'$  denotes negative finding Cardiac  Comments:  Chest pain or chest pressure:    Shortness of breath upon exertion:    Short of breath when lying flat:    Irregular heart rhythm:        Vascular    Pain in calf, thigh, or hip brought on by  ambulation:    Pain in feet at night that wakes you up from your sleep:     Blood clot in your veins:    Leg swelling:  Pulmonary    Oxygen at home:    Productive cough:     Wheezing:         Neurologic    Sudden weakness in arms or legs:     Sudden numbness in arms or legs:     Sudden onset of difficulty speaking or slurred speech:    Temporary loss of vision in one eye:     Problems with dizziness:         Gastrointestinal    Blood in stool:     Vomited blood:         Genitourinary    Burning when urinating:     Blood in urine:        Psychiatric    Major depression:         Hematologic    Bleeding problems:    Problems with blood clotting too easily:        Skin    Rashes or ulcers:        Constitutional    Fever or chills:      PHYSICAL EXAM Vitals:   05/02/21 1317  BP: (!) 176/71  Pulse: 61  Resp: 20  Temp: 98.1 F (36.7 C)  SpO2: 100%  Weight: 150 lb (68 kg)  Height: '5\' 7"'$  (1.702 m)   Constitutional: Thin, but well appearing. no distress. Appears marginally nourished.  Neurologic: CN intact. no focal findings. no sensory loss. Psychiatric:  Mood and affect symmetric and appropriate. Eyes:  No icterus. No conjunctival pallor. Ears, nose, throat: no external evidence of radiotherapy (e.g. "woody" skin). mucous membranes moist. Midline trachea.  Cardiac: regular rate and rhythm.  Respiratory:  unlabored. Abdominal:  soft, non-tender, non-distended.  Peripheral vascular: trace R radial pulse. 2+ L radial pulse. 2+ popliteal pulses. Extremity: no edema. no cyanosis. no pallor.  Skin: no gangrene. no ulceration.  Lymphatic: no Stemmer's sign. no palpable lymphadenopathy.  PERTINENT LABORATORY AND RADIOLOGIC DATA  Most recent CBC CBC Latest Ref Rng & Units 03/30/2021 03/18/2020 06/10/2019  WBC 3.8 - 10.8 Thousand/uL 3.2(L) 4.3 5.3  Hemoglobin 13.2 - 17.1 g/dL 12.9(L) 16.0 16.3  Hematocrit 38.5 - 50.0 % 38.0(L) 47.2 49.2  Platelets 140 - 400  Thousand/uL 174 192 223     Most recent CMP CMP Latest Ref Rng & Units 03/30/2021 03/18/2020 06/10/2019  Glucose 65 - 99 mg/dL 91 109(H) 171(H)  BUN 7 - 25 mg/dL '18 14 18  '$ Creatinine 0.70 - 1.25 mg/dL 1.09 1.19 1.37(H)  Sodium 135 - 146 mmol/L 143 143 138  Potassium 3.5 - 5.3 mmol/L 3.6 4.1 3.4(L)  Chloride 98 - 110 mmol/L 104 103 102  CO2 20 - 32 mmol/L '31 30 26  '$ Calcium 8.6 - 10.3 mg/dL 8.9 9.5 9.3  Total Protein 6.1 - 8.1 g/dL 5.7(L) 6.7 -  Total Bilirubin 0.2 - 1.2 mg/dL 0.6 0.5 -  Alkaline Phos 40 - 115 U/L - - -  AST 10 - 35 U/L 20 26 -  ALT 9 - 46 U/L 21 22 -    Renal function CrCl cannot be calculated (Patient's most recent lab result is older than the maximum 21 days allowed.).  Hgb A1c MFr Bld (%)  Date Value  01/01/2017 5.5    LDL Cholesterol (Calc)  Date Value Ref Range Status  03/30/2021 52 mg/dL (calc) Final    Comment:    Reference range: <100 . Desirable range <100 mg/dL for primary prevention;   <70 mg/dL for patients with CHD or diabetic patients  with > or = 2 CHD risk factors. Marland Kitchen LDL-C is now calculated using the Martin-Hopkins  calculation, which is a validated novel method providing  better accuracy than the Friedewald equation in the  estimation of LDL-C.  Cresenciano Genre et al. Annamaria Helling. [id number]): 2061-2068  (http://education.[facility].com/faq/FAQ164)      Carotid Arterial Duplex Study   Patient Name:  KOLTEN PAPPA  Date of Exam:   04/11/2021  Medical Rec #: [id number]        Accession #:    [id number]  Date of Birth: [birth year]         Patient Gender: M  Patient Age:   70Y  Exam Location:  [facility]  Procedure:      VAS US CAROTID  Referring Phys: [id number] JONATHAN J BERRY    ---------------------------------------------------------------------------  -----     Indications:                           Carotid artery disease, Speech                                         disturbance, Numbness and Patient  has had                                          left face numbness which occurs  with                                         positional changes. He also  indicates the                                         last few months having a hard time                                         articulating his words and will  notice                                         drooling.  Risk Factors:                          Hypertension, hyperlipidemia, past                                         history of smoking, coronary artery                                         disease.  Other Factors:                         Patient indicates he has  issues  with                                         statins and is presently not taking  any.  Comparison Study:                      Prior carotid exam on 04/12/2020  showed                                         highest velocities in right  proximal ICA                                         215/62 cm/s and left mid ICA 137/29  cm/s.                                         No mention of right subclavian  steal                                         syndrome.                                            Prior carotid duplex exam  04/28/2019                                         highest velocities in right  proximal ICA                                         256/74 cm/s and left mid ICA 247/61  cm/s.                                         Right subclavian steal syndrome  noted                                         with 30 mmHg brachial arm  difference.  Pre-Surgical Evaluation & Surgical     Stenosis at RICA only. ICA is not  normal  Correlation                            past stenosis. A kink/loop was  present in                                         the  Right ICA.Right bifurcation is                                         located near the [place].   Performing Technologist: Salvadore Dom RVT, [place] (AE), RDMS   Supporting Technologist: Leavy Cella RDCS       Examination Guidelines: A complete evaluation includes B-mode imaging,  spectral  Doppler, color Doppler, and power Doppler as needed of all accessible  portions  of each vessel. Bilateral testing is considered an integral part of a  complete  examination. Limited examinations for reoccurring indications may be  performed  as noted.      Right Carotid Findings:  +----------+--------+--------+--------+--------------------+---------------  ----+            PSV cm/sEDV cm/sStenosisPlaque Description  Comments              +----------+--------+--------+--------+--------------------+---------------  ----+  CCA Prox  71      11              homogeneous                               +----------+--------+--------+--------+--------------------+---------------  ----+  CCA Mid   42      9       <50%                                              +----------+--------+--------+--------+--------------------+---------------  ----+  CCA Distal40      16      <50%                                              +----------+--------+--------+--------+--------------------+---------------  ----+  ICA Prox  451     156     80-99%  heterogenous                              +----------+--------+--------+--------+--------------------+---------------  ----+  ICA Mid   48      14                                  tortuous              +----------+--------+--------+--------+--------------------+---------------  ----+  ICA Distal53      22                                                        +----------+--------+--------+--------+--------------------+---------------  ----+  ECA       80      17              calcific, focal and low resistance  flow  heterogenous                              +----------+--------+--------+--------+--------------------+---------------  ----+    +----------+--------+-------+---------+-------------------+            PSV cm/sEDV cmsDescribe Arm Pressure (mmHG)  +----------+--------+-------+---------+-------------------+  [id number]            [id number]                  +----------+--------+-------+---------+-------------------+   +---------+--------+--+--------+--+----------------------+  VertebralPSV cm/s25EDV cm/s10Antegrade and Atypical  +---------+--------+--+--------+--+----------------------+   Right brachial artery antecubital fossa 34 cm/s, monophasic   Left Carotid Findings:  +----------+--------+--------+--------+-------------------+----------------  ----+            PSV cm/sEDV cm/sStenosisPlaque Description Comments                +----------+--------+--------+--------+-------------------+----------------  ----+  CCA Prox  123     32                                                         +----------+--------+--------+--------+-------------------+----------------  ----+  CCA Mid   114     37      <50%    homogeneous                                +----------+--------+--------+--------+-------------------+----------------  ----+  CCA Distal92      27      <50%                                               +----------+--------+--------+--------+-------------------+----------------  ----+  ICA Prox  102     31              focal, calcific and.                                                        heterogenous                               +----------+--------+--------+--------+-------------------+----------------  ----+  ICA Mid   228     64      60-79%                     tortuous with                                                               tubulent flow  more  distal. Could  this                                                         be   fibromuscular                                                           dysplasia (FMD)  ? No                                                       evidence of  plaque                                                         noted                  +----------+--------+--------+--------+-------------------+----------------  ----+  ICA Distal165     44                                                         +----------+--------+--------+--------+-------------------+----------------  ----+  ECA                       Occluded                                           +----------+--------+--------+--------+-------------------+----------------  ----+   +----------+--------+--------+----------------+-------------------+            PSV cm/sEDV cm/sDescribe        Arm Pressure (mmHG)  +----------+--------+--------+----------------+-------------------+  [id number]             Multiphasic, [id number]                  +----------+--------+--------+----------------+-------------------+   +---------+--------+---+--------+--+---------+  VertebralPSV cm/s102EDV cm/s24Antegrade  +---------+--------+---+--------+--+---------+   Left brachial artery antecubital fossa 66 cm/s, triphasic          Findings reported to Dr. Eleonore Chiquito (DOD) and Dr. Kennon Holter email through  [facility] at 11:30 am Patient being referred to [facility] and was instructed about  stroke symptoms and sent home. .   Summary:  Right Carotid: Velocities in the right ICA are consistent with a 80-99%                 stenosis. Non-hemodynamically significant plaque <50% noted  in                 the CCA.   Left Carotid: Velocities in the left ICA are consistent with a 60-79%  stenosis.  Mid to distal ICA has the appearance of possible  fibromuscular                dysplasia (FMD) with no evidence of plaque seen.                Non-hemodynamically significant plaque  <50% noted in the  CCA. The                ECA now appears occluded.   Vertebrals:  Bilateral vertebral arteries demonstrate antegrade flow.  Right               vertebral has abnormal flow.  Subclavians: Right subclavian artery flow was disturbed. Normal flow               hemodynamics were seen in the left subclavian artery. There  is 33               mmHg difference in brachial arterial pressures, right is  lower than               left. Right moderate subclavian steal syndrome.   *See table(s) above for measurements and observations.       Electronically signed by Carlyle Dolly MD on 04/13/2021 at 11:02:48 AM.  CLINICAL DATA:  Bilateral carotid artery stenosis I65.23 (ICD-10-CM). Carotid stenosis screening, no risk factors.   EXAM: CT ANGIOGRAPHY HEAD AND NECK   TECHNIQUE: Multidetector CT imaging of the head and neck was performed using the standard protocol during bolus administration of intravenous contrast. Multiplanar CT image reconstructions and MIPs were obtained to evaluate the vascular anatomy. Carotid stenosis measurements (when applicable) are obtained utilizing NASCET criteria, using the distal internal carotid diameter as the denominator.   CONTRAST:  35m OMNIPAQUE IOHEXOL 350 MG/ML SOLN   COMPARISON:  MRI of the brain July 28, 2020. MR angiogram of the head and neck January 02, 2010.   FINDINGS: CT HEAD FINDINGS   Brain: No evidence of acute infarction, hemorrhage, hydrocephalus, extra-axial collection or mass lesion/mass effect.   Vascular: Calcified plaques in the bilateral carotid siphons. No hyperdense sign.   Skull: Negative for fracture or focal lesion. Degenerative changes of the left temporomandibular joint.   Sinuses: Imaged portions are clear.   Orbits: No acute finding.   Review of the MIP images confirms the above findings   CTA NECK FINDINGS   Aortic arch: Common origin of the innominate and left common carotid artery from  the aortic arch. Imaged portion shows no evidence of aneurysm or dissection. Atherosclerotic changes with calcified plaques without significant stenosis at the major neck arteries. Postsurgical changes from prior coronary bypass.   Right carotid system: Atherosclerotic changes along the right common carotid artery with ulcerated plaques. No hemodynamically significant stenosis. Atherosclerotic changes of the right carotid bifurcation with mixed density plaque resulting in 70% stenosis at the origin of the right ICA.   Left carotid system: Atherosclerotic changes along the left common carotid artery with ulcerated plaques. There is approximately 50% stenosis at its origin and at the mid segment. Atherosclerotic changes at the left carotid bifurcation with mixed density plaque without hemodynamically significant stenosis.   Vertebral arteries: Atherosclerotic changes at the proximal of the right subclavian artery resulting approximately 50% stenosis. There also atherosclerotic changes at the origin of the right vertebral artery resulting in mild stenosis. There is moderate stenosis of the right vertebral artery at the C6-7 level related to osteophyte. Occlusion of the right vertebral artery at the dural margin with  patent diminutive right PICA. Mild atherosclerotic changes of the left subclavian artery without hemodynamically significant stenosis. There also mild atherosclerotic changes at the origin of the left vertebral artery without hemodynamically significant stenosis. The dominant left vertebral artery is patent throughout its entire course with scattered atherosclerotic changes without hemodynamically significant stenosis.   Skeleton: Degenerative changes of the cervical spine left temporomandibular joint. Postsurgical changes from prior sternotomy. No acute findings.   Other neck: No acute findings.   Upper chest: Mild biapical pleural pulmonary scarring.   Review of the  MIP images confirms the above findings   CTA HEAD FINDINGS   Anterior circulation: Calcified atherosclerotic plaques in the bilateral carotid siphons resulting in mild-to-moderate stenosis at the right distal cavernous segment. Hypoplastic right A1 and A2 segments with a dominant bihemispheric left A2 segment. The bilateral ACA and MCA vascular trees are otherwise unremarkable.   Posterior circulation: Occluded intracranial right vertebral artery. Normal course and caliber of the intracranial left vertebral artery, basilar artery and bilateral posterior cerebral arteries   Venous sinuses: As permitted by contrast timing, patent.   Anatomic variants: As described above.   Review of the MIP images confirms the above findings   IMPRESSION: 1. Approximately 70% stenosis at the right carotid bifurcation. 2. Atherosclerotic changes of the right common carotid artery ulcerated plaques without hemodynamically significant stenosis. 3. Atherosclerotic changes of the left common carotid artery with ulcerated plaques in approximately 50% stenosis at the mid segment. 4. Atherosclerotic changes of the left carotid bifurcation without hemodynamically significant stenosis. 5. 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 6. Mild-to-moderate stenosis of the distal cavernous segment of the right ICA.     Electronically Signed   By: Pedro Earls M.D.   On: 05/01/2021 15:10  Yevonne Aline. Stanford Breed, MD [facility] Phone Number: [phone] 05/01/2021 12:13 PM

## 2021-05-01 NOTE — Progress Notes (Signed)
[facility] [place]  ASSESSMENT / PLAN: [name] is a 70 y.o. male with asymptomatic right 25 - 99 % carotid artery stenosis.   The patient's carotid artery stenosis merits consideration of revascularization to reduce the risk of future stroke because of asymptomatic carotid stenosis >80% in a physiologically fit patient. I quoted the patient risk reduction from intervention of ~6% for asymptomatic stenosis based on data from the ACST/ACAS trials.  The patient should continue best medical therapy for carotid artery stenosis including: Complete cessation from all tobacco products. Blood glucose control with goal A1c < 7%. Blood pressure control with goal blood pressure < 140/90 mmHg. Lipid reduction therapy with goal LDL-C <100 mg/dL (<70 if symptomatic from carotid artery stenosis).  Aspirin '81mg'$  PO QD.  Start ticagrelor '90mg'$  PO BID. Load '180mg'$  PO today. Atorvastatin 40-'80mg'$  PO QD (or other "high intensity" statin therapy).  Plan R TCAR 05/05/21  CHIEF COMPLAINT: asymptomatic cartoid artery stenosis  HISTORY OF PRESENT ILLNESS: [name] is a 70 y.o. male referred to clinic for evaluation of progression of asymptomatic carotid artery stenosis on duplex ultrasound.  He is a patient of Dr. Alvester Chou who is followed him for severe coronary artery disease.  He underwent CABG x5 in 2009 followed by PCI in 2017 requiring intervention.  He also had a squamous cell carcinoma of the base of his tongue she was treated with multiple rounds of external beam radiation.  Surveillance carotid duplex performed 04/11/2021 revealed a progression of disease with severe right internal carotid artery stenosis.  He was referred to the [facility] for further evaluation.  Patient reports no neurologic symptoms attributable to his carotid artery.  He specifically denies unilateral weakness or numbness, amaurosis, difficulty speaking or swallowing, facial droop.  05/02/21: Returns to review CT  angiogram.  No interval neurologic events.  Past Medical History:  Diagnosis Date   Anxiety    Arthritis    "NECK, HANDS, KNEES" (06/14/2016)   CAD (coronary artery disease)    Carotid artery stenosis    Complication of anesthesia    Reports that he had trouble swallowing post anesthesia- CABG   COPD (chronic obstructive pulmonary disease) (HCC) 12/11/2011   r/s mv - EF 72%; exercise capacity 13 METS; no exercised induced ischemic EKG changes   Depression, major    Dyspnea    with exertion   GERD (gastroesophageal reflux disease)    HTN (hypertension), benign 04/16/2008   echo - EF >55%; no regional wall or valvular abnormalities   Hyperlipidemia    statin intolerant   Hypothyroidism    Migraine    "crippling headaches as a kid; silent migraines now, no pain, couple times/week" (06/14/2016)   Prostate cancer ([place]) 04/2019   recent dx - Dr Lawerance Bach   S/P angioplasty with stent 06/14/16 PCI & DES of high grade AV groove LCX and 1st OM  06/15/2016   Squamous cell cancer of tongue ([place]) ~ 2002   "35 [facility] treatments at [facility]"   Subclavian steal syndrome 06/17/2012   carotid doppler - R systolic brachial pressure 146mHg, L 1597mg; R subclavian artery - proxmial obsstruction w/ abnormal monophasic waveforms, R ECA known occlusive disease; L ECA narrowing w/ 70-99% diameter reductiona   TIA (transient ischemic attack) 2005   Viral hepatitis 1970s   "non specific"    Past Surgical History:  Procedure Laterality Date   BACK SURGERY     BIOPSY  02/20/2018   Procedure: BIOPSY;  Surgeon: MaJuanita Craver  MD;  Location: [facility];  Service: Endoscopy;;   BIOPSY TONGUE  ~ 2002   "base of tongue and uvula"   CARDIAC CATHETERIZATION  04/26/2008   L circumflex 99% stenosed midportion straddling a marginal branch; RCA total priximally w/ grade 2 L to R collaterals; renal arteries widely patent; LAD 30% segmental proximal stenosis   CARDIAC CATHETERIZATION N/A 05/14/2016   Procedure:  Left Heart Cath and Cors/Grafts Angiography;  Surgeon: [name], MD;  Location: [facility];  Service: Cardiovascular;  Laterality: N/A;   CARDIAC CATHETERIZATION N/A 06/14/2016   Procedure: Coronary Stent Intervention;  Surgeon: [name], MD;  Location: [facility];  Service: Cardiovascular;  Laterality: N/A;   CERVICAL DISC SURGERY  2000   [place]   COLONOSCOPY WITH PROPOFOL N/A 02/20/2018   Procedure: COLONOSCOPY WITH PROPOFOL;  Surgeon: Juanita Craver, MD;  Location: [facility];  Service: Endoscopy;  Laterality: N/A;   CORONARY ANGIOPLASTY WITH STENT PLACEMENT     CORONARY ARTERY BYPASS GRAFT  2009   x5; LIMA to LAd, sequential vein to first diagonal branch, first and second obtuse marginal branches; vein to PDA   ESOPHAGOGASTRODUODENOSCOPY (EGD) WITH PROPOFOL N/A 02/20/2018   Procedure: ESOPHAGOGASTRODUODENOSCOPY (EGD) WITH PROPOFOL;  Surgeon: Juanita Craver, MD;  [facility];  Service: Endoscopy;  Laterality: N/A;   INSERTION OF MESH N/A 10/29/2017   Procedure: INSERTION OF MESH;  Surgeon: Donnie Mesa, MD;  Location: [facility];  Service: General;  Laterality: N/A;   LUMBAR [place] SURGERY  06/17/2019   LUMBAR 2 & 3    LUMBAR LAMINECTOMY/DECOMPRESSION MICRODISCECTOMY N/A 06/17/2019   Procedure: Lumbar two -Lumbar three decompression and disectomy;  Surgeon: Melina Schools, MD;  Location: [facility];  Service: Orthopedics;  Laterality: N/A;  3 hrs   POLYPECTOMY  02/20/2018   Procedure: POLYPECTOMY;  Surgeon: Juanita Craver, MD;  [facility];  Service: Endoscopy;;   SHOULDER ARTHROSCOPY WITH SUBACROMIAL DECOMPRESSION AND OPEN ROTATOR C Left 09/28/2013   Procedure: LEFT SHOULDER ARTHROSCOPY WITH SUBACROMIAL DECOMPRESSION AND MINI OPEN ROTATOR CUFF REPAIR, OPEN DISTAL CLAVICLE RESECTION AND OPEN BICEP TENDODESIS ;  Surgeon: Augustin Schooling, MD;  Location: [facility];  Service: Orthopedics;  Laterality: Left;   UMBILICAL HERNIA REPAIR N/A 10/29/2017   Procedure:  UMBILICAL HERNIA REPAIR;  Surgeon: Donnie Mesa, MD;  Location: [facility];  Service: General;  Laterality: N/A;    Family History  Problem Relation Age of Onset   Cancer Mother        ovarian   Stroke Father    Diabetes Father    Arthritis Father    Hypertension Brother     Social History   Socioeconomic History   Marital status: Married    Spouse name: Not on file   Number of children: Not on file   Years of education: Not on file   Highest education level: Not on file  Occupational History   Not on file  Tobacco Use   Smoking status: Former    Packs/day: 3.00    Years: 30.00    Pack years: 90.00    Types: Cigarettes    Quit date: 01/29/2001    Years since quitting: 20.2   Smokeless tobacco: Never  Vaping Use   Vaping Use: Never used  Substance and Sexual Activity   Alcohol use: Yes    Alcohol/week: 35.0 standard drinks    Types: 35 Cans of beer per week    Comment: 4-5 day beers   Drug use: No  Sexual activity: Not on file  Other Topics Concern   Not on file  Social History Narrative   Not on file   Social Determinants of Health   Financial Resource Strain: Not on file  Food Insecurity: Not on file  Transportation Needs: Not on file  Physical Activity: Not on file  Stress: Not on file  Social Connections: Not on file  Intimate Partner Violence: Not on file    Allergies  Allergen Reactions   Crestor [Rosuvastatin Calcium] Other (See Comments)    Difficulty swallowing   Tramadol Shortness Of Breath   Lisinopril Cough   Atorvastatin    Gabapentin Other (See Comments)    Hallucinations   Other    Tricor [Fenofibrate] Other (See Comments)    UNSPECIFIED REACTION   Lipitor [Atorvastatin Calcium] Other (See Comments)    Muscle spasms    Current Outpatient Medications  Medication Sig Dispense Refill   albuterol (VENTOLIN HFA) 108 (90 Base) MCG/ACT inhaler Inhale 2 puffs into the lungs every 6 (six) hours as needed for wheezing or shortness of breath.  18 g 5   Alirocumab (PRALUENT) 150 MG/ML SOAJ INJECT 150 MG INTO THE SKIN EVERY 14 (FOURTEEN) DAYS. 2 mL 11   amLODipine (NORVASC) 10 MG tablet TAKE 1 TABLET BY MOUTH DAILY 90 tablet 3   amphetamine-dextroamphetamine (ADDERALL) 20 MG tablet Take one tablet by mouth in the morning, one tab at noon and one tablet at 4pm. 270 tablet 0   desvenlafaxine (PRISTIQ) 50 MG 24 hr tablet Take 1 tablet by mouth every morning **Discontinue Buproprion** 90 tablet 0   Halcinonide (HALOG) 0.1 % CREA Apply 1 application topically as directed. 1.5 g 0   hydrochlorothiazide (MICROZIDE) 12.5 MG capsule TAKE 1 CAPSULE BY MOUTH DAILY 90 capsule 3   levothyroxine (SYNTHROID) 75 MCG tablet TAKE 1 TABLET BY MOUTH ONCE DAILY 90 tablet 0   lidocaine (XYLOCAINE) 5 % ointment Apply 1 application topically as needed. 35.44 g 0   LORazepam (ATIVAN) 1 MG tablet TAKE 1 TABLET FOUR TIMES DAILY AS NEEDED.  PER DOCTOR: MAKE APPT NOW! 120 tablet 1   pantoprazole (PROTONIX) 40 MG tablet TAKE 1 TABLET (40 MG TOTAL) BY MOUTH DAILY. 90 tablet 3   tadalafil (CIALIS) 5 MG tablet Take 1 tablet (5 mg total) by mouth daily. Take 3-5 times a week for penile rehabilitation. May take up to '20mg'$  as needed for intercourse. 30 tablet 2   Tetrahydrozoline HCl (VISINE OP) Place 2-3 drops into both eyes 2 (two) times daily as needed (for dry eyes).     traZODone (DESYREL) 50 MG tablet Take 1 - 3 tablets by mouth at bedtime as needed 90 tablet 1   Vilazodone HCl (VIIBRYD) 40 MG TABS Take 1/2 tablet by mouth daily with a full meal for 7 days then increase to 1 tablet daily 30 tablet 0   No current facility-administered medications for this visit.    REVIEW OF SYSTEMS:  '[X]'$  denotes positive finding, '[ ]'$  denotes negative finding Cardiac  Comments:  Chest pain or chest pressure:    Shortness of breath upon exertion:    Short of breath when lying flat:    Irregular heart rhythm:        Vascular    Pain in calf, thigh, or hip brought on by  ambulation:    Pain in feet at night that wakes you up from your sleep:     Blood clot in your veins:    Leg swelling:  Pulmonary    Oxygen at home:    Productive cough:     Wheezing:         Neurologic    Sudden weakness in arms or legs:     Sudden numbness in arms or legs:     Sudden onset of difficulty speaking or slurred speech:    Temporary loss of vision in one eye:     Problems with dizziness:         Gastrointestinal    Blood in stool:     Vomited blood:         Genitourinary    Burning when urinating:     Blood in urine:        Psychiatric    Major depression:         Hematologic    Bleeding problems:    Problems with blood clotting too easily:        Skin    Rashes or ulcers:        Constitutional    Fever or chills:      PHYSICAL EXAM Vitals:   05/02/21 1317  BP: (!) 176/71  Pulse: 61  Resp: 20  Temp: 98.1 F (36.7 C)  SpO2: 100%  Weight: 150 lb (68 kg)  Height: '5\' 7"'$  (1.702 m)   Constitutional: Thin, but well appearing. no distress. Appears marginally nourished.  Neurologic: CN intact. no focal findings. no sensory loss. Psychiatric:  Mood and affect symmetric and appropriate. Eyes:  No icterus. No conjunctival pallor. Ears, nose, throat: no external evidence of radiotherapy (e.g. "woody" skin). mucous membranes moist. Midline trachea.  Cardiac: regular rate and rhythm.  Respiratory:  unlabored. Abdominal:  soft, non-tender, non-distended.  Peripheral vascular: trace R radial pulse. 2+ L radial pulse. 2+ popliteal pulses. Extremity: no edema. no cyanosis. no pallor.  Skin: no gangrene. no ulceration.  Lymphatic: no Stemmer's sign. no palpable lymphadenopathy.  PERTINENT LABORATORY AND RADIOLOGIC DATA  Most recent CBC CBC Latest Ref Rng & Units 03/30/2021 03/18/2020 06/10/2019  WBC 3.8 - 10.8 Thousand/uL 3.2(L) 4.3 5.3  Hemoglobin 13.2 - 17.1 g/dL 12.9(L) 16.0 16.3  Hematocrit 38.5 - 50.0 % 38.0(L) 47.2 49.2  Platelets 140 - 400  Thousand/uL 174 192 223     Most recent CMP CMP Latest Ref Rng & Units 03/30/2021 03/18/2020 06/10/2019  Glucose 65 - 99 mg/dL 91 109(H) 171(H)  BUN 7 - 25 mg/dL '18 14 18  '$ Creatinine 0.70 - 1.25 mg/dL 1.09 1.19 1.37(H)  Sodium 135 - 146 mmol/L 143 143 138  Potassium 3.5 - 5.3 mmol/L 3.6 4.1 3.4(L)  Chloride 98 - 110 mmol/L 104 103 102  CO2 20 - 32 mmol/L '31 30 26  '$ Calcium 8.6 - 10.3 mg/dL 8.9 9.5 9.3  Total Protein 6.1 - 8.1 g/dL 5.7(L) 6.7 -  Total Bilirubin 0.2 - 1.2 mg/dL 0.6 0.5 -  Alkaline Phos 40 - 115 U/L - - -  AST 10 - 35 U/L 20 26 -  ALT 9 - 46 U/L 21 22 -    Renal function CrCl cannot be calculated (Patient's most recent lab result is older than the maximum 21 days allowed.).  Hgb A1c MFr Bld (%)  Date Value  01/01/2017 5.5    LDL Cholesterol (Calc)  Date Value Ref Range Status  03/30/2021 52 mg/dL (calc) Final    Comment:    Reference range: <100 . Desirable range <100 mg/dL for primary prevention;   <70 mg/dL for patients with CHD or diabetic patients  with > or = 2 CHD risk factors. Marland Kitchen LDL-C is now calculated using the Martin-Hopkins  calculation, which is a validated novel method providing  better accuracy than the Friedewald equation in the  estimation of LDL-C.  Cresenciano Genre et al. Annamaria Helling. [id number]): 2061-2068  (http://education.[facility].com/faq/FAQ164)      Carotid Arterial Duplex Study   Patient Name:  [name]  Date of Exam:   04/11/2021  Medical Rec #: [id number]        Accession #:    [id number]  Date of Birth: [birth year]         Patient Gender: M  Patient Age:   70Y  Exam Location:  [facility]  Procedure:      VAS US CAROTID  Referring Phys: [id number] JONATHAN J BERRY    ---------------------------------------------------------------------------  -----     Indications:                           Carotid artery disease, Speech                                         disturbance, Numbness and Patient  has had                                          left face numbness which occurs  with                                         positional changes. He also  indicates the                                         last few months having a hard time                                         articulating his words and will  notice                                         drooling.  Risk Factors:                          Hypertension, hyperlipidemia, past                                         history of smoking, coronary artery                                         disease.  Other Factors:                         Patient indicates he has  issues  with                                         statins and is presently not taking  any.  Comparison Study:                      Prior carotid exam on 04/12/2020  showed                                         highest velocities in right  proximal ICA                                         215/62 cm/s and left mid ICA 137/29  cm/s.                                         No mention of right subclavian  steal                                         syndrome.                                            Prior carotid duplex exam  04/28/2019                                         highest velocities in right  proximal ICA                                         256/74 cm/s and left mid ICA 247/61  cm/s.                                         Right subclavian steal syndrome  noted                                         with 30 mmHg brachial arm  difference.  Pre-Surgical Evaluation & Surgical     Stenosis at RICA only. ICA is not  normal  Correlation                            past stenosis. A kink/loop was  present in                                         the  Right ICA.Right bifurcation is                                         located near the [place].   Performing Technologist: Salvadore Dom RVT, [place] (AE), RDMS   Supporting Technologist: Leavy Cella RDCS       Examination Guidelines: A complete evaluation includes B-mode imaging,  spectral  Doppler, color Doppler, and power Doppler as needed of all accessible  portions  of each vessel. Bilateral testing is considered an integral part of a  complete  examination. Limited examinations for reoccurring indications may be  performed  as noted.      Right Carotid Findings:  +----------+--------+--------+--------+--------------------+---------------  ----+            PSV cm/sEDV cm/sStenosisPlaque Description  Comments              +----------+--------+--------+--------+--------------------+---------------  ----+  CCA Prox  71      11              homogeneous                               +----------+--------+--------+--------+--------------------+---------------  ----+  CCA Mid   42      9       <50%                                              +----------+--------+--------+--------+--------------------+---------------  ----+  CCA Distal40      16      <50%                                              +----------+--------+--------+--------+--------------------+---------------  ----+  ICA Prox  451     156     80-99%  heterogenous                              +----------+--------+--------+--------+--------------------+---------------  ----+  ICA Mid   48      14                                  tortuous              +----------+--------+--------+--------+--------------------+---------------  ----+  ICA Distal53      22                                                        +----------+--------+--------+--------+--------------------+---------------  ----+  ECA       80      17              calcific, focal and low resistance  flow  heterogenous                              +----------+--------+--------+--------+--------------------+---------------  ----+    +----------+--------+-------+---------+-------------------+            PSV cm/sEDV cmsDescribe Arm Pressure (mmHG)  +----------+--------+-------+---------+-------------------+  [id number]            [id number]                  +----------+--------+-------+---------+-------------------+   +---------+--------+--+--------+--+----------------------+  VertebralPSV cm/s25EDV cm/s10Antegrade and Atypical  +---------+--------+--+--------+--+----------------------+   Right brachial artery antecubital fossa 34 cm/s, monophasic   Left Carotid Findings:  +----------+--------+--------+--------+-------------------+----------------  ----+            PSV cm/sEDV cm/sStenosisPlaque Description Comments                +----------+--------+--------+--------+-------------------+----------------  ----+  CCA Prox  123     32                                                         +----------+--------+--------+--------+-------------------+----------------  ----+  CCA Mid   114     37      <50%    homogeneous                                +----------+--------+--------+--------+-------------------+----------------  ----+  CCA Distal92      27      <50%                                               +----------+--------+--------+--------+-------------------+----------------  ----+  ICA Prox  102     31              focal, calcific and.                                                        heterogenous                               +----------+--------+--------+--------+-------------------+----------------  ----+  ICA Mid   228     64      60-79%                     tortuous with                                                               tubulent flow  more  distal. Could  this                                                         be   fibromuscular                                                           dysplasia (FMD)  ? No                                                       evidence of  plaque                                                         noted                  +----------+--------+--------+--------+-------------------+----------------  ----+  ICA Distal165     44                                                         +----------+--------+--------+--------+-------------------+----------------  ----+  ECA                       Occluded                                           +----------+--------+--------+--------+-------------------+----------------  ----+   +----------+--------+--------+----------------+-------------------+            PSV cm/sEDV cm/sDescribe        Arm Pressure (mmHG)  +----------+--------+--------+----------------+-------------------+  [id number]             Multiphasic, [id number]                  +----------+--------+--------+----------------+-------------------+   +---------+--------+---+--------+--+---------+  VertebralPSV cm/s102EDV cm/s24Antegrade  +---------+--------+---+--------+--+---------+   Left brachial artery antecubital fossa 66 cm/s, triphasic          Findings reported to Dr. Eleonore Chiquito (DOD) and Dr. Kennon Holter email through  [facility] at 11:30 am Patient being referred to [facility] and was instructed about  stroke symptoms and sent home. .   Summary:  Right Carotid: Velocities in the right ICA are consistent with a 80-99%                 stenosis. Non-hemodynamically significant plaque <50% noted  in                 the CCA.   Left Carotid: Velocities in the left ICA are consistent with a 60-79%  stenosis.  Mid to distal ICA has the appearance of possible  fibromuscular                dysplasia (FMD) with no evidence of plaque seen.                Non-hemodynamically significant plaque  <50% noted in the  CCA. The                ECA now appears occluded.   Vertebrals:  Bilateral vertebral arteries demonstrate antegrade flow.  Right               vertebral has abnormal flow.  Subclavians: Right subclavian artery flow was disturbed. Normal flow               hemodynamics were seen in the left subclavian artery. There  is 33               mmHg difference in brachial arterial pressures, right is  lower than               left. Right moderate subclavian steal syndrome.   *See table(s) above for measurements and observations.       Electronically signed by Carlyle Dolly MD on 04/13/2021 at 11:02:48 AM.  CLINICAL DATA:  Bilateral carotid artery stenosis I65.23 (ICD-10-CM). Carotid stenosis screening, no risk factors.   EXAM: CT ANGIOGRAPHY HEAD AND NECK   TECHNIQUE: Multidetector CT imaging of the head and neck was performed using the standard protocol during bolus administration of intravenous contrast. Multiplanar CT image reconstructions and MIPs were obtained to evaluate the vascular anatomy. Carotid stenosis measurements (when applicable) are obtained utilizing NASCET criteria, using the distal internal carotid diameter as the denominator.   CONTRAST:  51m OMNIPAQUE IOHEXOL 350 MG/ML SOLN   COMPARISON:  MRI of the brain July 28, 2020. MR angiogram of the head and neck January 02, 2010.   FINDINGS: CT HEAD FINDINGS   Brain: No evidence of acute infarction, hemorrhage, hydrocephalus, extra-axial collection or mass lesion/mass effect.   Vascular: Calcified plaques in the bilateral carotid siphons. No hyperdense sign.   Skull: Negative for fracture or focal lesion. Degenerative changes of the left temporomandibular joint.   Sinuses: Imaged portions are clear.   Orbits: No acute finding.   Review of the MIP images confirms the above findings   CTA NECK FINDINGS   Aortic arch: Common origin of the innominate and left common carotid artery from  the aortic arch. Imaged portion shows no evidence of aneurysm or dissection. Atherosclerotic changes with calcified plaques without significant stenosis at the major neck arteries. Postsurgical changes from prior coronary bypass.   Right carotid system: Atherosclerotic changes along the right common carotid artery with ulcerated plaques. No hemodynamically significant stenosis. Atherosclerotic changes of the right carotid bifurcation with mixed density plaque resulting in 70% stenosis at the origin of the right ICA.   Left carotid system: Atherosclerotic changes along the left common carotid artery with ulcerated plaques. There is approximately 50% stenosis at its origin and at the mid segment. Atherosclerotic changes at the left carotid bifurcation with mixed density plaque without hemodynamically significant stenosis.   Vertebral arteries: Atherosclerotic changes at the proximal of the right subclavian artery resulting approximately 50% stenosis. There also atherosclerotic changes at the origin of the right vertebral artery resulting in mild stenosis. There is moderate stenosis of the right vertebral artery at the C6-7 level related to osteophyte. Occlusion of the right vertebral artery at the dural margin with  patent diminutive right PICA. Mild atherosclerotic changes of the left subclavian artery without hemodynamically significant stenosis. There also mild atherosclerotic changes at the origin of the left vertebral artery without hemodynamically significant stenosis. The dominant left vertebral artery is patent throughout its entire course with scattered atherosclerotic changes without hemodynamically significant stenosis.   Skeleton: Degenerative changes of the cervical spine left temporomandibular joint. Postsurgical changes from prior sternotomy. No acute findings.   Other neck: No acute findings.   Upper chest: Mild biapical pleural pulmonary scarring.   Review of the  MIP images confirms the above findings   CTA HEAD FINDINGS   Anterior circulation: Calcified atherosclerotic plaques in the bilateral carotid siphons resulting in mild-to-moderate stenosis at the right distal cavernous segment. Hypoplastic right A1 and A2 segments with a dominant bihemispheric left A2 segment. The bilateral ACA and MCA vascular trees are otherwise unremarkable.   Posterior circulation: Occluded intracranial right vertebral artery. Normal course and caliber of the intracranial left vertebral artery, basilar artery and bilateral posterior cerebral arteries   Venous sinuses: As permitted by contrast timing, patent.   Anatomic variants: As described above.   Review of the MIP images confirms the above findings   IMPRESSION: 1. Approximately 70% stenosis at the right carotid bifurcation. 2. Atherosclerotic changes of the right common carotid artery ulcerated plaques without hemodynamically significant stenosis. 3. Atherosclerotic changes of the left common carotid artery with ulcerated plaques in approximately 50% stenosis at the mid segment. 4. Atherosclerotic changes of the left carotid bifurcation without hemodynamically significant stenosis. 5. 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 6. Mild-to-moderate stenosis of the distal cavernous segment of the right ICA.     Electronically Signed   By: Pedro Earls M.D.   On: 05/01/2021 15:10  Yevonne Aline. Stanford Breed, MD [facility] Phone Number: [phone] 05/01/2021 12:13 PM

## 2021-05-02 ENCOUNTER — Encounter: Payer: Self-pay | Admitting: Vascular Surgery

## 2021-05-02 ENCOUNTER — Ambulatory Visit (INDEPENDENT_AMBULATORY_CARE_PROVIDER_SITE_OTHER): Payer: 59 | Admitting: Vascular Surgery

## 2021-05-02 ENCOUNTER — Other Ambulatory Visit: Payer: Self-pay

## 2021-05-02 ENCOUNTER — Other Ambulatory Visit (HOSPITAL_BASED_OUTPATIENT_CLINIC_OR_DEPARTMENT_OTHER): Payer: Self-pay

## 2021-05-02 VITALS — BP 176/71 | HR 61 | Temp 98.1°F | Resp 20 | Ht 67.0 in | Wt 150.0 lb

## 2021-05-02 DIAGNOSIS — I6523 Occlusion and stenosis of bilateral carotid arteries: Secondary | ICD-10-CM | POA: Diagnosis not present

## 2021-05-02 MED ORDER — TICAGRELOR 90 MG PO TABS
90.0000 mg | ORAL_TABLET | Freq: Two times a day (BID) | ORAL | 0 refills | Status: AC
  Filled 2021-05-02: qty 60, 30d supply, fill #0

## 2021-05-02 MED ORDER — TICAGRELOR 60 MG PO TABS: 90.0000 mg | ORAL_TABLET | Freq: Two times a day (BID) | ORAL | Status: AC

## 2021-05-03 ENCOUNTER — Encounter: Payer: Self-pay | Admitting: Internal Medicine

## 2021-05-03 ENCOUNTER — Other Ambulatory Visit: Payer: Self-pay

## 2021-05-03 NOTE — Patient Instructions (Addendum)
It was a pleasure to see you today.  Take B12 supplement over-the-counter and follow-up in late August.  Otherwise continue current medications.  Consider COVID booster in the Fall.  Tetanus immunization is up-to-date.  Need to have second Shingrix vaccine.  Consider pneumococcal vaccine.

## 2021-05-04 ENCOUNTER — Encounter (HOSPITAL_COMMUNITY): Payer: Self-pay | Admitting: Vascular Surgery

## 2021-05-04 ENCOUNTER — Other Ambulatory Visit: Payer: Self-pay

## 2021-05-04 LAB — SARS CORONAVIRUS 2 (TAT 6-24 HRS): SARS Coronavirus 2: NEGATIVE

## 2021-05-04 NOTE — Progress Notes (Addendum)
Mr. [name] denies chest pain or shortness of breath. Patient denies having any s/s of Covid in his household.  Patient denies any known exposure to Covid. Mr. [name] was tested for Covid on 05/03/21. I instructed patient to shower with antibiotic soap, if it is available.  Dry off with a clean towel. Do not put lotion, powder, cologne or deodorant or makeup.No jewelry or piercings. Men may shave their face and neck. Woman should not shave. No nail polish, artificial or acrylic nails. Wear clean clothes, brush your teeth. Glasses, contact lens,dentures or partials may not be worn in the OR. If you need to wear them, please bring a case for glasses, do not wear contacts or bring a case, the hospital does not have contact cases, dentures or partials will have to be removed , make sure they are clean, we will provide a denture cup to put them in.

## 2021-05-05 ENCOUNTER — Encounter (HOSPITAL_COMMUNITY): Payer: Self-pay | Admitting: Vascular Surgery

## 2021-05-05 ENCOUNTER — Inpatient Hospital Stay (HOSPITAL_COMMUNITY): Payer: 59 | Admitting: Certified Registered"

## 2021-05-05 ENCOUNTER — Encounter (HOSPITAL_COMMUNITY)
Admission: RE | Disposition: A | Discharge status: Home / Self Care | Payer: Self-pay | Source: Home / Self Care | Attending: Vascular Surgery

## 2021-05-05 ENCOUNTER — Inpatient Hospital Stay (HOSPITAL_COMMUNITY): Payer: 59

## 2021-05-05 ENCOUNTER — Inpatient Hospital Stay (HOSPITAL_COMMUNITY)
Admission: RE | Admit: 2021-05-05 | Discharge: 2021-05-06 | DRG: 036 | Disposition: A | Discharge status: Home / Self Care | Payer: 59 | Attending: Vascular Surgery | Admitting: Vascular Surgery

## 2021-05-05 ENCOUNTER — Other Ambulatory Visit: Payer: Self-pay

## 2021-05-05 DIAGNOSIS — I739 Peripheral vascular disease, unspecified: Secondary | ICD-10-CM | POA: Diagnosis not present

## 2021-05-05 DIAGNOSIS — Z85810 Personal history of malignant neoplasm of tongue: Secondary | ICD-10-CM | POA: Diagnosis not present

## 2021-05-05 DIAGNOSIS — F419 Anxiety disorder, unspecified: Secondary | ICD-10-CM | POA: Diagnosis present

## 2021-05-05 DIAGNOSIS — E039 Hypothyroidism, unspecified: Secondary | ICD-10-CM | POA: Diagnosis not present

## 2021-05-05 DIAGNOSIS — Z951 Presence of aortocoronary bypass graft: Secondary | ICD-10-CM | POA: Diagnosis not present

## 2021-05-05 DIAGNOSIS — J449 Chronic obstructive pulmonary disease, unspecified: Secondary | ICD-10-CM | POA: Diagnosis not present

## 2021-05-05 DIAGNOSIS — Z955 Presence of coronary angioplasty implant and graft: Secondary | ICD-10-CM | POA: Diagnosis not present

## 2021-05-05 DIAGNOSIS — Z87891 Personal history of nicotine dependence: Secondary | ICD-10-CM | POA: Diagnosis not present

## 2021-05-05 DIAGNOSIS — F329 Major depressive disorder, single episode, unspecified: Secondary | ICD-10-CM | POA: Diagnosis not present

## 2021-05-05 DIAGNOSIS — I10 Essential (primary) hypertension: Secondary | ICD-10-CM | POA: Diagnosis not present

## 2021-05-05 DIAGNOSIS — I6521 Occlusion and stenosis of right carotid artery: Secondary | ICD-10-CM | POA: Diagnosis not present

## 2021-05-05 DIAGNOSIS — Z8546 Personal history of malignant neoplasm of prostate: Secondary | ICD-10-CM

## 2021-05-05 DIAGNOSIS — K219 Gastro-esophageal reflux disease without esophagitis: Secondary | ICD-10-CM | POA: Diagnosis present

## 2021-05-05 DIAGNOSIS — I2510 Atherosclerotic heart disease of native coronary artery without angina pectoris: Secondary | ICD-10-CM | POA: Diagnosis not present

## 2021-05-05 DIAGNOSIS — E785 Hyperlipidemia, unspecified: Secondary | ICD-10-CM | POA: Diagnosis present

## 2021-05-05 DIAGNOSIS — Z888 Allergy status to other drugs, medicaments and biological substances status: Secondary | ICD-10-CM

## 2021-05-05 DIAGNOSIS — Z8673 Personal history of transient ischemic attack (TIA), and cerebral infarction without residual deficits: Secondary | ICD-10-CM

## 2021-05-05 DIAGNOSIS — Z20822 Contact with and (suspected) exposure to covid-19: Secondary | ICD-10-CM | POA: Diagnosis present

## 2021-05-05 HISTORY — DX: Attention-deficit hyperactivity disorder, unspecified type: F90.9

## 2021-05-05 HISTORY — DX: Peripheral vascular disease, unspecified: I73.9

## 2021-05-05 HISTORY — PX: TRANSCAROTID ARTERY REVASCULARIZATIONÂ: SHX6778

## 2021-05-05 LAB — CBC
HCT: 38.8 % — ABNORMAL LOW (ref 39.0–52.0)
Hemoglobin: 13.8 g/dL (ref 13.0–17.0)
MCH: 32.1 pg (ref 26.0–34.0)
MCHC: 35.6 g/dL (ref 30.0–36.0)
MCV: 90.2 fL (ref 80.0–100.0)
Platelets: 167 10*3/uL (ref 150–400)
RBC: 4.3 MIL/uL (ref 4.22–5.81)
RDW: 13.1 % (ref 11.5–15.5)
WBC: 3.8 10*3/uL — ABNORMAL LOW (ref 4.0–10.5)
nRBC: 0 % (ref 0.0–0.2)

## 2021-05-05 LAB — COMPREHENSIVE METABOLIC PANEL
ALT: 28 U/L (ref 0–44)
AST: 29 U/L (ref 15–41)
Albumin: 3.6 g/dL (ref 3.5–5.0)
Alkaline Phosphatase: 57 U/L (ref 38–126)
Anion gap: 9 (ref 5–15)
BUN: 18 mg/dL (ref 8–23)
CO2: 26 mmol/L (ref 22–32)
Calcium: 9 mg/dL (ref 8.9–10.3)
Chloride: 107 mmol/L (ref 98–111)
Creatinine, Ser: 1.17 mg/dL (ref 0.61–1.24)
GFR, Estimated: >60 mL/min (ref >60)
Glucose, Bld: 116 mg/dL — ABNORMAL HIGH (ref 70–99)
Potassium: 3.3 mmol/L — ABNORMAL LOW (ref 3.5–5.1)
Sodium: 142 mmol/L (ref 135–145)
Total Bilirubin: 0.8 mg/dL (ref 0.3–1.2)
Total Protein: 6.1 g/dL — ABNORMAL LOW (ref 6.5–8.1)

## 2021-05-05 LAB — URINALYSIS, ROUTINE W REFLEX MICROSCOPIC
Bilirubin Urine: NEGATIVE
Glucose, UA: NEGATIVE mg/dL
Hgb urine dipstick: NEGATIVE
Ketones, ur: NEGATIVE mg/dL
Leukocytes,Ua: NEGATIVE
Nitrite: NEGATIVE
Protein, ur: NEGATIVE mg/dL
Specific Gravity, Urine: 1.01 (ref 1.005–1.030)
pH: 6 (ref 5.0–8.0)

## 2021-05-05 LAB — APTT: aPTT: 26 seconds (ref 24–36)

## 2021-05-05 LAB — PROTIME-INR
INR: 1 (ref 0.8–1.2)
Prothrombin Time: 13 seconds (ref 11.4–15.2)

## 2021-05-05 LAB — TYPE AND SCREEN
ABO/RH(D): O POS
Antibody Screen: NEGATIVE

## 2021-05-05 LAB — SURGICAL PCR SCREEN
MRSA, PCR: NEGATIVE
Staphylococcus aureus: NEGATIVE

## 2021-05-05 LAB — POCT ACTIVATED CLOTTING TIME: Activated Clotting Time: 277 seconds

## 2021-05-05 SURGERY — TRANSCAROTID ARTERY REVASCULARIZATION (TCAR)
Anesthesia: General | Site: Neck | Laterality: Right

## 2021-05-05 MED ORDER — POLYETHYLENE GLYCOL 3350 17 G PO PACK: 17.0000 g | PACK | Freq: Every day | ORAL | Status: DC | PRN

## 2021-05-05 MED ORDER — ACETAMINOPHEN 10 MG/ML IV SOLN
INTRAVENOUS | Status: AC
  Administered 2021-05-05: 1000 mg via INTRAVENOUS
  Filled 2021-05-05: qty 100

## 2021-05-05 MED ORDER — CEFAZOLIN SODIUM-DEXTROSE 2-4 GM/100ML-% IV SOLN
2.0000 g | INTRAVENOUS | Status: AC
  Administered 2021-05-05: 2 g via INTRAVENOUS
  Filled 2021-05-05: qty 100

## 2021-05-05 MED ORDER — HYDRALAZINE HCL 20 MG/ML IJ SOLN: 5.0000 mg | INTRAMUSCULAR | Status: DC | PRN

## 2021-05-05 MED ORDER — LIDOCAINE HCL (PF) 1 % IJ SOLN
INTRAMUSCULAR | Status: AC
  Filled 2021-05-05: qty 5

## 2021-05-05 MED ORDER — POTASSIUM CHLORIDE CRYS ER 20 MEQ PO TBCR: 20.0000 meq | EXTENDED_RELEASE_TABLET | Freq: Every day | ORAL | Status: DC | PRN

## 2021-05-05 MED ORDER — PROPOFOL 10 MG/ML IV BOLUS
INTRAVENOUS | Status: DC | PRN
  Administered 2021-05-05: 120 mg via INTRAVENOUS

## 2021-05-05 MED ORDER — PANTOPRAZOLE SODIUM 40 MG PO TBEC
40.0000 mg | DELAYED_RELEASE_TABLET | Freq: Every day | ORAL | Status: DC
  Administered 2021-05-06: 40 mg via ORAL
  Filled 2021-05-05: qty 1

## 2021-05-05 MED ORDER — 0.9 % SODIUM CHLORIDE (POUR BTL) OPTIME
TOPICAL | Status: DC | PRN
  Administered 2021-05-05: 1000 mL

## 2021-05-05 MED ORDER — PROTAMINE SULFATE 10 MG/ML IV SOLN
INTRAVENOUS | Status: AC
  Filled 2021-05-05: qty 5

## 2021-05-05 MED ORDER — ROCURONIUM BROMIDE 10 MG/ML (PF) SYRINGE
PREFILLED_SYRINGE | INTRAVENOUS | Status: AC
  Filled 2021-05-05: qty 10

## 2021-05-05 MED ORDER — LABETALOL HCL 5 MG/ML IV SOLN
10.0000 mg | INTRAVENOUS | Status: DC | PRN
Start: 2021-05-05 — End: 2021-05-06

## 2021-05-05 MED ORDER — GLYCOPYRROLATE PF 0.2 MG/ML IJ SOSY
PREFILLED_SYRINGE | INTRAMUSCULAR | Status: DC | PRN
  Administered 2021-05-05 (×3): .2 mg via INTRAVENOUS

## 2021-05-05 MED ORDER — OXYCODONE HCL 5 MG PO TABS: 5.0000 mg | ORAL_TABLET | Freq: Once | ORAL | Status: AC | PRN

## 2021-05-05 MED ORDER — SODIUM CHLORIDE 0.9 % IV SOLN: INTRAVENOUS | Status: DC

## 2021-05-05 MED ORDER — DOCUSATE SODIUM 100 MG PO CAPS
100.0000 mg | ORAL_CAPSULE | Freq: Every day | ORAL | Status: DC
  Filled 2021-05-05: qty 1

## 2021-05-05 MED ORDER — FENTANYL CITRATE (PF) 100 MCG/2ML IJ SOLN
25.0000 ug | INTRAMUSCULAR | Status: DC | PRN
  Administered 2021-05-05: 50 ug via INTRAVENOUS

## 2021-05-05 MED ORDER — LACTATED RINGERS IV SOLN: INTRAVENOUS | Status: DC

## 2021-05-05 MED ORDER — LIDOCAINE 2% (20 MG/ML) 5 ML SYRINGE
INTRAMUSCULAR | Status: DC | PRN
  Administered 2021-05-05: 50 mg via INTRAVENOUS

## 2021-05-05 MED ORDER — PHENOL 1.4 % MT LIQD: 1.0000 | OROMUCOSAL | Status: DC | PRN

## 2021-05-05 MED ORDER — ONDANSETRON HCL 4 MG/2ML IJ SOLN: 4.0000 mg | Freq: Four times a day (QID) | INTRAMUSCULAR | Status: DC | PRN

## 2021-05-05 MED ORDER — MORPHINE SULFATE (PF) 2 MG/ML IV SOLN
2.0000 mg | INTRAVENOUS | Status: DC | PRN
Start: 2021-05-05 — End: 2021-05-06

## 2021-05-05 MED ORDER — OXYCODONE-ACETAMINOPHEN 5-325 MG PO TABS: 1.0000 | ORAL_TABLET | ORAL | Status: DC | PRN

## 2021-05-05 MED ORDER — GLYCOPYRROLATE PF 0.2 MG/ML IJ SOSY
PREFILLED_SYRINGE | INTRAMUSCULAR | Status: AC
  Filled 2021-05-05: qty 1

## 2021-05-05 MED ORDER — LORAZEPAM 1 MG PO TABS
1.0000 mg | ORAL_TABLET | Freq: Four times a day (QID) | ORAL | Status: DC | PRN
  Administered 2021-05-05: 1 mg via ORAL
  Filled 2021-05-05: qty 1

## 2021-05-05 MED ORDER — PROPOFOL 10 MG/ML IV BOLUS
INTRAVENOUS | Status: AC
  Filled 2021-05-05: qty 20

## 2021-05-05 MED ORDER — CHLORHEXIDINE GLUCONATE CLOTH 2 % EX PADS: 6.0000 | MEDICATED_PAD | Freq: Once | CUTANEOUS | Status: DC

## 2021-05-05 MED ORDER — ALBUTEROL SULFATE (2.5 MG/3ML) 0.083% IN NEBU: 3.0000 mL | INHALATION_SOLUTION | Freq: Four times a day (QID) | RESPIRATORY_TRACT | Status: DC | PRN

## 2021-05-05 MED ORDER — ACETAMINOPHEN 650 MG RE SUPP: 325.0000 mg | RECTAL | Status: DC | PRN

## 2021-05-05 MED ORDER — ASPIRIN EC 81 MG PO TBEC
81.0000 mg | DELAYED_RELEASE_TABLET | Freq: Every day | ORAL | Status: DC
  Administered 2021-05-06: 81 mg via ORAL
  Filled 2021-05-05: qty 1

## 2021-05-05 MED ORDER — EPHEDRINE 5 MG/ML INJ
INTRAVENOUS | Status: AC
  Filled 2021-05-05: qty 5

## 2021-05-05 MED ORDER — IODIXANOL 320 MG/ML IV SOLN
INTRAVENOUS | Status: DC | PRN
  Administered 2021-05-05: 23 mL

## 2021-05-05 MED ORDER — HEPARIN SODIUM (PORCINE) 1000 UNIT/ML IJ SOLN
INTRAMUSCULAR | Status: DC | PRN
  Administered 2021-05-05: 7000 [IU] via INTRAVENOUS

## 2021-05-05 MED ORDER — LEVOTHYROXINE SODIUM 75 MCG PO TABS
75.0000 ug | ORAL_TABLET | Freq: Every day | ORAL | Status: DC
  Administered 2021-05-06: 75 ug via ORAL
  Filled 2021-05-05: qty 1

## 2021-05-05 MED ORDER — LACTATED RINGERS IV SOLN
INTRAVENOUS | Status: DC | PRN
Start: 2021-05-05 — End: 2021-05-05

## 2021-05-05 MED ORDER — SODIUM CHLORIDE 0.9 % IV SOLN: 500.0000 mL | Freq: Once | INTRAVENOUS | Status: DC | PRN

## 2021-05-05 MED ORDER — ONDANSETRON HCL 4 MG/2ML IJ SOLN
INTRAMUSCULAR | Status: AC
  Filled 2021-05-05: qty 2

## 2021-05-05 MED ORDER — AMLODIPINE BESYLATE 10 MG PO TABS
10.0000 mg | ORAL_TABLET | Freq: Every day | ORAL | Status: DC
  Administered 2021-05-06: 10 mg via ORAL
  Filled 2021-05-05: qty 1

## 2021-05-05 MED ORDER — ONDANSETRON HCL 4 MG/2ML IJ SOLN
4.0000 mg | Freq: Four times a day (QID) | INTRAMUSCULAR | Status: DC | PRN
Start: 2021-05-05 — End: 2021-05-06

## 2021-05-05 MED ORDER — LIDOCAINE 2% (20 MG/ML) 5 ML SYRINGE
INTRAMUSCULAR | Status: AC
  Filled 2021-05-05: qty 5

## 2021-05-05 MED ORDER — MAGNESIUM SULFATE 2 GM/50ML IV SOLN
2.0000 g | Freq: Every day | INTRAVENOUS | Status: DC | PRN
Start: 2021-05-05 — End: 2021-05-06

## 2021-05-05 MED ORDER — PHENYLEPHRINE HCL-NACL 20-0.9 MG/250ML-% IV SOLN
INTRAVENOUS | Status: DC | PRN
  Administered 2021-05-05: 25 ug/min via INTRAVENOUS

## 2021-05-05 MED ORDER — PHENYLEPHRINE 40 MCG/ML (10ML) SYRINGE FOR IV PUSH (FOR BLOOD PRESSURE SUPPORT)
PREFILLED_SYRINGE | INTRAVENOUS | Status: DC | PRN
  Administered 2021-05-05: 4 ug via INTRAVENOUS

## 2021-05-05 MED ORDER — OXYCODONE HCL 5 MG PO TABS
ORAL_TABLET | ORAL | Status: AC
  Administered 2021-05-05: 5 mg via ORAL
  Filled 2021-05-05: qty 1

## 2021-05-05 MED ORDER — SUGAMMADEX SODIUM 200 MG/2ML IV SOLN
INTRAVENOUS | Status: DC | PRN
  Administered 2021-05-05 (×2): 100 mg via INTRAVENOUS

## 2021-05-05 MED ORDER — ACETAMINOPHEN 325 MG PO TABS
325.0000 mg | ORAL_TABLET | ORAL | Status: DC | PRN
  Administered 2021-05-05: 650 mg via ORAL
  Filled 2021-05-05: qty 2

## 2021-05-05 MED ORDER — ALUM & MAG HYDROXIDE-SIMETH 200-200-20 MG/5ML PO SUSP: 15.0000 mL | ORAL | Status: DC | PRN

## 2021-05-05 MED ORDER — CEFAZOLIN SODIUM-DEXTROSE 2-4 GM/100ML-% IV SOLN
2.0000 g | Freq: Three times a day (TID) | INTRAVENOUS | Status: AC
  Administered 2021-05-05 – 2021-05-06 (×2): 2 g via INTRAVENOUS
  Filled 2021-05-05 (×3): qty 100

## 2021-05-05 MED ORDER — FENTANYL CITRATE (PF) 100 MCG/2ML IJ SOLN
INTRAMUSCULAR | Status: AC
  Administered 2021-05-05: 50 ug via INTRAVENOUS
  Filled 2021-05-05: qty 2

## 2021-05-05 MED ORDER — OXYCODONE HCL 5 MG/5ML PO SOLN: 5.0000 mg | Freq: Once | ORAL | Status: AC | PRN

## 2021-05-05 MED ORDER — FENTANYL CITRATE (PF) 250 MCG/5ML IJ SOLN
INTRAMUSCULAR | Status: AC
  Filled 2021-05-05: qty 5

## 2021-05-05 MED ORDER — PHENYLEPHRINE 40 MCG/ML (10ML) SYRINGE FOR IV PUSH (FOR BLOOD PRESSURE SUPPORT)
PREFILLED_SYRINGE | INTRAVENOUS | Status: AC
  Filled 2021-05-05: qty 10

## 2021-05-05 MED ORDER — EPHEDRINE SULFATE-NACL 50-0.9 MG/10ML-% IV SOSY
PREFILLED_SYRINGE | INTRAVENOUS | Status: DC | PRN
  Administered 2021-05-05: 10 mg via INTRAVENOUS

## 2021-05-05 MED ORDER — GUAIFENESIN-DM 100-10 MG/5ML PO SYRP: 15.0000 mL | ORAL_SOLUTION | ORAL | Status: DC | PRN

## 2021-05-05 MED ORDER — DEXAMETHASONE SODIUM PHOSPHATE 10 MG/ML IJ SOLN
INTRAMUSCULAR | Status: AC
  Filled 2021-05-05: qty 1

## 2021-05-05 MED ORDER — HYDROCHLOROTHIAZIDE 12.5 MG PO CAPS
12.5000 mg | ORAL_CAPSULE | Freq: Every day | ORAL | Status: DC
  Administered 2021-05-06: 12.5 mg via ORAL
  Filled 2021-05-05: qty 1

## 2021-05-05 MED ORDER — PROTAMINE SULFATE 10 MG/ML IV SOLN
INTRAVENOUS | Status: DC | PRN
  Administered 2021-05-05: 50 mg via INTRAVENOUS

## 2021-05-05 MED ORDER — VILAZODONE HCL 40 MG PO TABS: 20.0000 mg | ORAL_TABLET | Freq: Every day | ORAL | Status: DC

## 2021-05-05 MED ORDER — ACETAMINOPHEN 10 MG/ML IV SOLN: 1000.0000 mg | Freq: Once | INTRAVENOUS | Status: AC

## 2021-05-05 MED ORDER — CHLORHEXIDINE GLUCONATE 0.12 % MT SOLN
15.0000 mL | Freq: Once | OROMUCOSAL | Status: AC
  Administered 2021-05-05: 15 mL via OROMUCOSAL
  Filled 2021-05-05: qty 15

## 2021-05-05 MED ORDER — BISACODYL 5 MG PO TBEC: 5.0000 mg | DELAYED_RELEASE_TABLET | Freq: Every day | ORAL | Status: DC | PRN

## 2021-05-05 MED ORDER — HEPARIN 6000 UNIT IRRIGATION SOLUTION
Status: DC | PRN
  Administered 2021-05-05: 1

## 2021-05-05 MED ORDER — ROCURONIUM BROMIDE 10 MG/ML (PF) SYRINGE
PREFILLED_SYRINGE | INTRAVENOUS | Status: DC | PRN
  Administered 2021-05-05: 70 mg via INTRAVENOUS

## 2021-05-05 MED ORDER — HEPARIN 6000 UNIT IRRIGATION SOLUTION
Status: AC
  Filled 2021-05-05: qty 500

## 2021-05-05 MED ORDER — FENTANYL CITRATE (PF) 100 MCG/2ML IJ SOLN
INTRAMUSCULAR | Status: DC | PRN
  Administered 2021-05-05: 50 ug via INTRAVENOUS
  Administered 2021-05-05: 100 ug via INTRAVENOUS

## 2021-05-05 MED ORDER — ONDANSETRON HCL 4 MG/2ML IJ SOLN
INTRAMUSCULAR | Status: DC | PRN
Start: 2021-05-05 — End: 2021-05-05
  Administered 2021-05-05: 4 mg via INTRAVENOUS

## 2021-05-05 MED ORDER — METOPROLOL TARTRATE 5 MG/5ML IV SOLN: 2.0000 mg | INTRAVENOUS | Status: DC | PRN

## 2021-05-05 MED ORDER — DEXAMETHASONE SODIUM PHOSPHATE 10 MG/ML IJ SOLN
INTRAMUSCULAR | Status: DC | PRN
  Administered 2021-05-05: 8 mg via INTRAVENOUS

## 2021-05-05 MED ORDER — ORAL CARE MOUTH RINSE: 15.0000 mL | Freq: Once | OROMUCOSAL | Status: AC

## 2021-05-05 MED ORDER — TICAGRELOR 90 MG PO TABS
90.0000 mg | ORAL_TABLET | Freq: Two times a day (BID) | ORAL | Status: DC
  Administered 2021-05-06: 90 mg via ORAL
  Filled 2021-05-05: qty 1

## 2021-05-05 SURGICAL SUPPLY — 58 items
ADH SKN CLS APL DERMABOND .7 (GAUZE/BANDAGES/DRESSINGS) ×2
APL PRP STRL LF DISP 70% ISPRP (MISCELLANEOUS) ×1
APL SKNCLS STERI-STRIP NONHPOA (GAUZE/BANDAGES/DRESSINGS) ×1
BAG BANDED W/RUBBER/TAPE 36X54 (MISCELLANEOUS) ×2 IMPLANT
BAG EQP BAND 135X91 W/RBR TAPE (MISCELLANEOUS) ×1
BALLN STERLING RX 5X40X80 (BALLOONS) ×2
BALLOON STERLING RX 5X40X80 (BALLOONS) IMPLANT
BENZOIN TINCTURE PRP APPL 2/3 (GAUZE/BANDAGES/DRESSINGS) ×2 IMPLANT
BLADE MINI RND TIP GREEN BEAV (BLADE) IMPLANT
CANISTER SUCT 3000ML PPV (MISCELLANEOUS) ×2 IMPLANT
CHLORAPREP W/TINT 26 (MISCELLANEOUS) ×2 IMPLANT
CLIP VESOCCLUDE MED 6/CT (CLIP) ×2 IMPLANT
CLIP VESOCCLUDE SM WIDE 6/CT (CLIP) ×2 IMPLANT
COVER DOME SNAP 22 D (MISCELLANEOUS) ×2 IMPLANT
COVER PROBE W GEL 5X96 (DRAPES) ×2 IMPLANT
DERMABOND ADVANCED (GAUZE/BANDAGES/DRESSINGS) ×2
DERMABOND ADVANCED .7 DNX12 (GAUZE/BANDAGES/DRESSINGS) IMPLANT
DRAPE FEMORAL ANGIO 80X135IN (DRAPES) ×2 IMPLANT
ELECT REM PT RETURN 9FT ADLT (ELECTROSURGICAL) ×2
ELECTRODE REM PT RTRN 9FT ADLT (ELECTROSURGICAL) ×1 IMPLANT
GAUZE SPONGE 4X4 12PLY STRL (GAUZE/BANDAGES/DRESSINGS) ×2 IMPLANT
GLOVE SURG POLYISO LF SZ8 (GLOVE) ×2 IMPLANT
GOWN STRL REUS W/ TWL LRG LVL3 (GOWN DISPOSABLE) ×2 IMPLANT
GOWN STRL REUS W/ TWL XL LVL3 (GOWN DISPOSABLE) ×1 IMPLANT
GOWN STRL REUS W/TWL LRG LVL3 (GOWN DISPOSABLE) ×2
GOWN STRL REUS W/TWL XL LVL3 (GOWN DISPOSABLE) ×4
GUIDEWIRE ENROUTE 0.014 (WIRE) ×2 IMPLANT
INTRODUCER KIT GALT 7CM (INTRODUCER) ×2
KIT BASIN OR (CUSTOM PROCEDURE TRAY) ×2 IMPLANT
KIT ENCORE 26 ADVANTAGE (KITS) ×2 IMPLANT
KIT INTRODUCER GALT 7 (INTRODUCER) ×1 IMPLANT
KIT TURNOVER KIT B (KITS) ×2 IMPLANT
NDL HYPO 25GX1X1/2 BEV (NEEDLE) IMPLANT
NEEDLE HYPO 25GX1X1/2 BEV (NEEDLE) IMPLANT
PACK CAROTID (CUSTOM PROCEDURE TRAY) ×2 IMPLANT
PENCIL SMOKE EVACUATOR (MISCELLANEOUS) IMPLANT
POSITIONER HEAD DONUT 9IN (MISCELLANEOUS) ×2 IMPLANT
PROTECTION STATION PRESSURIZED (MISCELLANEOUS)
SET MICROPUNCTURE 5F STIFF (MISCELLANEOUS) IMPLANT
SHEATH AVANTI 11CM 5FR (SHEATH) IMPLANT
SHUNT CAROTID BYPASS 10 (VASCULAR PRODUCTS) IMPLANT
STATION PROTECTION PRESSURIZED (MISCELLANEOUS) IMPLANT
STENT TRANSCAROTID SYSTEM 8X40 (Permanent Stent) ×1 IMPLANT
STRIP CLOSURE SKIN 1/2X4 (GAUZE/BANDAGES/DRESSINGS) ×2 IMPLANT
SUT MNCRL AB 4-0 PS2 18 (SUTURE) ×2 IMPLANT
SUT PROLENE 5 0 C 1 24 (SUTURE) ×2 IMPLANT
SUT PROLENE 6 0 BV (SUTURE) ×1 IMPLANT
SUT SILK 2 0 SH CR/8 (SUTURE) ×2 IMPLANT
SUT VIC AB 3-0 SH 27 (SUTURE) ×2
SUT VIC AB 3-0 SH 27X BRD (SUTURE) ×1 IMPLANT
SYR 10ML LL (SYRINGE) ×6 IMPLANT
SYR 20ML LL LF (SYRINGE) ×2 IMPLANT
SYR CONTROL 10ML LL (SYRINGE) IMPLANT
SYSTEM TRANSCAROTID NEUROPRTCT (MISCELLANEOUS) ×1 IMPLANT
TOWEL GREEN STERILE (TOWEL DISPOSABLE) ×2 IMPLANT
TRANSCAROTID NEUROPROTECT SYS (MISCELLANEOUS) ×2
WATER STERILE IRR 1000ML POUR (IV SOLUTION) ×2 IMPLANT
WIRE BENTSON .035X145CM (WIRE) ×2 IMPLANT

## 2021-05-05 NOTE — Progress Notes (Signed)
Per Laretta Bolster at [facility] laboratories, covid test is negative.  Patient was tested on 05/03/21.  Waiting for results to cross over into [facility].

## 2021-05-05 NOTE — Anesthesia Procedure Notes (Signed)
Procedure Name: Intubation Date/Time: 05/05/2021 10:41 AM Performed by: Barrington Ellison, CRNA Pre-anesthesia Checklist: Patient identified, Emergency Drugs available, Suction available and Patient being monitored Patient Re-evaluated:Patient Re-evaluated prior to induction Oxygen Delivery Method: Circle System Utilized Preoxygenation: Pre-oxygenation with 100% oxygen Induction Type: IV induction Ventilation: Mask ventilation without difficulty and Oral airway inserted - appropriate to patient size Laryngoscope Size: Mac and 4 Grade View: Grade I Tube type: Oral Tube size: 7.5 mm Number of attempts: 1 Airway Equipment and Method: Stylet and Oral airway Placement Confirmation: ETT inserted through vocal cords under direct vision, positive ETCO2 and breath sounds checked- equal and bilateral Secured at: 23 cm Tube secured with: Tape Dental Injury: Teeth and Oropharynx as per pre-operative assessment

## 2021-05-05 NOTE — Anesthesia Preprocedure Evaluation (Signed)
Anesthesia Evaluation  Patient identified by MRN, date of birth, ID band Patient awake    Reviewed: Allergy & Precautions, H&P , NPO status , Patient's Chart, lab work & pertinent test results  Airway Mallampati: II   Neck ROM: full    Dental   Pulmonary shortness of breath, COPD, former smoker,    breath sounds clear to auscultation       Cardiovascular hypertension, + CAD, + Cardiac Stents, + CABG and + Peripheral Vascular Disease   Rhythm:regular Rate:Normal  Right subclavian artery stenosis   Neuro/Psych  Headaches, PSYCHIATRIC DISORDERS Anxiety Depression  Neuromuscular disease    GI/Hepatic GERD  ,  Endo/Other  Hypothyroidism   Renal/GU      Musculoskeletal  (+) Arthritis ,   Abdominal   Peds  Hematology   Anesthesia Other Findings   Reproductive/Obstetrics                             Anesthesia Physical Anesthesia Plan  ASA: 3  Anesthesia Plan: General   Post-op Pain Management:    Induction: Intravenous  PONV Risk Score and Plan: 2 and Ondansetron, Dexamethasone, Midazolam and Treatment may vary due to age or medical condition  Airway Management Planned: Oral ETT  Additional Equipment: Arterial line  Intra-op Plan:   Post-operative Plan: Extubation in OR  Informed Consent: I have reviewed the patients History and Physical, chart, labs and discussed the procedure including the risks, benefits and alternatives for the proposed anesthesia with the patient or authorized representative who has indicated his/her understanding and acceptance.     Dental advisory given  Plan Discussed with: CRNA, Anesthesiologist and Surgeon  Anesthesia Plan Comments:         Anesthesia Quick Evaluation

## 2021-05-05 NOTE — Interval H&P Note (Signed)
History and Physical Interval Note:  05/05/2021 10:18 AM  [name]  has presented today for surgery, with the diagnosis of Right Carotid Stenosis.  The various methods of treatment have been discussed with the patient and family. After consideration of risks, benefits and other options for treatment, the patient has consented to  Procedure(s): RIGHT TRANSCAROTID ARTERY REVASCULARIZATION (Right) as a surgical intervention.  The patient's history has been reviewed, patient examined, no change in status, stable for surgery.  I have reviewed the patient's chart and labs.  Questions were answered to the patient's satisfaction.     Cherre Robins

## 2021-05-05 NOTE — Discharge Instructions (Signed)
   [facility]  Discharge Instructions   Carotid Endarterectomy (CEA)  Please refer to the following instructions for your post-procedure care. Your surgeon or physician assistant will discuss any changes with you.  Activity  You are encouraged to walk as much as you can. You can slowly return to normal activities but must avoid strenuous activity and heavy lifting until your doctor tell you it's OK. Avoid activities such as vacuuming or swinging a golf club. You can drive after one week if you are comfortable and you are no longer taking prescription pain medications. It is normal to feel tired for serval weeks after your surgery. It is also normal to have difficulty with sleep habits, eating, and bowel movements after surgery. These will go away with time.  Bathing/Showering  You may shower after you come home. Do not soak in a bathtub, hot tub, or swim until the incision heals completely.  Incision Care  Shower every day. Clean your incision with mild soap and water. Pat the area dry with a clean towel. You do not need a bandage unless otherwise instructed. Do not apply any ointments or creams to your incision. You may have skin glue on your incision. Do not peel it off. It will come off on its own in about one week. Your incision may feel thickened and raised for several weeks after your surgery. This is normal and the skin will soften over time. For Men Only: It's OK to shave around the incision but do not shave the incision itself for 2 weeks. It is common to have numbness under your chin that could last for several months.  Diet  Resume your normal diet. There are no special food restrictions following this procedure. A low fat/low cholesterol diet is recommended for all patients with vascular disease. In order to heal from your surgery, it is CRITICAL to get adequate nutrition. Your body requires vitamins, minerals, and protein. Vegetables are the best  source of vitamins and minerals. Vegetables also provide the perfect balance of protein. Processed food has little nutritional value, so try to avoid this.        Medications  Resume taking all of your medications unless your doctor or physician assistant tells you not to. If your incision is causing pain, you may take over-the- counter pain relievers such as acetaminophen (Tylenol). If you were prescribed a stronger pain medication, please be aware these medications can cause nausea and constipation. Prevent nausea by taking the medication with a snack or meal. Avoid constipation by drinking plenty of fluids and eating foods with a high amount of fiber, such as fruits, vegetables, and grains. Do not take Tylenol if you are taking prescription pain medications.  Follow Up  Our office will schedule a follow up appointment 2-3 weeks following discharge.  Please call us immediately for any of the following conditions  Increased pain, redness, drainage (pus) from your incision site. Fever of 101 degrees or higher. If you should develop stroke (slurred speech, difficulty swallowing, weakness on one side of your body, loss of vision) you should call 911 and go to the nearest emergency room.  Reduce your risk of vascular disease:  Stop smoking. If you would like help call [facility] at 1-800-QUIT-NOW (1-800-784-8669) or [place] at 336-586-4000. Manage your cholesterol Maintain a desired weight Control your diabetes Keep your blood pressure down  If you have any questions, please call the office at [phone].   

## 2021-05-05 NOTE — Progress Notes (Signed)
Arrived from PACU to 4E08 at 16:00.  CHG bath given. CCMD notified Neuro intact. Vital sign monitoring per protocol.

## 2021-05-05 NOTE — Op Note (Addendum)
DATE OF SERVICE: 05/05/2021  PATIENT:  [name]  70 y.o. male  PRE-OPERATIVE DIAGNOSIS:  asymptomatic severe (greater than %90) carotid stenosis  POST-OPERATIVE DIAGNOSIS:  Same  PROCEDURE:   Right transcarotid artery revascularization (TCAR)  SURGEON:  Surgeon(s) and Role:    * Cherre Robins, MD - Primary    * Serafina Mitchell, MD - Assisting  ASSISTANT: Theotis Burrow, MD  An assistant was required to facilitate exposure and expedite the case.  ANESTHESIA:   general  EBL: min  BLOOD ADMINISTERED:none  DRAINS: none   LOCAL MEDICATIONS USED:  NONE  SPECIMEN:  none  COUNTS: confirmed correct.  TOURNIQUET:  none  PATIENT DISPOSITION:  PACU - hemodynamically stable.   Delay start of Pharmacological VTE agent (>24hrs) due to surgical blood loss or risk of bleeding: no  INDICATION FOR PROCEDURE: [name] is a 70 y.o. male with severe asymptomatic right carotid artery stenosis. He has a history of neck irradiation. After careful discussion of risks, benefits, and alternatives the patient was offered TCAR. We specifically discussed risk of stroke, cranial nerve injury, hematoma. The patient understood and wished to proceed.  OPERATIVE FINDINGS: severe (>90%) carotid bifurcation stenosis. No residual stenosis after stenting. Patient awoke from anesthesia neurologically intact.  DESCRIPTION OF PROCEDURE: After identification of the patient in the pre-operative holding area, the patient was transferred to the operating room. The patient was positioned supine on the operating room table. Anesthesia was induced. The right neck and groins were prepped and draped in standard fashion. A surgical pause was performed confirming correct patient, procedure, and operative location.  Using intraoperative ultrasound the course of the right common carotid artery was mapped on the skin.  A transverse incision was made between the sternal and clavicular heads of the  sternocleidomastoid muscle, below the omohyoid. Following longitudinal division of the carotid sheath the jugular vein was partially skeletonized and retracted medially. Once 3 cm of common carotid artery (CCA) were isolated, umbilical tape was placed around the proximal 1/3 of the CCA under direct vision. A 5-O Prolene suture was pre-placed in the anterior wall of the CCA, in a "U stitch" configuration,  close to the clavicle to facilitate hemostasis upon removal of the arterial sheath at completion of the TCAR procedure.   The contralateral common femoral vein (CFV) was accessed under ultrasound guidance, using standard Seldinger and micropuncture access technique. The venous return sheath was advanced into the CFV over the 0.035" wire provided. Blood was aspirated from the flow line followed by flushing of the Venous Sheath with heparinized saline. The Venous Sheath was secured to the patient's skin with suture to maintain  optimal position in the vessel. Heparin was given to obtain a therapeutic activated clotting time >250 seconds prior to arterial access.   A 4-French non-stiffened ENHANCE Transcarotid / Peripheral Access set was used, puncturing the artery with the 21G needle through the pre-placed "U" stitch while holding gentle traction on the umbilical tape to stabilize and centralize the CCA within the incision. Careful attention was paid to the change in CCA shape when using the umbilical tape to control or lift the artery. The micropuncture wire was then advanced 3-4 cm into the CCA and, the 21G needle was removed. The micropuncture sheath was advanced 2-3 cm into the CCA and the wire and dilator were removed. Pulsatile backflow indicated correct positioning. The provided 0.035" J-tipped guidewire was inserted as close as possible to the bifurcation without engaging the lesion. After micropuncture  sheath removal, the Transcarotid Arterial Sheath was advanced to the 2.5cm marker and the 0.035"  wire and dilator were then removed. Arterial Sheath position was assessed under fluoroscopy in two projections to ensure that the sheath tip was oriented coaxially in the CCA. The Arterial Sheath was sutured to the patient with gentle forward tension. Blood was slowly aspirated followed by flushing with heparinized saline. No ingress of air bubbles through the passive hemostatic valve was observed. The stopcocks were closed. Traction applied to the CCA previously to facilitate access was gently released.  The Flow Controller was connected to the Transcarotid Arterial Sheath, prepared by passively allowing a column of arterial blood to fill the line and connected to the Venous Return Sheath. CCA inflow was occluded proximal to the arteriotomy with a vascular clamp to achieve active flow reversal. To confirm flow reversal, a saline bolus was delivered into the venous flow line on both "High" and "Low" flow settings of the Flow Controller. Angiograms were performed with slow injections of a small amount of contrast filling just past the lesion to minimize antegrade transmission of micro-bubbles.  Prior to lesion manipulation, heart rate (XX123456) and systolic BP (123456) were managed upwards to optimize flow reversal and procedural neuroprotection. The lesion was crossed with an 0.014" ENROUTE guidewire and pre-dilation of the lesion was performed with a 5x56m rapid exchange 0.014" compatible balloon catheter to 8 atmospheres for 10 seconds. Stenting was performed with an [id number] Transcarotid stent, sized appropriately to the right CCA.  A total of 9 minutes of flow reversal was used.  AP and lateral angiograms (gentle contrast injections) were performed to confirm stent placement and arterial wall stent apposition. [facility] completion, antegrade flow was restored by releasing the clamp on the CCA then closing the NPS stopcocks to the flow lines. The Transcarotid Arterial Sheath was removed and  the pre-closure suture was tied. Heparin was reversed with protamine. The Venous Return Sheath was removed and hemostasis was achieved with brief manual compression.   Upon completion of the case instrument and sharps counts were confirmed correct. The patient was transferred to the PACU in good condition. I was present for all portions of the procedure.  ThYevonne AlineHaStanford BreedMD [facility] Number: ([phone]/01/2021 11:55 AM

## 2021-05-05 NOTE — Transfer of Care (Signed)
Immediate Anesthesia Transfer of Care Note  Patient: [name]  Procedure(s) Performed: RIGHT TRANSCAROTID ARTERY REVASCULARIZATION (Right: Neck)  Patient Location: PACU  Anesthesia Type:General  Level of Consciousness: awake and oriented  Airway & Oxygen Therapy: Patient Spontanous Breathing  Post-op Assessment: Report given to RN  Post vital signs: Reviewed and stable  Last Vitals:  Vitals Value Taken Time  BP 139/80 05/05/21 1207  Temp    Pulse 85 05/05/21 1210  Resp 15 05/05/21 1210  SpO2 95 % 05/05/21 1210  Vitals shown include unvalidated device data.  Last Pain:  Vitals:   05/05/21 0943  TempSrc:   PainSc: 0-No pain         Complications: No notable events documented.

## 2021-05-05 NOTE — Progress Notes (Signed)
Looks good post op. Appears at neurologic baseline: CN intact, 5/5 throughout, clear voice.  Incision in neck clean and dry. OK to DC a line.  [name]. Stanford Breed, MD [facility] Phone Number: [phone] 05/05/2021 4:20 PM

## 2021-05-06 LAB — CBC
HCT: 33 % — ABNORMAL LOW (ref 39.0–52.0)
Hemoglobin: 11.7 g/dL — ABNORMAL LOW (ref 13.0–17.0)
MCH: 31.8 pg (ref 26.0–34.0)
MCHC: 35.5 g/dL (ref 30.0–36.0)
MCV: 89.7 fL (ref 80.0–100.0)
Platelets: 145 10*3/uL — ABNORMAL LOW (ref 150–400)
RBC: 3.68 MIL/uL — ABNORMAL LOW (ref 4.22–5.81)
RDW: 12.8 % (ref 11.5–15.5)
WBC: 7.3 10*3/uL (ref 4.0–10.5)
nRBC: 0 % (ref 0.0–0.2)

## 2021-05-06 LAB — BASIC METABOLIC PANEL
Anion gap: 9 (ref 5–15)
BUN: 17 mg/dL (ref 8–23)
CO2: 25 mmol/L (ref 22–32)
Calcium: 8.6 mg/dL — ABNORMAL LOW (ref 8.9–10.3)
Chloride: 104 mmol/L (ref 98–111)
Creatinine, Ser: 1.3 mg/dL — ABNORMAL HIGH (ref 0.61–1.24)
GFR, Estimated: 59 mL/min — ABNORMAL LOW (ref >60)
Glucose, Bld: 132 mg/dL — ABNORMAL HIGH (ref 70–99)
Potassium: 3.6 mmol/L (ref 3.5–5.1)
Sodium: 138 mmol/L (ref 135–145)

## 2021-05-06 LAB — LIPID PANEL
Cholesterol: 141 mg/dL (ref 0–200)
HDL: 65 mg/dL (ref >40)
LDL Cholesterol: 54 mg/dL (ref 0–99)
Total CHOL/HDL Ratio: 2.2 RATIO
Triglycerides: 110 mg/dL (ref <150)
VLDL: 22 mg/dL (ref 0–40)

## 2021-05-06 MED ORDER — OXYCODONE-ACETAMINOPHEN 5-325 MG PO TABS: 1.0000 | ORAL_TABLET | Freq: Four times a day (QID) | ORAL | 0 refills | Status: DC | PRN

## 2021-05-06 NOTE — Progress Notes (Signed)
PHARMACIST LIPID MONITORING   [name] is a 70 y.o. male admitted on 05/05/2021 with carotid stenosis.  Pharmacy has been consulted to optimize lipid-lowering therapy with the indication of secondary prevention for clinical ASCVD.  Recent Labs:  Lipid Panel (last 6 months):   Lab Results  Component Value Date   CHOL 141 05/06/2021   TRIG 110 05/06/2021   HDL 65 05/06/2021   CHOLHDL 2.2 05/06/2021   VLDL 22 05/06/2021   LDLCALC 54 05/06/2021    Hepatic function panel (last 6 months):   Lab Results  Component Value Date   AST 29 05/05/2021   ALT 28 05/05/2021   ALKPHOS 57 05/05/2021   BILITOT 0.8 05/05/2021    SCr (since admission):   Serum creatinine: 1.3 mg/dL (H) 05/06/21 0432 Estimated creatinine clearance: 49.9 mL/min (A)  Current therapy and lipid therapy tolerance Current lipid-lowering therapy: none Previous lipid-lowering therapies (if applicable): Rosuvastatin, atorvastatin, fenofibrate Documented or reported allergies or intolerances to lipid-lowering therapies (if applicable): all previous therapies worsened ability to swallow/speak in setting of tongue SCC.   Assessment:   Pt is very clear that he has tried multiple lipid-lowering therapies that worsened his ability to swallow and speak Patient has spoken with different doctors about this and after trying different meds and doses, was told to stop all. His LDL is at goal < 70 without any meds.   Plan:    1.Statin intensity (high intensity recommended for all patients regardless of the LDL):  Statin intolerance noted. No statin changes due to serious side effects (ex. Myalgias with at least 2 different statins).  2.Add ezetimibe (if any one of the following):   Not indicated at this time.  3.Refer to lipid clinic:   No  4.Follow-up with:  Primary care provider - Baxley, Cresenciano Lick, MD  5.Follow-up labs after discharge:  No changes in lipid therapy, repeat a lipid panel in one year.       Benetta Spar,  PharmD, BCPS, BCCP Clinical Pharmacist  Please check AMION for all [place] phone numbers After 10:00 PM, call [place] [phone]

## 2021-05-06 NOTE — Discharge Summary (Signed)
Discharge Summary      [name] 21-Feb-1951 70 y.o. male  [id number]  Admission Date: 05/05/2021  Discharge Date: 05/06/21  Physician: Cherre Robins, MD  Admission Diagnosis: Carotid stenosis, right [I65.21]  Discharge Day services:    See progress note 05/06/21  Hospital Course:  The patient was admitted to the hospital and taken to the operating room on 05/05/2021 and underwent right transcarotid artery revascularization by Dr. Stanford Breed.  He tolerated the procedure well and was admitted to the hospital postoperatively.  POD #1 patient's neuro status remained at baseline.  He will continue his aspirin and Brilinta.  He will be prescribed 1 to 2 days of narcotic pain medication for continued postoperative pain control.  He will be discharged home this morning in stable condition.  He will follow-up in office in 1 month for carotid duplex.    Recent Labs    05/05/21 0852 05/06/21 0432  NA 142 138  K 3.3* 3.6  CL 107 104  CO2 26 25  GLUCOSE 116* 132*  BUN 18 17  CALCIUM 9.0 8.6*   Recent Labs    05/05/21 0852 05/06/21 0432  WBC 3.8* 7.3  HGB 13.8 11.7*  HCT 38.8* 33.0*  PLT 167 145*   Recent Labs    05/05/21 0852  INR 1.0       Discharge Diagnosis:  Carotid stenosis, right [I65.21]  Secondary Diagnosis: Patient Active Problem List   Diagnosis Date Noted   Carotid stenosis, right 05/05/2021   Former smoker 03/24/2020   SOB (shortness of breath) 03/24/2020   Lumbar disc herniation 06/17/2019   Right lumbar radiculopathy 05/16/2019   Elevated PSA 05/16/2019   S/P angioplasty with stent 06/14/16 PCI & DES of high grade AV groove LCX and 1st OM  06/15/2016   CAD (coronary artery disease) 06/14/2016   Abnormal stress test    Carotid artery disease ([place]) 05/20/2013   Hypothyroidism 03/29/2012   Erectile dysfunction 07/02/2011   Anxiety 07/02/2011   Back pain 07/02/2011   Cervical disc disease 07/02/2011   Coronary artery disease 04/29/2011    Squamous cell carcinoma of tongue ([place]) 04/29/2011   Hypertension 04/29/2011   Depression 04/29/2011   GE reflux 04/29/2011   Internal carotid artery stenosis 04/29/2011   Ptosis of eyelid, left 04/29/2011   Hyperlipidemia 04/29/2011   History of ischemic vertebrobasilar artery thalamic stroke 04/29/2011   Attention deficit disorder 04/29/2011   Degenerative joint disease of cervical spine 04/29/2011   Past Medical History:  Diagnosis Date   ADHD (attention deficit hyperactivity disorder)    Anxiety    Arthritis    "NECK, HANDS, KNEES" (06/14/2016)   CAD (coronary artery disease)    Carotid artery stenosis    Complication of anesthesia    Reports that he had trouble swallowing post anesthesia- CABG   COPD (chronic obstructive pulmonary disease) ([place]) 12/11/2011   r/s mv - EF 72%; exercise capacity 13 METS; no exercised induced ischemic EKG changes   Depression, major    Dyspnea    with exertion   GERD (gastroesophageal reflux disease)    HTN (hypertension), benign 04/16/2008   echo - EF >55%; no regional wall or valvular abnormalities   Hyperlipidemia    statin intolerant   Hypothyroidism    Migraine    "crippling headaches as a kid; silent migraines now, no pain, couple times/week" (06/14/2016)   Peripheral vascular disease ([place])    Prostate cancer ([place]) 04/2019   recent dx - Dr Lawerance Bach  S/P angioplasty with stent 06/14/16 PCI & DES of high grade AV groove LCX and 1st OM  06/15/2016   Squamous cell cancer of tongue ([place]) ~ 2002   "35 radiation treatments at [facility]"   Subclavian steal syndrome 06/17/2012   carotid doppler - R systolic brachial pressure 174mHg, L 1574mg; R subclavian artery - proxmial obsstruction w/ abnormal monophasic waveforms, R ECA known occlusive disease; L ECA narrowing w/ 70-99% diameter reductiona   TIA (transient ischemic attack) 2005   Viral hepatitis 1970s   "non specific"    Allergies as of 05/06/2021       Reactions   Crestor  [rosuvastatin Calcium] Other (See Comments)   Difficulty swallowing   Tramadol Shortness Of Breath   Lisinopril Cough   Gabapentin Other (See Comments)   Hallucinations   Tricor [fenofibrate] Other (See Comments)   UNSPECIFIED REACTION   Lipitor [atorvastatin Calcium] Other (See Comments)   Muscle spasms        Medication List     TAKE these medications    albuterol 108 (90 Base) MCG/ACT inhaler Commonly known as: VENTOLIN HFA Inhale 2 puffs into the lungs every 6 (six) hours as needed for wheezing or shortness of breath.   amphetamine-dextroamphetamine 20 MG tablet Commonly known as: ADDERALL Take one tablet by mouth in the morning, one tab at noon and one tablet at 4pm.   aspirin EC 81 MG tablet Take 81 mg by mouth daily. Swallow whole.   Brilinta 90 MG Tabs tablet Generic drug: ticagrelor Take 1 tablet (90 mg total) by mouth 2 (two) times daily.   oxyCODONE-acetaminophen 5-325 MG tablet Commonly known as: PERCOCET/ROXICET Take 1 tablet by mouth every 6 (six) hours as needed for moderate pain.       ASK your doctor about these medications    amLODipine 10 MG tablet Commonly known as: NORVASC TAKE 1 TABLET BY MOUTH DAILY   hydrochlorothiazide 12.5 MG capsule Commonly known as: MICROZIDE TAKE 1 CAPSULE BY MOUTH DAILY   levothyroxine 75 MCG tablet Commonly known as: SYNTHROID TAKE 1 TABLET BY MOUTH ONCE DAILY   lidocaine 5 % ointment Commonly known as: XYLOCAINE Apply 1 application topically as needed.   LORazepam 1 MG tablet Commonly known as: Ativan TAKE 1 TABLET FOUR TIMES DAILY AS NEEDED.  PER DOCTOR: MAKE APPT NOW!   pantoprazole 40 MG tablet Commonly known as: PROTONIX TAKE 1 TABLET (40 MG TOTAL) BY MOUTH DAILY.   Praluent 150 MG/ML [place] Generic drug: Alirocumab INJECT 150 MG INTO THE SKIN EVERY 14 (FOURTEEN) DAYS.   tadalafil 5 MG tablet Commonly known as: CIALIS Take 1 tablet (5 mg total) by mouth daily. Take 3-5 times a week for  penile rehabilitation. May take up to '20mg'$  as needed for intercourse.   traZODone 50 MG tablet Commonly known as: DESYREL Take 1 - 3 tablets by mouth at bedtime as needed   Vilazodone HCl 40 MG Tabs Commonly known as: Viibryd Take 1/2 tablet by mouth daily with a full meal for 7 days then increase to 1 tablet daily         Discharge Instructions:   [facility] Carotid Endarterectomy (CEA)  Please refer to the following instructions for your post-procedure care. Your surgeon or physician assistant will discuss any changes with you.  Activity  You are encouraged to walk as much as you can. You can slowly return to normal activities but must avoid strenuous activity and heavy lifting until  your doctor tell you it's OK. Avoid activities such as vacuuming or swinging a golf club. You can drive after one week if you are comfortable and you are no longer taking prescription pain medications. It is normal to feel tired for serval weeks after your surgery. It is also normal to have difficulty with sleep habits, eating, and bowel movements after surgery. These will go away with time.  Bathing/Showering  You may shower after you come home. Do not soak in a bathtub, hot tub, or swim until the incision heals completely.  Incision Care  Shower every day. Clean your incision with mild soap and water. Pat the area dry with a clean towel. You do not need a bandage unless otherwise instructed. Do not apply any ointments or creams to your incision. You may have skin glue on your incision. Do not peel it off. It will come off on its own in about one week. Your incision may feel thickened and raised for several weeks after your surgery. This is normal and the skin will soften over time. For Men Only: It's OK to shave around the incision but do not shave the incision itself for 2 weeks. It is common to have numbness under your chin that could last for  several months.  Diet  Resume your normal diet. There are no special food restrictions following this procedure. A low fat/low cholesterol diet is recommended for all patients with vascular disease. In order to heal from your surgery, it is CRITICAL to get adequate nutrition. Your body requires vitamins, minerals, and protein. Vegetables are the best source of vitamins and minerals. Vegetables also provide the perfect balance of protein. Processed food has little nutritional value, so try to avoid this.  Medications  Resume taking all of your medications unless your doctor or physician assistant tells you not to.  If your incision is causing pain, you may take over-the- counter pain relievers such as acetaminophen (Tylenol). If you were prescribed a stronger pain medication, please be aware these medications can cause nausea and constipation.  Prevent nausea by taking the medication with a snack or meal. Avoid constipation by drinking plenty of fluids and eating foods with a high amount of fiber, such as fruits, vegetables, and grains. Do not take Tylenol if you are taking prescription pain medications.  Follow Up  Our office will schedule a follow up appointment 2-3 weeks following discharge.  Please call us immediately for any of the following conditions  Increased pain, redness, drainage (pus) from your incision site. Fever of 101 degrees or higher. If you should develop stroke (slurred speech, difficulty swallowing, weakness on one side of your body, loss of vision) you should call 911 and go to the nearest emergency room.  Reduce your risk of vascular disease:  Stop smoking. If you would like help call [facility] at 1-800-QUIT-NOW (954)194-1384) or [place] at (913) 807-1477. Manage your cholesterol Maintain a desired weight Control your diabetes Keep your blood pressure down  If you have any questions, please call the office at [phone].   Disposition: home  Patient's  condition: is Good  Follow up: 1. Dr. Stanford Breed in 4 weeks.   Dagoberto Ligas, PA-C [facility] [phone]   --- For [facility] Registry use ---   Modified Rankin score at D/C (0-6): 0  IV medication needed for:  1. Hypertension: No 2. Hypotension: No  Post-op Complications: No  1. Post-op CVA or TIA: No  If yes: Event classification (right eye, left eye,  right cortical, left cortical, verterobasilar, other):   If yes: Timing of event (intra-op, <6 hrs post-op, >=6 hrs post-op, unknown):   2. CN injury: No  If yes: CN  injuried   3. Myocardial infarction: No  If yes: Dx by (EKG or clinical, Troponin):   4.  CHF: No  5.  Dysrhythmia (new): No  6. Wound infection: No  7. Reperfusion symptoms: No  8. Return to OR: No  If yes: return to OR for (bleeding, neurologic, other CEA incision, other):   Discharge medications: Statin use:  No ASA use:  Yes   Beta blocker use:  No ACE-Inhibitor use:  No  ARB use:  No CCB use: No P2Y12 Antagonist use: Yes, '[ ]'$  Plavix, '[ ]'$  Plasugrel, '[ ]'$  Ticlopinine, [x ] Ticagrelor, '[ ]'$  Other, '[ ]'$  No for medical reason, '[ ]'$  Non-compliant, '[ ]'$  Not-indicated Anti-coagulant use:  No, '[ ]'$  Warfarin, '[ ]'$  Rivaroxaban, '[ ]'$  Dabigatran,

## 2021-05-06 NOTE — Progress Notes (Addendum)
  Progress Note    05/06/2021 8:38 AM 1 Day Post-Op  Subjective:  no neuro events overnight; ready for d/c home   Vitals:   05/06/21 0724 05/06/21 0808  BP: 125/69 125/69  Pulse: 65 65  Resp: 20 20  Temp: 97.6 F (36.4 C) 97.6 F (36.4 C)  SpO2: 96% 96%   Physical Exam: Lungs:  non labored Incisions:  R neck incision c/d/i Extremities:  moving all extremities well; L groin vein puncture site without hematoma Abdomen:  soft Neurologic: A&O; CN grossly intact  CBC    Component Value Date/Time   WBC 7.3 05/06/2021 0432   RBC 3.68 (L) 05/06/2021 0432   HGB 11.7 (L) 05/06/2021 0432   HCT 33.0 (L) 05/06/2021 0432   PLT 145 (L) 05/06/2021 0432   MCV 89.7 05/06/2021 0432   MCH 31.8 05/06/2021 0432   MCHC 35.5 05/06/2021 0432   RDW 12.8 05/06/2021 0432   LYMPHSABS 355 (L) 03/30/2021 0950   MONOABS 320 01/01/2017 1234   EOSABS 269 03/30/2021 0950   BASOSABS 48 03/30/2021 0950    BMET    Component Value Date/Time   NA 138 05/06/2021 0432   K 3.6 05/06/2021 0432   CL 104 05/06/2021 0432   CO2 25 05/06/2021 0432   GLUCOSE 132 (H) 05/06/2021 0432   BUN 17 05/06/2021 0432   CREATININE 1.30 (H) 05/06/2021 0432   CREATININE 1.09 03/30/2021 0950   CALCIUM 8.6 (L) 05/06/2021 0432   GFRNONAA 59 (L) 05/06/2021 0432   GFRNONAA 69 03/30/2021 0950   GFRAA 80 03/30/2021 0950    INR    Component Value Date/Time   INR 1.0 05/05/2021 0852     Intake/Output Summary (Last 24 hours) at 05/06/2021 0838 Last data filed at 05/06/2021 0711 Gross per 24 hour  Intake 1800 ml  Output 2275 ml  Net -475 ml     Assessment/Plan:  70 y.o. male is s/p R TCAR for high grade asymptomatic stenosis 1 Day Post-Op   Neuro exam remains at baseline R neck incision unremarkable Ok for d/c home; carotid duplex in 4 weeks per protocol   Dagoberto Ligas, PA-C [facility] [phone] 05/06/2021 8:38 AM  VASCULAR STAFF ADDENDUM: I have independently interviewed and  examined the patient. I agree with the above.  No neurologic problems s/p TCAR. Doing well, safe for discharge. Continue ASA / ticagrelor / statin. Follow up with me in 4 weeks.  Yevonne Aline. Stanford Breed, MD [facility] Phone Number: [phone] 05/06/2021 9:40 AM

## 2021-05-06 NOTE — Plan of Care (Signed)
  Problem: Clinical Measurements: Goal: Will remain free from infection Outcome: Progressing Goal: Diagnostic test results will improve Outcome: Progressing   

## 2021-05-08 ENCOUNTER — Other Ambulatory Visit: Payer: Self-pay | Admitting: Internal Medicine

## 2021-05-08 ENCOUNTER — Other Ambulatory Visit: Payer: Self-pay | Admitting: Cardiovascular Disease

## 2021-05-08 ENCOUNTER — Other Ambulatory Visit (HOSPITAL_BASED_OUTPATIENT_CLINIC_OR_DEPARTMENT_OTHER): Payer: Self-pay

## 2021-05-08 ENCOUNTER — Encounter (HOSPITAL_COMMUNITY): Payer: Self-pay | Admitting: Vascular Surgery

## 2021-05-08 ENCOUNTER — Other Ambulatory Visit: Payer: Self-pay | Admitting: *Deleted

## 2021-05-08 DIAGNOSIS — I10 Essential (primary) hypertension: Secondary | ICD-10-CM

## 2021-05-08 MED ORDER — HYDROCHLOROTHIAZIDE 12.5 MG PO CAPS
ORAL_CAPSULE | Freq: Every day | ORAL | 3 refills | Status: DC
  Filled 2021-05-08: qty 90, 90d supply, fill #0
  Filled 2021-08-17: qty 90, 90d supply, fill #1
  Filled 2021-11-14: qty 90, 90d supply, fill #2
  Filled 2022-02-19: qty 90, 90d supply, fill #3

## 2021-05-08 MED ORDER — LEVOTHYROXINE SODIUM 75 MCG PO TABS
ORAL_TABLET | Freq: Every day | ORAL | 0 refills | Status: DC
  Filled 2021-05-08: qty 90, 90d supply, fill #0

## 2021-05-08 MED ORDER — PANTOPRAZOLE SODIUM 40 MG PO TBEC
DELAYED_RELEASE_TABLET | Freq: Every day | ORAL | 3 refills | Status: DC
  Filled 2021-05-08: qty 90, 90d supply, fill #0
  Filled 2021-08-10: qty 90, 90d supply, fill #1
  Filled 2021-11-06: qty 90, 90d supply, fill #2
  Filled 2022-02-05: qty 90, 90d supply, fill #3

## 2021-05-08 NOTE — Patient Outreach (Signed)
[facility]) Care Management  05/08/2021  [name] [birth year] [id number]   Transition of care call/case closure   Referral received:05/05/21 Initial outreach:05/08/21 Insurance: [place] [employer]    Subjective: Initial successful telephone call to patient's preferred number in order to complete transition of care assessment; 2 HIPAA identifiers verified. Explained purpose of call and completed transition of care assessment.  [name] states that he is doing well. He denies post-operative problems, says surgical incisions are unremarkable, states surgical pain well managed with prescribed medications, tolerating diet without difficulty , he  denies bowel or bladder problems. He reports tolerating mobility in the home without difficulty .  Spouse is are assisting with his recovery.  Patient expressed compliments of staff on [place] of care received during hospital stay.   Reviewed accessing the following [place] Benefits : He discussed ongoing health issues and participating in education videos as benefit of [place] condition management plan He does not have the hospital indemnity He  uses a [facility] outpatient pharmacy [facility] [employer].        Objective:  Mr.[name] was hospitalized at [facility] 8/5-05/06/21 for Right Transcarotid artery revasularization  Comorbidities include: Right cartoid stenosis, hypertension , cervical disc disease , s/p angioplasty stent, hyperlipidemia .  He was discharged to home on 05/06/21 without the need for home health services or DME.   Assessment:  Patient voices good understanding of all discharge instructions.  See transition of care flowsheet for assessment details.   Plan:  Reviewed hospital discharge diagnosis of Right transcarotid artery revascularization   and discharge treatment plan using hospital discharge instructions, assessing medication adherence, reviewing problems requiring provider  notification, and discussing the importance of follow up with surgeon, primary care provider and/or specialists as directed. Reinforced contacting surgeon office for post discharge appointment if no call received by today, patient agrees and has contact number.   Reviewed [place] healthy lifestyle program information to receive discounted premium for  2023   Step 1: Get  your annual physical  Step 2: Complete your health assessment  Step 3:Identify your current health status and complete the corresponding action step between October 01, 2020 and June 01, 2021.     No ongoing care management needs identified so will close case to [employer] Management services and route successful outreach letter with [place] Management pamphlet and 24 Hour Nurse Line Magnet to [place] Management clinical pool to be mailed to patient's home address.    Joylene Draft, RN, BSN  [place] Management Coordinator  [phone]- Mobile [phone]- Toll Free Main Office

## 2021-05-11 NOTE — Anesthesia Postprocedure Evaluation (Signed)
Anesthesia Post Note  Patient: [name]  Procedure(s) Performed: RIGHT TRANSCAROTID ARTERY REVASCULARIZATION (Right: Neck)     Patient location during evaluation: PACU Anesthesia Type: General Level of consciousness: awake and alert Pain management: pain level controlled Vital Signs Assessment: post-procedure vital signs reviewed and stable Respiratory status: spontaneous breathing, nonlabored ventilation, respiratory function stable and patient connected to nasal cannula oxygen Cardiovascular status: blood pressure returned to baseline and stable Postop Assessment: no apparent nausea or vomiting Anesthetic complications: no   No notable events documented.  Last Vitals:  Vitals:   05/06/21 0724 05/06/21 0808  BP: 125/69 125/69  Pulse: 65 65  Resp: 20 20  Temp: 36.4 C 36.4 C  SpO2: 96% 96%    Last Pain:  Vitals:   05/06/21 0808  TempSrc: Oral  PainSc: 0-No pain   Pain Goal: Patients Stated Pain Goal: 3 (05/05/21 1500)                 Kileigh Ortmann S

## 2021-05-17 ENCOUNTER — Other Ambulatory Visit (HOSPITAL_BASED_OUTPATIENT_CLINIC_OR_DEPARTMENT_OTHER): Payer: Self-pay

## 2021-05-17 MED ORDER — AMPHETAMINE-DEXTROAMPHETAMINE 20 MG PO TABS
ORAL_TABLET | ORAL | 0 refills | Status: DC
  Filled 2021-05-17: qty 270, 90d supply, fill #0

## 2021-05-18 ENCOUNTER — Other Ambulatory Visit: Payer: Self-pay

## 2021-05-18 ENCOUNTER — Other Ambulatory Visit (HOSPITAL_BASED_OUTPATIENT_CLINIC_OR_DEPARTMENT_OTHER): Payer: Self-pay

## 2021-05-18 ENCOUNTER — Telehealth (INDEPENDENT_AMBULATORY_CARE_PROVIDER_SITE_OTHER): Payer: 59 | Admitting: Internal Medicine

## 2021-05-18 ENCOUNTER — Encounter: Payer: Self-pay | Admitting: Internal Medicine

## 2021-05-18 ENCOUNTER — Telehealth: Payer: Self-pay | Admitting: Internal Medicine

## 2021-05-18 VITALS — BP 140/73 | HR 81 | Temp 99.3°F

## 2021-05-18 DIAGNOSIS — Z8673 Personal history of transient ischemic attack (TIA), and cerebral infarction without residual deficits: Secondary | ICD-10-CM

## 2021-05-18 DIAGNOSIS — U071 COVID-19: Secondary | ICD-10-CM | POA: Diagnosis not present

## 2021-05-18 DIAGNOSIS — E039 Hypothyroidism, unspecified: Secondary | ICD-10-CM | POA: Diagnosis not present

## 2021-05-18 DIAGNOSIS — R1319 Other dysphagia: Secondary | ICD-10-CM

## 2021-05-18 DIAGNOSIS — Z951 Presence of aortocoronary bypass graft: Secondary | ICD-10-CM

## 2021-05-18 DIAGNOSIS — F32A Depression, unspecified: Secondary | ICD-10-CM

## 2021-05-18 DIAGNOSIS — I10 Essential (primary) hypertension: Secondary | ICD-10-CM | POA: Diagnosis not present

## 2021-05-18 DIAGNOSIS — J449 Chronic obstructive pulmonary disease, unspecified: Secondary | ICD-10-CM | POA: Diagnosis not present

## 2021-05-18 DIAGNOSIS — I771 Stricture of artery: Secondary | ICD-10-CM

## 2021-05-18 DIAGNOSIS — E7849 Other hyperlipidemia: Secondary | ICD-10-CM | POA: Diagnosis not present

## 2021-05-18 DIAGNOSIS — C61 Malignant neoplasm of prostate: Secondary | ICD-10-CM

## 2021-05-18 DIAGNOSIS — Z7901 Long term (current) use of anticoagulants: Secondary | ICD-10-CM | POA: Diagnosis not present

## 2021-05-18 DIAGNOSIS — F419 Anxiety disorder, unspecified: Secondary | ICD-10-CM

## 2021-05-18 DIAGNOSIS — K2100 Gastro-esophageal reflux disease with esophagitis, without bleeding: Secondary | ICD-10-CM

## 2021-05-18 DIAGNOSIS — D649 Anemia, unspecified: Secondary | ICD-10-CM

## 2021-05-18 MED ORDER — BENZONATATE 100 MG PO CAPS
100.0000 mg | ORAL_CAPSULE | Freq: Three times a day (TID) | ORAL | 0 refills | Status: DC | PRN
  Filled 2021-05-18: qty 30, 10d supply, fill #0

## 2021-05-18 MED ORDER — NIRMATRELVIR/RITONAVIR (PAXLOVID)TABLET
3.0000 | ORAL_TABLET | Freq: Two times a day (BID) | ORAL | 0 refills | Status: AC
  Filled 2021-05-18: qty 30, 5d supply, fill #0

## 2021-05-18 MED ORDER — PRASUGREL HCL 10 MG PO TABS
10.0000 mg | ORAL_TABLET | Freq: Every day | ORAL | 0 refills | Status: DC
  Filled 2021-05-18: qty 30, 30d supply, fill #0

## 2021-05-18 NOTE — Progress Notes (Signed)
Subjective:    Patient ID: [name], male    DOB: [birth year], 70 y.o.   MRN: [id number]  HPI 70 year old Male underwent right transcarotid artery revascularization by Dr. Stanford Breed on August 5.  He was discharged from the hospital on August 6.  He has done well postoperatively.  Patient has had 3 COVID-19 immunizations by our records.  He awakened this morning with cough, headache, mucus in his throat and chills.  He has not had nausea and vomiting.  No sore throat.  He has been going out in public without a mask recently to [employer] and grocery store.  Due to the Coronavirus pandemic, patient is seen via interactive audio and video telecommunications today.  He is identified using 2 identifiers as [name]. Laurance Flatten, a patient in this practice.  He is at his home and I am at my office.  He is agreeable to visit in this format today.  Patient is seen in bed.  He looks a bit pale and fatigued.  He is complaining of dry cough, malaise and fatigue.  Says symptoms started suddenly early this morning around 4 AM.  He says his pulse oximetry has been 91% which is a bit concerning.  Says headache is quite bothersome.  He may take over-the-counter analgesics such as Aleve or Advil for the headache.  He tested positive by home test this morning for COVID-19  Patient has a history of prostate cancer and is being followed at [facility].  He had radical assisted robotic prostatectomy November 2021 and completed radiation treatments.  Recent PSA was less than 0.04 but has been as high as 12.6 in June 2021.  He had L2-L3 discectomy September 2020 for severe intractable low back pain.  Status post right carotid surgery by Dr. Stanford Breed for 80 to 99% blockage.  History of subclavian stenosis creating blood pressure differential in each arm.  History of hypertension.  History of GE reflux.  History of hyperplastic colon polyps.  History of shortness of breath and is a former smoker.  Has mild  COPD.  History of cervical spine arthropathy.  History of PCI to circumflex and first obtuse marginal branch December 2017.  History of anxiety and depression treated by Dr. Toy Care.  History of left thalamic stroke in 2005.  History of right subclavian artery stenosis (70%.)  History of squamous cell carcinoma of the tongue treated with radiation therapy in 2002.  Social history: He is currently retired.  He quit smoking over 20 years ago.  Social alcohol consumption.  Married.  1 son from previous marriage.     Review of Systems no vomiting or diarrhea.  Does have significant headache today.  Decreased appetite.     Objective:   Physical Exam Temperature 99.3 degrees, pulse 91, blood pressure 140/73 pulse oximetry on room air 91% the patient is in bed.  He appears to be pale and fatigued.  Does not appear to be tachypneic when giving history.       Assessment & Plan:   Acute COVID-19 virus infection  Status post right transcarotid revascularization procedure by Dr. Stanford Breed on August 5 and doing well  Plan: Due to multiple medical issues patient  is a candidate for Paxlovid.  Estimated GFR is greater than 60 cc/min based on c-Met August 5th.

## 2021-05-18 NOTE — Progress Notes (Signed)
Patient is s/p TCAR on 8/5 and now has been diagnosed with COVID. Paxlovid and Brillinta together are a contraindication for [place]. Discussed with Dr. Stanford Breed and called in a one month supply of Effient to bridge the gap.

## 2021-05-18 NOTE — Patient Instructions (Signed)
Patient will be prescribed pack Slo-Bid (regular strength) as his GFR is greater than 60 cc/min based on C-met August 5th.  He was prescribed Tessalon Perles 100 mg 3 times a day as needed for cough.  He will monitor pulse oximetry at home.  Was advised to walk several times daily to prevent atelectasis.  He will stay well-hydrated.  He will call if symptoms are not improving or worsen.

## 2021-05-18 NOTE — Telephone Encounter (Addendum)
Abdoulaye Maclellan [phone]  Vinnie Level called to say [name] tested positive for COVID this morning, he was having trouble sleeping, chest congestion, coughing, headache. Low grade fever. 2 vaccines and a booster

## 2021-05-18 NOTE — Telephone Encounter (Signed)
Scheduled

## 2021-05-19 ENCOUNTER — Other Ambulatory Visit (HOSPITAL_BASED_OUTPATIENT_CLINIC_OR_DEPARTMENT_OTHER): Payer: Self-pay

## 2021-05-22 ENCOUNTER — Other Ambulatory Visit: Payer: 59 | Admitting: Internal Medicine

## 2021-05-22 ENCOUNTER — Telehealth: Payer: Self-pay | Admitting: Internal Medicine

## 2021-05-22 NOTE — Telephone Encounter (Signed)
[place] results to [facility] [phone], phone [phone]  Positive results 05/18/2021

## 2021-05-25 ENCOUNTER — Ambulatory Visit: Payer: 59 | Admitting: Internal Medicine

## 2021-05-26 ENCOUNTER — Other Ambulatory Visit (HOSPITAL_BASED_OUTPATIENT_CLINIC_OR_DEPARTMENT_OTHER): Payer: Self-pay

## 2021-05-26 ENCOUNTER — Other Ambulatory Visit: Payer: Self-pay

## 2021-05-26 DIAGNOSIS — I6523 Occlusion and stenosis of bilateral carotid arteries: Secondary | ICD-10-CM

## 2021-05-26 MED ORDER — FLUOXETINE HCL 40 MG PO CAPS
ORAL_CAPSULE | ORAL | 1 refills | Status: DC
  Filled 2021-05-26: qty 30, 30d supply, fill #0
  Filled 2021-06-22: qty 30, 30d supply, fill #1

## 2021-06-06 ENCOUNTER — Ambulatory Visit (INDEPENDENT_AMBULATORY_CARE_PROVIDER_SITE_OTHER): Payer: 59 | Admitting: Physician Assistant

## 2021-06-06 ENCOUNTER — Other Ambulatory Visit: Payer: Self-pay

## 2021-06-06 ENCOUNTER — Ambulatory Visit (HOSPITAL_COMMUNITY)
Admission: RE | Admit: 2021-06-06 | Discharge: 2021-06-06 | Disposition: A | Discharge status: Home / Self Care | Payer: 59 | Source: Ambulatory Visit | Attending: Vascular Surgery | Admitting: Vascular Surgery

## 2021-06-06 VITALS — BP 127/79 | HR 65 | Temp 97.8°F | Resp 16 | Ht 67.0 in | Wt 148.0 lb

## 2021-06-06 DIAGNOSIS — I6523 Occlusion and stenosis of bilateral carotid arteries: Secondary | ICD-10-CM | POA: Diagnosis not present

## 2021-06-06 NOTE — Progress Notes (Signed)
POST OPERATIVE OFFICE NOTE    CC:  F/u for surgery  HPI:  This is a 70 y.o. male who is s/p right TCAR for asymptomatic right 80 - 99 % carotid artery stenosis. No complaints. He has not been taking his aspirin but has been taking prasugrel (Effient). Unable to take Plavix and statins. On Praluent injection.  Allergies  Allergen Reactions   Crestor [Rosuvastatin Calcium] Other (See Comments)    Difficulty swallowing   Tramadol Shortness Of Breath   Lisinopril Cough   Gabapentin Other (See Comments)    Hallucinations   Tricor [Fenofibrate] Other (See Comments)    UNSPECIFIED REACTION   Lipitor [Atorvastatin Calcium] Other (See Comments)    Muscle spasms    Current Outpatient Medications  Medication Sig Dispense Refill   albuterol (VENTOLIN HFA) 108 (90 Base) MCG/ACT inhaler Inhale 2 puffs into the lungs every 6 (six) hours as needed for wheezing or shortness of breath. 18 g 5   Alirocumab (PRALUENT) 150 MG/ML SOAJ INJECT 150 MG INTO THE SKIN EVERY 14 (FOURTEEN) DAYS. (Patient taking differently: Inject 150 mg into the skin every 14 (fourteen) days.) 2 mL 11   amLODipine (NORVASC) 10 MG tablet TAKE 1 TABLET BY MOUTH DAILY (Patient taking differently: Take 10 mg by mouth daily.) 90 tablet 3   amphetamine-dextroamphetamine (ADDERALL) 20 MG tablet Take 1 tablet by mouth in the morning, 1 tablet at noon, and 1 tablet at 4pm. 270 tablet 0   FLUoxetine (PROZAC) 40 MG capsule Take 1 capsule ('40mg'$ ) by mouth once daily 30 capsule 1   hydrochlorothiazide (MICROZIDE) 12.5 MG capsule TAKE 1 CAPSULE BY MOUTH DAILY 90 capsule 3   levothyroxine (SYNTHROID) 75 MCG tablet TAKE 1 TABLET BY MOUTH ONCE DAILY 90 tablet 0   LORazepam (ATIVAN) 1 MG tablet TAKE 1 TABLET FOUR TIMES DAILY AS NEEDED.  PER DOCTOR: MAKE APPT NOW! (Patient taking differently: Take 1 mg by mouth 4 (four) times daily as needed for anxiety.) 120 tablet 1   pantoprazole (PROTONIX) 40 MG tablet TAKE 1 TABLET (40 MG TOTAL) BY MOUTH  DAILY. 90 tablet 3   prasugrel (EFFIENT) 10 MG TABS tablet Take 1 tablet (10 mg total) by mouth daily. 30 tablet 0   tadalafil (CIALIS) 5 MG tablet Take 1 tablet (5 mg total) by mouth daily. Take 3-5 times a week for penile rehabilitation. May take up to '20mg'$  as needed for intercourse. 30 tablet 2   aspirin EC 81 MG tablet Take 81 mg by mouth daily. Swallow whole. (Patient not taking: Reported on 06/06/2021)     benzonatate (TESSALON) 100 MG capsule Take 1 capsule (100 mg total) by mouth 3 (three) times daily as needed for cough. 30 capsule 0   lidocaine (XYLOCAINE) 5 % ointment Apply 1 application topically as needed. (Patient taking differently: Apply 1 application topically as needed for mild pain.) 35.44 g 0   oxyCODONE-acetaminophen (PERCOCET/ROXICET) 5-325 MG tablet Take 1 tablet by mouth every 6 (six) hours as needed for moderate pain. 15 tablet 0   Vilazodone HCl (VIIBRYD) 40 MG TABS Take 1/2 tablet by mouth daily with a full meal for 7 days then increase to 1 tablet daily (Patient taking differently: Take 20 mg by mouth daily.) 30 tablet 0   Current Facility-Administered Medications  Medication Dose Route Frequency Provider Last Rate Last Admin   ticagrelor (BRILINTA) tablet 90 mg  90 mg Oral BID Cherre Robins, MD         ROS:  See HPI  BP 127/79 (BP Location: Left Arm, Patient Position: Sitting, Cuff Size: Normal)   Pulse 65   Temp 97.8 F (36.6 C) (Temporal)   Resp 16   Ht '5\' 7"'$  (1.702 m)   Wt 148 lb (67.1 kg)   SpO2 98%   BMI 23.18 kg/m   Physical Exam:  General appearance: Awake, alert in no apparent distress Neurologic: Alert and oriented x4, tongue midline, face symmetric, grip strength 5/5 bilaterally Cardiac: Heart rate and rhythm are regular. No carotid bruits. Respirations: Nonlabored Incision: Well approximated . Subcutaneous tissue soft to palpation   Summary:  Right Carotid: Patent stent.   Left Carotid: Velocities in the left ICA are consistent with a  1-39%  stenosis. Minimal flow noted in the ECA.   Vertebrals:  Bilateral vertebral arteries demonstrate antegrade flow.  Right vertebral waveform is tardus parvus indicating proximal obstruction.  Subclavians: Normal flow hemodynamics were seen in bilateral subclavian arteries.   *See table(s) above for measurements and observations.      Preliminary    Assessment/Plan:  This is a 70 y.o. male who is s/p:right TCAR. Stent patent. Neuro intact. Symptom-free. We discussed duration of therapy with Effient.  I discussed this with Dr. Stanford Breed.  The patient has the option of discontinuing Effient at this point in time.  He should continue aspirin 81 mg daily.  He verbalizes understanding.  Follow-up in 1 month with carotid duplex  Risa Grill, PA-C [facility] [phone]  Clinic MD: Dr. Stanford Breed

## 2021-06-07 ENCOUNTER — Other Ambulatory Visit: Payer: Self-pay

## 2021-06-07 DIAGNOSIS — I6523 Occlusion and stenosis of bilateral carotid arteries: Secondary | ICD-10-CM

## 2021-06-08 ENCOUNTER — Other Ambulatory Visit: Payer: Self-pay

## 2021-06-08 ENCOUNTER — Encounter: Payer: Self-pay | Admitting: Internal Medicine

## 2021-06-08 ENCOUNTER — Other Ambulatory Visit (HOSPITAL_BASED_OUTPATIENT_CLINIC_OR_DEPARTMENT_OTHER): Payer: Self-pay

## 2021-06-08 ENCOUNTER — Ambulatory Visit (INDEPENDENT_AMBULATORY_CARE_PROVIDER_SITE_OTHER): Payer: 59 | Admitting: Internal Medicine

## 2021-06-08 VITALS — BP 136/70 | HR 76 | Ht 67.0 in | Wt 150.0 lb

## 2021-06-08 DIAGNOSIS — R5382 Chronic fatigue, unspecified: Secondary | ICD-10-CM | POA: Diagnosis not present

## 2021-06-08 DIAGNOSIS — R059 Cough, unspecified: Secondary | ICD-10-CM

## 2021-06-08 DIAGNOSIS — U099 Post covid-19 condition, unspecified: Secondary | ICD-10-CM

## 2021-06-08 DIAGNOSIS — C61 Malignant neoplasm of prostate: Secondary | ICD-10-CM | POA: Diagnosis not present

## 2021-06-08 DIAGNOSIS — G9332 Myalgic encephalomyelitis/chronic fatigue syndrome: Secondary | ICD-10-CM

## 2021-06-08 MED ORDER — AZITHROMYCIN 250 MG PO TABS
ORAL_TABLET | ORAL | 0 refills | Status: AC
  Filled 2021-06-08: qty 6, 5d supply, fill #0

## 2021-06-08 MED ORDER — HYDROCODONE BIT-HOMATROP MBR 5-1.5 MG/5ML PO SOLN
5.0000 mL | Freq: Three times a day (TID) | ORAL | 0 refills | Status: DC | PRN
  Filled 2021-06-08: qty 120, 8d supply, fill #0

## 2021-06-08 MED ORDER — METHYLPREDNISOLONE 4 MG PO TBPK
ORAL_TABLET | ORAL | 0 refills | Status: DC
  Filled 2021-06-08: qty 21, 6d supply, fill #0

## 2021-06-08 NOTE — Patient Instructions (Signed)
Take Medrol dose pack as directed. Take Zithromax as directed. Hycodan if needed for cough. Labs drawn and pending.

## 2021-06-08 NOTE — Progress Notes (Signed)
   Subjective:    Patient ID: [name], male    DOB: [birth year], 70 y.o.   MRN: [id number]  HPI 70 year old Male seen for fatigue and weight loss.Has issues with swallowing and has seen by Dr. Collene Mares. Had Covid-19 in August.  Apparently went to [facility] and was not wearing a mask.  Has not gotten energy back after Covid-19.  Records indicate last COVID-vaccine was September 2021.  In early August he underwent right transcarotid artery revascularization by Dr. Stanford Breed.  That went very smoothly.  He has a history of prostate cancer and is being treated at [facility].  Hemoglobin was 11.7 g in early August.  Hemoglobin is now 13.2 g.  Due to fatigue when checked TSH and it was normal at 1.04.  We also check vitamin B12 level and it was above normal at 1321.  Review of Systems Has productive cough at times.  Main issue is fatigue.     Objective:   Physical Exam Vital signs reviewed.  He is afebrile.  His weight is 150 pounds.  In July he weighed 149 pounds in this office.  Skin warm and dry.  No cervical adenopathy.  Pharynx clear.  TMs clear.  Neck is supple.  Chest clear to auscultation.  Cardiac exam: Regular rate and rhythm.  No lower extremity pitting edema.       Assessment & Plan:  Post COVID fatigue  Post COVID respiratory infection likely a secondary bacterial infection with cough and congestion  Plan: He will take Hycodan 1 teaspoon every 8 hours as needed for cough.  I have prescribed Medrol Dosepak 4 mg 6-day taper.  He will continue with Protonix 40 mg daily.  He has been prescribed a Zithromax Z-PAK and he will take that as directed.

## 2021-06-09 ENCOUNTER — Other Ambulatory Visit (HOSPITAL_BASED_OUTPATIENT_CLINIC_OR_DEPARTMENT_OTHER): Payer: Self-pay

## 2021-06-09 LAB — CBC WITH DIFFERENTIAL/PLATELET
Absolute Monocytes: 364 cells/uL (ref 200–950)
Basophils Absolute: 42 cells/uL (ref 0–200)
Basophils Relative: 1.2 %
Eosinophils Absolute: 214 cells/uL (ref 15–500)
Eosinophils Relative: 6.1 %
HCT: 38.4 % — ABNORMAL LOW (ref 38.5–50.0)
Hemoglobin: 13.2 g/dL (ref 13.2–17.1)
Lymphs Abs: 406 cells/uL — ABNORMAL LOW (ref 850–3900)
MCH: 31.4 pg (ref 27.0–33.0)
MCHC: 34.4 g/dL (ref 32.0–36.0)
MCV: 91.2 fL (ref 80.0–100.0)
MPV: 9.4 fL (ref 7.5–12.5)
Monocytes Relative: 10.4 %
Neutro Abs: 2475 cells/uL (ref 1500–7800)
Neutrophils Relative %: 70.7 %
Platelets: 163 10*3/uL (ref 140–400)
RBC: 4.21 10*6/uL (ref 4.20–5.80)
RDW: 13.2 % (ref 11.0–15.0)
Total Lymphocyte: 11.6 %
WBC: 3.5 10*3/uL — ABNORMAL LOW (ref 3.8–10.8)

## 2021-06-09 LAB — COMPLETE METABOLIC PANEL WITH GFR
AG Ratio: 2.1 (calc) (ref 1.0–2.5)
ALT: 20 U/L (ref 9–46)
AST: 19 U/L (ref 10–35)
Albumin: 4.1 g/dL (ref 3.6–5.1)
Alkaline phosphatase (APISO): 64 U/L (ref 35–144)
BUN: 17 mg/dL (ref 7–25)
CO2: 25 mmol/L (ref 20–32)
Calcium: 9.2 mg/dL (ref 8.6–10.3)
Chloride: 104 mmol/L (ref 98–110)
Creat: 1.04 mg/dL (ref 0.70–1.35)
Globulin: 2 g/dL (calc) (ref 1.9–3.7)
Glucose, Bld: 118 mg/dL — ABNORMAL HIGH (ref 65–99)
Potassium: 3.8 mmol/L (ref 3.5–5.3)
Sodium: 140 mmol/L (ref 135–146)
Total Bilirubin: 0.3 mg/dL (ref 0.2–1.2)
Total Protein: 6.1 g/dL (ref 6.1–8.1)
eGFR: 78 mL/min/{1.73_m2} (ref >=60)

## 2021-06-09 LAB — IRON, TOTAL/TOTAL IRON BINDING CAP
%SAT: 27 % (calc) (ref 20–48)
Iron: 89 ug/dL (ref 50–180)
TIBC: 326 mcg/dL (calc) (ref 250–425)

## 2021-06-09 LAB — TSH: TSH: 1.04 mIU/L (ref 0.40–4.50)

## 2021-06-09 LAB — VITAMIN B12: Vitamin B-12: 1321 pg/mL — ABNORMAL HIGH (ref 200–1100)

## 2021-06-13 NOTE — Progress Notes (Signed)
Patient decided not to get xray.

## 2021-06-15 DIAGNOSIS — M25511 Pain in right shoulder: Secondary | ICD-10-CM | POA: Diagnosis not present

## 2021-06-19 ENCOUNTER — Other Ambulatory Visit (HOSPITAL_BASED_OUTPATIENT_CLINIC_OR_DEPARTMENT_OTHER): Payer: Self-pay

## 2021-06-22 ENCOUNTER — Other Ambulatory Visit (HOSPITAL_BASED_OUTPATIENT_CLINIC_OR_DEPARTMENT_OTHER): Payer: Self-pay

## 2021-06-30 ENCOUNTER — Other Ambulatory Visit (HOSPITAL_BASED_OUTPATIENT_CLINIC_OR_DEPARTMENT_OTHER): Payer: Self-pay

## 2021-06-30 MED ORDER — TRINTELLIX 5 MG PO TABS
ORAL_TABLET | Freq: Every day | ORAL | 0 refills | Status: DC
  Filled 2021-06-30: qty 30, 30d supply, fill #0

## 2021-07-06 ENCOUNTER — Other Ambulatory Visit (HOSPITAL_BASED_OUTPATIENT_CLINIC_OR_DEPARTMENT_OTHER): Payer: Self-pay

## 2021-07-11 ENCOUNTER — Other Ambulatory Visit (HOSPITAL_BASED_OUTPATIENT_CLINIC_OR_DEPARTMENT_OTHER): Payer: Self-pay

## 2021-07-11 ENCOUNTER — Ambulatory Visit: Payer: 59 | Attending: Internal Medicine

## 2021-07-11 DIAGNOSIS — Z23 Encounter for immunization: Secondary | ICD-10-CM

## 2021-07-11 MED ORDER — INFLUENZA VAC A&B SA ADJ QUAD 0.5 ML IM PRSY
PREFILLED_SYRINGE | INTRAMUSCULAR | 0 refills | Status: DC
  Filled 2021-07-11: qty 0.5, 1d supply, fill #0

## 2021-07-11 NOTE — Progress Notes (Signed)
   Covid-19 Vaccination Clinic  Name:  JAIVYN GULLA    MRN: [id number] DOB: 24-Jun-1951  07/11/2021  Mr. Abe was observed post Covid-19 immunization for 15 minutes without incident. He was provided with Vaccine Information Sheet and instruction to access the V-Safe system.   Mr. Dicioccio was instructed to call 911 with any severe reactions post vaccine: Difficulty breathing  Swelling of face and throat  A fast heartbeat  A bad rash all over body  Dizziness and weakness

## 2021-07-18 ENCOUNTER — Other Ambulatory Visit (HOSPITAL_BASED_OUTPATIENT_CLINIC_OR_DEPARTMENT_OTHER): Payer: Self-pay

## 2021-07-18 MED ORDER — LORAZEPAM 1 MG PO TABS
ORAL_TABLET | ORAL | 0 refills | Status: DC
  Filled 2021-07-18: qty 120, 30d supply, fill #0

## 2021-07-20 ENCOUNTER — Other Ambulatory Visit (HOSPITAL_BASED_OUTPATIENT_CLINIC_OR_DEPARTMENT_OTHER): Payer: Self-pay

## 2021-07-21 ENCOUNTER — Other Ambulatory Visit (HOSPITAL_BASED_OUTPATIENT_CLINIC_OR_DEPARTMENT_OTHER): Payer: Self-pay

## 2021-07-21 MED ORDER — TRINTELLIX 10 MG PO TABS
ORAL_TABLET | Freq: Every day | ORAL | 0 refills | Status: DC
  Filled 2021-07-24: qty 30, 30d supply, fill #0

## 2021-07-21 MED ORDER — PFIZER COVID-19 VAC BIVALENT 30 MCG/0.3ML IM SUSP
INTRAMUSCULAR | 0 refills | Status: DC
  Filled 2021-07-21: qty 0.3, 1d supply, fill #0

## 2021-07-24 ENCOUNTER — Other Ambulatory Visit: Payer: Self-pay | Admitting: Internal Medicine

## 2021-07-24 ENCOUNTER — Other Ambulatory Visit (HOSPITAL_BASED_OUTPATIENT_CLINIC_OR_DEPARTMENT_OTHER): Payer: Self-pay

## 2021-07-24 MED ORDER — AMLODIPINE BESYLATE 10 MG PO TABS
ORAL_TABLET | Freq: Every day | ORAL | 3 refills | Status: DC
  Filled 2021-07-24: qty 90, 90d supply, fill #0
  Filled 2021-10-27: qty 90, 90d supply, fill #1
  Filled 2022-02-19: qty 90, 90d supply, fill #2
  Filled 2022-02-27 – 2022-05-14 (×2): qty 90, 90d supply, fill #3

## 2021-08-03 DIAGNOSIS — R972 Elevated prostate specific antigen [PSA]: Secondary | ICD-10-CM | POA: Diagnosis not present

## 2021-08-03 DIAGNOSIS — C61 Malignant neoplasm of prostate: Secondary | ICD-10-CM | POA: Diagnosis not present

## 2021-08-07 ENCOUNTER — Other Ambulatory Visit: Payer: Self-pay

## 2021-08-07 ENCOUNTER — Ambulatory Visit (INDEPENDENT_AMBULATORY_CARE_PROVIDER_SITE_OTHER): Payer: 59 | Admitting: Dermatology

## 2021-08-07 ENCOUNTER — Other Ambulatory Visit (HOSPITAL_BASED_OUTPATIENT_CLINIC_OR_DEPARTMENT_OTHER): Payer: Self-pay

## 2021-08-07 DIAGNOSIS — D485 Neoplasm of uncertain behavior of skin: Secondary | ICD-10-CM

## 2021-08-07 DIAGNOSIS — Z840 Family history of diseases of the skin and subcutaneous tissue: Secondary | ICD-10-CM

## 2021-08-07 DIAGNOSIS — Z1283 Encounter for screening for malignant neoplasm of skin: Secondary | ICD-10-CM

## 2021-08-07 DIAGNOSIS — D229 Melanocytic nevi, unspecified: Secondary | ICD-10-CM

## 2021-08-07 DIAGNOSIS — Z808 Family history of malignant neoplasm of other organs or systems: Secondary | ICD-10-CM

## 2021-08-07 DIAGNOSIS — L989 Disorder of the skin and subcutaneous tissue, unspecified: Secondary | ICD-10-CM | POA: Diagnosis not present

## 2021-08-07 HISTORY — DX: Melanocytic nevi, unspecified: D22.9

## 2021-08-10 ENCOUNTER — Telehealth: Payer: Self-pay | Admitting: *Deleted

## 2021-08-10 ENCOUNTER — Other Ambulatory Visit (HOSPITAL_BASED_OUTPATIENT_CLINIC_OR_DEPARTMENT_OTHER): Payer: Self-pay

## 2021-08-10 MED ORDER — LORAZEPAM 1 MG PO TABS
ORAL_TABLET | ORAL | 1 refills | Status: DC
  Filled 2021-09-12: qty 270, 90d supply, fill #0
  Filled 2022-01-22: qty 270, 90d supply, fill #1

## 2021-08-10 MED ORDER — TRINTELLIX 10 MG PO TABS
ORAL_TABLET | Freq: Every day | ORAL | 4 refills | Status: DC
  Filled 2021-08-17 – 2021-08-23 (×2): qty 90, 90d supply, fill #0

## 2021-08-10 MED ORDER — AMPHETAMINE-DEXTROAMPHETAMINE 20 MG PO TABS
ORAL_TABLET | ORAL | 0 refills | Status: DC
  Filled 2021-08-10 – 2021-08-14 (×2): qty 270, 90d supply, fill #0

## 2021-08-10 NOTE — Telephone Encounter (Signed)
-----   Message from Lavonna Monarch, MD sent at 08/10/2021  6:34 AM EST ----- Although this is not considered to be an actual melanoma, the pathologist recommends conservative reexcision so please schedule a routine visit for wider shave.

## 2021-08-10 NOTE — Telephone Encounter (Signed)
Left message for patient to return our phone call for pathology results.

## 2021-08-11 ENCOUNTER — Other Ambulatory Visit (HOSPITAL_BASED_OUTPATIENT_CLINIC_OR_DEPARTMENT_OTHER): Payer: Self-pay

## 2021-08-14 ENCOUNTER — Other Ambulatory Visit (HOSPITAL_BASED_OUTPATIENT_CLINIC_OR_DEPARTMENT_OTHER): Payer: Self-pay

## 2021-08-17 ENCOUNTER — Other Ambulatory Visit (HOSPITAL_BASED_OUTPATIENT_CLINIC_OR_DEPARTMENT_OTHER): Payer: Self-pay

## 2021-08-17 ENCOUNTER — Telehealth: Payer: Self-pay

## 2021-08-17 NOTE — Telephone Encounter (Signed)
Phone call to patient with his pathology results. Voicemail left for patient to give the office a call back.  ?

## 2021-08-17 NOTE — Telephone Encounter (Signed)
-----   Message from Lavonna Monarch, MD sent at 08/10/2021  6:34 AM EST ----- Although this is not considered to be an actual melanoma, the pathologist recommends conservative reexcision so please schedule a routine visit for wider shave.

## 2021-08-23 ENCOUNTER — Other Ambulatory Visit (HOSPITAL_BASED_OUTPATIENT_CLINIC_OR_DEPARTMENT_OTHER): Payer: Self-pay

## 2021-08-25 ENCOUNTER — Encounter: Payer: Self-pay | Admitting: Dermatology

## 2021-08-25 NOTE — Progress Notes (Signed)
   Follow-Up Visit   Subjective  [name] is a 70 y.o. male who presents for the following: Annual Exam (Patient here today for yearly skin check, no new concerns. No personal history or family history of atypical moles, melanoma or non mole skin cancer. ).  Annual skin examination. Location:  Duration:  Quality:  Associated Signs/Symptoms: Modifying Factors:  Severity:  Timing: Context:   Objective  Well appearing patient in no apparent distress; mood and affect are within normal limits. Torso - Posterior (Back) Full body skin examination: 1 atypical pigmented lesion mid back will be biopsied.  No sign of nonmelanoma skin cancer.  Mid Back 2 toned 5 mm gray-brown macule with dermoscopic atypia       A full examination was performed including scalp, head, eyes, ears, nose, lips, neck, chest, axillae, abdomen, back, buttocks, bilateral upper extremities, bilateral lower extremities, hands, feet, fingers, toes, fingernails, and toenails. All findings within normal limits unless otherwise noted below.  Areas beneath undergarments not fully examined.   Assessment & Plan    Encounter for screening for malignant neoplasm of skin Torso - Posterior (Back)  Annual skin examination.  Neoplasm of uncertain behavior of skin Mid Back  Skin / nail biopsy Type of biopsy: tangential   Informed consent: discussed and consent obtained   Timeout: patient name, date of birth, surgical site, and procedure verified   Procedure prep:  Patient was prepped and draped in usual sterile fashion (Non sterile) Prep type:  Chlorhexidine Anesthesia: the lesion was anesthetized in a standard fashion   Anesthetic:  1% lidocaine w/ epinephrine 1-100,000 local infiltration Instrument used: flexible razor blade   Outcome: patient tolerated procedure well   Post-procedure details: wound care instructions given    Specimen 1 - Surgical pathology Differential Diagnosis: r/o atypia  Check  Margins: No      I, Lavonna Monarch, MD, have reviewed all documentation for this visit.  The documentation on 08/25/21 for the exam, diagnosis, procedures, and orders are all accurate and complete.

## 2021-08-29 NOTE — Telephone Encounter (Signed)
-----   Message from Lavonna Monarch, MD sent at 08/10/2021  6:34 AM EST ----- Although this is not considered to be an actual melanoma, the pathologist recommends conservative reexcision so please schedule a routine visit for wider shave.

## 2021-09-08 ENCOUNTER — Other Ambulatory Visit: Payer: Self-pay | Admitting: Internal Medicine

## 2021-09-08 ENCOUNTER — Other Ambulatory Visit (HOSPITAL_BASED_OUTPATIENT_CLINIC_OR_DEPARTMENT_OTHER): Payer: Self-pay

## 2021-09-08 MED ORDER — LEVOTHYROXINE SODIUM 75 MCG PO TABS
ORAL_TABLET | Freq: Every day | ORAL | 0 refills | Status: DC
  Filled 2021-09-08: qty 90, 90d supply, fill #0

## 2021-09-12 ENCOUNTER — Other Ambulatory Visit (HOSPITAL_BASED_OUTPATIENT_CLINIC_OR_DEPARTMENT_OTHER): Payer: Self-pay

## 2021-09-18 DIAGNOSIS — Z01812 Encounter for preprocedural laboratory examination: Secondary | ICD-10-CM | POA: Diagnosis not present

## 2021-09-18 DIAGNOSIS — Z01810 Encounter for preprocedural cardiovascular examination: Secondary | ICD-10-CM | POA: Diagnosis not present

## 2021-09-18 DIAGNOSIS — I4510 Unspecified right bundle-branch block: Secondary | ICD-10-CM | POA: Diagnosis not present

## 2021-09-18 DIAGNOSIS — C61 Malignant neoplasm of prostate: Secondary | ICD-10-CM | POA: Diagnosis not present

## 2021-09-21 ENCOUNTER — Other Ambulatory Visit (HOSPITAL_BASED_OUTPATIENT_CLINIC_OR_DEPARTMENT_OTHER): Payer: Self-pay

## 2021-09-21 DIAGNOSIS — Z01810 Encounter for preprocedural cardiovascular examination: Secondary | ICD-10-CM | POA: Diagnosis not present

## 2021-09-21 DIAGNOSIS — I4510 Unspecified right bundle-branch block: Secondary | ICD-10-CM | POA: Diagnosis not present

## 2021-09-29 ENCOUNTER — Other Ambulatory Visit (HOSPITAL_BASED_OUTPATIENT_CLINIC_OR_DEPARTMENT_OTHER): Payer: Self-pay

## 2021-10-02 ENCOUNTER — Other Ambulatory Visit (HOSPITAL_BASED_OUTPATIENT_CLINIC_OR_DEPARTMENT_OTHER): Payer: Self-pay

## 2021-10-06 ENCOUNTER — Other Ambulatory Visit (HOSPITAL_BASED_OUTPATIENT_CLINIC_OR_DEPARTMENT_OTHER): Payer: Self-pay

## 2021-10-06 MED ORDER — SERTRALINE HCL 100 MG PO TABS
ORAL_TABLET | ORAL | 3 refills | Status: DC
  Filled 2021-10-06: qty 60, 36d supply, fill #0
  Filled 2021-11-29: qty 60, 36d supply, fill #1
  Filled 2022-03-08: qty 60, 36d supply, fill #2
  Filled 2022-03-26 – 2022-04-05 (×2): qty 60, 36d supply, fill #3

## 2021-10-23 DIAGNOSIS — C61 Malignant neoplasm of prostate: Secondary | ICD-10-CM | POA: Diagnosis not present

## 2021-10-27 ENCOUNTER — Other Ambulatory Visit (HOSPITAL_BASED_OUTPATIENT_CLINIC_OR_DEPARTMENT_OTHER): Payer: Self-pay

## 2021-11-02 ENCOUNTER — Ambulatory Visit (INDEPENDENT_AMBULATORY_CARE_PROVIDER_SITE_OTHER): Payer: 59 | Admitting: Dermatology

## 2021-11-02 ENCOUNTER — Other Ambulatory Visit: Payer: Self-pay

## 2021-11-02 DIAGNOSIS — L821 Other seborrheic keratosis: Secondary | ICD-10-CM

## 2021-11-02 DIAGNOSIS — L570 Actinic keratosis: Secondary | ICD-10-CM

## 2021-11-02 DIAGNOSIS — D229 Melanocytic nevi, unspecified: Secondary | ICD-10-CM

## 2021-11-02 NOTE — Patient Instructions (Signed)

## 2021-11-06 ENCOUNTER — Other Ambulatory Visit (HOSPITAL_BASED_OUTPATIENT_CLINIC_OR_DEPARTMENT_OTHER): Payer: Self-pay

## 2021-11-07 ENCOUNTER — Other Ambulatory Visit (HOSPITAL_BASED_OUTPATIENT_CLINIC_OR_DEPARTMENT_OTHER): Payer: Self-pay

## 2021-11-07 DIAGNOSIS — Z923 Personal history of irradiation: Secondary | ICD-10-CM | POA: Diagnosis not present

## 2021-11-07 DIAGNOSIS — Z8546 Personal history of malignant neoplasm of prostate: Secondary | ICD-10-CM | POA: Diagnosis not present

## 2021-11-07 DIAGNOSIS — R350 Frequency of micturition: Secondary | ICD-10-CM | POA: Diagnosis not present

## 2021-11-07 DIAGNOSIS — N393 Stress incontinence (female) (male): Secondary | ICD-10-CM | POA: Diagnosis not present

## 2021-11-07 MED ORDER — SOLIFENACIN SUCCINATE 5 MG PO TABS
5.0000 mg | ORAL_TABLET | Freq: Every day | ORAL | 5 refills | Status: DC
  Filled 2021-11-07: qty 30, 30d supply, fill #0
  Filled 2021-12-04: qty 30, 30d supply, fill #1
  Filled 2022-01-05: qty 30, 30d supply, fill #2

## 2021-11-09 DIAGNOSIS — N393 Stress incontinence (female) (male): Secondary | ICD-10-CM | POA: Diagnosis not present

## 2021-11-09 DIAGNOSIS — M6289 Other specified disorders of muscle: Secondary | ICD-10-CM | POA: Diagnosis not present

## 2021-11-14 ENCOUNTER — Other Ambulatory Visit (HOSPITAL_BASED_OUTPATIENT_CLINIC_OR_DEPARTMENT_OTHER): Payer: Self-pay

## 2021-11-14 DIAGNOSIS — M25511 Pain in right shoulder: Secondary | ICD-10-CM | POA: Diagnosis not present

## 2021-11-14 MED ORDER — AMPHETAMINE-DEXTROAMPHETAMINE 20 MG PO TABS
ORAL_TABLET | ORAL | 0 refills | Status: DC
  Filled 2021-11-14: qty 180, 90d supply, fill #0

## 2021-11-15 ENCOUNTER — Other Ambulatory Visit (HOSPITAL_BASED_OUTPATIENT_CLINIC_OR_DEPARTMENT_OTHER): Payer: Self-pay

## 2021-11-15 DIAGNOSIS — M6289 Other specified disorders of muscle: Secondary | ICD-10-CM | POA: Diagnosis not present

## 2021-11-15 DIAGNOSIS — N393 Stress incontinence (female) (male): Secondary | ICD-10-CM | POA: Diagnosis not present

## 2021-11-16 ENCOUNTER — Other Ambulatory Visit (HOSPITAL_BASED_OUTPATIENT_CLINIC_OR_DEPARTMENT_OTHER): Payer: Self-pay

## 2021-11-17 ENCOUNTER — Other Ambulatory Visit (HOSPITAL_BASED_OUTPATIENT_CLINIC_OR_DEPARTMENT_OTHER): Payer: Self-pay

## 2021-11-20 ENCOUNTER — Other Ambulatory Visit (HOSPITAL_BASED_OUTPATIENT_CLINIC_OR_DEPARTMENT_OTHER): Payer: Self-pay

## 2021-11-25 ENCOUNTER — Encounter: Payer: Self-pay | Admitting: Dermatology

## 2021-11-25 NOTE — Progress Notes (Signed)
° °  Follow-Up Visit   Subjective  [name] is a 71 y.o. male who presents for the following: Procedure (Patient here today for wider shave on mid back).  Atypical pigmented lesion back, returns for wider removal Location:  Duration:  Quality:  Associated Signs/Symptoms: Modifying Factors:  Severity:  Timing: Context:   Objective  Well appearing patient in no apparent distress; mood and affect are within normal limits. Mid Back Biopsy showed atypical melanocytic proliferation with uncertain margins.  Explained this to patient and to his comfortable with obtaining a wider deeper shave.    All skin waist up examined.   Assessment & Plan    Atypical mole Mid Back  Biopsy site identified by nurse and me.  1.5 mm margins marked and deep saucerization done  Epidermal / dermal shaving - Mid Back  Lesion diameter (cm):  1.7 Informed consent: discussed and consent obtained   Timeout: patient name, date of birth, surgical site, and procedure verified   Procedure prep:  Patient was prepped and draped in usual sterile fashion Prep type:  Chlorhexidine Anesthesia: the lesion was anesthetized in a standard fashion   Anesthetic:  1% lidocaine w/ epinephrine 1-100,000 local infiltration Instrument used: DermaBlade   Hemostasis achieved with: aluminum chloride and ferric subsulfate   Outcome: patient tolerated procedure well   Post-procedure details: sterile dressing applied and wound care instructions given   Dressing type: petrolatum gauze, petrolatum and bandage    Specimen 1 - Surgical pathology Differential Diagnosis: R/O Atypia - wider shave [id number] Check Margins: No      I, Lavonna Monarch, MD, have reviewed all documentation for this visit.  The documentation on 11/25/21 for the exam, diagnosis, procedures, and orders are all accurate and complete.

## 2021-11-29 ENCOUNTER — Other Ambulatory Visit (HOSPITAL_BASED_OUTPATIENT_CLINIC_OR_DEPARTMENT_OTHER): Payer: Self-pay

## 2021-11-30 DIAGNOSIS — N393 Stress incontinence (female) (male): Secondary | ICD-10-CM | POA: Diagnosis not present

## 2021-11-30 DIAGNOSIS — M6289 Other specified disorders of muscle: Secondary | ICD-10-CM | POA: Diagnosis not present

## 2021-12-04 ENCOUNTER — Other Ambulatory Visit (HOSPITAL_BASED_OUTPATIENT_CLINIC_OR_DEPARTMENT_OTHER): Payer: Self-pay

## 2021-12-04 ENCOUNTER — Other Ambulatory Visit: Payer: Self-pay | Admitting: Internal Medicine

## 2021-12-05 ENCOUNTER — Other Ambulatory Visit (HOSPITAL_BASED_OUTPATIENT_CLINIC_OR_DEPARTMENT_OTHER): Payer: Self-pay

## 2021-12-05 MED ORDER — LEVOTHYROXINE SODIUM 75 MCG PO TABS
ORAL_TABLET | Freq: Every day | ORAL | 1 refills | Status: DC
  Filled 2021-12-05: qty 90, 90d supply, fill #0
  Filled 2022-03-08: qty 90, 90d supply, fill #1

## 2021-12-05 NOTE — Telephone Encounter (Signed)
LVM to CB to schedule CPE due after 04/05/2022, first available is 04/30/2022 ?

## 2021-12-20 ENCOUNTER — Other Ambulatory Visit (HOSPITAL_BASED_OUTPATIENT_CLINIC_OR_DEPARTMENT_OTHER): Payer: Self-pay

## 2021-12-20 DIAGNOSIS — N3946 Mixed incontinence: Secondary | ICD-10-CM | POA: Diagnosis not present

## 2021-12-20 DIAGNOSIS — N5231 Erectile dysfunction following radical prostatectomy: Secondary | ICD-10-CM | POA: Diagnosis not present

## 2021-12-20 DIAGNOSIS — Z9079 Acquired absence of other genital organ(s): Secondary | ICD-10-CM | POA: Diagnosis not present

## 2021-12-20 DIAGNOSIS — Z8546 Personal history of malignant neoplasm of prostate: Secondary | ICD-10-CM | POA: Diagnosis not present

## 2021-12-20 MED ORDER — TADALAFIL 5 MG PO TABS
ORAL_TABLET | ORAL | 5 refills | Status: DC
  Filled 2021-12-20: qty 30, 30d supply, fill #0

## 2022-01-05 ENCOUNTER — Other Ambulatory Visit (HOSPITAL_BASED_OUTPATIENT_CLINIC_OR_DEPARTMENT_OTHER): Payer: Self-pay

## 2022-01-11 ENCOUNTER — Encounter: Payer: Self-pay | Admitting: Cardiovascular Disease

## 2022-01-12 NOTE — Telephone Encounter (Signed)
-  Called pt regarding mychart message ?-Pt state for the past week or two, he's been experiencing off and on palpations several times throughout the day. ?-He also report feeling lightheaded during episodes ?-Pt denies symptoms of feeling like he's going to pass out or black out. ? ?-Nurse scheduled an appointment for Monday 4/17 with Almyra Deforest, PA for further evaluations. Pt also made aware of ED precaution should any new symptoms develop or worsen.  ?  ? ? ?

## 2022-01-15 ENCOUNTER — Ambulatory Visit (INDEPENDENT_AMBULATORY_CARE_PROVIDER_SITE_OTHER): Payer: 59 | Admitting: Nurse Practitioner

## 2022-01-15 ENCOUNTER — Encounter: Payer: Self-pay | Admitting: Physician Assistant

## 2022-01-15 ENCOUNTER — Other Ambulatory Visit (HOSPITAL_BASED_OUTPATIENT_CLINIC_OR_DEPARTMENT_OTHER): Payer: Self-pay

## 2022-01-15 VITALS — BP 118/60 | HR 64 | Ht 70.0 in | Wt 149.4 lb

## 2022-01-15 DIAGNOSIS — I959 Hypotension, unspecified: Secondary | ICD-10-CM | POA: Diagnosis not present

## 2022-01-15 DIAGNOSIS — I2510 Atherosclerotic heart disease of native coronary artery without angina pectoris: Secondary | ICD-10-CM

## 2022-01-15 DIAGNOSIS — I6523 Occlusion and stenosis of bilateral carotid arteries: Secondary | ICD-10-CM | POA: Diagnosis not present

## 2022-01-15 MED ORDER — METOPROLOL TARTRATE 25 MG PO TABS
12.5000 mg | ORAL_TABLET | Freq: Two times a day (BID) | ORAL | 3 refills | Status: DC
  Filled 2022-01-15: qty 90, 90d supply, fill #0
  Filled 2022-04-18: qty 90, 90d supply, fill #1
  Filled 2022-07-16: qty 90, 90d supply, fill #2

## 2022-01-15 NOTE — Progress Notes (Signed)
?Cardiology Office Note:   ? ?Date:  01/15/2022  ? ?ID:  [name], DOB 1950-12-13, MRN [id number] ? ?PCP:  [name], MD ?  ?[facility] Providers ?Cardiologist:  Quay Burow, MD    ? ?Referring MD: [name], MD  ? ? ?Presents for complaint of dizziness and palpitations ? ?History of Present Illness:   ? ?[name] is a 71 y.o. male with a hx of ADHD, CAD, carotid artery stenosis, hypertension, hyperlipidemia intolerant of statins, hypothyroidism, history of prostate cancer and a history of TIA.  Patient had a history of CABG x5 by Dr. Roxan Hockey on 06/16/2008 with LIMA to LAD, SVG to D1, SVG to OM1 and OM 2, and SVG to PDA.  He had a cerebral angiogram in August 2009 due to a question of high-grade internal carotid artery stenosis read by radiologist, however this turned out to be left external carotid artery stenosis.  He did have high-grade ostial left internal mammary artery stenosis.  He has a history of squamous cell cancer at the base of his tongue in 2000 and underwent radiation therapy of head and neck.  Myoview in March 2013 was normal.  Carotid Doppler showed moderate severe right ICA stenosis.  Repeat Myoview in June 2017 showed a large reversible lateral perfusion defect consistent with ischemia.  Subsequent cardiac catheterization performed on 05/14/2016 revealed severe native and graft disease, occlusion of RCA graft and OM grafts, he had significant native left circumflex disease.  Dr. Roxan Hockey discussed with him regarding possibility of redo CABG although it was eventually determined he would be better served for attempted PCI and stenting of the native left circumflex vessel.  He eventually underwent DES to left circumflex into OM branch on 06/14/2016.  More recently, patient was seen by Dr. Alvester Chou on 11/23/2020 at which time he was doing well.  He had severe right carotid artery stenosis seen on Doppler on 04/11/2021 and subsequently underwent stenting by vascular surgery.   Carotid Doppler obtained on 06/06/2021 showed 1 to 39% left carotid artery stenosis, patent stent in the right carotid artery. ? ?[name] presents today for complaint of dizziness and palpitations.  He states that he is experiencing bouts of dizziness and lightheadedness that occur with walking and sitting. He describes palpitations as a empty feeling in his chest that occur with sudden force. Last Saturday he reports that he almost fell over after standing from a sitting position.  He is currently not on any new medications with the exception of Vesicare that was started about a month ago.  He discontinued his Adderall and also stopped his Cialis and Trintellix.  During visit patient was positive for orthostatic hypotension with 20 point difference in systolic from sitting to standing.  He denies any chest pain shortness of breath with complaints of palpitations. ? ?Orthostatic Readings During Visit: ? ?-Lying: 141/80 ?-Sitting:141/76 ?-Standing:121/73 ? ?Past Medical History:  ?Diagnosis Date  ? ADHD (attention deficit hyperactivity disorder)   ? Anxiety   ? Arthritis   ? "NECK, HANDS, KNEES" (06/14/2016)  ? Atypical mole 08/07/2021  ? Mid Back (Atypical Proliferation)  ? CAD (coronary artery disease)   ? Carotid artery stenosis   ? Complication of anesthesia   ? Reports that he had trouble swallowing post anesthesia- CABG  ? COPD (chronic obstructive pulmonary disease) ([place]) 12/11/2011  ? r/s mv - EF 72%; exercise capacity 13 METS; no exercised induced ischemic EKG changes  ? Depression, major   ? Dyspnea   ?  with exertion  ? GERD (gastroesophageal reflux disease)   ? HTN (hypertension), benign 04/16/2008  ? echo - EF >55%; no regional wall or valvular abnormalities  ? Hyperlipidemia   ? statin intolerant  ? Hypothyroidism   ? Migraine   ? "crippling headaches as a kid; silent migraines now, no pain, couple times/week" (06/14/2016)  ? Peripheral vascular disease ([place])   ? Prostate cancer ([place]) 04/2019  ? recent dx  - Dr Lawerance Bach  ? S/P angioplasty with stent 06/14/16 PCI & DES of high grade AV groove LCX and 1st OM  06/15/2016  ? Squamous cell cancer of tongue ([place]) ~ 2002  ? "[facility]"  ? Subclavian steal syndrome 06/17/2012  ? carotid doppler - R systolic brachial pressure 165mHg, L 1580mg; R subclavian artery - proxmial obsstruction w/ abnormal monophasic waveforms, R ECA known occlusive disease; L ECA narrowing w/ 70-99% diameter reductiona  ? TIA (transient ischemic attack) 2005  ? Viral hepatitis 1970s  ? "non specific"  ? ? ?Past Surgical History:  ?Procedure Laterality Date  ? BACK SURGERY    ? BIOPSY  02/20/2018  ? Procedure: BIOPSY;  Surgeon: MaJuanita CraverMD;  Location: [facility];  Service: Endoscopy;;  ? BIOPSY TONGUE  ~ 2002  ? "base of tongue and uvula"  ? CARDIAC CATHETERIZATION  04/26/2008  ? L circumflex 99% stenosed midportion straddling a marginal branch; RCA total priximally w/ grade 2 L to R collaterals; renal arteries widely patent; LAD 30% segmental proximal stenosis  ? CARDIAC CATHETERIZATION N/A 05/14/2016  ? Procedure: Left Heart Cath and Cors/Grafts Angiography;  Surgeon: JoLorretta HarpMD;  [facility];  Service: Cardiovascular;  Laterality: N/A;  ? CARDIAC CATHETERIZATION N/A 06/14/2016  ? Procedure: Coronary Stent Intervention;  Surgeon: JoLorretta HarpMD;  [facility];  Service: Cardiovascular;  Laterality: N/A;  ? [place]  [place]  ? [place]  ? COLONOSCOPY WITH PROPOFOL N/A 02/20/2018  ? Procedure: COLONOSCOPY WITH PROPOFOL;  Surgeon: MaJuanita CraverMD;  [facility];  Service: Endoscopy;  Laterality: N/A;  ? CORONARY ANGIOPLASTY WITH STENT PLACEMENT    ? CORONARY ARTERY BYPASS GRAFT  2009  ? x5; LIMA to LAd, sequential vein to first diagonal branch, first and second obtuse marginal branches; vein to PDA  ? ESOPHAGOGASTRODUODENOSCOPY (EGD) WITH PROPOFOL N/A 02/20/2018  ? Procedure: ESOPHAGOGASTRODUODENOSCOPY  (EGD) WITH PROPOFOL;  Surgeon: MaJuanita CraverMD;  [facility];  Service: Endoscopy;  Laterality: N/A;  ? INSERTION OF MESH N/A 10/29/2017  ? Procedure: INSERTION OF MESH;  Surgeon: TsDonnie MesaMD;  [facility] Service: General;  Laterality: N/A;  ? [place]  06/17/2019  ? LUMBAR 2 & 3   ? LUMBAR LAMINECTOMY/DECOMPRESSION MICRODISCECTOMY N/A 06/17/2019  ? Procedure: Lumbar two -Lumbar three decompression and disectomy;  Surgeon: BrMelina SchoolsMD;  Location: [facility] Service: Orthopedics;  Laterality: N/A;  3 hrs  ? POLYPECTOMY  02/20/2018  ? Procedure: POLYPECTOMY;  Surgeon: MaJuanita CraverMD;  [facility];  Service: Endoscopy;;  ? SHOULDER ARTHROSCOPY WITH SUBACROMIAL DECOMPRESSION AND OPEN ROTATOR C Left 09/28/2013  ? Procedure: LEFT SHOULDER ARTHROSCOPY WITH SUBACROMIAL DECOMPRESSION AND MINI OPEN ROTATOR CUFF REPAIR, OPEN DISTAL CLAVICLE RESECTION AND OPEN BICEP TENDODESIS ;  Surgeon: StAugustin SchoolingMD;  Location: [facility] Service: Orthopedics;  Laterality: Left;  ? TRANSCAROTID ARTERY REVASCULARIZATION?  Right 05/05/2021  ? Procedure: RIGHT TRANSCAROTID ARTERY REVASCULARIZATION;  Surgeon:  Cherre Robins, MD;  Location: [facility];  Service: Vascular;  Laterality: Right;  ? UMBILICAL HERNIA REPAIR N/A 10/29/2017  ? Procedure: UMBILICAL HERNIA REPAIR;  Surgeon: Donnie Mesa, MD;  Location: [facility];  Service: General;  Laterality: N/A;  ? ? ?Current Medications: ?Current Meds  ?Medication Sig  ? albuterol (VENTOLIN HFA) 108 (90 Base) MCG/ACT inhaler Inhale 2 puffs into the lungs every 6 (six) hours as needed for wheezing or shortness of breath.  ? Alirocumab (PRALUENT) 150 MG/ML SOAJ INJECT 150 MG INTO THE SKIN EVERY 14 (FOURTEEN) DAYS. (Patient taking differently: Inject 150 mg into the skin every 14 (fourteen) days.)  ? amLODipine (NORVASC) 10 MG tablet TAKE 1 TABLET BY MOUTH DAILY  ? amphetamine-dextroamphetamine (ADDERALL) 20 MG tablet Take 1 tablet by mouth 3 times daily  ?  aspirin EC 81 MG tablet Take 81 mg by mouth daily. Swallow whole.  ? COVID-19 mRNA bivalent vaccine, Pfizer, (PFIZER COVID-19 VAC BIVALENT) injection Inject into the muscle.  ? hydrochlorothiazide (

## 2022-01-15 NOTE — Patient Instructions (Addendum)
Medication Instructions:  ?START Metoprolol Tartrate (Lopressor) 12.5 mg twice daily ? ?*If you need a refill on your cardiac medications before your next appointment, please call your pharmacy* ? ?Lab Work: ?NONE ordered at this time of appointment  ? ?If you have labs (blood work) drawn today and your tests are completely normal, you will receive your results only by: ?MyChart Message (if you have MyChart) OR ?A paper copy in the mail ?If you have any lab test that is abnormal or we need to change your treatment, we will call you to review the results. ? ? ?Testing/Procedures: ?Your physician has requested that you have an echocardiogram. Echocardiography is a painless test that uses sound waves to create images of your heart. It provides your doctor with information about the size and shape of your heart and how well your heart?s chambers and valves are working. This procedure takes approximately one hour. There are no restrictions for this procedure. ? ?Please schedule for 2-3 weeks at [facility] office  ? ?Follow-Up: ?At [facility], you and your health needs are our priority.  As part of our continuing mission to provide you with exceptional heart care, we have created designated Provider Care Teams.  These Care Teams include your primary Cardiologist (physician) and Advanced Practice Providers (APPs -  Physician Assistants and Nurse Practitioners) who all work together to provide you with the care you need, when you need it. ? ?We recommend signing up for the patient portal called "MyChart".  Sign up information is provided on this After Visit Summary.  MyChart is used to connect with patients for Virtual Visits (Telemedicine).  Patients are able to view lab/test results, encounter notes, upcoming appointments, etc.  Non-urgent messages can be sent to your provider as well.   ?To learn more about what you can do with MyChart, go to [email or web address].   ? ?Your next appointment:   ?3 week(s) ? ?The  format for your next appointment:   ?In Person ? ?Provider:   ?Almyra Deforest, PA-C      ? ? ?Other Instructions ? ?Important Information About Sugar ? ? ? ? ? ? ?

## 2022-01-22 ENCOUNTER — Ambulatory Visit (HOSPITAL_BASED_OUTPATIENT_CLINIC_OR_DEPARTMENT_OTHER): Payer: 59

## 2022-01-22 ENCOUNTER — Other Ambulatory Visit (HOSPITAL_BASED_OUTPATIENT_CLINIC_OR_DEPARTMENT_OTHER): Payer: Self-pay

## 2022-01-22 DIAGNOSIS — I959 Hypotension, unspecified: Secondary | ICD-10-CM

## 2022-01-22 LAB — ECHOCARDIOGRAM COMPLETE
AR max vel: 2 cm2
AV Area VTI: 1.88 cm2
AV Area mean vel: 1.67 cm2
AV Mean grad: 5 mmHg
AV Peak grad: 9.1 mmHg
AV Vena cont: 0.16 cm
Ao pk vel: 1.51 m/s
Area-P 1/2: 3.76 cm2
Calc EF: 60.4 %
P 1/2 time: 531 msec
S' Lateral: 2.81 cm
Single Plane A2C EF: 62.1 %
Single Plane A4C EF: 58.7 %

## 2022-01-23 ENCOUNTER — Other Ambulatory Visit (HOSPITAL_BASED_OUTPATIENT_CLINIC_OR_DEPARTMENT_OTHER): Payer: Self-pay

## 2022-01-23 MED ORDER — AMPHETAMINE-DEXTROAMPHETAMINE 20 MG PO TABS
20.0000 mg | ORAL_TABLET | Freq: Three times a day (TID) | ORAL | 0 refills | Status: DC
  Filled 2022-01-23: qty 270, 90d supply, fill #0

## 2022-01-31 ENCOUNTER — Other Ambulatory Visit (HOSPITAL_BASED_OUTPATIENT_CLINIC_OR_DEPARTMENT_OTHER): Payer: Self-pay

## 2022-01-31 MED ORDER — MIRTAZAPINE 30 MG PO TABS
ORAL_TABLET | ORAL | 0 refills | Status: DC
  Filled 2022-01-31: qty 90, 90d supply, fill #0

## 2022-02-01 ENCOUNTER — Other Ambulatory Visit (HOSPITAL_BASED_OUTPATIENT_CLINIC_OR_DEPARTMENT_OTHER): Payer: Self-pay

## 2022-02-05 ENCOUNTER — Other Ambulatory Visit (HOSPITAL_BASED_OUTPATIENT_CLINIC_OR_DEPARTMENT_OTHER): Payer: Self-pay

## 2022-02-05 ENCOUNTER — Other Ambulatory Visit: Payer: Self-pay | Admitting: Cardiovascular Disease

## 2022-02-06 ENCOUNTER — Other Ambulatory Visit (HOSPITAL_BASED_OUTPATIENT_CLINIC_OR_DEPARTMENT_OTHER): Payer: Self-pay

## 2022-02-06 MED ORDER — SHINGRIX 50 MCG/0.5ML IM SUSR
INTRAMUSCULAR | 1 refills | Status: DC
  Filled 2022-02-06: qty 1, 1d supply, fill #0
  Filled 2022-03-26 – 2022-04-20 (×4): qty 1, 1d supply, fill #1

## 2022-02-06 MED ORDER — PRALUENT 150 MG/ML ~~LOC~~ SOAJ
SUBCUTANEOUS | 11 refills | Status: DC
Start: 2022-02-06 — End: 2022-10-26
  Filled 2022-02-06: qty 2, 28d supply, fill #0
  Filled 2022-03-08: qty 2, 28d supply, fill #1
  Filled 2022-03-26 – 2022-03-30 (×2): qty 2, 28d supply, fill #2
  Filled 2022-04-18 – 2022-05-14 (×2): qty 2, 28d supply, fill #3
  Filled 2022-06-05: qty 2, 28d supply, fill #4
  Filled 2022-07-02: qty 2, 28d supply, fill #5
  Filled 2022-07-24: qty 2, 28d supply, fill #6
  Filled 2022-09-07: qty 2, 28d supply, fill #7
  Filled 2022-09-26 – 2022-09-29 (×4): qty 2, 28d supply, fill #8
  Filled ????-??-??: fill #8

## 2022-02-20 ENCOUNTER — Ambulatory Visit: Payer: 59

## 2022-02-20 ENCOUNTER — Other Ambulatory Visit (HOSPITAL_BASED_OUTPATIENT_CLINIC_OR_DEPARTMENT_OTHER): Payer: Self-pay

## 2022-02-23 ENCOUNTER — Encounter: Payer: Self-pay | Admitting: Physician Assistant

## 2022-02-23 ENCOUNTER — Ambulatory Visit (INDEPENDENT_AMBULATORY_CARE_PROVIDER_SITE_OTHER): Payer: 59 | Admitting: Physician Assistant

## 2022-02-23 VITALS — BP 116/58 | HR 46 | Ht 70.0 in | Wt 152.0 lb

## 2022-02-23 DIAGNOSIS — I25810 Atherosclerosis of coronary artery bypass graft(s) without angina pectoris: Secondary | ICD-10-CM

## 2022-02-23 DIAGNOSIS — E785 Hyperlipidemia, unspecified: Secondary | ICD-10-CM | POA: Diagnosis not present

## 2022-02-23 DIAGNOSIS — R42 Dizziness and giddiness: Secondary | ICD-10-CM | POA: Diagnosis not present

## 2022-02-23 DIAGNOSIS — R001 Bradycardia, unspecified: Secondary | ICD-10-CM | POA: Diagnosis not present

## 2022-02-23 DIAGNOSIS — I10 Essential (primary) hypertension: Secondary | ICD-10-CM

## 2022-02-23 NOTE — Progress Notes (Unsigned)
Cardiology Office Note:    Date:  02/25/2022   ID:  [name], DOB 1951/07/16, MRN [id number]  PCP:  [name], MD   [facility] Providers Cardiologist:  Quay Burow, MD     Referring MD: [name], MD   Chief Complaint  Patient presents with   Follow-up    Seen for Dr. Gwenlyn Found    History of Present Illness:    [name] is a 71 y.o. male with a hx of ADHD, CAD s/p CABG x5 by Dr. Roxan Hockey on 06/16/2008 with LIMA-LAD, SVG-D1, SVG-OM1 and OM 2, SVG-PDA., carotid artery disease, hypertension, hyperlipidemia intolerant of statins, hypothyroidism, prostate cancer and a history of TIA.  He had cerebral angiogram in August 2009 due to question of high-grade internal carotid artery stenosis read by radiologist, however this turned out to be left external carotid artery stenosis.  She does have high-grade ostial left internal mammary artery stenosis.  She had squamous cell carcinoma at the base of his tongue in 2000 and underwent radiation therapy of head and neck.  Myoview in 2013 was normal.  Carotid Doppler showed moderate severe right ICA stenosis.  Repeat Myoview in June 2017 showed large reversible lateral perfusion defect consistent with ischemia.  Subsequent cardiac catheterization performed on 05/14/2016 showed severe native and graft disease, occluded RCA graft and OM graft, he had significant native left circumflex disease.  Dr. Roxan Hockey discussed with him possibilities regarding redo CABG, however it was eventually determined he is better served for attempted PCI and stenting of native left circumflex vessel.  He underwent DES of the left circumflex into OM branch on 06/14/2016.  He was seen by Dr. Alvester Chou in February 2022 at which time he was doing well.  He had a severe right carotid artery stenosis seen on Doppler in July 2022 and subsequently underwent stenting by vascular surgery.  Carotid Doppler in September 2022 showed a 1 to 39% left carotid artery stenosis,  patent stent in the right carotid artery.  Patient was recently seen by Ambrose Pancoast, NP on 01/15/2022 with complaint of dizziness and palpitation.  The symptom occurs both with walking and sitting.  He described palpitation as a empty feeling in his chest that occur with sudden force.  He discontinued his Adderall and stopped Cialis and Trintellix.  During the office visit, he has positive orthostatic vital sign with 20 point drop in systolic pressure from sitting to standing.  Blood pressure lying down was 141, standing 121.  Amlodipine was discontinued and he was started on 12.5 mg twice a day of metoprolol heart rate.  Patient has been instructed to drink more fluid in the morning.  Echocardiogram obtained on 01/22/2022 demonstrated EF 60 to 65%, mild LVH, normal RV with RV systolic pressure 48.1 mmHg, mild MR, mild AI.  Patient presents today accompanied by his wife.  His dizziness has completely resolved.  Initially after his amlodipine was taken off, his systolic blood pressure went up to 220s, he quickly restarted on the amlodipine.  Now his blood pressure is well controlled he does not have significant orthostatic dizziness.  I recommended continue on his current therapy.  His heart rate has been slow, however this is because instead of 12.5 mg twice a day of metoprolol, he is taking 25 mg of metoprolol daily.  I asked him to split the metoprolol in half and take 12.5 mg twice a day instead.  Otherwise he has been doing well and can follow-up with Dr. Gwenlyn Found  in 6 months.   Past Medical History:  Diagnosis Date   ADHD (attention deficit hyperactivity disorder)    Anxiety    Arthritis    "NECK, HANDS, KNEES" (06/14/2016)   Atypical mole 08/07/2021   Mid Back (Atypical Proliferation)   CAD (coronary artery disease)    Carotid artery stenosis    Complication of anesthesia    Reports that he had trouble swallowing post anesthesia- CABG   COPD (chronic obstructive pulmonary disease) (HCC) 12/11/2011    r/s mv - EF 72%; exercise capacity 13 METS; no exercised induced ischemic EKG changes   Depression, major    Dyspnea    with exertion   GERD (gastroesophageal reflux disease)    HTN (hypertension), benign 04/16/2008   echo - EF >55%; no regional wall or valvular abnormalities   Hyperlipidemia    statin intolerant   Hypothyroidism    Migraine    "crippling headaches as a kid; silent migraines now, no pain, couple times/week" (06/14/2016)   Peripheral vascular disease ([place])    Prostate cancer ([place]) 04/2019   recent dx - Dr Lawerance Bach   S/P angioplasty with stent 06/14/16 PCI & DES of high grade AV groove LCX and 1st OM  06/15/2016   Squamous cell cancer of tongue ([place]) ~ 2002   "[facility]"   Subclavian steal syndrome 06/17/2012   carotid doppler - R systolic brachial pressure 162mHg, L 1573mg; R subclavian artery - proxmial obsstruction w/ abnormal monophasic waveforms, R ECA known occlusive disease; L ECA narrowing w/ 70-99% diameter reductiona   TIA (transient ischemic attack) 2005   Viral hepatitis 1970s   "non specific"    Past Surgical History:  Procedure Laterality Date   BACK SURGERY     BIOPSY  02/20/2018   Procedure: BIOPSY;  Surgeon: MaJuanita CraverMD;  Location: [facility];  Service: Endoscopy;;   BIOPSY TONGUE  ~ 2002   "base of tongue and uvula"   CARDIAC CATHETERIZATION  04/26/2008   L circumflex 99% stenosed midportion straddling a marginal branch; RCA total priximally w/ grade 2 L to R collaterals; renal arteries widely patent; LAD 30% segmental proximal stenosis   CARDIAC CATHETERIZATION N/A 05/14/2016   Procedure: Left Heart Cath and Cors/Grafts Angiography;  Surgeon: JoLorretta HarpMD;  [facility];  Service: Cardiovascular;  Laterality: N/A;   CARDIAC CATHETERIZATION N/A 06/14/2016   Procedure: Coronary Stent Intervention;  Surgeon: JoLorretta HarpMD;  [facility];  Service: Cardiovascular;   Laterality: N/A;   CERVICAL DISC SURGERY  2000   [place]   COLONOSCOPY WITH PROPOFOL N/A 02/20/2018   Procedure: COLONOSCOPY WITH PROPOFOL;  Surgeon: MaJuanita CraverMD;  [facility];  Service: Endoscopy;  Laterality: N/A;   CORONARY ANGIOPLASTY WITH STENT PLACEMENT     CORONARY ARTERY BYPASS GRAFT  2009   x5; LIMA to LAd, sequential vein to first diagonal branch, first and second obtuse marginal branches; vein to PDA   ESOPHAGOGASTRODUODENOSCOPY (EGD) WITH PROPOFOL N/A 02/20/2018   Procedure: ESOPHAGOGASTRODUODENOSCOPY (EGD) WITH PROPOFOL;  Surgeon: MaJuanita CraverMD;  [facility];  Service: Endoscopy;  Laterality: N/A;   INSERTION OF MESH N/A 10/29/2017   Procedure: INSERTION OF MESH;  Surgeon: TsDonnie MesaMD;  [facility] Service: General;  Laterality: N/A;   LUMBAR DIScandiaURGERY  06/17/2019   LUMBAR 2 & 3    LUMBAR LAMINECTOMY/DECOMPRESSION MICRODISCECTOMY N/A 06/17/2019   Procedure: Lumbar two -Lumbar three decompression  and disectomy;  Surgeon: Melina Schools, MD;  Location: [place];  Service: Orthopedics;  Laterality: N/A;  3 hrs   POLYPECTOMY  02/20/2018   Procedure: POLYPECTOMY;  Surgeon: Juanita Craver, MD;  [facility];  Service: Endoscopy;;   SHOULDER ARTHROSCOPY WITH SUBACROMIAL DECOMPRESSION AND OPEN ROTATOR C Left 09/28/2013   Procedure: LEFT SHOULDER ARTHROSCOPY WITH SUBACROMIAL DECOMPRESSION AND MINI OPEN ROTATOR CUFF REPAIR, OPEN DISTAL CLAVICLE RESECTION AND OPEN BICEP TENDODESIS ;  Surgeon: Augustin Schooling, MD;  Location: [place];  Service: Orthopedics;  Laterality: Left;   TRANSCAROTID ARTERY REVASCULARIZATION  Right 05/05/2021   Procedure: RIGHT TRANSCAROTID ARTERY REVASCULARIZATION;  Surgeon: Cherre Robins, MD;  Location: [place];  Service: Vascular;  Laterality: Right;   UMBILICAL HERNIA REPAIR N/A 10/29/2017   Procedure: UMBILICAL HERNIA REPAIR;  Surgeon: Donnie Mesa, MD;  Location: [place];  Service: General;  Laterality: N/A;    Current  Medications: Current Meds  Medication Sig   albuterol (VENTOLIN HFA) 108 (90 Base) MCG/ACT inhaler Inhale 2 puffs into the lungs every 6 (six) hours as needed for wheezing or shortness of breath.   Alirocumab (PRALUENT) 150 MG/ML SOAJ INJECT 150 MG INTO THE SKIN EVERY 14 (FOURTEEN) DAYS.   amLODipine (NORVASC) 10 MG tablet TAKE 1 TABLET BY MOUTH DAILY   amphetamine-dextroamphetamine (ADDERALL) 20 MG tablet Take 1 tablet (20 mg total) by mouth 3 (three) times daily.   aspirin EC 81 MG tablet Take 81 mg by mouth daily. Swallow whole.   COVID-19 mRNA bivalent vaccine, Pfizer, (PFIZER COVID-19 VAC BIVALENT) injection Inject into the muscle.   hydrochlorothiazide (MICROZIDE) 12.5 MG capsule TAKE 1 CAPSULE BY MOUTH DAILY   influenza vaccine adjuvanted (FLUAD) 0.5 ML injection Inject into the muscle.   levothyroxine (SYNTHROID) 75 MCG tablet TAKE 1 TABLET BY MOUTH ONCE DAILY   LORazepam (ATIVAN) 1 MG tablet Take 1 tablet by mouth three times daily as needed   metoprolol tartrate (LOPRESSOR) 25 MG tablet Take 1/2 tablet (12.5 mg total) by mouth 2 (two) times daily. (Patient taking differently: Take 25 mg by mouth 2 (two) times daily. Take 1 tablet by mouth daily)   pantoprazole (PROTONIX) 40 MG tablet TAKE 1 TABLET (40 MG TOTAL) BY MOUTH DAILY.   sertraline (ZOLOFT) 100 MG tablet Take 1 tablet by mouth once daily for 7 days, then take 1 & 1/2 tablet for 7 days, then take 2 tablets daily   tadalafil (CIALIS) 5 MG tablet Take 1 tablet (5 mg total) by mouth daily. Take 3-5 times a week for penile rehabilitation. May take up to '20mg'$  as needed for intercourse.   Zoster Vaccine Adjuvanted [facility]) injection Inject into the muscle.   Current Facility-Administered Medications for the 02/23/22 encounter (Office Visit) with Almyra Deforest, PA  Medication   ticagrelor (BRILINTA) tablet 90 mg     Allergies:   Crestor [rosuvastatin calcium], Tramadol, Lisinopril, Gabapentin, Other, Tricor [fenofibrate], and Lipitor  [atorvastatin calcium]   Social History   Socioeconomic History   Marital status: Married    Spouse name: Not on file   Number of children: Not on file   Years of education: Not on file   Highest education level: Not on file  Occupational History   Not on file  Tobacco Use   Smoking status: Former    Packs/day: 3.00    Years: 30.00    Pack years: 90.00    Types: Cigarettes    Quit date: 01/29/2001    Years since quitting: 21.0  Smokeless tobacco: Never  Vaping Use   Vaping Use: Never used  Substance and Sexual Activity   Alcohol use: Yes    Alcohol/week: 21.0 standard drinks    Types: 21 Cans of beer per week    Comment: 2-3 day beers   Drug use: No   Sexual activity: Not on file  Other Topics Concern   Not on file  Social History Narrative   Not on file   Social Determinants of Health   Financial Resource Strain: Not on file  Food Insecurity: Not on file  Transportation Needs: Not on file  Physical Activity: Not on file  Stress: Not on file  Social Connections: Not on file     Family History: The patient's family history includes Arthritis in his father; Cancer in his mother; Diabetes in his father; Hypertension in his brother; Stroke in his father.  ROS:   Please see the history of present illness.     All other systems reviewed and are negative.  EKGs/Labs/Other Studies Reviewed:    The following studies were reviewed today:  Echo 01/22/2022  1. Left ventricular ejection fraction, by estimation, is 60 to 65%. Left  ventricular ejection fraction by 3D volume is 61 %. The left ventricle has  normal function. The left ventricle has no regional wall motion  abnormalities. There is mild concentric  left ventricular hypertrophy. Left ventricular diastolic parameters are  consistent with Grade II diastolic dysfunction (pseudonormalization).   2. Right ventricular systolic function is normal. The right ventricular  size is normal. There is normal pulmonary  artery systolic pressure. The  estimated right ventricular systolic pressure is 50.9 mmHg.   3. Left atrial size was moderately dilated.   4. The mitral valve is abnormal. Mild mitral valve regurgitation.  Moderate mitral annular calcification.   5. The aortic valve has an indeterminant number of cusps. There is mild  calcification of the aortic valve. There is mild thickening of the aortic  valve. Aortic valve regurgitation is mild.   6. Aortic dilatation noted. There is borderline dilatation of the aortic  root, measuring 36 mm.   7. The inferior vena cava is normal in size with greater than 50%  respiratory variability, suggesting right atrial pressure of 3 mmHg.   Comparison(s): Compared to prior TTE in 2017, there is no significant  change.   EKG:  EKG is not ordered today.    Recent Labs: 06/08/2021: ALT 20; BUN 17; Creat 1.04; Hemoglobin 13.2; Platelets 163; Potassium 3.8; Sodium 140; TSH 1.04  Recent Lipid Panel    Component Value Date/Time   CHOL 141 05/06/2021 0432   CHOL 164 07/03/2018 1241   TRIG 110 05/06/2021 0432   HDL 65 05/06/2021 0432   HDL 93 07/03/2018 1241   CHOLHDL 2.2 05/06/2021 0432   VLDL 22 05/06/2021 0432   LDLCALC 54 05/06/2021 0432   LDLCALC 52 03/30/2021 0950     Risk Assessment/Calculations:           Physical Exam:    VS:  BP (!) 116/58   Pulse (!) 46   Ht '5\' 10"'$  (1.778 m)   Wt 152 lb (68.9 kg)   SpO2 100%   BMI 21.81 kg/m     Wt Readings from Last 3 Encounters:  02/23/22 152 lb (68.9 kg)  01/15/22 149 lb 6.4 oz (67.8 kg)  06/08/21 150 lb (68 kg)     GEN:  Well nourished, well developed in no acute distress HEENT: Normal NECK: No JVD;  No carotid bruits LYMPHATICS: No lymphadenopathy CARDIAC: RRR, no murmurs, rubs, gallops RESPIRATORY:  Clear to auscultation without rales, wheezing or rhonchi  ABDOMEN: Soft, non-tender, non-distended MUSCULOSKELETAL:  No edema; No deformity  SKIN: Warm and dry NEUROLOGIC:  Alert and  oriented x 3 PSYCHIATRIC:  Normal affect   ASSESSMENT:    1. Orthostatic dizziness   2. Coronary artery disease involving coronary bypass graft of native heart without angina pectoris   3. Essential hypertension   4. Hyperlipidemia LDL goal <70   5. Bradycardia    PLAN:    In order of problems listed above:  Orthostatic dizziness: Previously, his amlodipine was discontinued and metoprolol tartrate 12.5 mg twice a day was added to his medical regimen.  After he discontinue amlodipine, his systolic blood pressure went up to 220, and that he felt poorly, therefore he restarted on the amlodipine.  Instead of 12.5 mg twice a day of metoprolol, he is taking the metoprolol at 25 mg daily.  Heart rate is low today, however his dizziness has completely resolved,.  I asked him to divide the Toprol tartrate to 12.5 mg twice a day instead of taking it 25 mg daily  CAD s/p CABG: Denies any recent chest pain  Hypertension: Blood pressure stable  Hyperlipidemia: On Praluent  Bradycardia: Heart rate in the high 40s, he is asymptomatic without any dizziness, blurred vision or feeling of passing out.  He is scheduled 12.5 mg twice a day of metoprolol, he is taking metoprolol at 25 mg daily, I asked him to divide it into half a tablet twice a day.        Medication Adjustments/Labs and Tests Ordered: Current medicines are reviewed at length with the patient today.  Concerns regarding medicines are outlined above.  No orders of the defined types were placed in this encounter.  No orders of the defined types were placed in this encounter.   Patient Instructions  Medication Instructions:  TAKE Metoprolol Tartrate (Lopressor) 12.5 mg daily  *If you need a refill on your cardiac medications before your next appointment, please call your pharmacy*  Lab Work: NONE ordered at this time of appointment   If you have labs (blood work) drawn today and your tests are completely normal, you will receive  your results only by: [place] (if you have MyChart) OR A paper copy in the mail If you have any lab test that is abnormal or we need to change your treatment, we will call you to review the results.  Testing/Procedures: NONE ordered at this time of appointment   Follow-Up: At [facility], you and your health needs are our priority.  As part of our continuing mission to provide you with exceptional heart care, we have created designated Provider Care Teams.  These Care Teams include your primary Cardiologist (physician) and Advanced Practice Providers (APPs -  Physician Assistants and Nurse Practitioners) who all work together to provide you with the care you need, when you need it.    Your next appointment:   6 month(s)  The format for your next appointment:   In Person  Provider:   Quay Burow, MD     Other Instructions   Important Information About Sugar         Hilbert Corrigan, [place]  02/25/2022 10:42 PM    [place]

## 2022-02-23 NOTE — Patient Instructions (Signed)
Medication Instructions:  TAKE Metoprolol Tartrate (Lopressor) 12.5 mg daily  *If you need a refill on your cardiac medications before your next appointment, please call your pharmacy*  Lab Work: NONE ordered at this time of appointment   If you have labs (blood work) drawn today and your tests are completely normal, you will receive your results only by: [place] (if you have MyChart) OR A paper copy in the mail If you have any lab test that is abnormal or we need to change your treatment, we will call you to review the results.  Testing/Procedures: NONE ordered at this time of appointment   Follow-Up: At [facility], you and your health needs are our priority.  As part of our continuing mission to provide you with exceptional heart care, we have created designated Provider Care Teams.  These Care Teams include your primary Cardiologist (physician) and Advanced Practice Providers (APPs -  Physician Assistants and Nurse Practitioners) who all work together to provide you with the care you need, when you need it.    Your next appointment:   6 month(s)  The format for your next appointment:   In Person  Provider:   Quay Burow, MD     Other Instructions   Important Information About Sugar

## 2022-02-25 ENCOUNTER — Encounter: Payer: Self-pay | Admitting: Physician Assistant

## 2022-02-28 ENCOUNTER — Other Ambulatory Visit (HOSPITAL_BASED_OUTPATIENT_CLINIC_OR_DEPARTMENT_OTHER): Payer: Self-pay

## 2022-03-06 ENCOUNTER — Other Ambulatory Visit: Payer: Self-pay | Admitting: *Deleted

## 2022-03-06 DIAGNOSIS — I6523 Occlusion and stenosis of bilateral carotid arteries: Secondary | ICD-10-CM

## 2022-03-08 ENCOUNTER — Other Ambulatory Visit (HOSPITAL_BASED_OUTPATIENT_CLINIC_OR_DEPARTMENT_OTHER): Payer: Self-pay

## 2022-03-08 DIAGNOSIS — R972 Elevated prostate specific antigen [PSA]: Secondary | ICD-10-CM | POA: Diagnosis not present

## 2022-03-08 DIAGNOSIS — C61 Malignant neoplasm of prostate: Secondary | ICD-10-CM | POA: Diagnosis not present

## 2022-03-12 NOTE — Progress Notes (Signed)
History of Present Illness:  Patient is a 71 y.o. year old male who presents for evaluation of carotid stenosis.  He has a history of asymptomatic ICA stenosis > 90% s/p right TCAR.   The patient denies symptoms of TIA, amaurosis, or stroke. He is medically managed on Effient and Praluent injection.  He does not tolerate Statins or Plavix.   Past Medical History:  Diagnosis Date   ADHD (attention deficit hyperactivity disorder)    Anxiety    Arthritis    "NECK, HANDS, KNEES" (06/14/2016)   Atypical mole 08/07/2021   Mid Back (Atypical Proliferation)   CAD (coronary artery disease)    Carotid artery stenosis    Complication of anesthesia    Reports that he had trouble swallowing post anesthesia- CABG   COPD (chronic obstructive pulmonary disease) (HCC) 12/11/2011   r/s mv - EF 72%; exercise capacity 13 METS; no exercised induced ischemic EKG changes   Depression, major    Dyspnea    with exertion   GERD (gastroesophageal reflux disease)    HTN (hypertension), benign 04/16/2008   echo - EF >55%; no regional wall or valvular abnormalities   Hyperlipidemia    statin intolerant   Hypothyroidism    Migraine    "crippling headaches as a kid; silent migraines now, no pain, couple times/week" (06/14/2016)   Peripheral vascular disease ([place])    Prostate cancer ([place]) 04/2019   recent dx - Dr Lawerance Bach   S/P angioplasty with stent 06/14/16 PCI & DES of high grade AV groove LCX and 1st OM  06/15/2016   Squamous cell cancer of tongue ([place]) ~ 2002   "35 radiation treatments at [facility]"   Subclavian steal syndrome 06/17/2012   carotid doppler - R systolic brachial pressure 163mHg, L 1541mg; R subclavian artery - proxmial obsstruction w/ abnormal monophasic waveforms, R ECA known occlusive disease; L ECA narrowing w/ 70-99% diameter reductiona   TIA (transient ischemic attack) 2005   Viral hepatitis 1970s   "non specific"    Past Surgical History:  Procedure Laterality Date    BACK SURGERY     BIOPSY  02/20/2018   Procedure: BIOPSY;  Surgeon: MaJuanita CraverMD;  [facility];  Service: Endoscopy;;   BIOPSY TONGUE  ~ 2002   "base of tongue and uvula"   CARDIAC CATHETERIZATION  04/26/2008   L circumflex 99% stenosed midportion straddling a marginal branch; RCA total priximally w/ grade 2 L to R collaterals; renal arteries widely patent; LAD 30% segmental proximal stenosis   CARDIAC CATHETERIZATION N/A 05/14/2016   Procedure: Left Heart Cath and Cors/Grafts Angiography;  Surgeon: JoLorretta HarpMD;  [facility];  Service: Cardiovascular;  Laterality: N/A;   CARDIAC CATHETERIZATION N/A 06/14/2016   Procedure: Coronary Stent Intervention;  Surgeon: JoLorretta HarpMD;  [facility];  Service: Cardiovascular;  Laterality: N/A;   CERVICAL DISC SURGERY  2000   [place]   COLONOSCOPY WITH PROPOFOL N/A 02/20/2018   Procedure: COLONOSCOPY WITH PROPOFOL;  Surgeon: MaJuanita CraverMD;  [facility];  Service: Endoscopy;  Laterality: N/A;   CORONARY ANGIOPLASTY WITH STENT PLACEMENT     CORONARY ARTERY BYPASS GRAFT  2009   x5; LIMA to LAd, sequential vein to first diagonal branch, first and second obtuse marginal branches; vein to PDA   ESOPHAGOGASTRODUODENOSCOPY (EGD) WITH PROPOFOL N/A 02/20/2018   Procedure: ESOPHAGOGASTRODUODENOSCOPY (EGD) WITH PROPOFOL;  Surgeon: MaJuanita CraverMD;  [facility];  Service:  Endoscopy;  Laterality: N/A;   INSERTION OF MESH N/A 10/29/2017   Procedure: INSERTION OF MESH;  Surgeon: Donnie Mesa, MD;  Location: [place];  Service: General;  Laterality: N/A;   LUMBAR [place] SURGERY  06/17/2019   LUMBAR 2 & 3    LUMBAR LAMINECTOMY/DECOMPRESSION MICRODISCECTOMY N/A 06/17/2019   Procedure: Lumbar two -Lumbar three decompression and disectomy;  Surgeon: Melina Schools, MD;  Location: [place];  Service: Orthopedics;  Laterality: N/A;  3 hrs   POLYPECTOMY  02/20/2018   Procedure: POLYPECTOMY;  Surgeon:  Juanita Craver, MD;  [facility];  Service: Endoscopy;;   SHOULDER ARTHROSCOPY WITH SUBACROMIAL DECOMPRESSION AND OPEN ROTATOR C Left 09/28/2013   Procedure: LEFT SHOULDER ARTHROSCOPY WITH SUBACROMIAL DECOMPRESSION AND MINI OPEN ROTATOR CUFF REPAIR, OPEN DISTAL CLAVICLE RESECTION AND OPEN BICEP TENDODESIS ;  Surgeon: Augustin Schooling, MD;  Location: [place];  Service: Orthopedics;  Laterality: Left;   TRANSCAROTID ARTERY REVASCULARIZATION  Right 05/05/2021   Procedure: RIGHT TRANSCAROTID ARTERY REVASCULARIZATION;  Surgeon: Cherre Robins, MD;  Location: [place];  Service: Vascular;  Laterality: Right;   UMBILICAL HERNIA REPAIR N/A 10/29/2017   Procedure: UMBILICAL HERNIA REPAIR;  Surgeon: Donnie Mesa, MD;  Location: [place];  Service: General;  Laterality: N/A;     Social History Social History   Tobacco Use   Smoking status: Former    Packs/day: 3.00    Years: 30.00    Total pack years: 90.00    Types: Cigarettes    Quit date: 01/29/2001    Years since quitting: 21.1   Smokeless tobacco: Never  Vaping Use   Vaping Use: Never used  Substance Use Topics   Alcohol use: Yes    Alcohol/week: 21.0 standard drinks of alcohol    Types: 21 Cans of beer per week    Comment: 2-3 day beers   Drug use: No    Family History Family History  Problem Relation Age of Onset   Cancer Mother        ovarian   Stroke Father    Diabetes Father    Arthritis Father    Hypertension Brother     Allergies  Allergies  Allergen Reactions   Crestor [Rosuvastatin Calcium] Other (See Comments)    Difficulty swallowing   Tramadol Shortness Of Breath   Lisinopril Cough   Gabapentin Other (See Comments)    Hallucinations   Other    Tricor [Fenofibrate] Other (See Comments)    UNSPECIFIED REACTION   Lipitor [Atorvastatin Calcium] Other (See Comments)    Muscle spasms     Current Outpatient Medications  Medication Sig Dispense Refill   albuterol (VENTOLIN HFA) 108 (90 Base) MCG/ACT  inhaler Inhale 2 puffs into the lungs every 6 (six) hours as needed for wheezing or shortness of breath. 18 g 5   Alirocumab (PRALUENT) 150 MG/ML SOAJ INJECT 150 MG INTO THE SKIN EVERY 14 (FOURTEEN) DAYS. 2 mL 11   amLODipine (NORVASC) 10 MG tablet TAKE 1 TABLET BY MOUTH DAILY 90 tablet 3   amphetamine-dextroamphetamine (ADDERALL) 20 MG tablet Take 1 tablet (20 mg total) by mouth 3 (three) times daily. 270 tablet 0   aspirin EC 81 MG tablet Take 81 mg by mouth daily. Swallow whole.     COVID-19 mRNA bivalent vaccine, Pfizer, (PFIZER COVID-19 VAC BIVALENT) injection Inject into the muscle. 0.3 mL 0   hydrochlorothiazide (MICROZIDE) 12.5 MG capsule TAKE 1 CAPSULE BY MOUTH DAILY 90 capsule 3   influenza vaccine adjuvanted (FLUAD) 0.5 ML injection  Inject into the muscle. 0.5 mL 0   levothyroxine (SYNTHROID) 75 MCG tablet TAKE 1 TABLET BY MOUTH ONCE DAILY 90 tablet 1   LORazepam (ATIVAN) 1 MG tablet Take 1 tablet by mouth three times daily as needed 270 tablet 1   metoprolol tartrate (LOPRESSOR) 25 MG tablet Take 1/2 tablet (12.5 mg total) by mouth 2 (two) times daily. (Patient taking differently: Take 25 mg by mouth 2 (two) times daily. Take 1/2  tablet by mouth twice daily) 90 tablet 3   pantoprazole (PROTONIX) 40 MG tablet TAKE 1 TABLET (40 MG TOTAL) BY MOUTH DAILY. 90 tablet 3   sertraline (ZOLOFT) 100 MG tablet Take 1 tablet by mouth once daily for 7 days, then take 1 & 1/2 tablet for 7 days, then take 2 tablets daily 60 tablet 3   tadalafil (CIALIS) 5 MG tablet Take 1 tablet (5 mg total) by mouth daily. Take 3-5 times a week for penile rehabilitation. May take up to '20mg'$  as needed for intercourse. 30 tablet 2   Zoster Vaccine Adjuvanted [facility]) injection Inject into the muscle. 1 each 1   Current Facility-Administered Medications  Medication Dose Route Frequency Provider Last Rate Last Admin   ticagrelor (BRILINTA) tablet 90 mg  90 mg Oral BID Cherre Robins, MD        ROS:   General:   No weight loss, Fever, chills  HEENT: No recent headaches, no nasal bleeding, no visual changes, no sore throat  Neurologic: No dizziness, blackouts, seizures. No recent symptoms of stroke or mini- stroke. No recent episodes of slurred speech, or temporary blindness.  Cardiac: No recent episodes of chest pain/pressure, no shortness of breath at rest.  No shortness of breath with exertion.  Denies history of atrial fibrillation or irregular heartbeat  Vascular: No history of rest pain in feet.  No history of claudication.  No history of non-healing ulcer, No history of DVT   Pulmonary: No home oxygen, no productive cough, no hemoptysis,  No asthma or wheezing  Musculoskeletal:  '[ ]'$  Arthritis, '[ ]'$  Low back pain,  '[ ]'$  Joint pain  Hematologic:No history of hypercoagulable state.  No history of easy bleeding.  No history of anemia  Gastrointestinal: No hematochezia or melena,  No gastroesophageal reflux, no trouble swallowing  Urinary: '[ ]'$  chronic Kidney disease, '[ ]'$  on HD - '[ ]'$  MWF or '[ ]'$  TTHS, '[ ]'$  Burning with urination, '[ ]'$  Frequent urination, '[ ]'$  Difficulty urinating;   Skin: No rashes  Psychological: No history of anxiety,  No history of depression   Physical Examination  Vitals:   03/13/22 1241 03/13/22 1245  BP: (!) 118/58 108/68  Pulse: (!) 47   Resp: 18   Temp: (!) 97.3 F (36.3 C)   TempSrc: Temporal   SpO2: 97%   Weight: 148 lb 4.8 oz (67.3 kg)   Height: 5' 6.5" (1.689 m)     Body mass index is 23.58 kg/m.  General:  Alert and oriented, no acute distress HEENT: Normal Neck: No bruit or JVD, reduced motion activitly Pulmonary: Clear to auscultation bilaterally Cardiac: Regular Rate and Rhythm without murmur Gastrointestinal: Soft, non-tender, non-distended, no mass, no scars Skin: No rash Extremity Pulses:  2+ radial Musculoskeletal: No deformity or edema  Neurologic: Upper and lower extremity motor 5/5 and symmetric  DATA:  as noted.      Right Carotid  Findings:  +----------+--------+--------+--------+------------------+-----------------  ----+            PSV cm/sEDV  cm/sStenosisPlaque DescriptionComments                +----------+--------+--------+--------+------------------+-----------------  ----+  CCA Prox  108     24                                                        +----------+--------+--------+--------+------------------+-----------------  ----+  CCA Mid   57      19      <50%    heterogenous                              +----------+--------+--------+--------+------------------+-----------------  ----+  CCA Distal60      19      <50%    heterogenous                              +----------+--------+--------+--------+------------------+-----------------  ----+  ICA Prox                                            stent                    +----------+--------+--------+--------+------------------+-----------------  ----+  ICA Mid   173     31                                stent                    +----------+--------+--------+--------+------------------+-----------------  ----+  ICA [id number]     35                                                        +----------+--------+--------+--------+------------------+-----------------  ----+  ECA       124     26                                small channel of  flow  +----------+--------+--------+--------+------------------+-----------------  ----+   +----------+--------+-------+--------+-------------------+            PSV cm/sEDV cmsDescribeArm Pressure (mmHG)  +----------+--------+-------+--------+-------------------+  [id number]            Stenotic90                   +----------+--------+-------+--------+-------------------+   +---------+--------+--+--------+--+--------+  VertebralPSV cm/s26EDV cm/s12Atypical  +---------+--------+--+--------+--+--------+       Right Stent(s):ICA   +---------------+---+--+---------------++--------+  Prox to Stent  60 19                         +---------------+---+--+---------------++--------+  Proximal Stent 39 17                         +---------------+---+--+---------------++--------+  Mid Stent      1543450-75% stenosis          +---------------+---+--+---------------++--------+  Distal Stent   13727                         +---------------+---+--+---------------++--------+  Distal to 210 695 2188               tortuous  +---------------+---+--+---------------++--------+      Left Carotid Findings:  +----------+--------+--------+--------+------------------+------------+            PSV cm/sEDV cm/sStenosisPlaque DescriptionComments      +----------+--------+--------+--------+------------------+------------+  CCA Prox  109     30                                              +----------+--------+--------+--------+------------------+------------+  CCA Mid   69      14      <50%    heterogenous                    +----------+--------+--------+--------+------------------+------------+  CCA Distal131     23      <50%    heterogenous                    +----------+--------+--------+--------+------------------+------------+  ICA Prox  94      21      1-39%   heterogenous                    +----------+--------+--------+--------+------------------+------------+  ICA Mid   165     43      40-59%                    tortuous      +----------+--------+--------+--------+------------------+------------+  ICA Distal111     31                                              +----------+--------+--------+--------+------------------+------------+  ECA       207     29                                trickle flow  +----------+--------+--------+--------+------------------+------------+   +----------+--------+--------+--------+-------------------+             PSV cm/sEDV cm/sDescribeArm Pressure (mmHG)  +----------+--------+--------+--------+-------------------+  Subclavian228     54      biphasic140                  +----------+--------+--------+--------+-------------------+   +---------+--------+--+--------+--+---------+  VertebralPSV cm/s94EDV cm/s29Antegrade  +---------+--------+--+--------+--+---------+           Summary:  Right Carotid: Non-hemodynamically significant plaque <50% noted in the  CCA.                 50-75% ICA stenosis based on stent velocity criteria.   Left Carotid: Velocities in the left ICA are consistent with a 40-59%  stenosis.                Category of disease may be overestimated due to vessel  tortuosity.                Non-hemodynamically significant plaque <50% noted in the  CCA.   Vertebrals:  Left vertebral artery demonstrates antegrade flow. Right  tardus  parvus.  Subclavians: Normal flow hemodynamics were seen in the left subclavian  artery.   ASSESSMENT/PLAN: Asymptomatic carotid stenosis s/p right TCAR by Dr. Stanford Breed He remains asymptomatic for stroke /TIA symptoms. He does have decreased active range of motion in his cervical spine and is followed by Dr. Nanda Quinton.    No significant changes on carotid duplex.  Stent is patent with 60% stenosis mid stent.  The left ICA is 40-59% stenosis.    He will return in 9 months for repeat carotid duplex.  If he develops problems or concerns he will call sooner.   Cont daily ASA he has an allergy to Statins.     Roxy Horseman PA-C  [facility] [phone]  MD in clinic [facility]

## 2022-03-13 ENCOUNTER — Ambulatory Visit (HOSPITAL_COMMUNITY)
Admission: RE | Admit: 2022-03-13 | Discharge: 2022-03-13 | Disposition: A | Discharge status: Home / Self Care | Payer: 59 | Source: Ambulatory Visit | Attending: Vascular Surgery | Admitting: Vascular Surgery

## 2022-03-13 ENCOUNTER — Ambulatory Visit (INDEPENDENT_AMBULATORY_CARE_PROVIDER_SITE_OTHER): Payer: 59 | Admitting: Physician Assistant

## 2022-03-13 VITALS — BP 108/68 | HR 47 | Temp 97.3°F | Resp 18 | Ht 66.5 in | Wt 148.3 lb

## 2022-03-13 DIAGNOSIS — I6523 Occlusion and stenosis of bilateral carotid arteries: Secondary | ICD-10-CM | POA: Insufficient documentation

## 2022-03-15 ENCOUNTER — Other Ambulatory Visit: Payer: Self-pay | Admitting: *Deleted

## 2022-03-15 DIAGNOSIS — I6523 Occlusion and stenosis of bilateral carotid arteries: Secondary | ICD-10-CM

## 2022-03-16 ENCOUNTER — Other Ambulatory Visit (HOSPITAL_BASED_OUTPATIENT_CLINIC_OR_DEPARTMENT_OTHER): Payer: Self-pay

## 2022-03-16 MED ORDER — TADALAFIL 5 MG PO TABS
ORAL_TABLET | ORAL | 2 refills | Status: DC
  Filled 2022-03-16: qty 30, 8d supply, fill #0
  Filled 2022-03-26: qty 30, 8d supply, fill #1
  Filled 2022-04-18: qty 30, 8d supply, fill #2

## 2022-03-16 MED ORDER — SOLIFENACIN SUCCINATE 5 MG PO TABS
5.0000 mg | ORAL_TABLET | Freq: Every day | ORAL | 5 refills | Status: DC
  Filled 2022-03-16: qty 30, 30d supply, fill #0
  Filled 2022-04-18: qty 30, 30d supply, fill #1
  Filled 2022-05-14: qty 30, 30d supply, fill #2
  Filled 2022-07-02 (×2): qty 30, 30d supply, fill #3
  Filled 2022-08-22: qty 30, 30d supply, fill #4
  Filled 2022-09-26: qty 30, 30d supply, fill #5

## 2022-03-27 ENCOUNTER — Other Ambulatory Visit (HOSPITAL_BASED_OUTPATIENT_CLINIC_OR_DEPARTMENT_OTHER): Payer: Self-pay

## 2022-03-28 DIAGNOSIS — M25511 Pain in right shoulder: Secondary | ICD-10-CM | POA: Diagnosis not present

## 2022-03-30 ENCOUNTER — Other Ambulatory Visit (HOSPITAL_BASED_OUTPATIENT_CLINIC_OR_DEPARTMENT_OTHER): Payer: Self-pay

## 2022-04-05 ENCOUNTER — Other Ambulatory Visit (HOSPITAL_BASED_OUTPATIENT_CLINIC_OR_DEPARTMENT_OTHER): Payer: Self-pay

## 2022-04-09 ENCOUNTER — Other Ambulatory Visit (HOSPITAL_BASED_OUTPATIENT_CLINIC_OR_DEPARTMENT_OTHER): Payer: Self-pay

## 2022-04-18 ENCOUNTER — Other Ambulatory Visit (HOSPITAL_BASED_OUTPATIENT_CLINIC_OR_DEPARTMENT_OTHER): Payer: Self-pay

## 2022-04-19 ENCOUNTER — Other Ambulatory Visit (HOSPITAL_BASED_OUTPATIENT_CLINIC_OR_DEPARTMENT_OTHER): Payer: Self-pay

## 2022-04-19 MED ORDER — AMPHETAMINE-DEXTROAMPHETAMINE 20 MG PO TABS
ORAL_TABLET | ORAL | 0 refills | Status: DC
Start: 2022-04-23 — End: 2023-02-13
  Filled 2022-04-23: qty 270, 90d supply, fill #0

## 2022-04-19 MED ORDER — SERTRALINE HCL 100 MG PO TABS
ORAL_TABLET | ORAL | 0 refills | Status: DC
  Filled 2022-04-19: qty 60, 35d supply, fill #0
  Filled 2022-06-05: qty 60, 30d supply, fill #0

## 2022-04-20 ENCOUNTER — Other Ambulatory Visit (HOSPITAL_BASED_OUTPATIENT_CLINIC_OR_DEPARTMENT_OTHER): Payer: Self-pay

## 2022-04-23 ENCOUNTER — Other Ambulatory Visit (HOSPITAL_BASED_OUTPATIENT_CLINIC_OR_DEPARTMENT_OTHER): Payer: Self-pay

## 2022-04-24 ENCOUNTER — Other Ambulatory Visit (HOSPITAL_BASED_OUTPATIENT_CLINIC_OR_DEPARTMENT_OTHER): Payer: Self-pay

## 2022-04-27 ENCOUNTER — Other Ambulatory Visit: Payer: 59

## 2022-04-27 DIAGNOSIS — C61 Malignant neoplasm of prostate: Secondary | ICD-10-CM | POA: Diagnosis not present

## 2022-04-27 DIAGNOSIS — I6523 Occlusion and stenosis of bilateral carotid arteries: Secondary | ICD-10-CM | POA: Diagnosis not present

## 2022-04-27 DIAGNOSIS — I10 Essential (primary) hypertension: Secondary | ICD-10-CM

## 2022-04-27 DIAGNOSIS — Z125 Encounter for screening for malignant neoplasm of prostate: Secondary | ICD-10-CM

## 2022-04-27 DIAGNOSIS — E039 Hypothyroidism, unspecified: Secondary | ICD-10-CM

## 2022-04-27 DIAGNOSIS — C01 Malignant neoplasm of base of tongue: Secondary | ICD-10-CM | POA: Diagnosis not present

## 2022-04-28 LAB — COMPLETE METABOLIC PANEL WITH GFR
AG Ratio: 1.9 (calc) (ref 1.0–2.5)
ALT: 18 U/L (ref 9–46)
AST: 19 U/L (ref 10–35)
Albumin: 4.3 g/dL (ref 3.6–5.1)
Alkaline phosphatase (APISO): 65 U/L (ref 35–144)
BUN: 14 mg/dL (ref 7–25)
CO2: 30 mmol/L (ref 20–32)
Calcium: 9.2 mg/dL (ref 8.6–10.3)
Chloride: 104 mmol/L (ref 98–110)
Creat: 1.21 mg/dL (ref 0.70–1.28)
Globulin: 2.3 g/dL (calc) (ref 1.9–3.7)
Glucose, Bld: 102 mg/dL — ABNORMAL HIGH (ref 65–99)
Potassium: 3.9 mmol/L (ref 3.5–5.3)
Sodium: 144 mmol/L (ref 135–146)
Total Bilirubin: 0.4 mg/dL (ref 0.2–1.2)
Total Protein: 6.6 g/dL (ref 6.1–8.1)
eGFR: 64 mL/min/{1.73_m2} (ref >=60)

## 2022-04-28 LAB — CBC WITH DIFFERENTIAL/PLATELET
Absolute Monocytes: 331 cells/uL (ref 200–950)
Basophils Absolute: 40 cells/uL (ref 0–200)
Basophils Relative: 1.1 %
Eosinophils Absolute: 191 cells/uL (ref 15–500)
Eosinophils Relative: 5.3 %
HCT: 46.7 % (ref 38.5–50.0)
Hemoglobin: 16 g/dL (ref 13.2–17.1)
Lymphs Abs: 292 cells/uL — ABNORMAL LOW (ref 850–3900)
MCH: 31.1 pg (ref 27.0–33.0)
MCHC: 34.3 g/dL (ref 32.0–36.0)
MCV: 90.9 fL (ref 80.0–100.0)
MPV: 9.1 fL (ref 7.5–12.5)
Monocytes Relative: 9.2 %
Neutro Abs: 2747 cells/uL (ref 1500–7800)
Neutrophils Relative %: 76.3 %
Platelets: 146 10*3/uL (ref 140–400)
RBC: 5.14 10*6/uL (ref 4.20–5.80)
RDW: 14.3 % (ref 11.0–15.0)
Total Lymphocyte: 8.1 %
WBC: 3.6 10*3/uL — ABNORMAL LOW (ref 3.8–10.8)

## 2022-04-28 LAB — LIPID PANEL
Cholesterol: 149 mg/dL (ref <200)
HDL: 82 mg/dL (ref >=40)
LDL Cholesterol (Calc): 47 mg/dL (calc)
Non-HDL Cholesterol (Calc): 67 mg/dL (calc) (ref <130)
Total CHOL/HDL Ratio: 1.8 (calc) (ref <5.0)
Triglycerides: 115 mg/dL (ref <150)

## 2022-04-28 LAB — PSA: PSA: <0.04 ng/mL (ref <=4.00)

## 2022-04-28 LAB — TSH: TSH: 4.44 mIU/L (ref 0.40–4.50)

## 2022-04-30 ENCOUNTER — Encounter: Payer: Self-pay | Admitting: Internal Medicine

## 2022-04-30 ENCOUNTER — Ambulatory Visit (INDEPENDENT_AMBULATORY_CARE_PROVIDER_SITE_OTHER): Payer: 59 | Admitting: Internal Medicine

## 2022-04-30 VITALS — BP 136/68 | HR 63 | Temp 98.7°F | Ht 66.0 in | Wt 147.8 lb

## 2022-04-30 DIAGNOSIS — E039 Hypothyroidism, unspecified: Secondary | ICD-10-CM | POA: Diagnosis not present

## 2022-04-30 DIAGNOSIS — F419 Anxiety disorder, unspecified: Secondary | ICD-10-CM

## 2022-04-30 DIAGNOSIS — C61 Malignant neoplasm of prostate: Secondary | ICD-10-CM

## 2022-04-30 DIAGNOSIS — F32A Depression, unspecified: Secondary | ICD-10-CM

## 2022-04-30 DIAGNOSIS — Z0000 Encounter for general adult medical examination without abnormal findings: Secondary | ICD-10-CM | POA: Diagnosis not present

## 2022-04-30 DIAGNOSIS — R1319 Other dysphagia: Secondary | ICD-10-CM | POA: Diagnosis not present

## 2022-04-30 DIAGNOSIS — Z8709 Personal history of other diseases of the respiratory system: Secondary | ICD-10-CM

## 2022-04-30 DIAGNOSIS — E7849 Other hyperlipidemia: Secondary | ICD-10-CM | POA: Diagnosis not present

## 2022-04-30 DIAGNOSIS — Z8673 Personal history of transient ischemic attack (TIA), and cerebral infarction without residual deficits: Secondary | ICD-10-CM

## 2022-04-30 DIAGNOSIS — J449 Chronic obstructive pulmonary disease, unspecified: Secondary | ICD-10-CM

## 2022-04-30 DIAGNOSIS — Z789 Other specified health status: Secondary | ICD-10-CM

## 2022-04-30 DIAGNOSIS — I6521 Occlusion and stenosis of right carotid artery: Secondary | ICD-10-CM

## 2022-04-30 DIAGNOSIS — R5383 Other fatigue: Secondary | ICD-10-CM

## 2022-04-30 DIAGNOSIS — Z951 Presence of aortocoronary bypass graft: Secondary | ICD-10-CM

## 2022-04-30 DIAGNOSIS — D0007 Carcinoma in situ of tongue: Secondary | ICD-10-CM

## 2022-04-30 DIAGNOSIS — Z7901 Long term (current) use of anticoagulants: Secondary | ICD-10-CM

## 2022-04-30 DIAGNOSIS — K2100 Gastro-esophageal reflux disease with esophagitis, without bleeding: Secondary | ICD-10-CM

## 2022-04-30 DIAGNOSIS — I10 Essential (primary) hypertension: Secondary | ICD-10-CM | POA: Diagnosis not present

## 2022-04-30 DIAGNOSIS — I771 Stricture of artery: Secondary | ICD-10-CM

## 2022-04-30 LAB — POCT URINALYSIS DIPSTICK
Bilirubin, UA: NEGATIVE
Blood, UA: NEGATIVE
Glucose, UA: NEGATIVE
Ketones, UA: NEGATIVE
Leukocytes, UA: NEGATIVE
Nitrite, UA: NEGATIVE
Protein, UA: NEGATIVE
Spec Grav, UA: 1.02 (ref 1.010–1.025)
Urobilinogen, UA: 0.2 E.U./dL
pH, UA: 6 (ref 5.0–8.0)

## 2022-04-30 NOTE — Progress Notes (Signed)
Subjective:    Patient ID: [name], male    DOB: 1951/02/02, 71 y.o.   MRN: [id number]  HPI 72 year Male seen for health maintenance exam and evaluation of medical issues.  He is followed at [facility] for history of prostate cancer.  He had radical assisted robotic prostatectomy November 2021.  He completed radiation treatments.  His PSA was in the 3-4 range but increased to 6.6 in March 2020.  Subsequently PSA increased to 13.25 in October 2021 and a biopsy done confirmed adenocarcinoma of the prostate with Gleason score of 7.  History of right carotid endarterectomy.  Dr. Donnella Bi is his cardiologist.  He has a history of coronary artery disease.  History of hyperlipidemia but is statin intolerant.  History of low pressure differential in each arm due to subclavian stenosis.  History of hypertension.  History of issues with swallowing in May 2019 and underwent colonoscopy and upper endoscopy.  He had 3 small sessile polyps on colonoscopy.  He was having some dysphagia and had history of GE reflux.  He had normal stomach and normal duodenum on endoscopy.  There were esophageal mucosal changes but no evidence of cancer or eosinophilic esophagitis.  Biopsies were consistent with esophagitis and polyps were hyperplastic.  He has seen Dr. Beckie Salts, pulmonologist, in December 2020 for shortness of breath and was treated with an inhaler  He underwent L2-L3 discectomy September 2020 by Dr. Rolena Infante for severe intractable low back pain.  History of white blood cell count in the 3700 range which has been followed for several years and is asymptomatic.  History of neck pain previously seen by Dr. Vertell Limber.  Had numbness of left face but not neck with intermittent paresthesias down left arm.  He had MRI in late 2021 of C-spine showing significant facet arthropathy on the left at C2-C3 and C3-C4.  There was a fluid cyst in the left C3-C4 facet joint and mild degenerative  changes on the right at C6-C7.  Dr. Vertell Limber believed this to be the basis for his left-sided occipital neck and jaw pain.  He recommended facet blocks at C2-C3 and C3-C4 levels on the left.  History of depression, hypothyroidism, CABG x5 in 2009.  Remote history of smoking, history of left Horner syndrome, history of stroke, cervical disc disease, squamous cell carcinoma of the tongue, external carotid artery stenosis on the left, history of migraine headaches, allergic rhinitis, attention deficit disorder and history of back pain.  In December 2017 he had PCI and drug-eluting stent placement of a high-grade circumflex and first obtuse marginal branch stenosis in the setting of an occluded sequential vein graft with patent continuation of the OM1-OM 2  History of 70% right subclavian artery stenosis.  Percutaneous intervention was considered but has not been done.  He has a 50 mm upper extremity blood pressure differential due to right subclavian and/or innominate stenosis.  He is on chronic anticoagulation with Plavix.  He takes Zoloft for depression and lorazepam for anxiety per Dr. Toy Care.  History of left shoulder arthroscopy with subacromial decompression and open rotator cuff repair by Dr. Alma Friendly in 2014.  Open distal clavicle resection and open biceps tendon diathesis.  Intolerant of statins.  Dr. Alvester Chou has treated him with Praluent injections every 14 days.  History of GE reflux treated with Protonix.  In January 2018 he was seen in the emergency department with palpitations, diaphoresis and lightheadedness.  D-dimer was elevated at 0.75.  CT angio of  the chest with contrast showed no evidence of PE.  He was thought not to have had an MI and was discharged home.  CT angios of chest and aorta with and without contrast showed 70% proximal right subclavian artery stenosis.  History of mild plaque in left subclavian artery.  History of mild plaque in distal right subclavian artery.  Chronic  occlusion of the distal right vertebral artery.  He had discectomy of the C-spine around 2000.  He developed left eye ptosis after cardiac surgery.  Says his face feels numb when he turns his head to the left.  Had left thalamic stroke in 2005.  History of numbness of the left cheek in the C3 distribution.  Documented facet disease on the left at C2-C3, C4-C5, C5-C6 as well as C6-C7 which is aggravated by turning his head.  History of GE reflux.  Says he has remote history of viral hepatitis a number of years ago.  Patient says his neurologist, Dr. Erling Cruz, who is now retired felt his carotid disease was due to radiation therapy.  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  Social history: His wife works for [employer] in the [place].  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  He smoked for some 30 years but quit over 20 years ago.  1 son from previous marriage.  This is his second marriage.  He does drink beer sometimes.  Family history: Mother died of ovarian cancer.  Father died apparently of heart failure but also had history of stroke around age 17 and diabetes.  Maternal grandmother, paternal grandmother with history of cancer.  He has 2 brothers.  Regarding his depression, he has previously been on Wellbutrin and Pristiq in the past per Dr. Toy Care      Review of Systems will be seeing ENT regarding  hx of oral cancer and dysphagia.     Objective:   Physical Exam Vital signs reviewed.  Blood pressure 136/68, pulse 63, temperature 98.7 degrees pulse oximetry 96% weight 147 pounds 12 ounces height 5 feet 6 inches BMI 23.85. Skin: Warm and dry.  No cervical adenopathy or thyromegaly.  No carotid bruits heard today.  Chest clear.  Cardiac exam: Regular rate and rhythm.  Abdomen is soft nondistended without hepatosplenomegaly masses or  tenderness.  Rectal exam deferred.  No lower extremity pitting edema.  Brief neurological exam is intact.  Affect thought and judgment appear to be normal.      Assessment & Plan:  Hyperlipidemia treated with Praluent  History of PCI by Dr. Gwenlyn Found  History of coronary artery disease status post CABG  Essential hypertension-stable  Longstanding history of carotid disease  History of carcinoma base of tongue  History of COVID-19 2022  History of right transcarotid artery revascularization by Dr. Nevada Crane can in August 2022  Anxiety depression treated by Dr. Toy Care, psychiatrist  GE reflux treated with PPI  Chronic anticoagulation with Brilinta  History of left total thalamic stroke 2005  History of left Horner syndrome  History of right vertebral artery occlusion  History of prostate cancer being followed at [facility].  Gleason score at the time of diagnosis was 7.  Urge incontinence treated with Vesicare  Erectile dysfunction treated with Cialis  History of left shoulder arthropathy status post surgery  Hypothyroidism treated with thyroid replacement medication  History  of COPD seen by Pulmonology.  He quit smoking in 1995.  Plan: He is generally seen here annually and will continue current medications and follow-up here in a year.  Plan: Patient will continue close follow-up with his specialist.  Return here in 1 year or as needed.

## 2022-05-04 ENCOUNTER — Other Ambulatory Visit (HOSPITAL_BASED_OUTPATIENT_CLINIC_OR_DEPARTMENT_OTHER): Payer: Self-pay

## 2022-05-14 ENCOUNTER — Other Ambulatory Visit: Payer: Self-pay | Admitting: Internal Medicine

## 2022-05-14 ENCOUNTER — Other Ambulatory Visit: Payer: Self-pay | Admitting: Cardiovascular Disease

## 2022-05-14 ENCOUNTER — Other Ambulatory Visit (HOSPITAL_BASED_OUTPATIENT_CLINIC_OR_DEPARTMENT_OTHER): Payer: Self-pay

## 2022-05-14 DIAGNOSIS — I10 Essential (primary) hypertension: Secondary | ICD-10-CM

## 2022-05-14 MED ORDER — PANTOPRAZOLE SODIUM 40 MG PO TBEC
DELAYED_RELEASE_TABLET | Freq: Every day | ORAL | 3 refills | Status: DC
  Filled 2022-05-14: qty 90, 90d supply, fill #0
  Filled 2022-09-07: qty 90, 90d supply, fill #1
  Filled 2022-12-26: qty 90, 90d supply, fill #2
  Filled 2023-04-08: qty 90, 90d supply, fill #3

## 2022-05-14 MED ORDER — HYDROCHLOROTHIAZIDE 12.5 MG PO CAPS
12.5000 mg | ORAL_CAPSULE | Freq: Every day | ORAL | 1 refills | Status: DC
  Filled 2022-05-14: qty 90, 90d supply, fill #0
  Filled 2022-09-07: qty 90, 90d supply, fill #1

## 2022-05-15 ENCOUNTER — Other Ambulatory Visit (HOSPITAL_BASED_OUTPATIENT_CLINIC_OR_DEPARTMENT_OTHER): Payer: Self-pay

## 2022-05-15 MED ORDER — TADALAFIL 5 MG PO TABS
ORAL_TABLET | ORAL | 2 refills | Status: DC
  Filled 2022-05-15: qty 30, 8d supply, fill #0
  Filled 2022-06-05: qty 30, 8d supply, fill #1
  Filled 2022-07-02: qty 30, 8d supply, fill #2

## 2022-05-22 DIAGNOSIS — R1312 Dysphagia, oropharyngeal phase: Secondary | ICD-10-CM | POA: Diagnosis not present

## 2022-05-30 DIAGNOSIS — C139 Malignant neoplasm of hypopharynx, unspecified: Secondary | ICD-10-CM | POA: Diagnosis not present

## 2022-05-30 DIAGNOSIS — J3801 Paralysis of vocal cords and larynx, unilateral: Secondary | ICD-10-CM | POA: Diagnosis not present

## 2022-05-30 DIAGNOSIS — Z8589 Personal history of malignant neoplasm of other organs and systems: Secondary | ICD-10-CM | POA: Diagnosis not present

## 2022-05-30 DIAGNOSIS — R221 Localized swelling, mass and lump, neck: Secondary | ICD-10-CM | POA: Diagnosis not present

## 2022-05-30 DIAGNOSIS — R1310 Dysphagia, unspecified: Secondary | ICD-10-CM | POA: Diagnosis not present

## 2022-05-30 DIAGNOSIS — C138 Malignant neoplasm of overlapping sites of hypopharynx: Secondary | ICD-10-CM | POA: Diagnosis not present

## 2022-05-30 DIAGNOSIS — J387 Other diseases of larynx: Secondary | ICD-10-CM | POA: Diagnosis not present

## 2022-05-31 ENCOUNTER — Other Ambulatory Visit: Payer: 59

## 2022-05-31 ENCOUNTER — Encounter: Payer: Self-pay | Admitting: Internal Medicine

## 2022-05-31 DIAGNOSIS — E039 Hypothyroidism, unspecified: Secondary | ICD-10-CM

## 2022-05-31 NOTE — Patient Instructions (Signed)
It was a pleasure to see you today.  Healthcare maintenance exam performed.  Continue current medications and continue follow-up with your physicians.  Follow-up here in 1 year or as needed.

## 2022-06-01 LAB — TSH: TSH: 1.16 mIU/L (ref 0.40–4.50)

## 2022-06-05 ENCOUNTER — Other Ambulatory Visit: Payer: Self-pay | Admitting: Internal Medicine

## 2022-06-05 ENCOUNTER — Other Ambulatory Visit (HOSPITAL_BASED_OUTPATIENT_CLINIC_OR_DEPARTMENT_OTHER): Payer: Self-pay

## 2022-06-05 MED ORDER — LEVOTHYROXINE SODIUM 75 MCG PO TABS
ORAL_TABLET | Freq: Every day | ORAL | 1 refills | Status: DC
Start: 2022-06-05 — End: 2022-11-01
  Filled 2022-06-05: qty 90, 90d supply, fill #0
  Filled 2022-08-22: qty 90, 90d supply, fill #1

## 2022-06-07 ENCOUNTER — Other Ambulatory Visit (HOSPITAL_BASED_OUTPATIENT_CLINIC_OR_DEPARTMENT_OTHER): Payer: Self-pay

## 2022-06-07 DIAGNOSIS — R911 Solitary pulmonary nodule: Secondary | ICD-10-CM | POA: Diagnosis not present

## 2022-06-07 DIAGNOSIS — R9389 Abnormal findings on diagnostic imaging of other specified body structures: Secondary | ICD-10-CM | POA: Diagnosis not present

## 2022-06-07 DIAGNOSIS — K118 Other diseases of salivary glands: Secondary | ICD-10-CM | POA: Diagnosis not present

## 2022-06-07 DIAGNOSIS — J392 Other diseases of pharynx: Secondary | ICD-10-CM | POA: Diagnosis not present

## 2022-06-07 DIAGNOSIS — C132 Malignant neoplasm of posterior wall of hypopharynx: Secondary | ICD-10-CM | POA: Diagnosis not present

## 2022-06-07 DIAGNOSIS — Z85810 Personal history of malignant neoplasm of tongue: Secondary | ICD-10-CM | POA: Diagnosis not present

## 2022-06-07 DIAGNOSIS — R932 Abnormal findings on diagnostic imaging of liver and biliary tract: Secondary | ICD-10-CM | POA: Diagnosis not present

## 2022-06-08 ENCOUNTER — Other Ambulatory Visit (HOSPITAL_BASED_OUTPATIENT_CLINIC_OR_DEPARTMENT_OTHER): Payer: Self-pay

## 2022-06-08 MED ORDER — LORAZEPAM 1 MG PO TABS
1.0000 mg | ORAL_TABLET | Freq: Three times a day (TID) | ORAL | 0 refills | Status: DC | PRN
  Filled 2022-06-08: qty 270, 90d supply, fill #0

## 2022-06-14 ENCOUNTER — Other Ambulatory Visit (HOSPITAL_BASED_OUTPATIENT_CLINIC_OR_DEPARTMENT_OTHER): Payer: Self-pay

## 2022-06-19 DIAGNOSIS — C139 Malignant neoplasm of hypopharynx, unspecified: Secondary | ICD-10-CM | POA: Diagnosis not present

## 2022-06-19 DIAGNOSIS — R490 Dysphonia: Secondary | ICD-10-CM | POA: Diagnosis not present

## 2022-07-02 ENCOUNTER — Other Ambulatory Visit (HOSPITAL_BASED_OUTPATIENT_CLINIC_OR_DEPARTMENT_OTHER): Payer: Self-pay

## 2022-07-02 MED ORDER — SERTRALINE HCL 100 MG PO TABS
200.0000 mg | ORAL_TABLET | Freq: Every day | ORAL | 5 refills | Status: DC
  Filled 2022-07-02: qty 60, 30d supply, fill #0
  Filled 2022-12-27: qty 60, 30d supply, fill #1

## 2022-07-03 ENCOUNTER — Other Ambulatory Visit (HOSPITAL_BASED_OUTPATIENT_CLINIC_OR_DEPARTMENT_OTHER): Payer: Self-pay

## 2022-07-10 DIAGNOSIS — Z85819 Personal history of malignant neoplasm of unspecified site of lip, oral cavity, and pharynx: Secondary | ICD-10-CM | POA: Diagnosis not present

## 2022-07-10 DIAGNOSIS — K769 Liver disease, unspecified: Secondary | ICD-10-CM | POA: Diagnosis not present

## 2022-07-10 DIAGNOSIS — Z923 Personal history of irradiation: Secondary | ICD-10-CM | POA: Diagnosis not present

## 2022-07-10 DIAGNOSIS — Z8546 Personal history of malignant neoplasm of prostate: Secondary | ICD-10-CM | POA: Diagnosis not present

## 2022-07-10 DIAGNOSIS — Z87891 Personal history of nicotine dependence: Secondary | ICD-10-CM | POA: Diagnosis not present

## 2022-07-10 DIAGNOSIS — Z8679 Personal history of other diseases of the circulatory system: Secondary | ICD-10-CM | POA: Diagnosis not present

## 2022-07-10 DIAGNOSIS — R1310 Dysphagia, unspecified: Secondary | ICD-10-CM | POA: Diagnosis not present

## 2022-07-10 DIAGNOSIS — C139 Malignant neoplasm of hypopharynx, unspecified: Secondary | ICD-10-CM | POA: Diagnosis not present

## 2022-07-10 DIAGNOSIS — K7689 Other specified diseases of liver: Secondary | ICD-10-CM | POA: Diagnosis not present

## 2022-07-10 DIAGNOSIS — Z951 Presence of aortocoronary bypass graft: Secondary | ICD-10-CM | POA: Diagnosis not present

## 2022-07-12 DIAGNOSIS — I6529 Occlusion and stenosis of unspecified carotid artery: Secondary | ICD-10-CM | POA: Diagnosis not present

## 2022-07-12 DIAGNOSIS — I2510 Atherosclerotic heart disease of native coronary artery without angina pectoris: Secondary | ICD-10-CM | POA: Diagnosis not present

## 2022-07-12 DIAGNOSIS — Z87891 Personal history of nicotine dependence: Secondary | ICD-10-CM | POA: Diagnosis not present

## 2022-07-12 DIAGNOSIS — J3801 Paralysis of vocal cords and larynx, unilateral: Secondary | ICD-10-CM | POA: Diagnosis not present

## 2022-07-12 DIAGNOSIS — E039 Hypothyroidism, unspecified: Secondary | ICD-10-CM | POA: Diagnosis not present

## 2022-07-12 DIAGNOSIS — Z01812 Encounter for preprocedural laboratory examination: Secondary | ICD-10-CM | POA: Diagnosis not present

## 2022-07-12 DIAGNOSIS — C139 Malignant neoplasm of hypopharynx, unspecified: Secondary | ICD-10-CM | POA: Diagnosis not present

## 2022-07-13 ENCOUNTER — Other Ambulatory Visit (HOSPITAL_BASED_OUTPATIENT_CLINIC_OR_DEPARTMENT_OTHER): Payer: Self-pay

## 2022-07-13 MED ORDER — FLUAD QUADRIVALENT 0.5 ML IM PRSY
PREFILLED_SYRINGE | INTRAMUSCULAR | 0 refills | Status: DC
  Filled 2022-07-13: qty 0.5, 1d supply, fill #0

## 2022-07-13 MED ORDER — COMIRNATY 30 MCG/0.3ML IM SUSP
INTRAMUSCULAR | 0 refills | Status: DC
  Filled 2022-07-13: qty 0.3, 1d supply, fill #0

## 2022-07-16 ENCOUNTER — Other Ambulatory Visit (HOSPITAL_BASED_OUTPATIENT_CLINIC_OR_DEPARTMENT_OTHER): Payer: Self-pay

## 2022-07-16 MED ORDER — AMPHETAMINE-DEXTROAMPHETAMINE 20 MG PO TABS
20.0000 mg | ORAL_TABLET | Freq: Three times a day (TID) | ORAL | 0 refills | Status: DC
  Filled 2022-07-24: qty 270, 90d supply, fill #0

## 2022-07-20 DIAGNOSIS — I10 Essential (primary) hypertension: Secondary | ICD-10-CM | POA: Diagnosis not present

## 2022-07-20 DIAGNOSIS — I2510 Atherosclerotic heart disease of native coronary artery without angina pectoris: Secondary | ICD-10-CM | POA: Diagnosis not present

## 2022-07-20 DIAGNOSIS — C139 Malignant neoplasm of hypopharynx, unspecified: Secondary | ICD-10-CM | POA: Diagnosis not present

## 2022-07-20 DIAGNOSIS — R0902 Hypoxemia: Secondary | ICD-10-CM | POA: Diagnosis not present

## 2022-07-20 DIAGNOSIS — T8189XA Other complications of procedures, not elsewhere classified, initial encounter: Secondary | ICD-10-CM | POA: Diagnosis not present

## 2022-07-20 DIAGNOSIS — F419 Anxiety disorder, unspecified: Secondary | ICD-10-CM | POA: Diagnosis not present

## 2022-07-20 DIAGNOSIS — J449 Chronic obstructive pulmonary disease, unspecified: Secondary | ICD-10-CM | POA: Diagnosis not present

## 2022-07-20 DIAGNOSIS — Z951 Presence of aortocoronary bypass graft: Secondary | ICD-10-CM | POA: Diagnosis not present

## 2022-07-20 DIAGNOSIS — J387 Other diseases of larynx: Secondary | ICD-10-CM | POA: Diagnosis not present

## 2022-07-20 DIAGNOSIS — D62 Acute posthemorrhagic anemia: Secondary | ICD-10-CM | POA: Diagnosis not present

## 2022-07-20 DIAGNOSIS — T888XXA Other specified complications of surgical and medical care, not elsewhere classified, initial encounter: Secondary | ICD-10-CM | POA: Diagnosis not present

## 2022-07-20 DIAGNOSIS — I6529 Occlusion and stenosis of unspecified carotid artery: Secondary | ICD-10-CM | POA: Diagnosis not present

## 2022-07-20 DIAGNOSIS — C108 Malignant neoplasm of overlapping sites of oropharynx: Secondary | ICD-10-CM | POA: Diagnosis not present

## 2022-07-20 DIAGNOSIS — I6523 Occlusion and stenosis of bilateral carotid arteries: Secondary | ICD-10-CM | POA: Diagnosis not present

## 2022-07-20 DIAGNOSIS — E785 Hyperlipidemia, unspecified: Secondary | ICD-10-CM | POA: Diagnosis not present

## 2022-07-20 DIAGNOSIS — Z4659 Encounter for fitting and adjustment of other gastrointestinal appliance and device: Secondary | ICD-10-CM | POA: Diagnosis not present

## 2022-07-20 DIAGNOSIS — Z7982 Long term (current) use of aspirin: Secondary | ICD-10-CM | POA: Diagnosis not present

## 2022-07-20 DIAGNOSIS — Z87891 Personal history of nicotine dependence: Secondary | ICD-10-CM | POA: Diagnosis not present

## 2022-07-20 DIAGNOSIS — Z930 Tracheostomy status: Secondary | ICD-10-CM | POA: Diagnosis not present

## 2022-07-20 DIAGNOSIS — F32A Depression, unspecified: Secondary | ICD-10-CM | POA: Diagnosis not present

## 2022-07-21 DIAGNOSIS — Z4659 Encounter for fitting and adjustment of other gastrointestinal appliance and device: Secondary | ICD-10-CM | POA: Diagnosis not present

## 2022-07-21 DIAGNOSIS — J387 Other diseases of larynx: Secondary | ICD-10-CM | POA: Diagnosis not present

## 2022-07-24 ENCOUNTER — Other Ambulatory Visit (HOSPITAL_BASED_OUTPATIENT_CLINIC_OR_DEPARTMENT_OTHER): Payer: Self-pay

## 2022-07-30 DIAGNOSIS — Z9002 Acquired absence of larynx: Secondary | ICD-10-CM | POA: Diagnosis not present

## 2022-07-30 DIAGNOSIS — R1310 Dysphagia, unspecified: Secondary | ICD-10-CM | POA: Diagnosis not present

## 2022-07-30 DIAGNOSIS — J449 Chronic obstructive pulmonary disease, unspecified: Secondary | ICD-10-CM | POA: Diagnosis not present

## 2022-07-30 DIAGNOSIS — C139 Malignant neoplasm of hypopharynx, unspecified: Secondary | ICD-10-CM | POA: Diagnosis not present

## 2022-07-30 DIAGNOSIS — J387 Other diseases of larynx: Secondary | ICD-10-CM | POA: Diagnosis not present

## 2022-07-30 DIAGNOSIS — K219 Gastro-esophageal reflux disease without esophagitis: Secondary | ICD-10-CM | POA: Diagnosis not present

## 2022-07-31 DIAGNOSIS — Z930 Tracheostomy status: Secondary | ICD-10-CM | POA: Diagnosis not present

## 2022-08-02 ENCOUNTER — Other Ambulatory Visit (HOSPITAL_BASED_OUTPATIENT_CLINIC_OR_DEPARTMENT_OTHER): Payer: Self-pay

## 2022-08-02 DIAGNOSIS — Z930 Tracheostomy status: Secondary | ICD-10-CM | POA: Diagnosis not present

## 2022-08-07 DIAGNOSIS — Z930 Tracheostomy status: Secondary | ICD-10-CM | POA: Diagnosis not present

## 2022-08-13 DIAGNOSIS — Z9002 Acquired absence of larynx: Secondary | ICD-10-CM | POA: Diagnosis not present

## 2022-08-13 DIAGNOSIS — J387 Other diseases of larynx: Secondary | ICD-10-CM | POA: Diagnosis not present

## 2022-08-13 DIAGNOSIS — C139 Malignant neoplasm of hypopharynx, unspecified: Secondary | ICD-10-CM | POA: Diagnosis not present

## 2022-08-13 DIAGNOSIS — K7689 Other specified diseases of liver: Secondary | ICD-10-CM | POA: Diagnosis not present

## 2022-08-13 DIAGNOSIS — Z9889 Other specified postprocedural states: Secondary | ICD-10-CM | POA: Diagnosis not present

## 2022-08-14 DIAGNOSIS — Z9002 Acquired absence of larynx: Secondary | ICD-10-CM | POA: Diagnosis not present

## 2022-08-14 DIAGNOSIS — Z9221 Personal history of antineoplastic chemotherapy: Secondary | ICD-10-CM | POA: Diagnosis not present

## 2022-08-14 DIAGNOSIS — Z87891 Personal history of nicotine dependence: Secondary | ICD-10-CM | POA: Diagnosis not present

## 2022-08-14 DIAGNOSIS — Z923 Personal history of irradiation: Secondary | ICD-10-CM | POA: Diagnosis not present

## 2022-08-14 DIAGNOSIS — Z85818 Personal history of malignant neoplasm of other sites of lip, oral cavity, and pharynx: Secondary | ICD-10-CM | POA: Diagnosis not present

## 2022-08-14 DIAGNOSIS — K769 Liver disease, unspecified: Secondary | ICD-10-CM | POA: Diagnosis not present

## 2022-08-14 DIAGNOSIS — Z08 Encounter for follow-up examination after completed treatment for malignant neoplasm: Secondary | ICD-10-CM | POA: Diagnosis not present

## 2022-08-14 DIAGNOSIS — Z931 Gastrostomy status: Secondary | ICD-10-CM | POA: Diagnosis not present

## 2022-08-15 ENCOUNTER — Other Ambulatory Visit (HOSPITAL_BASED_OUTPATIENT_CLINIC_OR_DEPARTMENT_OTHER): Payer: Self-pay

## 2022-08-20 DIAGNOSIS — I351 Nonrheumatic aortic (valve) insufficiency: Secondary | ICD-10-CM | POA: Diagnosis not present

## 2022-08-20 DIAGNOSIS — J387 Other diseases of larynx: Secondary | ICD-10-CM | POA: Diagnosis not present

## 2022-08-20 DIAGNOSIS — E8770 Fluid overload, unspecified: Secondary | ICD-10-CM | POA: Diagnosis not present

## 2022-08-20 DIAGNOSIS — R9431 Abnormal electrocardiogram [ECG] [EKG]: Secondary | ICD-10-CM | POA: Diagnosis not present

## 2022-08-22 ENCOUNTER — Other Ambulatory Visit (HOSPITAL_BASED_OUTPATIENT_CLINIC_OR_DEPARTMENT_OTHER): Payer: Self-pay

## 2022-08-30 DIAGNOSIS — Z87891 Personal history of nicotine dependence: Secondary | ICD-10-CM | POA: Diagnosis not present

## 2022-08-30 DIAGNOSIS — K219 Gastro-esophageal reflux disease without esophagitis: Secondary | ICD-10-CM | POA: Diagnosis not present

## 2022-08-30 DIAGNOSIS — E785 Hyperlipidemia, unspecified: Secondary | ICD-10-CM | POA: Diagnosis not present

## 2022-08-30 DIAGNOSIS — F32A Depression, unspecified: Secondary | ICD-10-CM | POA: Diagnosis not present

## 2022-08-30 DIAGNOSIS — K7689 Other specified diseases of liver: Secondary | ICD-10-CM | POA: Diagnosis not present

## 2022-08-30 DIAGNOSIS — R1310 Dysphagia, unspecified: Secondary | ICD-10-CM | POA: Diagnosis not present

## 2022-08-30 DIAGNOSIS — I951 Orthostatic hypotension: Secondary | ICD-10-CM | POA: Diagnosis not present

## 2022-08-30 DIAGNOSIS — C159 Malignant neoplasm of esophagus, unspecified: Secondary | ICD-10-CM | POA: Diagnosis not present

## 2022-08-30 DIAGNOSIS — E039 Hypothyroidism, unspecified: Secondary | ICD-10-CM | POA: Diagnosis not present

## 2022-08-30 DIAGNOSIS — I10 Essential (primary) hypertension: Secondary | ICD-10-CM | POA: Diagnosis not present

## 2022-08-30 DIAGNOSIS — J387 Other diseases of larynx: Secondary | ICD-10-CM | POA: Diagnosis not present

## 2022-08-30 DIAGNOSIS — G458 Other transient cerebral ischemic attacks and related syndromes: Secondary | ICD-10-CM | POA: Diagnosis not present

## 2022-08-30 DIAGNOSIS — R1011 Right upper quadrant pain: Secondary | ICD-10-CM | POA: Diagnosis not present

## 2022-08-30 DIAGNOSIS — F1090 Alcohol use, unspecified, uncomplicated: Secondary | ICD-10-CM | POA: Diagnosis not present

## 2022-08-30 DIAGNOSIS — R160 Hepatomegaly, not elsewhere classified: Secondary | ICD-10-CM | POA: Diagnosis not present

## 2022-08-30 DIAGNOSIS — I2510 Atherosclerotic heart disease of native coronary artery without angina pectoris: Secondary | ICD-10-CM | POA: Diagnosis not present

## 2022-08-30 DIAGNOSIS — Z9002 Acquired absence of larynx: Secondary | ICD-10-CM | POA: Diagnosis not present

## 2022-08-30 DIAGNOSIS — C787 Secondary malignant neoplasm of liver and intrahepatic bile duct: Secondary | ICD-10-CM | POA: Diagnosis not present

## 2022-08-30 DIAGNOSIS — C138 Malignant neoplasm of overlapping sites of hypopharynx: Secondary | ICD-10-CM | POA: Diagnosis not present

## 2022-08-30 DIAGNOSIS — C801 Malignant (primary) neoplasm, unspecified: Secondary | ICD-10-CM | POA: Diagnosis not present

## 2022-08-30 DIAGNOSIS — J449 Chronic obstructive pulmonary disease, unspecified: Secondary | ICD-10-CM | POA: Diagnosis not present

## 2022-08-30 DIAGNOSIS — C139 Malignant neoplasm of hypopharynx, unspecified: Secondary | ICD-10-CM | POA: Diagnosis not present

## 2022-08-31 DIAGNOSIS — R112 Nausea with vomiting, unspecified: Secondary | ICD-10-CM | POA: Diagnosis not present

## 2022-08-31 DIAGNOSIS — I10 Essential (primary) hypertension: Secondary | ICD-10-CM | POA: Diagnosis not present

## 2022-08-31 DIAGNOSIS — R160 Hepatomegaly, not elsewhere classified: Secondary | ICD-10-CM | POA: Diagnosis not present

## 2022-08-31 DIAGNOSIS — R1012 Left upper quadrant pain: Secondary | ICD-10-CM | POA: Diagnosis not present

## 2022-08-31 DIAGNOSIS — C139 Malignant neoplasm of hypopharynx, unspecified: Secondary | ICD-10-CM | POA: Diagnosis not present

## 2022-08-31 DIAGNOSIS — R0602 Shortness of breath: Secondary | ICD-10-CM | POA: Diagnosis not present

## 2022-08-31 DIAGNOSIS — I2510 Atherosclerotic heart disease of native coronary artery without angina pectoris: Secondary | ICD-10-CM | POA: Diagnosis not present

## 2022-08-31 DIAGNOSIS — E785 Hyperlipidemia, unspecified: Secondary | ICD-10-CM | POA: Diagnosis not present

## 2022-08-31 DIAGNOSIS — K219 Gastro-esophageal reflux disease without esophagitis: Secondary | ICD-10-CM | POA: Diagnosis not present

## 2022-08-31 DIAGNOSIS — E039 Hypothyroidism, unspecified: Secondary | ICD-10-CM | POA: Diagnosis not present

## 2022-08-31 DIAGNOSIS — G458 Other transient cerebral ischemic attacks and related syndromes: Secondary | ICD-10-CM | POA: Diagnosis not present

## 2022-08-31 DIAGNOSIS — K7689 Other specified diseases of liver: Secondary | ICD-10-CM | POA: Diagnosis not present

## 2022-08-31 DIAGNOSIS — I951 Orthostatic hypotension: Secondary | ICD-10-CM | POA: Diagnosis not present

## 2022-09-01 DIAGNOSIS — G458 Other transient cerebral ischemic attacks and related syndromes: Secondary | ICD-10-CM | POA: Diagnosis not present

## 2022-09-01 DIAGNOSIS — K219 Gastro-esophageal reflux disease without esophagitis: Secondary | ICD-10-CM | POA: Diagnosis not present

## 2022-09-01 DIAGNOSIS — E785 Hyperlipidemia, unspecified: Secondary | ICD-10-CM | POA: Diagnosis not present

## 2022-09-01 DIAGNOSIS — R160 Hepatomegaly, not elsewhere classified: Secondary | ICD-10-CM | POA: Diagnosis not present

## 2022-09-01 DIAGNOSIS — I951 Orthostatic hypotension: Secondary | ICD-10-CM | POA: Diagnosis not present

## 2022-09-01 DIAGNOSIS — C139 Malignant neoplasm of hypopharynx, unspecified: Secondary | ICD-10-CM | POA: Diagnosis not present

## 2022-09-01 DIAGNOSIS — E039 Hypothyroidism, unspecified: Secondary | ICD-10-CM | POA: Diagnosis not present

## 2022-09-01 DIAGNOSIS — I2510 Atherosclerotic heart disease of native coronary artery without angina pectoris: Secondary | ICD-10-CM | POA: Diagnosis not present

## 2022-09-01 DIAGNOSIS — I10 Essential (primary) hypertension: Secondary | ICD-10-CM | POA: Diagnosis not present

## 2022-09-01 DIAGNOSIS — K7689 Other specified diseases of liver: Secondary | ICD-10-CM | POA: Diagnosis not present

## 2022-09-04 ENCOUNTER — Telehealth: Payer: Self-pay

## 2022-09-04 ENCOUNTER — Telehealth: Payer: Self-pay | Admitting: Internal Medicine

## 2022-09-04 ENCOUNTER — Ambulatory Visit: Payer: 59 | Admitting: Cardiovascular Disease

## 2022-09-04 NOTE — Telephone Encounter (Signed)
Received phone call from [facility] to scheduled follow up hospital appointment for [name] he was discharged on 09/01/2022 for a liver mass. I have scheduled his hospital follow up appointment for 09/11/2022. Please call today for TCM

## 2022-09-04 NOTE — Telephone Encounter (Signed)
Patient had planned biopsy No TOC required

## 2022-09-04 NOTE — Telephone Encounter (Signed)
Called and spoke with Collie Siad, patients wife to let her know we do not need TOC and hospital visit at this time. This hospital visit was a planned visit for a biopsy and patient is being followed by surgeon and oncologist at [facility] and does not need follow up with PCP. They will call us if they need something in the near future. She also thinks they are keeping a check on his thyroid since they have changed the dose on that as well.

## 2022-09-04 NOTE — Telephone Encounter (Signed)
Left voicemail for patient to call the office to do [facility] call

## 2022-09-07 ENCOUNTER — Other Ambulatory Visit: Payer: Self-pay

## 2022-09-07 ENCOUNTER — Other Ambulatory Visit (HOSPITAL_BASED_OUTPATIENT_CLINIC_OR_DEPARTMENT_OTHER): Payer: Self-pay

## 2022-09-07 DIAGNOSIS — Z930 Tracheostomy status: Secondary | ICD-10-CM | POA: Diagnosis not present

## 2022-09-07 MED ORDER — LORAZEPAM 1 MG PO TABS
1.0000 mg | ORAL_TABLET | Freq: Three times a day (TID) | ORAL | 0 refills | Status: DC | PRN
  Filled 2022-09-07: qty 270, 90d supply, fill #0

## 2022-09-11 ENCOUNTER — Inpatient Hospital Stay: Payer: 59 | Admitting: Internal Medicine

## 2022-09-11 DIAGNOSIS — J387 Other diseases of larynx: Secondary | ICD-10-CM | POA: Diagnosis not present

## 2022-09-11 DIAGNOSIS — R978 Other abnormal tumor markers: Secondary | ICD-10-CM | POA: Diagnosis not present

## 2022-09-11 DIAGNOSIS — R1310 Dysphagia, unspecified: Secondary | ICD-10-CM | POA: Diagnosis not present

## 2022-09-11 DIAGNOSIS — C139 Malignant neoplasm of hypopharynx, unspecified: Secondary | ICD-10-CM | POA: Diagnosis not present

## 2022-09-25 DIAGNOSIS — Z930 Tracheostomy status: Secondary | ICD-10-CM | POA: Diagnosis not present

## 2022-09-26 ENCOUNTER — Other Ambulatory Visit: Payer: Self-pay

## 2022-09-26 ENCOUNTER — Other Ambulatory Visit (HOSPITAL_BASED_OUTPATIENT_CLINIC_OR_DEPARTMENT_OTHER): Payer: Self-pay

## 2022-09-26 ENCOUNTER — Other Ambulatory Visit: Payer: Self-pay | Admitting: Internal Medicine

## 2022-09-26 MED ORDER — AMLODIPINE BESYLATE 10 MG PO TABS
ORAL_TABLET | Freq: Every day | ORAL | 3 refills | Status: DC
  Filled 2022-09-26: qty 90, 90d supply, fill #0

## 2022-09-27 ENCOUNTER — Other Ambulatory Visit (HOSPITAL_BASED_OUTPATIENT_CLINIC_OR_DEPARTMENT_OTHER): Payer: Self-pay

## 2022-09-28 ENCOUNTER — Other Ambulatory Visit (HOSPITAL_BASED_OUTPATIENT_CLINIC_OR_DEPARTMENT_OTHER): Payer: Self-pay

## 2022-09-28 DIAGNOSIS — C139 Malignant neoplasm of hypopharynx, unspecified: Secondary | ICD-10-CM | POA: Diagnosis not present

## 2022-09-28 DIAGNOSIS — K8080 Other cholelithiasis without obstruction: Secondary | ICD-10-CM | POA: Diagnosis not present

## 2022-09-28 DIAGNOSIS — K7689 Other specified diseases of liver: Secondary | ICD-10-CM | POA: Diagnosis not present

## 2022-09-29 ENCOUNTER — Other Ambulatory Visit (HOSPITAL_COMMUNITY): Payer: Self-pay

## 2022-09-29 ENCOUNTER — Other Ambulatory Visit (HOSPITAL_BASED_OUTPATIENT_CLINIC_OR_DEPARTMENT_OTHER): Payer: Self-pay

## 2022-09-29 DIAGNOSIS — J449 Chronic obstructive pulmonary disease, unspecified: Secondary | ICD-10-CM | POA: Diagnosis not present

## 2022-09-29 DIAGNOSIS — R1310 Dysphagia, unspecified: Secondary | ICD-10-CM | POA: Diagnosis not present

## 2022-09-29 DIAGNOSIS — Z9002 Acquired absence of larynx: Secondary | ICD-10-CM | POA: Diagnosis not present

## 2022-09-29 DIAGNOSIS — C139 Malignant neoplasm of hypopharynx, unspecified: Secondary | ICD-10-CM | POA: Diagnosis not present

## 2022-09-29 DIAGNOSIS — J387 Other diseases of larynx: Secondary | ICD-10-CM | POA: Diagnosis not present

## 2022-09-29 DIAGNOSIS — K219 Gastro-esophageal reflux disease without esophagitis: Secondary | ICD-10-CM | POA: Diagnosis not present

## 2022-10-19 LAB — HM COLONOSCOPY

## 2022-10-25 ENCOUNTER — Telehealth: Payer: Self-pay | Admitting: Cardiovascular Disease

## 2022-10-25 NOTE — Telephone Encounter (Signed)
*  STAT* If patient is at the pharmacy, call can be transferred to refill team.   1. Which medications need to be refilled? (please list name of each medication and dose if known)  Alirocumab (PRALUENT) 150 MG/ML SOAJ  2. Which pharmacy/location (including street and city if local pharmacy) is medication to be sent to? [place]  3. Do they need a 30 day or 90 day supply?  30 day supply

## 2022-10-26 ENCOUNTER — Other Ambulatory Visit (HOSPITAL_BASED_OUTPATIENT_CLINIC_OR_DEPARTMENT_OTHER): Payer: Self-pay

## 2022-10-26 ENCOUNTER — Encounter: Payer: Self-pay | Admitting: Internal Medicine

## 2022-10-26 MED ORDER — PRALUENT 150 MG/ML ~~LOC~~ SOAJ
150.0000 mg | SUBCUTANEOUS | 11 refills | Status: DC
  Filled 2022-10-26 – 2022-11-01 (×2): qty 2, 28d supply, fill #0
  Filled 2022-11-29: qty 2, 28d supply, fill #1

## 2022-10-26 MED ORDER — FERROUS SULFATE 325 (65 FE) MG PO TBEC
325.0000 mg | DELAYED_RELEASE_TABLET | Freq: Two times a day (BID) | ORAL | 2 refills | Status: DC
  Filled 2022-10-26: qty 90, 45d supply, fill #0

## 2022-10-26 NOTE — Telephone Encounter (Signed)
Refill sent in

## 2022-11-01 ENCOUNTER — Other Ambulatory Visit (HOSPITAL_BASED_OUTPATIENT_CLINIC_OR_DEPARTMENT_OTHER): Payer: Self-pay

## 2022-11-01 ENCOUNTER — Other Ambulatory Visit: Payer: Self-pay | Admitting: Internal Medicine

## 2022-11-01 MED ORDER — BUPROPION HCL ER (XL) 150 MG PO TB24
150.0000 mg | ORAL_TABLET | Freq: Every morning | ORAL | 4 refills | Status: DC
  Filled 2022-11-01: qty 90, 90d supply, fill #0
  Filled 2023-02-03: qty 90, 90d supply, fill #1

## 2022-11-01 MED ORDER — LEVOTHYROXINE SODIUM 75 MCG PO TABS
75.0000 ug | ORAL_TABLET | Freq: Every day | ORAL | 1 refills | Status: DC
  Filled 2022-11-01: qty 90, 90d supply, fill #0

## 2022-11-01 MED ORDER — AMPHETAMINE-DEXTROAMPHETAMINE 20 MG PO TABS
20.0000 mg | ORAL_TABLET | Freq: Three times a day (TID) | ORAL | 0 refills | Status: DC
  Filled 2022-11-01: qty 270, 90d supply, fill #0

## 2022-11-01 MED ORDER — SOLIFENACIN SUCCINATE 5 MG PO TABS
5.0000 mg | ORAL_TABLET | Freq: Every day | ORAL | 5 refills | Status: DC
  Filled 2022-11-01: qty 30, 30d supply, fill #0

## 2022-11-01 MED ORDER — SERTRALINE HCL 100 MG PO TABS
100.0000 mg | ORAL_TABLET | Freq: Every day | ORAL | 4 refills | Status: AC
  Filled 2022-11-01: qty 90, 90d supply, fill #0
  Filled 2022-12-26 – 2023-02-21 (×2): qty 90, 90d supply, fill #1

## 2022-11-05 ENCOUNTER — Ambulatory Visit: Payer: 59 | Admitting: Dermatology

## 2022-11-09 ENCOUNTER — Other Ambulatory Visit (HOSPITAL_BASED_OUTPATIENT_CLINIC_OR_DEPARTMENT_OTHER): Payer: Self-pay

## 2022-11-09 MED ORDER — LEVOTHYROXINE SODIUM 88 MCG PO TABS
88.0000 ug | ORAL_TABLET | Freq: Every morning | ORAL | 0 refills | Status: DC
  Filled 2022-11-09: qty 60, 60d supply, fill #0

## 2022-11-20 ENCOUNTER — Other Ambulatory Visit (HOSPITAL_COMMUNITY): Payer: Self-pay

## 2022-11-20 ENCOUNTER — Telehealth: Payer: Self-pay | Admitting: Pharmacist Clinician (PhC)/ Clinical Pharmacy Specialist

## 2022-11-20 NOTE — Telephone Encounter (Signed)
Pt received letter stating his insurance plan has switched to Repatha as their preferred [id number].  Could you please do the updated PA/

## 2022-11-21 ENCOUNTER — Telehealth: Payer: Self-pay

## 2022-11-21 ENCOUNTER — Other Ambulatory Visit (HOSPITAL_COMMUNITY): Payer: Self-pay

## 2022-11-21 NOTE — Telephone Encounter (Signed)
Pharmacy Patient Advocate Encounter   Per Test Claim: NO P/A IS required for Repatha 140MG/ML.  Authorization is effective til 05/21/23!

## 2022-11-22 ENCOUNTER — Telehealth: Payer: Self-pay

## 2022-11-22 NOTE — Telephone Encounter (Signed)
Pharmacy Patient Advocate Encounter  Prior Authorization for REPATHA 140 MG/ML INJ has been approved.    PA# [id number] Effective dates: 11/20/22 through 05/21/23   Received notification from [facility] that prior authorization for REPATHA 140 MG/ML INJ is needed.    PA submitted on 11/20/22 Key [id number] Status is pending  Karie Soda, [facility] Patient Advocate Specialist Direct Number: [phone] Fax: [phone]

## 2022-11-28 ENCOUNTER — Ambulatory Visit: Payer: Medicare Other | Attending: Cardiovascular Disease | Admitting: Cardiovascular Disease

## 2022-11-28 ENCOUNTER — Encounter: Payer: Self-pay | Admitting: Cardiovascular Disease

## 2022-11-28 ENCOUNTER — Other Ambulatory Visit (HOSPITAL_BASED_OUTPATIENT_CLINIC_OR_DEPARTMENT_OTHER): Payer: Self-pay

## 2022-11-28 VITALS — BP 98/52 | HR 56 | Ht 66.0 in | Wt 149.8 lb

## 2022-11-28 DIAGNOSIS — I2510 Atherosclerotic heart disease of native coronary artery without angina pectoris: Secondary | ICD-10-CM | POA: Diagnosis present

## 2022-11-28 DIAGNOSIS — I6523 Occlusion and stenosis of bilateral carotid arteries: Secondary | ICD-10-CM | POA: Diagnosis present

## 2022-11-28 DIAGNOSIS — E782 Mixed hyperlipidemia: Secondary | ICD-10-CM

## 2022-11-28 DIAGNOSIS — I10 Essential (primary) hypertension: Secondary | ICD-10-CM | POA: Diagnosis present

## 2022-11-28 MED ORDER — AMLODIPINE BESYLATE 2.5 MG PO TABS
2.5000 mg | ORAL_TABLET | Freq: Every day | ORAL | 3 refills | Status: DC
  Filled 2022-11-28: qty 90, 90d supply, fill #0
  Filled 2023-02-21: qty 90, 90d supply, fill #1
  Filled 2023-07-28: qty 90, 90d supply, fill #2

## 2022-11-28 NOTE — Assessment & Plan Note (Signed)
History of carotid artery disease status post carotid artery stenting in the past.  His most recent carotid Dopplers performed 03/13/2022 revealed a patent right carotid stent with no evidence of left ICA stenosis.  This will be repeated in June of this year.

## 2022-11-28 NOTE — Assessment & Plan Note (Signed)
History of essential hypertension blood pressure today at 98/52.  He stopped all of his antihypertensive tensive medications including hydrochlorothiazide, amlodipine and metoprolol because of orthostatic hypotension.  I reviewed his blood pressure log which reveals blood pressures that are all over the board however he does have blood pressures on average in the 1 30-1 50.  His symptoms of dizziness have resolved.  I am going to start him back on low-dose amlodipine 2 point milligrams a day and have him keep a 30 of blood pressure log.  He will see a Pharm.D. back in 4 weeks to review and make appropriate medication changes.

## 2022-11-28 NOTE — Assessment & Plan Note (Signed)
History of hyperlipidemia on Praluent with lipid profile performed 04/19/2022 revealing total cholesterol 149, LDL 47 and HDL 82.

## 2022-11-28 NOTE — Patient Instructions (Signed)
Medication Instructions:  Your physician has recommended you make the following change in your medication:   -Start amlodipine (norvasc) 2.'5mg'$  once daily.  *If you need a refill on your cardiac medications before your next appointment, please call your pharmacy*    Testing/Procedures: Your physician has requested that you have a carotid duplex. This test is an ultrasound of the carotid arteries in your neck. It looks at blood flow through these arteries that supply the brain with blood. Allow one hour for this exam. There are no restrictions or special instructions. This will take place at [address], [address]. To be done in June.     Follow-Up: At [facility], you and your health needs are our priority.  As part of our continuing mission to provide you with exceptional heart care, we have created designated Provider Care Teams.  These Care Teams include your primary Cardiologist (physician) and Advanced Practice Providers (APPs -  Physician Assistants and Nurse Practitioners) who all work together to provide you with the care you need, when you need it.  We recommend signing up for the patient portal called "MyChart".  Sign up information is provided on this After Visit Summary.  MyChart is used to connect with patients for Virtual Visits (Telemedicine).  Patients are able to view lab/test results, encounter notes, upcoming appointments, etc.  Non-urgent messages can be sent to your provider as well.   To learn more about what you can do with MyChart, go to [email or web address].    Your next appointment:   3 month(s)  Provider:   Fabian Sharp, PA-C, Sande Rives, PA-C, Caron Presume, PA-C, Jory Sims, DNP, ANP, Almyra Deforest, PA-C, or Diona Browner, NP      Then, Quay Burow, MD will plan to see you again in 12 month(s).   Other Instructions Dr. Gwenlyn Found has requested that you schedule an appointment with one of our clinical pharmacists for a blood pressure  check appointment within the next 4 weeks.  If you monitor your blood pressure (BP) at home, please bring your BP cuff and your BP readings with you to this appointment  HOW TO TAKE YOUR BLOOD PRESSURE: Rest 5 minutes before taking your blood pressure. Don't smoke or drink caffeinated beverages for at least 30 minutes before. Take your blood pressure before (not after) you eat. Sit comfortably with your back supported and both feet on the floor (don't cross your legs). Elevate your arm to heart level on a table or a desk. Use the proper sized cuff. It should fit smoothly and snugly around your bare upper arm. There should be enough room to slip a fingertip under the cuff. The bottom edge of the cuff should be 1 inch above the crease of the elbow. Ideally, take 3 measurements at one sitting and record the average.

## 2022-11-28 NOTE — Progress Notes (Signed)
11/28/2022 [name]   September 28, 1951  [id number]  Primary Physician Baxley, Cresenciano Lick, MD Primary Cardiologist: [name] MD Garret Reddish, [place], [place]  HPI:  [name] is a 72 y.o.   thin-appearing, married Caucasian male, father of 55, grandfather to 4 grandchildren who I last saw in the office 11/23/2020.Marland Kitchen  He is accompanied by his wife [name] today.  He has a history of coronary artery disease status post bypass grafting x5 by Dr. Merilynn Finland June 16, 2008, with a free LIMA to his LAD, sequential vein to the first diagonal branch, first and second obtuse marginal branches, as well as a vein to the PDA. His other problems include hypertension and hyperlipidemia. He denies chest pain or shortness of breath. I did perform cerebral angiography on him May 05, 2008, because of a question of a high-grade internal carotid artery stenosis read by the radiologist. However, this turned out to be a left external carotid artery stenosis. He does have high-grade ostial left internal mammary artery stenosis which necessitated the use of a free LIMA. He is statin intolerant. 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 A Myoview stress test performed December 11, 2011, was normal and carotid Dopplers did show a moderately severe right ICA stenosis which we have been following by duplex ultrasound. 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 Since I saw him 12 months ago he has noticed increasing dyspnea on exertion over the last several months, especially noticeable while mowing his lawn. I obtained a Myoview stress test on him based on this 03/29/16 that showed a large reversible lateral wall perfusion defect  consistent with ischemia. He underwent outpatient cardiac  catheterization by myself on 05/14/16 revealing severe native and graft vessel disease with normal LV function. Specifically, his RCA vein graft was occluded to an occluded dominant right and his circumflex vein graft which was sequential to 2 obtuse marginal branches was occluded at the aorta to patent continuation between OM1 and 2. He did have native circumflex disease. Dr. Roxan Hockey saw him during his brief hospitalization to discuss the possibility of redo coronary artery bypass grafting although the consensus was that he would be better served with attempted PCI and stenting of his native circumflex coronary artery. I perform this electively on 06/14/16 and implanted a 2.25 mm x 38 mm long synergy drug-eluting stent in his AV groove circumflex into his obtuse marginal branch. He had excellent angiographic and clinical result. He was discharged on the following day. His dyspnea ultimately resolved. Since I saw him in the office 7 months ago he's remained clinically stable. He does have a right shoulder orthopedic issue but apparently he is not a surgical candidate.     He does complain of some breathing abnormality which does not does not necessarily sound like shortness of breath.  He had an excellent lipid profile performed by his PCP on 12/04/2018 with a total cholesterol 162, LDL 68 and HDL 72 on Praluent.  He has developed increasing PSA to 13 and ultimately underwent radical prostatectomy.  He is currently on hormonal therapy for this.   Since I saw him a year ago he has undergone a total laryngectomy in October of last year as well as partial thyroidectomy.  From a  heart point of view he is remained stable.  He denies chest pain or shortness of breath.  He was having orthostatic hypotension and stopped his antihypertensive medications.  His blood pressures increased after that and his dizziness resolved.  He recently has transition from milkshakes to solid food.  His weight initially went down and then  stabilized.  Current Meds  Medication Sig   albuterol (VENTOLIN HFA) 108 (90 Base) MCG/ACT inhaler Inhale 2 puffs into the lungs every 6 (six) hours as needed for wheezing or shortness of breath.   Alirocumab (PRALUENT) 150 MG/ML SOAJ Inject 150 mg into the skin every 14 (fourteen) days.   amphetamine-dextroamphetamine (ADDERALL) 20 MG tablet Take 1 tablet (20 mg total) by mouth 3 (three) times daily.   amphetamine-dextroamphetamine (ADDERALL) 20 MG tablet Take 1 tablet (20 mg total) by mouth 3 (three) times daily.   aspirin EC 81 MG tablet Take 81 mg by mouth daily. Swallow whole.   buPROPion (WELLBUTRIN XL) 150 MG 24 hr tablet Take 1 tablet (150 mg total) by mouth in the morning.   COVID-19 mRNA bivalent vaccine, Pfizer, (PFIZER COVID-19 VAC BIVALENT) injection Inject into the muscle.   COVID-19 mRNA Vac-TriS, Pfizer, (COMIRNATY) SUSP injection Inject into the muscle.   influenza vaccine adjuvanted (FLUAD QUADRIVALENT) 0.5 ML injection Inject into the muscle.   influenza vaccine adjuvanted (FLUAD) 0.5 ML injection Inject into the muscle.   levothyroxine (SYNTHROID) 88 MCG tablet Take 1 tablet (88 mcg total) by mouth every morning.   LORazepam (ATIVAN) 1 MG tablet Take 1 tablet by mouth three times daily as needed   metoprolol tartrate (LOPRESSOR) 25 MG tablet Take 1/2 tablet (12.5 mg total) by mouth 2 (two) times daily. (Patient taking differently: Take 25 mg by mouth 2 (two) times daily. Take 1/2  tablet by mouth twice daily)   pantoprazole (PROTONIX) 40 MG tablet TAKE 1 TABLET (40 MG TOTAL) BY MOUTH DAILY.   sertraline (ZOLOFT) 100 MG tablet Take 2 tablets (200 mg total) by mouth daily.   sertraline (ZOLOFT) 100 MG tablet Take 1 tablet (100 mg total) by mouth daily.   [DISCONTINUED] hydrochlorothiazide (MICROZIDE) 12.5 MG capsule Take 1 capsule (12.5 mg total) by mouth daily.   Current Facility-Administered Medications for the 11/28/22 encounter (Office Visit) with [name], MD   Medication   ticagrelor [facility]) tablet 90 mg     Allergies  Allergen Reactions   Crestor [Rosuvastatin Calcium] Other (See Comments)    Difficulty swallowing   Tramadol Shortness Of Breath   Lisinopril Cough   Gabapentin Other (See Comments)    Hallucinations   Other    Tricor [Fenofibrate] Other (See Comments)    UNSPECIFIED REACTION   Lipitor [Atorvastatin Calcium] Other (See Comments)    Muscle spasms    Social History   Socioeconomic History   Marital status: Married    Spouse name: Not on file   Number of children: Not on file   Years of education: Not on file   Highest education [name]: Not on file  Occupational History   Not on file  Tobacco Use   Smoking status: Former    Packs/day: 3.00    Years: 30.00    Total pack years: 90.00    Types: Cigarettes    Quit date: 01/29/2001    Years since quitting: 21.8   Smokeless tobacco: Never  Vaping Use   Vaping Use: Never used  Substance and Sexual Activity   Alcohol use: Yes    Alcohol/week:  21.0 standard drinks of alcohol    Types: 21 Cans of beer per week    Comment: 2-3 day beers   Drug use: No   Sexual activity: Not on file  Other Topics Concern   Not on file  Social History Narrative   Not on file   Social Determinants of Health   Financial Resource Strain: Not on file  Food Insecurity: Not on file  Transportation Needs: Not on file  Physical Activity: Not on file  Stress: Not on file  Social Connections: Not on file  Intimate Partner Violence: Not on file     Review of Systems: General: negative for chills, fever, night sweats or weight changes.  Cardiovascular: negative for chest pain, dyspnea on exertion, edema, orthopnea, palpitations, paroxysmal nocturnal dyspnea or shortness of breath Dermatological: negative for rash Respiratory: negative for cough or wheezing Urologic: negative for hematuria Abdominal: negative for nausea, vomiting, diarrhea, bright red blood per rectum, melena, or  hematemesis Neurologic: negative for visual changes, syncope, or dizziness All other systems reviewed and are otherwise negative except as noted above.    Blood pressure (!) 98/52, pulse (!) 56, height '5\' 6"'$  (1.676 m), weight 149 lb 12.8 oz (67.9 kg), SpO2 95 %.  General appearance: alert and no distress Neck: no adenopathy, no carotid bruit, no JVD, supple, symmetrical, trachea midline, and thyroid not enlarged, symmetric, no tenderness/mass/nodules Lungs: clear to auscultation bilaterally Heart: regular rate and rhythm, S1, S2 normal, no murmur, click, rub or gallop Extremities: extremities normal, atraumatic, no cyanosis or edema Pulses: 2+ and symmetric Skin: Skin color, texture, turgor normal. No rashes or lesions Neurologic: Grossly normal  EKG sinus bradycardia 56 with left axis deviation and right bundle branch block unchanged from prior EKGs.  I personally reviewed this EKG.  ASSESSMENT AND PLAN:   Coronary artery disease History of CAD status post coronary artery bypass grafting x 4 by Dr. Roxan Hockey 06/16/2008 with a free LIMA to his LAD, sequential vein to the first diagonal branch, first and second obtuse marginal branches as well as the vein to the PDA.  He underwent outpatient cardiac catheterization by myself 05/15/2015 revealing severe native and graft vessel disease.  Specifically, his RCA vein graft was occluded to a dominant RCA and the circumflex vein graft which was sequential to 2 obtuse marginal branches was occluded at the aorta and patent at the continuation between 1 and 2.  He had severe native circumflex disease.  We talked about redo bypass surgery however the consensus was attempt at circumflex intervention which I performed on 06/14/2016 with a 2.5 mm x 38 mm long Synergy drug-eluting stent in his AV groove circumflex into his obtuse marginal branch.  Excellent angiographic and clinical result.  His symptoms resolved at that time.  He had no recurrent  symptoms.  Hypertension History of essential hypertension blood pressure today at 98/52.  He stopped all of his antihypertensive tensive medications including hydrochlorothiazide, amlodipine and metoprolol because of orthostatic hypotension.  I reviewed his blood pressure log which reveals blood pressures that are all over the board however he does have blood pressures on average in the 1 30-1 50.  His symptoms of dizziness have resolved.  I am going to start him back on low-dose amlodipine 2 point milligrams a day and have him keep a 30 of blood pressure log.  He will see a Pharm.D. back in 4 weeks to review and make appropriate medication changes.  Internal carotid artery stenosis History of carotid artery disease  status post carotid artery stenting in the past.  His most recent carotid Dopplers performed 03/13/2022 revealed a patent right carotid stent with no evidence of left ICA stenosis.  This will be repeated in June of this year.  Hyperlipidemia History of hyperlipidemia on Praluent with lipid profile performed 04/19/2022 revealing total cholesterol 149, LDL 47 and HDL 82.     [name] MD [facility], [facility] 11/28/2022 3:55 PM

## 2022-11-28 NOTE — Assessment & Plan Note (Signed)
History of CAD status post coronary artery bypass grafting x 4 by Dr. Roxan Hockey 06/16/2008 with a free LIMA to his LAD, sequential vein to the first diagonal branch, first and second obtuse marginal branches as well as the vein to the PDA.  He underwent outpatient cardiac catheterization by myself 05/15/2015 revealing severe native and graft vessel disease.  Specifically, his RCA vein graft was occluded to a dominant RCA and the circumflex vein graft which was sequential to 2 obtuse marginal branches was occluded at the aorta and patent at the continuation between 1 and 2.  He had severe native circumflex disease.  We talked about redo bypass surgery however the consensus was attempt at circumflex intervention which I performed on 06/14/2016 with a 2.5 mm x 38 mm long Synergy drug-eluting stent in his AV groove circumflex into his obtuse marginal branch.  Excellent angiographic and clinical result.  His symptoms resolved at that time.  He had no recurrent symptoms.

## 2022-11-29 NOTE — Addendum Note (Signed)
Addended by: Brantley Persons A on: 11/29/2022 11:20 AM   Modules accepted: Orders

## 2022-11-30 ENCOUNTER — Other Ambulatory Visit (HOSPITAL_BASED_OUTPATIENT_CLINIC_OR_DEPARTMENT_OTHER): Payer: Self-pay

## 2022-11-30 MED ORDER — REPATHA SURECLICK 140 MG/ML ~~LOC~~ SOAJ
140.0000 mg | SUBCUTANEOUS | 3 refills | Status: DC
  Filled 2022-11-30: qty 6, 84d supply, fill #0
  Filled 2023-02-21: qty 6, 84d supply, fill #1
  Filled 2023-05-16: qty 6, 84d supply, fill #2
  Filled 2023-08-22: qty 6, 84d supply, fill #3

## 2022-12-12 ENCOUNTER — Encounter: Payer: Self-pay | Admitting: Cardiovascular Disease

## 2022-12-26 ENCOUNTER — Other Ambulatory Visit: Payer: Self-pay

## 2022-12-27 ENCOUNTER — Other Ambulatory Visit (HOSPITAL_BASED_OUTPATIENT_CLINIC_OR_DEPARTMENT_OTHER): Payer: Self-pay

## 2022-12-27 ENCOUNTER — Other Ambulatory Visit: Payer: Self-pay

## 2023-01-01 ENCOUNTER — Encounter: Payer: Self-pay | Admitting: Student

## 2023-01-01 ENCOUNTER — Ambulatory Visit: Payer: Medicare Other | Attending: Cardiology | Admitting: Student

## 2023-01-01 VITALS — BP 115/65 | HR 69

## 2023-01-01 DIAGNOSIS — I10 Essential (primary) hypertension: Secondary | ICD-10-CM

## 2023-01-01 NOTE — Progress Notes (Signed)
Patient ID: [name]                 DOB: 02-Sep-1951                      MRN: [id number]      HPI: [name] is a 72 y.o. male referred by Dr. Gwenlyn Found to HTN clinic. PMH is significant for CADstatus post coronary artery bypass grafting x 4 by Dr. Roxan Hockey 06/16/2008 , carotid artery stenosis, HDL,total laryngectomy and partial thyroidectomy.   Patient saw Dr.Berry on 11/28/2022, reported he had stopped taking all his BP medications including hydrochlorothiazide, amlodipine and metoprolol because of orthostatic hypotension. Home BP ~130-150 range so amlodipine 2.5 mg was started.  Patient presented today for HTN follow up. Brought in home log; home BP is all over the place. Highest BP reading of 174/96 with HR 93 and lowest 127/77 with heart rate 73. Reports his home cuff is about 71 years old. Patient tolerates low dose amlodipine well without any trouble. He is on liquid diet and lost around 20 lbs. Patient is very active around the house and stays busy will wood work.    Current HTN meds: amlodipine 2.5 mg daily  Previously tried: hydrochlorothiazide, amlodipine and metoprolol  BP goal: <130/80   Diet: on liquid diet   Exercise: busy around the house with his woodwork     Wt Readings from Last 3 Encounters:  11/28/22 149 lb 12.8 oz (67.9 kg)  04/30/22 147 lb 12 oz (67 kg)  03/13/22 148 lb 4.8 oz (67.3 kg)   BP Readings from Last 3 Encounters:  01/01/23 115/65  11/28/22 (!) 98/52  04/30/22 136/68   Pulse Readings from Last 3 Encounters:  01/01/23 69  11/28/22 (!) 56  04/30/22 63    Renal function: CrCl cannot be calculated (Patient's most recent lab result is older than the maximum 21 days allowed.).  Past Medical History:  Diagnosis Date   ADHD (attention deficit hyperactivity disorder)    Anxiety    Arthritis    "NECK, HANDS, KNEES" (06/14/2016)   Atypical mole 08/07/2021   Mid Back (Atypical Proliferation)   CAD (coronary artery disease)    Carotid  artery stenosis    Complication of anesthesia    Reports that he had trouble swallowing post anesthesia- CABG   COPD (chronic obstructive pulmonary disease) (HCC) 12/11/2011   r/s mv - EF 72%; exercise capacity 13 METS; no exercised induced ischemic EKG changes   Depression, major    Dyspnea    with exertion   GERD (gastroesophageal reflux disease)    HTN (hypertension), benign 04/16/2008   echo - EF >55%; no regional wall or valvular abnormalities   Hyperlipidemia    statin intolerant   Hypothyroidism    Migraine    "crippling headaches as a kid; silent migraines now, no pain, couple times/week" (06/14/2016)   Peripheral vascular disease ([place])    Prostate cancer ([place]) 04/2019   recent dx - Dr Lawerance Bach   S/P angioplasty with stent 06/14/16 PCI & DES of high grade AV groove LCX and 1st OM  06/15/2016   Squamous cell cancer of tongue ([place]) ~ 2002   "35 radiation treatments at [facility]"   Subclavian steal syndrome 06/17/2012   carotid doppler - R systolic brachial pressure 141mmHg, L 134mmHg; R subclavian artery - proxmial obsstruction w/ abnormal monophasic waveforms, R ECA known occlusive disease; L ECA narrowing w/ 70-99% diameter reductiona   TIA (  transient ischemic attack) 2005   Viral hepatitis 1970s   "non specific"    Current Outpatient Medications on File Prior to Visit  Medication Sig Dispense Refill   albuterol (VENTOLIN HFA) 108 (90 Base) MCG/ACT inhaler Inhale 2 puffs into the lungs every 6 (six) hours as needed for wheezing or shortness of breath. 18 g 5   amLODipine (NORVASC) 2.5 MG tablet Take 1 tablet (2.5 mg total) by mouth daily. 90 tablet 3   amphetamine-dextroamphetamine (ADDERALL) 20 MG tablet Take 1 tablet (20 mg total) by mouth 3 (three) times daily. 270 tablet 0   amphetamine-dextroamphetamine (ADDERALL) 20 MG tablet Take 1 tablet (20 mg total) by mouth 3 (three) times daily. (Patient not taking: Reported on 11/28/2022) 270 tablet 0    amphetamine-dextroamphetamine (ADDERALL) 20 MG tablet Take 1 tablet (20 mg total) by mouth 3 (three) times daily. 270 tablet 0   aspirin EC 81 MG tablet Take 81 mg by mouth daily. Swallow whole.     buPROPion (WELLBUTRIN XL) 150 MG 24 hr tablet Take 1 tablet (150 mg total) by mouth in the morning. 90 tablet 4   COVID-19 mRNA bivalent vaccine, Pfizer, (PFIZER COVID-19 VAC BIVALENT) injection Inject into the muscle. 0.3 mL 0   COVID-19 mRNA Vac-TriS, Pfizer, (COMIRNATY) SUSP injection Inject into the muscle. 0.3 mL 0   Evolocumab (REPATHA SURECLICK) [id number] MG/ML SOAJ Inject 140 mg into the skin every 14 (fourteen) days. 6 mL 3   ferrous sulfate 325 (65 FE) MG EC tablet Take 1 tablet (325 mg total) by mouth 2 (two) times daily after a meal. Can take the treatment every other day to minimize side-effects. (Patient not taking: Reported on 11/28/2022) 90 tablet 2   influenza vaccine adjuvanted (FLUAD QUADRIVALENT) 0.5 ML injection Inject into the muscle. 0.5 mL 0   influenza vaccine adjuvanted (FLUAD) 0.5 ML injection Inject into the muscle. 0.5 mL 0   levothyroxine (SYNTHROID) 75 MCG tablet Take 1 tablet (75 mcg total) by mouth daily. (Patient not taking: Reported on 11/28/2022) 90 tablet 1   levothyroxine (SYNTHROID) 88 MCG tablet Take 1 tablet (88 mcg total) by mouth every morning. 60 tablet 0   LORazepam (ATIVAN) 1 MG tablet Take 1 tablet by mouth three times daily as needed 270 tablet 1   LORazepam (ATIVAN) 1 MG tablet Take 1 tablet (1 mg total) by mouth 3 (three) times daily as needed. (Patient not taking: Reported on 11/28/2022) 270 tablet 0   metoprolol tartrate (LOPRESSOR) 25 MG tablet Take 1/2 tablet (12.5 mg total) by mouth 2 (two) times daily. (Patient taking differently: Take 25 mg by mouth 2 (two) times daily. Take 1/2  tablet by mouth twice daily) 90 tablet 3   pantoprazole (PROTONIX) 40 MG tablet TAKE 1 TABLET (40 MG TOTAL) BY MOUTH DAILY. 90 tablet 3   sertraline (ZOLOFT) 100 MG tablet Take 2  tablets (200 mg total) by mouth daily. 60 tablet 5   sertraline (ZOLOFT) 100 MG tablet Take 1 tablet (100 mg total) by mouth daily. 90 tablet 4   solifenacin (VESICARE) 5 MG tablet Take 1 tablet (5 mg total) by mouth daily. (Patient not taking: Reported on 11/28/2022) 30 tablet 5   [DISCONTINUED] tadalafil (CIALIS) 5 MG tablet Take 1 tablet (5 mg total) by mouth daily. Take 3-5 times a week for penile rehabilitation. May take up to 20mg  as needed for intercourse. 30 tablet 2   Current Facility-Administered Medications on File Prior to Visit  Medication Dose Route Frequency Provider  Last Rate Last Admin   ticagrelor (BRILINTA) tablet 90 mg  90 mg Oral BID Cherre Robins, MD        Allergies  Allergen Reactions   Crestor [Rosuvastatin Calcium] Other (See Comments)    Difficulty swallowing   Tramadol Shortness Of Breath   Lisinopril Cough   Gabapentin Other (See Comments)    Hallucinations   Other    Tricor [Fenofibrate] Other (See Comments)    UNSPECIFIED REACTION   Lipitor [Atorvastatin Calcium] Other (See Comments)    Muscle spasms    Blood pressure 115/65, pulse 69, SpO2 100 %.   Hypertension Assessment: BP is controlled in office BP 115/65 mmHg heart rate 69 (goal <130/80) home BP is all over the place. Highest BP reading of 174/96 with HR 93 and lowest 127/77 with heart rate 73 Home cuff is around 72 years old Takes amlodipine 2.5 mg daily and Tolerates it well without any side effects  Denies SOB, palpitation, chest pain, headaches,or swelling   Plan:  Continue taking amlodipine 2.5 mg QAM Patient to buy new BP monitor and bring it in at the next OV for validation Patient to keep record of BP readings with heart rate and report to [place] at the next visit Patient to see PharmD in 4 weeks for follow up  Follow up lab(s): none     Thank you  Cammy Copa, Pharm.D [facility] A Division of [facility] [address] [place], [place]  [address]  Phone: [phone]; Fax: [phone]

## 2023-01-01 NOTE — Assessment & Plan Note (Signed)
Assessment: BP is controlled in office BP 115/65 mmHg heart rate 69 (goal <130/80) home BP is all over the place. Highest BP reading of 174/96 with HR 93 and lowest 127/77 with heart rate 73 Home cuff is around 72 years old Takes amlodipine 2.5 mg daily and Tolerates it well without any side effects  Denies SOB, palpitation, chest pain, headaches,or swelling   Plan:  Continue taking amlodipine 2.5 mg QAM Patient to buy new BP monitor and bring it in at the next OV for validation Patient to keep record of BP readings with heart rate and report to [place] at the next visit Patient to see PharmD in 4 weeks for follow up  Follow up lab(s): none

## 2023-01-01 NOTE — Patient Instructions (Addendum)
No changes made by your pharmacist Teigen Bellin, PharmD at today's visit:    Bring your BP cuff and your record of home blood pressures to your next appointment.    HOW TO TAKE YOUR BLOOD PRESSURE AT HOME  Rest 5 minutes before taking your blood pressure.  Don't smoke or drink caffeinated beverages for at least 30 minutes before. Take your blood pressure before (not after) you eat. Sit comfortably with your back supported and both feet on the floor (don't cross your legs). Elevate your arm to heart level on a table or a desk. Use the proper sized cuff. It should fit smoothly and snugly around your bare upper arm. There should be enough room to slip a fingertip under the cuff. The bottom edge of the cuff should be 1 inch above the crease of the elbow. Ideally, take 3 measurements at one sitting and record the average.  Important lifestyle changes to control high blood pressure  Intervention  Effect on the BP  Lose extra pounds and watch your waistline Weight loss is one of the most effective lifestyle changes for controlling blood pressure. If you're overweight or obese, losing even a small amount of weight can help reduce blood pressure. Blood pressure might go down by about 1 millimeter of mercury (mm Hg) with each kilogram (about 2.2 pounds) of weight lost.  Exercise regularly As a general goal, aim for at least 30 minutes of moderate physical activity every day. Regular physical activity can lower high blood pressure by about 5 to 8 mm Hg.  Eat a healthy diet Eating a diet rich in whole grains, fruits, vegetables, and low-fat dairy products and low in saturated fat and cholesterol. A healthy diet can lower high blood pressure by up to 11 mm Hg.  Reduce salt (sodium) in your diet Even a small reduction of sodium in the diet can improve heart health and reduce high blood pressure by about 5 to 6 mm Hg.  Limit alcohol One drink equals 12 ounces of beer, 5 ounces of wine, or 1.5 ounces of  80-proof liquor.  Limiting alcohol to less than one drink a day for women or two drinks a day for men can help lower blood pressure by about 4 mm Hg.   If you have any questions or concerns please use My Chart to send questions or call the office at [phone]  

## 2023-01-08 ENCOUNTER — Other Ambulatory Visit (HOSPITAL_BASED_OUTPATIENT_CLINIC_OR_DEPARTMENT_OTHER): Payer: Self-pay

## 2023-01-09 ENCOUNTER — Other Ambulatory Visit (HOSPITAL_BASED_OUTPATIENT_CLINIC_OR_DEPARTMENT_OTHER): Payer: Self-pay

## 2023-01-15 ENCOUNTER — Other Ambulatory Visit (HOSPITAL_BASED_OUTPATIENT_CLINIC_OR_DEPARTMENT_OTHER): Payer: Self-pay

## 2023-01-15 MED ORDER — LEVOTHYROXINE SODIUM 88 MCG PO TABS
88.0000 ug | ORAL_TABLET | Freq: Every day | ORAL | 0 refills | Status: DC
  Filled 2023-01-15: qty 30, 30d supply, fill #0

## 2023-01-16 ENCOUNTER — Other Ambulatory Visit (HOSPITAL_BASED_OUTPATIENT_CLINIC_OR_DEPARTMENT_OTHER): Payer: Self-pay

## 2023-01-18 ENCOUNTER — Other Ambulatory Visit (HOSPITAL_BASED_OUTPATIENT_CLINIC_OR_DEPARTMENT_OTHER): Payer: Self-pay

## 2023-01-22 ENCOUNTER — Other Ambulatory Visit (HOSPITAL_BASED_OUTPATIENT_CLINIC_OR_DEPARTMENT_OTHER): Payer: Self-pay

## 2023-01-22 MED ORDER — LEVOTHYROXINE SODIUM 100 MCG PO TABS
100.0000 ug | ORAL_TABLET | Freq: Every morning | ORAL | 0 refills | Status: DC
  Filled 2023-01-22: qty 90, 90d supply, fill #0

## 2023-01-24 ENCOUNTER — Telehealth: Payer: Self-pay | Admitting: Internal Medicine

## 2023-01-24 NOTE — Telephone Encounter (Signed)
[place] [phone]  Fannie Knee called to say Daryel is having spells of Light headed, unsteadied on feet, numbness on right side of face. This comes and goes and has been going on for a couple of months, he would like to come in and see you about this to see what might be going on.

## 2023-01-24 NOTE — Telephone Encounter (Signed)
scheduled

## 2023-01-31 ENCOUNTER — Ambulatory Visit (INDEPENDENT_AMBULATORY_CARE_PROVIDER_SITE_OTHER): Payer: Medicare Other | Admitting: Internal Medicine

## 2023-01-31 VITALS — Temp 97.9°F | Ht 66.0 in | Wt 151.8 lb

## 2023-01-31 DIAGNOSIS — R531 Weakness: Secondary | ICD-10-CM

## 2023-01-31 DIAGNOSIS — Z8673 Personal history of transient ischemic attack (TIA), and cerebral infarction without residual deficits: Secondary | ICD-10-CM | POA: Diagnosis not present

## 2023-01-31 DIAGNOSIS — R55 Syncope and collapse: Secondary | ICD-10-CM

## 2023-01-31 DIAGNOSIS — F419 Anxiety disorder, unspecified: Secondary | ICD-10-CM

## 2023-01-31 DIAGNOSIS — F32A Depression, unspecified: Secondary | ICD-10-CM

## 2023-01-31 DIAGNOSIS — E039 Hypothyroidism, unspecified: Secondary | ICD-10-CM | POA: Diagnosis not present

## 2023-01-31 DIAGNOSIS — Z7901 Long term (current) use of anticoagulants: Secondary | ICD-10-CM

## 2023-01-31 DIAGNOSIS — K224 Dyskinesia of esophagus: Secondary | ICD-10-CM

## 2023-01-31 DIAGNOSIS — Z951 Presence of aortocoronary bypass graft: Secondary | ICD-10-CM | POA: Diagnosis not present

## 2023-01-31 DIAGNOSIS — Z8546 Personal history of malignant neoplasm of prostate: Secondary | ICD-10-CM

## 2023-01-31 DIAGNOSIS — R5381 Other malaise: Secondary | ICD-10-CM

## 2023-01-31 DIAGNOSIS — C131 Malignant neoplasm of aryepiglottic fold, hypopharyngeal aspect: Secondary | ICD-10-CM

## 2023-01-31 DIAGNOSIS — E611 Iron deficiency: Secondary | ICD-10-CM

## 2023-01-31 NOTE — Progress Notes (Signed)
Patient Care Team: Margaree Mackintosh, MD as PCP - General (Internal Medicine) [name], MD as PCP - Cardiology (Cardiology) [name], MD as Consulting Physician (General Surgery) Janalyn Harder, MD (Inactive) as Consulting Physician (Dermatology)  Visit Date: 01/31/23  Subjective:    Patient ID: [name] , Male   DOB: Jan 23, 1951, 72 y.o.    MRN: [id number]   72 y.o. Male presents today for intermittent lightheadedness, balance issues, weakness, right-sided facial numbness. He has had multiple near syncopal episodes. Blood pressures measured at home are as follows: 95-184 systolic, 53-104 diastolic. History of arthritis, coronary artery disease, angioplasty with stent 2017, PVC, squamous cell cancer of tongue 2002, esophageal stenosis, transient ischemic attack. Taking ferous sulfate 325 mg twice daily after a meal. Status post transnasal endoscopy, balloon dilation 11/14/22.   He is followed at [facility] for history of prostate cancer. He had radical assisted robotic prostatectomy November 2021. He completed radiation treatments. His PSA was in the 3-4 range but increased to 6.6 in March 2020. Subsequently PSA increased to 13.25 in October 2021 and a biopsy done confirmed adenocarcinoma of the prostate with Gleason score of 7.   History of right carotid endarterectomy. Followed by Dr. Gery Pray, cardiologist. Dr. Gery Pray is aware of his recent dysrhythmia. His watch was showing intermittent bigeminy during today's appointment. He has a history of coronary artery disease. History of hyperlipidemia but is statin intolerant. History of low pressure differential in each arm due to subclavian stenosis. History of hypertension.   Underwent direct laryngoscopy, rigid cervical esophagoscopy, total laryngectomy, partial pharyngectomy, cervical esophagectomy, left thyroid lobectomy on 07/20/22. Seen by otolaryngologist on 01/22/23 and was having dysphagia with solids at  the time.   History of issues with swallowing in May 2019 and underwent colonoscopy and upper endoscopy. He had 3 small sessile polyps on colonoscopy. He was having some dysphagia and had history of GE reflux. He had normal stomach and normal duodenum on endoscopy. There were esophageal mucosal changes but no evidence of cancer or eosinophilic esophagitis. Biopsies were consistent with esophagitis and polyps were hyperplastic.   He has seen Dr. Sherri Rad, pulmonologist, in December 2020 for shortness of breath and was treated with an inhaler.  He underwent L2-L3 discectomy September 2020 by Dr. Shon Baton for severe intractable low back pain.   History of white blood cell count in the 3700 range which has been followed for several years and is asymptomatic.  History of neck pain previously seen by Dr. Venetia Maxon.  Had numbness of left face but not neck with intermittent paresthesias down left arm.  He had MRI in late 2021 of C-spine showing significant facet arthropathy on the left at C2-C3 and C3-C4.  There was a fluid cyst in the left C3-C4 facet joint and mild degenerative changes on the right at C6-C7.  Dr. Venetia Maxon believed this to be the basis for his left-sided occipital neck and jaw pain.  He recommended facet blocks at C2-C3 and C3-C4 levels on the left.   History of depression, hypothyroidism, CABG x5 in 2009.  Remote history of smoking, history of left Horner syndrome, history of stroke, cervical disc disease, external carotid artery stenosis on the left, history of migraine headaches, allergic rhinitis, attention deficit disorder and history of back pain.  In December 2017 he had PCI and drug-eluting stent placement of a high-grade circumflex and first obtuse marginal branch stenosis in the setting of an occluded sequential vein graft with patent continuation of  the OM1-OM 2   History of 70% right subclavian artery stenosis.  Percutaneous intervention was considered but has not been done.   He has a 50  mm upper extremity blood pressure differential due to right subclavian and/or innominate stenosis.  He is on chronic anticoagulation with Plavix.   He takes Zoloft for depression and lorazepam for anxiety per Dr. Evelene Croon.   History of left shoulder arthroscopy with subacromial decompression and open rotator cuff repair by Dr. Devonne Doughty in 2014.  Open distal clavicle resection and open biceps tendon diathesis.   Intolerant of statins.  Dr. Gery Pray has treated him with Praluent injections every 14 days.   History of GE reflux treated with Protonix.   In January 2018 he was seen in the emergency department with palpitations, diaphoresis and lightheadedness.  D-dimer was elevated at 0.75.  CT angio of the chest with contrast showed no evidence of PE.  He was thought not to have had an MI and was discharged home.  CT angios of chest and aorta with and without contrast showed 70% proximal right subclavian artery stenosis.  History of mild plaque in left subclavian artery.  History of mild plaque in distal right subclavian artery.   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  He had discectomy of the C-spine around 2000.   He developed left eye ptosis after cardiac surgery.  Says his face feels numb when he turns his head to the left.   Had left thalamic stroke in 2005.  History of numbness of the left cheek in the C3 distribution.  Documented facet disease on the left at C2-C3, C4-C5, C5-C6 as well as C6-C7 which is aggravated by turning his head.   History of GE reflux.   Says he has remote history of viral hepatitis a number of years ago.   Patient says his neurologist, Dr. Sandria Manly, who is now retired felt his carotid disease was due to radiation therapy.   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  He smoked for some 30 years but quit over 20 years ago.  1 son from previous marriage.  This is his second marriage.  He does drink beer sometimes.   Family history: Mother died of ovarian cancer.  Father died apparently of heart failure but also had history of stroke around age 23 and diabetes.  Maternal grandmother, paternal grandmother with history of cancer.  He has 2 brothers.   Regarding his depression, he has previously been on Wellbutrin and Pristiq in the past per Dr. Evelene Croon.  Past Medical History:  Diagnosis Date   ADHD (attention deficit hyperactivity disorder)    Anxiety    Arthritis    "NECK, HANDS, KNEES" (06/14/2016)   Atypical mole 08/07/2021   Mid Back (Atypical Proliferation)   CAD (coronary artery disease)    Carotid artery stenosis    Complication of anesthesia    Reports that he had trouble swallowing post anesthesia- CABG   COPD (chronic obstructive pulmonary disease) (HCC) 12/11/2011   r/s mv - EF 72%; exercise capacity 13 METS; no exercised induced ischemic EKG changes   Depression, major    Dyspnea    with exertion   GERD (gastroesophageal reflux disease)  HTN (hypertension), benign 04/16/2008   echo - EF >55%; no regional wall or valvular abnormalities   Hyperlipidemia    statin intolerant   Hypothyroidism    Migraine    "crippling headaches as a kid; silent migraines now, no pain, couple times/week" (06/14/2016)   Peripheral vascular disease (HCC)    Prostate cancer ([facility]) 04/2019   recent dx - Dr [name]   S/P angioplasty with stent 06/14/16 PCI & DES of high grade AV groove LCX and 1st OM  06/15/2016   Squamous cell cancer of tongue ([facility]) ~ 2002   "35 radiation treatments at [facility]"   Subclavian steal syndrome 06/17/2012   carotid doppler - R systolic brachial pressure , L ; R subclavian artery - proxmial obsstruction w/ abnormal monophasic waveforms, R ECA known occlusive  disease; L ECA narrowing w/ 70-99% diameter reductiona   TIA (transient ischemic attack) 2005   Viral hepatitis 1970s   "non specific"     Family History  Problem Relation Age of Onset   Cancer Mother        ovarian   Stroke Father    Diabetes Father    Arthritis Father    Hypertension Brother     Social History   Social History Narrative   Not on file      Review of Systems  Constitutional:  Positive for malaise/fatigue. Negative for fever.  HENT:  Negative for congestion.   Eyes:  Negative for blurred vision.  Respiratory:  Negative for cough and shortness of breath.   Cardiovascular:  Negative for chest pain, palpitations and leg swelling.  Gastrointestinal:  Negative for vomiting.  Musculoskeletal:  Negative for back pain.  Skin:  Negative for rash.  Neurological:  Positive for focal weakness (Right) and weakness. Negative for loss of consciousness and headaches.       (+) Lightheadedness        Objective:   Vitals: Temp 97.9 F (36.6 C) (Tympanic)   Ht 5\' 6"  (1.676 m)   Wt 151 lb 12.8 oz (68.9 kg)   SpO2 98%   BMI 24.50 kg/m   Skin is warm and dry.  No cervical adenopathy appreciated.  Chest clear.  Cardiac exam: Irregular rhythm with frequent extrasystoles but he is not in atrial fibrillation.  No lower extremity pitting edema.  He is alert and cooperative.  He is thin.     Results:   Studies obtained and personally reviewed by me:   Labs:       Component Value Date/Time   NA 144 04/27/2022 0945   K 3.9 04/27/2022 0945   CL 104 04/27/2022 0945   CO2 30 04/27/2022 0945   GLUCOSE 102 (H) 04/27/2022 0945   BUN 14 04/27/2022 0945   CREATININE 1.21 04/27/2022 0945   CALCIUM 9.2 04/27/2022 0945   PROT 6.6 04/27/2022 0945   ALBUMIN 3.6 05/05/2021 0852   AST 19 04/27/2022 0945   ALT 18 04/27/2022 0945   ALKPHOS 57 05/05/2021 0852   BILITOT 0.4 04/27/2022 0945   GFRNONAA 59 (L) 05/06/2021 0432   GFRNONAA 69 03/30/2021 0950   GFRAA 80  03/30/2021 0950     Lab Results  Component Value Date   WBC 3.6 (L) 04/27/2022   HGB 16.0 04/27/2022   HCT 46.7 04/27/2022   MCV 90.9 04/27/2022   PLT 146 04/27/2022    Lab Results  Component Value Date   CHOL 149 04/27/2022   HDL 82 04/27/2022   LDLCALC 47 04/27/2022  TRIG 115 04/27/2022   CHOLHDL 1.8 04/27/2022    Lab Results  Component Value Date   HGBA1C 5.5 01/01/2017     Lab Results  Component Value Date   TSH 1.16 05/31/2022     Lab Results  Component Value Date   PSA <0.04 04/27/2022   PSA <0.04 03/30/2021   PSA 12.6 (H) 03/18/2020      Assessment & Plan:   Lightheadedness, facial weakness, near syncopal episodes: ordered CBC with Diff/Plat, iron/TIBC/ferritin, B12 with folate, CMET with GFR, magnesium, T4, TSH. Will contact him with results and discuss follow-up. Stay well-hydrated.  Iron deficiency: Labs have been reviewed and he is mildly iron deficient with serum iron being 41.  Recommend iron supplement over-the-counter.  B12 and folate levels are normal.  He is mildly anemic with hemoglobin 11.4 g.  MCV is 74.7.  He had colonoscopy in January 2024.  History of hypothyroidism.  TSH is stable on thyroid replacement medication at the present time.  TSH and T4 are now normal.  3 weeks ago TSH was 6.741.  Needs to take this on an empty stomach without food.  Currently on 100 mcg daily of levothyroxine.  History of attention deficit disorder treated with Adderall per Dr. 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  They will likely do labs at that point.  If not we can follow-up with him around that time.   I,Alexander Ruley,acting as a Neurosurgeon for Margaree Mackintosh, MD.,have documented all relevant documentation on the behalf of Margaree Mackintosh, MD,as directed by  Margaree Mackintosh, MD while in the presence of Margaree Mackintosh, MD.   I, Margaree Mackintosh, MD, have reviewed all documentation for this visit. The documentation on 02/13/23 for the exam, diagnosis, procedures, and orders are all accurate and complete.

## 2023-02-01 LAB — COMPLETE METABOLIC PANEL WITH GFR
AG Ratio: 2 (calc) (ref 1.0–2.5)
ALT: 16 U/L (ref 9–46)
AST: 17 U/L (ref 10–35)
Albumin: 4.5 g/dL (ref 3.6–5.1)
Alkaline phosphatase (APISO): 70 U/L (ref 35–144)
BUN/Creatinine Ratio: 23 (calc) — ABNORMAL HIGH (ref 6–22)
BUN: 29 mg/dL — ABNORMAL HIGH (ref 7–25)
CO2: 26 mmol/L (ref 20–32)
Calcium: 9.4 mg/dL (ref 8.6–10.3)
Chloride: 105 mmol/L (ref 98–110)
Creat: 1.28 mg/dL (ref 0.70–1.28)
Globulin: 2.3 g/dL (calc) (ref 1.9–3.7)
Glucose, Bld: 92 mg/dL (ref 65–99)
Potassium: 4.6 mmol/L (ref 3.5–5.3)
Sodium: 141 mmol/L (ref 135–146)
Total Bilirubin: 0.3 mg/dL (ref 0.2–1.2)
Total Protein: 6.8 g/dL (ref 6.1–8.1)
eGFR: 60 mL/min/{1.73_m2} (ref >=60)

## 2023-02-01 LAB — IRON,TIBC AND FERRITIN PANEL
%SAT: 9 % (calc) — ABNORMAL LOW (ref 20–48)
Ferritin: 9 ng/mL — ABNORMAL LOW (ref 24–380)
Iron: 41 ug/dL — ABNORMAL LOW (ref 50–180)
TIBC: 464 mcg/dL (calc) — ABNORMAL HIGH (ref 250–425)

## 2023-02-01 LAB — CBC WITH DIFFERENTIAL/PLATELET
Absolute Monocytes: 707 cells/uL (ref 200–950)
Basophils Absolute: 40 cells/uL (ref 0–200)
Basophils Relative: 0.7 %
Eosinophils Absolute: 251 cells/uL (ref 15–500)
Eosinophils Relative: 4.4 %
HCT: 36.9 % — ABNORMAL LOW (ref 38.5–50.0)
Hemoglobin: 11.4 g/dL — ABNORMAL LOW (ref 13.2–17.1)
Lymphs Abs: 536 cells/uL — ABNORMAL LOW (ref 850–3900)
MCH: 23.1 pg — ABNORMAL LOW (ref 27.0–33.0)
MCHC: 30.9 g/dL — ABNORMAL LOW (ref 32.0–36.0)
MCV: 74.7 fL — ABNORMAL LOW (ref 80.0–100.0)
MPV: 9.6 fL (ref 7.5–12.5)
Monocytes Relative: 12.4 %
Neutro Abs: 4167 cells/uL (ref 1500–7800)
Neutrophils Relative %: 73.1 %
Platelets: 228 10*3/uL (ref 140–400)
RBC: 4.94 10*6/uL (ref 4.20–5.80)
RDW: 17.4 % — ABNORMAL HIGH (ref 11.0–15.0)
Total Lymphocyte: 9.4 %
WBC: 5.7 10*3/uL (ref 3.8–10.8)

## 2023-02-01 LAB — TSH: TSH: 2.4 mIU/L (ref 0.40–4.50)

## 2023-02-01 LAB — T4, FREE: Free T4: 1.1 ng/dL (ref 0.8–1.8)

## 2023-02-01 LAB — MAGNESIUM: Magnesium: 2.5 mg/dL (ref 1.5–2.5)

## 2023-02-01 LAB — B12 AND FOLATE PANEL
Folate: 18.2 ng/mL
Vitamin B-12: 396 pg/mL (ref 200–1100)

## 2023-02-03 ENCOUNTER — Other Ambulatory Visit (HOSPITAL_BASED_OUTPATIENT_CLINIC_OR_DEPARTMENT_OTHER): Payer: Self-pay

## 2023-02-04 ENCOUNTER — Other Ambulatory Visit (HOSPITAL_BASED_OUTPATIENT_CLINIC_OR_DEPARTMENT_OTHER): Payer: Self-pay

## 2023-02-04 ENCOUNTER — Other Ambulatory Visit: Payer: Self-pay

## 2023-02-04 MED ORDER — LORAZEPAM 1 MG PO TABS
1.0000 mg | ORAL_TABLET | Freq: Three times a day (TID) | ORAL | 0 refills | Status: DC
  Filled 2023-02-04: qty 225, 90d supply, fill #0

## 2023-02-07 ENCOUNTER — Ambulatory Visit: Payer: Medicare Other | Attending: Cardiology | Admitting: Student

## 2023-02-07 VITALS — BP 117/71 | HR 64

## 2023-02-07 DIAGNOSIS — I10 Essential (primary) hypertension: Secondary | ICD-10-CM | POA: Diagnosis present

## 2023-02-07 NOTE — Assessment & Plan Note (Signed)
Assessment: BP is controlled in office BP 117/71 mmHg (goal <130/80) His home BP is all over the place with low 110/60 and high 182/92; HR stays low sometimes it gets to 35  Tolerates amlodipine well without any side effects  Denies SOB, palpitation, chest pain, headaches,or swelling Takes Adderall PRN - gets neck tightness from Adderall Suspecting elevated SBP from Adderall asked patient to keep log    Plan:  Continue taking amlodipine 2.5 mg daily Patient to keep record of BP readings with heart rate and report to [place] at the next visit mark the days when you take Adderall  Patient to see PharmD in 6 weeks for follow up  Follow up lab(s): none

## 2023-02-07 NOTE — Progress Notes (Signed)
Patient ID: [name]                 DOB: March 04, 1951                      MRN: [id number]      HPI: [name] is a 72 y.o. male referred by Dr. Allyson Sabal to HTN clinic. PMH is significant for CAD status post coronary artery bypass grafting x 4 by Dr. Dorris Fetch 06/16/2008 , carotid artery stenosis, HDL,total laryngectomy and partial thyroidectomy.   Patient saw Dr.Berry on 11/28/2022, reported he had stopped taking all his BP medications including hydrochlorothiazide, amlodipine and metoprolol because of orthostatic hypotension. Home BP ~130-150 range so amlodipine 2.5 mg was started.   Patient brought in home BP for validation today. Found out to be accurate. Reports hid HR sometimes goes to 35. His smart watch reports that. He denies any palpitation, SOB or swelling. He gets dizzy sometime after driving long distance when he gets out of the car he gets dizzy. His home BP is all over the place with low 110/60 and high 182/92. His diet is same and has been active around the house.   147/76 heart rate 74 - 1st on home Monitor  140/62 HR 35 - 2nd on home monitor  109/69 heart rate 68 on office cuff  112/56 heart rate 35 on home BP monitor  117/71 heart rate 64 on office monitor  122/56 heart rate 35 on home monitor    Current HTN meds: amlodipine 2.5 mg daily  Previously tried: hydrochlorothiazide, amlodipine and metoprolol  BP goal: <130/80   Diet: on liquid diet   Exercise: busy around the house with his woodwork     Wt Readings from Last 3 Encounters:  01/31/23 151 lb 12.8 oz (68.9 kg)  11/28/22 149 lb 12.8 oz (67.9 kg)  04/30/22 147 lb 12 oz (67 kg)   BP Readings from Last 3 Encounters:  02/07/23 117/71  01/01/23 115/65  11/28/22 (!) 98/52   Pulse Readings from Last 3 Encounters:  02/07/23 64  01/01/23 69  11/28/22 (!) 56    Renal function: Estimated Creatinine Clearance: 47.8 mL/min (by C-G formula based on SCr of 1.28 mg/dL).  Past Medical History:   Diagnosis Date   ADHD (attention deficit hyperactivity disorder)    Anxiety    Arthritis    "NECK, HANDS, KNEES" (06/14/2016)   Atypical mole 08/07/2021   Mid Back (Atypical Proliferation)   CAD (coronary artery disease)    Carotid artery stenosis    Complication of anesthesia    Reports that he had trouble swallowing post anesthesia- CABG   COPD (chronic obstructive pulmonary disease) (HCC) 12/11/2011   r/s mv - EF 72%; exercise capacity 13 METS; no exercised induced ischemic EKG changes   Depression, major    Dyspnea    with exertion   GERD (gastroesophageal reflux disease)    HTN (hypertension), benign 04/16/2008   echo - EF >55%; no regional wall or valvular abnormalities   Hyperlipidemia    statin intolerant   Hypothyroidism    Migraine    "crippling headaches as a kid; silent migraines now, no pain, couple times/week" (06/14/2016)   Peripheral vascular disease (HCC)    Prostate cancer (HCC) 04/2019   recent dx - Dr [name]   S/P angioplasty with stent 06/14/16 PCI & DES of high grade AV groove LCX and 1st OM  06/15/2016   Squamous cell cancer of tongue (HCC) ~  2002   "35 radiation treatments at [facility]"   Subclavian steal syndrome 06/17/2012   carotid doppler - R systolic brachial pressure , L ; R subclavian artery - proxmial obsstruction w/ abnormal monophasic waveforms, R ECA known occlusive disease; L ECA narrowing w/ 70-99% diameter reductiona   TIA (transient ischemic attack) 2005   Viral hepatitis 1970s   "non specific"    Current Outpatient Medications on File Prior to Visit  Medication Sig Dispense Refill   albuterol (VENTOLIN HFA) 108 (90 Base) MCG/ACT inhaler Inhale 2 puffs into the lungs every 6 (six) hours as needed for wheezing or shortness of breath. 18 g 5   amLODipine (NORVASC) 2.5 MG tablet Take 1 tablet (2.5 mg total) by mouth daily. 90 tablet 3   buPROPion (WELLBUTRIN XL) 150 MG 24 hr tablet Take 1 tablet (150 mg total) by mouth in  the morning. 90 tablet 4   Evolocumab (REPATHA SURECLICK) 140 MG/ML SOAJ Inject 140 mg into the skin every 14 (fourteen) days. 6 mL 3   ferrous sulfate 325 (65 FE) MG EC tablet Take 1 tablet (325 mg total) by mouth 2 (two) times daily after a meal. Can take the treatment every other day to minimize side-effects. 90 tablet 2   amphetamine-dextroamphetamine (ADDERALL) 20 MG tablet Take 1 tablet (20 mg total) by mouth 3 (three) times daily. 270 tablet 0   amphetamine-dextroamphetamine (ADDERALL) 20 MG tablet Take 1 tablet (20 mg total) by mouth 3 (three) times daily. 270 tablet 0   aspirin EC 81 MG tablet Take 81 mg by mouth daily. Swallow whole.     COVID-19 mRNA bivalent vaccine, Pfizer, (PFIZER COVID-19 VAC BIVALENT) injection Inject into the muscle. 0.3 mL 0   COVID-19 mRNA Vac-TriS, Pfizer, (COMIRNATY) SUSP injection Inject into the muscle. 0.3 mL 0   influenza vaccine adjuvanted (FLUAD QUADRIVALENT) 0.5 ML injection Inject into the muscle. 0.5 mL 0   influenza vaccine adjuvanted (FLUAD) 0.5 ML injection Inject into the muscle. 0.5 mL 0   levothyroxine (SYNTHROID) 100 MCG tablet Take 1 tablet (100 mcg total) by mouth in the morning. 90 tablet 0   LORazepam (ATIVAN) 1 MG tablet Take 1 tablet by mouth three times daily as needed 270 tablet 1   pantoprazole (PROTONIX) 40 MG tablet TAKE 1 TABLET (40 MG TOTAL) BY MOUTH DAILY. 90 tablet 3   sertraline (ZOLOFT) 100 MG tablet Take 1 tablet (100 mg total) by mouth daily. 90 tablet 4   [DISCONTINUED] tadalafil (CIALIS) 5 MG tablet Take 1 tablet (5 mg total) by mouth daily. Take 3-5 times a week for penile rehabilitation. May take up to 20mg  as needed for intercourse. 30 tablet 2   Current Facility-Administered Medications on File Prior to Visit  Medication Dose Route Frequency Provider Last Rate Last Admin   ticagrelor (BRILINTA) tablet 90 mg  90 mg Oral BID [name], MD        Allergies  Allergen Reactions   Crestor [Rosuvastatin Calcium]  Other (See Comments)    Difficulty swallowing   Tramadol Shortness Of Breath   Lisinopril Cough   Gabapentin Other (See Comments)    Hallucinations   Other    Tricor [Fenofibrate] Other (See Comments)    UNSPECIFIED REACTION   Lipitor [Atorvastatin Calcium] Other (See Comments)    Muscle spasms    Blood pressure 117/71, pulse 64, SpO2 100 %.   Hypertension Assessment: BP is controlled in office BP 117/71 mmHg (goal <130/80) His home BP is all  over the place with low 110/60 and high 182/92; HR stays low sometimes it gets to 35  Tolerates amlodipine well without any side effects  Denies SOB, palpitation, chest pain, headaches,or swelling Takes Adderall PRN - gets neck tightness from Adderall Suspecting elevated SBP from Adderall asked patient to keep log    Plan:  Continue taking amlodipine 2.5 mg daily Patient to keep record of BP readings with heart rate and report to [place] at the next visit mark the days when you take Adderall  Patient to see PharmD in 6 weeks for follow up  Follow up lab(s): none   Thank you  Carmela Hurt, Pharm.D [facility] A Division of [facility] [address], [place], [place] [address]  Phone: [phone]; Fax: [phone]

## 2023-02-13 ENCOUNTER — Encounter: Payer: Self-pay | Admitting: Internal Medicine

## 2023-02-13 NOTE — Patient Instructions (Signed)
Take iron supplement over-the-counter.  Keep cardiology appointment in late May and have lab work done at that time to follow-up on iron deficiency anemia.  May benefit from exercise program such as cardiac rehab for deconditioning.  May discuss that with cardiologist.  Continue current medications and take iron supplement.

## 2023-02-21 ENCOUNTER — Other Ambulatory Visit (HOSPITAL_BASED_OUTPATIENT_CLINIC_OR_DEPARTMENT_OTHER): Payer: Self-pay

## 2023-02-21 ENCOUNTER — Other Ambulatory Visit: Payer: Self-pay

## 2023-02-21 MED ORDER — AMPHETAMINE-DEXTROAMPHETAMINE 20 MG PO TABS
20.0000 mg | ORAL_TABLET | Freq: Three times a day (TID) | ORAL | 0 refills | Status: DC
  Filled 2023-02-21: qty 49, 17d supply, fill #0
  Filled 2023-02-21: qty 221, 73d supply, fill #0

## 2023-02-27 ENCOUNTER — Ambulatory Visit: Payer: Medicare Other | Attending: Nurse Practitioner | Admitting: Nurse Practitioner

## 2023-02-27 ENCOUNTER — Other Ambulatory Visit (HOSPITAL_BASED_OUTPATIENT_CLINIC_OR_DEPARTMENT_OTHER): Payer: Self-pay

## 2023-02-27 ENCOUNTER — Encounter: Payer: Self-pay | Admitting: Nurse Practitioner

## 2023-02-27 VITALS — BP 120/64 | HR 71 | Ht 66.0 in | Wt 151.0 lb

## 2023-02-27 DIAGNOSIS — I498 Other specified cardiac arrhythmias: Secondary | ICD-10-CM | POA: Diagnosis present

## 2023-02-27 DIAGNOSIS — I10 Essential (primary) hypertension: Secondary | ICD-10-CM | POA: Insufficient documentation

## 2023-02-27 DIAGNOSIS — E785 Hyperlipidemia, unspecified: Secondary | ICD-10-CM | POA: Insufficient documentation

## 2023-02-27 DIAGNOSIS — I6523 Occlusion and stenosis of bilateral carotid arteries: Secondary | ICD-10-CM | POA: Insufficient documentation

## 2023-02-27 DIAGNOSIS — I2510 Atherosclerotic heart disease of native coronary artery without angina pectoris: Secondary | ICD-10-CM | POA: Diagnosis present

## 2023-02-27 MED ORDER — METOPROLOL TARTRATE 25 MG PO TABS
12.5000 mg | ORAL_TABLET | Freq: Two times a day (BID) | ORAL | 3 refills | Status: DC
  Filled 2023-02-27: qty 90, 90d supply, fill #0

## 2023-02-27 NOTE — Progress Notes (Signed)
Office Visit    Patient Name: [name] Date of Encounter: 02/27/2023  Primary Care Provider:  Margaree Mackintosh, MD Primary Cardiologist:  Nanetta Batty, MD  Chief Complaint    72 year old male with a history of CAD s/p CABG x 5 (LIMA-LAD, SVG-D1, SVG-OM/OM2, SVG-PDA) in 2009, DES-LCx in 2017,frequent PVCs, hypertension, hyperlipidemia with statin intolerance, carotid artery disease, TIA, laryngectomy, thyroidectomy, and prostate cancer who presents for follow-up related to CAD and hypertension.  Past Medical History    Past Medical History:  Diagnosis Date   ADHD (attention deficit hyperactivity disorder)    Anxiety    Arthritis    "NECK, HANDS, KNEES" (06/14/2016)   Atypical mole 08/07/2021   Mid Back (Atypical Proliferation)   CAD (coronary artery disease)    Carotid artery stenosis    Complication of anesthesia    Reports that he had trouble swallowing post anesthesia- CABG   COPD (chronic obstructive pulmonary disease) (HCC) 12/11/2011   r/s mv - EF 72%; exercise capacity 13 METS; no exercised induced ischemic EKG changes   Depression, major    Dyspnea    with exertion   GERD (gastroesophageal reflux disease)    HTN (hypertension), benign 04/16/2008   echo - EF >55%; no regional wall or valvular abnormalities   Hyperlipidemia    statin intolerant   Hypothyroidism    Migraine    "crippling headaches as a kid; silent migraines now, no pain, couple times/week" (06/14/2016)   Peripheral vascular disease (HCC)    Prostate cancer (HCC) 04/2019   recent dx - Dr [name]   S/P angioplasty with stent 06/14/16 PCI & DES of high grade AV groove LCX and 1st OM  06/15/2016   Squamous cell cancer of tongue (HCC) ~ 2002   "35 radiation treatments at [facility]"   Subclavian steal syndrome 06/17/2012   carotid doppler - R systolic brachial pressure , L ; R subclavian artery - proxmial obsstruction w/ abnormal monophasic waveforms, R ECA known occlusive disease;  L ECA narrowing w/ 70-99% diameter reductiona   TIA (transient ischemic attack) 2005   Viral hepatitis 1970s   "non specific"   Past Surgical History:  Procedure Laterality Date   BACK SURGERY     BIOPSY  02/20/2018   Procedure: BIOPSY;  Surgeon: [name], MD;  Location: [facility];  Service: Endoscopy;;   BIOPSY TONGUE  ~ 2002   "base of tongue and uvula"   CARDIAC CATHETERIZATION  04/26/2008   L circumflex 99% stenosed midportion straddling a marginal branch; RCA total priximally w/ grade 2 L to R collaterals; renal arteries widely patent; LAD 30% segmental proximal stenosis   CARDIAC CATHETERIZATION N/A 05/14/2016   Procedure: Left Heart Cath and Cors/Grafts Angiography;  Surgeon: [name], MD;  Location: [facility];  Service: Cardiovascular;  Laterality: N/A;   CARDIAC CATHETERIZATION N/A 06/14/2016   Procedure: Coronary Stent Intervention;  Surgeon: [name], MD;  Location: [facility];  Service: Cardiovascular;  Laterality: N/A;   CERVICAL DISC SURGERY  2000   [facility]   COLONOSCOPY WITH PROPOFOL N/A 02/20/2018   Procedure: COLONOSCOPY WITH PROPOFOL;  Surgeon: [name], MD;  Location: [facility];  Service: Endoscopy;  Laterality: N/A;   CORONARY ANGIOPLASTY WITH STENT PLACEMENT     CORONARY ARTERY BYPASS GRAFT  2009   x5; LIMA to LAd, sequential vein to first diagonal branch, first and second obtuse marginal branches; vein to PDA   ESOPHAGOGASTRODUODENOSCOPY (EGD) WITH PROPOFOL N/A  02/20/2018   Procedure: ESOPHAGOGASTRODUODENOSCOPY (EGD) WITH PROPOFOL;  Surgeon: [name], MD;  Location: [facility];  Service: Endoscopy;  Laterality: N/A;   INSERTION OF MESH N/A 10/29/2017   Procedure: INSERTION OF MESH;  Surgeon: [name], MD;  Location: [facility] OR;  Service: General;  Laterality: N/A;   LUMBAR DISC SURGERY  06/17/2019   LUMBAR 2 & 3    LUMBAR LAMINECTOMY/DECOMPRESSION MICRODISCECTOMY N/A 06/17/2019   Procedure: Lumbar two -Lumbar three  decompression and disectomy;  Surgeon: [name], MD;  Location: [facility];  Service: Orthopedics;  Laterality: N/A;  3 hrs   POLYPECTOMY  02/20/2018   Procedure: POLYPECTOMY;  Surgeon: [name], MD;  Location: [facility];  Service: Endoscopy;;   SHOULDER ARTHROSCOPY WITH SUBACROMIAL DECOMPRESSION AND OPEN ROTATOR C Left 09/28/2013   Procedure: LEFT SHOULDER ARTHROSCOPY WITH SUBACROMIAL DECOMPRESSION AND MINI OPEN ROTATOR CUFF REPAIR, OPEN DISTAL CLAVICLE RESECTION AND OPEN BICEP TENDODESIS ;  Surgeon: [name], MD;  Location: [facility] OR;  Service: Orthopedics;  Laterality: Left;   TRANSCAROTID ARTERY REVASCULARIZATION  Right 05/05/2021   Procedure: RIGHT TRANSCAROTID ARTERY REVASCULARIZATION;  Surgeon: [name], MD;  Location: [facility];  Service: Vascular;  Laterality: Right;   UMBILICAL HERNIA REPAIR N/A 10/29/2017   Procedure: UMBILICAL HERNIA REPAIR;  Surgeon: [name], MD;  Location: [facility];  Service: General;  Laterality: N/A;    Allergies  Allergies  Allergen Reactions   Crestor [Rosuvastatin Calcium] Other (See Comments)    Difficulty swallowing   Tramadol Shortness Of Breath   Lisinopril Cough   Gabapentin Other (See Comments)    Hallucinations   Other    Tricor [Fenofibrate] Other (See Comments)    UNSPECIFIED REACTION   Lipitor [Atorvastatin Calcium] Other (See Comments)    Muscle spasms     Labs/Other Studies Reviewed    The following studies were reviewed today:  Cardiac Studies & Procedures   CARDIAC CATHETERIZATION  CARDIAC CATHETERIZATION 06/14/2016  Narrative Images from the original result were not included.   Mid Cx to Dist Cx lesion, 100 %stenosed.  SVG.  Origin lesion, 100 %stenosed.  Prox Cx to Mid Cx lesion, 80 %stenosed.  Post intervention, there is a 0% residual stenosis.  A stent was successfully placed.  Ost 2nd Mrg to 2nd Mrg lesion, 95 %stenosed.  Post intervention, there is a 0% residual stenosis.  A stent was  successfully placed.  [name] is a 72 y.o. male   [id number] LOCATION:  FACILITY: [facility] PHYSICIAN: Nanetta Batty, M.D. July 24, 1951   DATE OF PROCEDURE:  06/14/2016  DATE OF DISCHARGE:     CARDIAC CATHETERIZATION [place]    History obtained from chart review.The patient is a very pleasant 72 year old, thin-appearing, married Caucasian male, father of 1, grandfather to 4 grandchildren who I last saw 03/14/16. He has a history of coronary artery disease status post bypass grafting x5 by Dr. Andrey Spearman June 16, 2008, with a free LIMA to his LAD, sequential vein to the first diagonal branch, first and second obtuse marginal branches, as well as a vein to the PDA. His other problems include hypertension and hyperlipidemia. He denies chest pain or shortness of breath. I did perform cerebral angiography on him May 05, 2008, because of a question of a high-grade internal carotid artery stenosis read by the radiologist. However, this turned out to be a left external carotid artery stenosis. He does have high-grade ostial left internal mammary artery stenosis which necessitated the use of a free LIMA. He is  statin intolerant. 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 A Myoview stress test performed December 11, 2011, was normal and carotid Dopplers did show a moderately severe right ICA stenosis which we have been following by duplex ultrasound. 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 Since I saw him 12 months ago he has noticed increasing dyspnea on exertion over the last several months, especially noticeable while mowing his lawn. I obtained a Myoview stress test on him based on this 03/29/16 that showed a large reversible lateral wall perfusion defect  consistent  with ischemia. He underwent outpatient cardiac catheterization by myself on 05/14/16 revealing severe native and graft vessel disease with normal LV function. Specifically, his RCA vein graft was occluded to an occluded dominant right and his circumflex vein graft which was sequential to 2 obtuse marginal branches was occluded at the aorta to patent continuation between OM1 and 2. He did have native circumflex disease. Dr. Dorris Fetch saw him during his brief hospitalization to discuss the possibility of redo coronary artery bypass grafting although the consensus was that he would be better served with attempted PCI and stenting of his native circumflex coronary artery   Operators-Dr. Thayer Ohm End, Dr. Nanetta Batty  Impression Successful PCI and drug-eluting stenting of high-grade AV groove circumflex and first obtuse marginal branch stenosis in the setting of an occluded sequential vein graft with a patent continuation from OM1- OM 2 . The patient thought the procedure well. He will continue dual antiplatelet therapy. He'll be hydrated overnight and discharged home in the morning. I will see him back in the office in 2-3 weeks for follow-up.  Nanetta Batty. MD, [facility] 06/14/2016 9:03 AM  Findings Coronary Findings Diagnostic  Dominance: Right  Left Circumflex  Second Obtuse Marginal Branch  Saphenous Graft To 3rd Mrg SVG.  Intervention  Prox Cx to Mid Cx lesion Angioplasty A stent was successfully placed. There is a 0% residual stenosis post intervention.  Ost 2nd Mrg to 2nd Mrg lesion Angioplasty A stent was successfully placed. There is a 0% residual stenosis post intervention.   CARDIAC CATHETERIZATION  CARDIAC CATHETERIZATION 05/14/2016  Narrative Images from the original result were not included.   Ost RCA to Prox RCA lesion, 100 %stenosed.  Ost 1st Diag to 1st Diag lesion, 100 %stenosed.  Mid Cx to Dist Cx lesion, 100 %stenosed.  Ost 2nd Mrg to 2nd Mrg lesion, 90  %stenosed.  Prox Cx to Mid Cx lesion, 70 %stenosed.  Origin to Prox Graft lesion, 100 %stenosed.  Origin to Dist Graft lesion, 100 %stenosed.  LIMA.  The left ventricular systolic function is normal.  LV end diastolic pressure is normal.  The left ventricular ejection fraction is 55-65% by visual estimate.  DART RAULERSON is a 72 y.o. male   [id number] LOCATION:  FACILITY: [facility] PHYSICIAN: Nanetta Batty, M.D. [birth year]   DATE OF PROCEDURE:  05/14/2016  DATE OF DISCHARGE:     CARDIAC CATHETERIZATION    History obtained from chart review.The patient is a very pleasant 72 year old, thin-appearing, married Caucasian male, father of 1, grandfather to 4 grandchildren who I last saw 03/14/16. He has a history of coronary artery disease status post bypass grafting x5 by Dr. Andrey Spearman June 16, 2008, with a free LIMA to his LAD, sequential vein  to the first diagonal branch, first and second obtuse marginal branches, as well as a vein to the PDA. His other problems include hypertension and hyperlipidemia. He denies chest pain or shortness of breath. I did perform cerebral angiography on him May 05, 2008, because of a question of a high-grade internal carotid artery stenosis read by the radiologist. However, this turned out to be a left external carotid artery stenosis. He does have high-grade ostial left internal mammary artery stenosis which necessitated the use of a free LIMA. He is statin intolerant. 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 A Myoview stress test performed December 11, 2011, was normal and carotid Dopplers did show a moderately severe right ICA stenosis which we have been following by duplex ultrasound. He is neurologically asymptomatic and in the event this requires revascularization, he would probably require carotid artery stenting given the fact that he has a "hostile neck" from prior  irradiation. Since I saw him 12 months ago he has noticed increasing dyspnea on exertion over the last several months, especially noticeable while mowing his lawn. I obtained a Myoview stress test on him based on this 03/29/16 that showed a large reversible lateral wall perfusion defect  consistent with ischemia. He presents today for outpatient diagnostic coronary angiography to define his anatomy.  Impression Mr. Sorby has an occluded Y graft to the diagonal branch, OM 2 and 3 with a patent sequential portion between the 2 marginal branches and high-grade AV groove circumflex and proximal marginal branch stenosis. His free LIMA is patent with LAD and his RCA is occluded as is the vein graft to the RCA with grade 3 left-to-right collaterals. He does have normal LV function. His Myoview showed lateral ischemia. He has effort angina. I suspect this occurred because of the occlusion of [place]  and Herbie [place] and have asked Dr. Dorris Fetch to review the films and talk to the patient. His symptoms are exertional and stable. I'm going to discharge him home as an outpatient and will follow him up and make final disposition based on Dr. Sunday Corn assessment.  Nanetta Batty. MD, [facility] 05/14/2016 2:23 PM  Findings Coronary Findings Diagnostic  Dominance: Right  Left Anterior Descending  First Diagonal Branch  Left Circumflex  Second Obtuse Marginal Branch  Right Coronary Artery  Right Posterior Descending Artery Collaterals RPDA filled by collaterals from Mid LAD.  Graft To Ost RPDA  Sequential Graft To 1st Mrg, 2nd Mrg  Sequential LIMA Graft To Lat 1st Diag, Dist LAD LIMA.  Intervention  No interventions have been documented.   STRESS TESTS  MYOCARDIAL PERFUSION IMAGING 03/29/2016  Narrative  Nuclear stress EF: 58%.  The left ventricular ejection fraction is normal (55-65%).  No T wave inversion was noted during stress.  There was no ST segment deviation noted during  stress.  Defect 1: There is a large defect of moderate severity.  This is an intermediate risk study.  Defect 2: There is a medium defect of moderate severity present in the basal anterolateral, mid inferolateral, mid anterolateral and apical lateral location.  Findings consistent with ischemia.  Moderate size and intensity reversible lateral wall perfusion defect suggestive of ischemia. There is also a large, moderate intensity fixed inferior wall defect with underlying bowel attenuation which is likely artifact. LVEF 58% with normal wall motion. This is an intermediate risk study.   ECHOCARDIOGRAM  ECHOCARDIOGRAM COMPLETE 01/22/2022  Narrative ECHOCARDIOGRAM REPORT  Patient Name:   KYAIRE RUDIGER Date of Exam: 01/22/2022 Medical Rec #:  [id number]       Height:       70.0 in Accession #:    [id number]      Weight:       149.4 lb Date of Birth:  January 24, 1951        BSA:          1.844 m Patient Age:    71 years        BP:           120/60 mmHg Patient Gender: M               HR:           58 bpm. Exam Location:  Outpatient  Procedure: 2D Echo, Color Doppler, Cardiac Doppler and Strain Analysis  Indications:    R06.9 DOE; R42 Lightheaded  History:        Patient has prior history of Echocardiogram examinations, most recent 03/19/2016. CAD, TIA, Signs/Symptoms:Dizziness/Lightheadedness; Risk Factors:Hypertension, Dyslipidemia and Former Smoker. Patient denies chest pain and leg edema. He does have some DOE occassionally. Patient had a 2 week episode of dizziness after standing and it has since subsided. History of squamous cell cancer of the tongue and prostate cancer, both treated without chemotherapy.  Sonographer:    Carlos American RVT, RDCS (AE), RDMS Referring Phys: [phone] Devoria Albe DICK   Sonographer Comments: Image acquisition challenging due to respiratory motion. IMPRESSIONS   1. Left ventricular ejection fraction, by estimation, is 60 to 65%. Left ventricular  ejection fraction by 3D volume is 61 %. The left ventricle has normal function. The left ventricle has no regional wall motion abnormalities. There is mild concentric left ventricular hypertrophy. Left ventricular diastolic parameters are consistent with Grade II diastolic dysfunction (pseudonormalization). 2. Right ventricular systolic function is normal. The right ventricular size is normal. There is normal pulmonary artery systolic pressure. The estimated right ventricular systolic pressure is 25.3 mmHg. 3. Left atrial size was moderately dilated. 4. The mitral valve is abnormal. Mild mitral valve regurgitation. Moderate mitral annular calcification. 5. The aortic valve has an indeterminant number of cusps. There is mild calcification of the aortic valve. There is mild thickening of the aortic valve. Aortic valve regurgitation is mild. 6. Aortic dilatation noted. There is borderline dilatation of the aortic root, measuring 36 mm. 7. The inferior vena cava is normal in size with greater than 50% respiratory variability, suggesting right atrial pressure of 3 mmHg.  Comparison(s): Compared to prior TTE in 2017, there is no significant change.  FINDINGS Left Ventricle: Left ventricular ejection fraction, by estimation, is 60 to 65%. Left ventricular ejection fraction by 3D volume is 61 %. The left ventricle has normal function. The left ventricle has no regional wall motion abnormalities. The left ventricular internal cavity size was normal in size. There is mild concentric left ventricular hypertrophy. Left ventricular diastolic parameters are consistent with Grade II diastolic dysfunction (pseudonormalization).  Right Ventricle: The right ventricular size is normal. No increase in right ventricular wall thickness. Right ventricular systolic function is normal. There is normal pulmonary artery systolic pressure. The tricuspid regurgitant velocity is 2.36 m/s, and with an assumed right atrial pressure  of 3 mmHg, the estimated right ventricular systolic pressure is 25.3 mmHg.  Left Atrium: Left atrial size was moderately dilated.  Right Atrium: Right atrial size was normal in size.  Pericardium: There is no evidence of pericardial effusion.  Mitral  Valve: The mitral valve is abnormal. There is mild thickening of the mitral valve leaflet(s). There is mild calcification of the mitral valve leaflet(s). Moderate mitral annular calcification. Mild mitral valve regurgitation.  Tricuspid Valve: The tricuspid valve is normal in structure. Tricuspid valve regurgitation is trivial.  Aortic Valve: The aortic valve has an indeterminant number of cusps. There is mild calcification of the aortic valve. There is mild thickening of the aortic valve. Aortic valve regurgitation is mild. Aortic regurgitation PHT measures 531 msec. Aortic valve sclerosis/calcification is present, without any evidence of aortic stenosis. Aortic valve mean gradient measures 5.0 mmHg. Aortic valve peak gradient measures 9.1 mmHg. Aortic valve area, by VTI measures 1.88 cm.  Pulmonic Valve: The pulmonic valve was normal in structure. Pulmonic valve regurgitation is trivial.  Aorta: Aortic dilatation noted. There is borderline dilatation of the aortic root, measuring 36 mm.  Venous: The inferior vena cava is normal in size with greater than 50% respiratory variability, suggesting right atrial pressure of 3 mmHg.  IAS/Shunts: The atrial septum is grossly normal.   LEFT VENTRICLE PLAX 2D LVIDd:         4.42 cm         Diastology LVIDs:         2.81 cm         LV e' medial:    5.12 cm/s LV PW:         1.11 cm         LV E/e' medial:  21.1 LV IVS:        1.02 cm         LV e' lateral:   8.95 cm/s LVOT diam:     2.10 cm         LV E/e' lateral: 12.1 LV SV:         63 LV SV Index:   34              2D LVOT Area:     3.46 cm        Longitudinal Strain 2D Strain GLS  -15.9 % LV Volumes (MOD)               Avg: LV vol d, MOD     86.9 ml A2C:                           3D Volume EF LV vol d, MOD    75.0 ml       LV 3D EF:    Left A4C:                                        ventricul LV vol s, MOD    32.9 ml                    ar A2C:                                        ejection LV vol s, MOD    31.0 ml                    fraction A4C:  by 3D LV SV MOD A2C:   54.0 ml                    volume is LV SV MOD A4C:   75.0 ml                    61 %. LV SV MOD BP:    48.8 ml  3D Volume EF: 3D EF:        61 % LV EDV:       123 ml LV ESV:       48 ml LV SV:        74 ml  RIGHT VENTRICLE RV S prime:     8.55 cm/s TAPSE (M-mode): 1.3 cm  LEFT ATRIUM             Index        RIGHT ATRIUM           Index LA diam:        4.40 cm 2.39 cm/m   RA Area:     18.30 cm LA Vol (A2C):   97.1 ml 52.66 ml/m  RA Volume:   48.00 ml  26.03 ml/m LA Vol (A4C):   80.5 ml 43.66 ml/m LA Biplane Vol: 87.9 ml 47.67 ml/m AORTIC VALVE                     PULMONIC VALVE AV Area (Vmax):    2.00 cm      PV Vmax:          0.94 m/s AV Area (Vmean):   1.67 cm      PV Peak grad:     3.5 mmHg AV Area (VTI):     1.88 cm      PR End Diast Vel: 0.81 msec AV Vmax:           151.00 cm/s AV Vmean:          103.000 cm/s AV VTI:            0.337 m AV Peak Grad:      9.1 mmHg AV Mean Grad:      5.0 mmHg LVOT Vmax:         87.00 cm/s LVOT Vmean:        49.700 cm/s LVOT VTI:          0.183 m LVOT/AV VTI ratio: 0.54 AI PHT:            531 msec AR Vena Contracta: 0.16 cm  AORTA Ao Root diam: 3.60 cm Ao Asc diam:  3.10 cm Ao Arch diam: 3.1 cm  MITRAL VALVE                TRICUSPID VALVE MV Area (PHT): 3.76 cm     TR Peak grad:   22.3 mmHg MV Decel Time: 202 msec     TR Vmax:        236.00 cm/s MV E velocity: 108.00 cm/s MV A velocity: 57.60 cm/s   SHUNTS MV E/A ratio:  1.88         Systemic VTI:  0.18 m Systemic Diam: 2.10 cm  Laurance Flatten MD Electronically signed by Laurance Flatten  MD Signature Date/Time: 01/22/2022/3:30:55 PM    Final            Recent Labs: 01/31/2023: ALT 16; BUN 29; Creat 1.28; Hemoglobin 11.4; Magnesium 2.5; Platelets 228; Potassium 4.6; Sodium 141; TSH 2.40  Recent  Lipid Panel    Component Value Date/Time   CHOL 149 04/27/2022 0945   CHOL 164 07/03/2018 1241   TRIG 115 04/27/2022 0945   HDL 82 04/27/2022 0945   HDL 93 07/03/2018 1241   CHOLHDL 1.8 04/27/2022 0945   VLDL 22 05/06/2021 0432   LDLCALC 47 04/27/2022 0945    History of Present Illness    72 year old male with the above past medical history including CAD s/p CABG x 5 (LIMA-LAD, SVG-D1, SVG-OM/OM2, SVG-PDA) in 2009, DES-LCx in 2017, frequent PVCs, hypertension, hyperlipidemia with statin intolerance, carotid artery disease, TIA, laryngectomy, thyroidectomy, and prostate cancer.  Myoview in 2013 was low risk.  Repeat Myoview in June 2017 showed large reversible lateral perfusion defect consistent with ischemia.  Follow-up cardiac catheterization in 05/2016 showed severe native and graft disease, occluded RCA graft and OM graft, significant native left circumflex disease.  He underwent DES-LCx into OM branch in 06/2016. He underwent right carotid artery stenting per vascular surgery in July 2022 in the setting of severe carotid artery stenosis.  Most recent carotid ultrasound in 03/2022 revealed 50 to 70% R ICA stenosis, 40 to 59% LICA stenosis.  He has a history of statin intolerance and is on Praluent.  Additionally, he has a history of orthostatic hypotension with intermittent dizziness.  He was last seen in the office on 11/28/2022 and was stable overall from a cardiac standpoint.  He reported labile BP.  He was referred to the hypertension clinic and was last seen on 02/07/2023.  He noted ongoing labile BP, but was tolerating low-dose amlodipine.  He presents today for follow-up.  Since his last visit been stable overall from a cardiac standpoint.  He continues to note labile BP, however,  most BP readings have shown SBP ranging from the 130s to 170s.  He has also noted an increase in bigeminy, which he has captured with home EKG tracings.=.  He was previously on metoprolol, however, this was discontinued in setting of hypotension.  He has noticed some lightheadedness with frequent PVCs.  He has very mild dyspnea on exertion, unchanged from prior visits, he denies any other symptoms concerning for angina.  He is frustrated by his ongoing labile BP.  Home Medications    Current Outpatient Medications  Medication Sig Dispense Refill   amLODipine (NORVASC) 2.5 MG tablet Take 1 tablet (2.5 mg total) by mouth daily. 90 tablet 3   amphetamine-dextroamphetamine (ADDERALL) 20 MG tablet Take 1 tablet (20 mg total) by mouth 3 (three) times daily. 270 tablet 0   aspirin EC 81 MG tablet Take 81 mg by mouth daily. Swallow whole.     buPROPion (WELLBUTRIN XL) 150 MG 24 hr tablet Take 1 tablet (150 mg total) by mouth in the morning. 90 tablet 4   COVID-19 mRNA bivalent vaccine, Pfizer, (PFIZER COVID-19 VAC BIVALENT) injection Inject into the muscle. 0.3 mL 0   COVID-19 mRNA Vac-TriS, Pfizer, (COMIRNATY) SUSP injection Inject into the muscle. 0.3 mL 0   Evolocumab (REPATHA SURECLICK) 140 MG/ML SOAJ Inject 140 mg into the skin every 14 (fourteen) days. 6 mL 3   ferrous sulfate 325 (65 FE) MG EC tablet Take 1 tablet (325 mg total) by mouth 2 (two) times daily after a meal. Can take the treatment every other day to minimize side-effects. 90 tablet 2   influenza vaccine adjuvanted (FLUAD QUADRIVALENT) 0.5 ML injection Inject into the muscle. 0.5 mL 0   influenza vaccine adjuvanted (FLUAD) 0.5 ML injection Inject into the muscle. 0.5 mL  0   levothyroxine (SYNTHROID) 100 MCG tablet Take 1 tablet (100 mcg total) by mouth in the morning. 90 tablet 0   LORazepam (ATIVAN) 1 MG tablet Take 1 tablet by mouth three times daily as needed 270 tablet 1   metoprolol tartrate (LOPRESSOR) 25 MG tablet Take 0.5 tablets  (12.5 mg total) by mouth 2 (two) times daily. 90 tablet 3   pantoprazole (PROTONIX) 40 MG tablet TAKE 1 TABLET (40 MG TOTAL) BY MOUTH DAILY. 90 tablet 3   sertraline (ZOLOFT) 100 MG tablet Take 1 tablet (100 mg total) by mouth daily. 90 tablet 4   albuterol (VENTOLIN HFA) 108 (90 Base) MCG/ACT inhaler Inhale 2 puffs into the lungs every 6 (six) hours as needed for wheezing or shortness of breath. (Patient not taking: Reported on 02/27/2023) 18 g 5   Current Facility-Administered Medications  Medication Dose Route Frequency Provider Last Rate Last Admin   ticagrelor (BRILINTA) tablet 90 mg  90 mg Oral BID [name], MD         Review of Systems    He denies chest pain, pnd, orthopnea, n, v, dizziness, syncope, edema, weight gain, or early satiety. All other systems reviewed and are otherwise negative except as noted above.   Physical Exam    VS:  BP 120/64   Pulse 71   Ht 5\' 6"  (1.676 m)   Wt 151 lb (68.5 kg)   SpO2 95%   BMI 24.37 kg/m   GEN: Well nourished, well developed, in no acute distress. HEENT: normal. Neck: Supple, no JVD, carotid bruits, or masses. Cardiac: RRR, no murmurs, rubs, or gallops. No clubbing, cyanosis, edema.  Radials/DP/PT 2+ and equal bilaterally.  Respiratory:  Respirations regular and unlabored, clear to auscultation bilaterally. GI: Soft, nontender, nondistended, BS + x 4. MS: no deformity or atrophy. Skin: warm and dry, no rash. Neuro:  Strength and sensation are intact. Psych: Normal affect.  Accessory Clinical Findings    ECG personally reviewed by me today - No EKG in office today.   Lab Results  Component Value Date   WBC 5.7 01/31/2023   HGB 11.4 (L) 01/31/2023   HCT 36.9 (L) 01/31/2023   MCV 74.7 (L) 01/31/2023   PLT 228 01/31/2023   Lab Results  Component Value Date   CREATININE 1.28 01/31/2023   BUN 29 (H) 01/31/2023   NA 141 01/31/2023   K 4.6 01/31/2023   CL 105 01/31/2023   CO2 26 01/31/2023   Lab Results  Component  Value Date   ALT 16 01/31/2023   AST 17 01/31/2023   ALKPHOS 57 05/05/2021   BILITOT 0.3 01/31/2023   Lab Results  Component Value Date   CHOL 149 04/27/2022   HDL 82 04/27/2022   LDLCALC 47 04/27/2022   TRIG 115 04/27/2022   CHOLHDL 1.8 04/27/2022    Lab Results  Component Value Date   HGBA1C 5.5 01/01/2017    Assessment & Plan    1. CAD:  S/p CABG x 5 (LIMA-LAD, SVG-D1, SVG-OM/OM2, SVG-PDA) in 2009, DES-LCx in 2017. Stable with no anginal symptoms. No indication for ischemic evaluation.  Continue aspirin, amlodipine, metoprolol as below, Repatha.  2. Hypertension: History of labile BP.  He continues to note labile BP.  He has had fewer low readings, he continues to have SBP readings in the 130s to 170s.  He is tolerating low-dose amlodipine.  Will restart metoprolol as below.  If BP remains elevated, consider increasing amlodipine versus increasing metoprolol.  Follow-up  with hypertension clinic Pharm.D.  3. 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  He has noted some associated lightheadedness.  Previously on metoprolol.  Will restart metoprolol tartrate 12.5 mg twice daily.  Continue to monitor BP/HR, continue to monitor symptoms.  4. Hyperlipidemia: LDL was 47 in 03/2022. Monitored and managed per PCP.  Continue Repatha.  5. Carotid artery disease: S/p R carotid artery stenting in 2022. Most recent carotid ultrasound in 03/2022 revealed 50 to 70% R ICA stenosis, 40 to 59% LICA stenosis.  For repeat scan in 03/2023. Follows with vascular surgery.   6. Disposition: Follow-up hypertension clinic Pharm.D. in 1 month, follow-up with APP in 3 months.      Joylene Grapes, NP 02/27/2023, 1:55 PM

## 2023-02-27 NOTE — Patient Instructions (Signed)
Medication Instructions:  Start Metoprolol Tartrate 12.5 mg twice daily.   *If you need a refill on your cardiac medications before your next appointment, please call your pharmacy*   Lab Work: NONE ordered at this time of appointment   If you have labs (blood work) drawn today and your tests are completely normal, you will receive your results only by: MyChart Message (if you have MyChart) OR A paper copy in the mail If you have any lab test that is abnormal or we need to change your treatment, we will call you to review the results.   Testing/Procedures: NONE ordered at this time of appointment     Follow-Up: At [facility], you and your health needs are our priority.  As part of our continuing mission to provide you with exceptional heart care, we have created designated Provider Care Teams.  These Care Teams include your primary Cardiologist (physician) and Advanced Practice Providers (APPs -  Physician Assistants and Nurse Practitioners) who all work together to provide you with the care you need, when you need it.  We recommend signing up for the patient portal called "MyChart".  Sign up information is provided on this After Visit Summary.  MyChart is used to connect with patients for Virtual Visits (Telemedicine).  Patients are able to view lab/test results, encounter notes, upcoming appointments, etc.  Non-urgent messages can be sent to your provider as well.   To learn more about what you can do with MyChart, go to [email or web address].    Your next appointment:   3 month(s)  Provider:   Bernadene Person, NP        Other Instructions

## 2023-03-06 ENCOUNTER — Ambulatory Visit (HOSPITAL_COMMUNITY)
Admission: RE | Admit: 2023-03-06 | Discharge: 2023-03-06 | Disposition: A | Discharge status: Home / Self Care | Payer: Medicare Other | Source: Ambulatory Visit | Attending: Cardiovascular Disease | Admitting: Cardiovascular Disease

## 2023-03-06 DIAGNOSIS — I6523 Occlusion and stenosis of bilateral carotid arteries: Secondary | ICD-10-CM

## 2023-03-13 ENCOUNTER — Other Ambulatory Visit (HOSPITAL_BASED_OUTPATIENT_CLINIC_OR_DEPARTMENT_OTHER): Payer: Self-pay

## 2023-03-14 ENCOUNTER — Other Ambulatory Visit (HOSPITAL_BASED_OUTPATIENT_CLINIC_OR_DEPARTMENT_OTHER): Payer: Self-pay

## 2023-03-14 MED ORDER — TADALAFIL 20 MG PO TABS
20.0000 mg | ORAL_TABLET | ORAL | 2 refills | Status: DC | PRN
  Filled 2023-03-14: qty 10, 10d supply, fill #0

## 2023-03-21 ENCOUNTER — Ambulatory Visit: Payer: Medicare Other

## 2023-04-08 ENCOUNTER — Other Ambulatory Visit: Payer: Self-pay

## 2023-04-08 ENCOUNTER — Other Ambulatory Visit (HOSPITAL_BASED_OUTPATIENT_CLINIC_OR_DEPARTMENT_OTHER): Payer: Self-pay

## 2023-04-09 ENCOUNTER — Other Ambulatory Visit (HOSPITAL_BASED_OUTPATIENT_CLINIC_OR_DEPARTMENT_OTHER): Payer: Self-pay

## 2023-04-15 ENCOUNTER — Other Ambulatory Visit (HOSPITAL_BASED_OUTPATIENT_CLINIC_OR_DEPARTMENT_OTHER): Payer: Self-pay

## 2023-04-15 MED ORDER — LEVOTHYROXINE SODIUM 100 MCG PO TABS
100.0000 ug | ORAL_TABLET | Freq: Every morning | ORAL | 0 refills | Status: DC
  Filled 2023-04-15: qty 90, 90d supply, fill #0

## 2023-04-17 ENCOUNTER — Other Ambulatory Visit (HOSPITAL_BASED_OUTPATIENT_CLINIC_OR_DEPARTMENT_OTHER): Payer: Self-pay

## 2023-04-17 MED ORDER — SERTRALINE HCL 100 MG PO TABS
200.0000 mg | ORAL_TABLET | Freq: Every day | ORAL | 3 refills | Status: DC
  Filled 2023-04-17: qty 180, 90d supply, fill #0

## 2023-04-17 MED ORDER — BUPROPION HCL ER (XL) 300 MG PO TB24
300.0000 mg | ORAL_TABLET | Freq: Every morning | ORAL | 3 refills | Status: DC
  Filled 2023-04-17: qty 90, 90d supply, fill #0
  Filled 2023-07-12: qty 90, 90d supply, fill #1
  Filled 2023-11-28: qty 90, 90d supply, fill #2
  Filled 2023-12-07 – 2024-02-21 (×2): qty 90, 90d supply, fill #3

## 2023-05-16 ENCOUNTER — Other Ambulatory Visit (HOSPITAL_BASED_OUTPATIENT_CLINIC_OR_DEPARTMENT_OTHER): Payer: Self-pay

## 2023-05-16 ENCOUNTER — Other Ambulatory Visit: Payer: Self-pay

## 2023-05-16 MED ORDER — LORAZEPAM 1 MG PO TABS
1.0000 mg | ORAL_TABLET | ORAL | 0 refills | Status: AC
  Filled 2023-05-16: qty 225, 90d supply, fill #0

## 2023-06-07 NOTE — Progress Notes (Shared)
Annual Wellness Visit    Patient Care Team: Yanelly Cantrelle, Luanna Cole, MD as PCP - General (Internal Medicine) [name], MD as PCP - Cardiology (Cardiology) [name], MD as Consulting Physician (General Surgery) Janalyn Harder, MD (Inactive) as Consulting Physician (Dermatology)  Visit Date: 06/14/23   Chief Complaint  Patient presents with  . Medicare Wellness    Flaking and itching on forehead for years.     Subjective:   Patient: [name], Male    DOB: [birth year], 72 y.o.   MRN: [id number]  [name] is a 72 y.o. Male who presents today for his Annual Wellness Visit. History of ADHD, anxiety and depression, arthritis, coronary artery disease, carotid artery stenosis, COPD, GERD, hypertension, hyperlipidemia, hypothyroidism, migraine headaches, subclavian steal syndrome, transient ischemic attack, viral hepatitis.  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  He had total laryngectomy and cervical esophagectomy with partial thyroidectomy and bilateral neck dissection at that facility.  In January of this year MRI demonstrated no evidence of residual disease.  Recently was referred back to interventional radiology because of 2 lesions in his liver which were suspicious for malignancy.  He underwent uncomplicated CT-guided microwave ablation of 2 separate lesions May 03, 2023.  He has been having some right shoulder pain. Pain is worse when he is washing his hair. Followed by orthopedist and has received injections without relief. He would like a new referral.  He has had some seborrheic dermatitis on his scalp that is causing some discomfort.  9/4//24 PET/CT showed solid pulmonary nodule measuring 5 mm in the right lower lobe without focal increased metabolic uptake.  Being referred to radiation therapy for stereotactic radiation to right middle lobe pulmonary nodule.  This is due to  increase in size of the right middle lobe pulmonary nodule.  Liver biopsy proven 1.9 x 2.8 cm lesion malignant lesion involving segment 7 of the liver, CK7 positive status post microwave ablation by Dr Alessandra Grout on 08/30/22 with radiologic studies postprocedure consistent with a complete response.   History of hyperlipidemia treated with Repatha 140 mg every fourteen days. Lipid panel normal.  Hide colonoscopy October 19, 2022 showing a 2 subcentimeter ascending colon polyps status post polypectomy.  Few sigmoid diverticuli otherwise normal.  Patient is to have repeat PET CT and MRI of the abdomen in early December.  History of hypertension treated with amlodipine 2.5 mg daily, metoprolol tartrate 12.5 mg twice daily. Reports he has some fluctuation of blood pressure at home where blood pressure becomes elevated but this resolves quickly. Blood pressure normal today at 110/70.  History of ADD, anxiety and depression treated with Adderall 20 mg three times daily, bupropion 300 mg in the morning, sertraline 200 mg daily, lorazepam 1 mg two to three times daily as needed.  History of hypothyroidism treated with levothyroxine 100 mcg daily. Requesting thyroid check.  Takes ferrous sulfate 325 mg twice daily.  Has not tolerated oral iron and will be referred by his oncology team for IV iron replacement.  He is to have repeat labs PET/CT MRI of the liver in 3 months  History of asthma treated with albuterol inhaler as needed.  He is followed at [facility] for history of prostate cancer.  He had radical assisted robotic prostatectomy November 2021.  He completed radiation treatments.  His PSA was in the 3-4 range but increased to 6.6 in March 2020.  Subsequently PSA  increased to 13.25 in October 2021 and a biopsy done confirmed adenocarcinoma of the prostate with Gleason score of 7.   History of right carotid endarterectomy.  Dr. York Ram is his cardiologist.  He has a  history of coronary artery disease.  History of hyperlipidemia but is statin intolerant.  History of low pressure differential in each arm due to subclavian stenosis.  History of hypertension.   History of issues with swallowing in May 2019 and underwent colonoscopy and upper endoscopy.  He had 3 small sessile polyps on colonoscopy.  He was having some dysphagia and had history of GE reflux.  He had normal stomach and normal duodenum on endoscopy.  There were esophageal mucosal changes but no evidence of cancer or eosinophilic esophagitis.  Biopsies were consistent with esophagitis and polyps were hyperplastic.   He has seen Dr. Sherri Rad, pulmonologist, in December 2020 for shortness of breath and was treated with an inhaler   He underwent L2-L3 discectomy September 2020 by Dr. Shon Baton for severe intractable low back pain.   History of white blood cell count in the 3700 range which has been followed for several years and is asymptomatic.   History of neck pain previously seen by Dr. Venetia Maxon.  Had numbness of left face but not neck with intermittent paresthesias down left arm.  He had MRI in late 2021 of C-spine showing significant facet arthropathy on the left at C2-C3 and C3-C4.  There was a fluid cyst in the left C3-C4 facet joint and mild degenerative changes on the right at C6-C7.  Dr. Venetia Maxon believed this to be the basis for his left-sided occipital neck and jaw pain.  He recommended facet blocks at C2-C3 and C3-C4 levels on the left.   History of CABG x5 in 2009.  Remote history of smoking, history of left Horner syndrome, history of stroke, cervical disc disease, squamous cell carcinoma of the tongue, external carotid artery stenosis on the left, history of migraine headaches, allergic rhinitis, and history of back pain.   In December 2017 he had PCI and drug-eluting stent placement of a high-grade circumflex and first obtuse marginal branch stenosis in the setting of an occluded sequential vein graft  with patent continuation of the OM1-OM 2   History of 70% right subclavian artery stenosis.  Percutaneous intervention was considered but has not been done.   He has a 50 mm upper extremity blood pressure differential due to right subclavian and/or innominate stenosis.  He is on chronic anticoagulation with Plavix.  History of left shoulder arthroscopy with subacromial decompression and open rotator cuff repair by Dr. Devonne Doughty in 2014.  Open distal clavicle resection and open biceps tendon diathesis.   Intolerant of statins.  Dr. Gery Pray has treated him with Praluent injections every 14 days.   History of GE reflux treated with Protonix.   In January 2018 he was seen in the emergency department with palpitations, diaphoresis and lightheadedness.  D-dimer was elevated at 0.75.  CT angio of the chest with contrast showed no evidence of PE.  He was thought not to have had an MI and was discharged home.  CT angios of chest and aorta with and without contrast showed 70% proximal right subclavian artery stenosis.  History of mild plaque in left subclavian artery.  History of mild plaque in distal right subclavian artery.   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  He had discectomy of the C-spine around 2000.   He developed left eye ptosis after cardiac  surgery.  Says his face feels numb when he turns his head to the left.   Had left thalamic stroke in 2005.  History of numbness of the left cheek in the C3 distribution.  Documented facet disease on the left at C2-C3, C4-C5, C5-C6 as well as C6-C7 which is aggravated by turning his head.   History of GE reflux.   Says he has remote history of viral hepatitis a number of years ago.   Patient says his neurologist, Dr. Sandria Manly, who is now retired felt his carotid disease was due to radiation therapy.   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  Glucose normal. Kidney, liver functions normal.  Electrolytes normal. Blood proteins normal. RDW elevated at 17.9, absolute lymphocytes low at 398. PSA less than 0.04.  Colonoscopy last completed 10/19/22. Showed two subcentimeter ascending colon polyp, few sigmoid diverticula. Pathology showed tubular adenoma. Endoscopy also on 10/19/22 showed mild superficial chronic gastritis, focal changes suggestive of reactive gastropathy, no H Pylori identified on H&E slides.  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  He smoked for some 30 years but quit over 20 years ago.  1 son from previous marriage.  This is his second marriage.  He does drink beer sometimes.   Family history: Mother died of ovarian cancer.  Father died apparently of heart failure but also had history of stroke around age 43 and diabetes.  Maternal grandmother, paternal grandmother with history of cancer.  He has 2 brothers.  Past Medical History:  Diagnosis Date  . ADHD (attention deficit hyperactivity disorder)   . Anxiety   . Arthritis    "NECK, HANDS, KNEES" (06/14/2016)  . Atypical mole 08/07/2021   Mid Back (Atypical Proliferation)  . CAD (coronary artery disease)   . Carotid artery stenosis   . Complication of anesthesia    Reports that he had trouble swallowing post anesthesia- CABG  . COPD (chronic obstructive pulmonary disease) (HCC) 12/11/2011   r/s mv - EF 72%; exercise capacity 13 METS; no exercised induced ischemic EKG changes  . Depression, major   . Dyspnea    with exertion  . GERD (gastroesophageal reflux disease)   . HTN (hypertension), benign 04/16/2008   echo - EF >55%; no regional wall or valvular abnormalities  . Hyperlipidemia    statin intolerant  . Hypothyroidism   . Migraine    "crippling headaches as a kid; silent migraines now, no pain, couple times/week" (06/14/2016)  . Peripheral  vascular disease (HCC)   . Prostate cancer (HCC) 04/2019   recent dx - Dr [name]  . S/P angioplasty with stent 06/14/16 PCI & DES of high grade AV groove LCX and 1st OM  06/15/2016  . Squamous cell cancer of tongue (HCC) ~ 2002   "[facility] at [facility]  . Subclavian steal syndrome 06/17/2012   carotid doppler - R systolic brachial pressure , L ; R subclavian artery - proxmial obsstruction w/ abnormal monophasic waveforms, R ECA known occlusive disease; L ECA narrowing w/ 70-99% diameter reductiona  . TIA (transient ischemic attack) 2005  . Viral hepatitis 1970s   "non specific"     Family History  Problem Relation Age of Onset  . Cancer Mother        ovarian  .  Stroke Father   . Diabetes Father   . Arthritis Father   . Hypertension Brother      Social history: Married.  No children from this marriage.  Has an adult son from previous marriage who lives in the [place] area.    Review of Systems  Constitutional:  Negative for chills, fever, malaise/fatigue and weight loss.  HENT:  Negative for hearing loss, sinus pain and sore throat.   Respiratory:  Negative for cough, hemoptysis and shortness of breath.   Cardiovascular:  Negative for chest pain, palpitations, leg swelling and PND.  Gastrointestinal:  Negative for abdominal pain, constipation, diarrhea, heartburn, nausea and vomiting.  Genitourinary:  Negative for dysuria, frequency and urgency.  Musculoskeletal:  Positive for joint pain (Right shoulder). Negative for back pain, myalgias and neck pain.  Skin:  Negative for itching and rash.       (+) Dermatitis scalp  Neurological:  Negative for dizziness, tingling, seizures and headaches.  Endo/Heme/Allergies:  Negative for polydipsia.  Psychiatric/Behavioral:  Negative for depression. The patient is not nervous/anxious.       Objective:   Vitals: BP 110/70   Pulse 62   Ht 5\' 6"  (1.676 m)   Wt 150 lb (68 kg)   SpO2 98%   BMI 24.21 kg/m    Physical Exam Vitals and nursing note reviewed.  Constitutional:      General: He is awake. He is not in acute distress.    Appearance: Normal appearance. He is not ill-appearing or toxic-appearing.  HENT:     Head: Normocephalic and atraumatic.     Right Ear: Hearing, tympanic membrane, ear canal and external ear normal.     Left Ear: Hearing, tympanic membrane, ear canal and external ear normal.     Ears:     Comments: Some cerumen left ear canal.    Mouth/Throat:     Pharynx: Oropharynx is clear.     Comments: Mucosa slightly pale. Eyes:     Extraocular Movements: Extraocular movements intact.     Pupils: Pupils are equal, round, and reactive to light.  Neck:     Thyroid: No thyroid mass, thyromegaly or thyroid tenderness.     Vascular: No carotid bruit.  Cardiovascular:     Rate and Rhythm: Normal rate and regular rhythm. No extrasystoles are present.    Pulses:          Dorsalis pedis pulses are 2+ on the right side and 2+ on the left side.     Heart sounds: Normal heart sounds. No murmur heard.    No friction rub. No gallop.  Pulmonary:     Effort: Pulmonary effort is normal.     Breath sounds: Normal breath sounds. No decreased breath sounds, wheezing, rhonchi or rales.  Chest:     Chest wall: No mass.  Abdominal:     Palpations: Abdomen is soft.     Tenderness: There is no abdominal tenderness.     Hernia: No hernia is present.  Musculoskeletal:     Cervical back: Normal range of motion.     Right lower leg: No edema.     Left lower leg: No edema.  Lymphadenopathy:     Cervical: No cervical adenopathy.     Upper Body:     Right upper body: No supraclavicular adenopathy.     Left upper body: No supraclavicular adenopathy.  Skin:    General: Skin is warm and dry.  Neurological:     General: No focal deficit  present.     Mental Status: He is alert and oriented to person, place, and time. Mental status is at baseline.     Cranial Nerves: Cranial nerves 2-12  are intact.     Sensory: Sensation is intact.     Motor: Motor function is intact.     Coordination: Coordination is intact.     Gait: Gait is intact.     Deep Tendon Reflexes: Reflexes are normal and symmetric.  Psychiatric:        Attention and Perception: Attention normal.        Mood and Affect: Mood normal.        Speech: Speech normal.        Behavior: Behavior normal. Behavior is cooperative.        Thought Content: Thought content normal.        Cognition and Memory: Cognition and memory normal.        Judgment: Judgment normal.     Most recent functional status assessment:    06/14/2023   10:01 AM  In your present state of health, do you have any difficulty performing the following activities:  Hearing? 1  Vision? 0  Difficulty concentrating or making decisions? 0  Walking or climbing stairs? 0  Dressing or bathing? 0  Doing errands, shopping? 0  Preparing Food and eating ? N  Using the Toilet? N  In the past six months, have you accidently leaked urine? Y  Do you have problems with loss of bowel control? N  Managing your Medications? N  Managing your Finances? N  Housekeeping or managing your Housekeeping? N   Most recent fall risk assessment:    06/14/2023   10:10 AM  Fall Risk   Falls in the past year? 0  Number falls in past yr: 0  Injury with Fall? 0  Risk for fall due to : No Fall Risks    Most recent depression screenings:    01/31/2023   10:38 AM 03/21/2020    5:18 PM  PHQ 2/9 Scores  PHQ - 2 Score 4 2  PHQ- 9 Score 12 2   Most recent cognitive screening:    06/14/2023   10:07 AM  6CIT Screen  What Year? 0 points  What month? 0 points  What time? 0 points  Count back from 20 0 points  Months in reverse 0 points  Repeat phrase 0 points  Total Score 0 points     Results:   Studies obtained and personally reviewed by me:  Colonoscopy last completed 10/19/22. Showed two subcentimeter ascending colon polyp, few sigmoid diverticula.  Pathology showed tubular adenoma. Endoscopy also on 10/19/22 showed mild superficial chronic gastritis, focal changes suggestive of reactive gastropathy, no H Pylori identified on H&E slides.  Labs:       Component Value Date/Time   NA 143 06/11/2023 0945   K 4.0 06/11/2023 0945   CL 107 06/11/2023 0945   CO2 28 06/11/2023 0945   GLUCOSE 94 06/11/2023 0945   BUN 25 06/11/2023 0945   CREATININE 1.14 06/11/2023 0945   CALCIUM 8.8 06/11/2023 0945   PROT 6.1 06/11/2023 0945   ALBUMIN 3.6 05/05/2021 0852   AST 14 06/11/2023 0945   ALT 18 06/11/2023 0945   ALKPHOS 57 05/05/2021 0852   BILITOT 0.3 06/11/2023 0945   GFRNONAA 59 (L) 05/06/2021 0432   GFRNONAA 69 03/30/2021 0950   GFRAA 80 03/30/2021 0950     Lab Results  Component Value Date  WBC 4.1 06/11/2023   HGB 13.9 06/11/2023   HCT 41.4 06/11/2023   MCV 86.3 06/11/2023   PLT 151 06/11/2023    Lab Results  Component Value Date   CHOL 120 06/11/2023   HDL 49 06/11/2023   LDLCALC 48 06/11/2023   TRIG 142 06/11/2023   CHOLHDL 2.4 06/11/2023    Lab Results  Component Value Date   HGBA1C 5.5 01/01/2017     Lab Results  Component Value Date   TSH 2.40 01/31/2023     Lab Results  Component Value Date   PSA <0.04 06/11/2023   PSA <0.04 04/27/2022   PSA <0.04 03/30/2021    Assessment & Plan:   Right shoulder pain: has had injections without relief. Referral for orthopedist.  Seborrheic dermatitis scalp: prescribed Lotrimin 1% twice daily.  He will be seeing radiation oncologist, Dr. Daleen Squibb, on 06/18/23 for pulmonary nodule.  Hyperlipidemia: treated with Repatha 140 mg every fourteen days. Lipid panel normal.  Hypertension: treated with amlodipine 2.5 mg daily, metoprolol tartrate 12.5 mg twice daily. Reports he has some fluctuation of blood pressure at home where blood pressure becomes elevated but this resolves quickly. Blood pressure normal today at 110/70.  ADD; anxiety and depression: stable with  Adderall 20 mg three times daily, bupropion 300 mg in the morning, sertraline 200 mg daily, lorazepam 1 mg two to three times daily as needed.  Hypothyroidism: treated with levothyroxine 100 mcg daily. Ordered TSH, free T4.  Asthma: treated with albuterol inhaler as needed.  History of prostate cancer being [facility] at [facility].  Gleason score at the time of diagnosis was 7.  Urge incontinence treated with Vesicare   Erectile dysfunction treated with Cialis   History of left shoulder arthropathy status post surgery   History of COPD seen by Pulmonology.  He quit smoking in 1995.  Takes ferrous sulfate 325 mg twice daily.  History of PCI by Dr. Allyson Sabal   History of coronary artery disease status post CABG   Essential hypertension-stable   Longstanding history of carotid disease   History of carcinoma base of tongue   History of COVID-19 2022   History of right transcarotid artery revascularization by Dr. Margo Aye can in August 2022   Anxiety depression treated by Dr. Evelene Croon, psychiatrist   GE reflux treated with PPI   Chronic anticoagulation with Brilinta   History of left total thalamic stroke 2005   History of left Horner syndrome   History of right vertebral artery occlusion  Prostate exam deferred.  He is followed by  Dr. Suzette Battiest.  PSA on 06/11/2023 was less than 0.04.  Had been 12.6 in 2021 and since that time has been less than 0.04.  Vaccine counseling: suggested he get flu, Covid-19, RSV vaccine.  Return in 1 year or as needed.  Currently the majority of his care is involved with oncology at [facility].     Annual wellness visit done today including the all of the following: Reviewed patient's Family Medical History Reviewed and updated list of patient's medical providers Assessment of cognitive impairment was done Assessed patient's functional ability Established a written schedule for health screening  services Health Risk Assessent Completed and Reviewed  Discussed health benefits of physical activity, and encouraged him to engage in regular exercise appropriate for his age and condition.        I,[name],acting as a Neurosurgeon for Margaree Mackintosh, MD.,have documented all relevant documentation on the behalf of  Margaree Mackintosh, MD,as directed by  Margaree Mackintosh, MD while in the presence of Margaree Mackintosh, MD.   ***

## 2023-06-11 ENCOUNTER — Other Ambulatory Visit: Payer: Medicare Other

## 2023-06-11 DIAGNOSIS — I10 Essential (primary) hypertension: Secondary | ICD-10-CM

## 2023-06-11 DIAGNOSIS — E7849 Other hyperlipidemia: Secondary | ICD-10-CM

## 2023-06-11 DIAGNOSIS — C61 Malignant neoplasm of prostate: Secondary | ICD-10-CM

## 2023-06-11 DIAGNOSIS — Z0000 Encounter for general adult medical examination without abnormal findings: Secondary | ICD-10-CM

## 2023-06-11 DIAGNOSIS — E039 Hypothyroidism, unspecified: Secondary | ICD-10-CM

## 2023-06-12 ENCOUNTER — Other Ambulatory Visit (HOSPITAL_BASED_OUTPATIENT_CLINIC_OR_DEPARTMENT_OTHER): Payer: Self-pay

## 2023-06-12 LAB — PSA: PSA: <0.04 ng/mL (ref <=4.00)

## 2023-06-12 LAB — CBC WITH DIFFERENTIAL/PLATELET
Absolute Monocytes: 349 {cells}/uL (ref 200–950)
Basophils Absolute: 62 {cells}/uL (ref 0–200)
Basophils Relative: 1.5 %
Eosinophils Absolute: 201 {cells}/uL (ref 15–500)
Eosinophils Relative: 4.9 %
HCT: 41.4 % (ref 38.5–50.0)
Hemoglobin: 13.9 g/dL (ref 13.2–17.1)
Lymphs Abs: 398 {cells}/uL — ABNORMAL LOW (ref 850–3900)
MCH: 29 pg (ref 27.0–33.0)
MCHC: 33.6 g/dL (ref 32.0–36.0)
MCV: 86.3 fL (ref 80.0–100.0)
MPV: 8.8 fL (ref 7.5–12.5)
Monocytes Relative: 8.5 %
Neutro Abs: 3091 {cells}/uL (ref 1500–7800)
Neutrophils Relative %: 75.4 %
Platelets: 151 10*3/uL (ref 140–400)
RBC: 4.8 10*6/uL (ref 4.20–5.80)
RDW: 17.9 % — ABNORMAL HIGH (ref 11.0–15.0)
Total Lymphocyte: 9.7 %
WBC: 4.1 10*3/uL (ref 3.8–10.8)

## 2023-06-12 LAB — COMPLETE METABOLIC PANEL WITH GFR
AG Ratio: 2.1 (calc) (ref 1.0–2.5)
ALT: 18 U/L (ref 9–46)
AST: 14 U/L (ref 10–35)
Albumin: 4.1 g/dL (ref 3.6–5.1)
Alkaline phosphatase (APISO): 82 U/L (ref 35–144)
BUN: 25 mg/dL (ref 7–25)
CO2: 28 mmol/L (ref 20–32)
Calcium: 8.8 mg/dL (ref 8.6–10.3)
Chloride: 107 mmol/L (ref 98–110)
Creat: 1.14 mg/dL (ref 0.70–1.28)
Globulin: 2 g/dL (ref 1.9–3.7)
Glucose, Bld: 94 mg/dL (ref 65–99)
Potassium: 4 mmol/L (ref 3.5–5.3)
Sodium: 143 mmol/L (ref 135–146)
Total Bilirubin: 0.3 mg/dL (ref 0.2–1.2)
Total Protein: 6.1 g/dL (ref 6.1–8.1)
eGFR: 69 mL/min/{1.73_m2} (ref >=60)

## 2023-06-12 LAB — LIPID PANEL
Cholesterol: 120 mg/dL (ref <200)
HDL: 49 mg/dL (ref >=40)
LDL Cholesterol (Calc): 48 mg/dL
Non-HDL Cholesterol (Calc): 71 mg/dL (ref <130)
Total CHOL/HDL Ratio: 2.4 (calc) (ref <5.0)
Triglycerides: 142 mg/dL (ref <150)

## 2023-06-12 MED ORDER — FLUCONAZOLE 150 MG PO TABS
150.0000 mg | ORAL_TABLET | Freq: Every day | ORAL | 0 refills | Status: DC
  Filled 2023-06-12 (×2): qty 14, 14d supply, fill #0

## 2023-06-14 ENCOUNTER — Other Ambulatory Visit (HOSPITAL_BASED_OUTPATIENT_CLINIC_OR_DEPARTMENT_OTHER): Payer: Self-pay

## 2023-06-14 ENCOUNTER — Encounter: Payer: Self-pay | Admitting: Internal Medicine

## 2023-06-14 ENCOUNTER — Ambulatory Visit (INDEPENDENT_AMBULATORY_CARE_PROVIDER_SITE_OTHER): Payer: Medicare Other | Admitting: Internal Medicine

## 2023-06-14 VITALS — BP 110/70 | HR 62 | Ht 66.0 in | Wt 150.0 lb

## 2023-06-14 DIAGNOSIS — Z7901 Long term (current) use of anticoagulants: Secondary | ICD-10-CM

## 2023-06-14 DIAGNOSIS — F4323 Adjustment disorder with mixed anxiety and depressed mood: Secondary | ICD-10-CM | POA: Diagnosis not present

## 2023-06-14 DIAGNOSIS — R5381 Other malaise: Secondary | ICD-10-CM | POA: Diagnosis not present

## 2023-06-14 DIAGNOSIS — E7849 Other hyperlipidemia: Secondary | ICD-10-CM

## 2023-06-14 DIAGNOSIS — K224 Dyskinesia of esophagus: Secondary | ICD-10-CM

## 2023-06-14 DIAGNOSIS — E039 Hypothyroidism, unspecified: Secondary | ICD-10-CM

## 2023-06-14 DIAGNOSIS — J449 Chronic obstructive pulmonary disease, unspecified: Secondary | ICD-10-CM

## 2023-06-14 DIAGNOSIS — C61 Malignant neoplasm of prostate: Secondary | ICD-10-CM

## 2023-06-14 DIAGNOSIS — Z8709 Personal history of other diseases of the respiratory system: Secondary | ICD-10-CM

## 2023-06-14 DIAGNOSIS — D0007 Carcinoma in situ of tongue: Secondary | ICD-10-CM

## 2023-06-14 DIAGNOSIS — F32A Depression, unspecified: Secondary | ICD-10-CM

## 2023-06-14 DIAGNOSIS — I771 Stricture of artery: Secondary | ICD-10-CM

## 2023-06-14 DIAGNOSIS — Z0000 Encounter for general adult medical examination without abnormal findings: Secondary | ICD-10-CM | POA: Diagnosis not present

## 2023-06-14 DIAGNOSIS — K2100 Gastro-esophageal reflux disease with esophagitis, without bleeding: Secondary | ICD-10-CM

## 2023-06-14 DIAGNOSIS — Z8673 Personal history of transient ischemic attack (TIA), and cerebral infarction without residual deficits: Secondary | ICD-10-CM

## 2023-06-14 DIAGNOSIS — Z951 Presence of aortocoronary bypass graft: Secondary | ICD-10-CM

## 2023-06-14 DIAGNOSIS — R531 Weakness: Secondary | ICD-10-CM

## 2023-06-14 DIAGNOSIS — I10 Essential (primary) hypertension: Secondary | ICD-10-CM

## 2023-06-14 DIAGNOSIS — C131 Malignant neoplasm of aryepiglottic fold, hypopharyngeal aspect: Secondary | ICD-10-CM

## 2023-06-14 LAB — POCT URINALYSIS DIP (CLINITEK)
Blood, UA: NEGATIVE
Glucose, UA: NEGATIVE mg/dL
Ketones, POC UA: NEGATIVE mg/dL
Leukocytes, UA: NEGATIVE
Nitrite, UA: NEGATIVE
Spec Grav, UA: 1.015 (ref 1.010–1.025)
Urobilinogen, UA: 0.2 U/dL
pH, UA: 6 (ref 5.0–8.0)

## 2023-06-14 MED ORDER — CLOTRIMAZOLE 1 % EX CREA
1.0000 | TOPICAL_CREAM | Freq: Two times a day (BID) | CUTANEOUS | 0 refills | Status: DC
  Filled 2023-06-14: qty 28, 14d supply, fill #0

## 2023-06-15 LAB — T4, FREE: Free T4: 1.1 ng/dL (ref 0.8–1.8)

## 2023-06-15 LAB — TSH: TSH: 3.07 m[IU]/L (ref 0.40–4.50)

## 2023-06-16 NOTE — Patient Instructions (Addendum)
Patient will see orthopedist about shoulder pain. He will continue to see Urologist annually. Vaccine recommendations given.Continue close follow up with Oncology at [facility]. May return in one year or as needed. It was great to see you today.

## 2023-06-17 ENCOUNTER — Encounter: Payer: Self-pay | Admitting: Nurse Practitioner

## 2023-06-17 ENCOUNTER — Other Ambulatory Visit (HOSPITAL_BASED_OUTPATIENT_CLINIC_OR_DEPARTMENT_OTHER): Payer: Self-pay

## 2023-06-17 ENCOUNTER — Ambulatory Visit: Payer: Medicare Other | Attending: Nurse Practitioner | Admitting: Nurse Practitioner

## 2023-06-17 VITALS — BP 100/52 | HR 67 | Ht 67.0 in | Wt 149.0 lb

## 2023-06-17 DIAGNOSIS — I2510 Atherosclerotic heart disease of native coronary artery without angina pectoris: Secondary | ICD-10-CM | POA: Insufficient documentation

## 2023-06-17 DIAGNOSIS — E785 Hyperlipidemia, unspecified: Secondary | ICD-10-CM | POA: Insufficient documentation

## 2023-06-17 DIAGNOSIS — I10 Essential (primary) hypertension: Secondary | ICD-10-CM | POA: Insufficient documentation

## 2023-06-17 DIAGNOSIS — I6523 Occlusion and stenosis of bilateral carotid arteries: Secondary | ICD-10-CM | POA: Insufficient documentation

## 2023-06-17 DIAGNOSIS — I498 Other specified cardiac arrhythmias: Secondary | ICD-10-CM | POA: Insufficient documentation

## 2023-06-17 MED ORDER — METOPROLOL SUCCINATE ER 25 MG PO TB24
12.5000 mg | ORAL_TABLET | Freq: Every day | ORAL | 11 refills | Status: DC
  Filled 2023-06-17: qty 30, 60d supply, fill #0
  Filled 2023-07-12 – 2023-08-25 (×2): qty 30, 60d supply, fill #1

## 2023-06-17 NOTE — Progress Notes (Signed)
statin intolerant. 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 A Myoview stress test performed December 11, 2011, was normal and carotid Dopplers did show a moderately severe right ICA stenosis which we have been following by duplex ultrasound. 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 Since I saw him 12 months ago he has noticed increasing dyspnea on exertion over the last several months, especially noticeable while mowing his lawn. I obtained a Myoview stress test on him based on this 03/29/16 that showed a large reversible lateral wall perfusion defect  consistent  with ischemia. He underwent outpatient cardiac catheterization by myself on 05/14/16 revealing severe native and graft vessel disease with normal LV function. Specifically, his RCA vein graft was occluded to an occluded dominant right and his circumflex vein graft which was sequential to 2 obtuse marginal branches was occluded at the aorta to patent continuation between OM1 and 2. He did have native circumflex disease. Dr. Dorris Fetch saw him during his brief hospitalization to discuss the possibility of redo coronary artery bypass grafting although the consensus was that he would be better served with attempted PCI and stenting of his native circumflex coronary artery   Operators-Dr. Thayer Ohm End, Dr. Nanetta Batty  Impression Successful PCI and drug-eluting stenting of high-grade AV groove circumflex and first obtuse marginal branch stenosis in the setting of an occluded sequential vein graft with a patent continuation from OM1- OM 2 . The patient thought the procedure well. He will continue dual antiplatelet therapy. He'll be hydrated overnight and discharged home in the morning. I will see him back in the office in 2-3 weeks for follow-up.  Nanetta Batty. MD, [facility] 06/14/2016 9:03 AM  Findings Coronary Findings Diagnostic  Dominance: Right  Left Circumflex  Second Obtuse Marginal Branch  Saphenous Graft To 3rd Mrg SVG.  Intervention  Prox Cx to Mid Cx lesion Angioplasty A stent was successfully placed. There is a 0% residual stenosis post intervention.  Ost 2nd Mrg to 2nd Mrg lesion Angioplasty A stent was successfully placed. There is a 0% residual stenosis post intervention.   CARDIAC CATHETERIZATION  CARDIAC CATHETERIZATION 05/14/2016  Narrative Images from the original result were not included.   Ost RCA to Prox RCA lesion, 100 %stenosed.  Ost 1st Diag to 1st Diag lesion, 100 %stenosed.  Mid Cx to Dist Cx lesion, 100 %stenosed.  Ost 2nd Mrg to 2nd Mrg lesion, 90  %stenosed.  Prox Cx to Mid Cx lesion, 70 %stenosed.  Origin to Prox Graft lesion, 100 %stenosed.  Origin to Dist Graft lesion, 100 %stenosed.  LIMA.  The left ventricular systolic function is normal.  LV end diastolic pressure is normal.  The left ventricular ejection fraction is 55-65% by visual estimate.  MAYA ORIA is a 72 y.o. male   [id number] LOCATION:  FACILITY: [facility] PHYSICIAN: Nanetta Batty, M.D. November 04, 1950   DATE OF PROCEDURE:  05/14/2016  DATE OF DISCHARGE:     CARDIAC CATHETERIZATION    History obtained from chart review.The patient is a very pleasant 72 year old, thin-appearing, married Caucasian male, father of 1, grandfather to 4 grandchildren who I last saw 03/14/16. He has a history of coronary artery disease status post bypass grafting x5 by Dr. Andrey Spearman June 16, 2008, with a free LIMA to his LAD, sequential vein  02/20/2018   Procedure: ESOPHAGOGASTRODUODENOSCOPY (EGD) WITH PROPOFOL;  Surgeon: [name], MD;  Location: [facility];  Service: Endoscopy;  Laterality: N/A;   INSERTION OF MESH N/A 10/29/2017   Procedure: INSERTION OF MESH;  Surgeon: [name], MD;  Location: [facility] OR;  Service: General;  Laterality: N/A;   LUMBAR DISC SURGERY  06/17/2019   LUMBAR 2 & 3    LUMBAR LAMINECTOMY/DECOMPRESSION MICRODISCECTOMY N/A 06/17/2019   Procedure: Lumbar two -Lumbar three  decompression and disectomy;  Surgeon: [name], MD;  Location: [facility] OR;  Service: Orthopedics;  Laterality: N/A;  3 hrs   POLYPECTOMY  02/20/2018   Procedure: POLYPECTOMY;  Surgeon: [name], MD;  Location: [facility];  Service: Endoscopy;;   SHOULDER ARTHROSCOPY WITH SUBACROMIAL DECOMPRESSION AND OPEN ROTATOR C Left 09/28/2013   Procedure: LEFT SHOULDER ARTHROSCOPY WITH SUBACROMIAL DECOMPRESSION AND MINI OPEN ROTATOR CUFF REPAIR, OPEN DISTAL CLAVICLE RESECTION AND OPEN BICEP TENDODESIS ;  Surgeon: [name], MD;  Location: [facility] OR;  Service: Orthopedics;  Laterality: Left;   TRANSCAROTID ARTERY REVASCULARIZATION  Right 05/05/2021   Procedure: RIGHT TRANSCAROTID ARTERY REVASCULARIZATION;  Surgeon: [name], MD;  Location: [facility] OR;  Service: Vascular;  Laterality: Right;   UMBILICAL HERNIA REPAIR N/A 10/29/2017   Procedure: UMBILICAL HERNIA REPAIR;  Surgeon: [name], MD;  Location: [facility] OR;  Service: General;  Laterality: N/A;    Allergies  Allergies  Allergen Reactions   Crestor [Rosuvastatin Calcium] Other (See Comments)    Difficulty swallowing   Tramadol Shortness Of Breath   Lisinopril Cough   Gabapentin Other (See Comments)    Hallucinations   Other    Tricor [Fenofibrate] Other (See Comments)    UNSPECIFIED REACTION   Lipitor [Atorvastatin Calcium] Other (See Comments)    Muscle spasms     Labs/Other Studies Reviewed    The following studies were reviewed today:  Cardiac Studies & Procedures   CARDIAC CATHETERIZATION  CARDIAC CATHETERIZATION 06/14/2016  Narrative Images from the original result were not included.   Mid Cx to Dist Cx lesion, 100 %stenosed.  SVG.  Origin lesion, 100 %stenosed.  Prox Cx to Mid Cx lesion, 80 %stenosed.  Post intervention, there is a 0% residual stenosis.  A stent was successfully placed.  Ost 2nd Mrg to 2nd Mrg lesion, 95 %stenosed.  Post intervention, there is a 0% residual stenosis.  A stent was  successfully placed.  LETO BOL is a 72 y.o. male   [id number] LOCATION:  FACILITY: [facility] PHYSICIAN: Nanetta Batty, M.D. [birth year]   DATE OF PROCEDURE:  06/14/2016  DATE OF DISCHARGE:     CARDIAC CATHETERIZATION [facility]    History obtained from chart review.The patient is a very pleasant 72 year old, thin-appearing, married Caucasian male, father of 1, grandfather to 4 grandchildren who I last saw 03/14/16. He has a history of coronary artery disease status post bypass grafting x5 by Dr. Andrey Spearman June 16, 2008, with a free LIMA to his LAD, sequential vein to the first diagonal branch, first and second obtuse marginal branches, as well as a vein to the PDA. His other problems include hypertension and hyperlipidemia. He denies chest pain or shortness of breath. I did perform cerebral angiography on him May 05, 2008, because of a question of a high-grade internal carotid artery stenosis read by the radiologist. However, this turned out to be a left external carotid artery stenosis. He does have high-grade ostial left internal mammary artery stenosis which necessitated the use of a free LIMA. He is  02/20/2018   Procedure: ESOPHAGOGASTRODUODENOSCOPY (EGD) WITH PROPOFOL;  Surgeon: [name], MD;  Location: [facility];  Service: Endoscopy;  Laterality: N/A;   INSERTION OF MESH N/A 10/29/2017   Procedure: INSERTION OF MESH;  Surgeon: [name], MD;  Location: [facility] OR;  Service: General;  Laterality: N/A;   LUMBAR DISC SURGERY  06/17/2019   LUMBAR 2 & 3    LUMBAR LAMINECTOMY/DECOMPRESSION MICRODISCECTOMY N/A 06/17/2019   Procedure: Lumbar two -Lumbar three  decompression and disectomy;  Surgeon: [name], MD;  Location: [facility] OR;  Service: Orthopedics;  Laterality: N/A;  3 hrs   POLYPECTOMY  02/20/2018   Procedure: POLYPECTOMY;  Surgeon: [name], MD;  Location: [facility];  Service: Endoscopy;;   SHOULDER ARTHROSCOPY WITH SUBACROMIAL DECOMPRESSION AND OPEN ROTATOR C Left 09/28/2013   Procedure: LEFT SHOULDER ARTHROSCOPY WITH SUBACROMIAL DECOMPRESSION AND MINI OPEN ROTATOR CUFF REPAIR, OPEN DISTAL CLAVICLE RESECTION AND OPEN BICEP TENDODESIS ;  Surgeon: [name], MD;  Location: [facility] OR;  Service: Orthopedics;  Laterality: Left;   TRANSCAROTID ARTERY REVASCULARIZATION  Right 05/05/2021   Procedure: RIGHT TRANSCAROTID ARTERY REVASCULARIZATION;  Surgeon: [name], MD;  Location: [facility];  Service: Vascular;  Laterality: Right;   UMBILICAL HERNIA REPAIR N/A 10/29/2017   Procedure: UMBILICAL HERNIA REPAIR;  Surgeon: [name], MD;  Location: [facility];  Service: General;  Laterality: N/A;    Allergies  Allergies  Allergen Reactions   Crestor [Rosuvastatin Calcium] Other (See Comments)    Difficulty swallowing   Tramadol Shortness Of Breath   Lisinopril Cough   Gabapentin Other (See Comments)    Hallucinations   Other    Tricor [Fenofibrate] Other (See Comments)    UNSPECIFIED REACTION   Lipitor [Atorvastatin Calcium] Other (See Comments)    Muscle spasms     Labs/Other Studies Reviewed    The following studies were reviewed today:  Cardiac Studies & Procedures   CARDIAC CATHETERIZATION  CARDIAC CATHETERIZATION 06/14/2016  Narrative Images from the original result were not included.   Mid Cx to Dist Cx lesion, 100 %stenosed.  SVG.  Origin lesion, 100 %stenosed.  Prox Cx to Mid Cx lesion, 80 %stenosed.  Post intervention, there is a 0% residual stenosis.  A stent was successfully placed.  Ost 2nd Mrg to 2nd Mrg lesion, 95 %stenosed.  Post intervention, there is a 0% residual stenosis.  A stent was  successfully placed.  LETO BOL is a 72 y.o. male   [id number] LOCATION:  FACILITY: [facility] PHYSICIAN: Nanetta Batty, M.D. [birth year]   DATE OF PROCEDURE:  06/14/2016  DATE OF DISCHARGE:     CARDIAC CATHETERIZATION [facility]    History obtained from chart review.The patient is a very pleasant 72 year old, thin-appearing, married Caucasian male, father of 1, grandfather to 4 grandchildren who I last saw 03/14/16. He has a history of coronary artery disease status post bypass grafting x5 by Dr. Andrey Spearman June 16, 2008, with a free LIMA to his LAD, sequential vein to the first diagonal branch, first and second obtuse marginal branches, as well as a vein to the PDA. His other problems include hypertension and hyperlipidemia. He denies chest pain or shortness of breath. I did perform cerebral angiography on him May 05, 2008, because of a question of a high-grade internal carotid artery stenosis read by the radiologist. However, this turned out to be a left external carotid artery stenosis. He does have high-grade ostial left internal mammary artery stenosis which necessitated the use of a free LIMA. He is  statin intolerant. 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 A Myoview stress test performed December 11, 2011, was normal and carotid Dopplers did show a moderately severe right ICA stenosis which we have been following by duplex ultrasound. 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 Since I saw him 12 months ago he has noticed increasing dyspnea on exertion over the last several months, especially noticeable while mowing his lawn. I obtained a Myoview stress test on him based on this 03/29/16 that showed a large reversible lateral wall perfusion defect  consistent  with ischemia. He underwent outpatient cardiac catheterization by myself on 05/14/16 revealing severe native and graft vessel disease with normal LV function. Specifically, his RCA vein graft was occluded to an occluded dominant right and his circumflex vein graft which was sequential to 2 obtuse marginal branches was occluded at the aorta to patent continuation between OM1 and 2. He did have native circumflex disease. Dr. Dorris Fetch saw him during his brief hospitalization to discuss the possibility of redo coronary artery bypass grafting although the consensus was that he would be better served with attempted PCI and stenting of his native circumflex coronary artery   Operators-Dr. Thayer Ohm End, Dr. Nanetta Batty  Impression Successful PCI and drug-eluting stenting of high-grade AV groove circumflex and first obtuse marginal branch stenosis in the setting of an occluded sequential vein graft with a patent continuation from OM1- OM 2 . The patient thought the procedure well. He will continue dual antiplatelet therapy. He'll be hydrated overnight and discharged home in the morning. I will see him back in the office in 2-3 weeks for follow-up.  Nanetta Batty. MD, [facility] 06/14/2016 9:03 AM  Findings Coronary Findings Diagnostic  Dominance: Right  Left Circumflex  Second Obtuse Marginal Branch  Saphenous Graft To 3rd Mrg SVG.  Intervention  Prox Cx to Mid Cx lesion Angioplasty A stent was successfully placed. There is a 0% residual stenosis post intervention.  Ost 2nd Mrg to 2nd Mrg lesion Angioplasty A stent was successfully placed. There is a 0% residual stenosis post intervention.   CARDIAC CATHETERIZATION  CARDIAC CATHETERIZATION 05/14/2016  Narrative Images from the original result were not included.   Ost RCA to Prox RCA lesion, 100 %stenosed.  Ost 1st Diag to 1st Diag lesion, 100 %stenosed.  Mid Cx to Dist Cx lesion, 100 %stenosed.  Ost 2nd Mrg to 2nd Mrg lesion, 90  %stenosed.  Prox Cx to Mid Cx lesion, 70 %stenosed.  Origin to Prox Graft lesion, 100 %stenosed.  Origin to Dist Graft lesion, 100 %stenosed.  LIMA.  The left ventricular systolic function is normal.  LV end diastolic pressure is normal.  The left ventricular ejection fraction is 55-65% by visual estimate.  MAYA ORIA is a 72 y.o. male   [id number] LOCATION:  FACILITY: [facility] PHYSICIAN: Nanetta Batty, M.D. November 04, 1950   DATE OF PROCEDURE:  05/14/2016  DATE OF DISCHARGE:     CARDIAC CATHETERIZATION    History obtained from chart review.The patient is a very pleasant 72 year old, thin-appearing, married Caucasian male, father of 1, grandfather to 4 grandchildren who I last saw 03/14/16. He has a history of coronary artery disease status post bypass grafting x5 by Dr. Andrey Spearman June 16, 2008, with a free LIMA to his LAD, sequential vein  02/20/2018   Procedure: ESOPHAGOGASTRODUODENOSCOPY (EGD) WITH PROPOFOL;  Surgeon: [name], MD;  Location: [facility];  Service: Endoscopy;  Laterality: N/A;   INSERTION OF MESH N/A 10/29/2017   Procedure: INSERTION OF MESH;  Surgeon: [name], MD;  Location: [facility] OR;  Service: General;  Laterality: N/A;   LUMBAR DISC SURGERY  06/17/2019   LUMBAR 2 & 3    LUMBAR LAMINECTOMY/DECOMPRESSION MICRODISCECTOMY N/A 06/17/2019   Procedure: Lumbar two -Lumbar three  decompression and disectomy;  Surgeon: [name], MD;  Location: [facility] OR;  Service: Orthopedics;  Laterality: N/A;  3 hrs   POLYPECTOMY  02/20/2018   Procedure: POLYPECTOMY;  Surgeon: [name], MD;  Location: [facility];  Service: Endoscopy;;   SHOULDER ARTHROSCOPY WITH SUBACROMIAL DECOMPRESSION AND OPEN ROTATOR C Left 09/28/2013   Procedure: LEFT SHOULDER ARTHROSCOPY WITH SUBACROMIAL DECOMPRESSION AND MINI OPEN ROTATOR CUFF REPAIR, OPEN DISTAL CLAVICLE RESECTION AND OPEN BICEP TENDODESIS ;  Surgeon: [name], MD;  Location: [facility] OR;  Service: Orthopedics;  Laterality: Left;   TRANSCAROTID ARTERY REVASCULARIZATION  Right 05/05/2021   Procedure: RIGHT TRANSCAROTID ARTERY REVASCULARIZATION;  Surgeon: [name], MD;  Location: [facility] OR;  Service: Vascular;  Laterality: Right;   UMBILICAL HERNIA REPAIR N/A 10/29/2017   Procedure: UMBILICAL HERNIA REPAIR;  Surgeon: [name], MD;  Location: [facility] OR;  Service: General;  Laterality: N/A;    Allergies  Allergies  Allergen Reactions   Crestor [Rosuvastatin Calcium] Other (See Comments)    Difficulty swallowing   Tramadol Shortness Of Breath   Lisinopril Cough   Gabapentin Other (See Comments)    Hallucinations   Other    Tricor [Fenofibrate] Other (See Comments)    UNSPECIFIED REACTION   Lipitor [Atorvastatin Calcium] Other (See Comments)    Muscle spasms     Labs/Other Studies Reviewed    The following studies were reviewed today:  Cardiac Studies & Procedures   CARDIAC CATHETERIZATION  CARDIAC CATHETERIZATION 06/14/2016  Narrative Images from the original result were not included.   Mid Cx to Dist Cx lesion, 100 %stenosed.  SVG.  Origin lesion, 100 %stenosed.  Prox Cx to Mid Cx lesion, 80 %stenosed.  Post intervention, there is a 0% residual stenosis.  A stent was successfully placed.  Ost 2nd Mrg to 2nd Mrg lesion, 95 %stenosed.  Post intervention, there is a 0% residual stenosis.  A stent was  successfully placed.  LETO BOL is a 72 y.o. male   [id number] LOCATION:  FACILITY: [facility] PHYSICIAN: Nanetta Batty, M.D. [birth year]   DATE OF PROCEDURE:  06/14/2016  DATE OF DISCHARGE:     CARDIAC CATHETERIZATION [facility]    History obtained from chart review.The patient is a very pleasant 72 year old, thin-appearing, married Caucasian male, father of 1, grandfather to 4 grandchildren who I last saw 03/14/16. He has a history of coronary artery disease status post bypass grafting x5 by Dr. Andrey Spearman June 16, 2008, with a free LIMA to his LAD, sequential vein to the first diagonal branch, first and second obtuse marginal branches, as well as a vein to the PDA. His other problems include hypertension and hyperlipidemia. He denies chest pain or shortness of breath. I did perform cerebral angiography on him May 05, 2008, because of a question of a high-grade internal carotid artery stenosis read by the radiologist. However, this turned out to be a left external carotid artery stenosis. He does have high-grade ostial left internal mammary artery stenosis which necessitated the use of a free LIMA. He is  02/20/2018   Procedure: ESOPHAGOGASTRODUODENOSCOPY (EGD) WITH PROPOFOL;  Surgeon: [name], MD;  Location: [facility];  Service: Endoscopy;  Laterality: N/A;   INSERTION OF MESH N/A 10/29/2017   Procedure: INSERTION OF MESH;  Surgeon: [name], MD;  Location: [facility] OR;  Service: General;  Laterality: N/A;   LUMBAR DISC SURGERY  06/17/2019   LUMBAR 2 & 3    LUMBAR LAMINECTOMY/DECOMPRESSION MICRODISCECTOMY N/A 06/17/2019   Procedure: Lumbar two -Lumbar three  decompression and disectomy;  Surgeon: [name], MD;  Location: [facility] OR;  Service: Orthopedics;  Laterality: N/A;  3 hrs   POLYPECTOMY  02/20/2018   Procedure: POLYPECTOMY;  Surgeon: [name], MD;  Location: [facility];  Service: Endoscopy;;   SHOULDER ARTHROSCOPY WITH SUBACROMIAL DECOMPRESSION AND OPEN ROTATOR C Left 09/28/2013   Procedure: LEFT SHOULDER ARTHROSCOPY WITH SUBACROMIAL DECOMPRESSION AND MINI OPEN ROTATOR CUFF REPAIR, OPEN DISTAL CLAVICLE RESECTION AND OPEN BICEP TENDODESIS ;  Surgeon: [name], MD;  Location: [facility] OR;  Service: Orthopedics;  Laterality: Left;   TRANSCAROTID ARTERY REVASCULARIZATION  Right 05/05/2021   Procedure: RIGHT TRANSCAROTID ARTERY REVASCULARIZATION;  Surgeon: [name], MD;  Location: [facility];  Service: Vascular;  Laterality: Right;   UMBILICAL HERNIA REPAIR N/A 10/29/2017   Procedure: UMBILICAL HERNIA REPAIR;  Surgeon: [name], MD;  Location: [facility];  Service: General;  Laterality: N/A;    Allergies  Allergies  Allergen Reactions   Crestor [Rosuvastatin Calcium] Other (See Comments)    Difficulty swallowing   Tramadol Shortness Of Breath   Lisinopril Cough   Gabapentin Other (See Comments)    Hallucinations   Other    Tricor [Fenofibrate] Other (See Comments)    UNSPECIFIED REACTION   Lipitor [Atorvastatin Calcium] Other (See Comments)    Muscle spasms     Labs/Other Studies Reviewed    The following studies were reviewed today:  Cardiac Studies & Procedures   CARDIAC CATHETERIZATION  CARDIAC CATHETERIZATION 06/14/2016  Narrative Images from the original result were not included.   Mid Cx to Dist Cx lesion, 100 %stenosed.  SVG.  Origin lesion, 100 %stenosed.  Prox Cx to Mid Cx lesion, 80 %stenosed.  Post intervention, there is a 0% residual stenosis.  A stent was successfully placed.  Ost 2nd Mrg to 2nd Mrg lesion, 95 %stenosed.  Post intervention, there is a 0% residual stenosis.  A stent was  successfully placed.  LETO BOL is a 72 y.o. male   [id number] LOCATION:  FACILITY: [facility] PHYSICIAN: Nanetta Batty, M.D. [birth year]   DATE OF PROCEDURE:  06/14/2016  DATE OF DISCHARGE:     CARDIAC CATHETERIZATION [facility]    History obtained from chart review.The patient is a very pleasant 72 year old, thin-appearing, married Caucasian male, father of 1, grandfather to 4 grandchildren who I last saw 03/14/16. He has a history of coronary artery disease status post bypass grafting x5 by Dr. Andrey Spearman June 16, 2008, with a free LIMA to his LAD, sequential vein to the first diagonal branch, first and second obtuse marginal branches, as well as a vein to the PDA. His other problems include hypertension and hyperlipidemia. He denies chest pain or shortness of breath. I did perform cerebral angiography on him May 05, 2008, because of a question of a high-grade internal carotid artery stenosis read by the radiologist. However, this turned out to be a left external carotid artery stenosis. He does have high-grade ostial left internal mammary artery stenosis which necessitated the use of a free LIMA. He is  02/20/2018   Procedure: ESOPHAGOGASTRODUODENOSCOPY (EGD) WITH PROPOFOL;  Surgeon: [name], MD;  Location: [facility];  Service: Endoscopy;  Laterality: N/A;   INSERTION OF MESH N/A 10/29/2017   Procedure: INSERTION OF MESH;  Surgeon: [name], MD;  Location: [facility] OR;  Service: General;  Laterality: N/A;   LUMBAR DISC SURGERY  06/17/2019   LUMBAR 2 & 3    LUMBAR LAMINECTOMY/DECOMPRESSION MICRODISCECTOMY N/A 06/17/2019   Procedure: Lumbar two -Lumbar three  decompression and disectomy;  Surgeon: [name], MD;  Location: [facility];  Service: Orthopedics;  Laterality: N/A;  3 hrs   POLYPECTOMY  02/20/2018   Procedure: POLYPECTOMY;  Surgeon: [name], MD;  Location: [facility];  Service: Endoscopy;;   SHOULDER ARTHROSCOPY WITH SUBACROMIAL DECOMPRESSION AND OPEN ROTATOR C Left 09/28/2013   Procedure: LEFT SHOULDER ARTHROSCOPY WITH SUBACROMIAL DECOMPRESSION AND MINI OPEN ROTATOR CUFF REPAIR, OPEN DISTAL CLAVICLE RESECTION AND OPEN BICEP TENDODESIS ;  Surgeon: [name], MD;  Location: [facility] OR;  Service: Orthopedics;  Laterality: Left;   TRANSCAROTID ARTERY REVASCULARIZATION  Right 05/05/2021   Procedure: RIGHT TRANSCAROTID ARTERY REVASCULARIZATION;  Surgeon: [name], MD;  Location: [facility];  Service: Vascular;  Laterality: Right;   UMBILICAL HERNIA REPAIR N/A 10/29/2017   Procedure: UMBILICAL HERNIA REPAIR;  Surgeon: [name], MD;  Location: [facility] OR;  Service: General;  Laterality: N/A;    Allergies  Allergies  Allergen Reactions   Crestor [Rosuvastatin Calcium] Other (See Comments)    Difficulty swallowing   Tramadol Shortness Of Breath   Lisinopril Cough   Gabapentin Other (See Comments)    Hallucinations   Other    Tricor [Fenofibrate] Other (See Comments)    UNSPECIFIED REACTION   Lipitor [Atorvastatin Calcium] Other (See Comments)    Muscle spasms     Labs/Other Studies Reviewed    The following studies were reviewed today:  Cardiac Studies & Procedures   CARDIAC CATHETERIZATION  CARDIAC CATHETERIZATION 06/14/2016  Narrative Images from the original result were not included.   Mid Cx to Dist Cx lesion, 100 %stenosed.  SVG.  Origin lesion, 100 %stenosed.  Prox Cx to Mid Cx lesion, 80 %stenosed.  Post intervention, there is a 0% residual stenosis.  A stent was successfully placed.  Ost 2nd Mrg to 2nd Mrg lesion, 95 %stenosed.  Post intervention, there is a 0% residual stenosis.  A stent was  successfully placed.  LETO BOL is a 72 y.o. male   [id number] LOCATION:  FACILITY: [facility] PHYSICIAN: Nanetta Batty, M.D. [birth year]   DATE OF PROCEDURE:  06/14/2016  DATE OF DISCHARGE:     CARDIAC CATHETERIZATION [facility]    History obtained from chart review.The patient is a very pleasant 72 year old, thin-appearing, married Caucasian male, father of 1, grandfather to 4 grandchildren who I last saw 03/14/16. He has a history of coronary artery disease status post bypass grafting x5 by Dr. Andrey Spearman June 16, 2008, with a free LIMA to his LAD, sequential vein to the first diagonal branch, first and second obtuse marginal branches, as well as a vein to the PDA. His other problems include hypertension and hyperlipidemia. He denies chest pain or shortness of breath. I did perform cerebral angiography on him May 05, 2008, because of a question of a high-grade internal carotid artery stenosis read by the radiologist. However, this turned out to be a left external carotid artery stenosis. He does have high-grade ostial left internal mammary artery stenosis which necessitated the use of a free LIMA. He is  statin intolerant. 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 A Myoview stress test performed December 11, 2011, was normal and carotid Dopplers did show a moderately severe right ICA stenosis which we have been following by duplex ultrasound. 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 Since I saw him 12 months ago he has noticed increasing dyspnea on exertion over the last several months, especially noticeable while mowing his lawn. I obtained a Myoview stress test on him based on this 03/29/16 that showed a large reversible lateral wall perfusion defect  consistent  with ischemia. He underwent outpatient cardiac catheterization by myself on 05/14/16 revealing severe native and graft vessel disease with normal LV function. Specifically, his RCA vein graft was occluded to an occluded dominant right and his circumflex vein graft which was sequential to 2 obtuse marginal branches was occluded at the aorta to patent continuation between OM1 and 2. He did have native circumflex disease. Dr. Dorris Fetch saw him during his brief hospitalization to discuss the possibility of redo coronary artery bypass grafting although the consensus was that he would be better served with attempted PCI and stenting of his native circumflex coronary artery   Operators-Dr. Thayer Ohm End, Dr. Nanetta Batty  Impression Successful PCI and drug-eluting stenting of high-grade AV groove circumflex and first obtuse marginal branch stenosis in the setting of an occluded sequential vein graft with a patent continuation from OM1- OM 2 . The patient thought the procedure well. He will continue dual antiplatelet therapy. He'll be hydrated overnight and discharged home in the morning. I will see him back in the office in 2-3 weeks for follow-up.  Nanetta Batty. MD, [facility] 06/14/2016 9:03 AM  Findings Coronary Findings Diagnostic  Dominance: Right  Left Circumflex  Second Obtuse Marginal Branch  Saphenous Graft To 3rd Mrg SVG.  Intervention  Prox Cx to Mid Cx lesion Angioplasty A stent was successfully placed. There is a 0% residual stenosis post intervention.  Ost 2nd Mrg to 2nd Mrg lesion Angioplasty A stent was successfully placed. There is a 0% residual stenosis post intervention.   CARDIAC CATHETERIZATION  CARDIAC CATHETERIZATION 05/14/2016  Narrative Images from the original result were not included.   Ost RCA to Prox RCA lesion, 100 %stenosed.  Ost 1st Diag to 1st Diag lesion, 100 %stenosed.  Mid Cx to Dist Cx lesion, 100 %stenosed.  Ost 2nd Mrg to 2nd Mrg lesion, 90  %stenosed.  Prox Cx to Mid Cx lesion, 70 %stenosed.  Origin to Prox Graft lesion, 100 %stenosed.  Origin to Dist Graft lesion, 100 %stenosed.  LIMA.  The left ventricular systolic function is normal.  LV end diastolic pressure is normal.  The left ventricular ejection fraction is 55-65% by visual estimate.  MAYA ORIA is a 72 y.o. male   [id number] LOCATION:  FACILITY: [facility] PHYSICIAN: Nanetta Batty, M.D. November 04, 1950   DATE OF PROCEDURE:  05/14/2016  DATE OF DISCHARGE:     CARDIAC CATHETERIZATION    History obtained from chart review.The patient is a very pleasant 72 year old, thin-appearing, married Caucasian male, father of 1, grandfather to 4 grandchildren who I last saw 03/14/16. He has a history of coronary artery disease status post bypass grafting x5 by Dr. Andrey Spearman June 16, 2008, with a free LIMA to his LAD, sequential vein  statin intolerant. 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 A Myoview stress test performed December 11, 2011, was normal and carotid Dopplers did show a moderately severe right ICA stenosis which we have been following by duplex ultrasound. 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 Since I saw him 12 months ago he has noticed increasing dyspnea on exertion over the last several months, especially noticeable while mowing his lawn. I obtained a Myoview stress test on him based on this 03/29/16 that showed a large reversible lateral wall perfusion defect  consistent  with ischemia. He underwent outpatient cardiac catheterization by myself on 05/14/16 revealing severe native and graft vessel disease with normal LV function. Specifically, his RCA vein graft was occluded to an occluded dominant right and his circumflex vein graft which was sequential to 2 obtuse marginal branches was occluded at the aorta to patent continuation between OM1 and 2. He did have native circumflex disease. Dr. Dorris Fetch saw him during his brief hospitalization to discuss the possibility of redo coronary artery bypass grafting although the consensus was that he would be better served with attempted PCI and stenting of his native circumflex coronary artery   Operators-Dr. Thayer Ohm End, Dr. Nanetta Batty  Impression Successful PCI and drug-eluting stenting of high-grade AV groove circumflex and first obtuse marginal branch stenosis in the setting of an occluded sequential vein graft with a patent continuation from OM1- OM 2 . The patient thought the procedure well. He will continue dual antiplatelet therapy. He'll be hydrated overnight and discharged home in the morning. I will see him back in the office in 2-3 weeks for follow-up.  Nanetta Batty. MD, [facility] 06/14/2016 9:03 AM  Findings Coronary Findings Diagnostic  Dominance: Right  Left Circumflex  Second Obtuse Marginal Branch  Saphenous Graft To 3rd Mrg SVG.  Intervention  Prox Cx to Mid Cx lesion Angioplasty A stent was successfully placed. There is a 0% residual stenosis post intervention.  Ost 2nd Mrg to 2nd Mrg lesion Angioplasty A stent was successfully placed. There is a 0% residual stenosis post intervention.   CARDIAC CATHETERIZATION  CARDIAC CATHETERIZATION 05/14/2016  Narrative Images from the original result were not included.   Ost RCA to Prox RCA lesion, 100 %stenosed.  Ost 1st Diag to 1st Diag lesion, 100 %stenosed.  Mid Cx to Dist Cx lesion, 100 %stenosed.  Ost 2nd Mrg to 2nd Mrg lesion, 90  %stenosed.  Prox Cx to Mid Cx lesion, 70 %stenosed.  Origin to Prox Graft lesion, 100 %stenosed.  Origin to Dist Graft lesion, 100 %stenosed.  LIMA.  The left ventricular systolic function is normal.  LV end diastolic pressure is normal.  The left ventricular ejection fraction is 55-65% by visual estimate.  MAYA ORIA is a 72 y.o. male   [id number] LOCATION:  FACILITY: [facility] PHYSICIAN: Nanetta Batty, M.D. November 04, 1950   DATE OF PROCEDURE:  05/14/2016  DATE OF DISCHARGE:     CARDIAC CATHETERIZATION    History obtained from chart review.The patient is a very pleasant 72 year old, thin-appearing, married Caucasian male, father of 1, grandfather to 4 grandchildren who I last saw 03/14/16. He has a history of coronary artery disease status post bypass grafting x5 by Dr. Andrey Spearman June 16, 2008, with a free LIMA to his LAD, sequential vein  02/20/2018   Procedure: ESOPHAGOGASTRODUODENOSCOPY (EGD) WITH PROPOFOL;  Surgeon: [name], MD;  Location: [facility];  Service: Endoscopy;  Laterality: N/A;   INSERTION OF MESH N/A 10/29/2017   Procedure: INSERTION OF MESH;  Surgeon: [name], MD;  Location: [facility] OR;  Service: General;  Laterality: N/A;   LUMBAR DISC SURGERY  06/17/2019   LUMBAR 2 & 3    LUMBAR LAMINECTOMY/DECOMPRESSION MICRODISCECTOMY N/A 06/17/2019   Procedure: Lumbar two -Lumbar three  decompression and disectomy;  Surgeon: [name], MD;  Location: [facility] OR;  Service: Orthopedics;  Laterality: N/A;  3 hrs   POLYPECTOMY  02/20/2018   Procedure: POLYPECTOMY;  Surgeon: [name], MD;  Location: [facility];  Service: Endoscopy;;   SHOULDER ARTHROSCOPY WITH SUBACROMIAL DECOMPRESSION AND OPEN ROTATOR C Left 09/28/2013   Procedure: LEFT SHOULDER ARTHROSCOPY WITH SUBACROMIAL DECOMPRESSION AND MINI OPEN ROTATOR CUFF REPAIR, OPEN DISTAL CLAVICLE RESECTION AND OPEN BICEP TENDODESIS ;  Surgeon: [name], MD;  Location: [facility] OR;  Service: Orthopedics;  Laterality: Left;   TRANSCAROTID ARTERY REVASCULARIZATION  Right 05/05/2021   Procedure: RIGHT TRANSCAROTID ARTERY REVASCULARIZATION;  Surgeon: [name], MD;  Location: [facility] OR;  Service: Vascular;  Laterality: Right;   UMBILICAL HERNIA REPAIR N/A 10/29/2017   Procedure: UMBILICAL HERNIA REPAIR;  Surgeon: [name], MD;  Location: [facility] OR;  Service: General;  Laterality: N/A;    Allergies  Allergies  Allergen Reactions   Crestor [Rosuvastatin Calcium] Other (See Comments)    Difficulty swallowing   Tramadol Shortness Of Breath   Lisinopril Cough   Gabapentin Other (See Comments)    Hallucinations   Other    Tricor [Fenofibrate] Other (See Comments)    UNSPECIFIED REACTION   Lipitor [Atorvastatin Calcium] Other (See Comments)    Muscle spasms     Labs/Other Studies Reviewed    The following studies were reviewed today:  Cardiac Studies & Procedures   CARDIAC CATHETERIZATION  CARDIAC CATHETERIZATION 06/14/2016  Narrative Images from the original result were not included.   Mid Cx to Dist Cx lesion, 100 %stenosed.  SVG.  Origin lesion, 100 %stenosed.  Prox Cx to Mid Cx lesion, 80 %stenosed.  Post intervention, there is a 0% residual stenosis.  A stent was successfully placed.  Ost 2nd Mrg to 2nd Mrg lesion, 95 %stenosed.  Post intervention, there is a 0% residual stenosis.  A stent was  successfully placed.  LETO BOL is a 72 y.o. male   [id number] LOCATION:  FACILITY: [facility] PHYSICIAN: Nanetta Batty, M.D. [birth year]   DATE OF PROCEDURE:  06/14/2016  DATE OF DISCHARGE:     CARDIAC CATHETERIZATION [place]    History obtained from chart review.The patient is a very pleasant 72 year old, thin-appearing, married Caucasian male, father of 1, grandfather to 4 grandchildren who I last saw 03/14/16. He has a history of coronary artery disease status post bypass grafting x5 by Dr. Andrey Spearman June 16, 2008, with a free LIMA to his LAD, sequential vein to the first diagonal branch, first and second obtuse marginal branches, as well as a vein to the PDA. His other problems include hypertension and hyperlipidemia. He denies chest pain or shortness of breath. I did perform cerebral angiography on him May 05, 2008, because of a question of a high-grade internal carotid artery stenosis read by the radiologist. However, this turned out to be a left external carotid artery stenosis. He does have high-grade ostial left internal mammary artery stenosis which necessitated the use of a free LIMA. He is

## 2023-06-17 NOTE — Patient Instructions (Signed)
Medication Instructions:  Stop Amlodipine as directed Stop Metoprolol Tartrate as directed Start Metoprolol Succinate 12.5 mg daily  *If you need a refill on your cardiac medications before your next appointment, please call your pharmacy*   Lab Work: NONE ordered at this time of appointment    Testing/Procedures: NONE ordered at this time of appointment     Follow-Up: At [facility], you and your health needs are our priority.  As part of our continuing mission to provide you with exceptional heart care, we have created designated Provider Care Teams.  These Care Teams include your primary Cardiologist (physician) and Advanced Practice Providers (APPs -  Physician Assistants and Nurse Practitioners) who all work together to provide you with the care you need, when you need it.  We recommend signing up for the patient portal called "MyChart".  Sign up information is provided on this After Visit Summary.  MyChart is used to connect with patients for Virtual Visits (Telemedicine).  Patients are able to view lab/test results, encounter notes, upcoming appointments, etc.  Non-urgent messages can be sent to your provider as well.   To learn more about what you can do with MyChart, go to [email or web address].    Your next appointment:    3 months Bernadene Person NP) 6 months (Dr. Allyson Sabal)  Provider:   Nanetta Batty, MD

## 2023-06-20 ENCOUNTER — Other Ambulatory Visit (HOSPITAL_BASED_OUTPATIENT_CLINIC_OR_DEPARTMENT_OTHER): Payer: Self-pay

## 2023-06-26 ENCOUNTER — Other Ambulatory Visit (HOSPITAL_BASED_OUTPATIENT_CLINIC_OR_DEPARTMENT_OTHER): Payer: Self-pay

## 2023-06-26 MED ORDER — AMPHETAMINE-DEXTROAMPHETAMINE 20 MG PO TABS
20.0000 mg | ORAL_TABLET | Freq: Three times a day (TID) | ORAL | 0 refills | Status: DC
  Filled 2023-06-26: qty 270, 90d supply, fill #0

## 2023-07-02 ENCOUNTER — Other Ambulatory Visit (HOSPITAL_BASED_OUTPATIENT_CLINIC_OR_DEPARTMENT_OTHER): Payer: Self-pay

## 2023-07-02 MED ORDER — HYDROCODONE-ACETAMINOPHEN 5-325 MG PO TABS
1.0000 | ORAL_TABLET | Freq: Four times a day (QID) | ORAL | 0 refills | Status: DC
  Filled 2023-07-02: qty 60, 15d supply, fill #0

## 2023-07-05 ENCOUNTER — Telehealth: Payer: Self-pay | Admitting: Internal Medicine

## 2023-07-05 ENCOUNTER — Telehealth: Payer: Self-pay | Admitting: Cardiovascular Disease

## 2023-07-05 ENCOUNTER — Encounter: Payer: Self-pay | Admitting: Internal Medicine

## 2023-07-05 NOTE — Telephone Encounter (Signed)
Pre-operative Risk Assessment    Patient Name: [name]  DOB: Nov 16, 1950 MRN: [id number]  Last visit 06-20-23 Next visit 09-13-23      Request for Surgical Clearance    Procedure:   right reverse total shoulder arthroplasty  Date of Surgery:  Clearance TBD                                 Surgeon:  Dr. Malon Kindle Surgeon's Group or Practice Name:  [facility] Phone number:  [phone] Kerri-surgery schedular Fax number:  [phone]   Type of Clearance Requested:   - Medical  - Pharmacy:  Hold Aspirin Time to hold not indicated   Type of Anesthesia:   choice   Additional requests/questions:    SignedRoyann Shivers   07/05/2023, 10:16 AM

## 2023-07-05 NOTE — Telephone Encounter (Signed)
Received surgery clearance form from [facility], Dr Malon Kindle to return to  [facility] [phone] Fax [phone]  Surgical Procedure Right reverse total shoulder arthroplasty Anesthesia -- Choice Date of Surgery TBD  To be cleared by Cardiology also.  Last seen CPE / AMW 06/14/2023 Labs 06/11/23

## 2023-07-05 NOTE — Telephone Encounter (Signed)
Name: [name]  DOB: 1951/07/27  MRN: [id number]   Primary Cardiologist: Nanetta Batty, MD  Chart reviewed as part of pre-operative protocol coverage. Patient was contacted 07/05/2023 in reference to pre-operative risk assessment for pending surgery as outlined below.  [name] was last seen on 06/17/2023 by Bernadene Person, DNP, AGNP.  Since that day, [name] has done very well from a cardiac standpoint. .  Per office protocol, if patient is without any new symptoms or concerns at the time of their virtual visit, he/she may hold ASA for 7 days prior to procedure. Please resume ASA as soon as possible postprocedure, at the discretion of the surgeon.    Therefore, based on ACC/AHA guidelines, the patient would be at acceptable risk for the planned procedure without further cardiovascular testing.   The patient was advised that if he develops new symptoms prior to surgery to contact our office to arrange for a follow-up visit, and he verbalized understanding. Has follow up on Sep 13, 2023 with Bernadene Person, DNP, AGNP previously scheduled.   I will route this recommendation to the requesting party via Epic fax function and remove from pre-op pool. Please call with questions.  Joni Reining, NP 07/05/2023, 10:31 AM

## 2023-07-12 ENCOUNTER — Other Ambulatory Visit (HOSPITAL_BASED_OUTPATIENT_CLINIC_OR_DEPARTMENT_OTHER): Payer: Self-pay

## 2023-07-12 ENCOUNTER — Encounter: Payer: Self-pay | Admitting: Internal Medicine

## 2023-07-12 ENCOUNTER — Other Ambulatory Visit: Payer: Self-pay

## 2023-07-12 ENCOUNTER — Other Ambulatory Visit: Payer: Self-pay | Admitting: Internal Medicine

## 2023-07-12 MED ORDER — LEVOTHYROXINE SODIUM 100 MCG PO TABS
100.0000 ug | ORAL_TABLET | Freq: Every morning | ORAL | 1 refills | Status: DC
  Filled 2023-07-16: qty 90, 90d supply, fill #0
  Filled 2023-10-24: qty 90, 90d supply, fill #1

## 2023-07-12 MED ORDER — LEVOTHYROXINE SODIUM 100 MCG PO TABS
100.0000 ug | ORAL_TABLET | Freq: Every morning | ORAL | 0 refills | Status: DC
  Filled 2023-07-12: qty 90, 90d supply, fill #0

## 2023-07-12 MED ORDER — PANTOPRAZOLE SODIUM 40 MG PO TBEC
40.0000 mg | DELAYED_RELEASE_TABLET | Freq: Every day | ORAL | 3 refills | Status: DC
  Filled 2023-07-12: qty 90, 90d supply, fill #0
  Filled 2023-10-24: qty 90, 90d supply, fill #1
  Filled 2023-12-07 – 2024-01-27 (×2): qty 90, 90d supply, fill #2
  Filled 2024-04-27: qty 90, 90d supply, fill #3

## 2023-07-16 ENCOUNTER — Other Ambulatory Visit (HOSPITAL_BASED_OUTPATIENT_CLINIC_OR_DEPARTMENT_OTHER): Payer: Self-pay

## 2023-08-12 ENCOUNTER — Other Ambulatory Visit: Payer: Self-pay

## 2023-08-12 ENCOUNTER — Other Ambulatory Visit (HOSPITAL_BASED_OUTPATIENT_CLINIC_OR_DEPARTMENT_OTHER): Payer: Self-pay

## 2023-08-12 MED ORDER — COVID-19 MRNA VAC-TRIS(PFIZER) 30 MCG/0.3ML IM SUSY
0.3000 mL | PREFILLED_SYRINGE | Freq: Once | INTRAMUSCULAR | 0 refills | Status: AC
  Filled 2023-08-12: qty 0.3, 1d supply, fill #0

## 2023-08-12 MED ORDER — INFLUENZA VAC A&B SURF ANT ADJ 0.5 ML IM SUSY
0.5000 mL | PREFILLED_SYRINGE | Freq: Once | INTRAMUSCULAR | 0 refills | Status: AC
  Filled 2023-08-12: qty 0.5, 1d supply, fill #0

## 2023-08-12 MED ORDER — OXYCODONE-ACETAMINOPHEN 5-325 MG PO TABS
1.0000 | ORAL_TABLET | Freq: Four times a day (QID) | ORAL | 0 refills | Status: DC
  Filled 2023-08-12 (×2): qty 40, 10d supply, fill #0

## 2023-08-20 NOTE — H&P (Signed)
Patient's anticipated LOS is less than 2 midnights, meeting these requirements: - Younger than 26 - Lives within 1 hour of care - Has a competent adult at home to recover with post-op recover - NO history of  - Chronic pain requiring opiods  - Diabetes  - Coronary Artery Disease  - Heart failure  - Heart attack  - Stroke  - DVT/VTE  - Cardiac arrhythmia  - Respiratory Failure/COPD  - Renal failure  - Anemia  - Advanced Liver disease     [name] is an 72 y.o. male.    Chief Complaint: right shoulder pain  HPI: Pt is a 72 y.o. male complaining of right shoulder pain for multiple years. Pain had continually increased since the beginning. X-rays in the clinic show end-stage arthritic changes of the right shoulder. Pt has tried various conservative treatments which have failed to alleviate their symptoms, including injections and therapy. Various options are discussed with the patient. Risks, benefits and expectations were discussed with the patient. Patient understand the risks, benefits and expectations and wishes to proceed with surgery.   PCP:  Margaree Mackintosh, MD  D/C Plans: Home  PMH: Past Medical History:  Diagnosis Date   ADHD (attention deficit hyperactivity disorder)    Anxiety    Arthritis    "NECK, HANDS, KNEES" (06/14/2016)   Atypical mole 08/07/2021   Mid Back (Atypical Proliferation)   CAD (coronary artery disease)    Carotid artery stenosis    Complication of anesthesia    Reports that he had trouble swallowing post anesthesia- CABG   COPD (chronic obstructive pulmonary disease) (HCC) 12/11/2011   r/s mv - EF 72%; exercise capacity 13 METS; no exercised induced ischemic EKG changes   Depression, major    Dyspnea    with exertion   GERD (gastroesophageal reflux disease)    HTN (hypertension), benign 04/16/2008   echo - EF >55%; no regional wall or valvular abnormalities   Hyperlipidemia    statin intolerant   Hypothyroidism    Migraine     "crippling headaches as a kid; silent migraines now, no pain, couple times/week" (06/14/2016)   Peripheral vascular disease (HCC)    Prostate cancer (HCC) 04/2019   recent dx - Dr [name]   S/P angioplasty with stent 06/14/16 PCI & DES of high grade AV groove LCX and 1st OM  06/15/2016   Squamous cell cancer of tongue (HCC) ~ 2002   "35 radiation treatments at [facility]"   Subclavian steal syndrome 06/17/2012   carotid doppler - R systolic brachial pressure , L ; R subclavian artery - proxmial obsstruction w/ abnormal monophasic waveforms, R ECA known occlusive disease; L ECA narrowing w/ 70-99% diameter reductiona   TIA (transient ischemic attack) 2005   Viral hepatitis 1970s   "non specific"    PSH: Past Surgical History:  Procedure Laterality Date   BACK SURGERY     BIOPSY  02/20/2018   Procedure: BIOPSY;  Surgeon: [name], MD;  Location: [facility];  Service: Endoscopy;;   BIOPSY TONGUE  ~ 2002   "base of tongue and uvula"   CARDIAC CATHETERIZATION  04/26/2008   L circumflex 99% stenosed midportion straddling a marginal branch; RCA total priximally w/ grade 2 L to R collaterals; renal arteries widely patent; LAD 30% segmental proximal stenosis   CARDIAC CATHETERIZATION N/A 05/14/2016   Procedure: Left Heart Cath and Cors/Grafts Angiography;  Surgeon: [name], MD;  Location: [facility] INVASIVE CV LAB;  Service: Cardiovascular;  Laterality: N/A;   CARDIAC CATHETERIZATION N/A 06/14/2016   Procedure: Coronary Stent Intervention;  Surgeon: [name], MD;  [facility] INVASIVE CV LAB;  Service: Cardiovascular;  Laterality: N/A;   CERVICAL DISC SURGERY  2000   [facility]   COLONOSCOPY WITH PROPOFOL N/A 02/20/2018   Procedure: COLONOSCOPY WITH PROPOFOL;  Surgeon: [name], MD;  Location: [facility];  Service: Endoscopy;  Laterality: N/A;   CORONARY ANGIOPLASTY WITH STENT PLACEMENT     CORONARY ARTERY BYPASS GRAFT  2009   x5; LIMA to LAd, sequential vein to  first diagonal branch, first and second obtuse marginal branches; vein to PDA   ESOPHAGOGASTRODUODENOSCOPY (EGD) WITH PROPOFOL N/A 02/20/2018   Procedure: ESOPHAGOGASTRODUODENOSCOPY (EGD) WITH PROPOFOL;  Surgeon: [name], MD;  Location: [facility];  Service: Endoscopy;  Laterality: N/A;   INSERTION OF MESH N/A 10/29/2017   Procedure: INSERTION OF MESH;  Surgeon: [name], MD;  [facility] OR;  Service: General;  Laterality: N/A;   LUMBAR DISC SURGERY  06/17/2019   LUMBAR 2 & 3    LUMBAR LAMINECTOMY/DECOMPRESSION MICRODISCECTOMY N/A 06/17/2019   Procedure: Lumbar two -Lumbar three decompression and disectomy;  Surgeon: [name], MD;  Location: [facility] OR;  Service: Orthopedics;  Laterality: N/A;  3 hrs   POLYPECTOMY  02/20/2018   Procedure: POLYPECTOMY;  Surgeon: [name], MD;  Location: [facility];  Service: Endoscopy;;   SHOULDER ARTHROSCOPY WITH SUBACROMIAL DECOMPRESSION AND OPEN ROTATOR C Left 09/28/2013   Procedure: LEFT SHOULDER ARTHROSCOPY WITH SUBACROMIAL DECOMPRESSION AND MINI OPEN ROTATOR CUFF REPAIR, OPEN DISTAL CLAVICLE RESECTION AND OPEN BICEP TENDODESIS ;  Surgeon: [name], MD;  Location[facility] OR;  Service: Orthopedics;  Laterality: Left;   TRANSCAROTID ARTERY REVASCULARIZATION  Right 05/05/2021   Procedure: RIGHT TRANSCAROTID ARTERY REVASCULARIZATION;  Surgeon: [name], MD;  Location: [facility] OR;  Service: Vascular;  Laterality: Right;   UMBILICAL HERNIA REPAIR N/A 10/29/2017   Procedure: UMBILICAL HERNIA REPAIR;  Surgeon: [name], MD;  Location: [facility] OR;  Service: General;  Laterality: N/A;    Social History:  reports that he quit smoking about 22 years ago. His smoking use included cigarettes. He started smoking about 52 years ago. He has a 90 pack-year smoking history. He has never used smokeless tobacco. He reports current alcohol use of about 21.0 standard drinks of alcohol per week. He reports that he does not use drugs. BMI: Estimated body mass  index is 23.34 kg/m as calculated from the following:   Height as of 06/17/23: 5\' 7"  (1.702 m).   Weight as of 06/17/23: 67.6 kg.  Lab Results  Component Value Date   ALBUMIN 3.6 05/05/2021   Diabetes: Patient does not have a diagnosis of diabetes.     Smoking Status:      Allergies:  Allergies  Allergen Reactions   Crestor [Rosuvastatin Calcium] Other (See Comments)    Difficulty swallowing   Tramadol Shortness Of Breath   Lisinopril Cough   Gabapentin Other (See Comments)    Hallucinations   Other    Tricor [Fenofibrate] Other (See Comments)    UNSPECIFIED REACTION   Lipitor [Atorvastatin Calcium] Other (See Comments)    Muscle spasms    Medications: Current Facility-Administered Medications  Medication Dose Route Frequency Provider Last Rate Last Admin   ticagrelor (BRILINTA) tablet 90 mg  90 mg Oral BID [name], MD       Current Outpatient Medications  Medication Sig Dispense Refill   albuterol (VENTOLIN HFA) 108 (90 Base)  MCG/ACT inhaler Inhale 2 puffs into the lungs every 6 (six) hours as needed for wheezing or shortness of breath. 18 g 5   amLODipine (NORVASC) 2.5 MG tablet Take 1 tablet (2.5 mg total) by mouth daily. 90 tablet 3   amphetamine-dextroamphetamine (ADDERALL) 20 MG tablet Take 1 tablet (20 mg total) by mouth 3 (three) times daily. 270 tablet 0   amphetamine-dextroamphetamine (ADDERALL) 20 MG tablet Take 1 tablet (20 mg total) by mouth in the morning, at noon, and at bedtime. 270 tablet 0   aspirin EC 81 MG tablet Take 81 mg by mouth daily. Swallow whole.     buPROPion (WELLBUTRIN XL) 300 MG 24 hr tablet Take 1 tablet (300 mg total) by mouth every morning. 90 tablet 3   clotrimazole (LOTRIMIN AF) 1 % cream Apply 1 Application topically 2 (two) times daily. 28 g 0   COVID-19 mRNA bivalent vaccine, Pfizer, (PFIZER COVID-19 VAC BIVALENT) injection Inject into the muscle. 0.3 mL 0   COVID-19 mRNA Vac-TriS, Pfizer, (COMIRNATY) SUSP injection Inject  into the muscle. 0.3 mL 0   Evolocumab (REPATHA SURECLICK) 140 MG/ML SOAJ Inject 140 mg into the skin every 14 (fourteen) days. 6 mL 3   ferrous sulfate 325 (65 FE) MG EC tablet Take 1 tablet (325 mg total) by mouth 2 (two) times daily after a meal. Can take the treatment every other day to minimize side-effects. 90 tablet 2   fluconazole (DIFLUCAN) 150 MG tablet Take 1 tablet (150 mg total) by mouth daily for 14 days 14 tablet 0   HYDROcodone-acetaminophen (NORCO/VICODIN) 5-325 MG tablet Take 1 tablet by mouth every 6 (six) hours. 60 tablet 0   influenza vaccine adjuvanted (FLUAD QUADRIVALENT) 0.5 ML injection Inject into the muscle. 0.5 mL 0   influenza vaccine adjuvanted (FLUAD) 0.5 ML injection Inject into the muscle. 0.5 mL 0   levothyroxine (SYNTHROID) 100 MCG tablet Take 1 tablet (100 mcg total) by mouth in the morning. 90 tablet 1   LORazepam (ATIVAN) 1 MG tablet Take 1 tablet by mouth three times daily as needed (Patient not taking: Reported on 06/17/2023) 270 tablet 1   LORazepam (ATIVAN) 1 MG tablet Take 1 tablet (1 mg total) by mouth 2 to 3 times a day as needed. ( no more than 2.5 per day) 225 tablet 0   metoprolol succinate (TOPROL-XL) 25 MG 24 hr tablet Take 0.5 tablets (12.5 mg total) by mouth daily. 30 tablet 11   oxyCODONE-acetaminophen (PERCOCET) 5-325 MG tablet Take 1 tablet by mouth 4 (four) times daily as directed for 7 days 40 tablet 0   pantoprazole (PROTONIX) 40 MG tablet Take 1 tablet (40 mg total) by mouth daily. 90 tablet 3   sertraline (ZOLOFT) 100 MG tablet Take 1 tablet (100 mg total) by mouth daily. 90 tablet 4   tadalafil (CIALIS) 20 MG tablet Take 1 tablet (20 mg total) by mouth as needed for erectile dysfunction. 10 tablet 2    No results found for this or any previous visit (from the past 48 hour(s)). No results found.  ROS: Pain with rom of the right upper extremity  Physical Exam: Alert and oriented 72 y.o. male in no acute distress Cranial nerves 2-12  intact Cervical spine: full rom with no tenderness, nv intact distally Chest: active breath sounds bilaterally, no wheeze rhonchi or rales Heart: regular rate and rhythm, no murmur Abd: non tender non distended with active bowel sounds Hip is stable with rom  Right shoulder painful and weak  rom Nv intact distally No rashes or edema distally  Assessment/Plan Assessment: right shoulder cuff arthropathy  Plan:  Patient will undergo a right reverse total shoulder by Dr. Ranell Patrick at [facility] benefits and expectations were discussed with the patient. Patient understand risks, benefits and expectations and wishes to proceed. Preoperative templating of the joint replacement has been completed, documented, and submitted to the Operating Room personnel in order to optimize intra-operative equipment management.   Alphonsa Overall PA-C, MPAS [facility] is now [employer] [address], [place], [place] [address] Phone: [phone] [email or web address] Facebook  [employer]

## 2023-08-20 NOTE — Progress Notes (Signed)
Sent message, via epic in basket, requesting orders in epic from surgeon.  

## 2023-08-23 ENCOUNTER — Other Ambulatory Visit (HOSPITAL_BASED_OUTPATIENT_CLINIC_OR_DEPARTMENT_OTHER): Payer: Self-pay

## 2023-08-26 ENCOUNTER — Encounter (HOSPITAL_BASED_OUTPATIENT_CLINIC_OR_DEPARTMENT_OTHER): Payer: Self-pay

## 2023-08-26 ENCOUNTER — Other Ambulatory Visit (HOSPITAL_BASED_OUTPATIENT_CLINIC_OR_DEPARTMENT_OTHER): Payer: Self-pay

## 2023-08-26 NOTE — Patient Instructions (Signed)
SURGICAL WAITING ROOM VISITATION Patients having surgery or a procedure may have no more than 2 support people in the waiting area - these visitors may rotate in the visitor waiting room.   Due to an increase in RSV and influenza rates and associated hospitalizations, children ages 65 and under may not visit patients in [facility] hospitals. If the patient needs to stay at the hospital during part of their recovery, the visitor guidelines for inpatient rooms apply.  PRE-OP VISITATION  Pre-op nurse will coordinate an appropriate time for 1 support person to accompany the patient in pre-op.  This support person may not rotate.  This visitor will be contacted when the time is appropriate for the visitor to come back in the pre-op area.  Please refer to the [facility] website for the visitor guidelines for Inpatients (after your surgery is over and you are in a regular room).  You are not required to quarantine at this time prior to your surgery. However, you must do this: Hand Hygiene often Do NOT share personal items Notify your provider if you are in close contact with someone who has COVID or you develop fever 100.4 or greater, new onset of sneezing, cough, sore throat, shortness of breath or body aches.  If you test positive for Covid or have been in contact with anyone that has tested positive in the last 10 days please notify you surgeon.    Your procedure is scheduled on:  FRIDAY  September 06, 2023  Report to [facility] Main Entrance: [facility] entrance where the [employer] is available.   Report to admitting at: 11:30    AM  Call this number if you have any questions or problems the morning of surgery [phone]  Do not eat food after Midnight the night prior to your surgery/procedure.  After Midnight you may have the following liquids until  11:00 AM DAY OF SURGERY  Clear Liquid Diet Water Black Coffee (sugar ok, NO MILK/CREAM OR CREAMERS)  Tea (sugar ok, NO  MILK/CREAM OR CREAMERS) regular and decaf                             Plain Jell-O  with no fruit (NO RED)                                           Fruit ices (not with fruit pulp, NO RED)                                     Popsicles (NO RED)                                                                  Juice: NO CITRUS JUICES: only apple, WHITE grape, WHITE cranberry Sports drinks like Gatorade or Powerade (NO RED)                   The day of surgery:  Drink ONE (1) Pre-Surgery Clear Ensure at 11;00 AM the morning of surgery. Drink in  one sitting. Do not sip.  This drink was given to you during your hospital pre-op appointment visit. Nothing else to drink after completing the Pre-Surgery Clear Ensure : No candy, chewing gum or throat lozenges.    FOLLOW ANY ADDITIONAL PRE OP INSTRUCTIONS YOU RECEIVED FROM YOUR SURGEON'S OFFICE!!!   Oral Hygiene is also important to reduce your risk of infection.        Remember - BRUSH YOUR TEETH THE MORNING OF SURGERY WITH YOUR REGULAR TOOTHPASTE  Do NOT smoke after Midnight the night before surgery.  STOP TAKING all Vitamins, Herbs and supplements 1 week before your surgery.   STOP TAKING ASPIRIN 5-7 days before your surgery  Take ONLY these medicines the morning of surgery with A SIP OF WATER: Bupropion (Wellbutrin), sertraline (Zoloft), metoprolol if BP isn't too low, levothyroxine, pantoprazole (Protonix), You may take Tylenol if needed for pain and you may use your Albuterol inhaler.                     You may not have any metal on your body including jewelry, and body piercing  Do not wear  lotions, powders,  cologne, or deodorant  Men may shave face and neck.  Contacts, Hearing Aids, dentures or bridgework may not be worn into surgery. DENTURES WILL BE REMOVED PRIOR TO SURGERY PLEASE DO NOT APPLY "Poly grip" OR ADHESIVES!!!  You may bring a small overnight bag with you on the day of surgery, only pack items that are not valuable.  [facility] IS NOT RESPONSIBLE   FOR VALUABLES THAT ARE LOST OR STOLEN.   Do not bring your home medications to the hospital EXCEPT BRING YOUR ALBUTEROL INHALER. . The Pharmacy will dispense medications listed on your medication list to you during your admission in the Hospital.  Please read over the following fact sheets you were given: IF YOU HAVE QUESTIONS ABOUT YOUR PRE-OP INSTRUCTIONS, PLEASE CALL [phone].     Pre-operative 5 CHG Bath Instructions   You can play a key role in reducing the risk of infection after surgery. Your skin needs to be as free of germs as possible. You can reduce the number of germs on your skin by washing with CHG (chlorhexidine gluconate) soap before surgery. CHG is an antiseptic soap that kills germs and continues to kill germs even after washing.   DO NOT use if you have an allergy to chlorhexidine/CHG or antibacterial soaps. If your skin becomes reddened or irritated, stop using the CHG and notify one of our RNs at [phone]  Please shower with the CHG soap starting 4 days before surgery using the following schedule: START SHOWERS ON MONDAY  September 02, 2023  Please keep in mind the following:  DO NOT shave, including legs and underarms, starting the day of your first shower.   You may shave your face at any point before/day of surgery.   Place clean sheets on your bed the day you start using CHG soap. Use a clean washcloth (not used since being washed) for each shower. DO NOT sleep with pets once you start using the CHG.   CHG Shower Instructions:  If you choose to wash your hair and private area, wash first with your normal shampoo/soap.  After you use shampoo/soap, rinse your hair and body thoroughly to remove shampoo/soap residue.  Turn the water OFF and apply  about 3 tablespoons (45 ml) of CHG soap to a CLEAN washcloth.  Apply CHG soap ONLY FROM YOUR NECK DOWN TO YOUR TOES (washing for 3-5 minutes)  DO NOT use CHG soap on face, private areas, open wounds, or sores.  Pay special attention to the area where your surgery is being performed.  If you are having back surgery, having someone wash your back for you may be helpful.  Wait 2 minutes after CHG soap is applied, then you may rinse off the CHG soap.  Pat dry with a clean towel  Put on clean clothes/pajamas   If you choose to wear lotion, please use ONLY the CHG-compatible lotions on the back of this paper.     Additional instructions for the day of surgery: DO NOT APPLY any lotions, deodorants, cologne, or perfumes.   Put on clean/comfortable clothes.  Brush your teeth.  Ask your nurse before applying any prescription medications to the skin.      CHG Compatible Lotions   Aveeno Moisturizing lotion  Cetaphil Moisturizing Cream  Cetaphil Moisturizing Lotion  Clairol Herbal Essence Moisturizing Lotion, Dry Skin  Clairol Herbal Essence Moisturizing Lotion, Extra Dry Skin  Clairol Herbal Essence Moisturizing Lotion, Normal Skin  Curel Age Defying Therapeutic Moisturizing Lotion with Alpha Hydroxy  Curel Extreme Care Body Lotion  Curel Soothing Hands Moisturizing Hand Lotion  Curel Therapeutic Moisturizing Cream, Fragrance-Free  Curel Therapeutic Moisturizing Lotion, Fragrance-Free  Curel Therapeutic Moisturizing Lotion, Original Formula  Eucerin Daily Replenishing Lotion  Eucerin Dry Skin Therapy Plus Alpha Hydroxy Crme  Eucerin Dry Skin Therapy Plus Alpha Hydroxy Lotion  Eucerin Original Crme  Eucerin Original Lotion  Eucerin Plus Crme Eucerin Plus Lotion  Eucerin TriLipid Replenishing Lotion  Keri Anti-Bacterial Hand Lotion  Keri Deep Conditioning Original Lotion Dry Skin Formula Softly Scented  Keri Deep Conditioning Original Lotion, Fragrance Free Sensitive Skin Formula   Keri Lotion Fast Absorbing Fragrance Free Sensitive Skin Formula  Keri Lotion Fast Absorbing Softly Scented Dry Skin Formula  Keri Original Lotion  Keri Skin Renewal Lotion Keri Silky Smooth Lotion  Keri Silky Smooth Sensitive Skin Lotion  Nivea Body Creamy Conditioning Oil  Nivea Body Extra Enriched Lotion  Nivea Body Original Lotion  Nivea Body Sheer Moisturizing Lotion Nivea Crme  Nivea Skin Firming Lotion  NutraDerm 30 Skin Lotion  NutraDerm Skin Lotion  NutraDerm Therapeutic Skin Cream  NutraDerm Therapeutic Skin Lotion  ProShield Protective Hand Cream  Provon moisturizing lotion   Preparing for Total Shoulder Arthroplasty ================================================================= Please follow these instructions carefully, in addition to any other special Bathing information that was explained to you at the Presurgical Appointment:  BENZOYL PEROXIDE 5% GEL: Used to kill bacteria on the skin which could cause an infection at the surgery site.   Please do not use if you have an allergy to  benzoyl peroxide. If your skin becomes reddened/irritated stop using the benzoyl peroxide and inform your Doctor.   Starting two days before surgery, apply as follows:  1. Apply benzoyl peroxide gel in the morning and at night. Apply after taking a shower. If you are not taking a shower, clean entire shoulder front, back, and side, along with the armpit with a clean wet washcloth.  2. Place a quarter-sized dollop of the gel on your SHOULDER and rub in thoroughly, making sure to cover the front, back, and side of your shoulder, along with the armpit.   2 Days prior to Surgery    [facility]  09-04-2023 First Application _______ Morning Second Application _______ Night  Day Before Surgery     THURSDAY   09-05-2023 First Application______ Morning  On the night before surgery, wash your entire body (except hair, face and private areas) with CHG Soap. THEN, rub in the LAST application of  the Benzoyl Peroxide Gel on your shoulder.   3. On the Morning of Surgery wash your BODY AGAIN with CHG Soap (except hair, face and private areas)  4. DO NOT USE THE BENZOYL PEROXIDE GEL ON THE DAY OF YOUR SURGERY       FAILURE TO FOLLOW THESE INSTRUCTIONS MAY RESULT IN THE CANCELLATION OF YOUR SURGERY  PATIENT SIGNATURE_________________________________  NURSE SIGNATURE__________________________________  ________________________________________________________________________      Rogelia Mire    An incentive spirometer is a tool that can help keep your lungs clear and active. This tool measures how well you are filling your lungs with each breath. Taking long deep breaths may help reverse or decrease the chance of developing breathing (pulmonary) problems (especially infection) following: A long period of time when you are unable to move or be active. BEFORE THE PROCEDURE  If the spirometer includes an indicator to show your best effort, your nurse or respiratory therapist will set it to a desired goal. If possible, sit up straight or lean slightly forward. Try not to slouch. Hold the incentive spirometer in an upright position. INSTRUCTIONS FOR USE  Sit on the edge of your bed if possible, or sit up as far as you can in bed or on a chair. Hold the incentive spirometer in an upright position. Breathe out normally. Place the mouthpiece in your mouth and seal your lips tightly around it. Breathe in slowly and as deeply as possible, raising the piston or the ball toward the top of the column. Hold your breath for 3-5 seconds or for as long as possible. Allow the piston or ball to fall to the bottom of the column. Remove the mouthpiece from your mouth and breathe out normally. Rest for a few seconds and repeat Steps 1 through 7 at least 10 times every 1-2 hours when you are awake. Take your time and take a few normal breaths between deep breaths. The spirometer may  include an indicator to show your best effort. Use the indicator as a goal to work toward during each repetition. After each set of 10 deep breaths, practice coughing to be sure your lungs are clear. If you have an incision (the cut made at the time of surgery), support your incision when coughing by placing a pillow or rolled up towels firmly against it. Once you are able to get out of bed, walk around indoors and cough well. You may stop using the incentive spirometer when instructed by your caregiver.  RISKS AND COMPLICATIONS Take your time so you do not get dizzy or  light-headed. If you are in pain, you may need to take or ask for pain medication before doing incentive spirometry. It is harder to take a deep breath if you are having pain. AFTER USE Rest and breathe slowly and easily. It can be helpful to keep track of a log of your progress. Your caregiver can provide you with a simple table to help with this. If you are using the spirometer at home, follow these instructions: SEEK MEDICAL CARE IF:  You are having difficultly using the spirometer. You have trouble using the spirometer as often as instructed. Your pain medication is not giving enough relief while using the spirometer. You develop fever of 100.5 F (38.1 C) or higher.                                                                                                    SEEK IMMEDIATE MEDICAL CARE IF:  You cough up bloody sputum that had not been present before. You develop fever of 102 F (38.9 C) or greater. You develop worsening pain at or near the incision site. MAKE SURE YOU:  Understand these instructions. Will watch your condition. Will get help right away if you are not doing well or get worse. Document Released: 01/28/2007 Document Revised: 12/10/2011 Document Reviewed: 03/31/2007 [facility] Patient Information 2014 [place], [place].

## 2023-08-26 NOTE — Progress Notes (Addendum)
COVID Vaccine received:  []  No [x]  Yes Date of any COVID positive Test in last 90 days:  PCP - Luanna Cole. Lenord Fellers, MD Medical clearance scanned to Media  08-08-23 Cardiologist - Nanetta Batty, MD , Joni Reining, NP cardiac clearance note scanned to Media  08-08-23 ENT- Willeen Cass, MD at [facility] [phone] (Work) [phone] (Fax)   Chest x-ray - 05-04-2023  1v  CEW EKG - 11-28-2022  Epic  Stress Test -  ECHO -01-22-2022  Epic  Cardiac Cath - multiple LHC- last 06-14-2016 by Dr. Allyson Sabal  PCR screen: [x]  Ordered & Completed []   No Order but Needs PROFEND     []   N/A for this surgery  Surgery Plan:  []  Ambulatory   [x]  Outpatient in bed  []  Admit Anesthesia:    []  General  []  Spinal  [x]   Choice []   MAC  Pacemaker / ICD device [x]  No []  Yes   Spinal Cord Stimulator:[x]  No []  Yes       History of Sleep Apnea? [x]  No []  Yes   CPAP used?- [x]  No []  Yes    Does the patient monitor blood sugar?   [x]  N/A   []  No []  Yes  Patient has: [x]  NO Hx DM   []  Pre-DM   []  DM1  []   DM2 Last A1c was:        on       Blood Thinner / Instructions:  Brilinta???  [name]  Aspirin Instructions:  ASA 81 mg   Hold x 7 days ok'd per K. Lyman Bishop, NP clearance note.  ERAS Protocol Ordered: []  No  [x]  Yes PRE-SURGERY [x]  ENSURE  []  G2   Patient is to be NPO after: 11:00  Dental hx: []  Dentures:  []  N/A      []  Bridge or Partial:                   []  Loose or Damaged teeth:   Comments: Patient was given the 5 CHG shower / bath instructions for  Reverse Shoulder arthroplasty surgery along with 2 bottles of the CHG soap. Patient will start this on: Monday 09-02-2023 All questions were asked and answered, Patient voiced understanding of this process.   The patient was given Benzoyl peroxide Gel as ordered. Instruction regarding application starting 2 days prior to surgery was given and patient voiced understanding.   Activity level: Patient is able / unable to climb a flight of stairs without difficulty; []  No CP   []  No SOB, but would have ___   Patient can / can not perform ADLs without assistance.   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 Hx Ischemic Thalamic CVA, ADHD, COPD, HTN- labile BPs recently,   Patient denies shortness of breath, fever, cough and chest pain at PAT appointment.  Patient verbalized understanding and agreement to the Pre-Surgical Instructions that were given to them at this PAT appointment. Patient was also educated of the need to review these PAT instructions again prior to his surgery.I reviewed the appropriate phone numbers to call if they have any and questions or concerns.

## 2023-08-27 ENCOUNTER — Encounter (HOSPITAL_COMMUNITY): Payer: Self-pay

## 2023-08-27 ENCOUNTER — Other Ambulatory Visit: Payer: Self-pay

## 2023-08-27 ENCOUNTER — Other Ambulatory Visit (HOSPITAL_BASED_OUTPATIENT_CLINIC_OR_DEPARTMENT_OTHER): Payer: Self-pay

## 2023-08-27 ENCOUNTER — Encounter (HOSPITAL_COMMUNITY)
Admission: RE | Admit: 2023-08-27 | Discharge: 2023-08-27 | Disposition: A | Discharge status: Home / Self Care | Payer: Medicare Other | Source: Ambulatory Visit | Attending: Orthopedic Surgery

## 2023-08-27 VITALS — BP 92/59 | HR 60 | Temp 97.5°F | Resp 12 | Ht 67.0 in | Wt 145.0 lb

## 2023-08-27 DIAGNOSIS — Z01812 Encounter for preprocedural laboratory examination: Secondary | ICD-10-CM | POA: Diagnosis not present

## 2023-08-27 DIAGNOSIS — I10 Essential (primary) hypertension: Secondary | ICD-10-CM | POA: Insufficient documentation

## 2023-08-27 DIAGNOSIS — I2583 Coronary atherosclerosis due to lipid rich plaque: Secondary | ICD-10-CM | POA: Insufficient documentation

## 2023-08-27 DIAGNOSIS — I2510 Atherosclerotic heart disease of native coronary artery without angina pectoris: Secondary | ICD-10-CM | POA: Insufficient documentation

## 2023-08-27 DIAGNOSIS — Z01818 Encounter for other preprocedural examination: Secondary | ICD-10-CM

## 2023-08-27 HISTORY — DX: Cerebral infarction, unspecified: I63.9

## 2023-08-27 LAB — BASIC METABOLIC PANEL
Anion gap: 15 (ref 5–15)
BUN: 24 mg/dL — ABNORMAL HIGH (ref 8–23)
CO2: 27 mmol/L (ref 22–32)
Calcium: 9.7 mg/dL (ref 8.9–10.3)
Chloride: 101 mmol/L (ref 98–111)
Creatinine, Ser: 1.41 mg/dL — ABNORMAL HIGH (ref 0.61–1.24)
GFR, Estimated: 53 mL/min — ABNORMAL LOW (ref >60)
Glucose, Bld: 96 mg/dL (ref 70–99)
Potassium: 4 mmol/L (ref 3.5–5.1)
Sodium: 143 mmol/L (ref 135–145)

## 2023-08-27 LAB — CBC
HCT: 50.1 % (ref 39.0–52.0)
Hemoglobin: 17.1 g/dL — ABNORMAL HIGH (ref 13.0–17.0)
MCH: 31.5 pg (ref 26.0–34.0)
MCHC: 34.1 g/dL (ref 30.0–36.0)
MCV: 92.4 fL (ref 80.0–100.0)
Platelets: 179 10*3/uL (ref 150–400)
RBC: 5.42 MIL/uL (ref 4.22–5.81)
RDW: 14.2 % (ref 11.5–15.5)
WBC: 6.7 10*3/uL (ref 4.0–10.5)
nRBC: 0 % (ref 0.0–0.2)

## 2023-08-27 LAB — SURGICAL PCR SCREEN
MRSA, PCR: NEGATIVE
Staphylococcus aureus: NEGATIVE

## 2023-08-28 ENCOUNTER — Other Ambulatory Visit (HOSPITAL_COMMUNITY): Payer: Self-pay

## 2023-08-28 ENCOUNTER — Telehealth: Payer: Self-pay | Admitting: Pharmacy Technician

## 2023-08-28 NOTE — Telephone Encounter (Signed)
Pharmacy Patient Advocate Encounter   Received notification from Patient Advice Request messages that prior authorization for repatha is required/requested.   Insurance verification completed.   The patient is insured through [facility] .   Per test claim: PA required; PA submitted to above mentioned insurance via CoverMyMeds Key/confirmation #/EOC [id number] Status is pending

## 2023-08-28 NOTE — Telephone Encounter (Signed)
Pharmacy Patient Advocate Encounter  Received notification from [facility] that Prior Authorization for repatha has been APPROVED from 08/28/23 to 09/30/24   PA #/Case ID/Reference #: [id number]

## 2023-08-28 NOTE — Progress Notes (Signed)
Anesthesia Chart Review   Case: [id number] Date/Time: 09/06/23 0845   Procedure: REVERSE SHOULDER ARTHROPLASTY (Right: Shoulder) - interscalene  100   Anesthesia type: Choice   Pre-op diagnosis: Right shoulder rotator cuff arthropathy   Location: [facility] 06 / [facility]   Surgeons: [name], MD       DISCUSSION:72 y.o. former smoker with h/o HTN, COPD, stroke, PVD, CAD s/p CABG x 5 (LIMA-LAD, SVG-D1, SVG-OM/OM2, SVG-PDA) in 2009, DES-LCx in 2017, right shoulder rotator cuff arthropathy scheduled for above procedure 09/06/2023 with Dr. [name].   Per cardiology preoperative evaluation 07/05/2023, "Chart reviewed as part of pre-operative protocol coverage. Patient was contacted 07/05/2023 in reference to pre-operative risk assessment for pending surgery as outlined below.  ZEE VILL was last seen on 06/17/2023 by Bernadene Person, DNP, AGNP.  Since that day, BRISYN KAZEE has done very well from a cardiac standpoint. .   Per office protocol, if patient is without any new symptoms or concerns at the time of their virtual visit, he/she may hold ASA for 7 days prior to procedure. Please resume ASA as soon as possible postprocedure, at the discretion of the surgeon.     Therefore, based on ACC/AHA guidelines, the patient would be at acceptable risk for the planned procedure without further cardiovascular testing."  Clearance received from PCP which states pt is cleared from medical standpoint.   Pt with h/o oropharyngeal SCC s/p chemo, SCC of hypopharynx S/p laryngopharyngectomy, radial forearm free flap on 07/20/22.  Per last ENT visit 07/23/23 pt experiencing esophageal dysphagia, underwent dilation.  VS: BP (!) 92/59 Comment: left arm sitting,  Pt had labile BPs, orthostatic hypotension  Pulse 60   Temp (!) 36.4 C (Oral)   Resp 12   Ht 5\' 7"  (1.702 m)   Wt 65.8 kg   SpO2 96%   BMI 22.71 kg/m   PROVIDERS: Margaree Mackintosh, MD is PCP   Nanetta Batty, MD is Cardiologist  LABS:  Labs reviewed: Acceptable for surgery. (all labs ordered are listed, but only abnormal results are displayed)  Labs Reviewed  BASIC METABOLIC PANEL - Abnormal; Notable for the following components:      Result Value   BUN 24 (*)    Creatinine, Ser 1.41 (*)    GFR, Estimated 53 (*)    All other components within normal limits  CBC - Abnormal; Notable for the following components:   Hemoglobin 17.1 (*)    All other components within normal limits  SURGICAL PCR SCREEN     IMAGES:   EKG:   CV: Echo 01/22/2022 1. Left ventricular ejection fraction, by estimation, is 60 to 65%. Left  ventricular ejection fraction by 3D volume is 61 %. The left ventricle has  normal function. The left ventricle has no regional wall motion  abnormalities. There is mild concentric  left ventricular hypertrophy. Left ventricular diastolic parameters are  consistent with Grade II diastolic dysfunction (pseudonormalization).   2. Right ventricular systolic function is normal. The right ventricular  size is normal. There is normal pulmonary artery systolic pressure. The  estimated right ventricular systolic pressure is 25.3 mmHg.   3. Left atrial size was moderately dilated.   4. The mitral valve is abnormal. Mild mitral valve regurgitation.  Moderate mitral annular calcification.   5. The aortic valve has an indeterminant number of cusps. There is mild  calcification of the aortic valve. There is mild thickening of the aortic  valve. Aortic valve regurgitation is mild.  6. Aortic dilatation noted. There is borderline dilatation of the aortic  root, measuring 36 mm.   7. The inferior vena cava is normal in size with greater than 50%  respiratory variability, suggesting right atrial pressure of 3 mmHg.  Past Medical History:  Diagnosis Date   ADHD (attention deficit hyperactivity disorder)    Anxiety    Arthritis    "NECK, HANDS, KNEES" (06/14/2016)   Atypical mole 08/07/2021   Mid Back (Atypical  Proliferation)   CAD (coronary artery disease)    Carotid artery stenosis    Complication of anesthesia    Reports that he had trouble swallowing post anesthesia- CABG   COPD (chronic obstructive pulmonary disease) (HCC) 12/11/2011   r/s mv - EF 72%; exercise capacity 13 METS; no exercised induced ischemic EKG changes   Depression, major    Dyspnea    with exertion   GERD (gastroesophageal reflux disease)    HTN (hypertension), benign 04/16/2008   echo - EF >55%; no regional wall or valvular abnormalities   Hyperlipidemia    statin intolerant   Hypothyroidism    Migraine    "crippling headaches as a kid; silent migraines now, no pain, couple times/week" (06/14/2016)   Peripheral vascular disease (HCC)    Prostate cancer (HCC) 04/2019   recent dx - Dr [name]   S/P angioplasty with stent 06/14/16 PCI & DES of high grade AV groove LCX and 1st OM  06/15/2016   Squamous cell cancer of tongue (HCC) ~ 2002   "35 radiation treatments at [facility]"   Stroke [facility])    hx Thalamic Ischemic CVA   Subclavian steal syndrome 06/17/2012   carotid doppler - R systolic brachial pressure , L ; R subclavian artery - proxmial obsstruction w/ abnormal monophasic waveforms, R ECA known occlusive disease; L ECA narrowing w/ 70-99% diameter reductiona   TIA (transient ischemic attack) 2005   Viral hepatitis 1970s   "non specific"    Past Surgical History:  Procedure Laterality Date   BACK SURGERY     BIOPSY  02/20/2018   Procedure: BIOPSY;  Surgeon: [name], MD;  Location: [facility];  Service: Endoscopy;;   BIOPSY TONGUE  ~ 2002   "base of tongue and uvula"   CARDIAC CATHETERIZATION  04/26/2008   L circumflex 99% stenosed midportion straddling a marginal branch; RCA total priximally w/ grade 2 L to R collaterals; renal arteries widely patent; LAD 30% segmental proximal stenosis   CARDIAC CATHETERIZATION N/A 05/14/2016   Procedure: Left Heart Cath and Cors/Grafts Angiography;   Surgeon: [name], MD;  Location: [facility];  Service: Cardiovascular;  Laterality: N/A;   CARDIAC CATHETERIZATION N/A 06/14/2016   Procedure: Coronary Stent Intervention;  Surgeon: [name], MD;  Location: [facility];  Service: Cardiovascular;  Laterality: N/A;   CERVICAL DISC SURGERY  2000   Kritzer    Posterior approach   COLONOSCOPY WITH PROPOFOL N/A 02/20/2018   Procedure: COLONOSCOPY WITH PROPOFOL;  Surgeon: [name], MD;  Location: [facility];  Service: Endoscopy;  Laterality: N/A;   CORONARY ANGIOPLASTY WITH STENT PLACEMENT     CORONARY ARTERY BYPASS GRAFT  2009   x5; LIMA to LAd, sequential vein to first diagonal branch, first and second obtuse marginal branches; vein to PDA   ESOPHAGOGASTRODUODENOSCOPY (EGD) WITH PROPOFOL N/A 02/20/2018   Procedure: ESOPHAGOGASTRODUODENOSCOPY (EGD) WITH PROPOFOL;  Surgeon: [name], MD;  Location: [facility];  Service: Endoscopy;  Laterality: N/A;   INSERTION OF  MESH N/A 10/29/2017   Procedure: INSERTION OF MESH;  Surgeon: [name], MD;  [facility] OR;  Service: General;  Laterality: N/A;   LUMBAR DISC SURGERY  06/17/2019   LUMBAR 2 & 3    LUMBAR LAMINECTOMY/DECOMPRESSION MICRODISCECTOMY N/A 06/17/2019   Procedure: Lumbar two -Lumbar three decompression and disectomy;  Surgeon: [name], MD;  Location: [facility];  Service: Orthopedics;  Laterality: N/A;  3 hrs   POLYPECTOMY  02/20/2018   Procedure: POLYPECTOMY;  Surgeon: [name], MD;  Location: [facility];  Service: Endoscopy;;   SHOULDER ARTHROSCOPY WITH SUBACROMIAL DECOMPRESSION AND OPEN ROTATOR C Left 09/28/2013   Procedure: LEFT SHOULDER ARTHROSCOPY WITH SUBACROMIAL DECOMPRESSION AND MINI OPEN ROTATOR CUFF REPAIR, OPEN DISTAL CLAVICLE RESECTION AND OPEN BICEP TENDODESIS ;  Surgeon: [name], MD;  Location: [facility] OR;  Service: Orthopedics;  Laterality: Left;   TRANSCAROTID ARTERY REVASCULARIZATION  Right 05/05/2021   Procedure:  RIGHT TRANSCAROTID ARTERY REVASCULARIZATION;  Surgeon: [name], MD;  Location: [facility];  Service: Vascular;  Laterality: Right;   UMBILICAL HERNIA REPAIR N/A 10/29/2017   Procedure: UMBILICAL HERNIA REPAIR;  Surgeon: [name], MD;  Location: [facility];  Service: General;  Laterality: N/A;    MEDICATIONS:  acetaminophen (TYLENOL) 500 MG tablet   albuterol (VENTOLIN HFA) 108 (90 Base) MCG/ACT inhaler   amLODipine (NORVASC) 2.5 MG tablet   amphetamine-dextroamphetamine (ADDERALL) 20 MG tablet   aspirin EC 81 MG tablet   buPROPion (WELLBUTRIN XL) 300 MG 24 hr tablet   clotrimazole (LOTRIMIN AF) 1 % cream   Cyanocobalamin 2500 MCG SUBL   Evolocumab (REPATHA SURECLICK) 140 MG/ML SOAJ   levothyroxine (SYNTHROID) 100 MCG tablet   LORazepam (ATIVAN) 1 MG tablet   metoprolol succinate (TOPROL-XL) 25 MG 24 hr tablet   ondansetron (ZOFRAN-ODT) 4 MG disintegrating tablet   oxyCODONE-acetaminophen (PERCOCET) 5-325 MG tablet   pantoprazole (PROTONIX) 40 MG tablet   sertraline (ZOLOFT) 100 MG tablet   tadalafil (CIALIS) 20 MG tablet    ticagrelor (BRILINTA) tablet 90 mg

## 2023-09-02 ENCOUNTER — Other Ambulatory Visit (HOSPITAL_BASED_OUTPATIENT_CLINIC_OR_DEPARTMENT_OTHER): Payer: Self-pay

## 2023-09-04 NOTE — Anesthesia Preprocedure Evaluation (Addendum)
Anesthesia Evaluation  Patient identified by MRN, date of birth, ID band Patient awake    Reviewed: Allergy & Precautions, H&P , NPO status , Patient's Chart, lab work & pertinent test results  Airway Mallampati: II   Neck ROM: full    Dental   Pulmonary shortness of breath, COPD, former smoker   breath sounds clear to auscultation       Cardiovascular hypertension, + CAD, + Cardiac Stents, + CABG and + Peripheral Vascular Disease   Rhythm:regular Rate:Normal  Right subclavian artery stenosis   1. Left ventricular ejection fraction, by estimation, is 60 to 65%. Left  ventricular ejection fraction by 3D volume is 61 %. The left ventricle has  normal function. The left ventricle has no regional wall motion  abnormalities. There is mild concentric  left ventricular hypertrophy. Left ventricular diastolic parameters are  consistent with Grade II diastolic dysfunction (pseudonormalization).   2. Right ventricular systolic function is normal. The right ventricular  size is normal. There is normal pulmonary artery systolic pressure. The  estimated right ventricular systolic pressure is 25.3 mmHg.   3. Left atrial size was moderately dilated.   4. The mitral valve is abnormal. Mild mitral valve regurgitation.  Moderate mitral annular calcification.   5. The aortic valve has an indeterminant number of cusps. There is mild  calcification of the aortic valve. There is mild thickening of the aortic  valve. Aortic valve regurgitation is mild.   6. Aortic dilatation noted. There is borderline dilatation of the aortic  root, measuring 36 mm.   7. The inferior vena cava is normal in size with greater than 50%  respiratory variability, suggesting right atrial pressure of 3 mmHg.     Neuro/Psych  Headaches PSYCHIATRIC DISORDERS Anxiety Depression    TIA Neuromuscular disease CVA    GI/Hepatic ,GERD  ,,(+) Hepatitis -  Endo/Other   Hypothyroidism    Renal/GU negative Renal ROS  negative genitourinary   Musculoskeletal negative musculoskeletal ROS (+)    Abdominal   Peds negative pediatric ROS (+)  Hematology negative hematology ROS (+)   Anesthesia Other Findings   Reproductive/Obstetrics negative OB ROS                              Anesthesia Physical Anesthesia Plan  ASA: 4  Anesthesia Plan: General and Regional   Post-op Pain Management: Regional block*   Induction: Intravenous  PONV Risk Score and Plan: 3 and Ondansetron, Dexamethasone and Treatment may vary due to age or medical condition  Airway Management Planned: Tracheostomy  Additional Equipment: Arterial line  Intra-op Plan:   Post-operative Plan: Extubation in OR  Informed Consent: I have reviewed the patients History and Physical, chart, labs and discussed the procedure including the risks, benefits and alternatives for the proposed anesthesia with the patient or authorized representative who has indicated his/her understanding and acceptance.     Dental advisory given  Plan Discussed with: CRNA  Anesthesia Plan Comments: (See PAT note 08/27/2023)         Anesthesia Quick Evaluation

## 2023-09-06 ENCOUNTER — Ambulatory Visit (HOSPITAL_COMMUNITY)
Admission: RE | Admit: 2023-09-06 | Discharge: 2023-09-06 | Disposition: A | Discharge status: Home / Self Care | Payer: Medicare Other | Attending: Orthopedic Surgery | Admitting: Orthopedic Surgery

## 2023-09-06 ENCOUNTER — Other Ambulatory Visit: Payer: Self-pay

## 2023-09-06 ENCOUNTER — Encounter (HOSPITAL_COMMUNITY)
Admission: RE | Disposition: A | Discharge status: Home / Self Care | Payer: Self-pay | Source: Home / Self Care | Attending: Orthopedic Surgery

## 2023-09-06 ENCOUNTER — Ambulatory Visit (HOSPITAL_COMMUNITY): Payer: Medicare Other | Admitting: Physician Assistant

## 2023-09-06 ENCOUNTER — Other Ambulatory Visit (HOSPITAL_BASED_OUTPATIENT_CLINIC_OR_DEPARTMENT_OTHER): Payer: Self-pay

## 2023-09-06 ENCOUNTER — Encounter (HOSPITAL_COMMUNITY): Payer: Self-pay | Admitting: Orthopedic Surgery

## 2023-09-06 ENCOUNTER — Ambulatory Visit (HOSPITAL_COMMUNITY): Payer: Medicare Other

## 2023-09-06 DIAGNOSIS — F419 Anxiety disorder, unspecified: Secondary | ICD-10-CM | POA: Diagnosis not present

## 2023-09-06 DIAGNOSIS — I2510 Atherosclerotic heart disease of native coronary artery without angina pectoris: Secondary | ICD-10-CM | POA: Insufficient documentation

## 2023-09-06 DIAGNOSIS — I739 Peripheral vascular disease, unspecified: Secondary | ICD-10-CM | POA: Diagnosis not present

## 2023-09-06 DIAGNOSIS — Z951 Presence of aortocoronary bypass graft: Secondary | ICD-10-CM | POA: Insufficient documentation

## 2023-09-06 DIAGNOSIS — J449 Chronic obstructive pulmonary disease, unspecified: Secondary | ICD-10-CM | POA: Diagnosis not present

## 2023-09-06 DIAGNOSIS — I10 Essential (primary) hypertension: Secondary | ICD-10-CM | POA: Insufficient documentation

## 2023-09-06 DIAGNOSIS — E039 Hypothyroidism, unspecified: Secondary | ICD-10-CM | POA: Diagnosis not present

## 2023-09-06 DIAGNOSIS — M12811 Other specific arthropathies, not elsewhere classified, right shoulder: Secondary | ICD-10-CM | POA: Diagnosis not present

## 2023-09-06 DIAGNOSIS — Z87891 Personal history of nicotine dependence: Secondary | ICD-10-CM | POA: Diagnosis not present

## 2023-09-06 DIAGNOSIS — M19011 Primary osteoarthritis, right shoulder: Secondary | ICD-10-CM | POA: Diagnosis not present

## 2023-09-06 DIAGNOSIS — F32A Depression, unspecified: Secondary | ICD-10-CM | POA: Insufficient documentation

## 2023-09-06 DIAGNOSIS — Z955 Presence of coronary angioplasty implant and graft: Secondary | ICD-10-CM | POA: Diagnosis not present

## 2023-09-06 DIAGNOSIS — M75101 Unspecified rotator cuff tear or rupture of right shoulder, not specified as traumatic: Secondary | ICD-10-CM | POA: Diagnosis present

## 2023-09-06 DIAGNOSIS — Z8673 Personal history of transient ischemic attack (TIA), and cerebral infarction without residual deficits: Secondary | ICD-10-CM | POA: Diagnosis not present

## 2023-09-06 HISTORY — PX: REVERSE SHOULDER ARTHROPLASTY: SHX5054

## 2023-09-06 SURGERY — ARTHROPLASTY, SHOULDER, TOTAL, REVERSE
Anesthesia: Regional | Site: Shoulder | Laterality: Right

## 2023-09-06 MED ORDER — FENTANYL CITRATE PF 50 MCG/ML IJ SOSY
PREFILLED_SYRINGE | INTRAMUSCULAR | Status: AC
  Administered 2023-09-06: 100 ug via INTRAVENOUS
  Filled 2023-09-06: qty 2

## 2023-09-06 MED ORDER — ONDANSETRON HCL 4 MG/2ML IJ SOLN
INTRAMUSCULAR | Status: AC
  Filled 2023-09-06: qty 2

## 2023-09-06 MED ORDER — LACTATED RINGERS IV SOLN: INTRAVENOUS | Status: DC

## 2023-09-06 MED ORDER — BUPIVACAINE LIPOSOME 1.3 % IJ SUSP
INTRAMUSCULAR | Status: DC | PRN
  Administered 2023-09-06: 10 mL

## 2023-09-06 MED ORDER — FENTANYL CITRATE PF 50 MCG/ML IJ SOSY
25.0000 ug | PREFILLED_SYRINGE | INTRAMUSCULAR | Status: DC | PRN
Start: 2023-09-06 — End: 2023-09-06

## 2023-09-06 MED ORDER — FENTANYL CITRATE (PF) 100 MCG/2ML IJ SOLN
INTRAMUSCULAR | Status: AC
  Filled 2023-09-06: qty 2

## 2023-09-06 MED ORDER — METOPROLOL SUCCINATE ER 25 MG PO TB24
12.5000 mg | ORAL_TABLET | ORAL | Status: AC
  Administered 2023-09-06: 12.5 mg via ORAL
  Filled 2023-09-06: qty 1

## 2023-09-06 MED ORDER — ROCURONIUM BROMIDE 10 MG/ML (PF) SYRINGE
PREFILLED_SYRINGE | INTRAVENOUS | Status: AC
  Filled 2023-09-06: qty 10

## 2023-09-06 MED ORDER — BUPIVACAINE-EPINEPHRINE 0.25% -1:200000 IJ SOLN
INTRAMUSCULAR | Status: AC
  Filled 2023-09-06: qty 1

## 2023-09-06 MED ORDER — ROCURONIUM BROMIDE 10 MG/ML (PF) SYRINGE
PREFILLED_SYRINGE | INTRAVENOUS | Status: DC | PRN
  Administered 2023-09-06: 30 mg via INTRAVENOUS

## 2023-09-06 MED ORDER — TRANEXAMIC ACID-NACL 1000-0.7 MG/100ML-% IV SOLN
1000.0000 mg | INTRAVENOUS | Status: AC
  Administered 2023-09-06: 1000 mg via INTRAVENOUS
  Filled 2023-09-06: qty 100

## 2023-09-06 MED ORDER — OXYCODONE HCL 5 MG PO TABS: 5.0000 mg | ORAL_TABLET | Freq: Once | ORAL | Status: DC | PRN

## 2023-09-06 MED ORDER — PROPOFOL 10 MG/ML IV BOLUS
INTRAVENOUS | Status: DC | PRN
  Administered 2023-09-06: 150 mg via INTRAVENOUS

## 2023-09-06 MED ORDER — BUPIVACAINE-EPINEPHRINE (PF) 0.25% -1:200000 IJ SOLN
INTRAMUSCULAR | Status: DC | PRN
  Administered 2023-09-06: 12 mL via PERINEURAL

## 2023-09-06 MED ORDER — OXYCODONE HCL 5 MG/5ML PO SOLN: 5.0000 mg | Freq: Once | ORAL | Status: DC | PRN

## 2023-09-06 MED ORDER — EPHEDRINE SULFATE-NACL 50-0.9 MG/10ML-% IV SOSY
PREFILLED_SYRINGE | INTRAVENOUS | Status: DC | PRN
  Administered 2023-09-06: 10 mg via INTRAVENOUS
  Administered 2023-09-06 (×2): 5 mg via INTRAVENOUS

## 2023-09-06 MED ORDER — GLYCOPYRROLATE 0.2 MG/ML IJ SOLN
INTRAMUSCULAR | Status: AC
  Filled 2023-09-06: qty 1

## 2023-09-06 MED ORDER — LIDOCAINE 2% (20 MG/ML) 5 ML SYRINGE
INTRAMUSCULAR | Status: DC | PRN
  Administered 2023-09-06: 60 mg via INTRAVENOUS

## 2023-09-06 MED ORDER — CHLORHEXIDINE GLUCONATE 0.12 % MT SOLN
15.0000 mL | Freq: Once | OROMUCOSAL | Status: AC
Start: 2023-09-06 — End: 2023-09-06
  Administered 2023-09-06: 15 mL via OROMUCOSAL

## 2023-09-06 MED ORDER — FENTANYL CITRATE PF 50 MCG/ML IJ SOSY: 100.0000 ug | PREFILLED_SYRINGE | INTRAMUSCULAR | Status: DC

## 2023-09-06 MED ORDER — FENTANYL CITRATE (PF) 100 MCG/2ML IJ SOLN
INTRAMUSCULAR | Status: DC | PRN
  Administered 2023-09-06: 100 ug via INTRAVENOUS

## 2023-09-06 MED ORDER — PHENYLEPHRINE 80 MCG/ML (10ML) SYRINGE FOR IV PUSH (FOR BLOOD PRESSURE SUPPORT)
PREFILLED_SYRINGE | INTRAVENOUS | Status: AC
  Filled 2023-09-06: qty 10

## 2023-09-06 MED ORDER — PHENYLEPHRINE 80 MCG/ML (10ML) SYRINGE FOR IV PUSH (FOR BLOOD PRESSURE SUPPORT)
PREFILLED_SYRINGE | INTRAVENOUS | Status: DC | PRN
  Administered 2023-09-06: 160 ug via INTRAVENOUS

## 2023-09-06 MED ORDER — 0.9 % SODIUM CHLORIDE (POUR BTL) OPTIME
TOPICAL | Status: DC | PRN
  Administered 2023-09-06: 1000 mL

## 2023-09-06 MED ORDER — SUGAMMADEX SODIUM 200 MG/2ML IV SOLN
INTRAVENOUS | Status: DC | PRN
  Administered 2023-09-06: 200 mg via INTRAVENOUS

## 2023-09-06 MED ORDER — STERILE WATER FOR IRRIGATION IR SOLN
Status: DC | PRN
  Administered 2023-09-06: 1000 mL

## 2023-09-06 MED ORDER — BUPIVACAINE HCL (PF) 0.5 % IJ SOLN
INTRAMUSCULAR | Status: DC | PRN
  Administered 2023-09-06: 10 mL

## 2023-09-06 MED ORDER — LIDOCAINE HCL (PF) 2 % IJ SOLN
INTRAMUSCULAR | Status: AC
  Filled 2023-09-06: qty 5

## 2023-09-06 MED ORDER — EPHEDRINE 5 MG/ML INJ
INTRAVENOUS | Status: AC
  Filled 2023-09-06: qty 5

## 2023-09-06 MED ORDER — DROPERIDOL 2.5 MG/ML IJ SOLN: 0.6250 mg | Freq: Once | INTRAMUSCULAR | Status: DC | PRN

## 2023-09-06 MED ORDER — PHENYLEPHRINE HCL-NACL 20-0.9 MG/250ML-% IV SOLN
INTRAVENOUS | Status: DC | PRN
  Administered 2023-09-06: 35 ug/min via INTRAVENOUS

## 2023-09-06 MED ORDER — MIDAZOLAM HCL 2 MG/2ML IJ SOLN
INTRAMUSCULAR | Status: AC
  Administered 2023-09-06: 2 mg via INTRAVENOUS
  Filled 2023-09-06: qty 2

## 2023-09-06 MED ORDER — DEXAMETHASONE SODIUM PHOSPHATE 10 MG/ML IJ SOLN
INTRAMUSCULAR | Status: DC | PRN
  Administered 2023-09-06: 10 mg via INTRAVENOUS

## 2023-09-06 MED ORDER — ORAL CARE MOUTH RINSE: 15.0000 mL | Freq: Once | OROMUCOSAL | Status: AC

## 2023-09-06 MED ORDER — PROPOFOL 10 MG/ML IV BOLUS
INTRAVENOUS | Status: AC
  Filled 2023-09-06: qty 20

## 2023-09-06 MED ORDER — DEXAMETHASONE SODIUM PHOSPHATE 10 MG/ML IJ SOLN
INTRAMUSCULAR | Status: AC
  Filled 2023-09-06: qty 1

## 2023-09-06 MED ORDER — CEFAZOLIN SODIUM-DEXTROSE 2-4 GM/100ML-% IV SOLN
2.0000 g | INTRAVENOUS | Status: AC
  Administered 2023-09-06: 2 g via INTRAVENOUS
  Filled 2023-09-06: qty 100

## 2023-09-06 MED ORDER — TIZANIDINE HCL 4 MG PO TABS
4.0000 mg | ORAL_TABLET | Freq: Three times a day (TID) | ORAL | 0 refills | Status: DC | PRN
  Filled 2023-09-06: qty 30, 10d supply, fill #0

## 2023-09-06 MED ORDER — ACETAMINOPHEN 10 MG/ML IV SOLN: 1000.0000 mg | Freq: Once | INTRAVENOUS | Status: DC | PRN

## 2023-09-06 MED ORDER — MIDAZOLAM HCL 2 MG/2ML IJ SOLN: 2.0000 mg | INTRAMUSCULAR | Status: DC

## 2023-09-06 MED ORDER — OXYCODONE-ACETAMINOPHEN 5-325 MG PO TABS
1.0000 | ORAL_TABLET | ORAL | 0 refills | Status: DC | PRN
  Filled 2023-09-06: qty 30, 5d supply, fill #0

## 2023-09-06 MED ORDER — ONDANSETRON HCL 4 MG/2ML IJ SOLN
INTRAMUSCULAR | Status: DC | PRN
  Administered 2023-09-06: 4 mg via INTRAVENOUS

## 2023-09-06 SURGICAL SUPPLY — 62 items
BAG COUNTER SPONGE SURGICOUNT (BAG) IMPLANT
BAG ZIPLOCK 12X15 (MISCELLANEOUS) IMPLANT
BIT DRILL 1.6MX128 (BIT) IMPLANT
BIT DRILL 170X2.5X (BIT) IMPLANT
BIT DRL 170X2.5X (BIT) ×1
BLADE SAG 18X100X1.27 (BLADE) ×1 IMPLANT
COVER BACK TABLE 60X90IN (DRAPES) ×1 IMPLANT
COVER SURGICAL LIGHT HANDLE (MISCELLANEOUS) ×1 IMPLANT
CUP HUMERAL 42 PLUS 3 (Orthopedic Implant) IMPLANT
DERMABOND ADVANCED .7 DNX12 (GAUZE/BANDAGES/DRESSINGS) IMPLANT
DRAPE INCISE IOBAN 66X45 STRL (DRAPES) ×1 IMPLANT
DRAPE SHEET LG 3/4 BI-LAMINATE (DRAPES) ×1 IMPLANT
DRAPE SURG ORHT 6 SPLT 77X108 (DRAPES) ×2 IMPLANT
DRAPE TOP 10253 STERILE (DRAPES) ×1 IMPLANT
DRAPE U-SHAPE 47X51 STRL (DRAPES) ×1 IMPLANT
DRSG ADAPTIC 3X8 NADH LF (GAUZE/BANDAGES/DRESSINGS) ×1 IMPLANT
DRSG AQUACEL AG ADV 3.5X10 (GAUZE/BANDAGES/DRESSINGS) IMPLANT
DURAPREP 26ML APPLICATOR (WOUND CARE) ×1 IMPLANT
ELECT BLADE TIP CTD 4 INCH (ELECTRODE) ×1 IMPLANT
ELECT NDL TIP 2.8 STRL (NEEDLE) ×1 IMPLANT
ELECT NEEDLE TIP 2.8 STRL (NEEDLE) ×1
ELECT REM PT RETURN 15FT ADLT (MISCELLANEOUS) ×1 IMPLANT
EPIPHYSI RIGHT SZ 2 (Shoulder) ×1 IMPLANT
EPIPHYSIS RIGHT SZ 2 (Shoulder) IMPLANT
FACESHIELD WRAPAROUND (MASK) ×1
FACESHIELD WRAPAROUND OR TEAM (MASK) ×1 IMPLANT
GAUZE PAD ABD 8X10 STRL (GAUZE/BANDAGES/DRESSINGS) ×1 IMPLANT
GAUZE SPONGE 4X4 12PLY STRL (GAUZE/BANDAGES/DRESSINGS) ×1 IMPLANT
GLENOSPHERE XTEND LAT 42+0 STD (Miscellaneous) IMPLANT
GLOVE BIOGEL PI IND STRL 7.5 (GLOVE) ×1 IMPLANT
GLOVE BIOGEL PI IND STRL 8.5 (GLOVE) ×1 IMPLANT
GLOVE ORTHO TXT STRL SZ7.5 (GLOVE) ×1 IMPLANT
GLOVE SURG ORTHO 8.5 STRL (GLOVE) ×1 IMPLANT
GOWN STRL REUS W/ TWL XL LVL3 (GOWN DISPOSABLE) ×2 IMPLANT
KIT BASIN OR (CUSTOM PROCEDURE TRAY) ×1 IMPLANT
KIT TURNOVER KIT A (KITS) IMPLANT
MANIFOLD NEPTUNE II (INSTRUMENTS) ×1 IMPLANT
METAGLENE DELTA EXTEND (Trauma) IMPLANT
METAGLENE DXTEND (Trauma) ×1 IMPLANT
NDL MAYO CATGUT SZ4 TPR NDL (NEEDLE) IMPLANT
NEEDLE MAYO CATGUT SZ4 (NEEDLE)
NS IRRIG 1000ML POUR BTL (IV SOLUTION) ×1 IMPLANT
PACK SHOULDER (CUSTOM PROCEDURE TRAY) ×1 IMPLANT
PIN GUIDE 1.2 (PIN) IMPLANT
PIN GUIDE GLENOPHERE 1.5MX300M (PIN) IMPLANT
PIN METAGLENE 2.5 (PIN) IMPLANT
RESTRAINT HEAD UNIVERSAL NS (MISCELLANEOUS) ×1 IMPLANT
SCREW 4.5X36MM (Screw) IMPLANT
SCREW BN 24X4.5XLCK STRL (Screw) IMPLANT
SCREW LOCK 42 (Screw) IMPLANT
SLING ARM FOAM STRAP LRG (SOFTGOODS) IMPLANT
SPIKE FLUID TRANSFER (MISCELLANEOUS) ×1 IMPLANT
SPONGE T-LAP 4X18 ~~LOC~~+RFID (SPONGE) IMPLANT
STEM DELTA DIA 10 HA (Stem) IMPLANT
STRIP CLOSURE SKIN 1/2X4 (GAUZE/BANDAGES/DRESSINGS) ×1 IMPLANT
SUT FIBERWIRE #2 38 T-5 BLUE (SUTURE) ×1
SUT MNCRL AB 4-0 PS2 18 (SUTURE) ×1 IMPLANT
SUT VIC AB 0 CT1 36 (SUTURE) ×1 IMPLANT
SUT VIC AB 0 CT2 27 (SUTURE) ×1 IMPLANT
SUT VIC AB 2-0 CT1 TAPERPNT 27 (SUTURE) ×1 IMPLANT
SUTURE FIBERWR #2 38 T-5 BLUE (SUTURE) ×1 IMPLANT
TOWEL OR 17X26 10 PK STRL BLUE (TOWEL DISPOSABLE) ×1 IMPLANT

## 2023-09-06 NOTE — Interval H&P Note (Signed)
History and Physical Interval Note:  09/06/2023 7:23 AM  [name]  has presented today for surgery, with the diagnosis of Right shoulder rotator cuff arthropathy.  The various methods of treatment have been discussed with the patient and family. After consideration of risks, benefits and other options for treatment, the patient has consented to  Procedure(s) with comments: REVERSE SHOULDER ARTHROPLASTY (Right) - interscalene  100 as a surgical intervention.  The patient's history has been reviewed, patient examined, no change in status, stable for surgery.  I have reviewed the patient's chart and labs.  Questions were answered to the patient's satisfaction.     [name]

## 2023-09-06 NOTE — Op Note (Signed)
NAME: DORRIAN, THORNES MEDICAL RECORD NO: [id number] ACCOUNT NO: [id number] DATE OF BIRTH: May 18, 1951 FACILITY: [facility] LOCATION: [facility] PHYSICIAN: Almedia Balls. Ranell Patrick, MD  Operative Report   DATE OF PROCEDURE: 09/06/2023  PREOPERATIVE DIAGNOSIS:  Right shoulder rotator cuff tear arthropathy.  POSTOPERATIVE DIAGNOSIS:  Right shoulder rotator cuff tear arthropathy.  PROCEDURE PERFORMED:  Right reverse total shoulder arthroplasty using DePuy Delta Xtend prosthesis without subscapularis repair.  ATTENDING SURGEON:  Almedia Balls. Ranell Patrick, MD  ASSISTANT:  Konrad Felix Dixon, [place], who was scrubbed during the entire procedure, and necessary for satisfactory completion of surgery.  ANESTHESIA:  General anesthesia was used plus interscalene block.  ESTIMATED BLOOD LOSS:  150 mL.  FLUID REPLACEMENT:  1500 mL crystalloid.  COUNTS:  Instrument count was correct.  COMPLICATIONS:  There were no complications.  ANTIBIOTICS:  Perioperative antibiotics were given.  INDICATIONS:  The patient is a 72 year old male who presents with a history of worsening right shoulder pain secondary to end-stage rotator cuff tear arthropathy.  The patient has failed conservative management and desires operative treatment to  eliminate pain and to restore function.  Informed consent was obtained.  DESCRIPTION OF PROCEDURE:  After an adequate level of general anesthesia was achieved plus interscalene block, the patient was positioned in the modified beach chair position.  The right shoulder was correctly identified and sterile prep and drape were  performed.  Timeout called verifying correct patient and correct site.  We entered the patient's shoulder using a standard deltopectoral approach starting at the coracoid process and extending down to the anterior humerus.  Dissection down through  subcutaneous tissues using Bovie.  The cephalic vein was identified and taken laterally at the deltoid.  Pectoralis was taken medially.   Conjoined tendon was identified and retracted medially.  Deep retractor was placed.  We did an in situ tenodesis with  0 Vicryl figure-of-eight suture x2.  We then released the subscapularis remnant, which was not repairable.  We tagged it for protection of the axillary nerve.  We released the inferior capsule extending the shoulder and delivering the humeral head out  of the wound.  There was no cartilage on the top of the humerus and there was no significant rotator cuff until we got to the teres minor.  We preserved that.  At this point, we entered the proximal humerus with a 6 mm reamer.  We reamed up to a size 10  mm.  We then placed our 10 mm T-handle guide and resected the head at 20 degrees of retroversion with the oscillating saw.  We irrigated thoroughly, removed excess osteophytes with a rongeur.  We then subluxed the humerus posteriorly, gaining good  exposure of the glenoid.  We placed our deep retractors, removed the capsule and the labrum and the remaining cartilage.  We then found our center point for a guide pin and placed the guide pin centered inferiorly angled slightly.  We reamed to the  subchondral bone on the glenoid for the metaglene baseplate.  We then did our peripheral hand reaming with a T-handle reamer and we did our central peg hole with a drill.  We then impacted the HA-coated press fit baseplate into position.  We placed a 42  screw inferiorly, a 36 screw superiorly, and a 24 screw anteriorly.  We locked all those screws.  We had good baseplate, bony support and stability.  We then selected a 42+0 standard glenosphere and attached that to the baseplate with a screwdriver.  I  did a finger  sweep to make sure we had no soft tissue caught up in the bearing.  Next, we went back to the humeral side and reamed for the two right metaphysis.  We then trialed with a 10 stem and the two right metaphysis on the 0 setting, placed in 20  degrees of retroversion.  We reduced with a 42+3  poly trial.  We were happy with her soft tissue balancing and stability.  We removed all trial components from the humeral side.  We irrigated thoroughly and we then selected the real components, which  were the HA-coated 10 stem and the HA-coated 2 right metaphysis set in the 0 setting.  We then used available bone graft from the humeral head and impaction grafting technique and impacted the HA-coated component into place on the humerus in 20 degrees  of retroversion.  We had a nice stable stem.  We then selected the real 42+3 poly, impacted down the humeral tray, and reduced the shoulder.  We had a nice little pop as the shoulder reduced, nice and stable throughout a full range of motion.  We then  irrigated thoroughly, resected the subscapularis remnant.  We then repaired the deltopectoral interval with 0 Vicryl suture followed by 2-0 Vicryl for subcutaneous closure and 4-0 Monocryl for skin.  Steri-Strips were applied followed by a sterile  dressing.  The patient tolerated the surgery well.   PUS D: 09/06/2023 11:16:15 am T: 09/06/2023 11:48:00 am  JOB: [id number]/ [id number]

## 2023-09-06 NOTE — Discharge Instructions (Signed)
Ice to the shoulder constantly.  Keep the incision covered and clean and dry for one week, then ok to get it wet in the shower. Please change to the Aquacel bandage on Monday. Leave covered for one week then uncover and ok to get wet.  Do exercise as instructed several times per day.  DO NOT reach behind your back or push up out of a chair with the operative arm.  Use a sling while you are up and around for comfort, may remove while seated.  Keep pillow propped behind the operative elbow.  Follow up with Dr Ranell Patrick in two weeks in the office, call [phone] for appt  Please call Dr Ranell Patrick (cell) [phone] with any questions or concerns

## 2023-09-06 NOTE — Anesthesia Postprocedure Evaluation (Addendum)
Anesthesia Post Note  Patient: [name]  Procedure(s) Performed: REVERSE SHOULDER ARTHROPLASTY (Right: Shoulder)     Patient location during evaluation: PACU Anesthesia Type: Regional and General Level of consciousness: awake and alert Pain management: pain level controlled Vital Signs Assessment: post-procedure vital signs reviewed and stable Respiratory status: spontaneous breathing, nonlabored ventilation, respiratory function stable and patient connected to nasal cannula oxygen Cardiovascular status: blood pressure returned to baseline and stable Postop Assessment: no apparent nausea or vomiting Anesthetic complications: no   No notable events documented.  Last Vitals:  Vitals:   09/06/23 1130 09/06/23 1145  BP: (!) 164/82 (!) 153/76  Pulse: 66 65  Resp: 16   Temp:    SpO2: 95%     Last Pain:  Vitals:   09/06/23 0825  TempSrc:   PainSc: 2                  [employer]

## 2023-09-06 NOTE — Brief Op Note (Signed)
09/06/2023  11:11 AM  PATIENT:  [name]  72 y.o. male  PRE-OPERATIVE DIAGNOSIS:  Right shoulder rotator cuff arthropathy  POST-OPERATIVE DIAGNOSIS:  Right shoulder rotator cuff arthropathy  PROCEDURE:  Procedure(s) with comments: REVERSE SHOULDER ARTHROPLASTY (Right) - interscalene  100 DePuy Delta Xtend with NO subscap repair SURGEON:  Surgeons and Role:    [name], MD - Primary  PHYSICIAN ASSISTANT:   ASSISTANTS: Thea Gist, PA-C   ANESTHESIA:   regional and general  EBL:  150 mL   BLOOD ADMINISTERED:none  DRAINS: none   LOCAL MEDICATIONS USED:  MARCAINE     SPECIMEN:  No Specimen  DISPOSITION OF SPECIMEN:  N/A  COUNTS:  YES  TOURNIQUET:  * No tourniquets in log *  DICTATION: .Other Dictation: Dictation Number [id number]  PLAN OF CARE: Discharge to home after PACU  PATIENT DISPOSITION:  PACU - hemodynamically stable.   Delay start of Pharmacological VTE agent (>24hrs) due to surgical blood loss or risk of bleeding: not applicable

## 2023-09-06 NOTE — Anesthesia Procedure Notes (Signed)
Procedure Name: Intubation Date/Time: 09/06/2023 9:30 AM  Performed by: Orest Dikes, CRNAPre-anesthesia Checklist: Patient identified, Emergency Drugs available, Suction available and Patient being monitored Patient Re-evaluated:Patient Re-evaluated prior to induction Oxygen Delivery Method: Circle system utilized Preoxygenation: Pre-oxygenation with 100% oxygen Induction Type: IV induction Tube type: Reinforced Tube size: 6.0 mm Number of attempts: 1 Placement Confirmation: positive ETCO2 and breath sounds checked- equal and bilateral Tube secured with: Tape Comments: Reinforced 6.0 ETT gently placed through stoma and secured well with tape after position confirmed.

## 2023-09-06 NOTE — Evaluation (Signed)
Occupational Therapy Evaluation Patient Details Name: [name] MRN: [id number] DOB: November 07, 1950 Today's Date: 09/06/2023   History of Present Illness 72 y.o. male s/p R reverse TSA completed on 09/06/23 by Dr. Ranell Patrick.   Clinical Impression   s/p shoulder replacement without functional use of RUE secondary to effects of surgery and interscalene block and shoulder precautions. Therapist provided education and instruction to patient and wife in regards to exercises, precautions, positioning, donning upper extremity clothing and bathing while maintaining shoulder precautions, ice and edema management, and donning/doffing sling. Patient and wife verbalized understanding and demonstrated as needed. Patient needed assistance to donn shirt, underwear, pants, socks and shoes and provided with instruction on compensatory strategies to perform ADLs. Patient to follow up with MD for further therapy needs.         If plan is discharge home, recommend the following: A little help with bathing/dressing/bathroom;Assist for transportation    Functional Status Assessment  Patient has had a recent decline in their functional status and demonstrates the ability to make significant improvements in function in a reasonable and predictable amount of time.  Equipment Recommendations  None recommended by OT       Precautions / Restrictions Precautions Precautions: Shoulder Type of Shoulder Precautions: post op shoulder restrictions: WB restrictions, No A/ROM shoulder, OK A/ROM elbow, wrist, and hand, sling 24/7 Shoulder Interventions: Shoulder sling/immobilizer;At all times Precaution Comments: No puncture or BP to R arm per pt's chart. Verbal education provided to patient and wife regarding post op shoulder restrictions including sling management/wearing schedule, ROM restrictions, WB status, HEP, and edema management. Required Braces or Orthoses: Sling Restrictions Weight Bearing Restrictions: Yes RUE  Weight Bearing: Non weight bearing Other Position/Activity Restrictions: 72 y.o. male s/p Right reverse TSA completed on 09/06/23  by Dr. Ranell Patrick.      Mobility Bed Mobility    General bed mobility comments: N/A    Transfers Overall transfer level: Independent Equipment used: None         Balance Overall balance assessment: Independent                ADL either performed or assessed with clinical judgement   ADL     General ADL Comments: Due to recent shoulder surgery, pt requires Min-Mod A for BADL tasks. Wife is able to assist at home.     Vision Baseline Vision/History: 1 Wears glasses Ability to See in Adequate Light: 0 Adequate Patient Visual Report: No change from baseline Vision Assessment?: No apparent visual deficits     Perception Perception: Not tested       Praxis Praxis: Not tested       Pertinent Vitals/Pain Pain Assessment Pain Assessment: No/denies pain (nerve block still in effect)     Extremity/Trunk Assessment Upper Extremity Assessment Upper Extremity Assessment: Left hand dominant;RUE deficits/detail RUE Deficits / Details: post op shoulder restrictions. nerve block still in effect. Able to demonstrate active finger and hand ROM only due to nerve block. RUE: Unable to fully assess due to immobilization RUE Sensation: decreased light touch RUE Coordination: decreased fine motor;decreased gross motor   Lower Extremity Assessment Lower Extremity Assessment: Overall WFL for tasks assessed   Cervical / Trunk Assessment Cervical / Trunk Assessment: Normal   Communication Communication Communication: No apparent difficulties   Cognition Arousal: Alert Behavior During Therapy: WFL for tasks assessed/performed Overall Cognitive Status: Within Functional Limits for tasks assessed            General Comments  post op surgical dressing  intact on right shoulder. Post op swelling present.            Home Living Family/patient  expects to be discharged to:: Private residence Living Arrangements: Spouse/significant other                 Prior Functioning/Environment Prior Level of Function : Independent/Modified Independent         OT Problem List: Impaired UE functional use         OT Goals(Current goals can be found in the care plan section) Acute Rehab OT Goals Patient Stated Goal: to go home OT Goal Formulation: All assessment and education complete, DC therapy  OT Frequency:  1X visit       AM-PAC OT "6 Clicks" Daily Activity     Outcome Measure Help from another person eating meals?: A Little Help from another person taking care of personal grooming?: A Little Help from another person toileting, which includes using toliet, bedpan, or urinal?: A Little Help from another person bathing (including washing, rinsing, drying)?: A Little Help from another person to put on and taking off regular upper body clothing?: Total Help from another person to put on and taking off regular lower body clothing?: A Lot 6 Click Score: 15   End of Session Equipment Utilized During Treatment: Other (comment) (sling) Nurse Communication: Mobility status;Other (comment) (ready for discharge from therapy standpoint)  Activity Tolerance: Patient tolerated treatment well Patient left: in chair;with family/visitor present  OT Visit Diagnosis: Muscle weakness (generalized) (M62.81)                Time: 6213-0865 OT Time Calculation (min): 17 min Charges:  OT General Charges $OT Visit: 1 Visit OT Evaluation $OT Eval High Complexity: 1 High  [employer], OTR/L,CBIS  Supplemental OT - [facility] and [facility] Secure Chat Preferred    Aryan Sparks, Charisse March 09/06/2023, 2:14 PM

## 2023-09-06 NOTE — Anesthesia Procedure Notes (Signed)
Anesthesia Regional Block: Interscalene brachial plexus block   Pre-Anesthetic Checklist: , timeout performed,  Correct Patient, Correct Site, Correct Laterality,  Correct Procedure, Correct Position, site marked,  Risks and benefits discussed,  Surgical consent,  Pre-op evaluation,  At surgeon's request and post-op pain management  Laterality: Right  Prep: chloraprep       Needles:  Injection technique: Single-shot  Needle Type: Echogenic Stimulator Needle     Needle Length: 9cm  Needle Gauge: 21     Additional Needles:   Procedures:,,,, ultrasound used (permanent image in chart),,    Narrative:  Start time: 09/06/2023 8:10 AM End time: 09/06/2023 8:15 AM Injection made incrementally with aspirations every 5 mL.  Performed by: Personally  Anesthesiologist:  Nation, MD  Additional Notes: Discussed risks and benefits of the nerve block in detail, including but not limited vascular injury, permanent nerve damage and infection.   Patient tolerated the procedure well. Local anesthetic introduced in an incremental fashion under minimal resistance after negative aspirations. No paresthesias were elicited. After completion of the procedure, no acute issues were identified and patient continued to be monitored by RN.

## 2023-09-06 NOTE — Transfer of Care (Signed)
Immediate Anesthesia Transfer of Care Note  Patient: [name]  Procedure(s) Performed: REVERSE SHOULDER ARTHROPLASTY (Right: Shoulder)  Patient Location: PACU  Anesthesia Type:General  Level of Consciousness: drowsy  Airway & Oxygen Therapy: Patient Spontanous Breathing and Patient connected to face mask oxygen  Post-op Assessment: Report given to RN and Post -op Vital signs reviewed and stable  Post vital signs: Reviewed and stable  Last Vitals:  Vitals Value Taken Time  BP 164/78 09/06/23 1120  Temp    Pulse 64 09/06/23 1123  Resp 11 09/06/23 1123  SpO2 96 % 09/06/23 1123  Vitals shown include unfiled device data.  Last Pain:  Vitals:   09/06/23 0825  TempSrc:   PainSc: 2          Complications: No notable events documented.

## 2023-09-09 ENCOUNTER — Encounter (HOSPITAL_COMMUNITY): Payer: Self-pay | Admitting: Orthopedic Surgery

## 2023-09-13 ENCOUNTER — Other Ambulatory Visit (HOSPITAL_BASED_OUTPATIENT_CLINIC_OR_DEPARTMENT_OTHER): Payer: Self-pay

## 2023-09-13 ENCOUNTER — Encounter: Payer: Self-pay | Admitting: Nurse Practitioner

## 2023-09-13 ENCOUNTER — Ambulatory Visit: Payer: Medicare Other | Attending: Nurse Practitioner | Admitting: Nurse Practitioner

## 2023-09-13 VITALS — BP 116/64 | HR 61 | Ht 67.0 in | Wt 149.2 lb

## 2023-09-13 DIAGNOSIS — E785 Hyperlipidemia, unspecified: Secondary | ICD-10-CM | POA: Diagnosis present

## 2023-09-13 DIAGNOSIS — I6523 Occlusion and stenosis of bilateral carotid arteries: Secondary | ICD-10-CM | POA: Diagnosis present

## 2023-09-13 DIAGNOSIS — I10 Essential (primary) hypertension: Secondary | ICD-10-CM | POA: Insufficient documentation

## 2023-09-13 DIAGNOSIS — I2510 Atherosclerotic heart disease of native coronary artery without angina pectoris: Secondary | ICD-10-CM | POA: Diagnosis present

## 2023-09-13 DIAGNOSIS — I498 Other specified cardiac arrhythmias: Secondary | ICD-10-CM | POA: Diagnosis present

## 2023-09-13 MED ORDER — METOPROLOL SUCCINATE ER 25 MG PO TB24
25.0000 mg | ORAL_TABLET | Freq: Every day | ORAL | 3 refills | Status: DC
  Filled 2023-09-13 – 2023-09-27 (×2): qty 90, 90d supply, fill #0
  Filled 2023-12-07: qty 90, 90d supply, fill #1

## 2023-09-13 NOTE — Patient Instructions (Signed)
Medication Instructions:  Metoprolol Succinate 25 mg daily.  *If you need a refill on your cardiac medications before your next appointment, please call your pharmacy*   Lab Work: NONE ordered at this time of appointment    Testing/Procedures: NONE ordered at this time of appointment     Follow-Up: At [facility], you and your health needs are our priority.  As part of our continuing mission to provide you with exceptional heart care, we have created designated Provider Care Teams.  These Care Teams include your primary Cardiologist (physician) and Advanced Practice Providers (APPs -  Physician Assistants and Nurse Practitioners) who all work together to provide you with the care you need, when you need it.  We recommend signing up for the patient portal called "MyChart".  Sign up information is provided on this After Visit Summary.  MyChart is used to connect with patients for Virtual Visits (Telemedicine).  Patients are able to view lab/test results, encounter notes, upcoming appointments, etc.  Non-urgent messages can be sent to your provider as well.   To learn more about what you can do with MyChart, go to [email or web address].    Your next appointment:    Keep appointment   Provider:   Nanetta Batty, MD     Other Instructions

## 2023-09-13 NOTE — Progress Notes (Signed)
Office Visit    Patient Name: [name] Date of Encounter: 09/13/2023  Primary Care Provider:  Margaree Mackintosh, MD Primary Cardiologist:  Nanetta Batty, MD  Chief Complaint    72 year old male with a history of CAD s/p CABG x 5 (LIMA-LAD, SVG-D1, SVG-OM/OM2, SVG-PDA) in 2009, DES-LCx in 2017,frequent PVCs, hypertension, hyperlipidemia with statin intolerance, carotid artery disease, TIA, laryngectomy, thyroidectomy, and prostate cancer who presents for follow-up related to PVCs and hypertension.   Past Medical History    Past Medical History:  Diagnosis Date   ADHD (attention deficit hyperactivity disorder)    Anxiety    Arthritis    "NECK, HANDS, KNEES" (06/14/2016)   Atypical mole 08/07/2021   Mid Back (Atypical Proliferation)   CAD (coronary artery disease)    Carotid artery stenosis    Complication of anesthesia    Reports that he had trouble swallowing post anesthesia- CABG   COPD (chronic obstructive pulmonary disease) (HCC) 12/11/2011   r/s mv - EF 72%; exercise capacity 13 METS; no exercised induced ischemic EKG changes   Depression, major    Dyspnea    with exertion   GERD (gastroesophageal reflux disease)    HTN (hypertension), benign 04/16/2008   echo - EF >55%; no regional wall or valvular abnormalities   Hyperlipidemia    statin intolerant   Hypothyroidism    Migraine    "crippling headaches as a kid; silent migraines now, no pain, couple times/week" (06/14/2016)   Peripheral vascular disease (HCC)    Prostate cancer (HCC) 04/2019   recent dx - Dr [name]   S/P angioplasty with stent 06/14/16 PCI & DES of high grade AV groove LCX and 1st OM  06/15/2016   Squamous cell cancer of tongue (HCC) ~ 2002   "35 radiation treatments at [facility]"   Stroke [facility])    hx Thalamic Ischemic CVA   Subclavian steal syndrome 06/17/2012   carotid doppler - R systolic brachial pressure , L ; R subclavian artery - proxmial obsstruction w/ abnormal  monophasic waveforms, R ECA known occlusive disease; L ECA narrowing w/ 70-99% diameter reductiona   TIA (transient ischemic attack) 2005   Viral hepatitis 1970s   "non specific"   Past Surgical History:  Procedure Laterality Date   BACK SURGERY     BIOPSY  02/20/2018   Procedure: BIOPSY;  Surgeon: [name], MD;  Location: [facility];  Service: Endoscopy;;   BIOPSY TONGUE  ~ 2002   "base of tongue and uvula"   CARDIAC CATHETERIZATION  04/26/2008   L circumflex 99% stenosed midportion straddling a marginal branch; RCA total priximally w/ grade 2 L to R collaterals; renal arteries widely patent; LAD 30% segmental proximal stenosis   CARDIAC CATHETERIZATION N/A 05/14/2016   Procedure: Left Heart Cath and Cors/Grafts Angiography;  Surgeon: [name], MD;  Location: [facility] INV[facility] CV [facility];  Service: Cardiovascular;  Laterality: N/A;   CARDIAC CATHETERIZATION N/A 06/14/2016   Procedure: Coronary Stent Intervention;  Surgeon: [name], MD;  Location: [facility] INV[facility] CV [facility];  Service: Cardiovascular;  Laterality: N/A;   CERVICAL DISC SURGERY  2000   Kritzer    Posterior approach   COLONOSCOPY WITH PROPOFOL N/A 02/20/2018   Procedure: COLONOSCOPY WITH PROPOFOL;  Surgeon: [name], MD;  Location: [facility];  Service: Endoscopy;  Laterality: N/A;   CORONARY ANGIOPLASTY WITH STENT PLACEMENT     CORONARY ARTERY BYPASS GRAFT  2009   x5; LIMA to LAd, sequential vein to first diagonal  branch, first and second obtuse marginal branches; vein to PDA   ESOPHAGOGASTRODUODENOSCOPY (EGD) WITH PROPOFOL N/A 02/20/2018   Procedure: ESOPHAGOGASTRODUODENOSCOPY (EGD) WITH PROPOFOL;  Surgeon: [name], MD;  Location: [facility];  Service: Endoscopy;  Laterality: N/A;   INSERTION OF MESH N/A 10/29/2017   Procedure: INSERTION OF MESH;  Surgeon: [name], MD;  Location: [facility] OR;  Service: General;  Laterality: N/A;   LUMBAR DISC SURGERY  06/17/2019   LUMBAR 2 & 3    LUMBAR  LAMINECTOMY/DECOMPRESSION MICRODISCECTOMY N/A 06/17/2019   Procedure: Lumbar two -Lumbar three decompression and disectomy;  Surgeon: [name], MD;  Location: [facility] OR;  Service: Orthopedics;  Laterality: N/A;  3 hrs   POLYPECTOMY  02/20/2018   Procedure: POLYPECTOMY;  Surgeon: [name], MD;  Location: [facility];  Service: Endoscopy;;   REVERSE SHOULDER ARTHROPLASTY Right 09/06/2023   Procedure: REVERSE SHOULDER ARTHROPLASTY;  Surgeon: [name], MD;  Location: [facility] ORS;  Service: Orthopedics;  Laterality: Right;  interscalene  100   SHOULDER ARTHROSCOPY WITH SUBACROMIAL DECOMPRESSION AND OPEN ROTATOR C Left 09/28/2013   Procedure: LEFT SHOULDER ARTHROSCOPY WITH SUBACROMIAL DECOMPRESSION AND MINI OPEN ROTATOR CUFF REPAIR, OPEN DISTAL CLAVICLE RESECTION AND OPEN BICEP TENDODESIS ;  Surgeon: [name], MD;  Location: [facility] OR;  Service: Orthopedics;  Laterality: Left;   TRANSCAROTID ARTERY REVASCULARIZATION  Right 05/05/2021   Procedure: RIGHT TRANSCAROTID ARTERY REVASCULARIZATION;  Surgeon: [name], MD;  Location: [facility] OR;  Service: Vascular;  Laterality: Right;   UMBILICAL HERNIA REPAIR N/A 10/29/2017   Procedure: UMBILICAL HERNIA REPAIR;  Surgeon: [name], MD;  Location: [facility];  Service: General;  Laterality: N/A;    Allergies  Allergies  Allergen Reactions   Crestor [Rosuvastatin Calcium] Other (See Comments)    Difficulty swallowing   Tramadol Shortness Of Breath   Lisinopril Cough   Gabapentin Other (See Comments)    Hallucinations   Tricor [Fenofibrate] Other (See Comments)    UNSPECIFIED REACTION   Lipitor [Atorvastatin Calcium] Other (See Comments)    Muscle spasms     Labs/Other Studies Reviewed    The following studies were reviewed today:  Cardiac Studies & Procedures   CARDIAC CATHETERIZATION  CARDIAC CATHETERIZATION 06/14/2016  Narrative Images from the original result were not included.   Mid Cx to Dist Cx lesion, 100 %stenosed.   SVG.  Origin lesion, 100 %stenosed.  Prox Cx to Mid Cx lesion, 80 %stenosed.  Post intervention, there is a 0% residual stenosis.  A stent was successfully placed.  Ost 2nd Mrg to 2nd Mrg lesion, 95 %stenosed.  Post intervention, there is a 0% residual stenosis.  A stent was successfully placed.  [name] is a 72 y.o. male   [id number] LOCATION:  FACILITY: [facility] PHYSICIAN: Nanetta Batty, M.D. 1951/06/04   DATE OF PROCEDURE:  06/14/2016  DATE OF DISCHARGE:     CARDIAC CATHETERIZATION [place]    History obtained from chart review.The patient is a very pleasant 72 year old, thin-appearing, married Caucasian male, father of 1, grandfather to 4 grandchildren who I last saw 03/14/16. He has a history of coronary artery disease status post bypass grafting x5 by Dr. Andrey Spearman June 16, 2008, with a free LIMA to his LAD, sequential vein to the first diagonal branch, first and second obtuse marginal branches, as well as a vein to the PDA. His other problems include hypertension and hyperlipidemia. He denies chest pain or shortness of breath. I did perform cerebral angiography on him May 05, 2008, because of  a question of a high-grade internal carotid artery stenosis read by the radiologist. However, this turned out to be a left external carotid artery stenosis. He does have high-grade ostial left internal mammary artery stenosis which necessitated the use of a free LIMA. He is statin intolerant. 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 A Myoview stress test performed December 11, 2011, was normal and carotid Dopplers did show a moderately severe right ICA stenosis which we have been following by duplex ultrasound. He is neurologically asymptomatic and in the event this requires revascularization, he would probably require carotid artery stenting given the fact that he has a "hostile neck" from prior  irradiation. Since I saw him 12 months ago he has noticed increasing dyspnea on exertion over the last several months, especially noticeable while mowing his lawn. I obtained a Myoview stress test on him based on this 03/29/16 that showed a large reversible lateral wall perfusion defect  consistent with ischemia. He underwent outpatient cardiac catheterization by myself on 05/14/16 revealing severe native and graft vessel disease with normal LV function. Specifically, his RCA vein graft was occluded to an occluded dominant right and his circumflex vein graft which was sequential to 2 obtuse marginal branches was occluded at the aorta to patent continuation between OM1 and 2. He did have native circumflex disease. Dr. Dorris Fetch saw him during his brief hospitalization to discuss the possibility of redo coronary artery bypass grafting although the consensus was that he would be better served with attempted PCI and stenting of his native circumflex coronary artery   Operators-Dr. Thayer Ohm End, Dr. Nanetta Batty  Impression Successful PCI and drug-eluting stenting of high-grade AV groove circumflex and first obtuse marginal branch stenosis in the setting of an occluded sequential vein graft with a patent continuation from OM1- OM 2 . The patient thought the procedure well. He will continue dual antiplatelet therapy. He'll be hydrated overnight and discharged home in the morning. I will see him back in the office in 2-3 weeks for follow-up.  Nanetta Batty. MD, [facility] 06/14/2016 9:03 AM  Findings Coronary Findings Diagnostic  Dominance: Right  Left Circumflex  Second Obtuse Marginal Branch  Saphenous Graft To 3rd Mrg SVG.  Intervention  Prox Cx to Mid Cx lesion Angioplasty A stent was successfully placed. There is a 0% residual stenosis post intervention.  Ost 2nd Mrg to 2nd Mrg lesion Angioplasty A stent was successfully placed. There is a 0% residual stenosis post intervention.   CARDIAC  CATHETERIZATION  CARDIAC CATHETERIZATION 05/14/2016  Narrative Images from the original result were not included.   Ost RCA to Prox RCA lesion, 100 %stenosed.  Ost 1st Diag to 1st Diag lesion, 100 %stenosed.  Mid Cx to Dist Cx lesion, 100 %stenosed.  Ost 2nd Mrg to 2nd Mrg lesion, 90 %stenosed.  Prox Cx to Mid Cx lesion, 70 %stenosed.  Origin to Prox Graft lesion, 100 %stenosed.  Origin to Dist Graft lesion, 100 %stenosed.  LIMA.  The left ventricular systolic function is normal.  LV end diastolic pressure is normal.  The left ventricular ejection fraction is 55-65% by visual estimate.  [name] is a 72 y.o. male   [id number] LOCATION:  FACILITY: [facility] PHYSICIAN: Nanetta Batty, M.D. 1950-10-15   DATE OF PROCEDURE:  05/14/2016  DATE OF DISCHARGE:     CARDIAC CATHETERIZATION    History obtained from chart review.The patient is a very pleasant 72 year old,  thin-appearing, married Caucasian male, father of 1, grandfather to 4 grandchildren who I last saw 03/14/16. He has a history of coronary artery disease status post bypass grafting x5 by Dr. Andrey Spearman June 16, 2008, with a free LIMA to his LAD, sequential vein to the first diagonal branch, first and second obtuse marginal branches, as well as a vein to the PDA. His other problems include hypertension and hyperlipidemia. He denies chest pain or shortness of breath. I did perform cerebral angiography on him May 05, 2008, because of a question of a high-grade internal carotid artery stenosis read by the radiologist. However, this turned out to be a left external carotid artery stenosis. He does have high-grade ostial left internal mammary artery stenosis which necessitated the use of a free LIMA. He is statin intolerant. 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 A Myoview stress test performed December 11, 2011, was normal and carotid  Dopplers did show a moderately severe right ICA stenosis which we have been following by duplex ultrasound. 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 Since I saw him 12 months ago he has noticed increasing dyspnea on exertion over the last several months, especially noticeable while mowing his lawn. I obtained a Myoview stress test on him based on this 03/29/16 that showed a large reversible lateral wall perfusion defect  consistent with ischemia. He presents today for outpatient diagnostic coronary angiography to define his anatomy.  Impression Mr. Dahm has an occluded Y graft to the diagonal branch, OM 2 and 3 with a patent sequential portion between the 2 marginal branches and high-grade AV groove circumflex and proximal marginal branch stenosis. His free LIMA is patent with LAD and his RCA is occluded as is the vein graft to the RCA with grade 3 left-to-right collaterals. He does have normal LV function. His Myoview showed lateral ischemia. He has effort angina. I suspect this occurred because of the occlusion of [place]  and Herbie [place] and have asked Dr. Dorris Fetch to review the films and talk to the patient. His symptoms are exertional and stable. I'm going to discharge him home as an outpatient and will follow him up and make final disposition based on Dr. Sunday Corn assessment.  Nanetta Batty. MD, [facility] 05/14/2016 2:23 PM  Findings Coronary Findings Diagnostic  Dominance: Right  Left Anterior Descending  First Diagonal Branch  Left Circumflex  Second Obtuse Marginal Branch  Right Coronary Artery  Right Posterior Descending Artery Collaterals RPDA filled by collaterals from Mid LAD.  Graft To Ost RPDA  Sequential Graft To 1st Mrg, 2nd Mrg  Sequential LIMA Graft To Lat 1st Diag, Dist LAD LIMA.  Intervention  No  interventions have been documented.   STRESS TESTS  MYOCARDIAL PERFUSION IMAGING 03/29/2016  Narrative  Nuclear stress EF: 58%.  The left ventricular ejection fraction is normal (55-65%).  No T wave inversion was noted during stress.  There was no ST segment deviation noted during stress.  Defect 1: There is a large defect of moderate severity.  This is an intermediate risk study.  Defect 2: There is a medium defect of moderate severity present in the basal anterolateral, mid inferolateral, mid anterolateral and apical lateral location.  Findings consistent with ischemia.  Moderate size and intensity reversible lateral wall perfusion defect suggestive  of ischemia. There is also a large, moderate intensity fixed inferior wall defect with underlying bowel attenuation which is likely artifact. LVEF 58% with normal wall motion. This is an intermediate risk study.  ECHOCARDIOGRAM  ECHOCARDIOGRAM COMPLETE 01/22/2022  Narrative ECHOCARDIOGRAM REPORT    Patient Name:   [name] Date of Exam: 01/22/2022 Medical Rec #:  [id number]       Height:       70.0 in Accession #:    [id number]      Weight:       149.4 lb Date of Birth:  1951-08-14        BSA:          1.844 m Patient Age:    71 years        BP:           120/60 mmHg Patient Gender: M               HR:           58 bpm. Exam Location:  Outpatient  Procedure: 2D Echo, Color Doppler, Cardiac Doppler and Strain Analysis  Indications:    R06.9 DOE; R42 Lightheaded  History:        Patient has prior history of Echocardiogram examinations, most recent 03/19/2016. CAD, TIA, Signs/Symptoms:Dizziness/Lightheadedness; Risk Factors:Hypertension, Dyslipidemia and Former Smoker. Patient denies chest pain and leg edema. He does have some DOE occassionally. Patient had a 2 week episode of dizziness after standing and it has since subsided. History of squamous cell cancer of the tongue and prostate cancer, both treated  without chemotherapy.  Sonographer:    Carlos American RVT, RDCS (AE), RDMS Referring Phys: [phone] Devoria Albe DICK   Sonographer Comments: Image acquisition challenging due to respiratory motion. IMPRESSIONS   1. Left ventricular ejection fraction, by estimation, is 60 to 65%. Left ventricular ejection fraction by 3D volume is 61 %. The left ventricle has normal function. The left ventricle has no regional wall motion abnormalities. There is mild concentric left ventricular hypertrophy. Left ventricular diastolic parameters are consistent with Grade II diastolic dysfunction (pseudonormalization). 2. Right ventricular systolic function is normal. The right ventricular size is normal. There is normal pulmonary artery systolic pressure. The estimated right ventricular systolic pressure is 25.3 mmHg. 3. Left atrial size was moderately dilated. 4. The mitral valve is abnormal. Mild mitral valve regurgitation. Moderate mitral annular calcification. 5. The aortic valve has an indeterminant number of cusps. There is mild calcification of the aortic valve. There is mild thickening of the aortic valve. Aortic valve regurgitation is mild. 6. Aortic dilatation noted. There is borderline dilatation of the aortic root, measuring 36 mm. 7. The inferior vena cava is normal in size with greater than 50% respiratory variability, suggesting right atrial pressure of 3 mmHg.  Comparison(s): Compared to prior TTE in 2017, there is no significant change.  FINDINGS Left Ventricle: Left ventricular ejection fraction, by estimation, is 60 to 65%. Left ventricular ejection fraction by 3D volume is 61 %. The left ventricle has normal function. The left ventricle has no regional wall motion abnormalities. The left ventricular internal cavity size was normal in size. There is mild concentric left ventricular hypertrophy. Left ventricular diastolic parameters are consistent with Grade II diastolic dysfunction  (pseudonormalization).  Right Ventricle: The right ventricular size is normal. No increase in right ventricular wall thickness. Right ventricular systolic function is normal. There is normal pulmonary artery systolic pressure. The tricuspid regurgitant velocity is 2.36 m/s, and with an  assumed right atrial pressure of 3 mmHg, the estimated right ventricular systolic pressure is 25.3 mmHg.  Left Atrium: Left atrial size was moderately dilated.  Right Atrium: Right atrial size was normal in size.  Pericardium: There is no evidence of pericardial effusion.  Mitral Valve: The mitral valve is abnormal. There is mild thickening of the mitral valve leaflet(s). There is mild calcification of the mitral valve leaflet(s). Moderate mitral annular calcification. Mild mitral valve regurgitation.  Tricuspid Valve: The tricuspid valve is normal in structure. Tricuspid valve regurgitation is trivial.  Aortic Valve: The aortic valve has an indeterminant number of cusps. There is mild calcification of the aortic valve. There is mild thickening of the aortic valve. Aortic valve regurgitation is mild. Aortic regurgitation PHT measures 531 msec. Aortic valve sclerosis/calcification is present, without any evidence of aortic stenosis. Aortic valve mean gradient measures 5.0 mmHg. Aortic valve peak gradient measures 9.1 mmHg. Aortic valve area, by VTI measures 1.88 cm.  Pulmonic Valve: The pulmonic valve was normal in structure. Pulmonic valve regurgitation is trivial.  Aorta: Aortic dilatation noted. There is borderline dilatation of the aortic root, measuring 36 mm.  Venous: The inferior vena cava is normal in size with greater than 50% respiratory variability, suggesting right atrial pressure of 3 mmHg.  IAS/Shunts: The atrial septum is grossly normal.   LEFT VENTRICLE PLAX 2D LVIDd:         4.42 cm         Diastology LVIDs:         2.81 cm         LV e' medial:    5.12 cm/s LV PW:         1.11 cm          LV E/e' medial:  21.1 LV IVS:        1.02 cm         LV e' lateral:   8.95 cm/s LVOT diam:     2.10 cm         LV E/e' lateral: 12.1 LV SV:         63 LV SV Index:   34              2D LVOT Area:     3.46 cm        Longitudinal Strain 2D Strain GLS  -15.9 % LV Volumes (MOD)               Avg: LV vol d, MOD    86.9 ml A2C:                           3D Volume EF LV vol d, MOD    75.0 ml       LV 3D EF:    Left A4C:                                        ventricul LV vol s, MOD    32.9 ml                    ar A2C:                                        ejection LV vol s, MOD  31.0 ml                    fraction A4C:                                        by 3D LV SV MOD A2C:   54.0 ml                    volume is LV SV MOD A4C:   75.0 ml                    61 %. LV SV MOD BP:    48.8 ml  3D Volume EF: 3D EF:        61 % LV EDV:       123 ml LV ESV:       48 ml LV SV:        74 ml  RIGHT VENTRICLE RV S prime:     8.55 cm/s TAPSE (M-mode): 1.3 cm  LEFT ATRIUM             Index        RIGHT ATRIUM           Index LA diam:        4.40 cm 2.39 cm/m   RA Area:     18.30 cm LA Vol (A2C):   97.1 ml 52.66 ml/m  RA Volume:   48.00 ml  26.03 ml/m LA Vol (A4C):   80.5 ml 43.66 ml/m LA Biplane Vol: 87.9 ml 47.67 ml/m AORTIC VALVE                     PULMONIC VALVE AV Area (Vmax):    2.00 cm      PV Vmax:          0.94 m/s AV Area (Vmean):   1.67 cm      PV Peak grad:     3.5 mmHg AV Area (VTI):     1.88 cm      PR End Diast Vel: 0.81 msec AV Vmax:           151.00 cm/s AV Vmean:          103.000 cm/s AV VTI:            0.337 m AV Peak Grad:      9.1 mmHg AV Mean Grad:      5.0 mmHg LVOT Vmax:         87.00 cm/s LVOT Vmean:        49.700 cm/s LVOT VTI:          0.183 m LVOT/AV VTI ratio: 0.54 AI PHT:            531 msec AR Vena Contracta: 0.16 cm  AORTA Ao Root diam: 3.60 cm Ao Asc diam:  3.10 cm Ao Arch diam: 3.1 cm  MITRAL VALVE                TRICUSPID  VALVE MV Area (PHT): 3.76 cm     TR Peak grad:   22.3 mmHg MV Decel Time: 202 msec     TR Vmax:        236.00 cm/s MV E velocity: 108.00 cm/s MV A velocity: 57.60 cm/s   SHUNTS MV E/A ratio:  1.88  Systemic VTI:  0.18 m Systemic Diam: 2.10 cm  Laurance Flatten MD Electronically signed by Laurance Flatten MD Signature Date/Time: 01/22/2022/3:30:55 PM    Final            Recent Labs: 01/31/2023: Magnesium 2.5 06/11/2023: ALT 18 06/14/2023: TSH 3.07 08/27/2023: BUN 24; Creatinine, Ser 1.41; Hemoglobin 17.1; Platelets 179; Potassium 4.0; Sodium 143  Recent Lipid Panel    Component Value Date/Time   CHOL 120 06/11/2023 0945   CHOL 164 07/03/2018 1241   TRIG 142 06/11/2023 0945   HDL 49 06/11/2023 0945   HDL 93 07/03/2018 1241   CHOLHDL 2.4 06/11/2023 0945   VLDL 22 05/06/2021 0432   LDLCALC 48 06/11/2023 0945    History of Present Illness    72 year old male with the above past medical history including CAD s/p CABG x 5 (LIMA-LAD, SVG-D1, SVG-OM/OM2, SVG-PDA) in 2009, DES-LCx in 2017, frequent PVCs, hypertension, hyperlipidemia with statin intolerance, carotid artery disease, TIA, laryngectomy, thyroidectomy, and prostate cancer.   Myoview in 2013 was low risk.  Repeat Myoview in June 2017 showed large reversible lateral perfusion defect consistent with ischemia.  Follow-up cardiac catheterization in 05/2016 showed severe native and graft disease, occluded RCA graft and OM graft, significant native left circumflex disease.  He underwent DES-LCx into OM branch in 06/2016. He underwent right carotid artery stenting per vascular surgery in July 2022 in the setting of severe carotid artery stenosis.  Most recent carotid ultrasound in 03/2022 revealed 50 to 70% R ICA stenosis, 40 to 59% LICA stenosis.  He has a history of statin intolerance and is on Praluent.  Additionally, he has a history of orthostatic hypotension with intermittent dizziness, labile BP and has followed with the  hypertension clinic pharmD. He underwent liver ablation of 2 lesions in August 2024.  PET/CT also showed solid pulmonary nodule measuring 5 mm in the right lower lobe.  He is following with oncology. He was last seen in the office on 06/17/2023 and was stable overall from a cardiac standpoint. He noted ongoing labile BP, intermittent lightheadedness, occasional palpitations.  Metoprolol tartrate was transitioned to metoprolol succinate 12.5 mg daily.  He underwent right shoulder rotator cuff arthroplasty on 09/06/2023.   He presents today for follow-up accompanied by his wife.  Since his last visit he has done well from a cardiac standpoint.  He has been taking metoprolol 25 mg daily and has been tolerating this well.  He is recovering from his shoulder surgery a week ago.  He has follow-up scheduled with both CT surgery and radiation oncology to discuss treatment for his lung nodules.  He denies any symptoms concerning for angina.  Denies any significant palpitations, dizziness, presyncope or syncope.  Overall, from a cardiac standpoint, he reports feeling well.  Home Medications    Current Outpatient Medications  Medication Sig Dispense Refill   acetaminophen (TYLENOL) 500 MG tablet Take 500 mg by mouth every 6 (six) hours as needed for mild pain (pain score 1-3) or moderate pain (pain score 4-6).     albuterol (VENTOLIN HFA) 108 (90 Base) MCG/ACT inhaler Inhale 2 puffs into the lungs every 6 (six) hours as needed for wheezing or shortness of breath. 18 g 5   amphetamine-dextroamphetamine (ADDERALL) 20 MG tablet Take 1 tablet (20 mg total) by mouth in the morning, at noon, and at bedtime. 270 tablet 0   aspirin EC 81 MG tablet Take 81 mg by mouth daily. Swallow whole.     buPROPion (WELLBUTRIN XL) 300 MG  24 hr tablet Take 1 tablet (300 mg total) by mouth every morning. 90 tablet 3   clotrimazole (LOTRIMIN AF) 1 % cream Apply 1 Application topically 2 (two) times daily. (Patient taking differently: Apply  1 Application topically daily as needed (Rash).) 28 g 0   Cyanocobalamin 2500 MCG SUBL Place 2,500 mcg under the tongue once a week.     Evolocumab (REPATHA SURECLICK) 140 MG/ML SOAJ Inject 140 mg into the skin every 14 (fourteen) days. 6 mL 3   levothyroxine (SYNTHROID) 100 MCG tablet Take 1 tablet (100 mcg total) by mouth in the morning. 90 tablet 1   LORazepam (ATIVAN) 1 MG tablet Take 1 tablet (1 mg total) by mouth 2 to 3 times a day as needed. ( no more than 2.5 per day) (Patient taking differently: Take 1 mg by mouth at bedtime.) 225 tablet 0   metoprolol succinate (TOPROL XL) 25 MG 24 hr tablet Take 1 tablet (25 mg total) by mouth daily. 90 tablet 3   ondansetron (ZOFRAN-ODT) 4 MG disintegrating tablet Take 4 mg by mouth every 8 (eight) hours as needed.     oxyCODONE-acetaminophen (PERCOCET) 5-325 MG tablet Take 1 tablet by mouth every 4 (four) hours as needed for severe pain (pain score 7-10) or moderate pain (pain score 4-6). 30 tablet 0   pantoprazole (PROTONIX) 40 MG tablet Take 1 tablet (40 mg total) by mouth daily. 90 tablet 3   sertraline (ZOLOFT) 100 MG tablet Take 1 tablet (100 mg total) by mouth daily. 90 tablet 4   tadalafil (CIALIS) 20 MG tablet Take 1 tablet (20 mg total) by mouth as needed for erectile dysfunction. 10 tablet 2   tiZANidine (ZANAFLEX) 4 MG tablet Take 1 tablet (4 mg total) by mouth every 8 (eight) hours as needed for muscle spasms. 30 tablet 0   Current Facility-Administered Medications  Medication Dose Route Frequency Provider Last Rate Last Admin   ticagrelor (BRILINTA) tablet 90 mg  90 mg Oral BID [name], MD         Review of Systems    He denies chest pain, dyspnea, pnd, orthopnea, n, v, dizziness, syncope, edema, weight gain, or early satiety. All other systems reviewed and are otherwise negative except as noted above.   Physical Exam    VS:  BP 116/64 (BP Location: Left Arm, Patient Position: Sitting, Cuff Size: Normal)   Pulse 61   Ht 5'  7" (1.702 m)   Wt 149 lb 3.2 oz (67.7 kg)   BMI 23.37 kg/m   GEN: Well nourished, well developed, in no acute distress. HEENT: normal. Neck: Supple, no JVD, carotid bruits, or masses. Cardiac: RRR with audible ectopy, no murmurs, rubs, or gallops. No clubbing, cyanosis, edema.  Radials/DP/PT 2+ and equal bilaterally.  Respiratory:  Respirations regular and unlabored, clear to auscultation bilaterally. GI: Soft, nontender, nondistended, BS + x 4. MS: no deformity or atrophy. Skin: warm and dry, no rash. Neuro:  Strength and sensation are intact. Psych: Normal affect.  Accessory Clinical Findings    ECG personally reviewed by me today - EKG Interpretation Date/Time:  Friday September 13 2023 13:34:58 EST Ventricular Rate:  61 PR Interval:  160 QRS Duration:  138 QT Interval:  490 QTC Calculation: 493 R Axis:   -75  Text Interpretation: Normal sinus rhythm Left axis deviation Right bundle branch block Inferior infarct (cited on or before 24-Oct-2016) When compared with ECG of 10-Jun-2019 11:50, No significant change since last tracing Confirmed by Jarome Lamas,  Irving Burton ([id number]) on 09/13/2023 1:37:13 PM  - no acute changes.   Lab Results  Component Value Date   WBC 6.7 08/27/2023   HGB 17.1 (H) 08/27/2023   HCT 50.1 08/27/2023   MCV 92.4 08/27/2023   PLT 179 08/27/2023   Lab Results  Component Value Date   CREATININE 1.41 (H) 08/27/2023   BUN 24 (H) 08/27/2023   NA 143 08/27/2023   K 4.0 08/27/2023   CL 101 08/27/2023   CO2 27 08/27/2023   Lab Results  Component Value Date   ALT 18 06/11/2023   AST 14 06/11/2023   ALKPHOS 57 05/05/2021   BILITOT 0.3 06/11/2023   Lab Results  Component Value Date   CHOL 120 06/11/2023   HDL 49 06/11/2023   LDLCALC 48 06/11/2023   TRIG 142 06/11/2023   CHOLHDL 2.4 06/11/2023    Lab Results  Component Value Date   HGBA1C 5.5 01/01/2017    Assessment & Plan    1. CAD:  S/p CABG x 5 (LIMA-LAD, SVG-D1, SVG-OM/OM2, SVG-PDA) in 2009,  DES-LCx in 2017. Stable with no anginal symptoms. No indication for ischemic evaluation.  Continue aspirin, metoprolol as below, Repatha.   2. Hypertension: History of labile BP/orthostatic hypotension.  He has followed up with the hypertension clinic Pharm.D.  He did not tolerate low-dose amlodipine.  He is currently tolerating metoprolol succinate.  He self-titrated his dose to 25 mg daily in the setting of frequent PVCs.  BP has been stable, PVCs have improved as below.  Will increase metoprolol succinate to 25 mg daily.  Continue to monitor BP and report SBP consistently less than 100, or SBP consistently greater than 150.   3. Bigeminy: Improved with metoprolol as above. Continue to monitor BP/HR, continue to monitor symptoms. Continue metoprolol as above.    4. Hyperlipidemia: LDL was 48 in 06/2023. Monitored and managed per PCP.  Continue Repatha.   5. Carotid artery disease: S/p R carotid artery stenting in 2022. Most recent carotid ultrasound in 03/2023 revealed 50 to 75% R ICA stenosis, 40 to 59% LICA stenosis. Follows with vascular surgery.    6. Lung nodules: Following with oncology, CT surgery.  7. Disposition: Follow-up as scheduled with Dr. Allyson Sabal in 11/2022.       Joylene Grapes, NP 09/13/2023, 2:05 PM

## 2023-09-17 ENCOUNTER — Other Ambulatory Visit (HOSPITAL_BASED_OUTPATIENT_CLINIC_OR_DEPARTMENT_OTHER): Payer: Self-pay

## 2023-09-17 MED ORDER — OXYCODONE-ACETAMINOPHEN 5-325 MG PO TABS
1.0000 | ORAL_TABLET | Freq: Four times a day (QID) | ORAL | 0 refills | Status: DC
  Filled 2023-09-17: qty 60, 15d supply, fill #0

## 2023-09-27 ENCOUNTER — Other Ambulatory Visit: Payer: Self-pay

## 2023-09-27 ENCOUNTER — Other Ambulatory Visit (HOSPITAL_BASED_OUTPATIENT_CLINIC_OR_DEPARTMENT_OTHER): Payer: Self-pay

## 2023-09-27 MED ORDER — LORAZEPAM 1 MG PO TABS
ORAL_TABLET | ORAL | 0 refills | Status: DC
  Filled 2023-09-27: qty 225, 90d supply, fill #0

## 2023-10-01 ENCOUNTER — Other Ambulatory Visit (HOSPITAL_BASED_OUTPATIENT_CLINIC_OR_DEPARTMENT_OTHER): Payer: Self-pay

## 2023-10-01 MED ORDER — HYDROCODONE-ACETAMINOPHEN 5-325 MG PO TABS
1.0000 | ORAL_TABLET | ORAL | 0 refills | Status: DC | PRN
  Filled 2023-10-01: qty 35, 3d supply, fill #0

## 2023-10-09 ENCOUNTER — Other Ambulatory Visit (HOSPITAL_BASED_OUTPATIENT_CLINIC_OR_DEPARTMENT_OTHER): Payer: Self-pay

## 2023-10-09 MED ORDER — HYDROCODONE-ACETAMINOPHEN 5-325 MG PO TABS
1.0000 | ORAL_TABLET | ORAL | 0 refills | Status: DC | PRN
  Filled 2023-10-09: qty 60, 5d supply, fill #0

## 2023-10-10 ENCOUNTER — Other Ambulatory Visit (HOSPITAL_BASED_OUTPATIENT_CLINIC_OR_DEPARTMENT_OTHER): Payer: Self-pay

## 2023-10-10 MED ORDER — AMPHETAMINE-DEXTROAMPHETAMINE 20 MG PO TABS
20.0000 mg | ORAL_TABLET | Freq: Three times a day (TID) | ORAL | 0 refills | Status: DC
  Filled 2023-10-10: qty 270, 90d supply, fill #0

## 2023-10-22 ENCOUNTER — Other Ambulatory Visit (HOSPITAL_BASED_OUTPATIENT_CLINIC_OR_DEPARTMENT_OTHER): Payer: Self-pay

## 2023-10-22 MED ORDER — HYDROCODONE-ACETAMINOPHEN 5-325 MG PO TABS
ORAL_TABLET | ORAL | 0 refills | Status: DC
  Filled 2023-10-22: qty 20, 5d supply, fill #0

## 2023-10-25 NOTE — Therapy (Signed)
OUTPATIENT PHYSICAL THERAPY SHOULDER EVALUATION   Patient Name: [name] MRN: [id number] DOB:1951-03-31, 73 y.o., male Today's Date: 10/29/2023  END OF SESSION:  PT End of Session - 10/29/23 1153     Visit Number 1    Date for PT Re-Evaluation 12/24/23    Authorization Type Medicare + [employer] Supplement    Progress Note Due on Visit 10    PT Start Time 1150    PT Stop Time 1235    PT Time Calculation (min) 45 min    Activity Tolerance Patient tolerated treatment well;Patient limited by pain    Behavior During Therapy WFL for tasks assessed/performed             Past Medical History:  Diagnosis Date   ADHD (attention deficit hyperactivity disorder)    Anxiety    Arthritis    "NECK, HANDS, KNEES" (06/14/2016)   Atypical mole 08/07/2021   Mid Back (Atypical Proliferation)   CAD (coronary artery disease)    Carotid artery stenosis    Complication of anesthesia    Reports that he had trouble swallowing post anesthesia- CABG   COPD (chronic obstructive pulmonary disease) (HCC) 12/11/2011   r/s mv - EF 72%; exercise capacity 13 METS; no exercised induced ischemic EKG changes   Depression, major    Dyspnea    with exertion   GERD (gastroesophageal reflux disease)    HTN (hypertension), benign 04/16/2008   echo - EF >55%; no regional wall or valvular abnormalities   Hyperlipidemia    statin intolerant   Hypothyroidism    Migraine    "crippling headaches as a kid; silent migraines now, no pain, couple times/week" (06/14/2016)   Peripheral vascular disease (HCC)    Prostate cancer (HCC) 04/2019   recent dx - Dr [name]   S/P angioplasty with stent 06/14/16 PCI & DES of high grade AV groove LCX and 1st OM  06/15/2016   Squamous cell cancer of tongue (HCC) ~ 2002   "35 radiation treatments at [facility]"   Stroke [facility])    hx Thalamic Ischemic CVA   Subclavian steal syndrome 06/17/2012   carotid doppler - R systolic brachial pressure , L ; R  subclavian artery - proxmial obsstruction w/ abnormal monophasic waveforms, R ECA known occlusive disease; L ECA narrowing w/ 70-99% diameter reductiona   TIA (transient ischemic attack) 2005   Viral hepatitis 1970s   "non specific"   Past Surgical History:  Procedure Laterality Date   BACK SURGERY     BIOPSY  02/20/2018   Procedure: BIOPSY;  Surgeon: Charna Drena Ham, MD;  Location: [facility] ENDOS[facility];  Service: Endoscopy;;   BIOPSY TONGUE  ~ 2002   "base of tongue and uvula"   CARDIAC CATHETERIZATION  04/26/2008   L circumflex 99% stenosed midportion straddling a marginal branch; RCA total priximally w/ grade 2 L to R collaterals; renal arteries widely patent; LAD 30% segmental proximal stenosis   CARDIAC CATHETERIZATION N/A 05/14/2016   Procedure: Left Heart Cath and Cors/Grafts Angiography;  Surgeon: [name], MD;  Location: [facility] INVAS[facility];  Service: Cardiovascular;  Laterality: N/A;   CARDIAC CATHETERIZATION N/A 06/14/2016   Procedure: Coronary Stent Intervention;  Surgeon: [name], MD;  Location: [facility] INVAS[facility] CV [facility];  Service: Cardiovascular;  Laterality: N/A;   CERVICAL DISC SURGERY  2000   Kritzer    Posterior approach   COLONOSCOPY WITH PROPOFOL N/A 02/20/2018   Procedure: COLONOSCOPY WITH PROPOFOL;  Surgeon: Charna Clela Hagadorn, MD;  Location: [facility];  Service: Endoscopy;  Laterality: N/A;   CORONARY ANGIOPLASTY WITH STENT PLACEMENT     CORONARY ARTERY BYPASS GRAFT  2009   x5; LIMA to LAd, sequential vein to first diagonal branch, first and second obtuse marginal branches; vein to PDA   ESOPHAGOGASTRODUODENOSCOPY (EGD) WITH PROPOFOL N/A 02/20/2018   Procedure: ESOPHAGOGASTRODUODENOSCOPY (EGD) WITH PROPOFOL;  Surgeon: Charna Marlys Stegmaier, MD;  Location: [facility];  Service: Endoscopy;  Laterality: N/A;   INSERTION OF MESH N/A 10/29/2017   Procedure: INSERTION OF MESH;  Surgeon: [name], MD;  Location[facility];  Service: General;  Laterality: N/A;   LUMBAR DISC SURGERY   06/17/2019   LUMBAR 2 & 3    LUMBAR LAMINECTOMY/DECOMPRESSION MICRODISCECTOMY N/A 06/17/2019   Procedure: Lumbar two -Lumbar three decompression and disectomy;  Surgeon: [name], MD;  Location: [facility] OR;  Service: Orthopedics;  Laterality: N/A;  3 hrs   POLYPECTOMY  02/20/2018   Procedure: POLYPECTOMY;  Surgeon: Charna Yobani Schertzer, MD;  Location: [facility];  Service: Endoscopy;;   REVERSE SHOULDER ARTHROPLASTY Right 09/06/2023   Procedure: REVERSE SHOULDER ARTHROPLASTY;  Surgeon: [name], MD;  Location: [facility] ORS;  Service: Orthopedics;  Laterality: Right;  interscalene  100   SHOULDER ARTHROSCOPY WITH SUBACROMIAL DECOMPRESSION AND OPEN ROTATOR C Left 09/28/2013   Procedure: LEFT SHOULDER ARTHROSCOPY WITH SUBACROMIAL DECOMPRESSION AND MINI OPEN ROTATOR CUFF REPAIR, OPEN DISTAL CLAVICLE RESECTION AND OPEN BICEP TENDODESIS ;  Surgeon: [name], MD;  Location[facility] OR;  Service: Orthopedics;  Laterality: Left;   TRANSCAROTID ARTERY REVASCULARIZATION  Right 05/05/2021   Procedure: RIGHT TRANSCAROTID ARTERY REVASCULARIZATION;  Surgeon: [name], MD;  Location: [facility] OR;  Service: Vascular;  Laterality: Right;   UMBILICAL HERNIA REPAIR N/A 10/29/2017   Procedure: UMBILICAL HERNIA REPAIR;  Surgeon: [name], MD;  Location[facility] OR;  Service: General;  Laterality: N/A;   Patient Active Problem List   Diagnosis Date Noted   Carotid stenosis, right 05/05/2021   Former smoker 03/24/2020   SOB (shortness of breath) 03/24/2020   Lumbar disc herniation 06/17/2019   Right lumbar radiculopathy 05/16/2019   Elevated PSA 05/16/2019   S/P angioplasty with stent 06/14/16 PCI & DES of high grade AV groove LCX and 1st OM  06/15/2016   CAD (coronary artery disease) 06/14/2016   Abnormal stress test    Carotid artery disease (HCC) 05/20/2013   Hypothyroidism 03/29/2012   Erectile dysfunction 07/02/2011   Anxiety 07/02/2011   Back pain 07/02/2011   Cervical disc disease 07/02/2011   Coronary  artery disease 04/29/2011   Squamous cell carcinoma of tongue (HCC) 04/29/2011   Hypertension 04/29/2011   Depression 04/29/2011   GE reflux 04/29/2011   Internal carotid artery stenosis 04/29/2011   Ptosis of eyelid, left 04/29/2011   Hyperlipidemia 04/29/2011   History of ischemic vertebrobasilar artery thalamic stroke 04/29/2011   Attention deficit disorder 04/29/2011   Degenerative joint disease of cervical spine 04/29/2011    PCP: Margaree Mackintosh, MD    REFERRING PROVIDER: [name], MD  REFERRING DIAG:  Z47.89 (ICD-10-CM) - Orthopedic aftercare  [phone] (ICD-10-CM) - S/p reverse total shoulder arthroplasty    THERAPY DIAG:  Acute pain of right shoulder  Stiffness of right shoulder, not elsewhere classified  Muscle weakness (generalized)  Rationale for Evaluation and Treatment: Rehabilitation  ONSET DATE: DOS R shoulder rTSA 09/06/2023  SUBJECTIVE:  SUBJECTIVE STATEMENT: Patient is s/p 7 weeks R rTSA.  Shoulder never really got better to use it, finally here at PT.  Has decreased ROM, wakes him up at night, can't pick up anything with his R arm, can finally touch his ear on the R side, but can't wash his hair.   Surgery was delayed by COVID, prostate surgery and other things.  Wife accompanied him to help "translate" today.  Hand dominance: Left  NEXT MD VISIT:  December 04, 2023  PERTINENT HISTORY: Anxiety, depression, history of arthritis/cervical disk disease, CAD, COPD, dyspnea on exertion, prostate cancer, squamous cell cancer of tongue, history of stroke, R RTC repair, R carotid stenosis, back pain.   PAIN:  Are you having pain? Yes: NPRS scale: 7-8/10 Pain location: R shoulder  Pain description: sharp, achey Aggravating factors: moving wrong, sleeping on it it. Relieving factors:  Percocet, muscle relaxers  PRECAUTIONS: None  RED FLAGS: None   WEIGHT BEARING RESTRICTIONS: No  FALLS:  Has patient fallen in last 6 months?  Occasionally tripping over the dogs.   LIVING ENVIRONMENT: Lives with: lives with their spouse Lives in: House/apartment Stairs: Yes: Internal: 14 steps; on left going up and External: 2 steps; on left going up Has following equipment at home: None  OCCUPATION: retired  PLOF: Independent and Leisure: woodworking  PATIENT GOALS: getting back to doing stuff and not being miserable.  Also has a couple of nodules in lungs, needs to be able to have arms up.   OBJECTIVE:   DIAGNOSTIC FINDINGS:  10/22/2023 Imaging: AP and scapular Y views of the right shoulder show a well-fixed and well-positioned right reverse TSA. No periprosthetic fractures or acromial fractures noted.    PATIENT SURVEYS:  Quick Dash 61.4%  COGNITION: Overall cognitive status: Within functional limits for tasks assessed     SENSATION: Reports pins/needles at night in R hand  POSTURE: Forward head, rounded shoulders.   UPPER EXTREMITY ROM:   Active ROM Right eval Left eval  Shoulder flexion 60 135  Shoulder extension 35 45  Shoulder abduction 45 85  Shoulder adduction    Shoulder internal rotation 50 F- to ear 65 F- back of head   Shoulder external rotation 10 F-to hip 70 F-small of back  (Blank rows = not tested)  UPPER EXTREMITY MMT:  MMT Right eval Left eval  Shoulder flexion 2 5  Shoulder extension 2   Shoulder abduction 2 5  Shoulder internal rotation 2 5  Shoulder external rotation 2 5  Elbow flexion 4 5  Elbow extension 4p! 5  Wrist flexion    Wrist extension    Grip strength (lbs)    (Blank rows = not tested)  JOINT MOBILITY TESTING:  Decreased mobility all directions in [facility] joint  PALPATION:   Tenderness posterior R shoulder.   TODAY'S TREATMENT:  DATE:   10/29/23 EVAL Therapeutic Exercise: to improve strength and mobility.  Demo, verbal and tactile cues throughout for technique. Isometric shoulder exercises - flexion, extension, IR, ER was painful today Table slides - cues to use body for AAROM AAROM flexion with swiffer.  Self Care: Findings, POC, importance of moving and strengthening shoulder but to tolerance.    PATIENT EDUCATION: Education details: findings, POC, initial HEP Person educated: Patient Education method: Explanation, Demonstration, Verbal cues, and Handouts Education comprehension: verbalized understanding and returned demonstration  HOME EXERCISE PROGRAM: Access Code: [email or web address] URL: [email or web address] Date: 10/29/2023 Prepared by: Harrie Foreman  Exercises - Seated Shoulder Flexion Towel Slide at Table Top  - 1 x daily - 7 x weekly - 1 sets - 10 reps - Seated Scapular Retraction  - 1 x daily - 7 x weekly - 1 sets - 5 reps - 5 sec hold - Isometric Shoulder Adduction  - 3 x daily - 7 x weekly - 1 sets - 5 reps - 5 sec hold - Isometric Shoulder External Rotation  - 3 x daily - 7 x weekly - 1 sets - 5 reps - 5 hold - Isometric Shoulder Internal Rotation  - 3 x daily - 7 x weekly - 1 sets - 5 reps - 5 hold - Isometric Shoulder Flexion  - 3 x daily - 7 x weekly - 1 sets - 5 reps - 5 hold - Seated Isometric Shoulder Extension  - 3 x daily - 7 x weekly - 1 sets - 5 reps - 5 hold - Seated Shoulder Flexion Extension AAROM with Dowel into Wall  - 3 x daily - 7 x weekly - 1 sets - 10 reps  ASSESSMENT:  CLINICAL IMPRESSION: Patient is a 73 y.o. left hand dominant male who was seen today for physical therapy evaluation and treatment for continued pain s/p R rTSA on 09/06/2023.  He demonstrates significant pain and impairments in ROM, with extremely limited ER and concerns for adhesive capsulitis today.  Lyn Henri would benefit from  skilled therapy to improve Right shoulder ROM, decrease pain and improve functional mobility and ability to perform ADLs. Given gentle isometric and AAROM exercises to perform to tolerance to decrease pain.   OBJECTIVE IMPAIRMENTS: decreased activity tolerance, decreased mobility, decreased ROM, decreased strength, hypomobility, increased fascial restrictions, increased muscle spasms, impaired flexibility, impaired sensation, impaired UE functional use, postural dysfunction, and pain.   ACTIVITY LIMITATIONS: carrying, lifting, bending, sleeping, bed mobility, bathing, dressing, reach over head, and hygiene/grooming  PARTICIPATION LIMITATIONS: meal prep, cleaning, laundry, driving, shopping, community activity, and yard work  PERSONAL FACTORS: Age, Past/current experiences, Time since onset of injury/illness/exacerbation, and 3+ comorbidities: Anxiety, depression, history of arthritis/cervical disk disease, CAD, COPD, dyspnea on exertion, prostate cancer, squamous cell cancer of tongue, history of stroke, R RTC repair, R carotid stenosis, back pain.   are also affecting patient's functional outcome.   REHAB POTENTIAL: Good  CLINICAL DECISION MAKING: Evolving/moderate complexity  EVALUATION COMPLEXITY: Moderate  GOALS: Goals reviewed with patient? Yes  SHORT TERM GOALS: Target date: 11/26/2023   Patient will be independent with initial HEP.  Baseline: given Goal status: INITIAL  2.  Patient will reports pain < 3/10 with ROM Baseline: 7-8/10 Goal status: INITIAL  3.  Patient will demonstrate ROM to 130 deg forward elevation and 30 deg ER  Baseline: 60 deg ff, 10 deg ER Goal status: INITIAL   LONG TERM GOALS: Target date: 12/24/2023   Patient will be independent with advanced/ongoing HEP  to improve outcomes and carryover.  Baseline:  Goal status: INITIAL  2.  Patient will report 75% improvement in Right shoulder pain to improve QOL.  Baseline:  Goal status: INITIAL  3.   Patient  to improve Right shoulder AROM to [facility] without pain provocation to allow for increased ease of ADLs.  Baseline: see objective Goal status: INITIAL  4.  Patient will demonstrate improved functional UE strength as demonstrated by 4+/5 R shoulder strength. Baseline: see objective Goal status: INITIAL  5.  Patient will report 10 points improvement on QuickDash to demonstrate improved functional ability.  Baseline: 61.4% impairment Goal status: INITIAL  6.  Patient will be able to be able to lay with arms overhead comfortably to undergo radiation treatment.    Baseline: unable Goal status: INITIAL   PLAN:  PT FREQUENCY: 2x/week  PT DURATION: 8 weeks  PLANNED INTERVENTIONS: 97110-Therapeutic exercises, 97530- Therapeutic activity, [id number]- Neuromuscular re-education, 97535- Self Care, [id number]- Manual therapy, 97014- Electrical stimulation (unattended), [id number]- Electrical stimulation (manual), 97016- Vasopneumatic device, [id number]- Ultrasound, [id number]- Ionotophoresis 4mg /ml Dexamethasone, Patient/Family education, Taping, Dry Needling, Joint mobilization, Joint manipulation, Spinal manipulation, Spinal mobilization, Cryotherapy, and Moist heat  PLAN FOR NEXT SESSION: gentle mobilization for ROM, progress per protocol and tolerance, DOS 09/06/2023, 8 weeks post op as of 11/01/2023   Jena Gauss, PT, DPT 10/29/2023, 3:33 PM

## 2023-10-29 ENCOUNTER — Ambulatory Visit: Payer: Medicare Other | Attending: Orthopedic Surgery | Admitting: Physical Therapy

## 2023-10-29 ENCOUNTER — Encounter: Payer: Self-pay | Admitting: Physical Therapy

## 2023-10-29 ENCOUNTER — Other Ambulatory Visit: Payer: Self-pay

## 2023-10-29 DIAGNOSIS — M25611 Stiffness of right shoulder, not elsewhere classified: Secondary | ICD-10-CM | POA: Insufficient documentation

## 2023-10-29 DIAGNOSIS — M6281 Muscle weakness (generalized): Secondary | ICD-10-CM | POA: Insufficient documentation

## 2023-10-29 DIAGNOSIS — M25511 Pain in right shoulder: Secondary | ICD-10-CM | POA: Insufficient documentation

## 2023-11-05 ENCOUNTER — Ambulatory Visit: Payer: Medicare Other | Attending: Orthopedic Surgery

## 2023-11-05 DIAGNOSIS — M25611 Stiffness of right shoulder, not elsewhere classified: Secondary | ICD-10-CM | POA: Diagnosis present

## 2023-11-05 DIAGNOSIS — M6281 Muscle weakness (generalized): Secondary | ICD-10-CM | POA: Diagnosis present

## 2023-11-05 DIAGNOSIS — M25511 Pain in right shoulder: Secondary | ICD-10-CM | POA: Diagnosis present

## 2023-11-05 NOTE — Therapy (Signed)
 OUTPATIENT PHYSICAL THERAPY SHOULDER TREATMENT   Patient Name: [name] MRN: [id number] DOB:10-Sep-1951, 73 y.o., male Today's Date: 11/05/2023  END OF SESSION:  PT End of Session - 11/05/23 1106     Visit Number 2    Date for PT Re-Evaluation 12/24/23    Authorization Type Medicare + [employer] Supplement    Progress Note Due on Visit 10    PT Start Time 1015    PT Stop Time 1058    PT Time Calculation (min) 43 min    Activity Tolerance Patient tolerated treatment well;Patient limited by pain    Behavior During Therapy WFL for tasks assessed/performed              Past Medical History:  Diagnosis Date   ADHD (attention deficit hyperactivity disorder)    Anxiety    Arthritis    NECK, HANDS, KNEES (06/14/2016)   Atypical mole 08/07/2021   Mid Back (Atypical Proliferation)   CAD (coronary artery disease)    Carotid artery stenosis    Complication of anesthesia    Reports that he had trouble swallowing post anesthesia- CABG   COPD (chronic obstructive pulmonary disease) (HCC) 12/11/2011   r/s mv - EF 72%; exercise capacity 13 METS; no exercised induced ischemic EKG changes   Depression, major    Dyspnea    with exertion   GERD (gastroesophageal reflux disease)    HTN (hypertension), benign 04/16/2008   echo - EF >55%; no regional wall or valvular abnormalities   Hyperlipidemia    statin intolerant   Hypothyroidism    Migraine    crippling headaches as a kid; silent migraines now, no pain, couple times/week (06/14/2016)   Peripheral vascular disease (HCC)    Prostate cancer (HCC) 04/2019   recent dx - Dr Renay Moats   S/P angioplasty with stent 06/14/16 PCI & DES of high grade AV groove LCX and 1st OM  06/15/2016   Squamous cell cancer of tongue (HCC) ~ 2002   [facility] at [facility]   Stroke [facility])    hx Thalamic Ischemic CVA   Subclavian steal syndrome 06/17/2012   carotid doppler - R systolic brachial pressure , L ; R  subclavian artery - proxmial obsstruction w/ abnormal monophasic waveforms, R ECA known occlusive disease; L ECA narrowing w/ 70-99% diameter reductiona   TIA (transient ischemic attack) 2005   Viral hepatitis 1970s   non specific   Past Surgical History:  Procedure Laterality Date   BACK SURGERY     BIOPSY  02/20/2018   Procedure: BIOPSY;  Surgeon: Kristie Lamprey, MD;  Location: [facility];  Service: Endoscopy;;   BIOPSY TONGUE  ~ 2002   base of tongue and uvula   CARDIAC CATHETERIZATION  04/26/2008   L circumflex 99% stenosed midportion straddling a marginal branch; RCA total priximally w/ grade 2 L to R collaterals; renal arteries widely patent; LAD 30% segmental proximal stenosis   CARDIAC CATHETERIZATION N/A 05/14/2016   Procedure: Left Heart Cath and Cors/Grafts Angiography;  Surgeon: Dorn JINNY Lesches, MD;  Location: [facility];  Service: Cardiovascular;  Laterality: N/A;   CARDIAC CATHETERIZATION N/A 06/14/2016   Procedure: Coronary Stent Intervention;  Surgeon: Dorn JINNY Lesches, MD;  Location: [facility] CV [facility];  Service: Cardiovascular;  Laterality: N/A;   CERVICAL DISC SURGERY  2000   Kritzer    Posterior approach   COLONOSCOPY WITH PROPOFOL  N/A 02/20/2018   Procedure: COLONOSCOPY WITH PROPOFOL ;  Surgeon: Kristie Lamprey, MD;  Location:  [facility];  Service: Endoscopy;  Laterality: N/A;   CORONARY ANGIOPLASTY WITH STENT PLACEMENT     CORONARY ARTERY BYPASS GRAFT  2009   x5; LIMA to LAd, sequential vein to first diagonal branch, first and second obtuse marginal branches; vein to PDA   ESOPHAGOGASTRODUODENOSCOPY (EGD) WITH PROPOFOL  N/A 02/20/2018   Procedure: ESOPHAGOGASTRODUODENOSCOPY (EGD) WITH PROPOFOL ;  Surgeon: Kristie Lamprey, MD;  Location: [facility];  Service: Endoscopy;  Laterality: N/A;   INSERTION OF MESH N/A 10/29/2017   Procedure: INSERTION OF MESH;  Surgeon: Belinda Cough, MD;  Location: [facility] OR;  Service: General;  Laterality: N/A;   LUMBAR DISC SURGERY   06/17/2019   LUMBAR 2 & 3    LUMBAR LAMINECTOMY/DECOMPRESSION MICRODISCECTOMY N/A 06/17/2019   Procedure: Lumbar two -Lumbar three decompression and disectomy;  Surgeon: Burnetta Aures, MD;  Location: [facility] OR;  Service: Orthopedics;  Laterality: N/A;  3 hrs   POLYPECTOMY  02/20/2018   Procedure: POLYPECTOMY;  Surgeon: Kristie Lamprey, MD;  Location: [facility];  Service: Endoscopy;;   REVERSE SHOULDER ARTHROPLASTY Right 09/06/2023   Procedure: REVERSE SHOULDER ARTHROPLASTY;  Surgeon: Kay Kemps, MD;  Location: [facility] ORS;  Service: Orthopedics;  Laterality: Right;  interscalene  100   SHOULDER ARTHROSCOPY WITH SUBACROMIAL DECOMPRESSION AND OPEN ROTATOR C Left 09/28/2013   Procedure: LEFT SHOULDER ARTHROSCOPY WITH SUBACROMIAL DECOMPRESSION AND MINI OPEN ROTATOR CUFF REPAIR, OPEN DISTAL CLAVICLE RESECTION AND OPEN BICEP TENDODESIS ;  Surgeon: Elspeth JONELLE Kay, MD;  Location: [facility] OR;  Service: Orthopedics;  Laterality: Left;   TRANSCAROTID ARTERY REVASCULARIZATION  Right 05/05/2021   Procedure: RIGHT TRANSCAROTID ARTERY REVASCULARIZATION;  Surgeon: Magda Debby SAILOR, MD;  Location: [facility] OR;  Service: Vascular;  Laterality: Right;   UMBILICAL HERNIA REPAIR N/A 10/29/2017   Procedure: UMBILICAL HERNIA REPAIR;  Surgeon: Belinda Cough, MD;  Location: [facility] OR;  Service: General;  Laterality: N/A;   Patient Active Problem List   Diagnosis Date Noted   Carotid stenosis, right 05/05/2021   Former smoker 03/24/2020   SOB (shortness of breath) 03/24/2020   Lumbar disc herniation 06/17/2019   Right lumbar radiculopathy 05/16/2019   Elevated PSA 05/16/2019   S/P angioplasty with stent 06/14/16 PCI & DES of high grade AV groove LCX and 1st OM  06/15/2016   CAD (coronary artery disease) 06/14/2016   Abnormal stress test    Carotid artery disease (HCC) 05/20/2013   Hypothyroidism 03/29/2012   Erectile dysfunction 07/02/2011   Anxiety 07/02/2011   Back pain 07/02/2011   Cervical disc disease 07/02/2011   Coronary  artery disease 04/29/2011   Squamous cell carcinoma of tongue (HCC) 04/29/2011   Hypertension 04/29/2011   Depression 04/29/2011   GE reflux 04/29/2011   Internal carotid artery stenosis 04/29/2011   Ptosis of eyelid, left 04/29/2011   Hyperlipidemia 04/29/2011   History of ischemic vertebrobasilar artery thalamic stroke 04/29/2011   Attention deficit disorder 04/29/2011   Degenerative joint disease of cervical spine 04/29/2011    PCP: Perri Ronal PARAS, MD    REFERRING PROVIDER: Kay Kemps, MD  REFERRING DIAG:  Z47.89 (ICD-10-CM) - Orthopedic aftercare  [phone] (ICD-10-CM) - S/p reverse total shoulder arthroplasty    THERAPY DIAG:  Acute pain of right shoulder  Stiffness of right shoulder, not elsewhere classified  Muscle weakness (generalized)  Rationale for Evaluation and Treatment: Rehabilitation  ONSET DATE: DOS R shoulder rTSA 09/06/2023  SUBJECTIVE:  SUBJECTIVE STATEMENT: Pt reports that he has been doing HEP, has issues with the table slides. Wife accompanied him to help translate today.  Hand dominance: Left  NEXT MD VISIT:  December 04, 2023  PERTINENT HISTORY: Anxiety, depression, history of arthritis/cervical disk disease, CAD, COPD, dyspnea on exertion, prostate cancer, squamous cell cancer of tongue, history of stroke, R RTC repair, R carotid stenosis, back pain.   PAIN:  Are you having pain? Yes: NPRS scale: 7/10 Pain location: R shoulder  Pain description: sharp, achey Aggravating factors: moving wrong, sleeping on it it. Relieving factors: Percocet, muscle relaxers  PRECAUTIONS: None  RED FLAGS: None   WEIGHT BEARING RESTRICTIONS: No  FALLS:  Has patient fallen in last 6 months?  Occasionally tripping over the dogs.   LIVING ENVIRONMENT: Lives with: lives with  their spouse Lives in: House/apartment Stairs: Yes: Internal: 14 steps; on left going up and External: 2 steps; on left going up Has following equipment at home: None  OCCUPATION: retired  PLOF: Independent and Leisure: woodworking  PATIENT GOALS: getting back to doing stuff and not being miserable.  Also has a couple of nodules in lungs, needs to be able to have arms up.   OBJECTIVE:   DIAGNOSTIC FINDINGS:  10/22/2023 Imaging: AP and scapular Y views of the right shoulder show a well-fixed and well-positioned right reverse TSA. No periprosthetic fractures or acromial fractures noted.    PATIENT SURVEYS:  Quick Dash 61.4%  COGNITION: Overall cognitive status: Within functional limits for tasks assessed     SENSATION: Reports pins/needles at night in R hand  POSTURE: Forward head, rounded shoulders.   UPPER EXTREMITY ROM:   Active ROM Right eval Left eval  Shoulder flexion 60 135  Shoulder extension 35 45  Shoulder abduction 45 85  Shoulder adduction    Shoulder internal rotation 50 F- to ear 65 F- back of head   Shoulder external rotation 10 F-to hip 70 F-small of back  (Blank rows = not tested)  UPPER EXTREMITY MMT:  MMT Right eval Left eval  Shoulder flexion 2 5  Shoulder extension 2   Shoulder abduction 2 5  Shoulder internal rotation 2 5  Shoulder external rotation 2 5  Elbow flexion 4 5  Elbow extension 4p! 5  Wrist flexion    Wrist extension    Grip strength (lbs)    (Blank rows = not tested)  JOINT MOBILITY TESTING:  Decreased mobility all directions in [facility] joint  PALPATION:   Tenderness posterior R shoulder.   TODAY'S TREATMENT:                                                                                                                                         DATE:  11/05/23 Therapeutic Exercise: to improve strength and mobility.  Demo, verbal and tactile cues throughout for technique. AAROM flexion with green pball 2 x 5 Scap  retraction x 15 Supine shoulder flexion AA with dowel 2x5 PROM to R shoulder very gentle all directions, very limited ROM esp ER Seated shoulder swifer flexion x 5; scaption x 5 Seated ER AA with dowel x 5- increased pain after Manual Therapy: to decrease muscle spasm, pain and improve mobility.  STM to R anterior, lateral, posterior deltoids  10/29/23 EVAL Therapeutic Exercise: to improve strength and mobility.  Demo, verbal and tactile cues throughout for technique. Isometric shoulder exercises - flexion, extension, IR, ER was painful today Table slides - cues to use body for AAROM AAROM flexion with swiffer.  Self Care: Findings, POC, importance of moving and strengthening shoulder but to tolerance.    PATIENT EDUCATION: Education details: findings, POC, initial HEP Person educated: Patient Education method: Explanation, Demonstration, Verbal cues, and Handouts Education comprehension: verbalized understanding and returned demonstration  HOME EXERCISE PROGRAM: Access Code: [email or web address] URL: [email or web address] Date: 10/29/2023 Prepared by: Almarie Sprinkles  Exercises - Seated Shoulder Flexion Towel Slide at Table Top  - 1 x daily - 7 x weekly - 1 sets - 10 reps - Seated Scapular Retraction  - 1 x daily - 7 x weekly - 1 sets - 5 reps - 5 sec hold - Isometric Shoulder Adduction  - 3 x daily - 7 x weekly - 1 sets - 5 reps - 5 sec hold - Isometric Shoulder External Rotation  - 3 x daily - 7 x weekly - 1 sets - 5 reps - 5 hold - Isometric Shoulder Internal Rotation  - 3 x daily - 7 x weekly - 1 sets - 5 reps - 5 hold - Isometric Shoulder Flexion  - 3 x daily - 7 x weekly - 1 sets - 5 reps - 5 hold - Seated Isometric Shoulder Extension  - 3 x daily - 7 x weekly - 1 sets - 5 reps - 5 hold - Seated Shoulder Flexion Extension AAROM with Dowel into Wall  - 3 x daily - 7 x weekly - 1 sets - 10 reps  ASSESSMENT:  CLINICAL IMPRESSION: Focused skilled interventions on  shoulder muscle recruitment along with gentle P/AAROM movements to restore functional shoulder ROM. Pt is very limited OH reaching d/t pain, unable to do pulleys for warm up. He did show increased movement after PROM and STM. C Hollomon would benefit from skilled therapy to improve Right shoulder ROM, decrease pain and improve functional mobility and ability to perform ADLs.   OBJECTIVE IMPAIRMENTS: decreased activity tolerance, decreased mobility, decreased ROM, decreased strength, hypomobility, increased fascial restrictions, increased muscle spasms, impaired flexibility, impaired sensation, impaired UE functional use, postural dysfunction, and pain.   ACTIVITY LIMITATIONS: carrying, lifting, bending, sleeping, bed mobility, bathing, dressing, reach over head, and hygiene/grooming  PARTICIPATION LIMITATIONS: meal prep, cleaning, laundry, driving, shopping, community activity, and yard work  PERSONAL FACTORS: Age, Past/current experiences, Time since onset of injury/illness/exacerbation, and 3+ comorbidities: Anxiety, depression, history of arthritis/cervical disk disease, CAD, COPD, dyspnea on exertion, prostate cancer, squamous cell cancer of tongue, history of stroke, R RTC repair, R carotid stenosis, back pain.   are also affecting patient's functional outcome.   REHAB POTENTIAL: Good  CLINICAL DECISION MAKING: Evolving/moderate complexity  EVALUATION COMPLEXITY: Moderate  GOALS: Goals reviewed with patient? Yes  SHORT TERM GOALS: Target date: 11/26/2023   Patient will be independent with initial HEP.  Baseline: given Goal status: INITIAL  2.  Patient will reports pain < 3/10 with ROM Baseline: 7-8/10 Goal status: INITIAL  3.  Patient will  demonstrate ROM to 130 deg forward elevation and 30 deg ER  Baseline: 60 deg ff, 10 deg ER Goal status: INITIAL   LONG TERM GOALS: Target date: 12/24/2023   Patient will be independent with advanced/ongoing HEP to improve outcomes and  carryover.  Baseline:  Goal status: INITIAL  2.  Patient will report 75% improvement in Right shoulder pain to improve QOL.  Baseline:  Goal status: INITIAL  3.   Patient to improve Right shoulder AROM to [facility] without pain provocation to allow for increased ease of ADLs.  Baseline: see objective Goal status: INITIAL  4.  Patient will demonstrate improved functional UE strength as demonstrated by 4+/5 R shoulder strength. Baseline: see objective Goal status: INITIAL  5.  Patient will report 10 points improvement on QuickDash to demonstrate improved functional ability.  Baseline: 61.4% impairment Goal status: INITIAL  6.  Patient will be able to be able to lay with arms overhead comfortably to undergo radiation treatment.    Baseline: unable Goal status: INITIAL   PLAN:  PT FREQUENCY: 2x/week  PT DURATION: 8 weeks  PLANNED INTERVENTIONS: 97110-Therapeutic exercises, 97530- Therapeutic activity, [id number]- Neuromuscular re-education, 97535- Self Care, [id number]- Manual therapy, [id number]- Electrical stimulation (unattended), [id number]- Electrical stimulation (manual), 97016- Vasopneumatic device, [id number]- Ultrasound, [id number]- Ionotophoresis 4mg /ml Dexamethasone , Patient/Family education, Taping, Dry Needling, Joint mobilization, Joint manipulation, Spinal manipulation, Spinal mobilization, Cryotherapy, and Moist heat  PLAN FOR NEXT SESSION: gentle mobilization for ROM, progress per protocol and tolerance, DOS 09/06/2023, 8 weeks post op as of 11/01/2023   Matthieu Loftus L Sondos Wolfman, PTA 11/05/2023, 11:13 AM

## 2023-11-06 ENCOUNTER — Other Ambulatory Visit (HOSPITAL_BASED_OUTPATIENT_CLINIC_OR_DEPARTMENT_OTHER): Payer: Self-pay

## 2023-11-07 ENCOUNTER — Ambulatory Visit: Payer: Medicare Other

## 2023-11-07 DIAGNOSIS — M25511 Pain in right shoulder: Secondary | ICD-10-CM

## 2023-11-07 DIAGNOSIS — M25611 Stiffness of right shoulder, not elsewhere classified: Secondary | ICD-10-CM

## 2023-11-07 DIAGNOSIS — M6281 Muscle weakness (generalized): Secondary | ICD-10-CM

## 2023-11-07 NOTE — Therapy (Signed)
 OUTPATIENT PHYSICAL THERAPY SHOULDER TREATMENT   Patient Name: [name] MRN: [id number] DOB:[birth year], 73 y.o., male Today's Date: 11/07/2023  END OF SESSION:     Past Medical History:  Diagnosis Date   ADHD (attention deficit hyperactivity disorder)    Anxiety    Arthritis    NECK, HANDS, KNEES (06/14/2016)   Atypical mole 08/07/2021   Mid Back (Atypical Proliferation)   CAD (coronary artery disease)    Carotid artery stenosis    Complication of anesthesia    Reports that he had trouble swallowing post anesthesia- CABG   COPD (chronic obstructive pulmonary disease) (HCC) 12/11/2011   r/s mv - EF 72%; exercise capacity 13 METS; no exercised induced ischemic EKG changes   Depression, major    Dyspnea    with exertion   GERD (gastroesophageal reflux disease)    HTN (hypertension), benign 04/16/2008   echo - EF >55%; no regional wall or valvular abnormalities   Hyperlipidemia    statin intolerant   Hypothyroidism    Migraine    crippling headaches as a kid; silent migraines now, no pain, couple times/week (06/14/2016)   Peripheral vascular disease (HCC)    Prostate cancer (HCC) 04/2019   recent dx - Dr Renay Moats   S/P angioplasty with stent 06/14/16 PCI & DES of high grade AV groove LCX and 1st OM  06/15/2016   Squamous cell cancer of tongue (HCC) ~ 2002   35 [facility] treatments at [facility]   Stroke [facility])    hx Thalamic Ischemic CVA   Subclavian steal syndrome 06/17/2012   carotid doppler - R systolic brachial pressure , L ; R subclavian artery - proxmial obsstruction w/ abnormal monophasic waveforms, R ECA known occlusive disease; L ECA narrowing w/ 70-99% diameter reductiona   TIA (transient ischemic attack) 2005   Viral hepatitis 1970s   non specific   Past Surgical History:  Procedure Laterality Date   BACK SURGERY     BIOPSY  02/20/2018   Procedure: BIOPSY;  Surgeon: Kristie Lamprey, MD;  Location: [facility];  Service: Endoscopy;;    BIOPSY TONGUE  ~ 2002   base of tongue and uvula   CARDIAC CATHETERIZATION  04/26/2008   L circumflex 99% stenosed midportion straddling a marginal branch; RCA total priximally w/ grade 2 L to R collaterals; renal arteries widely patent; LAD 30% segmental proximal stenosis   CARDIAC CATHETERIZATION N/A 05/14/2016   Procedure: Left Heart Cath and Cors/Grafts Angiography;  Surgeon: Dorn JINNY Lesches, MD;  Location: [facility] INV[facility];  Service: Cardiovascular;  Laterality: N/A;   CARDIAC CATHETERIZATION N/A 06/14/2016   Procedure: Coronary Stent Intervention;  Surgeon: Dorn JINNY Lesches, MD;  Location: [facility] INV[facility] CV [facility];  Service: Cardiovascular;  Laterality: N/A;   CERVICAL DISC SURGERY  2000   Kritzer    Posterior approach   COLONOSCOPY WITH PROPOFOL  N/A 02/20/2018   Procedure: COLONOSCOPY WITH PROPOFOL ;  Surgeon: Kristie Lamprey, MD;  Location: [facility];  Service: Endoscopy;  Laterality: N/A;   CORONARY ANGIOPLASTY WITH STENT PLACEMENT     CORONARY ARTERY BYPASS GRAFT  2009   x5; LIMA to LAd, sequential vein to first diagonal branch, first and second obtuse marginal branches; vein to PDA   ESOPHAGOGASTRODUODENOSCOPY (EGD) WITH PROPOFOL  N/A 02/20/2018   Procedure: ESOPHAGOGASTRODUODENOSCOPY (EGD) WITH PROPOFOL ;  Surgeon: Kristie Lamprey, MD;  Location: [facility];  Service: Endoscopy;  Laterality: N/A;   INSERTION OF MESH N/A 10/29/2017   Procedure: INSERTION OF MESH;  Surgeon: Belinda Cough, MD;  Location: [facility] OR;  Service: General;  Laterality: N/A;   LUMBAR DISC SURGERY  06/17/2019   LUMBAR 2 & 3    LUMBAR LAMINECTOMY/DECOMPRESSION MICRODISCECTOMY N/A 06/17/2019   Procedure: Lumbar two -Lumbar three decompression and disectomy;  Surgeon: Burnetta Aures, MD;  Location: [facility] OR;  Service: Orthopedics;  Laterality: N/A;  3 hrs   POLYPECTOMY  02/20/2018   Procedure: POLYPECTOMY;  Surgeon: Kristie Lamprey, MD;  Location: [facility];  Service: Endoscopy;;   REVERSE SHOULDER ARTHROPLASTY Right  09/06/2023   Procedure: REVERSE SHOULDER ARTHROPLASTY;  Surgeon: Kay Kemps, MD;  Location: [facility] ORS;  Service: Orthopedics;  Laterality: Right;  interscalene  100   SHOULDER ARTHROSCOPY WITH SUBACROMIAL DECOMPRESSION AND OPEN ROTATOR C Left 09/28/2013   Procedure: LEFT SHOULDER ARTHROSCOPY WITH SUBACROMIAL DECOMPRESSION AND MINI OPEN ROTATOR CUFF REPAIR, OPEN DISTAL CLAVICLE RESECTION AND OPEN BICEP TENDODESIS ;  Surgeon: Elspeth JONELLE Kay, MD;  Location: [facility] OR;  Service: Orthopedics;  Laterality: Left;   TRANSCAROTID ARTERY REVASCULARIZATION  Right 05/05/2021   Procedure: RIGHT TRANSCAROTID ARTERY REVASCULARIZATION;  Surgeon: Magda Debby SAILOR, MD;  Location: [facility] OR;  Service: Vascular;  Laterality: Right;   UMBILICAL HERNIA REPAIR N/A 10/29/2017   Procedure: UMBILICAL HERNIA REPAIR;  Surgeon: Belinda Cough, MD;  Location: [facility] OR;  Service: General;  Laterality: N/A;   Patient Active Problem List   Diagnosis Date Noted   Carotid stenosis, right 05/05/2021   Former smoker 03/24/2020   SOB (shortness of breath) 03/24/2020   Lumbar disc herniation 06/17/2019   Right lumbar radiculopathy 05/16/2019   Elevated PSA 05/16/2019   S/P angioplasty with stent 06/14/16 PCI & DES of high grade AV groove LCX and 1st OM  06/15/2016   CAD (coronary artery disease) 06/14/2016   Abnormal stress test    Carotid artery disease (HCC) 05/20/2013   Hypothyroidism 03/29/2012   Erectile dysfunction 07/02/2011   Anxiety 07/02/2011   Back pain 07/02/2011   Cervical disc disease 07/02/2011   Coronary artery disease 04/29/2011   Squamous cell carcinoma of tongue (HCC) 04/29/2011   Hypertension 04/29/2011   Depression 04/29/2011   GE reflux 04/29/2011   Internal carotid artery stenosis 04/29/2011   Ptosis of eyelid, left 04/29/2011   Hyperlipidemia 04/29/2011   History of ischemic vertebrobasilar artery thalamic stroke 04/29/2011   Attention deficit disorder 04/29/2011   Degenerative joint disease of cervical  spine 04/29/2011    PCP: Perri Ronal PARAS, MD    REFERRING PROVIDER: Kay Kemps, MD  REFERRING DIAG:  Z47.89 (ICD-10-CM) - Orthopedic aftercare  [phone] (ICD-10-CM) - S/p reverse total shoulder arthroplasty    THERAPY DIAG:  Acute pain of right shoulder  Stiffness of right shoulder, not elsewhere classified  Muscle weakness (generalized)  Rationale for Evaluation and Treatment: Rehabilitation  ONSET DATE: DOS R shoulder rTSA 09/06/2023  SUBJECTIVE:  SUBJECTIVE STATEMENT: Pt reports that he has been doing HEP, table slides are better. Hand dominance: Left  NEXT MD VISIT:  December 04, 2023  PERTINENT HISTORY: Anxiety, depression, history of arthritis/cervical disk disease, CAD, COPD, dyspnea on exertion, prostate cancer, squamous cell cancer of tongue, history of stroke, R RTC repair, R carotid stenosis, back pain.   PAIN:  Are you having pain? Yes: NPRS scale: 7/10 Pain location: R shoulder  Pain description: sharp, achey Aggravating factors: moving wrong, sleeping on it it. Relieving factors: Percocet, muscle relaxers  PRECAUTIONS: None  RED FLAGS: None   WEIGHT BEARING RESTRICTIONS: No  FALLS:  Has patient fallen in last 6 months?  Occasionally tripping over the dogs.   LIVING ENVIRONMENT: Lives with: lives with their spouse Lives in: House/apartment Stairs: Yes: Internal: 14 steps; on left going up and External: 2 steps; on left going up Has following equipment at home: None  OCCUPATION: retired  PLOF: Independent and Leisure: woodworking  PATIENT GOALS: getting back to doing stuff and not being miserable.  Also has a couple of nodules in lungs, needs to be able to have arms up.   OBJECTIVE:   DIAGNOSTIC FINDINGS:  10/22/2023 Imaging: AP and scapular Y views of the right  shoulder show a well-fixed and well-positioned right reverse TSA. No periprosthetic fractures or acromial fractures noted.    PATIENT SURVEYS:  Quick Dash 61.4%  COGNITION: Overall cognitive status: Within functional limits for tasks assessed     SENSATION: Reports pins/needles at night in R hand  POSTURE: Forward head, rounded shoulders.   UPPER EXTREMITY ROM:   Active ROM Right eval Left eval R 11/07/23  Shoulder flexion 60 135 100  Shoulder extension 35 45 35  Shoulder abduction 45 85 94  Shoulder adduction     Shoulder internal rotation 50 F- to ear 65 F- back of head  FIR- to glutes  Shoulder external rotation 10 F-to hip 70 F-small of back FER- to UT muscle  (Blank rows = not tested)  UPPER EXTREMITY MMT:  MMT Right eval Left eval  Shoulder flexion 2 5  Shoulder extension 2   Shoulder abduction 2 5  Shoulder internal rotation 2 5  Shoulder external rotation 2 5  Elbow flexion 4 5  Elbow extension 4p! 5  Wrist flexion    Wrist extension    Grip strength (lbs)    (Blank rows = not tested)  JOINT MOBILITY TESTING:  Decreased mobility all directions in [facility] joint  PALPATION:   Tenderness posterior R shoulder.   TODAY'S TREATMENT:                                                                                                                                         DATE: 11/07/23  Therapeutic Exercise: to improve strength and mobility.  Demo, verbal and tactile cues throughout for technique.  R shoulder PROM flex, abd, ER  gentle stretching Supine shoulder flexion with wand x 5 Supine chest press with wand x 10 Supine R ER with wand x 10 Supine R shoulder abduction with wand x 10 Seated R shoulder AA flexion with cane x 10 Pulleys review  11/05/23 Therapeutic Exercise: to improve strength and mobility.  Demo, verbal and tactile cues throughout for technique. AAROM flexion with green pball 2 x 5 Scap retraction x 15 Supine shoulder flexion AA with dowel  2x5 PROM to R shoulder very gentle all directions, very limited ROM esp ER Seated shoulder swifer flexion x 5; scaption x 5 Seated ER AA with dowel x 5- increased pain after Manual Therapy: to decrease muscle spasm, pain and improve mobility.  STM to R anterior, lateral, posterior deltoids  10/29/23 EVAL Therapeutic Exercise: to improve strength and mobility.  Demo, verbal and tactile cues throughout for technique. Isometric shoulder exercises - flexion, extension, IR, ER was painful today Table slides - cues to use body for AAROM AAROM flexion with swiffer.  Self Care: Findings, POC, importance of moving and strengthening shoulder but to tolerance.    PATIENT EDUCATION: Education details: findings, POC, initial HEP Person educated: Patient Education method: Explanation, Demonstration, Verbal cues, and Handouts Education comprehension: verbalized understanding and returned demonstration  HOME EXERCISE PROGRAM: Access Code: [email or web address] URL: [email or web address] Date: 11/07/2023 Prepared by: Sol Gaskins  Exercises - Seated Shoulder Flexion Towel Slide at Table Top  - 1 x daily - 7 x weekly - 1 sets - 10 reps - Seated Scapular Retraction  - 1 x daily - 7 x weekly - 1 sets - 5 reps - 5 sec hold - Isometric Shoulder Adduction  - 3 x daily - 7 x weekly - 1 sets - 5 reps - 5 sec hold - Isometric Shoulder External Rotation  - 3 x daily - 7 x weekly - 1 sets - 5 reps - 5 hold - Isometric Shoulder Internal Rotation  - 3 x daily - 7 x weekly - 1 sets - 5 reps - 5 hold - Isometric Shoulder Flexion  - 3 x daily - 7 x weekly - 1 sets - 5 reps - 5 hold - Seated Isometric Shoulder Extension  - 3 x daily - 7 x weekly - 1 sets - 5 reps - 5 hold - Seated Shoulder Flexion Extension AAROM with Dowel into Wall  - 3 x daily - 7 x weekly - 1 sets - 10 reps - Supine Shoulder Flexion Extension AAROM with Dowel  - 1 x daily - 7 x weekly - 2 sets - 10 reps - Supine Shoulder Scaption with Dowel   - 1 x daily - 7 x weekly - 2 sets - 10 reps - Supine Shoulder External Rotation with Dowel  - 1 x daily - 7 x weekly - 2 sets - 10 reps  ASSESSMENT:  CLINICAL IMPRESSION: Pt is showing great improvement with flexion and abduction ROM. He demonstrated less pain with shoulder ROM exercises today. Started with gentle stretching/PROM which helped to loosen the shoulder. Continued with AAROM exercises to restore ROM and for gentle muscle activation. Pulleys are still taxing on his shoulder, so may not be able to tolerate much of that. [name] would benefit from skilled therapy to improve Right shoulder ROM, decrease pain and improve functional mobility and ability to perform ADLs.   OBJECTIVE IMPAIRMENTS: decreased activity tolerance, decreased mobility, decreased ROM, decreased strength, hypomobility, increased fascial restrictions, increased muscle spasms, impaired flexibility, impaired sensation, impaired UE functional  use, postural dysfunction, and pain.   ACTIVITY LIMITATIONS: carrying, lifting, bending, sleeping, bed mobility, bathing, dressing, reach over head, and hygiene/grooming  PARTICIPATION LIMITATIONS: meal prep, cleaning, laundry, driving, shopping, community activity, and yard work  PERSONAL FACTORS: Age, Past/current experiences, Time since onset of injury/illness/exacerbation, and 3+ comorbidities: Anxiety, depression, history of arthritis/cervical disk disease, CAD, COPD, dyspnea on exertion, prostate cancer, squamous cell cancer of tongue, history of stroke, R RTC repair, R carotid stenosis, back pain.   are also affecting patient's functional outcome.   REHAB POTENTIAL: Good  CLINICAL DECISION MAKING: Evolving/moderate complexity  EVALUATION COMPLEXITY: Moderate  GOALS: Goals reviewed with patient? Yes  SHORT TERM GOALS: Target date: 11/26/2023   Patient will be independent with initial HEP.  Baseline: given Goal status: INITIAL  2.  Patient will reports pain <  3/10 with ROM Baseline: 7-8/10 Goal status: INITIAL  3.  Patient will demonstrate ROM to 130 deg forward elevation and 30 deg ER  Baseline: 60 deg ff, 10 deg ER Goal status: INITIAL   LONG TERM GOALS: Target date: 12/24/2023   Patient will be independent with advanced/ongoing HEP to improve outcomes and carryover.  Baseline:  Goal status: INITIAL  2.  Patient will report 75% improvement in Right shoulder pain to improve QOL.  Baseline:  Goal status: INITIAL  3.   Patient to improve Right shoulder AROM to [facility] without pain provocation to allow for increased ease of ADLs.  Baseline: see objective Goal status: INITIAL  4.  Patient will demonstrate improved functional UE strength as demonstrated by 4+/5 R shoulder strength. Baseline: see objective Goal status: INITIAL  5.  Patient will report 10 points improvement on QuickDash to demonstrate improved functional ability.  Baseline: 61.4% impairment Goal status: INITIAL  6.  Patient will be able to be able to lay with arms overhead comfortably to undergo radiation treatment.    Baseline: unable Goal status: INITIAL   PLAN:  PT FREQUENCY: 2x/week  PT DURATION: 8 weeks  PLANNED INTERVENTIONS: 97110-Therapeutic exercises, 97530- Therapeutic activity, [id number]- Neuromuscular re-education, 97535- Self Care, [id number]- Manual therapy, 97014- Electrical stimulation (unattended), [phone]- Electrical stimulation (manual), 97016- Vasopneumatic device, [id number]- Ultrasound, [id number]- Ionotophoresis 4mg /ml Dexamethasone , Patient/Family education, Taping, Dry Needling, Joint mobilization, Joint manipulation, Spinal manipulation, Spinal mobilization, Cryotherapy, and Moist heat  PLAN FOR NEXT SESSION: gentle mobilization for ROM, progress per protocol and tolerance, DOS 09/06/2023, 8 weeks post op as of 11/01/2023   Sol LITTIE Gaskins, PTA 11/07/2023, 11:51 AM

## 2023-11-12 ENCOUNTER — Ambulatory Visit: Payer: Medicare Other | Admitting: Physical Therapy

## 2023-11-12 ENCOUNTER — Encounter: Payer: Self-pay | Admitting: Physical Therapy

## 2023-11-12 DIAGNOSIS — M25511 Pain in right shoulder: Secondary | ICD-10-CM

## 2023-11-12 DIAGNOSIS — M6281 Muscle weakness (generalized): Secondary | ICD-10-CM

## 2023-11-12 DIAGNOSIS — M25611 Stiffness of right shoulder, not elsewhere classified: Secondary | ICD-10-CM

## 2023-11-12 NOTE — Therapy (Signed)
OUTPATIENT PHYSICAL THERAPY SHOULDER TREATMENT   Patient Name: [name] MRN: [id number] DOB:Sep 20, 1951, 73 y.o., male Today's Date: 11/12/2023  END OF SESSION:  PT End of Session - 11/12/23 1018     Visit Number 4    Date for PT Re-Evaluation 12/24/23    Authorization Type Medicare + [employer] Supplement    Progress Note Due on Visit 10    PT Start Time 1018    PT Stop Time 1104    PT Time Calculation (min) 46 min    Activity Tolerance Patient tolerated treatment well;Patient limited by pain    Behavior During Therapy WFL for tasks assessed/performed               Past Medical History:  Diagnosis Date   ADHD (attention deficit hyperactivity disorder)    Anxiety    Arthritis    "NECK, HANDS, KNEES" (06/14/2016)   Atypical mole 08/07/2021   Mid Back (Atypical Proliferation)   CAD (coronary artery disease)    Carotid artery stenosis    Complication of anesthesia    Reports that he had trouble swallowing post anesthesia- CABG   COPD (chronic obstructive pulmonary disease) (HCC) 12/11/2011   r/s mv - EF 72%; exercise capacity 13 METS; no exercised induced ischemic EKG changes   Depression, major    Dyspnea    with exertion   GERD (gastroesophageal reflux disease)    HTN (hypertension), benign 04/16/2008   echo - EF >55%; no regional wall or valvular abnormalities   Hyperlipidemia    statin intolerant   Hypothyroidism    Migraine    "crippling headaches as a kid; silent migraines now, no pain, couple times/week" (06/14/2016)   Peripheral vascular disease (HCC)    Prostate cancer (HCC) 04/2019   recent dx - Dr [name]   S/P angioplasty with stent 06/14/16 PCI & DES of high grade AV groove LCX and 1st OM  06/15/2016   Squamous cell cancer of tongue (HCC) ~ 2002   "35 radiation treatments at [facility]"   Stroke [facility])    hx Thalamic Ischemic CVA   Subclavian steal syndrome 06/17/2012   carotid doppler - R systolic brachial pressure , L ; R  subclavian artery - proxmial obsstruction w/ abnormal monophasic waveforms, R ECA known occlusive disease; L ECA narrowing w/ 70-99% diameter reductiona   TIA (transient ischemic attack) 2005   Viral hepatitis 1970s   "non specific"   Past Surgical History:  Procedure Laterality Date   BACK SURGERY     BIOPSY  02/20/2018   Procedure: BIOPSY;  Surgeon: Charna Lameka Disla, MD;  Location: [facility];  Service: Endoscopy;;   BIOPSY TONGUE  ~ 2002   "base of tongue and uvula"   CARDIAC CATHETERIZATION  04/26/2008   L circumflex 99% stenosed midportion straddling a marginal branch; RCA total priximally w/ grade 2 L to R collaterals; renal arteries widely patent; LAD 30% segmental proximal stenosis   CARDIAC CATHETERIZATION N/A 05/14/2016   Procedure: Left Heart Cath and Cors/Grafts Angiography;  Surgeon: [name], MD;  Location: [facility];  Service: Cardiovascular;  Laterality: N/A;   CARDIAC CATHETERIZATION N/A 06/14/2016   Procedure: Coronary Stent Intervention;  Surgeon: [name], MD;  Location: [facility] CV [facility];  Service: Cardiovascular;  Laterality: N/A;   CERVICAL DISC SURGERY  2000   Kritzer    Posterior approach   COLONOSCOPY WITH PROPOFOL N/A 02/20/2018   Procedure: COLONOSCOPY WITH PROPOFOL;  Surgeon: Charna Alyene Predmore, MD;  Location: [facility];  Service: Endoscopy;  Laterality: N/A;   CORONARY ANGIOPLASTY WITH STENT PLACEMENT     CORONARY ARTERY BYPASS GRAFT  2009   x5; LIMA to LAd, sequential vein to first diagonal branch, first and second obtuse marginal branches; vein to PDA   ESOPHAGOGASTRODUODENOSCOPY (EGD) WITH PROPOFOL N/A 02/20/2018   Procedure: ESOPHAGOGASTRODUODENOSCOPY (EGD) WITH PROPOFOL;  Surgeon: Charna Damaso Laday, MD;  Location: [facility];  Service: Endoscopy;  Laterality: N/A;   INSERTION OF MESH N/A 10/29/2017   Procedure: INSERTION OF MESH;  Surgeon: [name], MD;  Location: [facility] OR;  Service: General;  Laterality: N/A;   LUMBAR DISC SURGERY   06/17/2019   LUMBAR 2 & 3    LUMBAR LAMINECTOMY/DECOMPRESSION MICRODISCECTOMY N/A 06/17/2019   Procedure: Lumbar two -Lumbar three decompression and disectomy;  Surgeon: [name], MD;  Location: [facility] OR;  Service: Orthopedics;  Laterality: N/A;  3 hrs   POLYPECTOMY  02/20/2018   Procedure: POLYPECTOMY;  Surgeon: Charna Mayo Faulk, MD;  Location: [facility];  Service: Endoscopy;;   REVERSE SHOULDER ARTHROPLASTY Right 09/06/2023   Procedure: REVERSE SHOULDER ARTHROPLASTY;  Surgeon: [name], MD;  Location: [facility] ORS;  Service: Orthopedics;  Laterality: Right;  interscalene  100   SHOULDER ARTHROSCOPY WITH SUBACROMIAL DECOMPRESSION AND OPEN ROTATOR C Left 09/28/2013   Procedure: LEFT SHOULDER ARTHROSCOPY WITH SUBACROMIAL DECOMPRESSION AND MINI OPEN ROTATOR CUFF REPAIR, OPEN DISTAL CLAVICLE RESECTION AND OPEN BICEP TENDODESIS ;  Surgeon: [name], MD;  Location: [facility] OR;  Service: Orthopedics;  Laterality: Left;   TRANSCAROTID ARTERY REVASCULARIZATION  Right 05/05/2021   Procedure: RIGHT TRANSCAROTID ARTERY REVASCULARIZATION;  Surgeon: [name], MD;  Location: [facility];  Service: Vascular;  Laterality: Right;   UMBILICAL HERNIA REPAIR N/A 10/29/2017   Procedure: UMBILICAL HERNIA REPAIR;  Surgeon: [name], MD;  Location: [facility] OR;  Service: General;  Laterality: N/A;   Patient Active Problem List   Diagnosis Date Noted   Carotid stenosis, right 05/05/2021   Former smoker 03/24/2020   SOB (shortness of breath) 03/24/2020   Lumbar disc herniation 06/17/2019   Right lumbar radiculopathy 05/16/2019   Elevated PSA 05/16/2019   S/P angioplasty with stent 06/14/16 PCI & DES of high grade AV groove LCX and 1st OM  06/15/2016   CAD (coronary artery disease) 06/14/2016   Abnormal stress test    Carotid artery disease (HCC) 05/20/2013   Hypothyroidism 03/29/2012   Erectile dysfunction 07/02/2011   Anxiety 07/02/2011   Back pain 07/02/2011   Cervical disc disease 07/02/2011   Coronary  artery disease 04/29/2011   Squamous cell carcinoma of tongue (HCC) 04/29/2011   Hypertension 04/29/2011   Depression 04/29/2011   GE reflux 04/29/2011   Internal carotid artery stenosis 04/29/2011   Ptosis of eyelid, left 04/29/2011   Hyperlipidemia 04/29/2011   History of ischemic vertebrobasilar artery thalamic stroke 04/29/2011   Attention deficit disorder 04/29/2011   Degenerative joint disease of cervical spine 04/29/2011    PCP: Margaree Mackintosh, MD    REFERRING PROVIDER: [name], MD  REFERRING DIAG:  Z47.89 (ICD-10-CM) - Orthopedic aftercare  [phone] (ICD-10-CM) - S/p reverse total shoulder arthroplasty    THERAPY DIAG:  Acute pain of right shoulder  Stiffness of right shoulder, not elsewhere classified  Muscle weakness (generalized)  Rationale for Evaluation and Treatment: Rehabilitation  ONSET DATE: DOS R shoulder rTSA 09/06/2023  SUBJECTIVE:  SUBJECTIVE STATEMENT: Accompanied by friend today.  Reports pain has not improved.   Doing exercises as tolerated.  Hand dominance: Left  NEXT MD VISIT:  December 04, 2023  PERTINENT HISTORY: Anxiety, depression, history of arthritis/cervical disk disease, CAD, COPD, dyspnea on exertion, prostate cancer, squamous cell cancer of tongue, history of stroke, R RTC repair, R carotid stenosis, back pain.   PAIN:  Are you having pain? Yes: NPRS scale: 7/10 Pain location: R shoulder  Pain description: sharp, achey Aggravating factors: moving wrong, sleeping on it it. Relieving factors: Percocet, muscle relaxers  PRECAUTIONS: None  RED FLAGS: None   WEIGHT BEARING RESTRICTIONS: No  FALLS:  Has patient fallen in last 6 months?  Occasionally tripping over the dogs.   LIVING ENVIRONMENT: Lives with: lives with their spouse Lives in:  House/apartment Stairs: Yes: Internal: 14 steps; on left going up and External: 2 steps; on left going up Has following equipment at home: None  OCCUPATION: retired  PLOF: Independent and Leisure: woodworking  PATIENT GOALS: getting back to doing stuff and not being miserable.  Also has a couple of nodules in lungs, needs to be able to have arms up.   OBJECTIVE:   DIAGNOSTIC FINDINGS:  10/22/2023 Imaging: AP and scapular Y views of the right shoulder show a well-fixed and well-positioned right reverse TSA. No periprosthetic fractures or acromial fractures noted.    PATIENT SURVEYS:  Quick Dash 61.4%  COGNITION: Overall cognitive status: Within functional limits for tasks assessed     SENSATION: Reports pins/needles at night in R hand  POSTURE: Forward head, rounded shoulders.   UPPER EXTREMITY ROM:   Active ROM Right eval Left eval R 11/07/23 R 11/12/23  Shoulder flexion 60 135 100 120  Shoulder extension 35 45 35   Shoulder abduction 45 85 94 90  Shoulder adduction      Shoulder internal rotation 50 F- to ear 65 F- back of head  FIR- to glutes   Shoulder external rotation 10 F-to hip 70 F-small of back FER- to UT muscle 10  (Blank rows = not tested)  UPPER EXTREMITY MMT:  MMT Right eval Left eval  Shoulder flexion 2 5  Shoulder extension 2   Shoulder abduction 2 5  Shoulder internal rotation 2 5  Shoulder external rotation 2 5  Elbow flexion 4 5  Elbow extension 4p! 5  Wrist flexion    Wrist extension    Grip strength (lbs)    (Blank rows = not tested)  JOINT MOBILITY TESTING:  Decreased mobility all directions in [facility] joint  PALPATION:   Tenderness posterior R shoulder.   TODAY'S TREATMENT:                                                                                                                                         DATE:  11/12/23 Therapeutic Exercise: to improve strength and mobility.  Demo, verbal  and tactile cues throughout for  technique. Pulleys x 2 min flexion - reports stabbing pain in R shoulder Table slides flexion - better tolerated Windshield wipers on table - not getting ER, IR only Scapular punches 2 x 10  AAROM shoulder flexion with hands clasped Supine shoulder ER with cane supine Seated ER at table - tolerated well Manual Therapy: to decrease muscle spasm and pain and improve mobility Mobs to [facility] joint all direction, traction and ER to [facility] joint using short lever arm to decrease discomfort, STM/TPR to pectoralis, deltoids.    11/07/23  Therapeutic Exercise: to improve strength and mobility.  Demo, verbal and tactile cues throughout for technique.  R shoulder PROM flex, abd, ER [name] stretching Supine shoulder flexion with wand x 5 Supine chest press with wand x 10 Supine R ER with wand x 10 Supine R shoulder abduction with wand x 10 Seated R shoulder AA flexion with cane x 10 Pulleys review  11/05/23 Therapeutic Exercise: to improve strength and mobility.  Demo, verbal and tactile cues throughout for technique. AAROM flexion with green pball 2 x 5 Scap retraction x 15 Supine shoulder flexion AA with dowel 2x5 PROM to R shoulder very [name] all directions, very limited ROM esp ER Seated shoulder swifer flexion x 5; scaption x 5 Seated ER AA with dowel x 5- increased pain after Manual Therapy: to decrease muscle spasm, pain and improve mobility.  STM to R anterior, lateral, posterior deltoids     PATIENT EDUCATION: Education details: HEP update Person educated: Patient Education method: Explanation, Demonstration, Verbal cues, and Handouts Education comprehension: verbalized understanding and returned demonstration  HOME EXERCISE PROGRAM: Access Code: [email or web address] URL: [email or web address] Date: 11/12/2023 Prepared by: Harrie Foreman  Exercises - Seated Shoulder Flexion Towel Slide at Table Top  - 1 x daily - 7 x weekly - 1 sets - 10 reps - Seated Scapular Retraction  - 1  x daily - 7 x weekly - 1 sets - 5 reps - 5 sec hold - Isometric Shoulder Adduction  - 3 x daily - 7 x weekly - 1 sets - 5 reps - 5 sec hold - Isometric Shoulder External Rotation  - 3 x daily - 7 x weekly - 1 sets - 5 reps - 5 hold - Isometric Shoulder Internal Rotation  - 3 x daily - 7 x weekly - 1 sets - 5 reps - 5 hold - Isometric Shoulder Flexion  - 3 x daily - 7 x weekly - 1 sets - 5 reps - 5 hold - Seated Isometric Shoulder Extension  - 3 x daily - 7 x weekly - 1 sets - 5 reps - 5 hold - Seated Shoulder Flexion Extension AAROM with Dowel into Wall  - 3 x daily - 7 x weekly - 1 sets - 10 reps - Supine Shoulder Flexion Extension AAROM with Dowel  - 1 x daily - 7 x weekly - 2 sets - 10 reps - Supine Shoulder Scaption with Dowel  - 1 x daily - 7 x weekly - 2 sets - 10 reps - Supine Shoulder External Rotation with Dowel  - 1 x daily - 7 x weekly - 2 sets - 10 reps - Supine Single Arm Shoulder Protraction  - 1 x daily - 7 x weekly - 2 sets - 10 reps - Supine Shoulder Flexion AAROM with Hands Clasped  - 1 x daily - 7 x weekly - 2 sets - 10 reps - Seated Shoulder External Rotation PROM on  Table  - 1 x daily - 7 x weekly - 1 sets - 5 reps - 15 sec hold  ASSESSMENT:  CLINICAL IMPRESSION: ANKUR SNOWDON continues to demonstrate improvements with shoulder flexion and abduction, but still extremely limited with ER to only 10 deg, with significant pain with external rotation, even after shoulder mobilization today.  He was able to tolerate [name] table stretch into ER better.   Lyn Henri would benefit from skilled therapy to improve Right shoulder ROM, decrease pain and improve functional mobility and ability to perform ADLs.   OBJECTIVE IMPAIRMENTS: decreased activity tolerance, decreased mobility, decreased ROM, decreased strength, hypomobility, increased fascial restrictions, increased muscle spasms, impaired flexibility, impaired sensation, impaired UE functional use, postural dysfunction, and  pain.   ACTIVITY LIMITATIONS: carrying, lifting, bending, sleeping, bed mobility, bathing, dressing, reach over head, and hygiene/grooming  PARTICIPATION LIMITATIONS: meal prep, cleaning, laundry, driving, shopping, community activity, and yard work  PERSONAL FACTORS: Age, Past/current experiences, Time since onset of injury/illness/exacerbation, and 3+ comorbidities: Anxiety, depression, history of arthritis/cervical disk disease, CAD, COPD, dyspnea on exertion, prostate cancer, squamous cell cancer of tongue, history of stroke, R RTC repair, R carotid stenosis, back pain.   are also affecting patient's functional outcome.   REHAB POTENTIAL: Good  CLINICAL DECISION MAKING: Evolving/moderate complexity  EVALUATION COMPLEXITY: Moderate  GOALS: Goals reviewed with patient? Yes  SHORT TERM GOALS: Target date: 11/26/2023   Patient will be independent with initial HEP.  Baseline: given Goal status: MET 11/12/23 met  2.  Patient will reports pain < 3/10 with ROM Baseline: 7-8/10 Goal status: IN PROGRESS   3.  Patient will demonstrate ROM to 130 deg forward elevation and 30 deg ER  Baseline: 60 deg ff, 10 deg ER Goal status: IN PROGRESS 11/12/23 120 flexion, 10 deg ER   LONG TERM GOALS: Target date: 12/24/2023   Patient will be independent with advanced/ongoing HEP to improve outcomes and carryover.  Baseline:  Goal status: IN PROGRESS  2.  Patient will report 75% improvement in Right shoulder pain to improve QOL.  Baseline:  Goal status: IN PROGRESS  3.   Patient to improve Right shoulder AROM to [facility] without pain provocation to allow for increased ease of ADLs.  Baseline: see objective Goal status: IN PROGRESS  4.  Patient will demonstrate improved functional UE strength as demonstrated by 4+/5 R shoulder strength. Baseline: see objective Goal status: IN PROGRESS  5.  Patient will report 10 points improvement on QuickDash to demonstrate improved functional ability.  Baseline:  61.4% impairment Goal status: IN PROGRESS  6.  Patient will be able to be able to lay with arms overhead comfortably to undergo radiation treatment.    Baseline: unable Goal status: IN PROGRESS   PLAN:  PT FREQUENCY: 2x/week  PT DURATION: 8 weeks  PLANNED INTERVENTIONS: 97110-Therapeutic exercises, 97530- Therapeutic activity, [id number]- Neuromuscular re-education, 97535- Self Care, 16109- Manual therapy, 97014- Electrical stimulation (unattended), [phone]- Electrical stimulation (manual), 97016- Vasopneumatic device, [id number]- Ultrasound, [id number]- Ionotophoresis 4mg /ml Dexamethasone, Patient/Family education, Taping, Dry Needling, Joint mobilization, Joint manipulation, Spinal manipulation, Spinal mobilization, Cryotherapy, and Moist heat  PLAN FOR NEXT SESSION: [name] mobilization for ROM, progress per protocol and tolerance, DOS 09/06/2023, 10 weeks post op as of 11/15/2023   Jena Gauss, PT 11/12/2023, 1:35 PM

## 2023-11-14 ENCOUNTER — Ambulatory Visit: Payer: Medicare Other

## 2023-11-14 DIAGNOSIS — M25511 Pain in right shoulder: Secondary | ICD-10-CM | POA: Diagnosis not present

## 2023-11-14 DIAGNOSIS — M6281 Muscle weakness (generalized): Secondary | ICD-10-CM

## 2023-11-14 DIAGNOSIS — M25611 Stiffness of right shoulder, not elsewhere classified: Secondary | ICD-10-CM

## 2023-11-14 NOTE — Therapy (Signed)
OUTPATIENT PHYSICAL THERAPY SHOULDER TREATMENT   Patient Name: [name] MRN: [id number] DOB:Dec 17, 1950, 73 y.o., male Today's Date: 11/14/2023  END OF SESSION:  PT End of Session - 11/14/23 1141     Visit Number 5    Date for PT Re-Evaluation 12/24/23    Authorization Type Medicare + [employer] Supplement    Progress Note Due on Visit 10    PT Start Time 1100    PT Stop Time 1140    PT Time Calculation (min) 40 min    Activity Tolerance Patient tolerated treatment well;Patient limited by pain    Behavior During Therapy WFL for tasks assessed/performed                Past Medical History:  Diagnosis Date   ADHD (attention deficit hyperactivity disorder)    Anxiety    Arthritis    "NECK, HANDS, KNEES" (06/14/2016)   Atypical mole 08/07/2021   Mid Back (Atypical Proliferation)   CAD (coronary artery disease)    Carotid artery stenosis    Complication of anesthesia    Reports that he had trouble swallowing post anesthesia- CABG   COPD (chronic obstructive pulmonary disease) (HCC) 12/11/2011   r/s mv - EF 72%; exercise capacity 13 METS; no exercised induced ischemic EKG changes   Depression, major    Dyspnea    with exertion   GERD (gastroesophageal reflux disease)    HTN (hypertension), benign 04/16/2008   echo - EF >55%; no regional wall or valvular abnormalities   Hyperlipidemia    statin intolerant   Hypothyroidism    Migraine    "crippling headaches as a kid; silent migraines now, no pain, couple times/week" (06/14/2016)   Peripheral vascular disease (HCC)    Prostate cancer (HCC) 04/2019   recent dx - Dr [name]   S/P angioplasty with stent 06/14/16 PCI & DES of high grade AV groove LCX and 1st OM  06/15/2016   Squamous cell cancer of tongue (HCC) ~ 2002   "35 radiation treatments at [facility]"   Stroke [facility])    hx Thalamic Ischemic CVA   Subclavian steal syndrome 06/17/2012   carotid doppler - R systolic brachial pressure , L ; R  subclavian artery - proxmial obsstruction w/ abnormal monophasic waveforms, R ECA known occlusive disease; L ECA narrowing w/ 70-99% diameter reductiona   TIA (transient ischemic attack) 2005   Viral hepatitis 1970s   "non specific"   Past Surgical History:  Procedure Laterality Date   BACK SURGERY     BIOPSY  02/20/2018   Procedure: BIOPSY;  Surgeon: [name], MD;  Location: [facility];  Service: Endoscopy;;   BIOPSY TONGUE  ~ 2002   "base of tongue and uvula"   CARDIAC CATHETERIZATION  04/26/2008   L circumflex 99% stenosed midportion straddling a marginal branch; RCA total priximally w/ grade 2 L to R collaterals; renal arteries widely patent; LAD 30% segmental proximal stenosis   CARDIAC CATHETERIZATION N/A 05/14/2016   Procedure: Left Heart Cath and Cors/Grafts Angiography;  Surgeon: [name], MD;  Location: [facility];  Service: Cardiovascular;  Laterality: N/A;   CARDIAC CATHETERIZATION N/A 06/14/2016   Procedure: Coronary Stent Intervention;  Surgeon: [name], MD;  Location: [facility] CV [facility];  Service: Cardiovascular;  Laterality: N/A;   CERVICAL DISC SURGERY  2000   Kritzer    Posterior approach   COLONOSCOPY WITH PROPOFOL N/A 02/20/2018   Procedure: COLONOSCOPY WITH PROPOFOL;  Surgeon: [name], MD;  Location: [facility];  Service: Endoscopy;  Laterality: N/A;   CORONARY ANGIOPLASTY WITH STENT PLACEMENT     CORONARY ARTERY BYPASS GRAFT  2009   x5; LIMA to LAd, sequential vein to first diagonal branch, first and second obtuse marginal branches; vein to PDA   ESOPHAGOGASTRODUODENOSCOPY (EGD) WITH PROPOFOL N/A 02/20/2018   Procedure: ESOPHAGOGASTRODUODENOSCOPY (EGD) WITH PROPOFOL;  Surgeon: [name], MD;  Location: [facility];  Service: Endoscopy;  Laterality: N/A;   INSERTION OF MESH N/A 10/29/2017   Procedure: INSERTION OF MESH;  Surgeon: [name], MD;  Location: [facility] OR;  Service: General;  Laterality: N/A;   LUMBAR DISC SURGERY   06/17/2019   LUMBAR 2 & 3    LUMBAR LAMINECTOMY/DECOMPRESSION MICRODISCECTOMY N/A 06/17/2019   Procedure: Lumbar two -Lumbar three decompression and disectomy;  Surgeon: [name], MD;  Location: [facility] OR;  Service: Orthopedics;  Laterality: N/A;  3 hrs   POLYPECTOMY  02/20/2018   Procedure: POLYPECTOMY;  Surgeon: [name], MD;  Location: [facility];  Service: Endoscopy;;   REVERSE SHOULDER ARTHROPLASTY Right 09/06/2023   Procedure: REVERSE SHOULDER ARTHROPLASTY;  Surgeon: [name], MD;  Location: [facility] ORS;  Service: Orthopedics;  Laterality: Right;  interscalene  100   SHOULDER ARTHROSCOPY WITH SUBACROMIAL DECOMPRESSION AND OPEN ROTATOR C Left 09/28/2013   Procedure: LEFT SHOULDER ARTHROSCOPY WITH SUBACROMIAL DECOMPRESSION AND MINI OPEN ROTATOR CUFF REPAIR, OPEN DISTAL CLAVICLE RESECTION AND OPEN BICEP TENDODESIS ;  Surgeon: [name], MD;  Location: [facility] OR;  Service: Orthopedics;  Laterality: Left;   TRANSCAROTID ARTERY REVASCULARIZATION  Right 05/05/2021   Procedure: RIGHT TRANSCAROTID ARTERY REVASCULARIZATION;  Surgeon: [name], MD;  Location: [facility] OR;  Service: Vascular;  Laterality: Right;   UMBILICAL HERNIA REPAIR N/A 10/29/2017   Procedure: UMBILICAL HERNIA REPAIR;  Surgeon: [name], MD;  Location: [facility] OR;  Service: General;  Laterality: N/A;   Patient Active Problem List   Diagnosis Date Noted   Carotid stenosis, right 05/05/2021   Former smoker 03/24/2020   SOB (shortness of breath) 03/24/2020   Lumbar disc herniation 06/17/2019   Right lumbar radiculopathy 05/16/2019   Elevated PSA 05/16/2019   S/P angioplasty with stent 06/14/16 PCI & DES of high grade AV groove LCX and 1st OM  06/15/2016   CAD (coronary artery disease) 06/14/2016   Abnormal stress test    Carotid artery disease (HCC) 05/20/2013   Hypothyroidism 03/29/2012   Erectile dysfunction 07/02/2011   Anxiety 07/02/2011   Back pain 07/02/2011   Cervical disc disease 07/02/2011   Coronary  artery disease 04/29/2011   Squamous cell carcinoma of tongue (HCC) 04/29/2011   Hypertension 04/29/2011   Depression 04/29/2011   GE reflux 04/29/2011   Internal carotid artery stenosis 04/29/2011   Ptosis of eyelid, left 04/29/2011   Hyperlipidemia 04/29/2011   History of ischemic vertebrobasilar artery thalamic stroke 04/29/2011   Attention deficit disorder 04/29/2011   Degenerative joint disease of cervical spine 04/29/2011    PCP: Margaree Mackintosh, MD    REFERRING PROVIDER: [name], MD  REFERRING DIAG:  Z47.89 (ICD-10-CM) - Orthopedic aftercare  [phone] (ICD-10-CM) - S/p reverse total shoulder arthroplasty    THERAPY DIAG:  Acute pain of right shoulder  Stiffness of right shoulder, not elsewhere classified  Muscle weakness (generalized)  Rationale for Evaluation and Treatment: Rehabilitation  ONSET DATE: DOS R shoulder rTSA 09/06/2023  SUBJECTIVE:  SUBJECTIVE STATEMENT: Accompanied by friend today.  Continued pain when raising but improved ROM Hand dominance: Left  NEXT MD VISIT:  December 04, 2023  PERTINENT HISTORY: Anxiety, depression, history of arthritis/cervical disk disease, CAD, COPD, dyspnea on exertion, prostate cancer, squamous cell cancer of tongue, history of stroke, R RTC repair, R carotid stenosis, back pain.   PAIN:  Are you having pain? Yes: NPRS scale: 8/10 Pain location: R shoulder  Pain description: sharp, achey Aggravating factors: moving wrong, sleeping on it it. Relieving factors: Percocet, muscle relaxers  PRECAUTIONS: None  RED FLAGS: None   WEIGHT BEARING RESTRICTIONS: No  FALLS:  Has patient fallen in last 6 months?  Occasionally tripping over the dogs.   LIVING ENVIRONMENT: Lives with: lives with their spouse Lives in: House/apartment Stairs:  Yes: Internal: 14 steps; on left going up and External: 2 steps; on left going up Has following equipment at home: None  OCCUPATION: retired  PLOF: Independent and Leisure: woodworking  PATIENT GOALS: getting back to doing stuff and not being miserable.  Also has a couple of nodules in lungs, needs to be able to have arms up.   OBJECTIVE:   DIAGNOSTIC FINDINGS:  10/22/2023 Imaging: AP and scapular Y views of the right shoulder show a well-fixed and well-positioned right reverse TSA. No periprosthetic fractures or acromial fractures noted.    PATIENT SURVEYS:  Quick Dash 61.4%  COGNITION: Overall cognitive status: Within functional limits for tasks assessed     SENSATION: Reports pins/needles at night in R hand  POSTURE: Forward head, rounded shoulders.   UPPER EXTREMITY ROM:   Active ROM Right eval Left eval R 11/07/23 R 11/12/23  Shoulder flexion 60 135 100 120  Shoulder extension 35 45 35   Shoulder abduction 45 85 94 90  Shoulder adduction      Shoulder internal rotation 50 F- to ear 65 F- back of head  FIR- to glutes   Shoulder external rotation 10 F-to hip 70 F-small of back FER- to UT muscle 10  (Blank rows = not tested)  UPPER EXTREMITY MMT:  MMT Right eval Left eval  Shoulder flexion 2 5  Shoulder extension 2   Shoulder abduction 2 5  Shoulder internal rotation 2 5  Shoulder external rotation 2 5  Elbow flexion 4 5  Elbow extension 4p! 5  Wrist flexion    Wrist extension    Grip strength (lbs)    (Blank rows = not tested)  JOINT MOBILITY TESTING:  Decreased mobility all directions in [facility] joint  PALPATION:   Tenderness posterior R shoulder.   TODAY'S TREATMENT:                                                                                                                                         DATE: 11/14/23 Therapeutic Exercise: to improve strength and mobility.  Demo, verbal and tactile cues throughout for technique.  Supine R shoulder ER   with wand x 10 Supine chest press with wand x 12 Seated rows YTB x 10 Seated shoulder extension YTB x 10- cues for posture Seated AAROM prayer raise x 10  Seated abduction with wand x 10  Manual Therapy: to decrease muscle spasm and pain and improve mobility Mobs to [facility] joint all direction PROM using short lever arm to decrease discomfort  11/12/23 Therapeutic Exercise: to improve strength and mobility.  Demo, verbal and tactile cues throughout for technique. Pulleys x 2 min flexion - reports stabbing pain in R shoulder Table slides flexion - better tolerated Windshield wipers on table - not getting ER, IR only Scapular punches 2 x 10  AAROM shoulder flexion with hands clasped Supine shoulder ER with cane supine Seated ER at table - tolerated well Manual Therapy: to decrease muscle spasm and pain and improve mobility Mobs to [facility] joint all direction, traction and ER to [facility] joint using short lever arm to decrease discomfort, STM/TPR to pectoralis, deltoids.    11/07/23  Therapeutic Exercise: to improve strength and mobility.  Demo, verbal and tactile cues throughout for technique.  R shoulder PROM flex, abd, ER gentle stretching Supine shoulder flexion with wand x 5 Supine chest press with wand x 10 Supine R ER with wand x 10 Supine R shoulder abduction with wand x 10 Seated R shoulder AA flexion with cane x 10 Pulleys review  11/05/23 Therapeutic Exercise: to improve strength and mobility.  Demo, verbal and tactile cues throughout for technique. AAROM flexion with green pball 2 x 5 Scap retraction x 15 Supine shoulder flexion AA with dowel 2x5 PROM to R shoulder very gentle all directions, very limited ROM esp ER Seated shoulder swifer flexion x 5; scaption x 5 Seated ER AA with dowel x 5- increased pain after Manual Therapy: to decrease muscle spasm, pain and improve mobility.  STM to R anterior, lateral, posterior deltoids     PATIENT EDUCATION: Education details: HEP  update Person educated: Patient Education method: Explanation, Demonstration, Verbal cues, and Handouts Education comprehension: verbalized understanding and returned demonstration  HOME EXERCISE PROGRAM: Access Code: [email or web address] URL: [email or web address] Date: 11/12/2023 Prepared by: Harrie Foreman  Exercises - Seated Shoulder Flexion Towel Slide at Table Top  - 1 x daily - 7 x weekly - 1 sets - 10 reps - Seated Scapular Retraction  - 1 x daily - 7 x weekly - 1 sets - 5 reps - 5 sec hold - Isometric Shoulder Adduction  - 3 x daily - 7 x weekly - 1 sets - 5 reps - 5 sec hold - Isometric Shoulder External Rotation  - 3 x daily - 7 x weekly - 1 sets - 5 reps - 5 hold - Isometric Shoulder Internal Rotation  - 3 x daily - 7 x weekly - 1 sets - 5 reps - 5 hold - Isometric Shoulder Flexion  - 3 x daily - 7 x weekly - 1 sets - 5 reps - 5 hold - Seated Isometric Shoulder Extension  - 3 x daily - 7 x weekly - 1 sets - 5 reps - 5 hold - Seated Shoulder Flexion Extension AAROM with Dowel into Wall  - 3 x daily - 7 x weekly - 1 sets - 10 reps - Supine Shoulder Flexion Extension AAROM with Dowel  - 1 x daily - 7 x weekly - 2 sets - 10 reps - Supine Shoulder Scaption with Dowel  - 1 x daily - 7  x weekly - 2 sets - 10 reps - Supine Shoulder External Rotation with Dowel  - 1 x daily - 7 x weekly - 2 sets - 10 reps - Supine Single Arm Shoulder Protraction  - 1 x daily - 7 x weekly - 2 sets - 10 reps - Supine Shoulder Flexion AAROM with Hands Clasped  - 1 x daily - 7 x weekly - 2 sets - 10 reps - Seated Shoulder External Rotation PROM on Table  - 1 x daily - 7 x weekly - 1 sets - 5 reps - 15 sec hold  ASSESSMENT:  CLINICAL IMPRESSION: [name] continues to demonstrate improvements with shoulder flexion and abduction, but still extremely limited with ER. Initially had increased stabbing pain with eccentrics from shoulder flexion and abduction but post session mild pain with  eccentrics. Introduced postural strengthening today and he would continue to benefit from these type of interventions, as he needed cueing to set his scapula before movements. Lyn Henri would benefit from skilled therapy to improve Right shoulder ROM, decrease pain and improve functional mobility and ability to perform ADLs.   OBJECTIVE IMPAIRMENTS: decreased activity tolerance, decreased mobility, decreased ROM, decreased strength, hypomobility, increased fascial restrictions, increased muscle spasms, impaired flexibility, impaired sensation, impaired UE functional use, postural dysfunction, and pain.   ACTIVITY LIMITATIONS: carrying, lifting, bending, sleeping, bed mobility, bathing, dressing, reach over head, and hygiene/grooming  PARTICIPATION LIMITATIONS: meal prep, cleaning, laundry, driving, shopping, community activity, and yard work  PERSONAL FACTORS: Age, Past/current experiences, Time since onset of injury/illness/exacerbation, and 3+ comorbidities: Anxiety, depression, history of arthritis/cervical disk disease, CAD, COPD, dyspnea on exertion, prostate cancer, squamous cell cancer of tongue, history of stroke, R RTC repair, R carotid stenosis, back pain.   are also affecting patient's functional outcome.   REHAB POTENTIAL: Good  CLINICAL DECISION MAKING: Evolving/moderate complexity  EVALUATION COMPLEXITY: Moderate  GOALS: Goals reviewed with patient? Yes  SHORT TERM GOALS: Target date: 11/26/2023   Patient will be independent with initial HEP.  Baseline: given Goal status: MET 11/12/23 met  2.  Patient will reports pain < 3/10 with ROM Baseline: 7-8/10 Goal status: IN PROGRESS   3.  Patient will demonstrate ROM to 130 deg forward elevation and 30 deg ER  Baseline: 60 deg ff, 10 deg ER Goal status: IN PROGRESS 11/12/23 120 flexion, 10 deg ER   LONG TERM GOALS: Target date: 12/24/2023   Patient will be independent with advanced/ongoing HEP to improve outcomes and  carryover.  Baseline:  Goal status: IN PROGRESS  2.  Patient will report 75% improvement in Right shoulder pain to improve QOL.  Baseline:  Goal status: IN PROGRESS  3.   Patient to improve Right shoulder AROM to [facility] without pain provocation to allow for increased ease of ADLs.  Baseline: see objective Goal status: IN PROGRESS  4.  Patient will demonstrate improved functional UE strength as demonstrated by 4+/5 R shoulder strength. Baseline: see objective Goal status: IN PROGRESS  5.  Patient will report 10 points improvement on QuickDash to demonstrate improved functional ability.  Baseline: 61.4% impairment Goal status: IN PROGRESS  6.  Patient will be able to be able to lay with arms overhead comfortably to undergo radiation treatment.    Baseline: unable Goal status: IN PROGRESS   PLAN:  PT FREQUENCY: 2x/week  PT DURATION: 8 weeks  PLANNED INTERVENTIONS: 97110-Therapeutic exercises, 97530- Therapeutic activity, [id number]- Neuromuscular re-education, 97535- Self Care, [id number]- Manual therapy, [id number]- Electrical stimulation (unattended), [id number]- Electrical  stimulation (manual), 96295- Vasopneumatic device, [id number]- Ultrasound, [id number]- Ionotophoresis 4mg /ml Dexamethasone, Patient/Family education, Taping, Dry Needling, Joint mobilization, Joint manipulation, Spinal manipulation, Spinal mobilization, Cryotherapy, and Moist heat  PLAN FOR NEXT SESSION: gentle mobilization for ROM, progress per protocol and tolerance, DOS 09/06/2023, 10 weeks post op as of 11/15/2023   Darleene Cleaver, PTA 11/14/2023, 11:45 AM

## 2023-11-19 ENCOUNTER — Ambulatory Visit: Payer: Medicare Other

## 2023-11-19 DIAGNOSIS — M25511 Pain in right shoulder: Secondary | ICD-10-CM | POA: Diagnosis not present

## 2023-11-19 DIAGNOSIS — M25611 Stiffness of right shoulder, not elsewhere classified: Secondary | ICD-10-CM

## 2023-11-19 DIAGNOSIS — M6281 Muscle weakness (generalized): Secondary | ICD-10-CM

## 2023-11-19 NOTE — Therapy (Signed)
OUTPATIENT PHYSICAL THERAPY SHOULDER TREATMENT   Patient Name: [name] MRN: [id number] DOB:1951-05-15, 73 y.o., male Today's Date: 11/19/2023  END OF SESSION:  PT End of Session - 11/19/23 1059     Visit Number 6    Date for PT Re-Evaluation 12/24/23    Authorization Type Medicare + [employer] Supplement    Progress Note Due on Visit 10    PT Start Time 1011    PT Stop Time 1055    PT Time Calculation (min) 44 min    Activity Tolerance Patient tolerated treatment well;Patient limited by pain    Behavior During Therapy WFL for tasks assessed/performed                 Past Medical History:  Diagnosis Date   ADHD (attention deficit hyperactivity disorder)    Anxiety    Arthritis    "NECK, HANDS, KNEES" (06/14/2016)   Atypical mole 08/07/2021   Mid Back (Atypical Proliferation)   CAD (coronary artery disease)    Carotid artery stenosis    Complication of anesthesia    Reports that he had trouble swallowing post anesthesia- CABG   COPD (chronic obstructive pulmonary disease) (HCC) 12/11/2011   r/s mv - EF 72%; exercise capacity 13 METS; no exercised induced ischemic EKG changes   Depression, major    Dyspnea    with exertion   GERD (gastroesophageal reflux disease)    HTN (hypertension), benign 04/16/2008   echo - EF >55%; no regional wall or valvular abnormalities   Hyperlipidemia    statin intolerant   Hypothyroidism    Migraine    "crippling headaches as a kid; silent migraines now, no pain, couple times/week" (06/14/2016)   Peripheral vascular disease (HCC)    Prostate cancer (HCC) 04/2019   recent dx - Dr [name]   S/P angioplasty with stent 06/14/16 PCI & DES of high grade AV groove LCX and 1st OM  06/15/2016   Squamous cell cancer of tongue (HCC) ~ 2002   "35 radiation treatments at [facility]"   Stroke [facility])    hx Thalamic Ischemic CVA   Subclavian steal syndrome 06/17/2012   carotid doppler - R systolic brachial pressure , L ; R  subclavian artery - proxmial obsstruction w/ abnormal monophasic waveforms, R ECA known occlusive disease; L ECA narrowing w/ 70-99% diameter reductiona   TIA (transient ischemic attack) 2005   Viral hepatitis 1970s   "non specific"   Past Surgical History:  Procedure Laterality Date   BACK SURGERY     BIOPSY  02/20/2018   Procedure: BIOPSY;  Surgeon: [name], MD;  Location: [facility];  Service: Endoscopy;;   BIOPSY TONGUE  ~ 2002   "base of tongue and uvula"   CARDIAC CATHETERIZATION  04/26/2008   L circumflex 99% stenosed midportion straddling a marginal branch; RCA total priximally w/ grade 2 L to R collaterals; renal arteries widely patent; LAD 30% segmental proximal stenosis   CARDIAC CATHETERIZATION N/A 05/14/2016   Procedure: Left Heart Cath and Cors/Grafts Angiography;  Surgeon: [name], MD;  Location: [facility];  Service: Cardiovascular;  Laterality: N/A;   CARDIAC CATHETERIZATION N/A 06/14/2016   Procedure: Coronary Stent Intervention;  Surgeon: [name], MD;  Location: [facility] CV [facility];  Service: Cardiovascular;  Laterality: N/A;   CERVICAL DISC SURGERY  2000   Kritzer    Posterior approach   COLONOSCOPY WITH PROPOFOL N/A 02/20/2018   Procedure: COLONOSCOPY WITH PROPOFOL;  Surgeon: [name],  MD;  Location: [facility];  Service: Endoscopy;  Laterality: N/A;   CORONARY ANGIOPLASTY WITH STENT PLACEMENT     CORONARY ARTERY BYPASS GRAFT  2009   x5; LIMA to LAd, sequential vein to first diagonal branch, first and second obtuse marginal branches; vein to PDA   ESOPHAGOGASTRODUODENOSCOPY (EGD) WITH PROPOFOL N/A 02/20/2018   Procedure: ESOPHAGOGASTRODUODENOSCOPY (EGD) WITH PROPOFOL;  Surgeon: [name], MD;  Location: [facility];  Service: Endoscopy;  Laterality: N/A;   INSERTION OF MESH N/A 10/29/2017   Procedure: INSERTION OF MESH;  Surgeon: [name], MD;  Location[facility] OR;  Service: General;  Laterality: N/A;   LUMBAR DISC SURGERY   06/17/2019   LUMBAR 2 & 3    LUMBAR LAMINECTOMY/DECOMPRESSION MICRODISCECTOMY N/A 06/17/2019   Procedure: Lumbar two -Lumbar three decompression and disectomy;  Surgeon: [name], MD;  Location: [facility] OR;  Service: Orthopedics;  Laterality: N/A;  3 hrs   POLYPECTOMY  02/20/2018   Procedure: POLYPECTOMY;  Surgeon: [name], MD;  Location: [facility];  Service: Endoscopy;;   REVERSE SHOULDER ARTHROPLASTY Right 09/06/2023   Procedure: REVERSE SHOULDER ARTHROPLASTY;  Surgeon: [name], MD;  Location: [facility] ORS;  Service: Orthopedics;  Laterality: Right;  interscalene  100   SHOULDER ARTHROSCOPY WITH SUBACROMIAL DECOMPRESSION AND OPEN ROTATOR C Left 09/28/2013   Procedure: LEFT SHOULDER ARTHROSCOPY WITH SUBACROMIAL DECOMPRESSION AND MINI OPEN ROTATOR CUFF REPAIR, OPEN DISTAL CLAVICLE RESECTION AND OPEN BICEP TENDODESIS ;  Surgeon: [name], MD;  Location: [facility] OR;  Service: Orthopedics;  Laterality: Left;   TRANSCAROTID ARTERY REVASCULARIZATION  Right 05/05/2021   Procedure: RIGHT TRANSCAROTID ARTERY REVASCULARIZATION;  Surgeon: [name], MD;  Location: [facility] OR;  Service: Vascular;  Laterality: Right;   UMBILICAL HERNIA REPAIR N/A 10/29/2017   Procedure: UMBILICAL HERNIA REPAIR;  Surgeon: [name], MD;  Location: [facility] OR;  Service: General;  Laterality: N/A;   Patient Active Problem List   Diagnosis Date Noted   Carotid stenosis, right 05/05/2021   Former smoker 03/24/2020   SOB (shortness of breath) 03/24/2020   Lumbar disc herniation 06/17/2019   Right lumbar radiculopathy 05/16/2019   Elevated PSA 05/16/2019   S/P angioplasty with stent 06/14/16 PCI & DES of high grade AV groove LCX and 1st OM  06/15/2016   CAD (coronary artery disease) 06/14/2016   Abnormal stress test    Carotid artery disease (HCC) 05/20/2013   Hypothyroidism 03/29/2012   Erectile dysfunction 07/02/2011   Anxiety 07/02/2011   Back pain 07/02/2011   Cervical disc disease 07/02/2011   Coronary  artery disease 04/29/2011   Squamous cell carcinoma of tongue (HCC) 04/29/2011   Hypertension 04/29/2011   Depression 04/29/2011   GE reflux 04/29/2011   Internal carotid artery stenosis 04/29/2011   Ptosis of eyelid, left 04/29/2011   Hyperlipidemia 04/29/2011   History of ischemic vertebrobasilar artery thalamic stroke 04/29/2011   Attention deficit disorder 04/29/2011   Degenerative joint disease of cervical spine 04/29/2011    PCP: Margaree Mackintosh, MD    REFERRING PROVIDER: [name], MD  REFERRING DIAG:  Z47.89 (ICD-10-CM) - Orthopedic aftercare  [phone] (ICD-10-CM) - S/p reverse total shoulder arthroplasty    THERAPY DIAG:  Acute pain of right shoulder  Stiffness of right shoulder, not elsewhere classified  Muscle weakness (generalized)  Rationale for Evaluation and Treatment: Rehabilitation  ONSET DATE: DOS R shoulder rTSA 09/06/2023  SUBJECTIVE:  SUBJECTIVE STATEMENT: Accompanied by friend today.  Had PET scan yesterday, so did not exercise. Pt reports that hi [place] is better when lifting his shoulder. Hand dominance: Left  NEXT MD VISIT:  December 04, 2023  PERTINENT HISTORY: Anxiety, depression, history of arthritis/cervical disk disease, CAD, COPD, dyspnea on exertion, prostate cancer, squamous cell cancer of tongue, history of stroke, R RTC repair, R carotid stenosis, back pain.   PAIN:  Are you having pain? Yes: NPRS scale: 4-5/10 Pain location: R anterior shoulder  Pain description: sharp, achey Aggravating factors: moving wrong, sleeping on it it. Relieving factors: Percocet, muscle relaxers  PRECAUTIONS: None  RED FLAGS: None   WEIGHT BEARING RESTRICTIONS: No  FALLS:  Has patient fallen in last 6 months?  Occasionally tripping over the dogs.   LIVING  ENVIRONMENT: Lives with: lives with their spouse Lives in: House/apartment Stairs: Yes: Internal: 14 steps; on left going up and External: 2 steps; on left going up Has following equipment at home: None  OCCUPATION: retired  PLOF: Independent and Leisure: woodworking  PATIENT GOALS: getting back to doing stuff and not being miserable.  Also has a couple of nodules in lungs, needs to be able to have arms up.   OBJECTIVE:   DIAGNOSTIC FINDINGS:  10/22/2023 Imaging: AP and scapular Y views of the right shoulder show a well-fixed and well-positioned right reverse TSA. No periprosthetic fractures or acromial fractures noted.    PATIENT SURVEYS:  Quick Dash 61.4%  COGNITION: Overall cognitive status: Within functional limits for tasks assessed     SENSATION: Reports pins/needles at night in R hand  POSTURE: Forward head, rounded shoulders.   UPPER EXTREMITY ROM:   Active ROM Right eval Left eval R 11/07/23 R 11/12/23  Shoulder flexion 60 135 100 120  Shoulder extension 35 45 35   Shoulder abduction 45 85 94 90  Shoulder adduction      Shoulder internal rotation 50 F- to ear 65 F- back of head  FIR- to glutes   Shoulder external rotation 10 F-to hip 70 F-small of back FER- to UT muscle 10  (Blank rows = not tested)  UPPER EXTREMITY MMT:  MMT Right eval Left eval  Shoulder flexion 2 5  Shoulder extension 2   Shoulder abduction 2 5  Shoulder internal rotation 2 5  Shoulder external rotation 2 5  Elbow flexion 4 5  Elbow extension 4p! 5  Wrist flexion    Wrist extension    Grip strength (lbs)    (Blank rows = not tested)  JOINT MOBILITY TESTING:  Decreased mobility all directions in [facility] joint  PALPATION:   Tenderness posterior R shoulder.   TODAY'S TREATMENT:                                                                                                                                         DATE: 11/19/23 Therapeutic Activity: to  improve functional  performance. Shoulder AAROM wall slide into flexion x 10 Standing rows YTB x 10 Standing shoulder ext YTB x 10 Standing shoulder AAROM flexion with wand x 10 Shoulder abduction AAROM with wand x 10 Standing ER with wand x 10 AAROM Manual Therapy: to decrease muscle spasm and pain and improve mobility STM to R UT, middle and pot deltoids Passive stretching for ER flexion and abd 11/14/23 Therapeutic Exercise: to improve strength and mobility.  Demo, verbal and tactile cues throughout for technique. Supine R shoulder ER  with wand x 10 Supine chest press with wand x 12 Seated rows YTB x 10 Seated shoulder extension YTB x 10- cues for posture Seated AAROM prayer raise x 10  Seated abduction with wand x 10  Manual Therapy: to decrease muscle spasm and pain and improve mobility Mobs to [facility] joint all direction PROM using short lever arm to decrease discomfort  11/12/23 Therapeutic Exercise: to improve strength and mobility.  Demo, verbal and tactile cues throughout for technique. Pulleys x 2 min flexion - reports stabbing pain in R shoulder Table slides flexion - better tolerated Windshield wipers on table - not getting ER, IR only Scapular punches 2 x 10  AAROM shoulder flexion with hands clasped Supine shoulder ER with cane supine Seated ER at table - tolerated well Manual Therapy: to decrease muscle spasm and pain and improve mobility Mobs to [facility] joint all direction, traction and ER to [facility] joint using short lever arm to decrease discomfort, STM/TPR to pectoralis, deltoids.    11/07/23  Therapeutic Exercise: to improve strength and mobility.  Demo, verbal and tactile cues throughout for technique.  R shoulder PROM flex, abd, ER gentle stretching Supine shoulder flexion with wand x 5 Supine chest press with wand x 10 Supine R ER with wand x 10 Supine R shoulder abduction with wand x 10 Seated R shoulder AA flexion with cane x 10 Pulleys review  11/05/23 Therapeutic Exercise: to  improve strength and mobility.  Demo, verbal and tactile cues throughout for technique. AAROM flexion with green pball 2 x 5 Scap retraction x 15 Supine shoulder flexion AA with dowel 2x5 PROM to R shoulder very gentle all directions, very limited ROM esp ER Seated shoulder swifer flexion x 5; scaption x 5 Seated ER AA with dowel x 5- increased pain after Manual Therapy: to decrease muscle spasm, pain and improve mobility.  STM to R anterior, lateral, posterior deltoids     PATIENT EDUCATION: Education details: HEP update Person educated: Patient Education method: Explanation, Demonstration, Verbal cues, and Handouts Education comprehension: verbalized understanding and returned demonstration  HOME EXERCISE PROGRAM: Access Code: [email or web address] URL: [email or web address] Date: 11/19/2023 Prepared by: Verta Ellen  Exercises - Seated Shoulder Flexion Towel Slide at Table Top  - 1 x daily - 7 x weekly - 1 sets - 10 reps - Seated Scapular Retraction  - 1 x daily - 7 x weekly - 1 sets - 5 reps - 5 sec hold - Seated Shoulder Flexion Extension AAROM with Dowel into Wall  - 3 x daily - 7 x weekly - 1 sets - 10 reps - Supine Shoulder Flexion Extension AAROM with Dowel  - 1 x daily - 7 x weekly - 2 sets - 10 reps - Supine Shoulder Scaption with Dowel  - 1 x daily - 7 x weekly - 2 sets - 10 reps - Supine Shoulder External Rotation with Dowel  - 1 x daily - 7 x weekly - 2 sets -  10 reps - Supine Single Arm Shoulder Protraction  - 1 x daily - 7 x weekly - 2 sets - 10 reps - Supine Shoulder Flexion AAROM with Hands Clasped  - 1 x daily - 7 x weekly - 2 sets - 10 reps - Seated Shoulder External Rotation PROM on Table  - 1 x daily - 7 x weekly - 1 sets - 5 reps - 15 sec hold - Standing Shoulder Row with Anchored Resistance  - 1 x daily - 7 x weekly - 2 sets - 10 reps - Shoulder extension with resistance - Neutral  - 1 x daily - 7 x weekly - 2 sets - 10 reps  ASSESSMENT:  CLINICAL  IMPRESSION: [name] continues to demonstrate improvements with shoulder flexion and abduction, but still extremely limited with ER. Able to add AAROM in standing with good ROM and less pain, working on more functional reaching. Pt gets quite a bit of UT activation with shoulder flexion and abduction, provided cues to try to promote proper shoulder mechanics. ER still pretty limited. [name] would benefit from skilled therapy to improve Right shoulder ROM, decrease pain and improve functional mobility and ability to perform ADLs.   OBJECTIVE IMPAIRMENTS: decreased activity tolerance, decreased mobility, decreased ROM, decreased strength, hypomobility, increased fascial restrictions, increased muscle spasms, impaired flexibility, impaired sensation, impaired UE functional use, postural dysfunction, and pain.   ACTIVITY LIMITATIONS: carrying, lifting, bending, sleeping, bed mobility, bathing, dressing, reach over head, and hygiene/grooming  PARTICIPATION LIMITATIONS: meal prep, cleaning, laundry, driving, shopping, community activity, and yard work  PERSONAL FACTORS: Age, Past/current experiences, Time since onset of injury/illness/exacerbation, and 3+ comorbidities: Anxiety, depression, history of arthritis/cervical disk disease, CAD, COPD, dyspnea on exertion, prostate cancer, squamous cell cancer of tongue, history of stroke, R RTC repair, R carotid stenosis, back pain.   are also affecting patient's functional outcome.   REHAB POTENTIAL: Good  CLINICAL DECISION MAKING: Evolving/moderate complexity  EVALUATION COMPLEXITY: Moderate  GOALS: Goals reviewed with patient? Yes  SHORT TERM GOALS: Target date: 11/26/2023   Patient will be independent with initial HEP.  Baseline: given Goal status: MET 11/12/23 met  2.  Patient will reports pain < 3/10 with ROM Baseline: 7-8/10 Goal status: IN PROGRESS   3.  Patient will demonstrate ROM to 130 deg forward elevation and 30 deg ER   Baseline: 60 deg ff, 10 deg ER Goal status: IN PROGRESS 11/12/23 120 flexion, 10 deg ER   LONG TERM GOALS: Target date: 12/24/2023   Patient will be independent with advanced/ongoing HEP to improve outcomes and carryover.  Baseline:  Goal status: IN PROGRESS  2.  Patient will report 75% improvement in Right shoulder pain to improve QOL.  Baseline:  Goal status: IN PROGRESS  3.   Patient to improve Right shoulder AROM to [facility] without pain provocation to allow for increased ease of ADLs.  Baseline: see objective Goal status: IN PROGRESS  4.  Patient will demonstrate improved functional UE strength as demonstrated by 4+/5 R shoulder strength. Baseline: see objective Goal status: IN PROGRESS  5.  Patient will report 10 points improvement on QuickDash to demonstrate improved functional ability.  Baseline: 61.4% impairment Goal status: IN PROGRESS  6.  Patient will be able to be able to lay with arms overhead comfortably to undergo radiation treatment.    Baseline: unable Goal status: IN PROGRESS   PLAN:  PT FREQUENCY: 2x/week  PT DURATION: 8 weeks  PLANNED INTERVENTIONS: 97110-Therapeutic exercises, 97530- Therapeutic activity,  78295- Neuromuscular re-education, [phone]- Self Care, 86578- Manual therapy, [id number]- Electrical stimulation (unattended), [id number]- Electrical stimulation (manual), [id number]- Vasopneumatic device, [id number]- Ultrasound, [id number]- Ionotophoresis 4mg /ml Dexamethasone, Patient/Family education, Taping, Dry Needling, Joint mobilization, Joint manipulation, Spinal manipulation, Spinal mobilization, Cryotherapy, and Moist heat  PLAN FOR NEXT SESSION: gentle mobilization for ROM, progress per protocol and tolerance, DOS 09/06/2023, 10 weeks post op as of 11/15/2023   Darleene Cleaver, PTA 11/19/2023, 10:59 AM

## 2023-11-21 ENCOUNTER — Ambulatory Visit: Payer: Medicare Other

## 2023-11-21 DIAGNOSIS — M25611 Stiffness of right shoulder, not elsewhere classified: Secondary | ICD-10-CM

## 2023-11-21 DIAGNOSIS — M25511 Pain in right shoulder: Secondary | ICD-10-CM | POA: Diagnosis not present

## 2023-11-21 DIAGNOSIS — M6281 Muscle weakness (generalized): Secondary | ICD-10-CM

## 2023-11-21 NOTE — Therapy (Signed)
OUTPATIENT PHYSICAL THERAPY SHOULDER TREATMENT   Patient Name: [name] MRN: [id number] DOB:September 25, 1951, 73 y.o., male Today's Date: 11/21/2023  END OF SESSION:  PT End of Session - 11/21/23 1113     Visit Number 7    Date for PT Re-Evaluation 12/24/23    Authorization Type Medicare + [employer] Supplement    Progress Note Due on Visit 10    PT Start Time 1101    PT Stop Time 1144    PT Time Calculation (min) 43 min    Activity Tolerance Patient tolerated treatment well    Behavior During Therapy WFL for tasks assessed/performed                  Past Medical History:  Diagnosis Date   ADHD (attention deficit hyperactivity disorder)    Anxiety    Arthritis    "NECK, HANDS, KNEES" (06/14/2016)   Atypical mole 08/07/2021   Mid Back (Atypical Proliferation)   CAD (coronary artery disease)    Carotid artery stenosis    Complication of anesthesia    Reports that he had trouble swallowing post anesthesia- CABG   COPD (chronic obstructive pulmonary disease) (HCC) 12/11/2011   r/s mv - EF 72%; exercise capacity 13 METS; no exercised induced ischemic EKG changes   Depression, major    Dyspnea    with exertion   GERD (gastroesophageal reflux disease)    HTN (hypertension), benign 04/16/2008   echo - EF >55%; no regional wall or valvular abnormalities   Hyperlipidemia    statin intolerant   Hypothyroidism    Migraine    "crippling headaches as a kid; silent migraines now, no pain, couple times/week" (06/14/2016)   Peripheral vascular disease (HCC)    Prostate cancer (HCC) 04/2019   recent dx - Dr [name]   S/P angioplasty with stent 06/14/16 PCI & DES of high grade AV groove LCX and 1st OM  06/15/2016   Squamous cell cancer of tongue (HCC) ~ 2002   "35 radiation treatments at [facility]"   Stroke [facility])    hx Thalamic Ischemic CVA   Subclavian steal syndrome 06/17/2012   carotid doppler - R systolic brachial pressure , L ; R subclavian artery -  proxmial obsstruction w/ abnormal monophasic waveforms, R ECA known occlusive disease; L ECA narrowing w/ 70-99% diameter reductiona   TIA (transient ischemic attack) 2005   Viral hepatitis 1970s   "non specific"   Past Surgical History:  Procedure Laterality Date   BACK SURGERY     BIOPSY  02/20/2018   Procedure: BIOPSY;  Surgeon: [name], MD;  Location: [facility];  Service: Endoscopy;;   BIOPSY TONGUE  ~ 2002   "base of tongue and uvula"   CARDIAC CATHETERIZATION  04/26/2008   L circumflex 99% stenosed midportion straddling a marginal branch; RCA total priximally w/ grade 2 L to R collaterals; renal arteries widely patent; LAD 30% segmental proximal stenosis   CARDIAC CATHETERIZATION N/A 05/14/2016   Procedure: Left Heart Cath and Cors/Grafts Angiography;  Surgeon: [name], MD;  Location: [facility];  Service: Cardiovascular;  Laterality: N/A;   CARDIAC CATHETERIZATION N/A 06/14/2016   Procedure: Coronary Stent Intervention;  Surgeon: [name], MD;  Location: [facility];  Service: Cardiovascular;  Laterality: N/A;   CERVICAL DISC SURGERY  2000   Kritzer    Posterior approach   COLONOSCOPY WITH PROPOFOL N/A 02/20/2018   Procedure: COLONOSCOPY WITH PROPOFOL;  Surgeon: [name], MD;  Location: [facility];  Service: Endoscopy;  Laterality: N/A;   CORONARY ANGIOPLASTY WITH STENT PLACEMENT     CORONARY ARTERY BYPASS GRAFT  2009   x5; LIMA to LAd, sequential vein to first diagonal branch, first and second obtuse marginal branches; vein to PDA   ESOPHAGOGASTRODUODENOSCOPY (EGD) WITH PROPOFOL N/A 02/20/2018   Procedure: ESOPHAGOGASTRODUODENOSCOPY (EGD) WITH PROPOFOL;  Surgeon: [name], MD;  Location: [facility];  Service: Endoscopy;  Laterality: N/A;   INSERTION OF MESH N/A 10/29/2017   Procedure: INSERTION OF MESH;  Surgeon: [name], MD;  Location: [facility] OR;  Service: General;  Laterality: N/A;   LUMBAR DISC SURGERY  06/17/2019    LUMBAR 2 & 3    LUMBAR LAMINECTOMY/DECOMPRESSION MICRODISCECTOMY N/A 06/17/2019   Procedure: Lumbar two -Lumbar three decompression and disectomy;  Surgeon: [name], MD;  Location: [facility] OR;  Service: Orthopedics;  Laterality: N/A;  3 hrs   POLYPECTOMY  02/20/2018   Procedure: POLYPECTOMY;  Surgeon: [name], MD;  Location: [facility];  Service: Endoscopy;;   REVERSE SHOULDER ARTHROPLASTY Right 09/06/2023   Procedure: REVERSE SHOULDER ARTHROPLASTY;  Surgeon: [name], MD;  Location: [facility];  Service: Orthopedics;  Laterality: Right;  interscalene  100   SHOULDER ARTHROSCOPY WITH SUBACROMIAL DECOMPRESSION AND OPEN ROTATOR C Left 09/28/2013   Procedure: LEFT SHOULDER ARTHROSCOPY WITH SUBACROMIAL DECOMPRESSION AND MINI OPEN ROTATOR CUFF REPAIR, OPEN DISTAL CLAVICLE RESECTION AND OPEN BICEP TENDODESIS ;  Surgeon: [name], MD;  Location[facility];  Service: Orthopedics;  Laterality: Left;   TRANSCAROTID ARTERY REVASCULARIZATION  Right 05/05/2021   Procedure: RIGHT TRANSCAROTID ARTERY REVASCULARIZATION;  Surgeon: [name], MD;  Location: [facility] OR;  Service: Vascular;  Laterality: Right;   UMBILICAL HERNIA REPAIR N/A 10/29/2017   Procedure: UMBILICAL HERNIA REPAIR;  Surgeon: [name], MD;  Location: [facility] OR;  Service: General;  Laterality: N/A;   Patient Active Problem List   Diagnosis Date Noted   Carotid stenosis, right 05/05/2021   Former smoker 03/24/2020   SOB (shortness of breath) 03/24/2020   Lumbar disc herniation 06/17/2019   Right lumbar radiculopathy 05/16/2019   Elevated PSA 05/16/2019   S/P angioplasty with stent 06/14/16 PCI & DES of high grade AV groove LCX and 1st OM  06/15/2016   CAD (coronary artery disease) 06/14/2016   Abnormal stress test    Carotid artery disease (HCC) 05/20/2013   Hypothyroidism 03/29/2012   Erectile dysfunction 07/02/2011   Anxiety 07/02/2011   Back pain 07/02/2011   Cervical disc disease 07/02/2011   Coronary artery  disease 04/29/2011   Squamous cell carcinoma of tongue (HCC) 04/29/2011   Hypertension 04/29/2011   Depression 04/29/2011   GE reflux 04/29/2011   Internal carotid artery stenosis 04/29/2011   Ptosis of eyelid, left 04/29/2011   Hyperlipidemia 04/29/2011   History of ischemic vertebrobasilar artery thalamic stroke 04/29/2011   Attention deficit disorder 04/29/2011   Degenerative joint disease of cervical spine 04/29/2011    PCP: Margaree Mackintosh, MD    REFERRING PROVIDER: [name], MD  REFERRING DIAG:  Z47.89 (ICD-10-CM) - Orthopedic aftercare  [phone] (ICD-10-CM) - S/p reverse total shoulder arthroplasty    THERAPY DIAG:  Acute pain of right shoulder  Stiffness of right shoulder, not elsewhere classified  Muscle weakness (generalized)  Rationale for Evaluation and Treatment: Rehabilitation  ONSET DATE: DOS R shoulder rTSA 09/06/2023  SUBJECTIVE:  SUBJECTIVE STATEMENT: Accompanied by wife today. Pt reports less pain today when rasing his arm. Hand dominance: Left  NEXT MD VISIT:  December 04, 2023  PERTINENT HISTORY: Anxiety, depression, history of arthritis/cervical disk disease, CAD, COPD, dyspnea on exertion, prostate cancer, squamous cell cancer of tongue, history of stroke, R RTC repair, R carotid stenosis, back pain.   PAIN:  Are you having pain? Yes: NPRS scale: 3-4/10 Pain location: R anterior shoulder  Pain description: sharp, achey Aggravating factors: moving wrong, sleeping on it it. Relieving factors: Percocet, muscle relaxers  PRECAUTIONS: None  RED FLAGS: None   WEIGHT BEARING RESTRICTIONS: No  FALLS:  Has patient fallen in last 6 months?  Occasionally tripping over the dogs.   LIVING ENVIRONMENT: Lives with: lives with their spouse Lives in:  House/apartment Stairs: Yes: Internal: 14 steps; on left going up and External: 2 steps; on left going up Has following equipment at home: None  OCCUPATION: retired  PLOF: Independent and Leisure: woodworking  PATIENT GOALS: getting back to doing stuff and not being miserable.  Also has a couple of nodules in lungs, needs to be able to have arms up.   OBJECTIVE:   DIAGNOSTIC FINDINGS:  10/22/2023 Imaging: AP and scapular Y views of the right shoulder show a well-fixed and well-positioned right reverse TSA. No periprosthetic fractures or acromial fractures noted.    PATIENT SURVEYS:  Quick Dash 61.4%  COGNITION: Overall cognitive status: Within functional limits for tasks assessed     SENSATION: Reports pins/needles at night in R hand  POSTURE: Forward head, rounded shoulders.   UPPER EXTREMITY ROM:   Active ROM Right eval Left eval R 11/07/23 R 11/12/23 R 11/21/23  Shoulder flexion 60 135 100 120 108  Shoulder extension 35 45 35    Shoulder abduction 45 85 94 90 100  Shoulder adduction       Shoulder internal rotation 50 F- to ear 65 F- back of head  FIR- to glutes    Shoulder external rotation 10 F-to hip 70 F-small of back FER- to UT muscle 10 21  (Blank rows = not tested)  UPPER EXTREMITY MMT:  MMT Right eval Left eval  Shoulder flexion 2 5  Shoulder extension 2   Shoulder abduction 2 5  Shoulder internal rotation 2 5  Shoulder external rotation 2 5  Elbow flexion 4 5  Elbow extension 4p! 5  Wrist flexion    Wrist extension    Grip strength (lbs)    (Blank rows = not tested)  JOINT MOBILITY TESTING:  Decreased mobility all directions in [facility] joint  PALPATION:   Tenderness posterior R shoulder.   TODAY'S TREATMENT:                                                                                                                                         DATE: 11/21/23 Shoulder AAROM wall slide into flexion  x 10 Finger ladder x 5 flexion Standing  rows YTB 2x10 Standing shoulder ext YTB 2x10 Up/down and side to side stabilization with orange pball x 10 PROM stretching for R shoulder all directions; PNF contract/relax for ER Measured shoulder ROM 11/19/23 Therapeutic Activity: to improve functional performance. Shoulder AAROM wall slide into flexion x 10 Standing rows YTB x 10 Standing shoulder ext YTB x 10 Standing shoulder AAROM flexion with wand x 10 Shoulder abduction AAROM with wand x 10 Standing ER with wand x 10 AAROM Manual Therapy: to decrease muscle spasm and pain and improve mobility STM to R UT, middle and pot deltoids Passive stretching for ER flexion and abd 11/14/23 Therapeutic Exercise: to improve strength and mobility.  Demo, verbal and tactile cues throughout for technique. Supine R shoulder ER  with wand x 10 Supine chest press with wand x 12 Seated rows YTB x 10 Seated shoulder extension YTB x 10- cues for posture Seated AAROM prayer raise x 10  Seated abduction with wand x 10  Manual Therapy: to decrease muscle spasm and pain and improve mobility Mobs to [facility] joint all direction PROM using short lever arm to decrease discomfort  11/12/23 Therapeutic Exercise: to improve strength and mobility.  Demo, verbal and tactile cues throughout for technique. Pulleys x 2 min flexion - reports stabbing pain in R shoulder Table slides flexion - better tolerated Windshield wipers on table - not getting ER, IR only Scapular punches 2 x 10  AAROM shoulder flexion with hands clasped Supine shoulder ER with cane supine Seated ER at table - tolerated well Manual Therapy: to decrease muscle spasm and pain and improve mobility Mobs to [facility] joint all direction, traction and ER to [facility] joint using short lever arm to decrease discomfort, STM/TPR to pectoralis, deltoids.    11/07/23  Therapeutic Exercise: to improve strength and mobility.  Demo, verbal and tactile cues throughout for technique.  R shoulder PROM flex, abd, ER gentle  stretching Supine shoulder flexion with wand x 5 Supine chest press with wand x 10 Supine R ER with wand x 10 Supine R shoulder abduction with wand x 10 Seated R shoulder AA flexion with cane x 10 Pulleys review  11/05/23 Therapeutic Exercise: to improve strength and mobility.  Demo, verbal and tactile cues throughout for technique. AAROM flexion with green pball 2 x 5 Scap retraction x 15 Supine shoulder flexion AA with dowel 2x5 PROM to R shoulder very gentle all directions, very limited ROM esp ER Seated shoulder swifer flexion x 5; scaption x 5 Seated ER AA with dowel x 5- increased pain after Manual Therapy: to decrease muscle spasm, pain and improve mobility.  STM to R anterior, lateral, posterior deltoids     PATIENT EDUCATION: Education details: HEP update Person educated: Patient Education method: Explanation, Demonstration, Verbal cues, and Handouts Education comprehension: verbalized understanding and returned demonstration  HOME EXERCISE PROGRAM: Access Code: [email or web address] URL: [email or web address] Date: 11/19/2023 Prepared by: Verta Ellen  Exercises - Seated Shoulder Flexion Towel Slide at Table Top  - 1 x daily - 7 x weekly - 1 sets - 10 reps - Seated Scapular Retraction  - 1 x daily - 7 x weekly - 1 sets - 5 reps - 5 sec hold - Seated Shoulder Flexion Extension AAROM with Dowel into Wall  - 3 x daily - 7 x weekly - 1 sets - 10 reps - Supine Shoulder Flexion Extension AAROM with Dowel  - 1 x daily - 7 x weekly -  2 sets - 10 reps - Supine Shoulder Scaption with Dowel  - 1 x daily - 7 x weekly - 2 sets - 10 reps - Supine Shoulder External Rotation with Dowel  - 1 x daily - 7 x weekly - 2 sets - 10 reps - Supine Single Arm Shoulder Protraction  - 1 x daily - 7 x weekly - 2 sets - 10 reps - Supine Shoulder Flexion AAROM with Hands Clasped  - 1 x daily - 7 x weekly - 2 sets - 10 reps - Seated Shoulder External Rotation PROM on Table  - 1 x daily - 7 x  weekly - 1 sets - 5 reps - 15 sec hold - Standing Shoulder Row with Anchored Resistance  - 1 x daily - 7 x weekly - 2 sets - 10 reps - Shoulder extension with resistance - Neutral  - 1 x daily - 7 x weekly - 2 sets - 10 reps  ASSESSMENT:  CLINICAL IMPRESSION: Progressed patient through strength and ROM interventions to work toward functional shoulder reaching with good scapular mechanics. Tactile feedback provided to keep scapula in good position during lifting movements. Progression made for postural strengthening to RTB with good form shown by patient. Pain with motion has decreased to 3 or 4 out of ten. ER has improved to 21 deg after PNF and stretching. Lyn Henri would benefit from skilled therapy to improve Right shoulder ROM, decrease pain and improve functional mobility and ability to perform ADLs.   OBJECTIVE IMPAIRMENTS: decreased activity tolerance, decreased mobility, decreased ROM, decreased strength, hypomobility, increased fascial restrictions, increased muscle spasms, impaired flexibility, impaired sensation, impaired UE functional use, postural dysfunction, and pain.   ACTIVITY LIMITATIONS: carrying, lifting, bending, sleeping, bed mobility, bathing, dressing, reach over head, and hygiene/grooming  PARTICIPATION LIMITATIONS: meal prep, cleaning, laundry, driving, shopping, community activity, and yard work  PERSONAL FACTORS: Age, Past/current experiences, Time since onset of injury/illness/exacerbation, and 3+ comorbidities: Anxiety, depression, history of arthritis/cervical disk disease, CAD, COPD, dyspnea on exertion, prostate cancer, squamous cell cancer of tongue, history of stroke, R RTC repair, R carotid stenosis, back pain.   are also affecting patient's functional outcome.   REHAB POTENTIAL: Good  CLINICAL DECISION MAKING: Evolving/moderate complexity  EVALUATION COMPLEXITY: Moderate  GOALS: Goals reviewed with patient? Yes  SHORT TERM GOALS: Target date:  11/26/2023   Patient will be independent with initial HEP.  Baseline: given Goal status: MET 11/12/23 met  2.  Patient will reports pain < 3/10 with ROM Baseline: 7-8/10 Goal status: IN PROGRESS - 3-4/10 11/21/23  3.  Patient will demonstrate ROM to 130 deg forward elevation and 30 deg ER  Baseline: 60 deg ff, 10 deg ER Goal status: IN PROGRESS 11/12/23 120 flexion, 21 deg ER on 11/21/23   LONG TERM GOALS: Target date: 12/24/2023   Patient will be independent with advanced/ongoing HEP to improve outcomes and carryover.  Baseline:  Goal status: IN PROGRESS  2.  Patient will report 75% improvement in Right shoulder pain to improve QOL.  Baseline:  Goal status: IN PROGRESS  3.   Patient to improve Right shoulder AROM to [facility] without pain provocation to allow for increased ease of ADLs.  Baseline: see objective Goal status: IN PROGRESS  4.  Patient will demonstrate improved functional UE strength as demonstrated by 4+/5 R shoulder strength. Baseline: see objective Goal status: IN PROGRESS  5.  Patient will report 10 points improvement on QuickDash to demonstrate improved functional ability.  Baseline: 61.4% impairment  Goal status: IN PROGRESS  6.  Patient will be able to be able to lay with arms overhead comfortably to undergo radiation treatment.    Baseline: unable Goal status: IN PROGRESS   PLAN:  PT FREQUENCY: 2x/week  PT DURATION: 8 weeks  PLANNED INTERVENTIONS: 97110-Therapeutic exercises, 97530- Therapeutic activity, [id number]- Neuromuscular re-education, 97535- Self Care, 16109- Manual therapy, 97014- Electrical stimulation (unattended), [id number]- Electrical stimulation (manual), 97016- Vasopneumatic device, [id number]- Ultrasound, [id number]- Ionotophoresis 4mg /ml Dexamethasone, Patient/Family education, Taping, Dry Needling, Joint mobilization, Joint manipulation, Spinal manipulation, Spinal mobilization, Cryotherapy, and Moist heat  PLAN FOR NEXT SESSION: gentle mobilization for ROM,  progress per protocol and tolerance, DOS 09/06/2023, 11 weeks post op as of 11/22/2023   Darleene Cleaver, PTA 11/21/2023, 12:48 PM

## 2023-11-26 ENCOUNTER — Encounter: Payer: Medicare Other | Admitting: Physical Therapy

## 2023-11-28 ENCOUNTER — Ambulatory Visit: Payer: Medicare Other

## 2023-11-28 DIAGNOSIS — M25511 Pain in right shoulder: Secondary | ICD-10-CM | POA: Diagnosis not present

## 2023-11-28 DIAGNOSIS — M25611 Stiffness of right shoulder, not elsewhere classified: Secondary | ICD-10-CM

## 2023-11-28 DIAGNOSIS — M6281 Muscle weakness (generalized): Secondary | ICD-10-CM

## 2023-11-28 NOTE — Therapy (Signed)
 OUTPATIENT PHYSICAL THERAPY SHOULDER TREATMENT   Patient Name: [name] MRN: [id number] DOB:August 24, 1951, 73 y.o., male Today's Date: 11/28/2023  END OF SESSION:  PT End of Session - 11/28/23 1702     Visit Number 8    Date for PT Re-Evaluation 12/24/23    Authorization Type Medicare + [employer] Supplement    Progress Note Due on Visit 10    PT Start Time 1617    PT Stop Time 1700    PT Time Calculation (min) 43 min    Activity Tolerance Patient tolerated treatment well    Behavior During Therapy WFL for tasks assessed/performed                   Past Medical History:  Diagnosis Date   ADHD (attention deficit hyperactivity disorder)    Anxiety    Arthritis    "NECK, HANDS, KNEES" (06/14/2016)   Atypical mole 08/07/2021   Mid Back (Atypical Proliferation)   CAD (coronary artery disease)    Carotid artery stenosis    Complication of anesthesia    Reports that he had trouble swallowing post anesthesia- CABG   COPD (chronic obstructive pulmonary disease) (HCC) 12/11/2011   r/s mv - EF 72%; exercise capacity 13 METS; no exercised induced ischemic EKG changes   Depression, major    Dyspnea    with exertion   GERD (gastroesophageal reflux disease)    HTN (hypertension), benign 04/16/2008   echo - EF >55%; no regional wall or valvular abnormalities   Hyperlipidemia    statin intolerant   Hypothyroidism    Migraine    "crippling headaches as a kid; silent migraines now, no pain, couple times/week" (06/14/2016)   Peripheral vascular disease (HCC)    Prostate cancer (HCC) 04/2019   recent dx - Dr [name]   S/P angioplasty with stent 06/14/16 PCI & DES of high grade AV groove LCX and 1st OM  06/15/2016   Squamous cell cancer of tongue (HCC) ~ 2002   "[facility] at [facility]"   Stroke [facility])    hx Thalamic Ischemic CVA   Subclavian steal syndrome 06/17/2012   carotid doppler - R systolic brachial pressure , L ; R subclavian artery -  proxmial obsstruction w/ abnormal monophasic waveforms, R ECA known occlusive disease; L ECA narrowing w/ 70-99% diameter reductiona   TIA (transient ischemic attack) 2005   Viral hepatitis 1970s   "non specific"   Past Surgical History:  Procedure Laterality Date   BACK SURGERY     BIOPSY  02/20/2018   Procedure: BIOPSY;  Surgeon: [name], MD;  Location: [facility];  Service: Endoscopy;;   BIOPSY TONGUE  ~ 2002   "base of tongue and uvula"   CARDIAC CATHETERIZATION  04/26/2008   L circumflex 99% stenosed midportion straddling a marginal branch; RCA total priximally w/ grade 2 L to R collaterals; renal arteries widely patent; LAD 30% segmental proximal stenosis   CARDIAC CATHETERIZATION N/A 05/14/2016   Procedure: Left Heart Cath and Cors/Grafts Angiography;  Surgeon: [name], MD;  Location: [facility] INVAS[facility] CV LAB;  Service: Cardiovascular;  Laterality: N/A;   CARDIAC CATHETERIZATION N/A 06/14/2016   Procedure: Coronary Stent Intervention;  Surgeon: [name], MD;  Location: [facility] INV[facility] CV [facility];  Service: Cardiovascular;  Laterality: N/A;   CERVICAL DISC SURGERY  2000   Kritzer    Posterior approach   COLONOSCOPY WITH PROPOFOL N/A 02/20/2018   Procedure: COLONOSCOPY WITH PROPOFOL;  Surgeon: [name], MD;  Location: [facility];  Service: Endoscopy;  Laterality: N/A;   CORONARY ANGIOPLASTY WITH STENT PLACEMENT     CORONARY ARTERY BYPASS GRAFT  2009   x5; LIMA to LAd, sequential vein to first diagonal branch, first and second obtuse marginal branches; vein to PDA   ESOPHAGOGASTRODUODENOSCOPY (EGD) WITH PROPOFOL N/A 02/20/2018   Procedure: ESOPHAGOGASTRODUODENOSCOPY (EGD) WITH PROPOFOL;  Surgeon: [name], MD;  Location: [facility];  Service: Endoscopy;  Laterality: N/A;   INSERTION OF MESH N/A 10/29/2017   Procedure: INSERTION OF MESH;  Surgeon: [name], MD;  Location: [facility] OR;  Service: General;  Laterality: N/A;   LUMBAR DISC SURGERY  06/17/2019    LUMBAR 2 & 3    LUMBAR LAMINECTOMY/DECOMPRESSION MICRODISCECTOMY N/A 06/17/2019   Procedure: Lumbar two -Lumbar three decompression and disectomy;  Surgeon: [name], MD;  Location: [facility] OR;  Service: Orthopedics;  Laterality: N/A;  3 hrs   POLYPECTOMY  02/20/2018   Procedure: POLYPECTOMY;  Surgeon: [name], MD;  Location: [facility];  Service: Endoscopy;;   REVERSE SHOULDER ARTHROPLASTY Right 09/06/2023   Procedure: REVERSE SHOULDER ARTHROPLASTY;  Surgeon: [name], MD;  Location: [facility] ORS;  Service: Orthopedics;  Laterality: Right;  interscalene  100   SHOULDER ARTHROSCOPY WITH SUBACROMIAL DECOMPRESSION AND OPEN ROTATOR C Left 09/28/2013   Procedure: LEFT SHOULDER ARTHROSCOPY WITH SUBACROMIAL DECOMPRESSION AND MINI OPEN ROTATOR CUFF REPAIR, OPEN DISTAL CLAVICLE RESECTION AND OPEN BICEP TENDODESIS ;  Surgeon: [name], MD;  Location: [facility];  Service: Orthopedics;  Laterality: Left;   TRANSCAROTID ARTERY REVASCULARIZATION  Right 05/05/2021   Procedure: RIGHT TRANSCAROTID ARTERY REVASCULARIZATION;  Surgeon: [name], MD;  Location: [facility] OR;  Service: Vascular;  Laterality: Right;   UMBILICAL HERNIA REPAIR N/A 10/29/2017   Procedure: UMBILICAL HERNIA REPAIR;  Surgeon: [name], MD;  Location: [facility] OR;  Service: General;  Laterality: N/A;   Patient Active Problem List   Diagnosis Date Noted   Carotid stenosis, right 05/05/2021   Former smoker 03/24/2020   SOB (shortness of breath) 03/24/2020   Lumbar disc herniation 06/17/2019   Right lumbar radiculopathy 05/16/2019   Elevated PSA 05/16/2019   S/P angioplasty with stent 06/14/16 PCI & DES of high grade AV groove LCX and 1st OM  06/15/2016   CAD (coronary artery disease) 06/14/2016   Abnormal stress test    Carotid artery disease (HCC) 05/20/2013   Hypothyroidism 03/29/2012   Erectile dysfunction 07/02/2011   Anxiety 07/02/2011   Back pain 07/02/2011   Cervical disc disease 07/02/2011   Coronary artery  disease 04/29/2011   Squamous cell carcinoma of tongue (HCC) 04/29/2011   Hypertension 04/29/2011   Depression 04/29/2011   GE reflux 04/29/2011   Internal carotid artery stenosis 04/29/2011   Ptosis of eyelid, left 04/29/2011   Hyperlipidemia 04/29/2011   History of ischemic vertebrobasilar artery thalamic stroke 04/29/2011   Attention deficit disorder 04/29/2011   Degenerative joint disease of cervical spine 04/29/2011    PCP: Margaree Mackintosh, MD    REFERRING PROVIDER: [name], MD  REFERRING DIAG:  Z47.89 (ICD-10-CM) - Orthopedic aftercare  [phone] (ICD-10-CM) - S/p reverse total shoulder arthroplasty    THERAPY DIAG:  Acute pain of right shoulder  Stiffness of right shoulder, not elsewhere classified  Muscle weakness (generalized)  Rationale for Evaluation and Treatment: Rehabilitation  ONSET DATE: DOS R shoulder rTSA 09/06/2023  SUBJECTIVE:  SUBJECTIVE STATEMENT: Accompanied by wife today. Pt reports less pain today when rasing his arm. Hand dominance: Left  NEXT MD VISIT:  December 04, 2023  PERTINENT HISTORY: Anxiety, depression, history of arthritis/cervical disk disease, CAD, COPD, dyspnea on exertion, prostate cancer, squamous cell cancer of tongue, history of stroke, R RTC repair, R carotid stenosis, back pain.   PAIN:  Are you having pain? Yes: NPRS scale: 3-4/10 Pain location: R anterior shoulder  Pain description: sharp, achey Aggravating factors: moving wrong, sleeping on it it. Relieving factors: Percocet, muscle relaxers  PRECAUTIONS: None  RED FLAGS: None   WEIGHT BEARING RESTRICTIONS: No  FALLS:  Has patient fallen in last 6 months?  Occasionally tripping over the dogs.   LIVING ENVIRONMENT: Lives with: lives with their spouse Lives in:  House/apartment Stairs: Yes: Internal: 14 steps; on left going up and External: 2 steps; on left going up Has following equipment at home: None  OCCUPATION: retired  PLOF: Independent and Leisure: woodworking  PATIENT GOALS: getting back to doing stuff and not being miserable.  Also has a couple of nodules in lungs, needs to be able to have arms up.   OBJECTIVE:   DIAGNOSTIC FINDINGS:  10/22/2023 Imaging: AP and scapular Y views of the right shoulder show a well-fixed and well-positioned right reverse TSA. No periprosthetic fractures or acromial fractures noted.    PATIENT SURVEYS:  Quick Dash 61.4%  COGNITION: Overall cognitive status: Within functional limits for tasks assessed     SENSATION: Reports pins/needles at night in R hand  POSTURE: Forward head, rounded shoulders.   UPPER EXTREMITY ROM:   Active ROM Right eval Left eval R 11/07/23 R 11/12/23 R 11/21/23  Shoulder flexion 60 135 100 120 108  Shoulder extension 35 45 35    Shoulder abduction 45 85 94 90 100  Shoulder adduction       Shoulder internal rotation 50 F- to ear 65 F- back of head  FIR- to glutes    Shoulder external rotation 10 F-to hip 70 F-small of back FER- to UT muscle 10 21  (Blank rows = not tested)  UPPER EXTREMITY MMT:  MMT Right eval Left eval  Shoulder flexion 2 5  Shoulder extension 2   Shoulder abduction 2 5  Shoulder internal rotation 2 5  Shoulder external rotation 2 5  Elbow flexion 4 5  Elbow extension 4p! 5  Wrist flexion    Wrist extension    Grip strength (lbs)    (Blank rows = not tested)  JOINT MOBILITY TESTING:  Decreased mobility all directions in [facility] joint  PALPATION:   Tenderness posterior R shoulder.   TODAY'S TREATMENT:                                                                                                                                         DATE: 11/28/23 Shoulder AAROM wall slide into flexion  x 10 AROM wall slide flexion 2x5 Standing  ER with wand 2 x 10  Isometric ER YTB -p! Isometric IR YTB x 5x5"  STM to middle and posterior deltoids Shoulder mobilization into end range ER   11/21/23 Shoulder AAROM wall slide into flexion x 10 Finger ladder x 5 flexion Standing rows YTB 2x10 Standing shoulder ext YTB 2x10 Up/down and side to side stabilization with orange pball x 10 PROM stretching for R shoulder all directions; PNF contract/relax for ER Measured shoulder ROM 11/19/23 Therapeutic Activity: to improve functional performance. Shoulder AAROM wall slide into flexion x 10 Standing rows YTB x 10 Standing shoulder ext YTB x 10 Standing shoulder AAROM flexion with wand x 10 Shoulder abduction AAROM with wand x 10 Standing ER with wand x 10 AAROM Manual Therapy: to decrease muscle spasm and pain and improve mobility STM to R UT, middle and pot deltoids Passive stretching for ER flexion and abd 11/14/23 Therapeutic Exercise: to improve strength and mobility.  Demo, verbal and tactile cues throughout for technique. Supine R shoulder ER  with wand x 10 Supine chest press with wand x 12 Seated rows YTB x 10 Seated shoulder extension YTB x 10- cues for posture Seated AAROM prayer raise x 10  Seated abduction with wand x 10  Manual Therapy: to decrease muscle spasm and pain and improve mobility Mobs to [facility] joint all direction PROM using short lever arm to decrease discomfort  11/12/23 Therapeutic Exercise: to improve strength and mobility.  Demo, verbal and tactile cues throughout for technique. Pulleys x 2 min flexion - reports stabbing pain in R shoulder Table slides flexion - better tolerated Windshield wipers on table - not getting ER, IR only Scapular punches 2 x 10  AAROM shoulder flexion with hands clasped Supine shoulder ER with cane supine Seated ER at table - tolerated well Manual Therapy: to decrease muscle spasm and pain and improve mobility Mobs to [facility] joint all direction, traction and ER to [facility] joint  using short lever arm to decrease discomfort, STM/TPR to pectoralis, deltoids.    11/07/23  Therapeutic Exercise: to improve strength and mobility.  Demo, verbal and tactile cues throughout for technique.  R shoulder PROM flex, abd, ER gentle stretching Supine shoulder flexion with wand x 5 Supine chest press with wand x 10 Supine R ER with wand x 10 Supine R shoulder abduction with wand x 10 Seated R shoulder AA flexion with cane x 10 Pulleys review  11/05/23 Therapeutic Exercise: to improve strength and mobility.  Demo, verbal and tactile cues throughout for technique. AAROM flexion with green pball 2 x 5 Scap retraction x 15 Supine shoulder flexion AA with dowel 2x5 PROM to R shoulder very gentle all directions, very limited ROM esp ER Seated shoulder swifer flexion x 5; scaption x 5 Seated ER AA with dowel x 5- increased pain after Manual Therapy: to decrease muscle spasm, pain and improve mobility.  STM to R anterior, lateral, posterior deltoids     PATIENT EDUCATION: Education details: HEP update Person educated: Patient Education method: Explanation, Demonstration, Verbal cues, and Handouts Education comprehension: verbalized understanding and returned demonstration  HOME EXERCISE PROGRAM: Access Code: [email or web address] URL: [email or web address] Date: 11/19/2023 Prepared by: Verta Ellen  Exercises - Seated Shoulder Flexion Towel Slide at Table Top  - 1 x daily - 7 x weekly - 1 sets - 10 reps - Seated Scapular Retraction  - 1 x daily - 7 x weekly - 1 sets - 5 reps -  5 sec hold - Seated Shoulder Flexion Extension AAROM with Dowel into Wall  - 3 x daily - 7 x weekly - 1 sets - 10 reps - Supine Shoulder Flexion Extension AAROM with Dowel  - 1 x daily - 7 x weekly - 2 sets - 10 reps - Supine Shoulder Scaption with Dowel  - 1 x daily - 7 x weekly - 2 sets - 10 reps - Supine Shoulder External Rotation with Dowel  - 1 x daily - 7 x weekly - 2 sets - 10 reps - Supine  Single Arm Shoulder Protraction  - 1 x daily - 7 x weekly - 2 sets - 10 reps - Supine Shoulder Flexion AAROM with Hands Clasped  - 1 x daily - 7 x weekly - 2 sets - 10 reps - Seated Shoulder External Rotation PROM on Table  - 1 x daily - 7 x weekly - 1 sets - 5 reps - 15 sec hold - Standing Shoulder Row with Anchored Resistance  - 1 x daily - 7 x weekly - 2 sets - 10 reps - Shoulder extension with resistance - Neutral  - 1 x daily - 7 x weekly - 2 sets - 10 reps  ASSESSMENT:  CLINICAL IMPRESSION: Progressed patient through strength and ROM interventions to work toward functional shoulder reaching with good scapular mechanics. ER remains pretty restricted but he is able to participate in more functional reaching interventions. He continues to have muscle restrictions in his deltoids as well. Pain is now 3/10 with R shoulder flexion. Pt is progressing well. Lyn Henri would benefit from skilled therapy to improve Right shoulder ROM, decrease pain and improve functional mobility and ability to perform ADLs.   OBJECTIVE IMPAIRMENTS: decreased activity tolerance, decreased mobility, decreased ROM, decreased strength, hypomobility, increased fascial restrictions, increased muscle spasms, impaired flexibility, impaired sensation, impaired UE functional use, postural dysfunction, and pain.   ACTIVITY LIMITATIONS: carrying, lifting, bending, sleeping, bed mobility, bathing, dressing, reach over head, and hygiene/grooming  PARTICIPATION LIMITATIONS: meal prep, cleaning, laundry, driving, shopping, community activity, and yard work  PERSONAL FACTORS: Age, Past/current experiences, Time since onset of injury/illness/exacerbation, and 3+ comorbidities: Anxiety, depression, history of arthritis/cervical disk disease, CAD, COPD, dyspnea on exertion, prostate cancer, squamous cell cancer of tongue, history of stroke, R RTC repair, R carotid stenosis, back pain.   are also affecting patient's functional  outcome.   REHAB POTENTIAL: Good  CLINICAL DECISION MAKING: Evolving/moderate complexity  EVALUATION COMPLEXITY: Moderate  GOALS: Goals reviewed with patient? Yes  SHORT TERM GOALS: Target date: 11/26/2023   Patient will be independent with initial HEP.  Baseline: given Goal status: MET 11/12/23 met  2.  Patient will reports pain < 3/10 with ROM Baseline: 7-8/10 Goal status: IN PROGRESS - 3/10 11/21/23  3.  Patient will demonstrate ROM to 130 deg forward elevation and 30 deg ER  Baseline: 60 deg ff, 10 deg ER Goal status: IN PROGRESS 11/12/23 120 flexion, 21 deg ER on 11/21/23   LONG TERM GOALS: Target date: 12/24/2023   Patient will be independent with advanced/ongoing HEP to improve outcomes and carryover.  Baseline:  Goal status: IN PROGRESS  2.  Patient will report 75% improvement in Right shoulder pain to improve QOL.  Baseline:  Goal status: IN PROGRESS  3.   Patient to improve Right shoulder AROM to [facility] without pain provocation to allow for increased ease of ADLs.  Baseline: see objective Goal status: IN PROGRESS  4.  Patient will demonstrate improved  functional UE strength as demonstrated by 4+/5 R shoulder strength. Baseline: see objective Goal status: IN PROGRESS  5.  Patient will report 10 points improvement on QuickDash to demonstrate improved functional ability.  Baseline: 61.4% impairment Goal status: IN PROGRESS  6.  Patient will be able to be able to lay with arms overhead comfortably to undergo radiation treatment.    Baseline: unable Goal status: IN PROGRESS   PLAN:  PT FREQUENCY: 2x/week  PT DURATION: 8 weeks  PLANNED INTERVENTIONS: 97110-Therapeutic exercises, 97530- Therapeutic activity, [id number]- Neuromuscular re-education, 97535- Self Care, 65784- Manual therapy, 97014- Electrical stimulation (unattended), [id number]- Electrical stimulation (manual), 97016- Vasopneumatic device, [id number]- Ultrasound, [id number]- Ionotophoresis 4mg /ml Dexamethasone,  Patient/Family education, Taping, Dry Needling, Joint mobilization, Joint manipulation, Spinal manipulation, Spinal mobilization, Cryotherapy, and Moist heat  PLAN FOR NEXT SESSION: gentle mobilization for ROM, progress per protocol and tolerance, DOS 09/06/2023, 11 weeks post op as of 11/22/2023   Darleene Cleaver, PTA 11/28/2023, 5:23 PM

## 2023-11-29 ENCOUNTER — Ambulatory Visit: Payer: Medicare Other | Admitting: Internal Medicine

## 2023-11-29 ENCOUNTER — Encounter: Payer: Self-pay | Admitting: Internal Medicine

## 2023-11-29 VITALS — BP 120/80 | HR 66 | Ht 67.0 in | Wt 149.0 lb

## 2023-11-29 DIAGNOSIS — R251 Tremor, unspecified: Secondary | ICD-10-CM | POA: Diagnosis not present

## 2023-11-29 DIAGNOSIS — I10 Essential (primary) hypertension: Secondary | ICD-10-CM | POA: Diagnosis not present

## 2023-11-29 DIAGNOSIS — R5383 Other fatigue: Secondary | ICD-10-CM

## 2023-11-29 DIAGNOSIS — Z7901 Long term (current) use of anticoagulants: Secondary | ICD-10-CM

## 2023-11-29 DIAGNOSIS — C139 Malignant neoplasm of hypopharynx, unspecified: Secondary | ICD-10-CM

## 2023-11-29 DIAGNOSIS — F419 Anxiety disorder, unspecified: Secondary | ICD-10-CM

## 2023-11-29 DIAGNOSIS — E039 Hypothyroidism, unspecified: Secondary | ICD-10-CM

## 2023-11-29 DIAGNOSIS — C61 Malignant neoplasm of prostate: Secondary | ICD-10-CM

## 2023-11-29 DIAGNOSIS — K224 Dyskinesia of esophagus: Secondary | ICD-10-CM

## 2023-11-29 DIAGNOSIS — R531 Weakness: Secondary | ICD-10-CM

## 2023-11-29 DIAGNOSIS — C131 Malignant neoplasm of aryepiglottic fold, hypopharyngeal aspect: Secondary | ICD-10-CM

## 2023-11-29 DIAGNOSIS — Z8673 Personal history of transient ischemic attack (TIA), and cerebral infarction without residual deficits: Secondary | ICD-10-CM

## 2023-11-29 DIAGNOSIS — I771 Stricture of artery: Secondary | ICD-10-CM

## 2023-11-29 DIAGNOSIS — Z789 Other specified health status: Secondary | ICD-10-CM

## 2023-11-29 DIAGNOSIS — K2100 Gastro-esophageal reflux disease with esophagitis, without bleeding: Secondary | ICD-10-CM

## 2023-11-29 DIAGNOSIS — Z8546 Personal history of malignant neoplasm of prostate: Secondary | ICD-10-CM

## 2023-11-29 DIAGNOSIS — D0007 Carcinoma in situ of tongue: Secondary | ICD-10-CM

## 2023-11-29 DIAGNOSIS — Z951 Presence of aortocoronary bypass graft: Secondary | ICD-10-CM

## 2023-11-29 DIAGNOSIS — I6521 Occlusion and stenosis of right carotid artery: Secondary | ICD-10-CM

## 2023-11-29 NOTE — Patient Instructions (Addendum)
 Regarding hand tremor, this is new onset and concerning to pt. Referral to Neurology for evaluation.

## 2023-11-29 NOTE — Progress Notes (Signed)
 Patient Care Team: Gregory Mackintosh, MD as PCP - General (Internal Medicine) Gregory Gess, MD as PCP - Cardiology (Cardiology) Gregory Rudd, MD as Consulting Physician (General Surgery) Gregory Harder, MD (Inactive) as Consulting Physician (Dermatology)  Visit Date: 11/29/23  Subjective:   Chief Complaint  Patient presents with   Tremor in hands    Noticed it about a month ago, no pain.   Patient UE:[id number] C [name],[name] DOB:15-Aug-1951,73 y.o. XBJ:[id number]   [name], here with his wife who is providing some history, presents today for acute visit with Tremor in Hands Bilaterally. He reports that bilateral tremors had onset a couple of months ago, and has noticed that they're worse when trying to do anything, but not so much when he is at rest.  Past Medical History:  Diagnosis Date   ADHD (attention deficit hyperactivity disorder)    Anxiety    Arthritis    "NECK, HANDS, KNEES" (06/14/2016)   Atypical mole 08/07/2021   Mid Back (Atypical Proliferation)   CAD (coronary artery disease)    Carotid artery stenosis    Complication of anesthesia    Reports that he had trouble swallowing post anesthesia- CABG   COPD (chronic obstructive pulmonary disease) (HCC) 12/11/2011   r/s mv - EF 72%; exercise capacity 13 METS; no exercised induced ischemic EKG changes   Depression, major    Dyspnea    with exertion   GERD (gastroesophageal reflux disease)    HTN (hypertension), benign 04/16/2008   echo - EF >55%; no regional wall or valvular abnormalities   Hyperlipidemia    statin intolerant   Hypothyroidism    Migraine    "crippling headaches as a kid; silent migraines now, no pain, couple times/week" (06/14/2016)   Peripheral vascular disease (HCC)    Prostate cancer (HCC) 04/2019   recent dx - Dr [name]   S/P angioplasty with stent 06/14/16 PCI & DES of high grade AV groove LCX and 1st OM  06/15/2016   Squamous cell cancer of tongue (HCC) ~ 2002   "35 radiation  treatments at [facility]   Stroke [facility])    hx Thalamic Ischemic CVA   Subclavian steal syndrome 06/17/2012   carotid doppler - R systolic brachial pressure , L ; R subclavian artery - proxmial obsstruction w/ abnormal monophasic waveforms, R ECA known occlusive disease; L ECA narrowing w/ 70-99% diameter reductiona   TIA (transient ischemic attack) 2005   Viral hepatitis 1970s   "non specific"    Allergies  Allergen Reactions   Crestor [Rosuvastatin Calcium] Other (See Comments)    Difficulty swallowing   Tramadol Shortness Of Breath   Lisinopril Cough   Gabapentin Other (See Comments)    Hallucinations   Tricor [Fenofibrate] Other (See Comments)    UNSPECIFIED REACTION   Lipitor [Atorvastatin Calcium] Other (See Comments)    Muscle spasms    Family History  Problem Relation Age of Onset   Cancer Mother        ovarian   Stroke Father    Diabetes Father    Arthritis Father    Hypertension Brother    Social Hx: He is married. First marriage ended in divorce. He is retired from [employer]. One adult son. Current wife, [name] worked at [employer] in [employer].  Review of Systems  Neurological:  Positive for tremors (hands, bilaterally).     Objective:  Vitals: BP (!) 140/90   Pulse 66   Ht 5\' 7"  (1.702 m)  Wt 149 lb (67.6 kg)   SpO2 98%   BMI 23.34 kg/m   Physical Exam Vitals and nursing note reviewed.  Constitutional:      General: He is not in acute distress.    Appearance: Normal appearance. He is not ill-appearing.  HENT:     Head: Normocephalic and atraumatic.  Pulmonary:     Effort: Pulmonary effort is normal.  Musculoskeletal:     Right shoulder: Normal range of motion.     Left shoulder: Normal range of motion.     Right elbow: Normal range of motion.     Left elbow: Normal range of motion.     Right wrist: Normal range of motion.     Left wrist: Normal range of motion.     Right hand: Normal range of motion.     Left hand: Normal  range of motion.     Comments: No cogwheel rigidity  Skin:    General: Skin is warm and dry.  Neurological:     Mental Status: He is alert and oriented to person, place, and time. Mental status is at baseline.  Psychiatric:        Mood and Affect: Mood normal.        Behavior: Behavior normal.        Thought Content: Thought content normal.        Judgment: Judgment normal.    Results:  Studies Obtained And Personally Reviewed By Me: Labs:     Component Value Date/Time   NA 143 08/27/2023 1157   K 4.0 08/27/2023 1157   CL 101 08/27/2023 1157   CO2 27 08/27/2023 1157   GLUCOSE 96 08/27/2023 1157   BUN 24 (H) 08/27/2023 1157   CREATININE 1.41 (H) 08/27/2023 1157   CREATININE 1.14 06/11/2023 0945   CALCIUM 9.7 08/27/2023 1157   PROT 6.1 06/11/2023 0945   ALBUMIN 3.6 05/05/2021 0852   AST 14 06/11/2023 0945   ALT 18 06/11/2023 0945   ALKPHOS 57 05/05/2021 0852   BILITOT 0.3 06/11/2023 0945   GFRNONAA 53 (L) 08/27/2023 1157   GFRNONAA 69 03/30/2021 0950   GFRAA 80 03/30/2021 0950    Lab Results  Component Value Date   WBC 6.7 08/27/2023   HGB 17.1 (H) 08/27/2023   HCT 50.1 08/27/2023   MCV 92.4 08/27/2023   PLT 179 08/27/2023   Lab Results  Component Value Date   CHOL 120 06/11/2023   HDL 49 06/11/2023   LDLCALC 48 06/11/2023   TRIG 142 06/11/2023   CHOLHDL 2.4 06/11/2023   Lab Results  Component Value Date   HGBA1C 5.5 01/01/2017    Lab Results  Component Value Date   TSH 3.07 06/14/2023    Lab Results  Component Value Date   PSA <0.04 06/11/2023   PSA <0.04 04/27/2022   PSA <0.04 03/30/2021     Assessment & Plan:    Right hand tremor-  ? Essential tremor vs Parkinson's disease. Seems to be more of an intention tremor to me. Does not appear to have cog-wheel rigidity. Suggest with his complex history  and on -going Oncology treatments at [facility] that he be evaluated by Neurologist. He is agreeable to that. Will make referral.  Hx of malignant neoplasm  squamous cell carcinoma of hypopharynx  2023 seen at [facility]. Had larynopharyngectomy. Has voice device for speaking.  Hx of right reverse shoulder arthroplasty by Dr. [name] here in [facility] December 2024  Hyperlipidemia treated with Repatha  Hx od  CAD s/p CABG followed by Cardiology  Hx of COPD  Hx of smoking  Hypothyroidism is on thyroid replacement therapy  Hx of prostate cancer  HTN treated with low dose amlodipine  Remote hx of TIA  Subclavian steal syndrome  Anxiety and depression  Hx of attention deficit disorder  Hx of carotid stenosis  Hx of attention deficit disorder  Hx of malignant liver lesion treated in 2023  Statin intolerance  GERD  Plan: Referral to [facility]rology for evaluation of tremor. Patient and wife are agreeable.      Reports his hand tremor onset a couple of months ago, and he has noticed that symptoms are worse when trying to do anything, but not so much when he is at rest. No cogwheel rigidity noted on exam, but would like to have Neurology evaluate him.    I,Emily Lagle,acting as a Neurosurgeon for Gregory Mackintosh, MD.,have documented all relevant documentation on the behalf of Gregory Mackintosh, MD,as directed by  Gregory Mackintosh, MD while in the presence of Gregory Mackintosh, MD.   I, Gregory Mackintosh, MD, have reviewed all documentation for this visit. The documentation on 11/29/23 for the exam, diagnosis, procedures, and orders are all accurate and complete.

## 2023-12-03 ENCOUNTER — Ambulatory Visit: Payer: Medicare Other | Attending: Orthopedic Surgery

## 2023-12-03 DIAGNOSIS — M25511 Pain in right shoulder: Secondary | ICD-10-CM | POA: Insufficient documentation

## 2023-12-03 DIAGNOSIS — M25611 Stiffness of right shoulder, not elsewhere classified: Secondary | ICD-10-CM | POA: Diagnosis present

## 2023-12-03 DIAGNOSIS — M6281 Muscle weakness (generalized): Secondary | ICD-10-CM | POA: Insufficient documentation

## 2023-12-03 NOTE — Therapy (Signed)
 OUTPATIENT PHYSICAL THERAPY SHOULDER TREATMENT   Patient Name: TAMAJ JURGENS MRN: [id number] DOB:[birth year], 73 y.o., male Today's Date: 12/03/2023  END OF SESSION:  PT End of Session - 12/03/23 1329     Visit Number 9    Date for PT Re-Evaluation 12/24/23    Authorization Type Medicare + [employer] Supplement    Progress Note Due on Visit 10    PT Start Time 1315    PT Stop Time 1400    PT Time Calculation (min) 45 min    Activity Tolerance Patient tolerated treatment well    Behavior During Therapy WFL for tasks assessed/performed                    Past Medical History:  Diagnosis Date   ADHD (attention deficit hyperactivity disorder)    Anxiety    Arthritis    "NECK, HANDS, KNEES" (06/14/2016)   Atypical mole 08/07/2021   Mid Back (Atypical Proliferation)   CAD (coronary artery disease)    Carotid artery stenosis    Complication of anesthesia    Reports that he had trouble swallowing post anesthesia- CABG   COPD (chronic obstructive pulmonary disease) (HCC) 12/11/2011   r/s mv - EF 72%; exercise capacity 13 METS; no exercised induced ischemic EKG changes   Depression, major    Dyspnea    with exertion   GERD (gastroesophageal reflux disease)    HTN (hypertension), benign 04/16/2008   echo - EF >55%; no regional wall or valvular abnormalities   Hyperlipidemia    statin intolerant   Hypothyroidism    Migraine    "crippling headaches as a kid; silent migraines now, no pain, couple times/week" (06/14/2016)   Peripheral vascular disease (HCC)    Prostate cancer (HCC) 04/2019   recent dx - Dr [name]   S/P angioplasty with stent 06/14/16 PCI & DES of high grade AV groove LCX and 1st OM  06/15/2016   Squamous cell cancer of tongue (HCC) ~ 2002   "35 radiation treatments at [facility]"   Stroke [facility])    hx Thalamic Ischemic CVA   Subclavian steal syndrome 06/17/2012   carotid doppler - R systolic brachial pressure , L ; R subclavian artery -  proxmial obsstruction w/ abnormal monophasic waveforms, R ECA known occlusive disease; L ECA narrowing w/ 70-99% diameter reductiona   TIA (transient ischemic attack) 2005   Viral hepatitis 1970s   "non specific"   Past Surgical History:  Procedure Laterality Date   BACK SURGERY     BIOPSY  02/20/2018   Procedure: BIOPSY;  Surgeon: [name], MD;  Location: [facility];  Service: Endoscopy;;   BIOPSY TONGUE  ~ 2002   "base of tongue and uvula"   CARDIAC CATHETERIZATION  04/26/2008   L circumflex 99% stenosed midportion straddling a marginal branch; RCA total priximally w/ grade 2 L to R collaterals; renal arteries widely patent; LAD 30% segmental proximal stenosis   CARDIAC CATHETERIZATION N/A 05/14/2016   Procedure: Left Heart Cath and Cors/Grafts Angiography;  Surgeon: [name], MD;  Location: [facility];  Service: Cardiovascular;  Laterality: N/A;   CARDIAC CATHETERIZATION N/A 06/14/2016   Procedure: Coronary Stent Intervention;  Surgeon: [name], MD;  Location: [facility];  Service: Cardiovascular;  Laterality: N/A;   CERVICAL DISC SURGERY  2000   Kritzer    Posterior approach   COLONOSCOPY WITH PROPOFOL N/A 02/20/2018   Procedure: COLONOSCOPY WITH PROPOFOL;  Surgeon: [name],  MD;  Location: [facility];  Service: Endoscopy;  Laterality: N/A;   CORONARY ANGIOPLASTY WITH STENT PLACEMENT     CORONARY ARTERY BYPASS GRAFT  2009   x5; LIMA to LAd, sequential vein to first diagonal branch, first and second obtuse marginal branches; vein to PDA   ESOPHAGOGASTRODUODENOSCOPY (EGD) WITH PROPOFOL N/A 02/20/2018   Procedure: ESOPHAGOGASTRODUODENOSCOPY (EGD) WITH PROPOFOL;  Surgeon: [name], MD;  Location: [facility];  Service: Endoscopy;  Laterality: N/A;   INSERTION OF MESH N/A 10/29/2017   Procedure: INSERTION OF MESH;  Surgeon: [name], MD;  Location: [facility] OR;  Service: General;  Laterality: N/A;   LUMBAR DISC SURGERY  06/17/2019    LUMBAR 2 & 3    LUMBAR LAMINECTOMY/DECOMPRESSION MICRODISCECTOMY N/A 06/17/2019   Procedure: Lumbar two -Lumbar three decompression and disectomy;  Surgeon: [name], MD;  Location: [facility] OR;  Service: Orthopedics;  Laterality: N/A;  3 hrs   POLYPECTOMY  02/20/2018   Procedure: POLYPECTOMY;  Surgeon: [name], MD;  Location: [facility];  Service: Endoscopy;;   REVERSE SHOULDER ARTHROPLASTY Right 09/06/2023   Procedure: REVERSE SHOULDER ARTHROPLASTY;  Surgeon: [name], MD;  Location: [facility];  Service: Orthopedics;  Laterality: Right;  interscalene  100   SHOULDER ARTHROSCOPY WITH SUBACROMIAL DECOMPRESSION AND OPEN ROTATOR C Left 09/28/2013   Procedure: LEFT SHOULDER ARTHROSCOPY WITH SUBACROMIAL DECOMPRESSION AND MINI OPEN ROTATOR CUFF REPAIR, OPEN DISTAL CLAVICLE RESECTION AND OPEN BICEP TENDODESIS ;  Surgeon: [name], MD;  Location: [facility] OR;  Service: Orthopedics;  Laterality: Left;   TRANSCAROTID ARTERY REVASCULARIZATION  Right 05/05/2021   Procedure: RIGHT TRANSCAROTID ARTERY REVASCULARIZATION;  Surgeon: [name], MD;  Location: [facility] OR;  Service: Vascular;  Laterality: Right;   UMBILICAL HERNIA REPAIR N/A 10/29/2017   Procedure: UMBILICAL HERNIA REPAIR;  Surgeon: [name], MD;  Location: [facility] OR;  Service: General;  Laterality: N/A;   Patient Active Problem List   Diagnosis Date Noted   Carotid stenosis, right 05/05/2021   Former smoker 03/24/2020   SOB (shortness of breath) 03/24/2020   Lumbar disc herniation 06/17/2019   Right lumbar radiculopathy 05/16/2019   Elevated PSA 05/16/2019   S/P angioplasty with stent 06/14/16 PCI & DES of high grade AV groove LCX and 1st OM  06/15/2016   CAD (coronary artery disease) 06/14/2016   Abnormal stress test    Carotid artery disease (HCC) 05/20/2013   Hypothyroidism 03/29/2012   Erectile dysfunction 07/02/2011   Anxiety 07/02/2011   Back pain 07/02/2011   Cervical disc disease 07/02/2011   Coronary artery  disease 04/29/2011   Squamous cell carcinoma of tongue (HCC) 04/29/2011   Hypertension 04/29/2011   Depression 04/29/2011   GE reflux 04/29/2011   Internal carotid artery stenosis 04/29/2011   Ptosis of eyelid, left 04/29/2011   Hyperlipidemia 04/29/2011   History of ischemic vertebrobasilar artery thalamic stroke 04/29/2011   Attention deficit disorder 04/29/2011   Degenerative joint disease of cervical spine 04/29/2011    PCP: Margaree Mackintosh, MD    REFERRING PROVIDER: [name], MD  REFERRING DIAG:  Z47.89 (ICD-10-CM) - Orthopedic aftercare  [phone] (ICD-10-CM) - S/p reverse total shoulder arthroplasty    THERAPY DIAG:  Acute pain of right shoulder  Stiffness of right shoulder, not elsewhere classified  Muscle weakness (generalized)  Rationale for Evaluation and Treatment: Rehabilitation  ONSET DATE: DOS R shoulder rTSA 09/06/2023  SUBJECTIVE:  SUBJECTIVE STATEMENT: Accompanied by wife today. Pt reports Sunday he could barely move his arm, yesterday not as bad. He points to pain in his R bicep Hand dominance: Left  NEXT MD VISIT:  December 04, 2023  PERTINENT HISTORY: Anxiety, depression, history of arthritis/cervical disk disease, CAD, COPD, dyspnea on exertion, prostate cancer, squamous cell cancer of tongue, history of stroke, R RTC repair, R carotid stenosis, back pain.   PAIN:  Are you having pain? Yes: NPRS scale: 5/10 Pain location: R anterior shoulder and bicep Pain description: sharp, achey Aggravating factors: moving wrong, sleeping on it it. Relieving factors: Percocet, muscle relaxers  PRECAUTIONS: None  RED FLAGS: None   WEIGHT BEARING RESTRICTIONS: No  FALLS:  Has patient fallen in last 6 months?  Occasionally tripping over the dogs.   LIVING ENVIRONMENT: Lives  with: lives with their spouse Lives in: House/apartment Stairs: Yes: Internal: 14 steps; on left going up and External: 2 steps; on left going up Has following equipment at home: None  OCCUPATION: retired  PLOF: Independent and Leisure: woodworking  PATIENT GOALS: getting back to doing stuff and not being miserable.  Also has a couple of nodules in lungs, needs to be able to have arms up.   OBJECTIVE:   DIAGNOSTIC FINDINGS:  10/22/2023 Imaging: AP and scapular Y views of the right shoulder show a well-fixed and well-positioned right reverse TSA. No periprosthetic fractures or acromial fractures noted.    PATIENT SURVEYS:  Quick Dash 61.4%  COGNITION: Overall cognitive status: Within functional limits for tasks assessed     SENSATION: Reports pins/needles at night in R hand  POSTURE: Forward head, rounded shoulders.   UPPER EXTREMITY ROM:   Active ROM Right eval Left eval R 11/07/23 R 11/12/23 R 11/21/23  Shoulder flexion 60 135 100 120 108  Shoulder extension 35 45 35    Shoulder abduction 45 85 94 90 100  Shoulder adduction       Shoulder internal rotation 50 F- to ear 65 F- back of head  FIR- to glutes    Shoulder external rotation 10 F-to hip 70 F-small of back FER- to UT muscle 10 21  (Blank rows = not tested)  UPPER EXTREMITY MMT:  MMT Right eval Left eval R 12/03/23  Shoulder flexion 2 5 3+  Shoulder extension 2    Shoulder abduction 2 5 3+  Shoulder internal rotation 2 5 3+  Shoulder external rotation 2 5 3+  Elbow flexion 4 5 5   Elbow extension 4p! 5 5  Wrist flexion     Wrist extension     Grip strength (lbs)     (Blank rows = not tested)  JOINT MOBILITY TESTING:  Decreased mobility all directions in [facility] joint  PALPATION:   Tenderness posterior R shoulder.   TODAY'S TREATMENT:  DATE: 12/03/23 Therapeutic  Exercise: to improve strength and mobility.  Demo, verbal and tactile cues throughout for technique.  Pulleys: 1 min flexion, 1 min scaption Supine shoulder flexion with 1lb x 10 Serratus punch 1lb x 10 MMT Wall slide flexion 1lb cuff weight 2x5 Isometric step out IR YTB x 10; ER YTB x 10 Rows RTB x 10- cues to avoid bicep curling Shoulder ext RTB x x 10  11/28/23 Shoulder AAROM wall slide into flexion x 10 AROM wall slide flexion 2x5 Standing ER with wand 2 x 10  Isometric ER YTB -p! Isometric IR YTB x 5x5"  STM to middle and posterior deltoids Shoulder mobilization into end range ER   11/21/23 Shoulder AAROM wall slide into flexion x 10 Finger ladder x 5 flexion Standing rows YTB 2x10 Standing shoulder ext YTB 2x10 Up/down and side to side stabilization with orange pball x 10 PROM stretching for R shoulder all directions; PNF contract/relax for ER Measured shoulder ROM 11/19/23 Therapeutic Activity: to improve functional performance. Shoulder AAROM wall slide into flexion x 10 Standing rows YTB x 10 Standing shoulder ext YTB x 10 Standing shoulder AAROM flexion with wand x 10 Shoulder abduction AAROM with wand x 10 Standing ER with wand x 10 AAROM Manual Therapy: to decrease muscle spasm and pain and improve mobility STM to R UT, middle and pot deltoids Passive stretching for ER flexion and abd 11/14/23 Therapeutic Exercise: to improve strength and mobility.  Demo, verbal and tactile cues throughout for technique. Supine R shoulder ER  with wand x 10 Supine chest press with wand x 12 Seated rows YTB x 10 Seated shoulder extension YTB x 10- cues for posture Seated AAROM prayer raise x 10  Seated abduction with wand x 10  Manual Therapy: to decrease muscle spasm and pain and improve mobility Mobs to [facility] joint all direction PROM using short lever arm to decrease discomfort  11/12/23 Therapeutic Exercise: to improve strength and mobility.  Demo, verbal and tactile cues  throughout for technique. Pulleys x 2 min flexion - reports stabbing pain in R shoulder Table slides flexion - better tolerated Windshield wipers on table - not getting ER, IR only Scapular punches 2 x 10  AAROM shoulder flexion with hands clasped Supine shoulder ER with cane supine Seated ER at table - tolerated well Manual Therapy: to decrease muscle spasm and pain and improve mobility Mobs to [facility] joint all direction, traction and ER to [facility] joint using short lever arm to decrease discomfort, STM/TPR to pectoralis, deltoids.    11/07/23  Therapeutic Exercise: to improve strength and mobility.  Demo, verbal and tactile cues throughout for technique.  R shoulder PROM flex, abd, ER gentle stretching Supine shoulder flexion with wand x 5 Supine chest press with wand x 10 Supine R ER with wand x 10 Supine R shoulder abduction with wand x 10 Seated R shoulder AA flexion with cane x 10 Pulleys review  11/05/23 Therapeutic Exercise: to improve strength and mobility.  Demo, verbal and tactile cues throughout for technique. AAROM flexion with green pball 2 x 5 Scap retraction x 15 Supine shoulder flexion AA with dowel 2x5 PROM to R shoulder very gentle all directions, very limited ROM esp ER Seated shoulder swifer flexion x 5; scaption x 5 Seated ER AA with dowel x 5- increased pain after Manual Therapy: to decrease muscle spasm, pain and improve mobility.  STM to R anterior, lateral, posterior deltoids     PATIENT EDUCATION: Education details: HEP  update Person educated: Patient Education method: Explanation, Demonstration, Verbal cues, and Handouts Education comprehension: verbalized understanding and returned demonstration  HOME EXERCISE PROGRAM: Access Code: [email or web address] URL: [email or web address] Date: 12/03/2023 Prepared by: Verta Ellen  Exercises - Seated Scapular Retraction  - 1 x daily - 7 x weekly - 1 sets - 5 reps - 5 sec hold - Supine Single Arm Shoulder  Protraction  - 1 x daily - 7 x weekly - 2 sets - 10 reps - Supine Shoulder Flexion AAROM with Hands Clasped  - 1 x daily - 7 x weekly - 2 sets - 10 reps - Seated Shoulder External Rotation PROM on Table  - 1 x daily - 7 x weekly - 1 sets - 5 reps - 15 sec hold - Standing Shoulder Row with Anchored Resistance  - 1 x daily - 7 x weekly - 2 sets - 10 reps - Shoulder extension with resistance - Neutral  - 1 x daily - 7 x weekly - 2 sets - 10 reps - Sidelying Hip Abduction  - 1 x daily - 7 x weekly - 3 sets - 10 reps - Shoulder Internal Rotation Reactive Isometrics  - 1 x daily - 3 x weekly - 2 sets - 10 reps - Shoulder external rotation  - 1 x daily - 3 x weekly - 2 sets - 10 reps - Standing Shoulder Flexion to 90 Degrees with Dumbbells  - 1 x daily - 3 x weekly - 2 sets - 10 reps  ASSESSMENT:  CLINICAL IMPRESSION: Progressed patient through strength and ROM interventions to work toward functional shoulder reaching and strength. Strength improved with all shoulder movements, able to hold against slight resistance. Able to complete all interventions, cues provided as needed for proper movement patterns and muscle activation. Lyn Henri would benefit from skilled therapy to improve Right shoulder ROM, decrease pain and improve functional mobility and ability to perform ADLs.   OBJECTIVE IMPAIRMENTS: decreased activity tolerance, decreased mobility, decreased ROM, decreased strength, hypomobility, increased fascial restrictions, increased muscle spasms, impaired flexibility, impaired sensation, impaired UE functional use, postural dysfunction, and pain.   ACTIVITY LIMITATIONS: carrying, lifting, bending, sleeping, bed mobility, bathing, dressing, reach over head, and hygiene/grooming  PARTICIPATION LIMITATIONS: meal prep, cleaning, laundry, driving, shopping, community activity, and yard work  PERSONAL FACTORS: Age, Past/current experiences, Time since onset of injury/illness/exacerbation, and 3+  comorbidities: Anxiety, depression, history of arthritis/cervical disk disease, CAD, COPD, dyspnea on exertion, prostate cancer, squamous cell cancer of tongue, history of stroke, R RTC repair, R carotid stenosis, back pain.   are also affecting patient's functional outcome.   REHAB POTENTIAL: Good  CLINICAL DECISION MAKING: Evolving/moderate complexity  EVALUATION COMPLEXITY: Moderate  GOALS: Goals reviewed with patient? Yes  SHORT TERM GOALS: Target date: 11/26/2023   Patient will be independent with initial HEP.  Baseline: given Goal status: MET 11/12/23 met  2.  Patient will reports pain < 3/10 with ROM Baseline: 7-8/10 Goal status: IN PROGRESS - 3/10 11/21/23  3.  Patient will demonstrate ROM to 130 deg forward elevation and 30 deg ER  Baseline: 60 deg ff, 10 deg ER Goal status: IN PROGRESS 11/12/23 120 flexion, 21 deg ER on 11/21/23   LONG TERM GOALS: Target date: 12/24/2023   Patient will be independent with advanced/ongoing HEP to improve outcomes and carryover.  Baseline:  Goal status: IN PROGRESS  2.  Patient will report 75% improvement in Right shoulder pain to improve QOL.  Baseline:  Goal status: IN PROGRESS  3.   Patient to improve Right shoulder AROM to [facility] without pain provocation to allow for increased ease of ADLs.  Baseline: see objective Goal status: IN PROGRESS  4.  Patient will demonstrate improved functional UE strength as demonstrated by 4+/5 R shoulder strength. Baseline: see objective Goal status: IN PROGRESS  5.  Patient will report 10 points improvement on QuickDash to demonstrate improved functional ability.  Baseline: 61.4% impairment Goal status: IN PROGRESS  6.  Patient will be able to be able to lay with arms overhead comfortably to undergo radiation treatment.    Baseline: unable Goal status: IN PROGRESS   PLAN:  PT FREQUENCY: 2x/week  PT DURATION: 8 weeks  PLANNED INTERVENTIONS: 97110-Therapeutic exercises, 97530- Therapeutic  activity, [id number]- Neuromuscular re-education, 97535- Self Care, [id number]- Manual therapy, 97014- Electrical stimulation (unattended), [id number]- Electrical stimulation (manual), 97016- Vasopneumatic device, [id number]- Ultrasound, [id number]- Ionotophoresis 4mg /ml Dexamethasone, Patient/Family education, Taping, Dry Needling, Joint mobilization, Joint manipulation, Spinal manipulation, Spinal mobilization, Cryotherapy, and Moist heat  PLAN FOR NEXT SESSION: gentle mobilization for ROM, progress per protocol and tolerance, DOS 09/06/2023, 11 weeks post op as of 11/22/2023   Darleene Cleaver, PTA 12/03/2023, 2:03 PM

## 2023-12-04 ENCOUNTER — Other Ambulatory Visit (HOSPITAL_BASED_OUTPATIENT_CLINIC_OR_DEPARTMENT_OTHER): Payer: Self-pay

## 2023-12-04 MED ORDER — DEXAMETHASONE 4 MG PO TABS
ORAL_TABLET | ORAL | 1 refills | Status: DC
  Filled 2023-12-04: qty 36, 18d supply, fill #0
  Filled 2024-03-08: qty 36, 18d supply, fill #1

## 2023-12-04 MED ORDER — PROCHLORPERAZINE MALEATE 10 MG PO TABS
ORAL_TABLET | ORAL | 3 refills | Status: DC
  Filled 2023-12-04: qty 30, 8d supply, fill #0
  Filled 2023-12-07: qty 30, 8d supply, fill #1

## 2023-12-04 MED ORDER — ONDANSETRON HCL 8 MG PO TABS
8.0000 mg | ORAL_TABLET | Freq: Three times a day (TID) | ORAL | 3 refills | Status: DC | PRN
  Filled 2023-12-04: qty 30, 10d supply, fill #0
  Filled 2024-05-01: qty 30, 10d supply, fill #1
  Filled 2024-06-29: qty 30, 10d supply, fill #2
  Filled 2024-06-30 – 2024-07-27 (×2): qty 30, 10d supply, fill #3
  Filled ????-??-??: fill #3

## 2023-12-05 ENCOUNTER — Ambulatory Visit: Payer: Medicare Other

## 2023-12-06 ENCOUNTER — Ambulatory Visit: Payer: Self-pay

## 2023-12-06 DIAGNOSIS — M25611 Stiffness of right shoulder, not elsewhere classified: Secondary | ICD-10-CM

## 2023-12-06 DIAGNOSIS — M6281 Muscle weakness (generalized): Secondary | ICD-10-CM

## 2023-12-06 DIAGNOSIS — M25511 Pain in right shoulder: Secondary | ICD-10-CM | POA: Diagnosis not present

## 2023-12-06 NOTE — Therapy (Signed)
 OUTPATIENT PHYSICAL THERAPY SHOULDER TREATMENT Progress Note Reporting Period 10/29/23 to 12/06/23  See note below for Objective Data and Assessment of Progress/Goals.      Patient Name: [name] MRN: [id number] DOB:[birth year], 73 y.o., male Today's Date: 12/06/2023  END OF SESSION:  PT End of Session - 12/06/23 1012     Visit Number 10    Date for PT Re-Evaluation 12/24/23    Authorization Type Medicare + [employer] Supplement    Progress Note Due on Visit 10    PT Start Time 0930    PT Stop Time 1012    PT Time Calculation (min) 42 min    Activity Tolerance Patient tolerated treatment well    Behavior During Therapy WFL for tasks assessed/performed                     Past Medical History:  Diagnosis Date   ADHD (attention deficit hyperactivity disorder)    Anxiety    Arthritis    "NECK, HANDS, KNEES" (06/14/2016)   Atypical mole 08/07/2021   Mid Back (Atypical Proliferation)   CAD (coronary artery disease)    Carotid artery stenosis    Complication of anesthesia    Reports that he had trouble swallowing post anesthesia- CABG   COPD (chronic obstructive pulmonary disease) (HCC) 12/11/2011   r/s mv - EF 72%; exercise capacity 13 METS; no exercised induced ischemic EKG changes   Depression, major    Dyspnea    with exertion   GERD (gastroesophageal reflux disease)    HTN (hypertension), benign 04/16/2008   echo - EF >55%; no regional wall or valvular abnormalities   Hyperlipidemia    statin intolerant   Hypothyroidism    Migraine    "crippling headaches as a kid; silent migraines now, no pain, couple times/week" (06/14/2016)   Peripheral vascular disease (HCC)    Prostate cancer (HCC) 04/2019   recent dx - Dr [name]   S/P angioplasty with stent 06/14/16 PCI & DES of high grade AV groove LCX and 1st OM  06/15/2016   Squamous cell cancer of tongue (HCC) ~ 2002   "[facility] radiation treatments at [facility]"   Stroke [facility])    hx Thalamic Ischemic CVA    Subclavian steal syndrome 06/17/2012   carotid doppler - R systolic brachial pressure , L ; R subclavian artery - proxmial obsstruction w/ abnormal monophasic waveforms, R ECA known occlusive disease; L ECA narrowing w/ 70-99% diameter reductiona   TIA (transient ischemic attack) 2005   Viral hepatitis 1970s   "non specific"   Past Surgical History:  Procedure Laterality Date   BACK SURGERY     BIOPSY  02/20/2018   Procedure: BIOPSY;  Surgeon: [name], MD;  Location: [facility];  Service: Endoscopy;;   BIOPSY TONGUE  ~ 2002   "base of tongue and uvula"   CARDIAC CATHETERIZATION  04/26/2008   L circumflex 99% stenosed midportion straddling a marginal branch; RCA total priximally w/ grade 2 L to R collaterals; renal arteries widely patent; LAD 30% segmental proximal stenosis   CARDIAC CATHETERIZATION N/A 05/14/2016   Procedure: Left Heart Cath and Cors/Grafts Angiography;  Surgeon: [name], MD;  Location: [facility];  Service: Cardiovascular;  Laterality: N/A;   CARDIAC CATHETERIZATION N/A 06/14/2016   Procedure: Coronary Stent Intervention;  Surgeon: [name], MD;  Location: [facility];  Service: Cardiovascular;  Laterality: N/A;   CERVICAL DISC SURGERY  2000   Kritzer  Posterior approach   COLONOSCOPY WITH PROPOFOL N/A 02/20/2018   Procedure: COLONOSCOPY WITH PROPOFOL;  Surgeon: [name], MD;  Location: [facility];  Service: Endoscopy;  Laterality: N/A;   CORONARY ANGIOPLASTY WITH STENT PLACEMENT     CORONARY ARTERY BYPASS GRAFT  2009   x5; LIMA to LAd, sequential vein to first diagonal branch, first and second obtuse marginal branches; vein to PDA   ESOPHAGOGASTRODUODENOSCOPY (EGD) WITH PROPOFOL N/A 02/20/2018   Procedure: ESOPHAGOGASTRODUODENOSCOPY (EGD) WITH PROPOFOL;  Surgeon: [name], MD;  Location: [facility];  Service: Endoscopy;  Laterality: N/A;   INSERTION OF MESH N/A 10/29/2017   Procedure: INSERTION OF MESH;   Surgeon: [name], MD;  Location: [facility] OR;  Service: General;  Laterality: N/A;   LUMBAR DISC SURGERY  06/17/2019   LUMBAR 2 & 3    LUMBAR LAMINECTOMY/DECOMPRESSION MICRODISCECTOMY N/A 06/17/2019   Procedure: Lumbar two -Lumbar three decompression and disectomy;  Surgeon: [name], MD;  Location: [facility] OR;  Service: Orthopedics;  Laterality: N/A;  3 hrs   POLYPECTOMY  02/20/2018   Procedure: POLYPECTOMY;  Surgeon: [name], MD;  Location: [facility];  Service: Endoscopy;;   REVERSE SHOULDER ARTHROPLASTY Right 09/06/2023   Procedure: REVERSE SHOULDER ARTHROPLASTY;  Surgeon: [name], MD;  Location: [facility];  Service: Orthopedics;  Laterality: Right;  interscalene  100   SHOULDER ARTHROSCOPY WITH SUBACROMIAL DECOMPRESSION AND OPEN ROTATOR C Left 09/28/2013   Procedure: LEFT SHOULDER ARTHROSCOPY WITH SUBACROMIAL DECOMPRESSION AND MINI OPEN ROTATOR CUFF REPAIR, OPEN DISTAL CLAVICLE RESECTION AND OPEN BICEP TENDODESIS ;  Surgeon: [name], MD;  Location: [facility] OR;  Service: Orthopedics;  Laterality: Left;   TRANSCAROTID ARTERY REVASCULARIZATION  Right 05/05/2021   Procedure: RIGHT TRANSCAROTID ARTERY REVASCULARIZATION;  Surgeon: [name], MD;  Location: [facility];  Service: Vascular;  Laterality: Right;   UMBILICAL HERNIA REPAIR N/A 10/29/2017   Procedure: UMBILICAL HERNIA REPAIR;  Surgeon: [name], MD;  Location: [facility] OR;  Service: General;  Laterality: N/A;   Patient Active Problem List   Diagnosis Date Noted   Carotid stenosis, right 05/05/2021   Former smoker 03/24/2020   SOB (shortness of breath) 03/24/2020   Lumbar disc herniation 06/17/2019   Right lumbar radiculopathy 05/16/2019   Elevated PSA 05/16/2019   S/P angioplasty with stent 06/14/16 PCI & DES of high grade AV groove LCX and 1st OM  06/15/2016   CAD (coronary artery disease) 06/14/2016   Abnormal stress test    Carotid artery disease (HCC) 05/20/2013   Hypothyroidism 03/29/2012   Erectile  dysfunction 07/02/2011   Anxiety 07/02/2011   Back pain 07/02/2011   Cervical disc disease 07/02/2011   Coronary artery disease 04/29/2011   Squamous cell carcinoma of tongue (HCC) 04/29/2011   Hypertension 04/29/2011   Depression 04/29/2011   GE reflux 04/29/2011   Internal carotid artery stenosis 04/29/2011   Ptosis of eyelid, left 04/29/2011   Hyperlipidemia 04/29/2011   History of ischemic vertebrobasilar artery thalamic stroke 04/29/2011   Attention deficit disorder 04/29/2011   Degenerative joint disease of cervical spine 04/29/2011    PCP: Margaree Mackintosh, MD    REFERRING PROVIDER: [name], MD  REFERRING DIAG:  Z47.89 (ICD-10-CM) - Orthopedic aftercare  [phone] (ICD-10-CM) - S/p reverse total shoulder arthroplasty    THERAPY DIAG:  Acute pain of right shoulder  Stiffness of right shoulder, not elsewhere classified  Muscle weakness (generalized)  Rationale for Evaluation and Treatment: Rehabilitation  ONSET DATE: DOS R shoulder rTSA 09/06/2023  SUBJECTIVE:  SUBJECTIVE STATEMENT: Accompanied by wife today. Pt reports Sunday he could barely move his arm, yesterday not as bad. He points to pain in his R bicep Hand dominance: Left  NEXT MD VISIT:  December 04, 2023  PERTINENT HISTORY: Anxiety, depression, history of arthritis/cervical disk disease, CAD, COPD, dyspnea on exertion, prostate cancer, squamous cell cancer of tongue, history of stroke, R RTC repair, R carotid stenosis, back pain.   PAIN:  Are you having pain? Yes: NPRS scale: 5/10 Pain location: R anterior shoulder and bicep Pain description: sharp, achey Aggravating factors: moving wrong, sleeping on it it. Relieving factors: Percocet, muscle relaxers  PRECAUTIONS: None  RED FLAGS: None   WEIGHT BEARING RESTRICTIONS:  No  FALLS:  Has patient fallen in last 6 months?  Occasionally tripping over the dogs.   LIVING ENVIRONMENT: Lives with: lives with their spouse Lives in: House/apartment Stairs: Yes: Internal: 14 steps; on left going up and External: 2 steps; on left going up Has following equipment at home: None  OCCUPATION: retired  PLOF: Independent and Leisure: woodworking  PATIENT GOALS: getting back to doing stuff and not being miserable.  Also has a couple of nodules in lungs, needs to be able to have arms up.   OBJECTIVE:   DIAGNOSTIC FINDINGS:  10/22/2023 Imaging: AP and scapular Y views of the right shoulder show a well-fixed and well-positioned right reverse TSA. No periprosthetic fractures or acromial fractures noted.    PATIENT SURVEYS:  Quick Dash 61.4%  COGNITION: Overall cognitive status: Within functional limits for tasks assessed     SENSATION: Reports pins/needles at night in R hand  POSTURE: Forward head, rounded shoulders.   UPPER EXTREMITY ROM:   Active ROM Right eval Left eval R 11/07/23 R 11/12/23 R 11/21/23 R 12/06/23  Shoulder flexion 60 135 100 120 108 120  Shoulder extension 35 45 35     Shoulder abduction 45 85 94 90 100 110  Shoulder adduction        Shoulder internal rotation 50 F- to ear 65 F- back of head  FIR- to glutes   FIR to L5  Shoulder external rotation 10 F-to hip 70 F-small of back FER- to UT muscle 10 21 FER- to C4-5  (Blank rows = not tested)  UPPER EXTREMITY MMT:  MMT Right eval Left eval R 12/03/23  Shoulder flexion 2 5 3+  Shoulder extension 2    Shoulder abduction 2 5 3+  Shoulder internal rotation 2 5 3+  Shoulder external rotation 2 5 3+  Elbow flexion 4 5 5   Elbow extension 4p! 5 5  Wrist flexion     Wrist extension     Grip strength (lbs)     (Blank rows = not tested)  JOINT MOBILITY TESTING:  Decreased mobility all directions in [facility] joint  PALPATION:   Tenderness posterior R shoulder.   TODAY'S TREATMENT:  DATE: 12/06/23 Therapeutic Exercise: and activity Pulley 2 min flexion Rows RTB 2x10 Shld ext RTB 2x10 Standing shoulder flexion 1lb 2x5- starts to pinch along middle deltoids Standing shoulder abduction x 10 Finger ladder flexion x 10 PROM end range stretching for ER Assessment of goals for 10th visit PN  12/03/23 Therapeutic Exercise: to improve strength and mobility.  Demo, verbal and tactile cues throughout for technique.  Pulleys: 1 min flexion, 1 min scaption Supine shoulder flexion with 1lb x 10 Serratus punch 1lb x 10 MMT Wall slide flexion 1lb cuff weight 2x5 Isometric step out IR YTB x 10; ER YTB x 10 Rows RTB x 10- cues to avoid bicep curling Shoulder ext RTB x x 10  11/28/23 Shoulder AAROM wall slide into flexion x 10 AROM wall slide flexion 2x5 Standing ER with wand 2 x 10  Isometric ER YTB -p! Isometric IR YTB x 5x5"  STM to middle and posterior deltoids Shoulder mobilization into end range ER   11/21/23 Shoulder AAROM wall slide into flexion x 10 Finger ladder x 5 flexion Standing rows YTB 2x10 Standing shoulder ext YTB 2x10 Up/down and side to side stabilization with orange pball x 10 PROM stretching for R shoulder all directions; PNF contract/relax for ER Measured shoulder ROM 11/19/23 Therapeutic Activity: to improve functional performance. Shoulder AAROM wall slide into flexion x 10 Standing rows YTB x 10 Standing shoulder ext YTB x 10 Standing shoulder AAROM flexion with wand x 10 Shoulder abduction AAROM with wand x 10 Standing ER with wand x 10 AAROM Manual Therapy: to decrease muscle spasm and pain and improve mobility STM to R UT, middle and pot deltoids Passive stretching for ER flexion and abd 11/14/23 Therapeutic Exercise: to improve strength and mobility.  Demo, verbal and tactile cues throughout for  technique. Supine R shoulder ER  with wand x 10 Supine chest press with wand x 12 Seated rows YTB x 10 Seated shoulder extension YTB x 10- cues for posture Seated AAROM prayer raise x 10  Seated abduction with wand x 10  Manual Therapy: to decrease muscle spasm and pain and improve mobility Mobs to [facility] joint all direction PROM using short lever arm to decrease discomfort  11/12/23 Therapeutic Exercise: to improve strength and mobility.  Demo, verbal and tactile cues throughout for technique. Pulleys x 2 min flexion - reports stabbing pain in R shoulder Table slides flexion - better tolerated Windshield wipers on table - not getting ER, IR only Scapular punches 2 x 10  AAROM shoulder flexion with hands clasped Supine shoulder ER with cane supine Seated ER at table - tolerated well Manual Therapy: to decrease muscle spasm and pain and improve mobility Mobs to [facility] joint all direction, traction and ER to [facility] joint using short lever arm to decrease discomfort, STM/TPR to pectoralis, deltoids.    11/07/23  Therapeutic Exercise: to improve strength and mobility.  Demo, verbal and tactile cues throughout for technique.  R shoulder PROM flex, abd, ER gentle stretching Supine shoulder flexion with wand x 5 Supine chest press with wand x 10 Supine R ER with wand x 10 Supine R shoulder abduction with wand x 10 Seated R shoulder AA flexion with cane x 10 Pulleys review  11/05/23 Therapeutic Exercise: to improve strength and mobility.  Demo, verbal and tactile cues throughout for technique. AAROM flexion with green pball 2 x 5 Scap retraction x 15 Supine shoulder flexion AA with dowel 2x5 PROM to R shoulder very gentle all directions, very  limited ROM esp ER Seated shoulder swifer flexion x 5; scaption x 5 Seated ER AA with dowel x 5- increased pain after Manual Therapy: to decrease muscle spasm, pain and improve mobility.  STM to R anterior, lateral, posterior deltoids     PATIENT  EDUCATION: Education details: HEP update Person educated: Patient Education method: Explanation, Demonstration, Verbal cues, and Handouts Education comprehension: verbalized understanding and returned demonstration  HOME EXERCISE PROGRAM: Access Code: [email or web address] URL: [email or web address] Date: 12/03/2023 Prepared by: Verta Ellen  Exercises - Seated Scapular Retraction  - 1 x daily - 7 x weekly - 1 sets - 5 reps - 5 sec hold - Supine Single Arm Shoulder Protraction  - 1 x daily - 7 x weekly - 2 sets - 10 reps - Supine Shoulder Flexion AAROM with Hands Clasped  - 1 x daily - 7 x weekly - 2 sets - 10 reps - Seated Shoulder External Rotation PROM on Table  - 1 x daily - 7 x weekly - 1 sets - 5 reps - 15 sec hold - Standing Shoulder Row with Anchored Resistance  - 1 x daily - 7 x weekly - 2 sets - 10 reps - Shoulder extension with resistance - Neutral  - 1 x daily - 7 x weekly - 2 sets - 10 reps - Sidelying Hip Abduction  - 1 x daily - 7 x weekly - 3 sets - 10 reps - Shoulder Internal Rotation Reactive Isometrics  - 1 x daily - 3 x weekly - 2 sets - 10 reps - Shoulder external rotation  - 1 x daily - 3 x weekly - 2 sets - 10 reps - Standing Shoulder Flexion to 90 Degrees with Dumbbells  - 1 x daily - 3 x weekly - 2 sets - 10 reps  ASSESSMENT:  CLINICAL IMPRESSION: Pt is showing progress with UE strength and AROM. He is now able to reach overhead with no pain (STG #2 met) initially but his shoulder gets tired pretty fast. He notes 75% improvement in his overall R UE pain so has met LTG #2. Continued working on strengthening for deltoids and postural muscles. Cues and instruction given as needed to correct form. Pt is showing great progress toward goals, and would continue to benefit from skilled therapy to continue improving R shoulder function.   OBJECTIVE IMPAIRMENTS: decreased activity tolerance, decreased mobility, decreased ROM, decreased strength, hypomobility, increased  fascial restrictions, increased muscle spasms, impaired flexibility, impaired sensation, impaired UE functional use, postural dysfunction, and pain.   ACTIVITY LIMITATIONS: carrying, lifting, bending, sleeping, bed mobility, bathing, dressing, reach over head, and hygiene/grooming  PARTICIPATION LIMITATIONS: meal prep, cleaning, laundry, driving, shopping, community activity, and yard work  PERSONAL FACTORS: Age, Past/current experiences, Time since onset of injury/illness/exacerbation, and 3+ comorbidities: Anxiety, depression, history of arthritis/cervical disk disease, CAD, COPD, dyspnea on exertion, prostate cancer, squamous cell cancer of tongue, history of stroke, R RTC repair, R carotid stenosis, back pain.   are also affecting patient's functional outcome.   REHAB POTENTIAL: Good  CLINICAL DECISION MAKING: Evolving/moderate complexity  EVALUATION COMPLEXITY: Moderate  GOALS: Goals reviewed with patient? Yes  SHORT TERM GOALS: Target date: 11/26/2023   Patient will be independent with initial HEP.  Baseline: given Goal status: MET 11/12/23 met  2.  Patient will reports pain < 3/10 with ROM Baseline: 7-8/10 Goal status: MET - 12/06/23 no pain  3.  Patient will demonstrate ROM to 130 deg forward elevation and 30 deg ER  Baseline: 60 deg ff,  10 deg ER Goal status: IN PROGRESS 11/12/23 120 flexion, 21 deg ER on 11/21/23   LONG TERM GOALS: Target date: 12/24/2023   Patient will be independent with advanced/ongoing HEP to improve outcomes and carryover.  Baseline:  Goal status: IN PROGRESS  2.  Patient will report 75% improvement in Right shoulder pain to improve QOL.  Baseline:  Goal status: MET- 12/06/23  3.   Patient to improve Right shoulder AROM to [facility] without pain provocation to allow for increased ease of ADLs.  Baseline: see objective Goal status: IN PROGRESS- 12/06/23 no pain just tight  4.  Patient will demonstrate improved functional UE strength as demonstrated by 4+/5  R shoulder strength. Baseline: see objective Goal status: IN PROGRESS- 12/03/23 see chart  5.  Patient will report 10 points improvement on QuickDash to demonstrate improved functional ability.  Baseline: 61.4% impairment Goal status: IN PROGRESS  6.  Patient will be able to be able to lay with arms overhead comfortably to undergo radiation treatment.    Baseline: unable Goal status: IN PROGRESS - 12/06/23 able to do with no pain  PLAN:  PT FREQUENCY: 2x/week  PT DURATION: 8 weeks  PLANNED INTERVENTIONS: 97110-Therapeutic exercises, 97530- Therapeutic activity, 97112- Neuromuscular re-education, 97535- Self Care, 44010- Manual therapy, 97014- Electrical stimulation (unattended), [id number]- Electrical stimulation (manual), 97016- Vasopneumatic device, [id number]- Ultrasound, [id number]- Ionotophoresis 4mg /ml Dexamethasone, Patient/Family education, Taping, Dry Needling, Joint mobilization, Joint manipulation, Spinal manipulation, Spinal mobilization, Cryotherapy, and Moist heat  PLAN FOR NEXT SESSION: gentle mobilization for ROM, progress per protocol and tolerance, DOS 09/06/2023, 11 weeks post op as of 11/22/2023   Darleene Cleaver, PTA 12/06/2023, 10:13 AM

## 2023-12-07 ENCOUNTER — Other Ambulatory Visit (HOSPITAL_BASED_OUTPATIENT_CLINIC_OR_DEPARTMENT_OTHER): Payer: Self-pay

## 2023-12-07 ENCOUNTER — Other Ambulatory Visit: Payer: Self-pay | Admitting: Cardiovascular Disease

## 2023-12-07 ENCOUNTER — Other Ambulatory Visit: Payer: Self-pay

## 2023-12-07 ENCOUNTER — Other Ambulatory Visit: Payer: Self-pay | Admitting: Internal Medicine

## 2023-12-08 ENCOUNTER — Other Ambulatory Visit: Payer: Self-pay

## 2023-12-09 ENCOUNTER — Other Ambulatory Visit (HOSPITAL_BASED_OUTPATIENT_CLINIC_OR_DEPARTMENT_OTHER): Payer: Self-pay

## 2023-12-09 ENCOUNTER — Other Ambulatory Visit: Payer: Self-pay

## 2023-12-09 MED ORDER — NYSTATIN 100000 UNIT/ML MT SUSP
5.0000 mL | Freq: Four times a day (QID) | OROMUCOSAL | 0 refills | Status: DC
  Filled 2023-12-09: qty 200, 10d supply, fill #0

## 2023-12-09 MED ORDER — REPATHA SURECLICK 140 MG/ML ~~LOC~~ SOAJ
140.0000 mg | SUBCUTANEOUS | 3 refills | Status: AC
  Filled 2023-12-09: qty 6, 84d supply, fill #0
  Filled 2024-03-08 – 2024-03-09 (×2): qty 6, 84d supply, fill #1
  Filled 2024-05-28: qty 6, 84d supply, fill #2
  Filled 2024-09-06: qty 6, 84d supply, fill #3

## 2023-12-09 MED ORDER — LEVOTHYROXINE SODIUM 100 MCG PO TABS
100.0000 ug | ORAL_TABLET | Freq: Every morning | ORAL | 1 refills | Status: DC
  Filled 2023-12-09: qty 90, 90d supply, fill #0

## 2023-12-10 ENCOUNTER — Ambulatory Visit: Payer: Medicare Other

## 2023-12-10 DIAGNOSIS — M25511 Pain in right shoulder: Secondary | ICD-10-CM | POA: Diagnosis not present

## 2023-12-10 DIAGNOSIS — M6281 Muscle weakness (generalized): Secondary | ICD-10-CM

## 2023-12-10 DIAGNOSIS — M25611 Stiffness of right shoulder, not elsewhere classified: Secondary | ICD-10-CM

## 2023-12-10 NOTE — Therapy (Signed)
 OUTPATIENT PHYSICAL THERAPY SHOULDER TREATMENT     Patient Name: [name] MRN: [id number] DOB:[birth year], 73 y.o., male Today's Date: 12/10/2023  END OF SESSION:  PT End of Session - 12/10/23 1321     Visit Number 11    Date for PT Re-Evaluation 12/24/23    Authorization Type Medicare + [employer] Supplement    Progress Note Due on Visit 10    PT Start Time 1150    PT Stop Time 1230    PT Time Calculation (min) 40 min    Activity Tolerance Patient tolerated treatment well    Behavior During Therapy WFL for tasks assessed/performed                      Past Medical History:  Diagnosis Date   ADHD (attention deficit hyperactivity disorder)    Anxiety    Arthritis    "NECK, HANDS, KNEES" (06/14/2016)   Atypical mole 08/07/2021   Mid Back (Atypical Proliferation)   CAD (coronary artery disease)    Carotid artery stenosis    Complication of anesthesia    Reports that he had trouble swallowing post anesthesia- CABG   COPD (chronic obstructive pulmonary disease) (HCC) 12/11/2011   r/s mv - EF 72%; exercise capacity 13 METS; no exercised induced ischemic EKG changes   Depression, major    Dyspnea    with exertion   GERD (gastroesophageal reflux disease)    HTN (hypertension), benign 04/16/2008   echo - EF >55%; no regional wall or valvular abnormalities   Hyperlipidemia    statin intolerant   Hypothyroidism    Migraine    "crippling headaches as a kid; silent migraines now, no pain, couple times/week" (06/14/2016)   Peripheral vascular disease (HCC)    Prostate cancer (HCC) 04/2019   recent dx - Dr [name]   S/P angioplasty with stent 06/14/16 PCI & DES of high grade AV groove LCX and 1st OM  06/15/2016   Squamous cell cancer of tongue (HCC) ~ 2002   "35 radiation treatments at [facility]"   Stroke [facility])    hx Thalamic Ischemic CVA   Subclavian steal syndrome 06/17/2012   carotid doppler - R systolic brachial pressure , L ; R subclavian  artery - proxmial obsstruction w/ abnormal monophasic waveforms, R ECA known occlusive disease; L ECA narrowing w/ 70-99% diameter reductiona   TIA (transient ischemic attack) 2005   Viral hepatitis 1970s   "non specific"   Past Surgical History:  Procedure Laterality Date   BACK SURGERY     BIOPSY  02/20/2018   Procedure: BIOPSY;  Surgeon: [name], MD;  Location: [facility];  Service: Endoscopy;;   BIOPSY TONGUE  ~ 2002   "base of tongue and uvula"   CARDIAC CATHETERIZATION  04/26/2008   L circumflex 99% stenosed midportion straddling a marginal branch; RCA total priximally w/ grade 2 L to R collaterals; renal arteries widely patent; LAD 30% segmental proximal stenosis   CARDIAC CATHETERIZATION N/A 05/14/2016   Procedure: Left Heart Cath and Cors/Grafts Angiography;  Surgeon: [name], MD;  Location: [facility] INVAS[facility] CV LAB;  Service: Cardiovascular;  Laterality: N/A;   CARDIAC CATHETERIZATION N/A 06/14/2016   Procedure: Coronary Stent Intervention;  Surgeon: [name], MD;  Location: [facility] INVAS[facility] CV LAB;  Service: Cardiovascular;  Laterality: N/A;   CERVICAL DISC SURGERY  2000   Kritzer    Posterior approach   COLONOSCOPY WITH PROPOFOL N/A 02/20/2018   Procedure: COLONOSCOPY WITH PROPOFOL;  Surgeon: [name], MD;  Location: [facility];  Service: Endoscopy;  Laterality: N/A;   CORONARY ANGIOPLASTY WITH STENT PLACEMENT     CORONARY ARTERY BYPASS GRAFT  2009   x5; LIMA to LAd, sequential vein to first diagonal branch, first and second obtuse marginal branches; vein to PDA   ESOPHAGOGASTRODUODENOSCOPY (EGD) WITH PROPOFOL N/A 02/20/2018   Procedure: ESOPHAGOGASTRODUODENOSCOPY (EGD) WITH PROPOFOL;  Surgeon: [name], MD;  Location: [facility];  Service: Endoscopy;  Laterality: N/A;   INSERTION OF MESH N/A 10/29/2017   Procedure: INSERTION OF MESH;  Surgeon: [name], MD;  Location: [facility] OR;  Service: General;  Laterality: N/A;   LUMBAR DISC SURGERY  06/17/2019    LUMBAR 2 & 3    LUMBAR LAMINECTOMY/DECOMPRESSION MICRODISCECTOMY N/A 06/17/2019   Procedure: Lumbar two -Lumbar three decompression and disectomy;  Surgeon: [name], MD;  [facility]: [facility] OR;  Service: Orthopedics;  Laterality: N/A;  3 hrs   POLYPECTOMY  02/20/2018   Procedure: POLYPECTOMY;  Surgeon: [name], MD;  Location: [facility];  Service: Endoscopy;;   REVERSE SHOULDER ARTHROPLASTY Right 09/06/2023   Procedure: REVERSE SHOULDER ARTHROPLASTY;  Surgeon: [name], MD;  Location: [facility] OR[facility];  Service: Orthopedics;  Laterality: Right;  interscalene  100   SHOULDER ARTHROSCOPY WITH SUBACROMIAL DECOMPRESSION AND OPEN ROTATOR C Left 09/28/2013   Procedure: LEFT SHOULDER ARTHROSCOPY WITH SUBACROMIAL DECOMPRESSION AND MINI OPEN ROTATOR CUFF REPAIR, OPEN DISTAL CLAVICLE RESECTION AND OPEN BICEP TENDODESIS ;  Surgeon: [name], MD;  Location: [facility] OR;  Service: Orthopedics;  Laterality: Left;   TRANSCAROTID ARTERY REVASCULARIZATION  Right 05/05/2021   Procedure: RIGHT TRANSCAROTID ARTERY REVASCULARIZATION;  Surgeon: [name], MD;  Location: [facility] OR;  Service: Vascular;  Laterality: Right;   UMBILICAL HERNIA REPAIR N/A 10/29/2017   Procedure: UMBILICAL HERNIA REPAIR;  Surgeon: [name], MD;  Location[facility] OR;  Service: General;  Laterality: N/A;   Patient Active Problem List   Diagnosis Date Noted   Carotid stenosis, right 05/05/2021   Former smoker 03/24/2020   SOB (shortness of breath) 03/24/2020   Lumbar disc herniation 06/17/2019   Right lumbar radiculopathy 05/16/2019   Elevated PSA 05/16/2019   S/P angioplasty with stent 06/14/16 PCI & DES of high grade AV groove LCX and 1st OM  06/15/2016   CAD (coronary artery disease) 06/14/2016   Abnormal stress test    Carotid artery disease (HCC) 05/20/2013   Hypothyroidism 03/29/2012   Erectile dysfunction 07/02/2011   Anxiety 07/02/2011   Back pain 07/02/2011   Cervical disc disease 07/02/2011   Coronary artery  disease 04/29/2011   Squamous cell carcinoma of tongue (HCC) 04/29/2011   Hypertension 04/29/2011   Depression 04/29/2011   GE reflux 04/29/2011   Internal carotid artery stenosis 04/29/2011   Ptosis of eyelid, left 04/29/2011   Hyperlipidemia 04/29/2011   History of ischemic vertebrobasilar artery thalamic stroke 04/29/2011   Attention deficit disorder 04/29/2011   Degenerative joint disease of cervical spine 04/29/2011    PCP: Margaree Mackintosh, MD    REFERRING PROVIDER: [name], MD  REFERRING DIAG:  Z47.89 (ICD-10-CM) - Orthopedic aftercare  [phone] (ICD-10-CM) - S/p reverse total shoulder arthroplasty    THERAPY DIAG:  Acute pain of right shoulder  Stiffness of right shoulder, not elsewhere classified  Muscle weakness (generalized)  Rationale for Evaluation and Treatment: Rehabilitation  ONSET DATE: DOS R shoulder rTSA 09/06/2023  SUBJECTIVE:  SUBJECTIVE STATEMENT: Accompanied by wife today. Pt reports he was able to drive to [place]. Hand dominance: Left  NEXT MD VISIT:  12/24/23  PERTINENT HISTORY: Anxiety, depression, history of arthritis/cervical disk disease, CAD, COPD, dyspnea on exertion, prostate cancer, squamous cell cancer of tongue, history of stroke, R RTC repair, R carotid stenosis, back pain.   PAIN:  Are you having pain? Yes: NPRS scale: 5/10 Pain location: R anterior shoulder and bicep Pain description: sharp, achey Aggravating factors: moving wrong, sleeping on it it. Relieving factors: Percocet, muscle relaxers  PRECAUTIONS: None  RED FLAGS: None   WEIGHT BEARING RESTRICTIONS: No  FALLS:  Has patient fallen in last 6 months?  Occasionally tripping over the dogs.   LIVING ENVIRONMENT: Lives with: lives with their spouse Lives in: House/apartment Stairs:  Yes: Internal: 14 steps; on left going up and External: 2 steps; on left going up Has following equipment at home: None  OCCUPATION: retired  PLOF: Independent and Leisure: woodworking  PATIENT GOALS: getting back to doing stuff and not being miserable.  Also has a couple of nodules in lungs, needs to be able to have arms up.   OBJECTIVE:   DIAGNOSTIC FINDINGS:  10/22/2023 Imaging: AP and scapular Y views of the right shoulder show a well-fixed and well-positioned right reverse TSA. No periprosthetic fractures or acromial fractures noted.    PATIENT SURVEYS:  Quick Dash 61.4%  COGNITION: Overall cognitive status: Within functional limits for tasks assessed     SENSATION: Reports pins/needles at night in R hand  POSTURE: Forward head, rounded shoulders.   UPPER EXTREMITY ROM:   Active ROM Right eval Left eval R 11/07/23 R 11/12/23 R 11/21/23 R 12/06/23  Shoulder flexion 60 135 100 120 108 120  Shoulder extension 35 45 35     Shoulder abduction 45 85 94 90 100 110  Shoulder adduction        Shoulder internal rotation 50 F- to ear 65 F- back of head  FIR- to glutes   FIR to L5  Shoulder external rotation 10 F-to hip 70 F-small of back FER- to UT muscle 10 21 FER- to C4-5  (Blank rows = not tested)  UPPER EXTREMITY MMT:  MMT Right eval Left eval R 12/03/23  Shoulder flexion 2 5 3+  Shoulder extension 2    Shoulder abduction 2 5 3+  Shoulder internal rotation 2 5 3+  Shoulder external rotation 2 5 3+  Elbow flexion 4 5 5   Elbow extension 4p! 5 5  Wrist flexion     Wrist extension     Grip strength (lbs)     (Blank rows = not tested)  JOINT MOBILITY TESTING:  Decreased mobility all directions in [facility] joint  PALPATION:   Tenderness posterior R shoulder.   TODAY'S TREATMENT:  DATE: 12/10/23 Shld ext RTB 2 x12 Mid rows RTB  2x12 IR iso step out RTB 10x ER iso step out YTB 10x STM to middle and anterior deltoids ER passive stretch QuickDASH Score: 31.8 / 100 = 31.8 % 12/06/23 Therapeutic Exercise: and activity Pulley 2 min flexion Rows RTB 2x10 Shld ext RTB 2x10 Standing shoulder flexion 1lb 2x5- starts to pinch along middle deltoids Standing shoulder abduction x 10 Finger ladder flexion x 10 PROM end range stretching for ER Assessment of goals for 10th visit PN  12/03/23 Therapeutic Exercise: to improve strength and mobility.  Demo, verbal and tactile cues throughout for technique.  Pulleys: 1 min flexion, 1 min scaption Supine shoulder flexion with 1lb x 10 Serratus punch 1lb x 10 MMT Wall slide flexion 1lb cuff weight 2x5 Isometric step out IR YTB x 10; ER YTB x 10 Rows RTB x 10- cues to avoid bicep curling Shoulder ext RTB x x 10  11/28/23 Shoulder AAROM wall slide into flexion x 10 AROM wall slide flexion 2x5 Standing ER with wand 2 x 10  Isometric ER YTB -p! Isometric IR YTB x 5x5"  STM to middle and posterior deltoids Shoulder mobilization into end range ER   11/21/23 Shoulder AAROM wall slide into flexion x 10 Finger ladder x 5 flexion Standing rows YTB 2x10 Standing shoulder ext YTB 2x10 Up/down and side to side stabilization with orange pball x 10 PROM stretching for R shoulder all directions; PNF contract/relax for ER Measured shoulder ROM 11/19/23 Therapeutic Activity: to improve functional performance. Shoulder AAROM wall slide into flexion x 10 Standing rows YTB x 10 Standing shoulder ext YTB x 10 Standing shoulder AAROM flexion with wand x 10 Shoulder abduction AAROM with wand x 10 Standing ER with wand x 10 AAROM Manual Therapy: to decrease muscle spasm and pain and improve mobility STM to R UT, middle and pot deltoids Passive stretching for ER flexion and abd 11/14/23 Therapeutic Exercise: to improve strength and mobility.  Demo, verbal and tactile cues throughout for  technique. Supine R shoulder ER  with wand x 10 Supine chest press with wand x 12 Seated rows YTB x 10 Seated shoulder extension YTB x 10- cues for posture Seated AAROM prayer raise x 10  Seated abduction with wand x 10  Manual Therapy: to decrease muscle spasm and pain and improve mobility Mobs to [facility] joint all direction PROM using short lever arm to decrease discomfort  11/12/23 Therapeutic Exercise: to improve strength and mobility.  Demo, verbal and tactile cues throughout for technique. Pulleys x 2 min flexion - reports stabbing pain in R shoulder Table slides flexion - better tolerated Windshield wipers on table - not getting ER, IR only Scapular punches 2 x 10  AAROM shoulder flexion with hands clasped Supine shoulder ER with cane supine Seated ER at table - tolerated well Manual Therapy: to decrease muscle spasm and pain and improve mobility Mobs to [facility] joint all direction, traction and ER to [facility] joint using short lever arm to decrease discomfort, STM/TPR to pectoralis, deltoids.    11/07/23  Therapeutic Exercise: to improve strength and mobility.  Demo, verbal and tactile cues throughout for technique.  R shoulder PROM flex, abd, ER gentle stretching Supine shoulder flexion with wand x 5 Supine chest press with wand x 10 Supine R ER with wand x 10 Supine R shoulder abduction with wand x 10 Seated R shoulder AA flexion with cane x 10 Pulleys review  11/05/23 Therapeutic Exercise: to improve  strength and mobility.  Demo, verbal and tactile cues throughout for technique. AAROM flexion with green pball 2 x 5 Scap retraction x 15 Supine shoulder flexion AA with dowel 2x5 PROM to R shoulder very gentle all directions, very limited ROM esp ER Seated shoulder swifer flexion x 5; scaption x 5 Seated ER AA with dowel x 5- increased pain after Manual Therapy: to decrease muscle spasm, pain and improve mobility.  STM to R anterior, lateral, posterior deltoids     PATIENT  EDUCATION: Education details: HEP update Person educated: Patient Education method: Explanation, Demonstration, Verbal cues, and Handouts Education comprehension: verbalized understanding and returned demonstration  HOME EXERCISE PROGRAM: Access Code: [email or web address] URL: [email or web address] Date: 12/03/2023 Prepared by: Verta Ellen  Exercises - Seated Scapular Retraction  - 1 x daily - 7 x weekly - 1 sets - 5 reps - 5 sec hold - Supine Single Arm Shoulder Protraction  - 1 x daily - 7 x weekly - 2 sets - 10 reps - Supine Shoulder Flexion AAROM with Hands Clasped  - 1 x daily - 7 x weekly - 2 sets - 10 reps - Seated Shoulder External Rotation PROM on Table  - 1 x daily - 7 x weekly - 1 sets - 5 reps - 15 sec hold - Standing Shoulder Row with Anchored Resistance  - 1 x daily - 7 x weekly - 2 sets - 10 reps - Shoulder extension with resistance - Neutral  - 1 x daily - 7 x weekly - 2 sets - 10 reps - Sidelying Hip Abduction  - 1 x daily - 7 x weekly - 3 sets - 10 reps - Shoulder Internal Rotation Reactive Isometrics  - 1 x daily - 3 x weekly - 2 sets - 10 reps - Shoulder external rotation  - 1 x daily - 3 x weekly - 2 sets - 10 reps - Standing Shoulder Flexion to 90 Degrees with Dumbbells  - 1 x daily - 3 x weekly - 2 sets - 10 reps  ASSESSMENT:  CLINICAL IMPRESSION: Continued progressing exercises to tolerance. Cues to engage shoulder blades with postural strengthening. He continues to note lateral shoulder soreness with general movement. Functional ROM looks good with strength improving well. LTG met for Neldon Mc today.   OBJECTIVE IMPAIRMENTS: decreased activity tolerance, decreased mobility, decreased ROM, decreased strength, hypomobility, increased fascial restrictions, increased muscle spasms, impaired flexibility, impaired sensation, impaired UE functional use, postural dysfunction, and pain.   ACTIVITY LIMITATIONS: carrying, lifting, bending, sleeping, bed mobility,  bathing, dressing, reach over head, and hygiene/grooming  PARTICIPATION LIMITATIONS: meal prep, cleaning, laundry, driving, shopping, community activity, and yard work  PERSONAL FACTORS: Age, Past/current experiences, Time since onset of injury/illness/exacerbation, and 3+ comorbidities: Anxiety, depression, history of arthritis/cervical disk disease, CAD, COPD, dyspnea on exertion, prostate cancer, squamous cell cancer of tongue, history of stroke, R RTC repair, R carotid stenosis, back pain.   are also affecting patient's functional outcome.   REHAB POTENTIAL: Good  CLINICAL DECISION MAKING: Evolving/moderate complexity  EVALUATION COMPLEXITY: Moderate  GOALS: Goals reviewed with patient? Yes  SHORT TERM GOALS: Target date: 11/26/2023   Patient will be independent with initial HEP.  Baseline: given Goal status: MET 11/12/23 met  2.  Patient will reports pain < 3/10 with ROM Baseline: 7-8/10 Goal status: MET - 12/06/23 no pain  3.  Patient will demonstrate ROM to 130 deg forward elevation and 30 deg ER  Baseline: 60 deg ff, 10 deg ER Goal status: IN PROGRESS  check ROM chart from 3/7   LONG TERM GOALS: Target date: 12/24/2023   Patient will be independent with advanced/ongoing HEP to improve outcomes and carryover.  Baseline:  Goal status: IN PROGRESS  2.  Patient will report 75% improvement in Right shoulder pain to improve QOL.  Baseline:  Goal status: MET- 12/06/23  3.   Patient to improve Right shoulder AROM to [facility] without pain provocation to allow for increased ease of ADLs.  Baseline: see objective Goal status: IN PROGRESS- 12/06/23 no pain just tight  4.  Patient will demonstrate improved functional UE strength as demonstrated by 4+/5 R shoulder strength. Baseline: see objective Goal status: IN PROGRESS- 12/03/23 see chart  5.  Patient will report 10 points improvement on QuickDash to demonstrate improved functional ability.  Baseline: 61.4% impairment Goal status: MET-  12/10/23  6.  Patient will be able to be able to lay with arms overhead comfortably to undergo radiation treatment.    Baseline: unable Goal status: IN PROGRESS - 12/06/23 able to do with no pain  PLAN:  PT FREQUENCY: 2x/week  PT DURATION: 8 weeks  PLANNED INTERVENTIONS: 97110-Therapeutic exercises, 97530- Therapeutic activity, 97112- Neuromuscular re-education, 97535- Self Care, [id number]- Manual therapy, 97014- Electrical stimulation (unattended), [id number]- Electrical stimulation (manual), 97016- Vasopneumatic device, [id number]- Ultrasound, 09811- Ionotophoresis 4mg /ml Dexamethasone, Patient/Family education, Taping, Dry Needling, Joint mobilization, Joint manipulation, Spinal manipulation, Spinal mobilization, Cryotherapy, and Moist heat  PLAN FOR NEXT SESSION: gentle mobilization for ROM, progress per protocol and tolerance, DOS 09/06/2023, 11 weeks post op as of 11/22/2023   Darleene Cleaver, PTA 12/10/2023, 1:21 PM

## 2023-12-12 ENCOUNTER — Other Ambulatory Visit: Payer: Self-pay

## 2023-12-12 ENCOUNTER — Ambulatory Visit: Payer: Medicare Other

## 2023-12-12 DIAGNOSIS — M25511 Pain in right shoulder: Secondary | ICD-10-CM | POA: Diagnosis not present

## 2023-12-12 DIAGNOSIS — M25611 Stiffness of right shoulder, not elsewhere classified: Secondary | ICD-10-CM

## 2023-12-12 DIAGNOSIS — M6281 Muscle weakness (generalized): Secondary | ICD-10-CM

## 2023-12-12 NOTE — Therapy (Signed)
 OUTPATIENT PHYSICAL THERAPY SHOULDER TREATMENT     Patient Name: [name] MRN: [id number] DOB:09/24/1951, 73 y.o., male Today's Date: 12/12/2023  END OF SESSION:  PT End of Session - 12/12/23 1245     Visit Number 12    Date for PT Re-Evaluation 12/24/23    Authorization Type Medicare + [employer] Supplement    Progress Note Due on Visit 20    PT Start Time 1149    PT Stop Time 1235    PT Time Calculation (min) 46 min    Activity Tolerance Patient tolerated treatment well    Behavior During Therapy WFL for tasks assessed/performed              Past Medical History:  Diagnosis Date   ADHD (attention deficit hyperactivity disorder)    Anxiety    Arthritis    "NECK, HANDS, KNEES" (06/14/2016)   Atypical mole 08/07/2021   Mid Back (Atypical Proliferation)   CAD (coronary artery disease)    Carotid artery stenosis    Complication of anesthesia    Reports that he had trouble swallowing post anesthesia- CABG   COPD (chronic obstructive pulmonary disease) (HCC) 12/11/2011   r/s mv - EF 72%; exercise capacity 13 METS; no exercised induced ischemic EKG changes   Depression, major    Dyspnea    with exertion   GERD (gastroesophageal reflux disease)    HTN (hypertension), benign 04/16/2008   echo - EF >55%; no regional wall or valvular abnormalities   Hyperlipidemia    statin intolerant   Hypothyroidism    Migraine    "crippling headaches as a kid; silent migraines now, no pain, couple times/week" (06/14/2016)   Peripheral vascular disease (HCC)    Prostate cancer (HCC) 04/2019   recent dx - Dr [name]   S/P angioplasty with stent 06/14/16 PCI & DES of high grade AV groove LCX and 1st OM  06/15/2016   Squamous cell cancer of tongue (HCC) ~ 2002   "35 radiation treatments at [facility]"   Stroke [facility])    hx Thalamic Ischemic CVA   Subclavian steal syndrome 06/17/2012   carotid doppler - R systolic brachial pressure , L ; R subclavian artery -  proxmial obsstruction w/ abnormal monophasic waveforms, R ECA known occlusive disease; L ECA narrowing w/ 70-99% diameter reductiona   TIA (transient ischemic attack) 2005   Viral hepatitis 1970s   "non specific"   Past Surgical History:  Procedure Laterality Date   BACK SURGERY     BIOPSY  02/20/2018   Procedure: BIOPSY;  Surgeon: [name], MD;  Location: [facility] END[facility];  Service: Endoscopy;;   BIOPSY TONGUE  ~ 2002   "base of tongue and uvula"   CARDIAC CATHETERIZATION  04/26/2008   L circumflex 99% stenosed midportion straddling a marginal branch; RCA total priximally w/ grade 2 L to R collaterals; renal arteries widely patent; LAD 30% segmental proximal stenosis   CARDIAC CATHETERIZATION N/A 05/14/2016   Procedure: Left Heart Cath and Cors/Grafts Angiography;  Surgeon: [name], MD;  Location: [facility] INV[facility];  Service: Cardiovascular;  Laterality: N/A;   CARDIAC CATHETERIZATION N/A 06/14/2016   Procedure: Coronary Stent Intervention;  Surgeon: [name], MD;  Location: [facility] INV[facility] CV [facility];  Service: Cardiovascular;  Laterality: N/A;   CERVICAL DISC SURGERY  2000   [place]    Posterior approach   COLONOSCOPY WITH PROPOFOL N/A 02/20/2018   Procedure: COLONOSCOPY WITH PROPOFOL;  Surgeon: [name], MD;  Location: [facility];  Service: Endoscopy;  Laterality: N/A;   CORONARY ANGIOPLASTY WITH STENT PLACEMENT     CORONARY ARTERY BYPASS GRAFT  2009   x5; LIMA to LAd, sequential vein to first diagonal branch, first and second obtuse marginal branches; vein to PDA   ESOPHAGOGASTRODUODENOSCOPY (EGD) WITH PROPOFOL N/A 02/20/2018   Procedure: ESOPHAGOGASTRODUODENOSCOPY (EGD) WITH PROPOFOL;  Surgeon: [name], MD;  Location: [facility];  Service: Endoscopy;  Laterality: N/A;   INSERTION OF MESH N/A 10/29/2017   Procedure: INSERTION OF MESH;  Surgeon: [name], MD;  Location: [facility] OR;  Service: General;  Laterality: N/A;   LUMBAR DISC SURGERY  06/17/2019    LUMBAR 2 & 3    LUMBAR LAMINECTOMY/DECOMPRESSION MICRODISCECTOMY N/A 06/17/2019   Procedure: Lumbar two -Lumbar three decompression and disectomy;  Surgeon: [name], MD;  Location: [facility] OR;  Service: Orthopedics;  Laterality: N/A;  3 hrs   POLYPECTOMY  02/20/2018   Procedure: POLYPECTOMY;  Surgeon: [name], MD;  Location: [facility];  Service: Endoscopy;;   REVERSE SHOULDER ARTHROPLASTY Right 09/06/2023   Procedure: REVERSE SHOULDER ARTHROPLASTY;  Surgeon: [name], MD;  Location: [facility] ORS;  Service: Orthopedics;  Laterality: Right;  interscalene  100   SHOULDER ARTHROSCOPY WITH SUBACROMIAL DECOMPRESSION AND OPEN ROTATOR C Left 09/28/2013   Procedure: LEFT SHOULDER ARTHROSCOPY WITH SUBACROMIAL DECOMPRESSION AND MINI OPEN ROTATOR CUFF REPAIR, OPEN DISTAL CLAVICLE RESECTION AND OPEN BICEP TENDODESIS ;  Surgeon: [name], MD;  Location: [facility] OR;  Service: Orthopedics;  Laterality: Left;   TRANSCAROTID ARTERY REVASCULARIZATION  Right 05/05/2021   Procedure: RIGHT TRANSCAROTID ARTERY REVASCULARIZATION;  Surgeon: [name], MD;  Location: [facility] OR;  Service: Vascular;  Laterality: Right;   UMBILICAL HERNIA REPAIR N/A 10/29/2017   Procedure: UMBILICAL HERNIA REPAIR;  Surgeon: [name], MD;  Location: [facility] OR;  Service: General;  Laterality: N/A;   Patient Active Problem List   Diagnosis Date Noted   Carotid stenosis, right 05/05/2021   Former smoker 03/24/2020   SOB (shortness of breath) 03/24/2020   Lumbar disc herniation 06/17/2019   Right lumbar radiculopathy 05/16/2019   Elevated PSA 05/16/2019   S/P angioplasty with stent 06/14/16 PCI & DES of high grade AV groove LCX and 1st OM  06/15/2016   CAD (coronary artery disease) 06/14/2016   Abnormal stress test    Carotid artery disease (HCC) 05/20/2013   Hypothyroidism 03/29/2012   Erectile dysfunction 07/02/2011   Anxiety 07/02/2011   Back pain 07/02/2011   Cervical disc disease 07/02/2011   Coronary artery  disease 04/29/2011   Squamous cell carcinoma of tongue (HCC) 04/29/2011   Hypertension 04/29/2011   Depression 04/29/2011   GE reflux 04/29/2011   Internal carotid artery stenosis 04/29/2011   Ptosis of eyelid, left 04/29/2011   Hyperlipidemia 04/29/2011   History of ischemic vertebrobasilar artery thalamic stroke 04/29/2011   Attention deficit disorder 04/29/2011   Degenerative joint disease of cervical spine 04/29/2011    PCP: Margaree Mackintosh, MD    REFERRING PROVIDER: [name], MD  REFERRING DIAG:  Z47.89 (ICD-10-CM) - Orthopedic aftercare  [phone] (ICD-10-CM) - S/p reverse total shoulder arthroplasty    THERAPY DIAG:  Acute pain of right shoulder  Stiffness of right shoulder, not elsewhere classified  Muscle weakness (generalized)  Rationale for Evaluation and Treatment: Rehabilitation  ONSET DATE: DOS R shoulder rTSA 09/06/2023  SUBJECTIVE:  SUBJECTIVE STATEMENT: Accompanied by wife today. Pt and wife report that he will be undergoing chemotherapy in the next several weeks and so today will be his last appt for the time being.  He returns to ortho 12/25/23. Hand dominance: Left  NEXT MD VISIT:  12/24/23  PERTINENT HISTORY: Anxiety, depression, history of arthritis/cervical disk disease, CAD, COPD, dyspnea on exertion, prostate cancer, squamous cell cancer of tongue, history of stroke, R RTC repair, R carotid stenosis, back pain.   PAIN:  Are you having pain? Yes: NPRS scale: 5/10 Pain location: R anterior shoulder and bicep Pain description: sharp, achey Aggravating factors: moving wrong, sleeping on it it. Relieving factors: Percocet, muscle relaxers  PRECAUTIONS: None  RED FLAGS: None   WEIGHT BEARING RESTRICTIONS: No  FALLS:  Has patient fallen in last 6 months?   Occasionally tripping over the dogs.   LIVING ENVIRONMENT: Lives with: lives with their spouse Lives in: House/apartment Stairs: Yes: Internal: 14 steps; on left going up and External: 2 steps; on left going up Has following equipment at home: None  OCCUPATION: retired  PLOF: Independent and Leisure: woodworking  PATIENT GOALS: getting back to doing stuff and not being miserable.  Also has a couple of nodules in lungs, needs to be able to have arms up.   OBJECTIVE:   DIAGNOSTIC FINDINGS:  10/22/2023 Imaging: AP and scapular Y views of the right shoulder show a well-fixed and well-positioned right reverse TSA. No periprosthetic fractures or acromial fractures noted.    PATIENT SURVEYS:  Quick Dash 61.4%  COGNITION: Overall cognitive status: Within functional limits for tasks assessed     SENSATION: Reports pins/needles at night in R hand  POSTURE: Forward head, rounded shoulders.   UPPER EXTREMITY ROM:   Active ROM Right eval Left eval R 11/07/23 R 11/12/23 R 11/21/23 R 12/06/23  Shoulder flexion 60 135 100 120 108 120  Shoulder extension 35 45 35     Shoulder abduction 45 85 94 90 100 110  Shoulder adduction        Shoulder internal rotation 50 F- to ear 65 F- back of head  FIR- to glutes   FIR to L5  Shoulder external rotation 10 F-to hip 70 F-small of back FER- to UT muscle 10 21 FER- to C4-5  (Blank rows = not tested)  UPPER EXTREMITY MMT:  MMT Right eval Left eval R 12/03/23  Shoulder flexion 2 5 3+  Shoulder extension 2    Shoulder abduction 2 5 3+  Shoulder internal rotation 2 5 3+  Shoulder external rotation 2 5 3+  Elbow flexion 4 5 5   Elbow extension 4p! 5 5  Wrist flexion     Wrist extension     Grip strength (lbs)     (Blank rows = not tested)  JOINT MOBILITY TESTING:  Decreased mobility all directions in [facility] joint  PALPATION:   Tenderness posterior R shoulder.   TODAY'S TREATMENT:  DATE: 12/12/23: UBE Level 1 3 min  Supine R shoulder ER stretch Seated R shoulder flexion to 90, initially with 1#, , pt with R lat/sup shoulder pain so removed the wt Seated R shoulder abd to 90 able to complete 30 reps with 1# Seated biceps curls 2# 3 x 10 Standing for lateral step outs with red t band, to L to engage IR and medial strength Standing for lat step outs with yellow t band to R for ER and lat strength Inst in paloff presses with yellow and red t band as above    12/10/23 Shld ext RTB 2 x12 Mid rows RTB 2x12 IR iso step out RTB 10x ER iso step out YTB 10x STM to middle and anterior deltoids ER passive stretch QuickDASH Score: 31.8 / 100 = 31.8 %  12/06/23 Therapeutic Exercise: and activity Pulley 2 min flexion Rows RTB 2x10 Shld ext RTB 2x10 Standing shoulder flexion 1lb 2x5- starts to pinch along middle deltoids Standing shoulder abduction x 10 Finger ladder flexion x 10 PROM end range stretching for ER Assessment of goals for 10th visit PN  12/03/23 Therapeutic Exercise: to improve strength and mobility.  Demo, verbal and tactile cues throughout for technique.  Pulleys: 1 min flexion, 1 min scaption Supine shoulder flexion with 1lb x 10 Serratus punch 1lb x 10 MMT Wall slide flexion 1lb cuff weight 2x5 Isometric step out IR YTB x 10; ER YTB x 10 Rows RTB x 10- cues to avoid bicep curling Shoulder ext RTB x x 10  11/28/23 Shoulder AAROM wall slide into flexion x 10 AROM wall slide flexion 2x5 Standing ER with wand 2 x 10  Isometric ER YTB -p! Isometric IR YTB x 5x5"  STM to middle and posterior deltoids Shoulder mobilization into end range ER   11/21/23 Shoulder AAROM wall slide into flexion x 10 Finger ladder x 5 flexion Standing rows YTB 2x10 Standing shoulder ext YTB 2x10 Up/down and side to side stabilization with orange pball x 10 PROM stretching for R shoulder all  directions; PNF contract/relax for ER Measured shoulder ROM 11/19/23 Therapeutic Activity: to improve functional performance. Shoulder AAROM wall slide into flexion x 10 Standing rows YTB x 10 Standing shoulder ext YTB x 10 Standing shoulder AAROM flexion with wand x 10 Shoulder abduction AAROM with wand x 10 Standing ER with wand x 10 AAROM Manual Therapy: to decrease muscle spasm and pain and improve mobility STM to R UT, middle and pot deltoids Passive stretching for ER flexion and abd 11/14/23 Therapeutic Exercise: to improve strength and mobility.  Demo, verbal and tactile cues throughout for technique. Supine R shoulder ER  with wand x 10 Supine chest press with wand x 12 Seated rows YTB x 10 Seated shoulder extension YTB x 10- cues for posture Seated AAROM prayer raise x 10  Seated abduction with wand x 10  Manual Therapy: to decrease muscle spasm and pain and improve mobility Mobs to [facility] joint all direction PROM using short lever arm to decrease discomfort  11/12/23 Therapeutic Exercise: to improve strength and mobility.  Demo, verbal and tactile cues throughout for technique. Pulleys x 2 min flexion - reports stabbing pain in R shoulder Table slides flexion - better tolerated Windshield wipers on table - not getting ER, IR only Scapular punches 2 x 10  AAROM shoulder flexion with hands clasped Supine shoulder ER with cane supine Seated ER at table - tolerated well Manual Therapy: to decrease muscle spasm and pain and improve  mobility Mobs to [facility] joint all direction, traction and ER to [facility] joint using short lever arm to decrease discomfort, STM/TPR to pectoralis, deltoids.    11/07/23  Therapeutic Exercise: to improve strength and mobility.  Demo, verbal and tactile cues throughout for technique.  R shoulder PROM flex, abd, ER gentle stretching Supine shoulder flexion with wand x 5 Supine chest press with wand x 10 Supine R ER with wand x 10 Supine R shoulder abduction  with wand x 10 Seated R shoulder AA flexion with cane x 10 Pulleys review  11/05/23 Therapeutic Exercise: to improve strength and mobility.  Demo, verbal and tactile cues throughout for technique. AAROM flexion with green pball 2 x 5 Scap retraction x 15 Supine shoulder flexion AA with dowel 2x5 PROM to R shoulder very gentle all directions, very limited ROM esp ER Seated shoulder swifer flexion x 5; scaption x 5 Seated ER AA with dowel x 5- increased pain after Manual Therapy: to decrease muscle spasm, pain and improve mobility.  STM to R anterior, lateral, posterior deltoids     PATIENT EDUCATION: Education details: HEP update Person educated: Patient Education method: Explanation, Demonstration, Verbal cues, and Handouts Education comprehension: verbalized understanding and returned demonstration  HOME EXERCISE PROGRAM: Access Code: [email or web address] URL: [email or web address] Date: 12/12/2023 Prepared by: Caralee Ates  Exercises - Seated Single Arm Bicep Curls with Rotation and Dumbbell  - 1 x daily - 7 x weekly - 3 sets - 10 reps - Anti-Rotation Press With Sidesteps and Anchored Resistance  - 1 x daily - 7 x weekly - 3 sets - 10 reps - Standing Anti-Rotation Press with Anchored Resistance  - 1 x daily - 7 x weekly - 3 sets - 10 reps - Seated Single Arm Shoulder Horizontal Abduction with Dumbbell - Palm Down  - 1 x daily - 7 x weekly - 3 sets - 10 reps - Seated Single Arm Shoulder Flexion with Dumbbells  - 1 x daily - 7 x weekly - 3 sets - 10 reps  Access Code: [email or web address] URL: [email or web address] Date: 12/03/2023 Prepared by: Verta Ellen  Exercises - Seated Scapular Retraction  - 1 x daily - 7 x weekly - 1 sets - 5 reps - 5 sec hold - Supine Single Arm Shoulder Protraction  - 1 x daily - 7 x weekly - 2 sets - 10 reps - Supine Shoulder Flexion AAROM with Hands Clasped  - 1 x daily - 7 x weekly - 2 sets - 10 reps - Seated Shoulder External Rotation  PROM on Table  - 1 x daily - 7 x weekly - 1 sets - 5 reps - 15 sec hold - Standing Shoulder Row with Anchored Resistance  - 1 x daily - 7 x weekly - 2 sets - 10 reps - Shoulder extension with resistance - Neutral  - 1 x daily - 7 x weekly - 2 sets - 10 reps - Sidelying Hip Abduction  - 1 x daily - 7 x weekly - 3 sets - 10 reps - Shoulder Internal Rotation Reactive Isometrics  - 1 x daily - 3 x weekly - 2 sets - 10 reps - Shoulder external rotation  - 1 x daily - 3 x weekly - 2 sets - 10 reps - Standing Shoulder Flexion to 90 Degrees with Dumbbells  - 1 x daily - 3 x weekly - 2 sets - 10 reps  ASSESSMENT:  CLINICAL IMPRESSION: Pt returned today for skilled physical therapy to address ongoing recovery from  R reverse TSA.  He and his wife indicated partially through the treatment today that he needs to stop PT today in order to start chemo next week. Therefore progressed and provided with appropriate exercises to continue for the next month.  His strength and ROM were essentially similar to last measurements one week ago.  Spent time explaining expected outcomes for ROM and strength for this type of surgery, and condensed, provided with an applicable strengthening routine for home which he can follow for the next month to improve his overall function.  OBJECTIVE IMPAIRMENTS: decreased activity tolerance, decreased mobility, decreased ROM, decreased strength, hypomobility, increased fascial restrictions, increased muscle spasms, impaired flexibility, impaired sensation, impaired UE functional use, postural dysfunction, and pain.   ACTIVITY LIMITATIONS: carrying, lifting, bending, sleeping, bed mobility, bathing, dressing, reach over head, and hygiene/grooming  PARTICIPATION LIMITATIONS: meal prep, cleaning, laundry, driving, shopping, community activity, and yard work  PERSONAL FACTORS: Age, Past/current experiences, Time since onset of injury/illness/exacerbation, and 3+ comorbidities: Anxiety,  depression, history of arthritis/cervical disk disease, CAD, COPD, dyspnea on exertion, prostate cancer, squamous cell cancer of tongue, history of stroke, R RTC repair, R carotid stenosis, back pain.   are also affecting patient's functional outcome.   REHAB POTENTIAL: Good  CLINICAL DECISION MAKING: Evolving/moderate complexity  EVALUATION COMPLEXITY: Moderate  GOALS: Goals reviewed with patient? Yes  SHORT TERM GOALS: Target date: 11/26/2023   Patient will be independent with initial HEP.  Baseline: given Goal status: MET 11/12/23 met  2.  Patient will reports pain < 3/10 with ROM Baseline: 7-8/10 Goal status: MET - 12/06/23 no pain  3.  Patient will demonstrate ROM to 130 deg forward elevation and 30 deg ER  Baseline: 60 deg ff, 10 deg ER Goal status: IN PROGRESS check ROM chart from 3/7   LONG TERM GOALS: Target date: 12/24/2023   Patient will be independent with advanced/ongoing HEP to improve outcomes and carryover.  Baseline:  Goal status: IN PROGRESS  2.  Patient will report 75% improvement in Right shoulder pain to improve QOL.  Baseline:  Goal status: MET- 12/06/23  3.   Patient to improve Right shoulder AROM to [facility] without pain provocation to allow for increased ease of ADLs.  Baseline: see objective Goal status: IN PROGRESS- 12/06/23 no pain just tight  4.  Patient will demonstrate improved functional UE strength as demonstrated by 4+/5 R shoulder strength. Baseline: see objective Goal status: IN PROGRESS- 12/03/23 see chart  5.  Patient will report 10 points improvement on QuickDash to demonstrate improved functional ability.  Baseline: 61.4% impairment Goal status: MET- 12/10/23  6.  Patient will be able to be able to lay with arms overhead comfortably to undergo radiation treatment.    Baseline: unable Goal status: IN PROGRESS - 12/06/23 able to do with no pain  PLAN:  PT FREQUENCY: 2x/week  PT DURATION: 8 weeks  PLANNED INTERVENTIONS: 97110-Therapeutic  exercises, 97530- Therapeutic activity, 97112- Neuromuscular re-education, 97535- Self Care, 16109- Manual therapy, 97014- Electrical stimulation (unattended), [id number]- Electrical stimulation (manual), 97016- Vasopneumatic device, [id number]- Ultrasound, [id number]- Ionotophoresis 4mg /ml Dexamethasone, Patient/Family education, Taping, Dry Needling, Joint mobilization, Joint manipulation, Spinal manipulation, Spinal mobilization, Cryotherapy, and Moist heat  PLAN FOR NEXT SESSION: DC today per pt as he will start chemo next week.  Jakeira Seeman L Sharmane Dame, PT, DPT, OCS 12/12/2023, 1:15 PM

## 2023-12-16 ENCOUNTER — Encounter: Payer: Self-pay | Admitting: Cardiovascular Disease

## 2023-12-16 ENCOUNTER — Ambulatory Visit: Payer: Medicare Other | Attending: Cardiovascular Disease | Admitting: Cardiovascular Disease

## 2023-12-16 VITALS — BP 110/54 | HR 69 | Ht 67.0 in | Wt 147.0 lb

## 2023-12-16 DIAGNOSIS — I2510 Atherosclerotic heart disease of native coronary artery without angina pectoris: Secondary | ICD-10-CM | POA: Diagnosis present

## 2023-12-16 DIAGNOSIS — I6523 Occlusion and stenosis of bilateral carotid arteries: Secondary | ICD-10-CM | POA: Insufficient documentation

## 2023-12-16 DIAGNOSIS — E782 Mixed hyperlipidemia: Secondary | ICD-10-CM | POA: Diagnosis not present

## 2023-12-16 DIAGNOSIS — I10 Essential (primary) hypertension: Secondary | ICD-10-CM | POA: Insufficient documentation

## 2023-12-16 NOTE — Assessment & Plan Note (Signed)
 History of carotid artery disease status post right ICA stenting by myself in the past with Dopplers performed 03/06/2023 revealing a patent right internal carotid artery stent with moderate left ICA stenosis.  This will be repeated on an annual basis.

## 2023-12-16 NOTE — Progress Notes (Signed)
 12/16/2023 Lyn Henri   October 30, 1950  [id number]  Primary Physician Baxley, Luanna Cole, MD Primary Cardiologist: [name] MD FACP, [place], [place], [place]  HPI:  MATEUSZ NEILAN is a 73 y.o.   thin-appearing, married Caucasian male, father of 1, grandfather to 4 grandchildren who I last saw in the office 11/28/2022.Marland Kitchen  He is accompanied by his wife Rosalita Chessman today.  He has a history of coronary artery disease status post bypass grafting x5 by Dr. Andrey Spearman June 16, 2008, with a free LIMA to his LAD, sequential vein to the first diagonal branch, first and second obtuse marginal branches, as well as a vein to the PDA. His other problems include hypertension and hyperlipidemia. He denies chest pain or shortness of breath. I did perform cerebral angiography on him May 05, 2008, because of a question of a high-grade internal carotid artery stenosis read by the radiologist. However, this turned out to be a left external carotid artery stenosis. He does have high-grade ostial left internal mammary artery stenosis which necessitated the use of a free LIMA. He is statin intolerant. 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 A Myoview stress test performed December 11, 2011, was normal and carotid Dopplers did show a moderately severe right ICA stenosis which we have been following by duplex ultrasound. 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 Since I saw him 12 months ago he has noticed increasing dyspnea on exertion over the last several months, especially noticeable while mowing his lawn. I obtained a Myoview stress test on him based on this 03/29/16 that showed a large reversible lateral wall perfusion defect  consistent with ischemia. He underwent outpatient cardiac  catheterization by myself on 05/14/16 revealing severe native and graft vessel disease with normal LV function. Specifically, his RCA vein graft was occluded to an occluded dominant right and his circumflex vein graft which was sequential to 2 obtuse marginal branches was occluded at the aorta to patent continuation between OM1 and 2. He did have native circumflex disease. Dr. Dorris Fetch saw him during his brief hospitalization to discuss the possibility of redo coronary artery bypass grafting although the consensus was that he would be better served with attempted PCI and stenting of his native circumflex coronary artery. I perform this electively on 06/14/16 and implanted a 2.25 mm x 38 mm long synergy drug-eluting stent in his AV groove circumflex into his obtuse marginal branch. He had excellent angiographic and clinical result. He was discharged on the following day. His dyspnea ultimately resolved. Since I saw him in the office 7 months ago he's remained clinically stable. He does have a right shoulder orthopedic issue but apparently he is not a surgical candidate.     He does complain of some breathing abnormality which does not does not necessarily sound like shortness of breath.  He had an excellent lipid profile performed by his PCP on 12/04/2018 with a total cholesterol 162, LDL 68 and HDL 72 on Praluent.  He has developed increasing PSA to 13 and ultimately underwent radical prostatectomy.  He is currently on hormonal therapy for this.   He underwent total laryngectomy in October 2023 as well as partial thyroidectomy.    Since I saw him a year ago he is remained stable.  He was diagnosed with metastatic lung cancer and is scheduled to start chemotherapy at [facility] next week.  Otherwise he has mild dyspnea which has not changed and denies chest pain.  Carotid Dopplers reveal a patent right ICA stent.   Current Meds  Medication Sig   acetaminophen (TYLENOL) 500 MG tablet Take 500 mg by mouth  every 6 (six) hours as needed for mild pain (pain score 1-3) or moderate pain (pain score 4-6).   albuterol (VENTOLIN HFA) 108 (90 Base) MCG/ACT inhaler Inhale 2 puffs into the lungs every 6 (six) hours as needed for wheezing or shortness of breath.   amphetamine-dextroamphetamine (ADDERALL) 20 MG tablet Take 1 tablet (20 mg total) by mouth in the morning, at noon, and at bedtime.   aspirin EC 81 MG tablet Take 81 mg by mouth daily. Swallow whole.   buPROPion (WELLBUTRIN XL) 300 MG 24 hr tablet Take 1 tablet (300 mg total) by mouth every morning.   clotrimazole (LOTRIMIN AF) 1 % cream Apply 1 Application topically 2 (two) times daily. (Patient taking differently: Apply 1 Application topically daily as needed (Rash).)   Cyanocobalamin 2500 MCG SUBL Place 2,500 mcg under the tongue once a week.   dexamethasone (DECADRON) 4 MG tablet Take 1 tablet (4 mg total) by mouth in the morning and 1 tablet (4 mg total) in the evening. Take with meals. Take prescribed dose on the 3 days AFTER chemotherapy infusion treatment.   Evolocumab (REPATHA SURECLICK) 140 MG/ML SOAJ Inject 140 mg into the skin every 14 (fourteen) days.   levothyroxine (SYNTHROID) 100 MCG tablet Take 1 tablet (100 mcg total) by mouth in the morning.   LORazepam (ATIVAN) 1 MG tablet Take 1 tablet (1 mg total) by mouth 2 to 3 times a day as needed. ( no more than 2.5 per day) (Patient taking differently: Take 1 mg by mouth at bedtime.)   metoprolol succinate (TOPROL XL) 25 MG 24 hr tablet Take 1 tablet (25 mg total) by mouth daily.   nystatin (MYCOSTATIN) 100000 UNIT/ML suspension Take 5 mLs (500,000 Units total) by mouth 4 (four) times daily for 10 days.   ondansetron (ZOFRAN) 8 MG tablet Take 1 tablet (8 mg total) by mouth every 8 (eight) hours as needed for nausea or vomiting.   ondansetron (ZOFRAN-ODT) 4 MG disintegrating tablet Take 4 mg by mouth every 8 (eight) hours as needed.   pantoprazole (PROTONIX) 40 MG tablet Take 1 tablet (40 mg  total) by mouth daily.   prochlorperazine (COMPAZINE) 10 MG tablet Take 1 tablet (10 mg total) by mouth every 6 (six) hours as needed for nausea or vomiting.   sertraline (ZOLOFT) 100 MG tablet Take 1 tablet (100 mg total) by mouth daily.   Current Facility-Administered Medications for the 12/16/23 encounter (Office Visit) with [name], MD  Medication   ticagrelor [facility]) tablet 90 mg     Allergies  Allergen Reactions   Crestor [Rosuvastatin Calcium] Other (See Comments)    Difficulty swallowing   Tramadol Shortness Of Breath   Lisinopril Cough   Gabapentin Other (See Comments)    Hallucinations   Tricor [Fenofibrate] Other (See Comments)    UNSPECIFIED REACTION   Lipitor [Atorvastatin Calcium] Other (See Comments)    Muscle spasms    Social History   Socioeconomic History   Marital status: Married    Spouse name: Not on file   Number of children: Not on file   Years of education: Not on file   Highest  education level: Associate degree: occupational, Scientist, product/process development, or vocational program  Occupational History   Not on file  Tobacco Use   Smoking status: Former    Current packs/day: 0.00    Average packs/day: 3.0 packs/day for 30.0 years (90.0 ttl pk-yrs)    Types: Cigarettes    Start date: 01/30/1971    Quit date: 01/29/2001    Years since quitting: 22.8   Smokeless tobacco: Never  Vaping Use   Vaping status: Never Used  Substance and Sexual Activity   Alcohol use: Yes    Alcohol/week: 2.0 standard drinks of alcohol    Types: 2 Cans of beer per week    Comment: 2-3 beers per week   Drug use: No   Sexual activity: Not Currently  Other Topics Concern   Not on file  Social History Narrative   Not on file   Social Drivers of Health   Financial Resource Strain: Low Risk  (11/28/2023)   Overall Financial Resource Strain (CARDIA)    Difficulty of Paying Living Expenses: Not hard at all  Food Insecurity: No Food Insecurity (11/28/2023)   Hunger Vital Sign     Worried About Running Out of Food in the Last Year: Never true    Ran Out of Food in the Last Year: Never true  Transportation Needs: No Transportation Needs (11/28/2023)   PRAPARE - Administrator, Civil Service (Medical): No    Lack of Transportation (Non-Medical): No  Physical Activity: Insufficiently Active (11/28/2023)   Exercise Vital Sign    Days of Exercise per Week: 1 day    Minutes of Exercise per Session: 60 min  Stress: Stress Concern Present (11/28/2023)   [employer] of Occupational Health - Occupational Stress Questionnaire    Feeling of Stress : Very much  Social Connections: Moderately Isolated (11/28/2023)   Social Connection and Isolation Panel [NHANES]    Frequency of Communication with Friends and Family: Twice a week    Frequency of Social Gatherings with Friends and Family: Once a week    Attends Religious Services: Never    Database administrator or Organizations: No    Attends Banker Meetings: Never    Marital Status: Married  Catering manager Violence: Not At Risk (06/14/2023)   Humiliation, Afraid, Rape, and Kick questionnaire    Fear of Current or Ex-Partner: No    Emotionally Abused: No    Physically Abused: No    Sexually Abused: No     Review of Systems: General: negative for chills, fever, night sweats or weight changes.  Cardiovascular: negative for chest pain, dyspnea on exertion, edema, orthopnea, palpitations, paroxysmal nocturnal dyspnea or shortness of breath Dermatological: negative for rash Respiratory: negative for cough or wheezing Urologic: negative for hematuria Abdominal: negative for nausea, vomiting, diarrhea, bright red blood per rectum, melena, or hematemesis Neurologic: negative for visual changes, syncope, or dizziness All other systems reviewed and are otherwise negative except as noted above.    Blood pressure (!) 110/54, pulse 69, height 5\' 7"  (1.702 m), weight 147 lb (66.7 kg), SpO2 91%.   General appearance: alert and no distress Neck: no adenopathy, no carotid bruit, no JVD, supple, symmetrical, trachea midline, and thyroid not enlarged, symmetric, no tenderness/mass/nodules Lungs: clear to auscultation bilaterally Heart: regular rate and rhythm, S1, S2 normal, no murmur, click, rub or gallop Extremities: extremities normal, atraumatic, no cyanosis or edema Pulses: 2+ and symmetric Skin: Skin color, texture, turgor normal. No rashes or lesions Neurologic: Grossly normal  EKG not performed today      ASSESSMENT AND PLAN:   Coronary artery disease History of CAD status post coronary bypass grafting 06/16/08 x 5 by Dr. Dorris Fetch with a LIMA to the LAD, sequential vein to the first diagonal branch, first and second obtuse marginal branches and to the PDA.  I performed cardiac catheterization on him 05/14/2016 revealing severe native vessel disease, occluded vein to the RCA, occluded vein to the circumflex from the aorta to OM with a patent continuation to the second OM.  Ultimately performed PCI and drug-eluting stenting of the AV groove circumflex with a 2.25 x 38 mm Synergy drug-eluting stent into his obtuse marginal branch with excellent result.  He was asymptomatic after that.  Hypertension History of essential hypertension blood pressure measured today 110/54.  He is on metoprolol.  Internal carotid artery stenosis History of carotid artery disease status post right ICA stenting by myself in the past with Dopplers performed 03/06/2023 revealing a patent right internal carotid artery stent with moderate left ICA stenosis.  This will be repeated on an annual basis.  Hyperlipidemia History of hyperlipidemia on Repatha with lipid profile performed 06/11/2023 revealing total cholesterol 120, LDL 48 and HDL 49.     [name] MD FACP,FACC,FAHA, [facility] 12/16/2023 1:54 PM

## 2023-12-16 NOTE — Assessment & Plan Note (Signed)
 History of hyperlipidemia on Repatha with lipid profile performed 06/11/2023 revealing total cholesterol 120, LDL 48 and HDL 49.

## 2023-12-16 NOTE — Assessment & Plan Note (Signed)
 History of essential hypertension blood pressure measured today 110/54.  He is on metoprolol.

## 2023-12-16 NOTE — Assessment & Plan Note (Signed)
 History of CAD status post coronary bypass grafting 06/16/08 x 5 by Dr. Dorris Fetch with a LIMA to the LAD, sequential vein to the first diagonal branch, first and second obtuse marginal branches and to the PDA.  I performed cardiac catheterization on him 05/14/2016 revealing severe native vessel disease, occluded vein to the RCA, occluded vein to the circumflex from the aorta to OM with a patent continuation to the second OM.  Ultimately performed PCI and drug-eluting stenting of the AV groove circumflex with a 2.25 x 38 mm Synergy drug-eluting stent into his obtuse marginal branch with excellent result.  He was asymptomatic after that.

## 2023-12-16 NOTE — Patient Instructions (Signed)
 Medication Instructions:  Your physician recommends that you continue on your current medications as directed. Please refer to the Current Medication list given to you today.  *If you need a refill on your cardiac medications before your next appointment, please call your pharmacy*   Testing/Procedures: Your physician has requested that you have a carotid duplex. This test is an ultrasound of the carotid arteries in your neck. It looks at blood flow through these arteries that supply the brain with blood. Allow one hour for this exam. There are no restrictions or special instructions. This will take place at [address], [address]. **To do in June**  Please note: We ask at that you not bring children with you during ultrasound (echo/ vascular) testing. Due to room size and safety concerns, children are not allowed in the ultrasound rooms during exams. Our front office staff cannot provide observation of children in our lobby area while testing is being conducted. An adult accompanying a patient to their appointment will only be allowed in the ultrasound room at the discretion of the ultrasound technician under special circumstances. We apologize for any inconvenience.    Follow-Up: At [facility], you and your health needs are our priority.  As part of our continuing mission to provide you with exceptional heart care, we have created designated Provider Care Teams.  These Care Teams include your primary Cardiologist (physician) and Advanced Practice Providers (APPs -  Physician Assistants and Nurse Practitioners) who all work together to provide you with the care you need, when you need it.  We recommend signing up for the patient portal called "MyChart".  Sign up information is provided on this After Visit Summary.  MyChart is used to connect with patients for Virtual Visits (Telemedicine).  Patients are able to view lab/test results, encounter notes, upcoming appointments, etc.   Non-urgent messages can be sent to your provider as well.   To learn more about what you can do with MyChart, go to [email or web address].    Your next appointment:   12 month(s)  Provider:   Nanetta Batty, MD     Other Instructions   1st Floor: - Lobby - Registration  - Pharmacy  - Lab - Cafe  2nd Floor: - PV Lab - Diagnostic Testing (echo, CT, nuclear med)  3rd Floor: - Vacant  4th Floor: - TCTS (cardiothoracic surgery) - AFib Clinic - Structural Heart Clinic - Vascular Surgery  - Vascular Ultrasound  5th Floor: - [facility] Cardiology (general and EP) - Clinical Pharmacy for coumadin, hypertension, lipid, weight-loss medications, and med management appointments    Valet parking services will be available as well.

## 2023-12-17 ENCOUNTER — Encounter: Payer: Medicare Other | Admitting: Physical Therapy

## 2023-12-18 ENCOUNTER — Encounter: Payer: Self-pay | Admitting: Internal Medicine

## 2023-12-18 ENCOUNTER — Telehealth: Payer: Self-pay | Admitting: Internal Medicine

## 2023-12-18 NOTE — Telephone Encounter (Signed)
 I did call patient and let them know you were ut of office until Thursday afternoon and may not get back with them until them.

## 2023-12-18 NOTE — Telephone Encounter (Signed)
 Copied from CRM [id number]. Topic: Clinical - Lab/Test Results >> Dec 18, 2023 11:36 AM DeAngela L wrote: Reason for CRM: Patients wife called about TSH levels of 11.424 and the oncologist suggested contacting the primary and find out what the Dr would like to do with the results that are in [place], the oncologist says he could monitor but would like the Dr to suggest first  Please call the wife / Fannie Knee back at [phone]

## 2023-12-24 ENCOUNTER — Encounter: Payer: Medicare Other | Admitting: Physical Therapy

## 2024-01-07 ENCOUNTER — Other Ambulatory Visit (HOSPITAL_BASED_OUTPATIENT_CLINIC_OR_DEPARTMENT_OTHER): Payer: Self-pay

## 2024-01-07 ENCOUNTER — Other Ambulatory Visit: Payer: Self-pay

## 2024-01-07 MED ORDER — PRIMIDONE 50 MG PO TABS
50.0000 mg | ORAL_TABLET | Freq: Two times a day (BID) | ORAL | 5 refills | Status: DC
  Filled 2024-01-07: qty 60, 30d supply, fill #0

## 2024-01-07 MED ORDER — BACLOFEN 5 MG PO TABS
5.0000 mg | ORAL_TABLET | Freq: Two times a day (BID) | ORAL | 5 refills | Status: DC
  Filled 2024-01-07: qty 60, 30d supply, fill #0
  Filled 2024-02-21: qty 60, 30d supply, fill #1

## 2024-01-08 ENCOUNTER — Other Ambulatory Visit (HOSPITAL_BASED_OUTPATIENT_CLINIC_OR_DEPARTMENT_OTHER): Payer: Self-pay

## 2024-01-08 MED ORDER — LEVOTHYROXINE SODIUM 112 MCG PO TABS
112.0000 ug | ORAL_TABLET | Freq: Every morning | ORAL | 3 refills | Status: DC
  Filled 2024-01-08: qty 30, 30d supply, fill #0

## 2024-01-09 ENCOUNTER — Encounter: Payer: Self-pay | Admitting: Internal Medicine

## 2024-01-09 DIAGNOSIS — R42 Dizziness and giddiness: Secondary | ICD-10-CM

## 2024-01-10 ENCOUNTER — Other Ambulatory Visit: Payer: Self-pay

## 2024-01-10 NOTE — Telephone Encounter (Signed)
 Spoke to patients wife, and patient is asking for referral to be sent to [facility], electronically with his ENT Dr. Christoper Allegra.

## 2024-01-15 ENCOUNTER — Other Ambulatory Visit (HOSPITAL_BASED_OUTPATIENT_CLINIC_OR_DEPARTMENT_OTHER): Payer: Self-pay

## 2024-01-15 MED ORDER — LORAZEPAM 1 MG PO TABS
1.0000 mg | ORAL_TABLET | Freq: Three times a day (TID) | ORAL | 0 refills | Status: DC
  Filled 2024-01-15: qty 225, 90d supply, fill #0

## 2024-01-23 ENCOUNTER — Encounter: Payer: Self-pay | Admitting: Internal Medicine

## 2024-01-24 ENCOUNTER — Other Ambulatory Visit

## 2024-01-24 DIAGNOSIS — E039 Hypothyroidism, unspecified: Secondary | ICD-10-CM

## 2024-01-24 LAB — TSH: TSH: 10.46 m[IU]/L — ABNORMAL HIGH (ref 0.40–4.50)

## 2024-01-24 NOTE — Progress Notes (Signed)
 Patient Care Team: Sylvan Evener, MD as PCP - General (Internal Medicine) Avanell Leigh, MD as PCP - Cardiology (Cardiology) Dareen Ebbing, MD as Consulting Physician (General Surgery) Devon Fogo, MD (Inactive) as Consulting Physician (Dermatology)  Visit Date: 01/28/24  Subjective:   Chief Complaint  Patient presents with   Follow-up    Thyroid  results.   Patient [name] Becvar,Male DOB:02/10/1951,73 y.o. WUJ:[id number]   73 y.o. Male, here with his wife, presents today for 6 weeks follow-up on elevated TSH. Patient has a past medical history of Malignant Neoplasm Squamous Cell Carcinoma, Hypopharynx; S/p Laryngopharyngectomy; S/p Right Reverse Shoulder Arthroplasty; HLD; CAD; S/p CABG; COPD; Smoking; Prostate Cancer; HTN; TIA; Subclavian Steal Syndrome; Anixety/Depression; ADD; Carotid Stenosis; Malignant Liver Lesion; Statin Intolerance; and GERD. On 3/19 via MyChart messaged to inform that his TSH was 11.424. On 4/09 TSH was 39.78. On 01/24/2024 TSH was 10.46. Taking Levothyroxine  112 mcg daily. Notes that his thyroid  was partially removed during his laryngopharyngectomy.   Seen by Neurology on April 8th for his tremor and was started on Baclofen  5 mg twice daily and Mysoline  50 mg twice daily. Has not been taking the Baclofen , only the Mysoline . Now he is going to PT for gait instability. He says that it was noted that his eyes were misaligned, and he at times notices colors seem to be brighter than they were, though he denies blurriness.   Past Medical History:  Diagnosis Date   ADHD (attention deficit hyperactivity disorder)    Anxiety    Arthritis    "NECK, HANDS, KNEES" (06/14/2016)   Atypical mole 08/07/2021   Mid Back (Atypical Proliferation)   CAD (coronary artery disease)    Carotid artery stenosis    Complication of anesthesia    Reports that he had trouble swallowing post anesthesia- CABG   COPD (chronic obstructive pulmonary disease) (HCC) 12/11/2011    r/s mv - EF 72%; exercise capacity 13 METS; no exercised induced ischemic EKG changes   Depression, major    Dyspnea    with exertion   GERD (gastroesophageal reflux disease)    HTN (hypertension), benign 04/16/2008   echo - EF >55%; no regional wall or valvular abnormalities   Hyperlipidemia    statin intolerant   Hypothyroidism    Migraine    "crippling headaches as a kid; silent migraines now, no pain, couple times/week" (06/14/2016)   Peripheral vascular disease (HCC)    Prostate cancer (HCC) 04/2019   recent dx - Dr Kerby Pearson   S/P angioplasty with stent 06/14/16 PCI & DES of high grade AV groove LCX and 1st OM  06/15/2016   Squamous cell cancer of tongue (HCC) ~ 2002   "35 radiation treatments at [facility]"   Stroke [facility])    hx Thalamic Ischemic CVA   Subclavian steal syndrome 06/17/2012   carotid doppler - R systolic brachial pressure , L ; R subclavian artery - proxmial obsstruction w/ abnormal monophasic waveforms, R ECA known occlusive disease; L ECA narrowing w/ 70-99% diameter reductiona   TIA (transient ischemic attack) 2005   Viral hepatitis 1970s   "non specific"    Allergies  Allergen Reactions   Crestor [Rosuvastatin Calcium] Other (See Comments)    Difficulty swallowing   Tramadol  Shortness Of Breath   Lisinopril  Cough   Gabapentin  Other (See Comments)    Hallucinations   Tricor [Fenofibrate] Other (See Comments)    UNSPECIFIED REACTION   Lipitor [Atorvastatin Calcium] Other (See Comments)    Muscle spasms  Family History  Problem Relation Age of Onset   Cancer Mother        ovarian   Stroke Father    Diabetes Father    Arthritis Father    Hypertension Brother    Social History   Social History Narrative   Not on file   Review of Systems  Eyes:  Negative for blurred vision.       (+) Misalignment (+) Visual Change - occasional brightening   Musculoskeletal:        (+) Drifting while Walking  All other systems reviewed and  are negative.    Objective:  Vitals: BP 116/76   Temp 98.1 F (36.7 [name]) (Temporal)   Ht 5\' 7"  (1.702 m)   Wt 149 lb 6.4 oz (67.8 kg)   SpO2 97%   BMI 23.40 kg/m   Physical Exam Constitutional:      General: He is not in acute distress.    Appearance: Normal appearance. He is not ill-appearing.  HENT:     Head: Normocephalic and atraumatic.  Cardiovascular:     Rate and Rhythm: Normal rate and regular rhythm.     Pulses: Normal pulses.     Heart sounds: Normal heart sounds. No murmur heard.    No friction rub. No gallop.  Pulmonary:     Effort: Pulmonary effort is normal. No respiratory distress.     Breath sounds: Normal breath sounds. No wheezing or rales.  Skin:    General: Skin is warm and dry.  Neurological:     Mental Status: He is alert and oriented to person, place, and time. Mental status is at baseline.  Psychiatric:        Mood and Affect: Mood normal.        Behavior: Behavior normal.        Thought Content: Thought content normal.        Judgment: Judgment normal.     Results:  Studies Obtained And Personally Reviewed By Me: Labs:     Component Value Date/Time   NA 143 08/27/2023 1157   K 4.0 08/27/2023 1157   CL 101 08/27/2023 1157   CO2 27 08/27/2023 1157   GLUCOSE 96 08/27/2023 1157   BUN 24 (H) 08/27/2023 1157   CREATININE 1.41 (H) 08/27/2023 1157   CREATININE 1.14 06/11/2023 0945   CALCIUM 9.7 08/27/2023 1157   PROT 6.1 06/11/2023 0945   ALBUMIN  3.6 05/05/2021 0852   AST 14 06/11/2023 0945   ALT 18 06/11/2023 0945   ALKPHOS 57 05/05/2021 0852   BILITOT 0.3 06/11/2023 0945   GFRNONAA 53 (L) 08/27/2023 1157   GFRNONAA 69 03/30/2021 0950   GFRAA 80 03/30/2021 0950    Lab Results  Component Value Date   WBC 6.7 08/27/2023   HGB 17.1 (H) 08/27/2023   HCT 50.1 08/27/2023   MCV 92.4 08/27/2023   PLT 179 08/27/2023   Lab Results  Component Value Date   CHOL 120 06/11/2023   HDL 49 06/11/2023   LDLCALC 48 06/11/2023   TRIG 142  06/11/2023   CHOLHDL 2.4 06/11/2023   Lab Results  Component Value Date   HGBA1C 5.5 01/01/2017    Lab Results  Component Value Date   TSH 10.46 (H) 01/24/2024    Lab Results  Component Value Date   PSA <0.04 06/11/2023   PSA <0.04 04/27/2022   PSA <0.04 03/30/2021     Assessment & Plan:   Hypothyroidism: on 3/19 via MyChart messaged to inform that  his TSH was 11.424. On 4/09 TSH was 39.78. 01/24/2024 TSH was 10.46. Taking Levothyroxine  112 mcg daily. Denies any symptoms. Notes that his thyroid  was partially removed during his laryngopharyngectomy. Increasing Levothyroxine  to 125 mcg daily. Follow up in 6 weeks  Seen by Neurology on April 8th for his Essential Tremor and was started on Baclofen  5 mg twice daily and Mysoline  50 mg twice daily. Has not been taking the Baclofen , only the Mysoline . Now he is going to PT for Gait Instability. He says that it was noted that his eyes were misaligned, and he at times notices colors seem to be brighter than they were, though he denies blurriness.   Malignant Neoplasm Squamous Cell Carcinoma, Hypopharynx in 2023 seen at [facility]; S/p Laryngopharyngectomy and uses a voice device to communicate.  S/p Right Reverse Shoulder Arthroplasty 09/2023 by Dr. Winston Hawking   HLD; Statin Intolerance: managed with Repatha  biweekly  CAD; S/p CABG followed by Cardiology  COPD; Smoking  Hx of Prostate Cancer  HTN treated with Amlodipine  5 mg daily.  TIA: going to PT for gait instability possibly contributed by remote TIA  Subclavian Steal Syndrome  Anixety/Depression  Attention Deficit Disorder  Carotid Stenosis  Malignant Liver Lesion treated in 2023  GERD  Return in about 6 weeks (around 03/10/2024) for Lab Visit for TSH Recheck.    I,Emily Lagle,acting as a Neurosurgeon for Sylvan Evener, MD.,have documented all relevant documentation on the behalf of Sylvan Evener, MD,as directed by  Sylvan Evener, MD while in the presence of Sylvan Evener,  MD.   I, Sylvan Evener, MD, have reviewed all documentation for this visit. The documentation on 01/29/24 for the exam, diagnosis, procedures, and orders are all accurate and complete.

## 2024-01-27 ENCOUNTER — Other Ambulatory Visit (HOSPITAL_BASED_OUTPATIENT_CLINIC_OR_DEPARTMENT_OTHER): Payer: Self-pay

## 2024-01-27 MED ORDER — AMPHETAMINE-DEXTROAMPHETAMINE 20 MG PO TABS
20.0000 mg | ORAL_TABLET | Freq: Three times a day (TID) | ORAL | 0 refills | Status: AC
  Filled 2024-01-27: qty 270, 90d supply, fill #0

## 2024-01-28 ENCOUNTER — Other Ambulatory Visit (HOSPITAL_BASED_OUTPATIENT_CLINIC_OR_DEPARTMENT_OTHER): Payer: Self-pay

## 2024-01-28 ENCOUNTER — Ambulatory Visit (INDEPENDENT_AMBULATORY_CARE_PROVIDER_SITE_OTHER): Admitting: Internal Medicine

## 2024-01-28 VITALS — BP 116/76 | Temp 98.1°F | Ht 67.0 in | Wt 149.4 lb

## 2024-01-28 DIAGNOSIS — C131 Malignant neoplasm of aryepiglottic fold, hypopharyngeal aspect: Secondary | ICD-10-CM

## 2024-01-28 DIAGNOSIS — R7989 Other specified abnormal findings of blood chemistry: Secondary | ICD-10-CM

## 2024-01-28 DIAGNOSIS — E890 Postprocedural hypothyroidism: Secondary | ICD-10-CM

## 2024-01-28 MED ORDER — LEVOTHYROXINE SODIUM 125 MCG PO TABS
125.0000 ug | ORAL_TABLET | Freq: Every day | ORAL | 1 refills | Status: DC
  Filled 2024-01-28: qty 90, 90d supply, fill #0

## 2024-01-29 ENCOUNTER — Encounter: Payer: Self-pay | Admitting: Internal Medicine

## 2024-01-29 NOTE — Patient Instructions (Signed)
 Adjusting dose of thyroid  replacement medication to 125 mcg daily of levothyroxine .  Follow-up in 6 weeks.

## 2024-02-06 NOTE — Telephone Encounter (Signed)
 I'm not familiar with any medications that cause facial numbness.  Dexamethasone  can cause facial flushing and a pins/needles sensation.  Are the symptoms worse in the days after taking the steroid?

## 2024-02-06 NOTE — Telephone Encounter (Signed)
 Reviewing his med list, it could be a combination of things.  Metoprolol , baclofen  and sertraline  can all cause dizziness (up to 10% for each drug), and primidone  and dexamethasone  can both cause vertigo.   I would suspect it is more a combination of meds, than just 1, although the last one added could have been enough to tip the scales.  You could probably try diltiazem instead of metoprolol , but it has about the same incidence of dizziness.   And metoprolol  succ 25 mg daily is a low dose, so would be less suspect.  Any chance that the dizziness comes from the PVC's/bigeminy?

## 2024-02-12 ENCOUNTER — Other Ambulatory Visit (HOSPITAL_BASED_OUTPATIENT_CLINIC_OR_DEPARTMENT_OTHER): Payer: Self-pay

## 2024-02-12 MED ORDER — LIDOCAINE-PRILOCAINE 2.5-2.5 % EX CREA
TOPICAL_CREAM | CUTANEOUS | 1 refills | Status: AC
  Filled 2024-02-12: qty 30, 30d supply, fill #0

## 2024-02-21 ENCOUNTER — Other Ambulatory Visit (HOSPITAL_BASED_OUTPATIENT_CLINIC_OR_DEPARTMENT_OTHER): Payer: Self-pay

## 2024-02-21 ENCOUNTER — Other Ambulatory Visit: Payer: Self-pay

## 2024-02-25 ENCOUNTER — Other Ambulatory Visit (HOSPITAL_BASED_OUTPATIENT_CLINIC_OR_DEPARTMENT_OTHER): Payer: Self-pay

## 2024-03-05 ENCOUNTER — Encounter: Payer: Self-pay | Admitting: Nurse Practitioner

## 2024-03-05 ENCOUNTER — Ambulatory Visit

## 2024-03-05 ENCOUNTER — Ambulatory Visit: Attending: Nurse Practitioner | Admitting: Nurse Practitioner

## 2024-03-05 VITALS — BP 118/58 | HR 80 | Ht 67.0 in | Wt 149.0 lb

## 2024-03-05 DIAGNOSIS — C3490 Malignant neoplasm of unspecified part of unspecified bronchus or lung: Secondary | ICD-10-CM | POA: Insufficient documentation

## 2024-03-05 DIAGNOSIS — I498 Other specified cardiac arrhythmias: Secondary | ICD-10-CM | POA: Diagnosis present

## 2024-03-05 DIAGNOSIS — R42 Dizziness and giddiness: Secondary | ICD-10-CM | POA: Diagnosis not present

## 2024-03-05 DIAGNOSIS — I2510 Atherosclerotic heart disease of native coronary artery without angina pectoris: Secondary | ICD-10-CM | POA: Insufficient documentation

## 2024-03-05 DIAGNOSIS — I951 Orthostatic hypotension: Secondary | ICD-10-CM | POA: Insufficient documentation

## 2024-03-05 DIAGNOSIS — I6523 Occlusion and stenosis of bilateral carotid arteries: Secondary | ICD-10-CM | POA: Insufficient documentation

## 2024-03-05 DIAGNOSIS — E785 Hyperlipidemia, unspecified: Secondary | ICD-10-CM | POA: Insufficient documentation

## 2024-03-05 DIAGNOSIS — I10 Essential (primary) hypertension: Secondary | ICD-10-CM | POA: Insufficient documentation

## 2024-03-05 NOTE — Progress Notes (Unsigned)
Enrolled patient for a 14 day Zio XT monitor to be mailed to patients home   Gwenlyn Found to read

## 2024-03-05 NOTE — Progress Notes (Unsigned)
 Office Visit    Patient Name: [name] Date of Encounter: 03/05/2024  Primary Care Provider:  Sylvan Evener, MD Primary Cardiologist:  Lauro Portal, MD  Chief Complaint    73 year old male with a history of CAD s/p CABG x 5 (LIMA-LAD, SVG-D1, SVG-OM/OM2, SVG-PDA) in 2009, DES-LCx in 2017,frequent PVCs, hypertension, hyperlipidemia with statin intolerance, carotid artery disease, TIA, laryngectomy, thyroidectomy, prostate cancer and lung cancer who presents for follow-up related to PVCs and hypertension.   Past Medical History    Past Medical History:  Diagnosis Date   ADHD (attention deficit hyperactivity disorder)    Anxiety    Arthritis    "NECK, HANDS, KNEES" (06/14/2016)   Atypical mole 08/07/2021   Mid Back (Atypical Proliferation)   CAD (coronary artery disease)    Carotid artery stenosis    Complication of anesthesia    Reports that he had trouble swallowing post anesthesia- CABG   COPD (chronic obstructive pulmonary disease) (HCC) 12/11/2011   r/s mv - EF 72%; exercise capacity 13 METS; no exercised induced ischemic EKG changes   Depression, major    Dyspnea    with exertion   GERD (gastroesophageal reflux disease)    HTN (hypertension), benign 04/16/2008   echo - EF >55%; no regional wall or valvular abnormalities   Hyperlipidemia    statin intolerant   Hypothyroidism    Migraine    "crippling headaches as a kid; silent migraines now, no pain, couple times/week" (06/14/2016)   Peripheral vascular disease (HCC)    Prostate cancer (HCC) 04/2019   recent dx - Dr Kerby Pearson   S/P angioplasty with stent 06/14/16 PCI & DES of high grade AV groove LCX and 1st OM  06/15/2016   Squamous cell cancer of tongue (HCC) ~ 2002   "35 radiation treatments at [facility]"   Stroke [facility])    hx Thalamic Ischemic CVA   Subclavian steal syndrome 06/17/2012   carotid doppler - R systolic brachial pressure , L ; R subclavian artery - proxmial obsstruction w/  abnormal monophasic waveforms, R ECA known occlusive disease; L ECA narrowing w/ 70-99% diameter reductiona   TIA (transient ischemic attack) 2005   Viral hepatitis 1970s   "non specific"   Past Surgical History:  Procedure Laterality Date   BACK SURGERY     BIOPSY  02/20/2018   Procedure: BIOPSY;  Surgeon: Tami Falcon, MD;  Location: [facility];  Service: Endoscopy;;   BIOPSY TONGUE  ~ 2002   "base of tongue and uvula"   CARDIAC CATHETERIZATION  04/26/2008   L circumflex 99% stenosed midportion straddling a marginal branch; RCA total priximally w/ grade 2 L to R collaterals; renal arteries widely patent; LAD 30% segmental proximal stenosis   CARDIAC CATHETERIZATION N/A 05/14/2016   Procedure: Left Heart Cath and Cors/Grafts Angiography;  Surgeon: Avanell Leigh, MD;  Location: [facility];  Service: Cardiovascular;  Laterality: N/A;   CARDIAC CATHETERIZATION N/A 06/14/2016   Procedure: Coronary Stent Intervention;  Surgeon: Avanell Leigh, MD;  Location: [facility] INV[facility] CV [facility];  Service: Cardiovascular;  Laterality: N/A;   CERVICAL DISC SURGERY  2000   Kritzer    Posterior approach   COLONOSCOPY WITH PROPOFOL  N/A 02/20/2018   Procedure: COLONOSCOPY WITH PROPOFOL ;  Surgeon: Tami Falcon, MD;  Location: [facility];  Service: Endoscopy;  Laterality: N/A;   CORONARY ANGIOPLASTY WITH STENT PLACEMENT     CORONARY ARTERY BYPASS GRAFT  2009   x5; LIMA to LAd, sequential vein to  first diagonal branch, first and second obtuse marginal branches; vein to PDA   ESOPHAGOGASTRODUODENOSCOPY (EGD) WITH PROPOFOL  N/A 02/20/2018   Procedure: ESOPHAGOGASTRODUODENOSCOPY (EGD) WITH PROPOFOL ;  Surgeon: Tami Falcon, MD;  Location: [facility];  Service: Endoscopy;  Laterality: N/A;   INSERTION OF MESH N/A 10/29/2017   Procedure: INSERTION OF MESH;  Surgeon: Dareen Ebbing, MD;  Location: [facility] OR;  Service: General;  Laterality: N/A;   LUMBAR DISC SURGERY  06/17/2019   LUMBAR 2 & 3    LUMBAR  LAMINECTOMY/DECOMPRESSION MICRODISCECTOMY N/A 06/17/2019   Procedure: Lumbar two -Lumbar three decompression and disectomy;  Surgeon: Mort Ards, MD;  Location: [facility] OR;  Service: Orthopedics;  Laterality: N/A;  3 hrs   POLYPECTOMY  02/20/2018   Procedure: POLYPECTOMY;  Surgeon: Tami Falcon, MD;  Location: [facility];  Service: Endoscopy;;   REVERSE SHOULDER ARTHROPLASTY Right 09/06/2023   Procedure: REVERSE SHOULDER ARTHROPLASTY;  Surgeon: Winston Hawking, MD;  Location: [facility] ORS;  Service: Orthopedics;  Laterality: Right;  interscalene  100   SHOULDER ARTHROSCOPY WITH SUBACROMIAL DECOMPRESSION AND OPEN ROTATOR C Left 09/28/2013   Procedure: LEFT SHOULDER ARTHROSCOPY WITH SUBACROMIAL DECOMPRESSION AND MINI OPEN ROTATOR CUFF REPAIR, OPEN DISTAL CLAVICLE RESECTION AND OPEN BICEP TENDODESIS ;  Surgeon: Lorriane Rote, MD;  Location: [facility] OR;  Service: Orthopedics;  Laterality: Left;   TRANSCAROTID ARTERY REVASCULARIZATION  Right 05/05/2021   Procedure: RIGHT TRANSCAROTID ARTERY REVASCULARIZATION;  Surgeon: Carlene Che, MD;  Location: [facility] OR;  Service: Vascular;  Laterality: Right;   UMBILICAL HERNIA REPAIR N/A 10/29/2017   Procedure: UMBILICAL HERNIA REPAIR;  Surgeon: Dareen Ebbing, MD;  Location: [facility] OR;  Service: General;  Laterality: N/A;    Allergies  Allergies  Allergen Reactions   Crestor [Rosuvastatin Calcium] Other (See Comments)    Difficulty swallowing   Tramadol  Shortness Of Breath   Lisinopril  Cough   Gabapentin  Other (See Comments)    Hallucinations   Tricor [Fenofibrate] Other (See Comments)    UNSPECIFIED REACTION   Lipitor [Atorvastatin Calcium] Other (See Comments)    Muscle spasms     Labs/Other Studies Reviewed    The following studies were reviewed today:  Cardiac Studies & Procedures   ______________________________________________________________________________________________ CARDIAC CATHETERIZATION  CARDIAC CATHETERIZATION  06/14/2016  Conclusion Images from the original result were not included.   Mid Cx to Dist Cx lesion, 100 %stenosed.  SVG.  Origin lesion, 100 %stenosed.  Prox Cx to Mid Cx lesion, 80 %stenosed.  Post intervention, there is a 0% residual stenosis.  A stent was successfully placed.  Ost 2nd Mrg to 2nd Mrg lesion, 95 %stenosed.  Post intervention, there is a 0% residual stenosis.  A stent was successfully placed.  GERALDINE TESAR is a 73 y.o. male   [id number] LOCATION:  FACILITY: [facility] PHYSICIAN: Lauro Portal, M.D. 06/04/1951   DATE OF PROCEDURE:  06/14/2016  DATE OF DISCHARGE:     CARDIAC CATHETERIZATION Dorcas Galt    History obtained from chart review.The patient is a very pleasant 73 year old, thin-appearing, married Caucasian male, father of 1, grandfather to 4 grandchildren who I last saw 03/14/16. He has a history of coronary artery disease status post bypass grafting x5 by Dr. Roda Cirri June 16, 2008, with a free LIMA to his LAD, sequential vein to the first diagonal branch, first and second obtuse marginal branches, as well as a vein to the PDA. His other problems include hypertension and hyperlipidemia. He denies chest pain or shortness of breath. I did perform cerebral angiography on him May 05, 2008, because of a question of a high-grade internal carotid artery stenosis read by the radiologist. However, this turned out to be a left external carotid artery stenosis. He does have high-grade ostial left internal mammary artery stenosis which necessitated the use of a free LIMA. He is statin intolerant. 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 A Myoview  stress test performed December 11, 2011, was normal and carotid Dopplers did show a moderately severe right ICA stenosis which we have been following by duplex ultrasound. 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 Since I saw him 12 months ago he has noticed increasing dyspnea on exertion over the last several months, especially noticeable while mowing his lawn. I obtained a Myoview  stress test on him based on this 03/29/16 that showed a large reversible lateral wall perfusion defect  consistent with ischemia. He underwent outpatient cardiac catheterization by myself on 05/14/16 revealing severe native and graft vessel disease with normal LV function. Specifically, his RCA vein graft was occluded to an occluded dominant right and his circumflex vein graft which was sequential to 2 obtuse marginal branches was occluded at the aorta to patent continuation between OM1 and 2. He did have native circumflex disease. Dr. Luna Salinas saw him during his brief hospitalization to discuss the possibility of redo coronary artery bypass grafting although the consensus was that he would be better served with attempted PCI and stenting of his native circumflex coronary artery   Operators-Dr. Larinda Plover End, Dr. Lauro Portal  Impression Successful PCI and drug-eluting stenting of high-grade AV groove circumflex and first obtuse marginal branch stenosis in the setting of an occluded sequential vein graft with a patent continuation from OM1- OM 2 . The patient thought the procedure well. He will continue dual antiplatelet therapy. He'll be hydrated overnight and discharged home in the morning. I will see him back in the office in 2-3 weeks for follow-up.  Lauro Portal. MD, [facility] 06/14/2016 9:03 AM  Findings Coronary Findings Diagnostic  Dominance: Right  Left Circumflex  Second Obtuse Marginal Branch  Saphenous Graft To 3rd Mrg SVG.  Intervention  Prox Cx to Mid Cx lesion Angioplasty A stent was successfully placed. There is a 0% residual stenosis post intervention.  Ost 2nd Mrg to  2nd Mrg lesion Angioplasty A stent was successfully placed. There is a 0% residual stenosis post intervention.   CARDIAC CATHETERIZATION  CARDIAC CATHETERIZATION 05/14/2016  Conclusion Images from the original result were not included.   Ost RCA to Prox RCA lesion, 100 %stenosed.  Ost 1st Diag to 1st Diag lesion, 100 %stenosed.  Mid Cx to Dist Cx lesion, 100 %stenosed.  Ost 2nd Mrg to 2nd Mrg lesion, 90 %stenosed.  Prox Cx to Mid Cx lesion, 70 %stenosed.  Origin to Prox Graft lesion, 100 %stenosed.  Origin to Dist Graft lesion, 100 %stenosed.  LIMA.  The left ventricular systolic function is normal.  LV end diastolic pressure is normal.  The left ventricular ejection fraction is 55-65% by visual estimate.  [name] is a 73 y.o. male   [id number] LOCATION:  FACILITY: [facility] PHYSICIAN: Lauro Portal, M.D. 10/19/1950   DATE OF PROCEDURE:  05/14/2016  DATE OF DISCHARGE:     CARDIAC CATHETERIZATION    History obtained from chart review.The patient is a  very pleasant 73 year old, thin-appearing, married Caucasian male, father of 1, grandfather to 4 grandchildren who I last saw 03/14/16. He has a history of coronary artery disease status post bypass grafting x5 by Dr. Roda Cirri June 16, 2008, with a free LIMA to his LAD, sequential vein to the first diagonal branch, first and second obtuse marginal branches, as well as a vein to the PDA. His other problems include hypertension and hyperlipidemia. He denies chest pain or shortness of breath. I did perform cerebral angiography on him May 05, 2008, because of a question of a high-grade internal carotid artery stenosis read by the radiologist. However, this turned out to be a left external carotid artery stenosis. He does have high-grade ostial left internal mammary artery stenosis which necessitated the use of a free LIMA. He is statin intolerant. He has had squamous cell cancer at the base of his tongue  back in 2000 and radiation therapy to his head and neck. A Myoview  stress test performed December 11, 2011, was normal and carotid Dopplers did show a moderately severe right ICA stenosis which we have been following by duplex ultrasound. 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 Since I saw him 12 months ago he has noticed increasing dyspnea on exertion over the last several months, especially noticeable while mowing his lawn. I obtained a Myoview  stress test on him based on this 03/29/16 that showed a large reversible lateral wall perfusion defect  consistent with ischemia. He presents today for outpatient diagnostic coronary angiography to define his anatomy.  Impression Mr. Gavigan has an occluded Y graft to the diagonal branch, OM 2 and 3 with a patent sequential portion between the 2 marginal branches and high-grade AV groove circumflex and proximal marginal branch stenosis. His free LIMA is patent with LAD and his RCA is occluded as is the vein graft to the RCA with grade 3 left-to-right collaterals. He does have normal LV function. His Myoview  showed lateral ischemia. He has effort angina. I suspect this occurred because of the occlusion of McAlhaney  and Addie Holstein and have asked Dr. Luna Salinas to review the films and talk to the patient. His symptoms are exertional and stable. I'm going to discharge him home as an outpatient and will follow him up and make final disposition based on Dr. Audree Bless assessment.  Lauro Portal. MD, [facility] 05/14/2016 2:23 PM  Findings Coronary Findings Diagnostic  Dominance: Right  Left Anterior Descending  First Diagonal Branch  Left Circumflex  Second Obtuse Marginal Branch  Right Coronary Artery  Right Posterior Descending Artery Collaterals RPDA filled by collaterals from Mid LAD.  Graft To Ost  RPDA  Sequential Graft To 1st Mrg, 2nd Mrg  Sequential LIMA Graft To Lat 1st Diag, Dist LAD LIMA.  Intervention  No interventions have been documented.   STRESS TESTS  MYOCARDIAL PERFUSION IMAGING 03/29/2016  Narrative  Nuclear stress EF: 58%.  The left ventricular ejection fraction is normal (55-65%).  No T wave inversion was noted during stress.  There was no ST segment deviation noted during stress.  Defect 1: There is a large defect of moderate severity.  This is an intermediate risk study.  Defect 2: There is a medium defect of moderate severity present in the basal anterolateral, mid inferolateral, mid anterolateral and apical lateral location.  Findings consistent with ischemia.  Moderate size and intensity reversible lateral wall  perfusion defect suggestive of ischemia. There is also a large, moderate intensity fixed inferior wall defect with underlying bowel attenuation which is likely artifact. LVEF 58% with normal wall motion. This is an intermediate risk study.   ECHOCARDIOGRAM  ECHOCARDIOGRAM COMPLETE 01/22/2022  Narrative ECHOCARDIOGRAM REPORT    Patient Name:   [name] Date of Exam: 01/22/2022 Medical Rec #:  [id number]       Height:       70.0 in Accession #:    [id number]      Weight:       149.4 lb Date of Birth:  28-May-1951        BSA:          1.844 m Patient Age:    71 years        BP:           120/60 mmHg Patient Gender: M               HR:           58 bpm. Exam Location:  Outpatient  Procedure: 2D Echo, Color Doppler, Cardiac Doppler and Strain Analysis  Indications:    R06.9 DOE; R42 Lightheaded  History:        Patient has prior history of Echocardiogram examinations, most recent 03/19/2016. CAD, TIA, Signs/Symptoms:Dizziness/Lightheadedness; Risk Factors:Hypertension, Dyslipidemia and Former Smoker. Patient denies chest pain and leg edema. He does have some DOE occassionally. Patient had a 2 week episode of dizziness  after standing and it has since subsided. History of squamous cell cancer of the tongue and prostate cancer, both treated without chemotherapy.  Sonographer:    Richarda Chance RVT, RDCS (AE), RDMS Referring Phys: [phone] Karon Packer DICK   Sonographer Comments: Image acquisition challenging due to respiratory motion. IMPRESSIONS   1. Left ventricular ejection fraction, by estimation, is 60 to 65%. Left ventricular ejection fraction by 3D volume is 61 %. The left ventricle has normal function. The left ventricle has no regional wall motion abnormalities. There is mild concentric left ventricular hypertrophy. Left ventricular diastolic parameters are consistent with Grade II diastolic dysfunction (pseudonormalization). 2. Right ventricular systolic function is normal. The right ventricular size is normal. There is normal pulmonary artery systolic pressure. The estimated right ventricular systolic pressure is 25.3 mmHg. 3. Left atrial size was moderately dilated. 4. The mitral valve is abnormal. Mild mitral valve regurgitation. Moderate mitral annular calcification. 5. The aortic valve has an indeterminant number of cusps. There is mild calcification of the aortic valve. There is mild thickening of the aortic valve. Aortic valve regurgitation is mild. 6. Aortic dilatation noted. There is borderline dilatation of the aortic root, measuring 36 mm. 7. The inferior vena cava is normal in size with greater than 50% respiratory variability, suggesting right atrial pressure of 3 mmHg.  Comparison(s): Compared to prior TTE in 2017, there is no significant change.  FINDINGS Left Ventricle: Left ventricular ejection fraction, by estimation, is 60 to 65%. Left ventricular ejection fraction by 3D volume is 61 %. The left ventricle has normal function. The left ventricle has no regional wall motion abnormalities. The left ventricular internal cavity size was normal in size. There is mild concentric left  ventricular hypertrophy. Left ventricular diastolic parameters are consistent with Grade II diastolic dysfunction (pseudonormalization).  Right Ventricle: The right ventricular size is normal. No increase in right ventricular wall thickness. Right ventricular systolic function is normal. There is normal pulmonary artery systolic pressure. The tricuspid regurgitant velocity is 2.36  m/s, and with an assumed right atrial pressure of 3 mmHg, the estimated right ventricular systolic pressure is 25.3 mmHg.  Left Atrium: Left atrial size was moderately dilated.  Right Atrium: Right atrial size was normal in size.  Pericardium: There is no evidence of pericardial effusion.  Mitral Valve: The mitral valve is abnormal. There is mild thickening of the mitral valve leaflet(s). There is mild calcification of the mitral valve leaflet(s). Moderate mitral annular calcification. Mild mitral valve regurgitation.  Tricuspid Valve: The tricuspid valve is normal in structure. Tricuspid valve regurgitation is trivial.  Aortic Valve: The aortic valve has an indeterminant number of cusps. There is mild calcification of the aortic valve. There is mild thickening of the aortic valve. Aortic valve regurgitation is mild. Aortic regurgitation PHT measures 531 msec. Aortic valve sclerosis/calcification is present, without any evidence of aortic stenosis. Aortic valve mean gradient measures 5.0 mmHg. Aortic valve peak gradient measures 9.1 mmHg. Aortic valve area, by VTI measures 1.88 cm.  Pulmonic Valve: The pulmonic valve was normal in structure. Pulmonic valve regurgitation is trivial.  Aorta: Aortic dilatation noted. There is borderline dilatation of the aortic root, measuring 36 mm.  Venous: The inferior vena cava is normal in size with greater than 50% respiratory variability, suggesting right atrial pressure of 3 mmHg.  IAS/Shunts: The atrial septum is grossly normal.   LEFT VENTRICLE PLAX 2D LVIDd:          4.42 cm         Diastology LVIDs:         2.81 cm         LV e' medial:    5.12 cm/s LV PW:         1.11 cm         LV E/e' medial:  21.1 LV IVS:        1.02 cm         LV e' lateral:   8.95 cm/s LVOT diam:     2.10 cm         LV E/e' lateral: 12.1 LV SV:         63 LV SV Index:   34              2D LVOT Area:     3.46 cm        Longitudinal Strain 2D Strain GLS  -15.9 % LV Volumes (MOD)               Avg: LV vol d, MOD    86.9 ml A2C:                           3D Volume EF LV vol d, MOD    75.0 ml       LV 3D EF:    Left A4C:                                        ventricul LV vol s, MOD    32.9 ml                    ar A2C:                                        ejection  LV vol s, MOD    31.0 ml                    fraction A4C:                                        by 3D LV SV MOD A2C:   54.0 ml                    volume is LV SV MOD A4C:   75.0 ml                    61 %. LV SV MOD BP:    48.8 ml  3D Volume EF: 3D EF:        61 % LV EDV:       123 ml LV ESV:       48 ml LV SV:        74 ml  RIGHT VENTRICLE RV S prime:     8.55 cm/s TAPSE (M-mode): 1.3 cm  LEFT ATRIUM             Index        RIGHT ATRIUM           Index LA diam:        4.40 cm 2.39 cm/m   RA Area:     18.30 cm LA Vol (A2C):   97.1 ml 52.66 ml/m  RA Volume:   48.00 ml  26.03 ml/m LA Vol (A4C):   80.5 ml 43.66 ml/m LA Biplane Vol: 87.9 ml 47.67 ml/m AORTIC VALVE                     PULMONIC VALVE AV Area (Vmax):    2.00 cm      PV Vmax:          0.94 m/s AV Area (Vmean):   1.67 cm      PV Peak grad:     3.5 mmHg AV Area (VTI):     1.88 cm      PR End Diast Vel: 0.81 msec AV Vmax:           151.00 cm/s AV Vmean:          103.000 cm/s AV VTI:            0.337 m AV Peak Grad:      9.1 mmHg AV Mean Grad:      5.0 mmHg LVOT Vmax:         87.00 cm/s LVOT Vmean:        49.700 cm/s LVOT VTI:          0.183 m LVOT/AV VTI ratio: 0.54 AI PHT:            531 msec AR Vena Contracta: 0.16  cm  AORTA Ao Root diam: 3.60 cm Ao Asc diam:  3.10 cm Ao Arch diam: 3.1 cm  MITRAL VALVE                TRICUSPID VALVE MV Area (PHT): 3.76 cm     TR Peak grad:   22.3 mmHg MV Decel Time: 202 msec     TR Vmax:        236.00 cm/s MV E velocity: 108.00 cm/s MV A velocity: 57.60 cm/s   SHUNTS MV E/A ratio:  1.88  Systemic VTI:  0.18 m Systemic Diam: 2.10 cm  Riccardo Chamberlain MD Electronically signed by Riccardo Chamberlain MD Signature Date/Time: 01/22/2022/3:30:55 PM    Final          ______________________________________________________________________________________________     Recent Labs: 06/11/2023: ALT 18 08/27/2023: BUN 24; Creatinine, Ser 1.41; Hemoglobin 17.1; Platelets 179; Potassium 4.0; Sodium 143 01/24/2024: TSH 10.46  Recent Lipid Panel    Component Value Date/Time   CHOL 120 06/11/2023 0945   CHOL 164 07/03/2018 1241   TRIG 142 06/11/2023 0945   HDL 49 06/11/2023 0945   HDL 93 07/03/2018 1241   CHOLHDL 2.4 06/11/2023 0945   VLDL 22 05/06/2021 0432   LDLCALC 48 06/11/2023 0945    History of Present Illness    73 year old male with the above past medical history including CAD s/p CABG x 5 (LIMA-LAD, SVG-D1, SVG-OM/OM2, SVG-PDA) in 2009, DES-LCx in 2017,frequent PVCs, hypertension, hyperlipidemia with statin intolerance, carotid artery disease, TIA, laryngectomy, thyroidectomy, prostate cancer and lung cancer.  Myoview  in 2013 was low risk.  Repeat Myoview  in June 2017 showed large reversible lateral perfusion defect consistent with ischemia.  Follow-up cardiac catheterization in 05/2016 showed severe native and graft disease, occluded RCA graft and OM graft, significant native left circumflex disease.  He underwent DES-LCx into OM branch in 06/2016. He underwent right carotid artery stenting per vascular surgery in July 2022 in the setting of severe carotid artery stenosis.  Most recent carotid ultrasound in 03/2023 revealed 50 to 70% R ICA stenosis, 40  to 59% LICA stenosis.  He has a history of statin intolerance, on Praluent .  Additionally, he has a history of orthostatic hypotension with intermittent dizziness, labile BP and has followed with the hypertension clinic pharmD. He underwent liver ablation of 2 lesions in August 2024.  PET/CT also showed solid pulmonary nodule measuring 5 mm in the right lower lobe.  He is following with oncology.  Follow-up appointment in 06/2023 he noted ongoing labile BP, intermittent lightheadedness, occasional palpitations.  Metoprolol  tartrate was transitioned to metoprolol  succinate 12.5 mg daily.  He was last seen in the office on 12/16/2023 who was stable from a cardiac standpoint.  He reported mild dyspnea, unchanged from prior visits.   He presents today for follow-up accompanied by his wife.  Since his last visit he has noted increased dizziness with position changes.  He denies any syncope, denies palpitations, he has had some shortness of breath with activity.  He denies chest pain, edema, PND, orthopnea, weight gain.  He is currently undergoing chemotherapy for lung/liver cancer.  He notes he was evaluated by ENT for vestibular dysfunction, workup was unremarkable.  He additionally saw neurologist, workup was unremarkable.  He saw his oncologist and was positive for orthostatic hypotension.  HR has remained stable, BP has been in the 120s to 150s, he is symptomatic with greater than 20 point drop in SBP.  Question chronotropic incompetence.  He reports he did have an ETT at 1 point in the past and they were unable to achieve target heart rate.  We discussed management supportive therapy for orthostatic hypotension.  Will update echocardiogram.  Will check 14-day ZIO monitor.  He is pending repeat carotid ultrasound.  He notes increased dizziness when he looks up.  He does also have a history of cervical spine stenosis, he questions whether or not this could be contributing to his symptoms.  Generally euvolemic,  compensated on exam.  However, given chemotherapy, will update echocardiogram.  Follow-up in 2 months.  1. CAD:  S/p CABG x 5 (LIMA-LAD, SVG-D1, SVG-OM/OM2, SVG-PDA) in 2009, DES-LCx in 2017. Stable with no anginal symptoms. No indication for ischemic evaluation.  Continue aspirin , metoprolol  as below, Repatha .   2. Hypertension: History of labile BP/orthostatic hypotension.  He has followed up with the hypertension clinic Pharm.D.  He did not tolerate low-dose amlodipine .  He is currently tolerating metoprolol  succinate.  He self-titrated his dose to 25 mg daily in the setting of frequent PVCs.  BP has been stable, PVCs have improved as below.  Will increase metoprolol  succinate to 25 mg daily.  Continue to monitor BP and report SBP consistently less than 100, or SBP consistently greater than 150.   3. Bigeminy: Improved with metoprolol  as above. Continue to monitor BP/HR, continue to monitor symptoms. Continue metoprolol  as above.    4. Hyperlipidemia: LDL was 48 in 06/2023. Monitored and managed per PCP.  Continue Repatha .   5. Carotid artery disease: S/p R carotid artery stenting in 2022. Most recent carotid ultrasound in 03/2023 revealed 50 to 75% R ICA stenosis, 40 to 59% LICA stenosis. Follows with vascular surgery.    6. Lung cancer: Following with oncology, CT surgery.   7. Disposition: Follow-up as  Home Medications    Current Outpatient Medications  Medication Sig Dispense Refill   acetaminophen  (TYLENOL ) 500 MG tablet Take 500 mg by mouth every 6 (six) hours as needed for mild pain (pain score 1-3) or moderate pain (pain score 4-6).     albuterol  (VENTOLIN  HFA) 108 (90 Base) MCG/ACT inhaler Inhale 2 puffs into the lungs every 6 (six) hours as needed for wheezing or shortness of breath. 18 g 5   amphetamine -dextroamphetamine  (ADDERALL) 20 MG tablet Take 1 tablet (20 mg total) by mouth in the morning, at noon, and at bedtime. 270 tablet 0   amphetamine -dextroamphetamine  (ADDERALL) 20  MG tablet Take 1 tablet (20 mg total) by mouth in the morning, at noon, and at 4pm. (Patient not taking: Reported on 01/28/2024) 270 tablet 0   amphetamine -dextroamphetamine  (ADDERALL) 20 MG tablet Take 1 tablet (20 mg total) by mouth 3 (three) times daily. Take in the morning, at noon and at 4pm. (Patient not taking: Reported on 01/28/2024) 270 tablet 0   aspirin  EC 81 MG tablet Take 81 mg by mouth daily. Swallow whole.     Baclofen  5 MG TABS Take 1 tablet (5 mg total) by mouth 2 (two) times a day Indications: muscle tension. 60 tablet 5   buPROPion  (WELLBUTRIN  XL) 300 MG 24 hr tablet Take 1 tablet (300 mg total) by mouth every morning. 90 tablet 3   clotrimazole  (LOTRIMIN  AF) 1 % cream Apply 1 Application topically 2 (two) times daily. (Patient taking differently: Apply 1 Application topically daily as needed (Rash).) 28 g 0   Cyanocobalamin 2500 MCG SUBL Place 2,500 mcg under the tongue once a week.     dexamethasone  (DECADRON ) 4 MG tablet Take 1 tablet (4 mg total) by mouth in the morning and 1 tablet (4 mg total) in the evening. Take with meals. Take prescribed dose on the 3 days AFTER chemotherapy infusion treatment. 36 tablet 1   Evolocumab  (REPATHA  SURECLICK) 140 MG/ML SOAJ Inject 140 mg into the skin every 14 (fourteen) days. 6 mL 3   levothyroxine  (SYNTHROID ) 125 MCG tablet Take 1 tablet (125 mcg total) by mouth daily. 90 tablet 1   lidocaine -prilocaine  (EMLA ) cream Apply topically as needed (apply to port site). 30 g 1   LORazepam  (ATIVAN ) 1 MG  tablet Take 1 tablet (1 mg total) by mouth 2 to 3 times a day as needed. ( no more than 2.5 per day) (Patient taking differently: Take 1 mg by mouth at bedtime.) 225 tablet 0   metoprolol  succinate (TOPROL  XL) 25 MG 24 hr tablet Take 1 tablet (25 mg total) by mouth daily. 90 tablet 3   nystatin  (MYCOSTATIN ) 100000 UNIT/ML suspension Take 5 mLs (500,000 Units total) by mouth 4 (four) times daily for 10 days. 200 mL 0   ondansetron  (ZOFRAN ) 8 MG tablet  Take 1 tablet (8 mg total) by mouth every 8 (eight) hours as needed for nausea or vomiting. (Patient not taking: Reported on 01/28/2024) 30 tablet 3   ondansetron  (ZOFRAN -ODT) 4 MG disintegrating tablet Take 4 mg by mouth every 8 (eight) hours as needed.     pantoprazole  (PROTONIX ) 40 MG tablet Take 1 tablet (40 mg total) by mouth daily. 90 tablet 3   primidone  (MYSOLINE ) 50 MG tablet Take 1 tablet (50 mg total) by mouth 2 (two) times a day Indications: essential tremor. (Patient not taking: Reported on 01/28/2024) 60 tablet 5   prochlorperazine  (COMPAZINE ) 10 MG tablet Take 1 tablet (10 mg total) by mouth every 6 (six) hours as needed for nausea or vomiting. 30 tablet 3   sertraline  (ZOLOFT ) 100 MG tablet Take 1 tablet (100 mg total) by mouth daily. 90 tablet 4   Current Facility-Administered Medications  Medication Dose Route Frequency Provider Last Rate Last Admin   ticagrelor  (BRILINTA ) tablet 90 mg  90 mg Oral BID Carlene Che, MD         Review of Systems    ***.  All other systems reviewed and are otherwise negative except as noted above.    Physical Exam    VS:  There were no vitals taken for this visit. , BMI There is no height or weight on file to calculate BMI.     GEN: Well nourished, well developed, in no acute distress. HEENT: normal. Neck: Supple, no JVD, carotid bruits, or masses. Cardiac: RRR, no murmurs, rubs, or gallops. No clubbing, cyanosis, edema.  Radials/DP/PT 2+ and equal bilaterally.  Respiratory:  Respirations regular and unlabored, clear to auscultation bilaterally. GI: Soft, nontender, nondistended, BS + x 4. MS: no deformity or atrophy. Skin: warm and dry, no rash. Neuro:  Strength and sensation are intact. Psych: Normal affect.  Accessory Clinical Findings    ECG personally reviewed by me today -    - no acute changes.   Lab Results  Component Value Date   WBC 6.7 08/27/2023   HGB 17.1 (H) 08/27/2023   HCT 50.1 08/27/2023   MCV 92.4 08/27/2023    PLT 179 08/27/2023   Lab Results  Component Value Date   CREATININE 1.41 (H) 08/27/2023   BUN 24 (H) 08/27/2023   NA 143 08/27/2023   K 4.0 08/27/2023   CL 101 08/27/2023   CO2 27 08/27/2023   Lab Results  Component Value Date   ALT 18 06/11/2023   AST 14 06/11/2023   ALKPHOS 57 05/05/2021   BILITOT 0.3 06/11/2023   Lab Results  Component Value Date   CHOL 120 06/11/2023   HDL 49 06/11/2023   LDLCALC 48 06/11/2023   TRIG 142 06/11/2023   CHOLHDL 2.4 06/11/2023    Lab Results  Component Value Date   HGBA1C 5.5 01/01/2017    Assessment & Plan    1.  ***  No BP recorded.  {Refresh Note OR  Click here to enter BP  :1}***   Jude Norton, NP 03/05/2024, 6:21 AM

## 2024-03-05 NOTE — Patient Instructions (Signed)
 Medication Instructions:  STOP METOPROL *If you need a refill on your cardiac medications before your next appointment, please call your pharmacy*   Testing/Procedures: ZIO XT- Long Term Monitor Instructions  Your physician has requested you wear a ZIO patch monitor for 14 days.  This is a single patch monitor. [employer] supplies one patch monitor per enrollment. Additional stickers are not available. Please do not apply patch if you will be having a Nuclear Stress Test,  Echocardiogram, Cardiac CT, MRI, or Chest Xray during the period you would be wearing the  monitor. The patch cannot be worn during these tests. You cannot remove and re-apply the  ZIO XT patch monitor.  Your ZIO patch monitor will be mailed 3 day USPS to your address on file. It may take 3-5 days  to receive your monitor after you have been enrolled.  Once you have received your monitor, please review the enclosed instructions. Your monitor  has already been registered assigning a specific monitor serial # to you.  Billing and Patient Assistance Program Information  We have supplied [employer] with any of your insurance information on file for billing purposes. [employer] offers a sliding scale Patient Assistance Program for patients that do not have  insurance, or whose insurance does not completely cover the cost of the ZIO monitor.  You must apply for the Patient Assistance Program to qualify for this discounted rate.  To apply, please call [employer] at [phone], select option 4, select option 2, ask to apply for  Patient Assistance Program. Sanna Crystal will ask your household income, and how many people  are in your household. They will quote your out-of-pocket cost based on that information.  [employer] will also be able to set up a 81-month, interest-free payment plan if needed.  Applying the monitor   Shave hair from upper left chest.  Hold abrader disc by orange tab. Rub abrader in 40 strokes over the upper left chest  as  indicated in your monitor instructions.  Clean area with 4 enclosed alcohol pads. Let dry.  Apply patch as indicated in monitor instructions. Patch will be placed under collarbone on left  side of chest with arrow pointing upward.  Rub patch adhesive wings for 2 minutes. Remove white label marked "1". Remove the white  label marked "2". Rub patch adhesive wings for 2 additional minutes.  While looking in a mirror, press and release button in center of patch. A small green light will  flash 3-4 times. This will be your only indicator that the monitor has been turned on.  Do not shower for the first 24 hours. You may shower after the first 24 hours.  Press the button if you feel a symptom. You will hear a small click. Record Date, Time and  Symptom in the Patient Logbook.  When you are ready to remove the patch, follow instructions on the last 2 pages of Patient  Logbook. Stick patch monitor onto the last page of Patient Logbook.  Place Patient Logbook in the blue and white box. Use locking tab on box and tape box closed  securely. The blue and white box has prepaid postage on it. Please place it in the mailbox as  soon as possible. Your physician should have your test results approximately 7 days after the  monitor has been mailed back to [facility].  Call [facility] Customer Care at [phone] if you have questions regarding  your ZIO XT patch monitor. Call them immediately if you see an orange light  blinking on your  monitor.  If your monitor falls off in less than 4 days, contact our Monitor department at [phone].  If your monitor becomes loose or falls off after 4 days call [employer] at [phone] for  suggestions on securing your monitor  Your physician has requested that you have an echocardiogram. Echocardiography is a painless test that uses sound waves to create images of your heart. It provides your doctor with information about the size and shape of your heart  and how well your heart's chambers and valves are working. This procedure takes approximately one hour. There are no restrictions for this procedure. Please do NOT wear cologne, perfume, aftershave, or lotions (deodorant is allowed). Please arrive 15 minutes prior to your appointment time.  Please note: We ask at that you not bring children with you during ultrasound (echo/ vascular) testing. Due to room size and safety concerns, children are not allowed in the ultrasound rooms during exams. Our front office staff cannot provide observation of children in our lobby area while testing is being conducted. An adult accompanying a patient to their appointment will only be allowed in the ultrasound room at the discretion of the ultrasound technician under special circumstances. We apologize for any inconvenience.   Follow-Up: At [facility], you and your health needs are our priority.  As part of our continuing mission to provide you with exceptional heart care, our providers are all part of one team.  This team includes your primary Cardiologist (physician) and Advanced Practice Providers or APPs (Physician Assistants and Nurse Practitioners) who all work together to provide you with the care you need, when you need it.  Your next appointment:   2 month(s)  Provider:   Lauro Portal, MD or Marlana Silvan, NP   We recommend signing up for the patient portal called "MyChart".  Sign up information is provided on this After Visit Summary.  MyChart is used to connect with patients for Virtual Visits (Telemedicine).  Patients are able to view lab/test results, encounter notes, upcoming appointments, etc.  Non-urgent messages can be sent to your provider as well.   To learn more about what you can do with MyChart, go to [email or web address].   Other Instructions Orthostatic Hypotension Blood pressure is a measurement of how strongly, or weakly, your circulating blood is pressing against the walls  of your arteries. Orthostatic hypotension is a drop in blood pressure that can happen when you change positions, such as when you go from lying down to standing. Arteries are blood vessels that carry blood from your heart throughout your body. When blood pressure is too low, you may not get enough blood to your brain or to the rest of your organs. Orthostatic hypotension can cause light-headedness, sweating, rapid heartbeat, blurred vision, and fainting. These symptoms require further investigation into the cause. What are the causes? Orthostatic hypotension can be caused by many things, including: Sudden changes in posture, such as standing up quickly after you have been sitting or lying down. Loss of blood (anemia) or loss of body fluids (dehydration). Heart problems, neurologic problems, or hormone problems. Pregnancy. Aging. The risk for this condition increases as you get older. Severe infection (sepsis). Certain medicines, such as medicines for high blood pressure or medicines that make the body lose excess fluids (diuretics). What are the signs or symptoms? Symptoms of this condition may include: Weakness, light-headedness, or dizziness. Sweating. Blurred vision. Tiredness (fatigue). Rapid heartbeat. Fainting, in severe cases. How is this diagnosed? This condition  is diagnosed based on: Your symptoms and medical history. Your blood pressure measurements. Your health care provider will check your blood pressure when you are: Lying down. Sitting. Standing. A blood pressure reading is recorded as two numbers, such as "120 over 80" (or 120/80). The first ("top") number is called the systolic pressure. It is a measure of the pressure in your arteries as your heart beats. The second ("bottom") number is called the diastolic pressure. It is a measure of the pressure in your arteries when your heart relaxes between beats. Blood pressure is measured in a unit called mmHg. Healthy blood  pressure for most adults is 120/80 mmHg. Orthostatic hypotension is defined as a 20 mmHg drop in systolic pressure or a 10 mmHg drop in diastolic pressure within 3 minutes of standing. Other information or tests that may be used to diagnose orthostatic hypotension include: Your other vital signs, such as your heart rate and temperature. Blood tests. An electrocardiogram (ECG) or echocardiogram. A Holter monitor. This is a device you wear that records your heart rhythm continuously, usually for 24-48 hours. Tilt table test. For this test, you will be safely secured to a table that moves you from a lying position to an upright position. Your heart rhythm and blood pressure will be monitored during the test. How is this treated? This condition may be treated by: Changing your diet. This may involve eating more salt (sodium) or drinking more water . Changing the dosage of certain medicines you are taking that might be lowering your blood pressure. Correcting the underlying reason for the orthostatic hypotension. Wearing compression stockings. Taking medicines to raise your blood pressure. Avoiding actions that trigger symptoms. Follow these instructions at home: Medicines Take over-the-counter and prescription medicines only as told by your health care provider. Follow instructions from your health care provider about changing the dosage of your current medicines, if this applies. Do not stop or adjust any of your medicines on your own. Eating and drinking  Drink enough fluid to keep your urine pale yellow. Eat extra salt only as directed. Do not add extra salt to your diet unless advised by your health care provider. Eat frequent, small meals. Avoid standing up suddenly after eating. General instructions  Get up slowly from lying down or sitting positions. This gives your blood pressure a chance to adjust. Avoid hot showers and excessive heat as directed by your health care provider. Engage  in regular physical activity as directed by your health care provider. If you have compression stockings, wear them as told. Keep all follow-up visits. This is important. Contact a health care provider if: You have a fever for more than 2-3 days. You feel more thirsty than usual. You feel dizzy or weak. Get help right away if: You have chest pain. You have a fast or irregular heartbeat. You become sweaty or feel light-headed. You feel short of breath. You faint. You have any symptoms of a stroke. "BE FAST" is an easy way to remember the main warning signs of a stroke: B - Balance. Signs are dizziness, sudden trouble walking, or loss of balance. E - Eyes. Signs are trouble seeing or a sudden change in vision. F - Face. Signs are sudden weakness or numbness of the face, or the face or eyelid drooping on one side. A - Arms. Signs are weakness or numbness in an arm. This happens suddenly and usually on one side of the body. S - Speech. Signs are sudden trouble speaking, slurred speech, or  trouble understanding what people say. T - Time. Time to call emergency services. Write down what time symptoms started. You have other signs of a stroke, such as: A sudden, severe headache with no known cause. Nausea or vomiting. Seizure. These symptoms may represent a serious problem that is an emergency. Do not wait to see if the symptoms will go away. Get medical help right away. Call your local emergency services (911 in the [place] Do not drive yourself to the hospital. Summary Orthostatic hypotension is a sudden drop in blood pressure. It can cause light-headedness, sweating, rapid heartbeat, blurred vision, and fainting. Orthostatic hypotension can be diagnosed by having your blood pressure taken while lying down, sitting, and then standing. Treatment may involve changing your diet, wearing compression stockings, sitting up slowly, adjusting your medicines, or correcting the underlying reason for the  orthostatic hypotension. Get help right away if you have chest pain, a fast or irregular heartbeat, or symptoms of a stroke. This information is not intended to replace advice given to you by your health care provider. Make sure you discuss any questions you have with your health care provider. Document Revised: 12/01/2020 Document Reviewed: 12/01/2020 Elsevier Patient Education  2024 [employer].

## 2024-03-06 ENCOUNTER — Encounter: Payer: Self-pay | Admitting: Nurse Practitioner

## 2024-03-08 ENCOUNTER — Other Ambulatory Visit (HOSPITAL_BASED_OUTPATIENT_CLINIC_OR_DEPARTMENT_OTHER): Payer: Self-pay

## 2024-03-09 ENCOUNTER — Other Ambulatory Visit (HOSPITAL_BASED_OUTPATIENT_CLINIC_OR_DEPARTMENT_OTHER): Payer: Self-pay

## 2024-03-10 ENCOUNTER — Other Ambulatory Visit (HOSPITAL_BASED_OUTPATIENT_CLINIC_OR_DEPARTMENT_OTHER): Payer: Self-pay

## 2024-03-10 ENCOUNTER — Other Ambulatory Visit

## 2024-03-10 DIAGNOSIS — E890 Postprocedural hypothyroidism: Secondary | ICD-10-CM

## 2024-03-10 DIAGNOSIS — R7989 Other specified abnormal findings of blood chemistry: Secondary | ICD-10-CM

## 2024-03-10 DIAGNOSIS — E039 Hypothyroidism, unspecified: Secondary | ICD-10-CM

## 2024-03-10 LAB — TSH: TSH: 4.21 m[IU]/L (ref 0.40–4.50)

## 2024-03-10 LAB — T4, FREE: Free T4: 1.3 ng/dL (ref 0.8–1.8)

## 2024-03-11 ENCOUNTER — Ambulatory Visit: Payer: Self-pay | Admitting: Internal Medicine

## 2024-03-11 ENCOUNTER — Encounter: Payer: Self-pay | Admitting: Internal Medicine

## 2024-03-11 ENCOUNTER — Other Ambulatory Visit (HOSPITAL_BASED_OUTPATIENT_CLINIC_OR_DEPARTMENT_OTHER): Payer: Self-pay

## 2024-03-11 ENCOUNTER — Telehealth: Payer: Self-pay | Admitting: Internal Medicine

## 2024-03-11 DIAGNOSIS — E890 Postprocedural hypothyroidism: Secondary | ICD-10-CM

## 2024-03-11 DIAGNOSIS — R7989 Other specified abnormal findings of blood chemistry: Secondary | ICD-10-CM

## 2024-03-11 MED ORDER — LEVOTHYROXINE SODIUM 150 MCG PO TABS
150.0000 ug | ORAL_TABLET | Freq: Every day | ORAL | 0 refills | Status: DC
  Filled 2024-03-11: qty 90, 90d supply, fill #0

## 2024-03-11 NOTE — Telephone Encounter (Signed)
 Patient had TSH drawn yesterday on Levothyroxine  125 mcg daily. Result is 4.21 improved from 10.46. I would like to see TSH around 1.00 ideally. I will increase dose to 150 mcg daily and he will return in 6 weeks for TSH only. He is not to take med prior to blood draw. Spoke with his wife Ottie Blonder today and explained results and plan. Patient has gone on an arrand this morning and she will relay this message to him. Sending in 90 days Levothyroxine  150 mcg daily. See for lab only in 6 weeks. MJB, MD

## 2024-03-16 ENCOUNTER — Ambulatory Visit: Payer: Self-pay | Admitting: Cardiovascular Disease

## 2024-03-16 ENCOUNTER — Ambulatory Visit (HOSPITAL_COMMUNITY)
Admission: RE | Admit: 2024-03-16 | Discharge: 2024-03-16 | Disposition: A | Discharge status: Home / Self Care | Source: Ambulatory Visit | Attending: Cardiovascular Disease | Admitting: Cardiovascular Disease

## 2024-03-16 DIAGNOSIS — I6523 Occlusion and stenosis of bilateral carotid arteries: Secondary | ICD-10-CM | POA: Diagnosis not present

## 2024-03-17 ENCOUNTER — Other Ambulatory Visit (HOSPITAL_BASED_OUTPATIENT_CLINIC_OR_DEPARTMENT_OTHER): Payer: Self-pay

## 2024-03-19 ENCOUNTER — Encounter (HOSPITAL_COMMUNITY)

## 2024-03-24 ENCOUNTER — Other Ambulatory Visit (HOSPITAL_BASED_OUTPATIENT_CLINIC_OR_DEPARTMENT_OTHER): Payer: Self-pay

## 2024-03-24 MED ORDER — SERTRALINE HCL 100 MG PO TABS
200.0000 mg | ORAL_TABLET | Freq: Every day | ORAL | 3 refills | Status: AC
  Filled 2024-03-24: qty 180, 90d supply, fill #0
  Filled 2024-06-30: qty 180, 90d supply, fill #1

## 2024-03-24 MED ORDER — BUPROPION HCL ER (XL) 300 MG PO TB24
300.0000 mg | ORAL_TABLET | Freq: Every morning | ORAL | 3 refills | Status: AC
  Filled 2024-03-24 – 2024-06-11 (×2): qty 90, 90d supply, fill #0

## 2024-03-25 ENCOUNTER — Other Ambulatory Visit: Payer: Self-pay

## 2024-03-25 ENCOUNTER — Ambulatory Visit: Admitting: Neurology

## 2024-03-27 ENCOUNTER — Other Ambulatory Visit (HOSPITAL_BASED_OUTPATIENT_CLINIC_OR_DEPARTMENT_OTHER): Payer: Self-pay

## 2024-03-27 MED ORDER — ALBUTEROL SULFATE HFA 108 (90 BASE) MCG/ACT IN AERS
2.0000 | INHALATION_SPRAY | Freq: Four times a day (QID) | RESPIRATORY_TRACT | 3 refills | Status: AC | PRN
  Filled 2024-03-27: qty 6.7, 25d supply, fill #0
  Filled 2024-10-15: qty 6.7, 25d supply, fill #1

## 2024-04-02 ENCOUNTER — Other Ambulatory Visit (HOSPITAL_BASED_OUTPATIENT_CLINIC_OR_DEPARTMENT_OTHER): Payer: Self-pay

## 2024-04-02 DIAGNOSIS — I498 Other specified cardiac arrhythmias: Secondary | ICD-10-CM | POA: Diagnosis not present

## 2024-04-02 DIAGNOSIS — R42 Dizziness and giddiness: Secondary | ICD-10-CM

## 2024-04-02 MED ORDER — OXYCODONE HCL 5 MG PO TABS
5.0000 mg | ORAL_TABLET | ORAL | 0 refills | Status: DC | PRN
  Filled 2024-04-02: qty 42, 7d supply, fill #0

## 2024-04-06 ENCOUNTER — Ambulatory Visit: Payer: Self-pay | Admitting: Nurse Practitioner

## 2024-04-07 ENCOUNTER — Other Ambulatory Visit (HOSPITAL_BASED_OUTPATIENT_CLINIC_OR_DEPARTMENT_OTHER): Payer: Self-pay

## 2024-04-07 MED ORDER — FLUCONAZOLE 150 MG PO TABS
150.0000 mg | ORAL_TABLET | Freq: Every day | ORAL | 0 refills | Status: AC
  Filled 2024-04-07: qty 14, 14d supply, fill #0

## 2024-04-21 ENCOUNTER — Ambulatory Visit (HOSPITAL_COMMUNITY)
Admission: RE | Admit: 2024-04-21 | Discharge: 2024-04-21 | Disposition: A | Discharge status: Home / Self Care | Source: Ambulatory Visit | Attending: Cardiology | Admitting: Cardiology

## 2024-04-21 DIAGNOSIS — I498 Other specified cardiac arrhythmias: Secondary | ICD-10-CM | POA: Diagnosis present

## 2024-04-21 DIAGNOSIS — I2510 Atherosclerotic heart disease of native coronary artery without angina pectoris: Secondary | ICD-10-CM | POA: Insufficient documentation

## 2024-04-21 DIAGNOSIS — R42 Dizziness and giddiness: Secondary | ICD-10-CM | POA: Insufficient documentation

## 2024-04-21 LAB — ECHOCARDIOGRAM COMPLETE
Area-P 1/2: 2.66 cm2
P 1/2 time: 411 ms
S' Lateral: 3.25 cm

## 2024-04-23 ENCOUNTER — Other Ambulatory Visit

## 2024-04-23 DIAGNOSIS — E890 Postprocedural hypothyroidism: Secondary | ICD-10-CM

## 2024-04-23 DIAGNOSIS — C131 Malignant neoplasm of aryepiglottic fold, hypopharyngeal aspect: Secondary | ICD-10-CM

## 2024-04-23 DIAGNOSIS — R7989 Other specified abnormal findings of blood chemistry: Secondary | ICD-10-CM

## 2024-04-23 DIAGNOSIS — C139 Malignant neoplasm of hypopharynx, unspecified: Secondary | ICD-10-CM

## 2024-04-24 ENCOUNTER — Other Ambulatory Visit: Payer: Self-pay

## 2024-04-24 ENCOUNTER — Other Ambulatory Visit (HOSPITAL_BASED_OUTPATIENT_CLINIC_OR_DEPARTMENT_OTHER): Payer: Self-pay

## 2024-04-24 ENCOUNTER — Other Ambulatory Visit (HOSPITAL_COMMUNITY): Payer: Self-pay

## 2024-04-24 ENCOUNTER — Ambulatory Visit: Payer: Self-pay | Admitting: Internal Medicine

## 2024-04-24 LAB — TSH: TSH: 0.68 m[IU]/L (ref 0.40–4.50)

## 2024-04-24 MED ORDER — AMPHETAMINE-DEXTROAMPHETAMINE 20 MG PO TABS
20.0000 mg | ORAL_TABLET | Freq: Three times a day (TID) | ORAL | 0 refills | Status: AC
  Filled 2024-04-27: qty 270, 90d supply, fill #0

## 2024-04-24 MED ORDER — LORAZEPAM 1 MG PO TABS
1.0000 mg | ORAL_TABLET | Freq: Three times a day (TID) | ORAL | 0 refills | Status: DC
  Filled 2024-04-24: qty 10, 4d supply, fill #0
  Filled 2024-04-24: qty 215, 71d supply, fill #0
  Filled 2024-04-24: qty 225, 90d supply, fill #0

## 2024-04-27 ENCOUNTER — Other Ambulatory Visit (HOSPITAL_BASED_OUTPATIENT_CLINIC_OR_DEPARTMENT_OTHER): Payer: Self-pay

## 2024-05-01 ENCOUNTER — Other Ambulatory Visit (HOSPITAL_BASED_OUTPATIENT_CLINIC_OR_DEPARTMENT_OTHER): Payer: Self-pay

## 2024-05-01 MED ORDER — DEXAMETHASONE 4 MG PO TABS
ORAL_TABLET | ORAL | 1 refills | Status: DC
  Filled 2024-05-01: qty 36, 18d supply, fill #0
  Filled 2024-07-27: qty 36, 18d supply, fill #1

## 2024-05-04 ENCOUNTER — Other Ambulatory Visit: Payer: Self-pay

## 2024-05-05 ENCOUNTER — Encounter: Payer: Self-pay | Admitting: Cardiovascular Disease

## 2024-05-05 ENCOUNTER — Ambulatory Visit: Attending: Cardiovascular Disease | Admitting: Cardiovascular Disease

## 2024-05-05 VITALS — BP 150/80 | HR 81 | Ht 67.0 in | Wt 149.8 lb

## 2024-05-05 DIAGNOSIS — R002 Palpitations: Secondary | ICD-10-CM | POA: Diagnosis present

## 2024-05-05 DIAGNOSIS — I2510 Atherosclerotic heart disease of native coronary artery without angina pectoris: Secondary | ICD-10-CM | POA: Diagnosis present

## 2024-05-05 DIAGNOSIS — E782 Mixed hyperlipidemia: Secondary | ICD-10-CM | POA: Diagnosis present

## 2024-05-05 DIAGNOSIS — I6523 Occlusion and stenosis of bilateral carotid arteries: Secondary | ICD-10-CM | POA: Insufficient documentation

## 2024-05-05 DIAGNOSIS — I10 Essential (primary) hypertension: Secondary | ICD-10-CM | POA: Insufficient documentation

## 2024-05-05 NOTE — Assessment & Plan Note (Signed)
 History of hyperlipidemia on Repatha  with lipid profile performed 06/11/2023 bili total cholesterol 120, LDL 48 and HDL 49.

## 2024-05-05 NOTE — Assessment & Plan Note (Signed)
 History of essential hypertension with blood pressure measured today at 150/80.  He is not on antihypertensive medications.

## 2024-05-05 NOTE — Assessment & Plan Note (Signed)
 History of CAD status post bypass grafting x 5 by Dr. Kerrin 06/16/2008 with a free LIMA to his LAD, sequential vein to the first diagonal branch, 1st and 2nd marginal branches as well as the PDA.  I catheterized him again in setting of chest pain and abnormal Myoview  on 05/14/2016 revealing severe native and graft disease.  I recommended repeat bypass surgery however Dr. Kerrin was hesitant to perform this and therefore I performed stenting of his circumflex into his obtuse marginal branch 06/14/2016 with a 2.25 mm x 38 mm long Synergy drug-eluting stent with an excellent result.  He has been stable since.  He denies chest pain.

## 2024-05-05 NOTE — Patient Instructions (Signed)
 Medication Instructions:  Your physician recommends that you continue on your current medications as directed. Please refer to the Current Medication list given to you today.  *If you need a refill on your cardiac medications before your next appointment, please call your pharmacy*   Testing/Procedures: Your physician has requested that you have a carotid duplex. This test is an ultrasound of the carotid arteries in your neck. It looks at blood flow through these arteries that supply the brain with blood. Allow one hour for this exam. There are no restrictions or special instructions. This will take place at [address] 4th floor  **To do in June 2026**  Please note: We ask at that you not bring children with you during ultrasound (echo/ vascular) testing. Due to room size and safety concerns, children are not allowed in the ultrasound rooms during exams. Our front office staff cannot provide observation of children in our lobby area while testing is being conducted. An adult accompanying a patient to their appointment will only be allowed in the ultrasound room at the discretion of the ultrasound technician under special circumstances. We apologize for any inconvenience.   Follow-Up: At [facility], you and your health needs are our priority.  As part of our continuing mission to provide you with exceptional heart care, our providers are all part of one team.  This team includes your primary Cardiologist (physician) and Advanced Practice Providers or APPs (Physician Assistants and Nurse Practitioners) who all work together to provide you with the care you need, when you need it.  Your next appointment:   6 month(s)  Provider:   Damien Braver, NP         Then, Dorn Lesches, MD will plan to see you again in 12 month(s).     We recommend signing up for the patient portal called MyChart.  Sign up information is provided on this After Visit Summary.  MyChart is used to connect  with patients for Virtual Visits (Telemedicine).  Patients are able to view lab/test results, encounter notes, upcoming appointments, etc.  Non-urgent messages can be sent to your provider as well.   To learn more about what you can do with MyChart, go to [email or web address].

## 2024-05-05 NOTE — Assessment & Plan Note (Signed)
 History of carotid artery disease status post carotid stenting by myself in the past with recent carotid Dopplers performed 03/16/2024 revealing moderate in-stent restenosis on the right and moderate left ICA stenosis.  This will be repeated on an annual basis.

## 2024-05-05 NOTE — Progress Notes (Signed)
 05/05/2024 [name]   09-28-1951  [id number]  Primary Physician Baxley, Ronal PARAS, MD Primary Cardiologist: Dorn PARAS Lesches MD FACP, [place], [place], [place]  HPI:  [name] is a 73 y.o.   thin-appearing, married Caucasian male, father of 1, grandfather to 4 grandchildren who I last saw in the office 12/16/2023.[name]  He is accompanied by his wife [name] today.  He has a history of coronary artery disease status post bypass grafting x5 by Dr. Marcey Millers June 16, 2008, with a free LIMA to his LAD, sequential vein to the first diagonal branch, first and second obtuse marginal branches, as well as a vein to the PDA. His other problems include hypertension and hyperlipidemia. He denies chest pain or shortness of breath. I did perform cerebral angiography on him May 05, 2008, because of a question of a high-grade internal carotid artery stenosis read by the radiologist. However, this turned out to be a left external carotid artery stenosis. He does have high-grade ostial left internal mammary artery stenosis which necessitated the use of a free LIMA. He is statin intolerant. 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 A Myoview  stress test performed December 11, 2011, was normal and carotid Dopplers did show a moderately severe right ICA stenosis which we have been following by duplex ultrasound. 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 Since I saw him 12 months ago he has noticed increasing dyspnea on exertion over the last several months, especially noticeable while mowing his lawn. I obtained a Myoview  stress test on him based on this 03/29/16 that showed a large reversible lateral wall perfusion defect  consistent with ischemia. He underwent outpatient cardiac  catheterization by myself on 05/14/16 revealing severe native and graft vessel disease with normal LV function. Specifically, his RCA vein graft was occluded to an occluded dominant right and his circumflex vein graft which was sequential to 2 obtuse marginal branches was occluded at the aorta to patent continuation between OM1 and 2. He did have native circumflex disease. Dr. Millers saw him during his brief hospitalization to discuss the possibility of redo coronary artery bypass grafting although the consensus was that he would be better served with attempted PCI and stenting of his native circumflex coronary artery. I perform this electively on 06/14/16 and implanted a 2.25 mm x 38 mm long synergy drug-eluting stent in his AV groove circumflex into his obtuse marginal branch. He had excellent angiographic and clinical result. He was discharged on the following day. His dyspnea ultimately resolved. Since I saw him in the office 7 months ago he's remained clinically stable. He does have a right shoulder orthopedic issue but apparently he is not a surgical candidate.     He does complain of some breathing abnormality which does not does not necessarily sound like shortness of breath.  He had an excellent lipid profile performed by his PCP on 12/04/2018 with a total cholesterol 162, LDL 68 and HDL 72 on Praluent .  He has developed increasing PSA to 13 and ultimately underwent radical prostatectomy.  He is currently on hormonal therapy for this.   He underwent total laryngectomy in October 2023 as well as partial thyroidectomy.     Since I saw him in the office 5 months ago  he is remained stable.  He did see Damien Boos in the office 2 months ago.  He has been complaining of palpitations and did have an event monitor that showed 10 runs of SVT, frequent PACs with an 8.7% burden.  He denies chest pain but does have dyspnea probably related to his prior tobacco abuse and cancer.  He has had squamous cell cancer in  the past status post laryngectomy.  He has been diagnosed with cholangiocarcinoma with mets to his liver and lungs currently undergoing chemotherapy.   Current Meds  Medication Sig   acetaminophen  (TYLENOL ) 500 MG tablet Take 500 mg by mouth every 6 (six) hours as needed for mild pain (pain score 1-3) or moderate pain (pain score 4-6).   albuterol  (VENTOLIN  HFA) 108 (90 Base) MCG/ACT inhaler Inhale 2 puffs into the lungs every 6 (six) hours as needed for wheezing or shortness of breath.   albuterol  (VENTOLIN  HFA) 108 (90 Base) MCG/ACT inhaler Inhale 2 puffs into the lungs every 6 (six) hours as needed for wheezing or shortness of breath..   amphetamine -dextroamphetamine  (ADDERALL) 20 MG tablet Take 1 tablet (20 mg total) by mouth 3 (three) times daily.   aspirin  EC 81 MG tablet Take 81 mg by mouth daily. Swallow whole.   buPROPion  (WELLBUTRIN  XL) 300 MG 24 hr tablet Take 1 tablet (300 mg total) by mouth every morning.   Cyanocobalamin 2500 MCG SUBL Place 2,500 mcg under the tongue once a week.   dexamethasone  (DECADRON ) 4 MG tablet Take 1 tablet (4 mg total) by mouth in the morning AND 1 tablet (4 mg total) every evening. Take with meals. Take prescribed dose on the 3 days AFTER chemotherapy infusion treatment.   Evolocumab  (REPATHA  SURECLICK) 140 MG/ML SOAJ Inject 140 mg into the skin every 14 (fourteen) days.   levothyroxine  (SYNTHROID ) 150 MCG tablet Take 1 tablet (150 mcg total) by mouth daily.   lidocaine -prilocaine  (EMLA ) cream Apply topically as needed (apply to port site).   LORazepam  (ATIVAN ) 1 MG tablet Take 1 tablet (1 mg total) by mouth 2-3 times daily as needed. No more than 2.5 mg daily.   ondansetron  (ZOFRAN ) 8 MG tablet Take 1 tablet (8 mg total) by mouth every 8 (eight) hours as needed for nausea or vomiting.   ondansetron  (ZOFRAN -ODT) 4 MG disintegrating tablet Take 4 mg by mouth every 8 (eight) hours as needed.   oxyCODONE  (OXY IR/ROXICODONE ) 5 MG immediate release tablet Take 1  tablet (5 mg total) by mouth every 4 (four) hours as needed for moderate pain (4-6).   pantoprazole  (PROTONIX ) 40 MG tablet Take 1 tablet (40 mg total) by mouth daily.   sertraline  (ZOLOFT ) 100 MG tablet Take 1 tablet (100 mg total) by mouth daily.   sertraline  (ZOLOFT ) 100 MG tablet Take 2 tablets (200 mg total) by mouth daily.   Current Facility-Administered Medications for the 05/05/24 encounter (Office Visit) with Court Dorn PARAS, MD  Medication   ticagrelor  (BRILINTA ) tablet 90 mg     Allergies  Allergen Reactions   Crestor [Rosuvastatin Calcium] Other (See Comments)    Difficulty swallowing   Tramadol  Shortness Of Breath   Lisinopril  Cough   Gabapentin  Other (See Comments)    Hallucinations   Tricor [Fenofibrate] Other (See Comments)    UNSPECIFIED REACTION   Lipitor [Atorvastatin Calcium] Other (See Comments)    Muscle spasms    Social History   Socioeconomic History   Marital status: Married    Spouse name: Not on file  Number of children: Not on file   Years of education: Not on file   Highest education level: Associate degree: occupational, technical, or vocational program  Occupational History   Not on file  Tobacco Use   Smoking status: Former    Current packs/day: 0.00    Average packs/day: 3.0 packs/day for 30.0 years (90.0 ttl pk-yrs)    Types: Cigarettes    Start date: 01/30/1971    Quit date: 01/29/2001    Years since quitting: 23.2   Smokeless tobacco: Never  Vaping Use   Vaping status: Never Used  Substance and Sexual Activity   Alcohol use: Yes    Alcohol/week: 2.0 standard drinks of alcohol    Types: 2 Cans of beer per week    Comment: 2-3 beers per week   Drug use: No   Sexual activity: Not Currently  Other Topics Concern   Not on file  Social History Narrative   Not on file   Social Drivers of Health   Financial Resource Strain: Low Risk  (11/28/2023)   Overall Financial Resource Strain (CARDIA)    Difficulty of Paying Living Expenses:  Not hard at all  Food Insecurity: No Food Insecurity (11/28/2023)   Hunger Vital Sign    Worried About Running Out of Food in the Last Year: Never true    Ran Out of Food in the Last Year: Never true  Transportation Needs: No Transportation Needs (11/28/2023)   PRAPARE - Administrator, Civil Service (Medical): No    Lack of Transportation (Non-Medical): No  Physical Activity: Insufficiently Active (11/28/2023)   Exercise Vital Sign    Days of Exercise per Week: 1 day    Minutes of Exercise per Session: 60 min  Stress: Stress Concern Present (11/28/2023)   [employer] of Occupational Health - Occupational Stress Questionnaire    Feeling of Stress : Very much  Social Connections: Moderately Isolated (11/28/2023)   Social Connection and Isolation Panel    Frequency of Communication with Friends and Family: Twice a week    Frequency of Social Gatherings with Friends and Family: Once a week    Attends Religious Services: Never    Database administrator or Organizations: No    Attends Banker Meetings: Never    Marital Status: Married  Catering manager Violence: Not At Risk (06/14/2023)   Humiliation, Afraid, Rape, and Kick questionnaire    Fear of Current or Ex-Partner: No    Emotionally Abused: No    Physically Abused: No    Sexually Abused: No     Review of Systems: General: negative for chills, fever, night sweats or weight changes.  Cardiovascular: negative for chest pain, dyspnea on exertion, edema, orthopnea, palpitations, paroxysmal nocturnal dyspnea or shortness of breath Dermatological: negative for rash Respiratory: negative for cough or wheezing Urologic: negative for hematuria Abdominal: negative for nausea, vomiting, diarrhea, bright red blood per rectum, melena, or hematemesis Neurologic: negative for visual changes, syncope, or dizziness All other systems reviewed and are otherwise negative except as noted above.    Blood pressure (!)  150/80, pulse 81, height 5' 7 (1.702 m), weight 149 lb 12.8 oz (67.9 kg), SpO2 96%.  General appearance: alert and no distress Neck: no adenopathy, no carotid bruit, no JVD, supple, symmetrical, trachea midline, and thyroid  not enlarged, symmetric, no tenderness/mass/nodules Lungs: clear to auscultation bilaterally Heart: regular rate and rhythm, S1, S2 normal, no murmur, click, rub or gallop Extremities: extremities normal, atraumatic, no cyanosis or  edema Pulses: 2+ and symmetric Skin: Skin color, texture, turgor normal. No rashes or lesions Neurologic: Grossly normal  EKG EKG Interpretation Date/Time:  Tuesday May 05 2024 11:38:55 EDT Ventricular Rate:  81 PR Interval:  138 QRS Duration:  132 QT Interval:  392 QTC Calculation: 455 R Axis:   -87  Text Interpretation: Unusual P axis and short PR, probable junctional tachycardia Left axis deviation Right bundle branch block Inferior infarct (cited on or before 10-Jun-2019) When compared with ECG of 13-Sep-2023 13:34, Junctional rhythm has replaced Sinus rhythm Confirmed by Court Carrier [id number]) on 05/05/2024 11:49:08 AM    ASSESSMENT AND PLAN:   Coronary artery disease History of CAD status post bypass grafting x 5 by Dr. Kerrin 06/16/2008 with a free LIMA to his LAD, sequential vein to the first diagonal branch, 1st and 2nd marginal branches as well as the PDA.  I catheterized him again in setting of chest pain and abnormal Myoview  on 05/14/2016 revealing severe native and graft disease.  I recommended repeat bypass surgery however Dr. Kerrin was hesitant to perform this and therefore I performed stenting of his circumflex into his obtuse marginal branch 06/14/2016 with a 2.25 mm x 38 mm long Synergy drug-eluting stent with an excellent result.  He has been stable since.  He denies chest pain.  Hypertension History of essential hypertension with blood pressure measured today at 150/80.  He is not on antihypertensive  medications.  Internal carotid artery stenosis History of carotid artery disease status post carotid stenting by myself in the past with recent carotid Dopplers performed 03/16/2024 revealing moderate in-stent restenosis on the right and moderate left ICA stenosis.  This will be repeated on an annual basis.  Hyperlipidemia History of hyperlipidemia on Repatha  with lipid profile performed 06/11/2023 bili total cholesterol 120, LDL 48 and HDL 49.  Palpitations Patient complains of palpitations with the recent event monitor performed 04/06/2024 revealing 10 runs of SVT longest of which was 19 beats, frequent PACs with an 8.7% burden.  The patient does not wish to pursue pharmacologic therapy.     Carrier DOROTHA Court MD FACP,FACC,FAHA, [facility] 05/05/2024 11:57 AM

## 2024-05-05 NOTE — Assessment & Plan Note (Signed)
 Patient complains of palpitations with the recent event monitor performed 04/06/2024 revealing 10 runs of SVT longest of which was 19 beats, frequent PACs with an 8.7% burden.  The patient does not wish to pursue pharmacologic therapy.

## 2024-05-06 ENCOUNTER — Other Ambulatory Visit (HOSPITAL_BASED_OUTPATIENT_CLINIC_OR_DEPARTMENT_OTHER): Payer: Self-pay

## 2024-05-06 MED ORDER — UBRELVY 100 MG PO TABS
100.0000 mg | ORAL_TABLET | ORAL | 11 refills | Status: DC | PRN
  Filled 2024-05-06: qty 16, 30d supply, fill #0

## 2024-05-06 MED ORDER — QULIPTA 60 MG PO TABS
1.0000 | ORAL_TABLET | Freq: Every day | ORAL | 5 refills | Status: DC
  Filled 2024-05-06: qty 30, 30d supply, fill #0

## 2024-05-07 ENCOUNTER — Other Ambulatory Visit: Payer: Self-pay

## 2024-05-07 ENCOUNTER — Other Ambulatory Visit (HOSPITAL_BASED_OUTPATIENT_CLINIC_OR_DEPARTMENT_OTHER): Payer: Self-pay

## 2024-05-12 ENCOUNTER — Other Ambulatory Visit (HOSPITAL_BASED_OUTPATIENT_CLINIC_OR_DEPARTMENT_OTHER): Payer: Self-pay

## 2024-05-12 MED ORDER — FLUCONAZOLE 150 MG PO TABS
150.0000 mg | ORAL_TABLET | Freq: Every day | ORAL | 0 refills | Status: DC
  Filled 2024-05-12: qty 14, 14d supply, fill #0

## 2024-06-10 ENCOUNTER — Encounter: Payer: Self-pay | Admitting: Cardiovascular Disease

## 2024-06-10 ENCOUNTER — Other Ambulatory Visit: Payer: Self-pay

## 2024-06-10 ENCOUNTER — Other Ambulatory Visit (HOSPITAL_BASED_OUTPATIENT_CLINIC_OR_DEPARTMENT_OTHER): Payer: Self-pay

## 2024-06-10 MED ORDER — METOPROLOL TARTRATE 25 MG PO TABS
12.5000 mg | ORAL_TABLET | Freq: Two times a day (BID) | ORAL | 1 refills | Status: AC
  Filled 2024-06-10: qty 90, 90d supply, fill #0

## 2024-06-11 ENCOUNTER — Other Ambulatory Visit: Payer: Self-pay

## 2024-06-12 ENCOUNTER — Other Ambulatory Visit

## 2024-06-12 ENCOUNTER — Other Ambulatory Visit: Payer: Medicare Other | Admitting: Internal Medicine

## 2024-06-12 DIAGNOSIS — D075 Carcinoma in situ of prostate: Secondary | ICD-10-CM

## 2024-06-12 DIAGNOSIS — Z125 Encounter for screening for malignant neoplasm of prostate: Secondary | ICD-10-CM

## 2024-06-12 DIAGNOSIS — Z0000 Encounter for general adult medical examination without abnormal findings: Secondary | ICD-10-CM

## 2024-06-12 DIAGNOSIS — Z789 Other specified health status: Secondary | ICD-10-CM

## 2024-06-12 DIAGNOSIS — C139 Malignant neoplasm of hypopharynx, unspecified: Secondary | ICD-10-CM

## 2024-06-12 DIAGNOSIS — E7849 Other hyperlipidemia: Secondary | ICD-10-CM

## 2024-06-12 DIAGNOSIS — I10 Essential (primary) hypertension: Secondary | ICD-10-CM

## 2024-06-12 DIAGNOSIS — R7989 Other specified abnormal findings of blood chemistry: Secondary | ICD-10-CM

## 2024-06-14 ENCOUNTER — Ambulatory Visit: Payer: Self-pay | Admitting: Internal Medicine

## 2024-06-15 ENCOUNTER — Ambulatory Visit: Payer: Medicare Other | Admitting: Internal Medicine

## 2024-06-15 ENCOUNTER — Ambulatory Visit: Admitting: Internal Medicine

## 2024-06-15 ENCOUNTER — Encounter: Payer: Self-pay | Admitting: Internal Medicine

## 2024-06-15 ENCOUNTER — Other Ambulatory Visit (HOSPITAL_BASED_OUTPATIENT_CLINIC_OR_DEPARTMENT_OTHER): Payer: Self-pay

## 2024-06-15 VITALS — BP 110/60 | Ht 67.0 in | Wt 144.0 lb

## 2024-06-15 DIAGNOSIS — Z9861 Coronary angioplasty status: Secondary | ICD-10-CM

## 2024-06-15 DIAGNOSIS — Z9889 Other specified postprocedural states: Secondary | ICD-10-CM

## 2024-06-15 DIAGNOSIS — R531 Weakness: Secondary | ICD-10-CM

## 2024-06-15 DIAGNOSIS — F419 Anxiety disorder, unspecified: Secondary | ICD-10-CM | POA: Diagnosis not present

## 2024-06-15 DIAGNOSIS — C7800 Secondary malignant neoplasm of unspecified lung: Secondary | ICD-10-CM

## 2024-06-15 DIAGNOSIS — C131 Malignant neoplasm of aryepiglottic fold, hypopharyngeal aspect: Secondary | ICD-10-CM

## 2024-06-15 DIAGNOSIS — C221 Intrahepatic bile duct carcinoma: Secondary | ICD-10-CM

## 2024-06-15 DIAGNOSIS — E039 Hypothyroidism, unspecified: Secondary | ICD-10-CM

## 2024-06-15 DIAGNOSIS — Z8673 Personal history of transient ischemic attack (TIA), and cerebral infarction without residual deficits: Secondary | ICD-10-CM

## 2024-06-15 DIAGNOSIS — D649 Anemia, unspecified: Secondary | ICD-10-CM | POA: Diagnosis not present

## 2024-06-15 DIAGNOSIS — Z8659 Personal history of other mental and behavioral disorders: Secondary | ICD-10-CM

## 2024-06-15 DIAGNOSIS — Z951 Presence of aortocoronary bypass graft: Secondary | ICD-10-CM

## 2024-06-15 DIAGNOSIS — I771 Stricture of artery: Secondary | ICD-10-CM

## 2024-06-15 DIAGNOSIS — K2100 Gastro-esophageal reflux disease with esophagitis, without bleeding: Secondary | ICD-10-CM

## 2024-06-15 DIAGNOSIS — C139 Malignant neoplasm of hypopharynx, unspecified: Secondary | ICD-10-CM

## 2024-06-15 DIAGNOSIS — I6521 Occlusion and stenosis of right carotid artery: Secondary | ICD-10-CM

## 2024-06-15 DIAGNOSIS — Z0000 Encounter for general adult medical examination without abnormal findings: Secondary | ICD-10-CM

## 2024-06-15 DIAGNOSIS — E890 Postprocedural hypothyroidism: Secondary | ICD-10-CM

## 2024-06-15 DIAGNOSIS — I10 Essential (primary) hypertension: Secondary | ICD-10-CM | POA: Diagnosis not present

## 2024-06-15 DIAGNOSIS — C61 Malignant neoplasm of prostate: Secondary | ICD-10-CM

## 2024-06-15 DIAGNOSIS — Z789 Other specified health status: Secondary | ICD-10-CM

## 2024-06-15 DIAGNOSIS — J449 Chronic obstructive pulmonary disease, unspecified: Secondary | ICD-10-CM

## 2024-06-15 DIAGNOSIS — E7849 Other hyperlipidemia: Secondary | ICD-10-CM

## 2024-06-15 DIAGNOSIS — Z8546 Personal history of malignant neoplasm of prostate: Secondary | ICD-10-CM

## 2024-06-15 DIAGNOSIS — M5090 Cervical disc disorder, unspecified, unspecified cervical region: Secondary | ICD-10-CM

## 2024-06-15 MED ORDER — AMITRIPTYLINE HCL 10 MG PO TABS
10.0000 mg | ORAL_TABLET | Freq: Every day | ORAL | 1 refills | Status: AC
  Filled 2024-06-15: qty 30, 30d supply, fill #0

## 2024-06-15 MED ORDER — RIZATRIPTAN BENZOATE 10 MG PO TABS
10.0000 mg | ORAL_TABLET | ORAL | 0 refills | Status: AC | PRN
  Filled 2024-06-15: qty 10, 30d supply, fill #0

## 2024-06-15 NOTE — Progress Notes (Addendum)
 Subjective:   [name] is a 73 y.o. male who presents for Medicare Annual/Subsequent preventive examination.  Visit Complete: In person  Patient Medicare AWV questionnaire was completed by the patient on 06/15/2024; I have confirmed that all information answered by patient is correct and no changes since this date.  Cardiac Risk Factors include: advanced age (>32men, >80 women);male gender;hypertension;dyslipidemia     Objective:    Today's Vitals   06/15/24 1452  BP: 110/60  Weight: 144 lb (65.3 kg)  Height: 5' 7 (1.702 m)  PainSc: 5   PainLoc: Shoulder   Body mass index is 22.55 kg/m.     06/15/2024    3:00 PM 10/29/2023   11:52 AM 09/06/2023    6:51 AM 08/27/2023   11:32 AM 06/14/2023   10:10 AM 05/05/2021    9:39 AM 01/01/2021   10:18 AM  Advanced Directives  Does Patient Have a Medical Advance Directive? Yes Yes  Yes Yes No No  Type of Advance Directive Living will;Healthcare Power of [employer] Power of Industry;Living will   Living will;Healthcare Power of Attorney    Does patient want to make changes to medical advance directive?  No - Patient declined No - Patient declined No - Patient declined     Copy of Healthcare Power of Attorney in Chart? No - copy requested No - copy requested   No - copy requested    Would patient like information on creating a medical advance directive?   No - Patient declined No - Patient declined  No - Patient declined     Current Medications (verified) Outpatient Encounter Medications as of 06/15/2024  Medication Sig   acetaminophen  (TYLENOL ) 500 MG tablet Take 500 mg by mouth every 6 (six) hours as needed for mild pain (pain score 1-3) or moderate pain (pain score 4-6).   albuterol  (VENTOLIN  HFA) 108 (90 Base) MCG/ACT inhaler Inhale 2 puffs into the lungs every 6 (six) hours as needed for wheezing or shortness of breath.   albuterol  (VENTOLIN  HFA) 108 (90 Base) MCG/ACT inhaler Inhale 2 puffs into the lungs every 6 (six)  hours as needed for wheezing or shortness of breath..   amphetamine -dextroamphetamine  (ADDERALL) 20 MG tablet Take 1 tablet (20 mg total) by mouth 3 (three) times daily. Take in the morning, at noon and at 4pm.   amphetamine -dextroamphetamine  (ADDERALL) 20 MG tablet Take 1 tablet (20 mg total) by mouth 3 (three) times daily.   aspirin  EC 81 MG tablet Take 81 mg by mouth daily. Swallow whole.   Atogepant  (QULIPTA ) 60 MG TABS Take 1 tablet (60 mg total) by mouth daily.   buPROPion  (WELLBUTRIN  XL) 300 MG 24 hr tablet Take 1 tablet (300 mg total) by mouth every morning.   Cyanocobalamin 2500 MCG SUBL Place 2,500 mcg under the tongue once a week.   dexamethasone  (DECADRON ) 4 MG tablet Take 1 tablet (4 mg total) by mouth in the morning AND 1 tablet (4 mg total) every evening. Take with meals. Take prescribed dose on the 3 days AFTER chemotherapy infusion treatment.   Evolocumab  (REPATHA  SURECLICK) 140 MG/ML SOAJ Inject 140 mg into the skin every 14 (fourteen) days.   levothyroxine  (SYNTHROID ) 150 MCG tablet Take 1 tablet (150 mcg total) by mouth daily.   lidocaine -prilocaine  (EMLA ) cream Apply topically as needed (apply to port site).   LORazepam  (ATIVAN ) 1 MG tablet Take 1 tablet (1 mg total) by mouth 2 to 3 times a day as needed. ( no more than  2.5 per day)   LORazepam  (ATIVAN ) 1 MG tablet Take 1 tablet (1 mg total) by mouth 2-3 times daily as needed. No more than 2.5 mg daily.   metoprolol  tartrate (LOPRESSOR ) 25 MG tablet Take 0.5 tablets (12.5 mg total) by mouth 2 (two) times daily.   ondansetron  (ZOFRAN ) 8 MG tablet Take 1 tablet (8 mg total) by mouth every 8 (eight) hours as needed for nausea or vomiting.   ondansetron  (ZOFRAN -ODT) 4 MG disintegrating tablet Take 4 mg by mouth every 8 (eight) hours as needed.   oxyCODONE  (OXY IR/ROXICODONE ) 5 MG immediate release tablet Take 1 tablet (5 mg total) by mouth every 4 (four) hours as needed for moderate pain (4-6).   pantoprazole  (PROTONIX ) 40 MG tablet  Take 1 tablet (40 mg total) by mouth daily.   sertraline  (ZOLOFT ) 100 MG tablet Take 1 tablet (100 mg total) by mouth daily.   sertraline  (ZOLOFT ) 100 MG tablet Take 2 tablets (200 mg total) by mouth daily.   Ubrogepant  (UBRELVY ) 100 MG TABS Take 1 tablet at the onset of migraine. May repeat dose once in 2 hours if no relief. Do not exceed 2 doses in 24 hours.   [DISCONTINUED] clotrimazole  (LOTRIMIN  AF) 1 % cream Apply 1 Application topically 2 (two) times daily. (Patient not taking: Reported on 05/05/2024)   [DISCONTINUED] fluconazole  (DIFLUCAN ) 150 MG tablet Take 1 tablet (150 mg total) by mouth daily for 14 days.   [DISCONTINUED] primidone  (MYSOLINE ) 50 MG tablet Take 1 tablet (50 mg total) by mouth 2 (two) times a day Indications: essential tremor.   [DISCONTINUED] prochlorperazine  (COMPAZINE ) 10 MG tablet Take 1 tablet (10 mg total) by mouth every 6 (six) hours as needed for nausea or vomiting.   Facility-Administered Encounter Medications as of 06/15/2024  Medication   ticagrelor  (BRILINTA ) tablet 90 mg    Allergies (verified) Crestor [rosuvastatin calcium], Tramadol , Lisinopril , Gabapentin , Tricor [fenofibrate], and Lipitor [atorvastatin calcium]   History: Past Medical History:  Diagnosis Date   ADHD (attention deficit hyperactivity disorder)    Anxiety    Arthritis    NECK, HANDS, KNEES (06/14/2016)   Atypical mole 08/07/2021   Mid Back (Atypical Proliferation)   CAD (coronary artery disease)    Carotid artery stenosis    Complication of anesthesia    Reports that he had trouble swallowing post anesthesia- CABG   COPD (chronic obstructive pulmonary disease) (HCC) 12/11/2011   r/s mv - EF 72%; exercise capacity 13 METS; no exercised induced ischemic EKG changes   Depression, major    Dyspnea    with exertion   GERD (gastroesophageal reflux disease)    HTN (hypertension), benign 04/16/2008   echo - EF >55%; no regional wall or valvular abnormalities   Hyperlipidemia     statin intolerant   Hypothyroidism    Migraine    crippling headaches as a kid; silent migraines now, no pain, couple times/week (06/14/2016)   Peripheral vascular disease (HCC)    Prostate cancer (HCC) 04/2019   recent dx - Dr Renay Moats   S/P angioplasty with stent 06/14/16 PCI & DES of high grade AV groove LCX and 1st OM  06/15/2016   Squamous cell cancer of tongue (HCC) ~ 2002   [facility]   Stroke [facility])    hx Thalamic Ischemic CVA   Subclavian steal syndrome 06/17/2012   carotid doppler - R systolic brachial pressure , L ; R subclavian artery - proxmial obsstruction w/ abnormal monophasic waveforms, R ECA known  occlusive disease; L ECA narrowing w/ 70-99% diameter reductiona   TIA (transient ischemic attack) 2005   Viral hepatitis 1970s   non specific   Past Surgical History:  Procedure Laterality Date   BACK SURGERY     BIOPSY  02/20/2018   Procedure: BIOPSY;  Surgeon: Kristie Lamprey, MD;  Location: [facility];  Service: Endoscopy;;   BIOPSY TONGUE  ~ 2002   base of tongue and uvula   CARDIAC CATHETERIZATION  04/26/2008   L circumflex 99% stenosed midportion straddling a marginal branch; RCA total priximally w/ grade 2 L to R collaterals; renal arteries widely patent; LAD 30% segmental proximal stenosis   CARDIAC CATHETERIZATION N/A 05/14/2016   Procedure: Left Heart Cath and Cors/Grafts Angiography;  Surgeon: Dorn JINNY Lesches, MD;  Location: [facility] INV[facility];  Service: Cardiovascular;  Laterality: N/A;   CARDIAC CATHETERIZATION N/A 06/14/2016   Procedure: Coronary Stent Intervention;  Surgeon: Dorn JINNY Lesches, MD;  Location: [facility] INVAS[facility] CV LAB;  Service: Cardiovascular;  Laterality: N/A;   CERVICAL DISC SURGERY  2000   Kritzer    Posterior approach   COLONOSCOPY WITH PROPOFOL  N/A 02/20/2018   Procedure: COLONOSCOPY WITH PROPOFOL ;  Surgeon: Kristie Lamprey, MD;  Location: [facility];  Service: Endoscopy;  Laterality: N/A;    CORONARY ANGIOPLASTY WITH STENT PLACEMENT     CORONARY ARTERY BYPASS GRAFT  2009   x5; LIMA to LAd, sequential vein to first diagonal branch, first and second obtuse marginal branches; vein to PDA   ESOPHAGOGASTRODUODENOSCOPY (EGD) WITH PROPOFOL  N/A 02/20/2018   Procedure: ESOPHAGOGASTRODUODENOSCOPY (EGD) WITH PROPOFOL ;  Surgeon: Kristie Lamprey, MD;  Location[facility];  Service: Endoscopy;  Laterality: N/A;   INSERTION OF MESH N/A 10/29/2017   Procedure: INSERTION OF MESH;  Surgeon: Belinda Cough, MD;  Location: [facility] OR;  Service: General;  Laterality: N/A;   LUMBAR DISC SURGERY  06/17/2019   LUMBAR 2 & 3    LUMBAR LAMINECTOMY/DECOMPRESSION MICRODISCECTOMY N/A 06/17/2019   Procedure: Lumbar two -Lumbar three decompression and disectomy;  Surgeon: Burnetta Aures, MD;  [facility] OR;  Service: Orthopedics;  Laterality: N/A;  3 hrs   POLYPECTOMY  02/20/2018   Procedure: POLYPECTOMY;  Surgeon: Kristie Lamprey, MD;  [facility];  Service: Endoscopy;;   REVERSE SHOULDER ARTHROPLASTY Right 09/06/2023   Procedure: REVERSE SHOULDER ARTHROPLASTY;  Surgeon: Kay Kemps, MD;  Location: [facility] OR[facility];  Service: Orthopedics;  Laterality: Right;  interscalene  100   SHOULDER ARTHROSCOPY WITH SUBACROMIAL DECOMPRESSION AND OPEN ROTATOR C Left 09/28/2013   Procedure: LEFT SHOULDER ARTHROSCOPY WITH SUBACROMIAL DECOMPRESSION AND MINI OPEN ROTATOR CUFF REPAIR, OPEN DISTAL CLAVICLE RESECTION AND OPEN BICEP TENDODESIS ;  Surgeon: Elspeth JONELLE Kay, MD;  Location: [facility] OR;  Service: Orthopedics;  Laterality: Left;   TRANSCAROTID ARTERY REVASCULARIZATION  Right 05/05/2021   Procedure: RIGHT TRANSCAROTID ARTERY REVASCULARIZATION;  Surgeon: Magda Debby SAILOR, MD;  Location[facility];  Service: Vascular;  Laterality: Right;   UMBILICAL HERNIA REPAIR N/A 10/29/2017   Procedure: UMBILICAL HERNIA REPAIR;  Surgeon: Belinda Cough, MD;  [facility];  Service: General;  Laterality: N/A;   Family History  Problem Relation  Age of Onset   Cancer Mother        ovarian   Stroke Father    Diabetes Father    Arthritis Father    Hypertension Brother    Social History   Socioeconomic History   Marital status: Married    Spouse name: Not on file   Number of children: Not on file  Years of education: Not on file   Highest education level: Some college, no degree  Occupational History   Not on file  Tobacco Use   Smoking status: Former    Current packs/day: 0.00    Average packs/day: 3.0 packs/day for 30.0 years (90.0 ttl pk-yrs)    Types: Cigarettes    Start date: 01/30/1971    Quit date: 01/29/2001    Years since quitting: 23.3   Smokeless tobacco: Never  Vaping Use   Vaping status: Never Used  Substance and Sexual Activity   Alcohol use: Yes    Alcohol/week: 2.0 standard drinks of alcohol    Types: 2 Cans of beer per week    Comment: 2-3 beers per week   Drug use: No   Sexual activity: Not Currently  Other Topics Concern   Not on file  Social History Narrative   Not on file   Social Drivers of Health   Financial Resource Strain: Low Risk  (06/15/2024)   Overall Financial Resource Strain (CARDIA)    Difficulty of Paying Living Expenses: Not hard at all  Food Insecurity: No Food Insecurity (06/14/2024)   Hunger Vital Sign    Worried About Running Out of Food in the Last Year: Never true    Ran Out of Food in the Last Year: Never true  Transportation Needs: No Transportation Needs (06/15/2024)   PRAPARE - Administrator, Civil Service (Medical): No    Lack of Transportation (Non-Medical): No  Physical Activity: Insufficiently Active (06/15/2024)   Exercise Vital Sign    Days of Exercise per Week: 1 day    Minutes of Exercise per Session: 20 min  Stress: Stress Concern Present (06/15/2024)   [employer] of Occupational Health - Occupational Stress Questionnaire    Feeling of Stress: Rather much  Social Connections: Moderately Isolated (06/15/2024)   Social Connection and  Isolation Panel    Frequency of Communication with Friends and Family: More than three times a week    Frequency of Social Gatherings with Friends and Family: Once a week    Attends Religious Services: Never    Database administrator or Organizations: No    Attends Banker Meetings: Never    Marital Status: Married    Tobacco Counseling Counseling given: No   Clinical Intake:     Pain Score: 5                   Activities of Daily Living    06/15/2024    2:56 PM 08/27/2023   11:34 AM  In your present state of health, do you have any difficulty performing the following activities:  Hearing? 0   Vision? 1   Difficulty concentrating or making decisions? 0   Walking or climbing stairs? 0   Dressing or bathing? 0   Doing errands, shopping? 1 0  Preparing Food and eating ? Y   Using the Toilet? N   In the past six months, have you accidently leaked urine? N   Do you have problems with loss of bowel control? N   Managing your Medications? N   Managing your Finances? N   Housekeeping or managing your Housekeeping? N     Patient Care Team: Perri Ronal PARAS, MD as PCP - General (Internal Medicine) Court Dorn PARAS, MD as PCP - Cardiology (Cardiology) Belinda Cough, MD as Consulting Physician (General Surgery) Livingston Rigg, MD as Consulting Physician (Dermatology)  Indicate any recent Medical Services you  may have received from other than [facility] providers in the past year (date may be approximate).     Assessment:   This is a routine wellness examination for [facility].  Hearing/Vision screen No results found.   Goals Addressed   None    Depression Screen    06/15/2024    3:07 PM 01/31/2023   10:38 AM 03/21/2020    5:18 PM 12/09/2018   11:23 AM 01/03/2017   10:23 AM 03/11/2014   10:37 AM  PHQ 2/9 Scores  PHQ - 2 Score 5 4 2 4 6 6   PHQ- 9 Score 21 12 2 14 18 14     Fall Risk    06/15/2024    3:00 PM 06/14/2023   10:10 AM 01/31/2023   10:38 AM  04/30/2022    3:03 PM 04/06/2021   11:11 AM  Fall Risk   Falls in the past year? 1 0 0 1 0  Number falls in past yr: 1 0 0 0 0  Injury with Fall? 0 0 0 0 0  Risk for fall due to : Impaired balance/gait No Fall Risks No Fall Risks History of fall(s) No Fall Risks  Follow up Falls prevention discussed;Education provided;Falls evaluation completed  Falls prevention discussed Falls evaluation completed  Falls evaluation completed      Data saved with a previous flowsheet row definition    MEDICARE RISK AT HOME: Medicare Risk at Home Any stairs in or around the home?: Yes If so, are there any without handrails?: No Home free of loose throw rugs in walkways, pet beds, electrical cords, etc?: Yes Adequate lighting in your home to reduce risk of falls?: Yes Life alert?: No Use of a cane, walker or w/c?: No Grab bars in the bathroom?: No Shower chair or bench in shower?: No Elevated toilet seat or a handicapped toilet?: No  TIMED UP AND GO:  Was the test performed?  No    Cognitive Function:        06/15/2024    2:59 PM 06/14/2023   10:07 AM  6CIT Screen  What Year? 0 points 0 points  What month? 0 points 0 points  What time? 0 points 0 points  Count back from 20 0 points 0 points  Months in reverse 0 points 0 points  Repeat phrase 0 points 0 points  Total Score 0 points 0 points    Immunizations Immunization History  Administered Date(s) Administered   Fluad  Quad(high Dose 65+) 07/11/2021, 07/13/2022   Fluad  Trivalent(High Dose 65+) 08/12/2023   INFLUENZA, HIGH DOSE SEASONAL PF 08/07/2018   Influenza Split 06/28/2011   Influenza Whole 07/18/2010   Influenza,inj,Quad PF,6+ Mos 09/03/2013, 09/09/2014, 06/15/2016, 07/19/2017, 06/09/2019   Influenza-Unspecified 10/18/2015, 06/27/2020   PFIZER Comirnaty ETTERGray Top)Covid-19 Tri-Sucrose Vaccine 07/13/2022   PFIZER(Purple Top)SARS-COV-2 Vaccination 10/21/2019, 11/12/2019, 06/27/2020   Pfizer Covid-19 Vaccine Bivalent Booster 28yrs  & up 07/11/2021   Pfizer(Comirnaty )Fall Seasonal Vaccine 12 years and older 08/12/2023   Tdap 12/25/2010, 11/20/2012   Zoster Recombinant(Shingrix ) 07/19/2020, 02/06/2022, 04/20/2022   Zoster, Live 06/28/2011    TDAP status: Due, Education has been provided regarding the importance of this vaccine. Advised may receive this vaccine at local pharmacy or Health Dept. Aware to provide a copy of the vaccination record if obtained from local pharmacy or Health Dept. Verbalized acceptance and understanding.  Flu Vaccine status: Due, Education has been provided regarding the importance of this vaccine. Advised may receive this vaccine at local pharmacy or Health Dept. Aware to provide a copy  of the vaccination record if obtained from local pharmacy or Health Dept. Verbalized acceptance and understanding.  Pneumococcal vaccine status: Due, Education has been provided regarding the importance of this vaccine. Advised may receive this vaccine at local pharmacy or Health Dept. Aware to provide a copy of the vaccination record if obtained from local pharmacy or Health Dept. Verbalized acceptance and understanding.  Covid-19 vaccine status: Information provided on how to obtain vaccines.   Qualifies for Shingles Vaccine? Yes   Zostavax completed Yes   Shingrix  Completed?: Yes  Screening Tests Health Maintenance  Topic Date Due   Influenza Vaccine  05/01/2024   COVID-19 Vaccine (7 - Pfizer risk 2024-25 season) 06/01/2024   Medicare Annual Wellness (AWV)  06/13/2024   Pneumococcal Vaccine: 50+ Years (1 of 2 - PCV) 06/30/2024 (Originally 10/04/1969)   Colonoscopy  10/19/2032   Hepatitis C Screening  Completed   Zoster Vaccines- Shingrix   Completed   HPV VACCINES  Aged Out   Meningococcal B Vaccine  Aged Out   DTaP/Tdap/Td  Discontinued    Health Maintenance  Health Maintenance Due  Topic Date Due   Influenza Vaccine  05/01/2024   COVID-19 Vaccine (7 - Pfizer risk 2024-25 season) 06/01/2024    Medicare Annual Wellness (AWV)  06/13/2024    Colorectal cancer screening: Type of screening: Colonoscopy. Completed 10/19/2022. Repeat every 10 years  Lung Cancer Screening: (Low Dose CT Chest recommended if Age 47-80 years, 20 pack-year currently smoking OR have quit w/in 15years.) does not qualify.    Additional Screening:  Hepatitis C Screening: does not qualify; Completed   Vision Screening: Recommended annual ophthalmology exams for early detection of glaucoma and other disorders of the eye. Is the patient up to date with their annual eye exam?  No  Who is the provider or what is the name of the office in which the patient attends annual eye exams? [facility] If pt is not established with a provider, would they like to be referred to a provider to establish care? Yes . [facility]  Dental Screening: Recommended annual dental exams for proper oral hygiene  Community Resource Referral / Chronic Care Management: CRR required this visit?  No   CCM required this visit?  No     Plan:     I have personally reviewed and noted the following in the patient's chart:   Medical and social history Use of alcohol, tobacco or illicit drugs  Current medications and supplements including opioid prescriptions. Patient is currently taking opioid prescriptions. Information provided to patient regarding non-opioid alternatives. Patient advised to discuss non-opioid treatment plan with their provider. Functional ability and status Nutritional status Physical activity Advanced directives List of other physicians Hospitalizations, surgeries, and ER visits in previous 12 months Vitals Screenings to include cognitive, depression, and falls Referrals and appointments  In addition, I have reviewed and discussed with patient certain preventive protocols, quality metrics, and best practice recommendations. A written personalized care plan for preventive services as well as general  preventive health recommendations were provided to patient.     Araceli Zelda, CMA   06/15/2024   After Visit Summary: (In Person-Printed) AVS printed and given to the patient  I, Ronal JINNY Hailstone, MD, have reviewed all documentation for this visit. The documentation on 06/15/2024 for the exam, diagnosis, procedures, and orders are all accurate and complete.   I have reviewed and agree with the above Annual Wellness Visit documentation.  Ronal Norleen Hailstone, MD. Internal Medicine 06/15/2024

## 2024-06-15 NOTE — Patient Instructions (Addendum)
 Next appointment: Follow up in one year for your annual wellness visit.   Preventive Care 24 Years and Older, Male Preventive care refers to lifestyle choices and visits with your health care provider that can promote health and wellness. What does preventive care include? A yearly physical exam. This is also called an annual well check. Dental exams once or twice a year. Routine eye exams. Ask your health care provider how often you should have your eyes checked. Personal lifestyle choices, including: Daily care of your teeth and gums. Regular physical activity. Eating a healthy diet. Avoiding tobacco and drug use. Limiting alcohol use. Practicing safe sex. Taking low doses of aspirin  every day. Taking vitamin and mineral supplements as recommended by your health care provider. What happens during an annual well check? The services and screenings done by your health care provider during your annual well check will depend on your age, overall health, lifestyle risk factors, and family history of disease. Counseling  Your health care provider may ask you questions about your: Alcohol use. Tobacco use. Drug use. Emotional well-being. Home and relationship well-being. Sexual activity. Eating habits. History of falls. Memory and ability to understand (cognition). Work and work Astronomer. Screening  You may have the following tests or measurements: Height, weight, and BMI. Blood pressure. Lipid and cholesterol levels. These may be checked every 5 years, or more frequently if you are over 61 years old. Skin check. Lung cancer screening. You may have this screening every year starting at age 56 if you have a 30-pack-year history of smoking and currently smoke or have quit within the past 15 years. Fecal occult blood test (FOBT) of the stool. You may have this test every year starting at age 61. Flexible sigmoidoscopy or colonoscopy. You may have a sigmoidoscopy every 5 years or  a colonoscopy every 10 years starting at age 33. Prostate cancer screening. Recommendations will vary depending on your family history and other risks. Hepatitis C blood test. Hepatitis B blood test. Sexually transmitted disease (STD) testing. Diabetes screening. This is done by checking your blood sugar (glucose) after you have not eaten for a while (fasting). You may have this done every 1-3 years. Abdominal aortic aneurysm (AAA) screening. You may need this if you are a current or former smoker. Osteoporosis. You may be screened starting at age 19 if you are at high risk. Talk with your health care provider about your test results, treatment options, and if necessary, the need for more tests. Vaccines  Your health care provider may recommend certain vaccines, such as: Influenza vaccine. This is recommended every year. Tetanus, diphtheria, and acellular pertussis (Tdap, Td) vaccine. You may need a Td booster every 10 years. Zoster vaccine. You may need this after age 56. Pneumococcal 13-valent conjugate (PCV13) vaccine. One dose is recommended after age 65. Pneumococcal polysaccharide (PPSV23) vaccine. One dose is recommended after age 74. Talk to your health care provider about which screenings and vaccines you need and how often you need them. This information is not intended to replace advice given to you by your health care provider. Make sure you discuss any questions you have with your health care provider. Document Released: 10/14/2015 Document Revised: 06/06/2016 Document Reviewed: 07/19/2015 Elsevier Interactive Patient Education  2017 [employer].  Fall Prevention in the Home Falls can cause injuries. They can happen to people of all ages. There are many things you can do to make your home safe and to help prevent falls. What can I do  on the outside of my home? Regularly fix the edges of walkways and driveways and fix any cracks. Remove anything that might make you trip as you  walk through a door, such as a raised step or threshold. Trim any bushes or trees on the path to your home. Use bright outdoor lighting. Clear any walking paths of anything that might make someone trip, such as rocks or tools. Regularly check to see if handrails are loose or broken. Make sure that both sides of any steps have handrails. Any raised decks and porches should have guardrails on the edges. Have any leaves, snow, or ice cleared regularly. Use sand or salt on walking paths during winter. Clean up any spills in your garage right away. This includes oil or grease spills. What can I do in the bathroom? Use night lights. Install grab bars by the toilet and in the tub and shower. Do not use towel bars as grab bars. Use non-skid mats or decals in the tub or shower. If you need to sit down in the shower, use a plastic, non-slip stool. Keep the floor dry. Clean up any water  that spills on the floor as soon as it happens. Remove soap buildup in the tub or shower regularly. Attach bath mats securely with double-sided non-slip rug tape. Do not have throw rugs and other things on the floor that can make you trip. What can I do in the bedroom? Use night lights. Make sure that you have a light by your bed that is easy to reach. Do not use any sheets or blankets that are too big for your bed. They should not hang down onto the floor. Have a firm chair that has side arms. You can use this for support while you get dressed. Do not have throw rugs and other things on the floor that can make you trip. What can I do in the kitchen? Clean up any spills right away. Avoid walking on wet floors. Keep items that you use a lot in easy-to-reach places. If you need to reach something above you, use a strong step stool that has a grab bar. Keep electrical cords out of the way. Do not use floor polish or wax that makes floors slippery. If you must use wax, use non-skid floor wax. Do not have throw rugs  and other things on the floor that can make you trip. What can I do with my stairs? Do not leave any items on the stairs. Make sure that there are handrails on both sides of the stairs and use them. Fix handrails that are broken or loose. Make sure that handrails are as long as the stairways. Check any carpeting to make sure that it is firmly attached to the stairs. Fix any carpet that is loose or worn. Avoid having throw rugs at the top or bottom of the stairs. If you do have throw rugs, attach them to the floor with carpet tape. Make sure that you have a light switch at the top of the stairs and the bottom of the stairs. If you do not have them, ask someone to add them for you. What else can I do to help prevent falls? Wear shoes that: Do not have high heels. Have rubber bottoms. Are comfortable and fit you well. Are closed at the toe. Do not wear sandals. If you use a stepladder: Make sure that it is fully opened. Do not climb a closed stepladder. Make sure that both sides of the stepladder are  locked into place. Ask someone to hold it for you, if possible. Clearly mark and make sure that you can see: Any grab bars or handrails. First and last steps. Where the edge of each step is. Use tools that help you move around (mobility aids) if they are needed. These include: Canes. Walkers. Scooters. Crutches. Turn on the lights when you go into a dark area. Replace any light bulbs as soon as they burn out. Set up your furniture so you have a clear path. Avoid moving your furniture around. If any of your floors are uneven, fix them. If there are any pets around you, be aware of where they are. Review your medicines with your doctor. Some medicines can make you feel dizzy. This can increase your chance of falling. Ask your doctor what other things that you can do to help prevent falls. This information is not intended to replace advice given to you by your health care provider. Make sure  you discuss any questions you have with your health care provider. Document Released: 07/14/2009 Document Revised: 02/23/2016 Document Reviewed: 10/22/2014 Elsevier Interactive Patient Education  2017 [employer].  Please continue close follow up with Specialists at [facility] and also Dr. Court. Please ask Oncologist about Flu and Covid vaccines.

## 2024-06-15 NOTE — Progress Notes (Signed)
 Annual Wellness Visit   Patient Care Team: Lorice Lafave, Ronal PARAS, MD as PCP - General (Internal Medicine) Court Dorn PARAS, MD as PCP - Cardiology (Cardiology) Belinda Cough, MD as Consulting Physician (General Surgery) Livingston Rigg, MD as Consulting Physician (Dermatology)  Visit Date: 06/15/24   Chief Complaint  Patient presents with   Medicare Wellness   Annual Exam   Subjective:  Patient: [name], Male DOB: 1950-12-18, 73 y.o. MRN: [id number] Vitals:   06/15/24 1452  BP: 110/60   [name] is a 73 y.o. Male who presents today for his Annual Wellness Visit. Patient has Coronary artery disease; Squamous cell carcinoma of tongue (HCC); Hypertension; Depression; GE reflux; Internal carotid artery stenosis; Ptosis of eyelid, left; Hyperlipidemia; History of ischemic vertebrobasilar artery thalamic stroke; Attention deficit disorder; Degenerative joint disease of cervical spine; Erectile dysfunction; Anxiety; Back pain; Cervical disc disease; Hypothyroidism; Carotid artery disease (HCC); Abnormal stress test; CAD (coronary artery disease); S/P angioplasty with stent 06/14/16 PCI & DES of high grade AV groove LCX and 1st OM ; Right lumbar radiculopathy; Elevated PSA; Lumbar disc herniation; Former smoker; SOB (shortness of breath); Carotid stenosis, right; and Palpitations on their problem list.    Recently had upper endoscopy with balloon dilatation of esophagus  on Sept 17th at [facility] for  progressive dysphagia.Was having difficulty swallowing liquids.  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  He had total laryngectomy and cervical esophagectomy with partial thyroidectomy and bilateral neck dissection at that facility.  In January of this year, MRI demonstrated no evidence of residual disease.  Recently was referred back to interventional radiology because of 2 lesions in his liver in MRI in June which  were suspicious for malignancy.    History of Cholangiocarcinoma with mets to his liver and lungs being treated with chemotherapy at [facility]. 06/10/24 MRI revealed increased size of several hepatic lesions. No definite new lesions.   History of Hyperlipidemia treated with repatha  140 mg every 14 days. Lipid Panel WNL   History of Hypertension treated with amlodipine  2.5 mg daily, Metorprolol tartrate 12.5 mg twice daily. Blood pressure today is normal at 110/60   History of ADD treated with Adderall 20 mg three times daily    History of Anxiety and depression treated with bupropion  300 mg in the morning. Sertraline  200 mg  daily and lorazepam  1 mg two or three times daily as needed    History of Hypothyroidsim treated with levothyroxine  100 mcg daily    History of asthma treasted with albuterol  inhaler as needed   He is followed at [facility] for history of prostate cancer.  He had radical assisted robotic prostatectomy November 2021.  He completed radiation treatments.  His PSA was in the 3-4 range but increased to 6.6 in March 2020.  Subsequently PSA increased to 13.25 in October 2021 and a biopsy done confirmed adenocarcinoma of the prostate with Gleason score of 7.   History of right carotid endarterectomy.  Dr. Dorn Court is his Cardiologist.  He has a history of coronary artery disease.  History of hyperlipidemia but is statin intolerant.  History of low pressure differential in each arm due to subclavian stenosis.  History of hypertension.   History of issues with swallowing in May 2019 and underwent colonoscopy and upper endoscopy.  He had 3 small sessile polyps on colonoscopy.  He was having some dysphagia and had  history of GE reflux.  He had normal stomach and normal duodenum on endoscopy.  There were esophageal mucosal changes but no evidence of cancer or eosinophilic esophagitis.  Biopsies were consistent with esophagitis  and polyps were hyperplastic.   He has seen Dr. Thressa, Pulmonologist, in December 2020 for shortness of breath and was treated with an inhaler   He underwent L2-L3 discectomy September 2020 by Dr. Burnetta for severe intractable low back pain.   History of White blood cell count in the 3700 range which has been followed for several years and is asymptomatic.   History of neck pain previously seen by Dr. Unice.  Had numbness of left face but not neck with intermittent paresthesias down left arm.  He had MRI in late 2021 of C-spine showing significant facet arthropathy on the left at C2-C3 and C3-C4.  There was a fluid cyst in the left C3-C4 facet joint and mild degenerative changes on the right at C6-C7.  Dr. Unice believed this to be the basis for his left-sided occipital neck and jaw pain.  He recommended facet blocks at C2-C3 and C3-C4 levels on the left.   History of CABG x5 in 2009.  Remote history of smoking, history of left Horner syndrome, history of stroke, cervical disc disease, squamous cell carcinoma of the tongue, external carotid artery stenosis on the left, history of migraine headaches, allergic rhinitis, and history of back pain.   In December 2017 he had PCI and drug-eluting stent placement of a high-grade circumflex and first obtuse marginal branch stenosis in the setting of an occluded sequential vein graft with patent continuation of the OM1-OM 2   History of 70% right subclavian artery stenosis.  Percutaneous intervention was considered but has not been done.   He has a 50 mm upper extremity blood pressure differential due to right subclavian and/or innominate stenosis.    He is on chronic anticoagulation with Plavix .   History of left shoulder arthroscopy with subacromial decompression and open rotator cuff repair by Dr. Kay in 2014.  Open distal clavicle resection and open biceps tendon diathesis.   Intolerant of statins.  Dr. Court has treated him with Praluent   injections every 14 days.   History of GE reflux treated with Protonix .   In January 2018 he was seen in the emergency department with palpitations, diaphoresis and lightheadedness.  D-dimer was elevated at 0.75.  CT angio of the chest with contrast showed no evidence of PE.  He was thought not to have had an MI and was discharged home.  CT angios of chest and aorta with and without contrast showed 70% proximal right subclavian artery stenosis.  History of mild plaque in left subclavian artery.  History of mild plaque in distal right subclavian artery.   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  He had discectomy of the C-spine around 2000.   He developed left eye ptosis after cardiac surgery.  Says his face feels numb when he turns his head to the left.   Had left thalamic stroke in 2005.  History of numbness of the left cheek in the C3 distribution.  Documented facet disease on the left at C2-C3, C4-C5, C5-C6 as well as C6-C7 which is aggravated by turning his head.   History of GE reflux.   Says he has remote history of viral hepatitis a number of years ago.   Patient says his neurologist, Dr. Maurice, who is now retired felt his carotid disease was due to radiation  therapy.   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 10/19/2022 Colonoscopy 2 subcentimeter ascending colon polyps s/p polypectomy. Few sigmoid diverticula, otherwise normal. Repeat in 7-10 years   04/21/2024 Echocardiogram There is mild concentric left ventricular hypertrophy. Left ventricular diastolic parameters are consistent with Grade I diastolic dysfunction ( impaired relaxation). Left atrial size was mildly dilated. tricuspid. There is mild calcification of the aortic valve. There is mild thickening of the aortic valve. Aortic valve regurgitation is mild. Aortic valve sclerosis/ calcification is present, without any evidence of aortic stenosis.  Labs  06/12/2024 Sodium 148  Total protein 5.6   RBC 3.19 Hemoglobin 10.0 HCT 31.6 MCHC 31.6  RDW 16.0  Platelets 137  Absolute lymphocytes 554   Otherwise WNL    Vaccine counseling: Discuss with oncologist if he is able to to receive Influenza and covid vaccine   Health Maintenance  Topic Date Due   Influenza Vaccine  05/01/2024   COVID-19 Vaccine (7 - Pfizer risk 2024-25 season) 06/01/2024   Pneumococcal Vaccine: 50+ Years (1 of 2 - PCV) 06/30/2024 (Originally 10/04/1969)   Medicare Annual Wellness (AWV)  06/15/2025   Colonoscopy  10/19/2032   Hepatitis C Screening  Completed   Zoster Vaccines- Shingrix   Completed   HPV VACCINES  Aged Out   Meningococcal B Vaccine  Aged Out   DTaP/Tdap/Td  Discontinued      Review of Systems  Constitutional:  Negative for fever and malaise/fatigue.  HENT:  Negative for congestion.   Eyes:  Negative for blurred vision.  Respiratory:  Negative for cough and shortness of breath.   Cardiovascular:  Negative for chest pain, palpitations and leg swelling.  Gastrointestinal:  Negative for vomiting.  Musculoskeletal:  Negative for back pain.  Skin:  Negative for rash.  Neurological:  Negative for loss of consciousness and headaches.   Objective:  Vitals: body mass index is 22.55 kg/m. Today's Vitals   06/15/24 1452  BP: 110/60  Weight: 144 lb (65.3 kg)  Height: 5' 7 (1.702 m)  PainSc: 5   PainLoc: Shoulder   Physical Exam Vitals and nursing note reviewed. Exam conducted with a chaperone present.  Constitutional:      General: He is awake. He is not in acute distress.    Appearance: Normal appearance. He is not ill-appearing or toxic-appearing.  HENT:     Head: Normocephalic and atraumatic.     Right Ear: Tympanic membrane, ear canal and external ear normal.     Left Ear: Tympanic membrane, ear canal and external ear normal.     Mouth/Throat:     Pharynx: Oropharynx is clear.  Eyes:     Extraocular Movements: Extraocular movements  intact.     Pupils: Pupils are equal, round, and reactive to light.  Neck:     Thyroid : No thyroid  mass, thyromegaly or thyroid  tenderness.     Vascular: No carotid bruit.  Cardiovascular:     Rate and Rhythm: Normal rate and regular rhythm. No extrasystoles are present.    Pulses:          Dorsalis pedis pulses are 2+ on the right side and 2+ on the left side.       Posterior tibial pulses are 2+ on the right side and 2+ on the left side.     Heart sounds: Normal heart sounds. No murmur heard.    No friction rub. No gallop.  Pulmonary:     Effort: Pulmonary effort is normal.  Breath sounds: Normal breath sounds. No decreased breath sounds, wheezing, rhonchi or rales.  Chest:     Chest wall: No mass.  Abdominal:     Palpations: Abdomen is soft. There is no hepatomegaly, splenomegaly or mass.     Tenderness: There is no abdominal tenderness.     Hernia: No hernia is present.  Genitourinary:    Prostate: Normal. Not enlarged, not tender and no nodules present.     Rectum: Normal. Guaiac result negative.  Musculoskeletal:     Cervical back: Normal range of motion.     Right lower leg: No edema.     Left lower leg: No edema.  Lymphadenopathy:     Cervical: No cervical adenopathy.     Upper Body:     Right upper body: No supraclavicular adenopathy.     Left upper body: No supraclavicular adenopathy.  Skin:    General: Skin is warm and dry.  Neurological:     General: No focal deficit present.     Mental Status: He is alert and oriented to person, place, and time. Mental status is at baseline.     Cranial Nerves: Cranial nerves 2-12 are intact.     Sensory: Sensation is intact.     Motor: Motor function is intact.     Coordination: Coordination is intact.     Gait: Gait is intact.     Deep Tendon Reflexes: Reflexes are normal and symmetric.  Psychiatric:        Attention and Perception: Attention normal.        Mood and Affect: Mood normal.        Speech: Speech normal.         Behavior: Behavior normal. Behavior is cooperative.        Thought Content: Thought content normal.        Cognition and Memory: Cognition and memory normal.        Judgment: Judgment normal.     Current Outpatient Medications  Medication Instructions   acetaminophen  (TYLENOL ) 500 mg, Every 6 hours PRN   albuterol  (VENTOLIN  HFA) 108 (90 Base) MCG/ACT inhaler 2 puffs, Inhalation, Every 6 hours PRN   albuterol  (VENTOLIN  HFA) 108 (90 Base) MCG/ACT inhaler Inhale 2 puffs into the lungs every 6 (six) hours as needed for wheezing or shortness of breath..   amitriptyline  (ELAVIL ) 10 mg, Oral, Daily at bedtime   amphetamine -dextroamphetamine  (ADDERALL) 20 MG tablet 20 mg, Oral, 3 times daily, Take in the morning, at noon and at 4pm.   amphetamine -dextroamphetamine  (ADDERALL) 20 MG tablet 20 mg, Oral, 3 times daily   aspirin  EC 81 mg, Daily   buPROPion  (WELLBUTRIN  XL) 300 mg, Oral, Every morning   Cyanocobalamin 2,500 mcg, Weekly   dexamethasone  (DECADRON ) 4 MG tablet Take 1 tablet (4 mg total) by mouth in the morning AND 1 tablet (4 mg total) every evening. Take with meals. Take prescribed dose on the 3 days AFTER chemotherapy infusion treatment.   levothyroxine  (SYNTHROID ) 150 mcg, Oral, Daily   lidocaine -prilocaine  (EMLA ) cream Apply topically as needed (apply to port site).   LORazepam  (ATIVAN ) 1 MG tablet Take 1 tablet (1 mg total) by mouth 2 to 3 times a day as needed. ( no more than 2.5 per day)   LORazepam  (ATIVAN ) 1 MG tablet Take 1 tablet (1 mg total) by mouth 2-3 times daily as needed. No more than 2.5 mg daily.   metoprolol  tartrate (LOPRESSOR ) 12.5 mg, Oral, 2 times daily   ondansetron  (ZOFRAN ) 8  MG tablet Take 1 tablet (8 mg total) by mouth every 8 (eight) hours as needed for nausea or vomiting.   ondansetron  (ZOFRAN -ODT) 4 mg, Every 8 hours PRN   oxyCODONE  (OXY IR/ROXICODONE ) 5 MG immediate release tablet Take 1 tablet (5 mg total) by mouth every 4 (four) hours as needed for  moderate pain (4-6).   pantoprazole  (PROTONIX ) 40 mg, Oral, Daily   Qulipta  60 mg, Oral, Daily   Repatha  SureClick 140 mg, Subcutaneous, Every 14 days   rizatriptan  (MAXALT ) 10 mg, Oral, As needed, May repeat in 2 hours if needed   sertraline  (ZOLOFT ) 100 mg, Oral, Daily   sertraline  (ZOLOFT ) 200 mg, Oral, Daily   Ubrogepant  (UBRELVY ) 100 MG TABS Take 1 tablet at the onset of migraine. May repeat dose once in 2 hours if no relief. Do not exceed 2 doses in 24 hours.   Past Medical History:  Diagnosis Date   ADHD (attention deficit hyperactivity disorder)    Anxiety    Arthritis    NECK, HANDS, KNEES (06/14/2016)   Atypical mole 08/07/2021   Mid Back (Atypical Proliferation)   CAD (coronary artery disease)    Carotid artery stenosis    Complication of anesthesia    Reports that he had trouble swallowing post anesthesia- CABG   COPD (chronic obstructive pulmonary disease) (HCC) 12/11/2011   r/s mv - EF 72%; exercise capacity 13 METS; no exercised induced ischemic EKG changes   Depression, major    Dyspnea    with exertion   GERD (gastroesophageal reflux disease)    HTN (hypertension), benign 04/16/2008   echo - EF >55%; no regional wall or valvular abnormalities   Hyperlipidemia    statin intolerant   Hypothyroidism    Migraine    crippling headaches as a kid; silent migraines now, no pain, couple times/week (06/14/2016)   Peripheral vascular disease (HCC)    Prostate cancer (HCC) 04/2019   recent dx - Dr Renay Moats   S/P angioplasty with stent 06/14/16 PCI & DES of high grade AV groove LCX and 1st OM  06/15/2016   Squamous cell cancer of tongue (HCC) ~ 2002   [facility] at [facility]   Stroke [facility])    hx Thalamic Ischemic CVA   Subclavian steal syndrome 06/17/2012   carotid doppler - R systolic brachial pressure , L ; R subclavian artery - proxmial obsstruction w/ abnormal monophasic waveforms, R ECA known occlusive disease; L ECA narrowing w/  70-99% diameter reductiona   TIA (transient ischemic attack) 2005   Viral hepatitis 1970s   non specific   Medical/Surgical History Narrative:  Allergic/Intolerant to:  Allergies  Allergen Reactions   Crestor [Rosuvastatin Calcium] Other (See Comments)    Difficulty swallowing   Tramadol  Shortness Of Breath   Lisinopril  Cough   Gabapentin  Other (See Comments)    Hallucinations   Tricor [Fenofibrate] Other (See Comments)    UNSPECIFIED REACTION   Lipitor [Atorvastatin Calcium] Other (See Comments)    Muscle spasms    Past Surgical History:  Procedure Laterality Date   BACK SURGERY     BIOPSY  02/20/2018   Procedure: BIOPSY;  Surgeon: Kristie Lamprey, MD;  Location: [facility];  Service: Endoscopy;;   BIOPSY TONGUE  ~ 2002   base of tongue and uvula   CARDIAC CATHETERIZATION  04/26/2008   L circumflex 99% stenosed midportion straddling a marginal branch; RCA total priximally w/ grade 2 L to R collaterals; renal arteries widely patent; LAD 30% segmental proximal  stenosis   CARDIAC CATHETERIZATION N/A 05/14/2016   Procedure: Left Heart Cath and Cors/Grafts Angiography;  Surgeon: Dorn JINNY Lesches, MD;  Location: [facility] INVASIVE CV LAB;  Service: Cardiovascular;  Laterality: N/A;   CARDIAC CATHETERIZATION N/A 06/14/2016   Procedure: Coronary Stent Intervention;  Surgeon: Dorn JINNY Lesches, MD;  Location: [facility] INVASIVE CV LAB;  Service: Cardiovascular;  Laterality: N/A;   CERVICAL DISC SURGERY  2000   Kritzer    Posterior approach   COLONOSCOPY WITH PROPOFOL  N/A 02/20/2018   Procedure: COLONOSCOPY WITH PROPOFOL ;  Surgeon: Kristie Lamprey, MD;  Location: [facility];  Service: Endoscopy;  Laterality: N/A;   CORONARY ANGIOPLASTY WITH STENT PLACEMENT     CORONARY ARTERY BYPASS GRAFT  2009   x5; LIMA to LAd, sequential vein to first diagonal branch, first and second obtuse marginal branches; vein to PDA   ESOPHAGOGASTRODUODENOSCOPY (EGD) WITH PROPOFOL  N/A 02/20/2018   Procedure:  ESOPHAGOGASTRODUODENOSCOPY (EGD) WITH PROPOFOL ;  Surgeon: Kristie Lamprey, MD;  Location: [facility];  Service: Endoscopy;  Laterality: N/A;   INSERTION OF MESH N/A 10/29/2017   Procedure: INSERTION OF MESH;  Surgeon: Belinda Cough, MD;  Location: [facility] OR;  Service: General;  Laterality: N/A;   LUMBAR DISC SURGERY  06/17/2019   LUMBAR 2 & 3    LUMBAR LAMINECTOMY/DECOMPRESSION MICRODISCECTOMY N/A 06/17/2019   Procedure: Lumbar two -Lumbar three decompression and disectomy;  Surgeon: Burnetta Aures, MD;  Location: [facility] OR;  Service: Orthopedics;  Laterality: N/A;  3 hrs   POLYPECTOMY  02/20/2018   Procedure: POLYPECTOMY;  Surgeon: Kristie Lamprey, MD;  Location: [facility];  Service: Endoscopy;;   REVERSE SHOULDER ARTHROPLASTY Right 09/06/2023   Procedure: REVERSE SHOULDER ARTHROPLASTY;  Surgeon: Kay Kemps, MD;  Location: [facility] ORS;  Service: Orthopedics;  Laterality: Right;  interscalene  100   SHOULDER ARTHROSCOPY WITH SUBACROMIAL DECOMPRESSION AND OPEN ROTATOR C Left 09/28/2013   Procedure: LEFT SHOULDER ARTHROSCOPY WITH SUBACROMIAL DECOMPRESSION AND MINI OPEN ROTATOR CUFF REPAIR, OPEN DISTAL CLAVICLE RESECTION AND OPEN BICEP TENDODESIS ;  Surgeon: Elspeth JONELLE Kay, MD;  Location: [facility] OR;  Service: Orthopedics;  Laterality: Left;   TRANSCAROTID ARTERY REVASCULARIZATION  Right 05/05/2021   Procedure: RIGHT TRANSCAROTID ARTERY REVASCULARIZATION;  Surgeon: Magda Debby SAILOR, MD;  Location: [facility] OR;  Service: Vascular;  Laterality: Right;   UMBILICAL HERNIA REPAIR N/A 10/29/2017   Procedure: UMBILICAL HERNIA REPAIR;  Surgeon: Belinda Cough, MD;  Location: [facility] OR;  Service: General;  Laterality: N/A;   Family History  Problem Relation Age of Onset   Cancer Mother        ovarian   Stroke Father    Diabetes Father    Arthritis Father    Hypertension Brother    Most Recent Health Risks Assessment:   Medicare Risk at Home - 06/15/24 1459     Any stairs in or around the home? Yes    If so, are there any  without handrails? No    Home free of loose throw rugs in walkways, pet beds, electrical cords, etc? Yes    Adequate lighting in your home to reduce risk of falls? Yes    Life alert? No    Use of a cane, walker or w/c? No    Grab bars in the bathroom? No    Shower chair or bench in shower? No    Elevated toilet seat or a handicapped toilet? No         Most Recent Social Determinants of Health (Including Hx of Tobacco, Alcohol, and Drug Use)  SDOH Screenings   Food Insecurity: No Food Insecurity (06/14/2024)  Housing: Low Risk  (06/14/2024)  Transportation Needs: No Transportation Needs (06/15/2024)  Utilities: Not At Risk (06/15/2024)  Alcohol Screen: Low Risk  (06/15/2024)  Depression (PHQ2-9): High Risk (06/15/2024)  Financial Resource Strain: Low Risk  (06/15/2024)  Physical Activity: Insufficiently Active (06/15/2024)  Social Connections: Moderately Isolated (06/15/2024)  Stress: Stress Concern Present (06/15/2024)  Tobacco Use: Medium Risk (06/15/2024)  Health Literacy: Adequate Health Literacy (06/15/2024)   Social History   Tobacco Use   Smoking status: Former    Current packs/day: 0.00    Average packs/day: 3.0 packs/day for 30.0 years (90.0 ttl pk-yrs)    Types: Cigarettes    Start date: 01/30/1971    Quit date: 01/29/2001    Years since quitting: 23.3   Smokeless tobacco: Never  Vaping Use   Vaping status: Never Used  Substance Use Topics   Alcohol use: Yes    Alcohol/week: 2.0 standard drinks of alcohol    Types: 2 Cans of beer per week    Comment: 2-3 beers per week   Drug use: No   Most Recent Functional Status Assessment:    06/15/2024    2:56 PM  In your present state of health, do you have any difficulty performing the following activities:  Hearing? 0  Vision? 1  Difficulty concentrating or making decisions? 0  Walking or climbing stairs? 0  Dressing or bathing? 0  Doing errands, shopping? 1  Preparing Food and eating ? Y  Using the Toilet? N  In the past  six months, have you accidently leaked urine? N  Do you have problems with loss of bowel control? N  Managing your Medications? N  Managing your Finances? N  Housekeeping or managing your Housekeeping? N   Most Recent Fall Risk Assessment:    06/15/2024    3:00 PM  Fall Risk   Falls in the past year? 1  Number falls in past yr: 1  Injury with Fall? 0  Risk for fall due to : Impaired balance/gait  Follow up Falls prevention discussed;Education provided;Falls evaluation completed   Most Recent Anxiety/Depression Screenings:    06/15/2024    3:07 PM 01/31/2023   10:38 AM  PHQ 2/9 Scores  PHQ - 2 Score 5 4  PHQ- 9 Score 21 12      06/15/2024    3:08 PM  GAD 7 : Generalized Anxiety Score  Nervous, Anxious, on Edge 2  Control/stop worrying 3  Worry too much - different things 3  Trouble relaxing 2  Restless 2  Easily annoyed or irritable 3  Afraid - awful might happen 2  Total GAD 7 Score 17  Anxiety Difficulty Very difficult   Most Recent Cognitive Screening:    06/15/2024    2:59 PM  6CIT Screen  What Year? 0 points  What month? 0 points  What time? 0 points  Count back from 20 0 points  Months in reverse 0 points  Repeat phrase 0 points  Total Score 0 points   Most Recent Vision/Hearing Screenings:No results found. Results:  Studies Obtained And Personally Reviewed By Me: Diabetic Foot Exam - Simple   No data filed     10/19/2022 Colonoscopy 2 subcentimeter ascending colon polyps s/p polypectomy. Few sigmoid diverticula, otherwise normal. Repeat in 7-10 years     04/21/2024 Echocardiogram There is mild concentric left ventricular hypertrophy. Left ventricular diastolic parameters are consistent with Grade I diastolic dysfunction ( impaired relaxation). Left  atrial size was mildly dilated. tricuspid. There is mild calcification of the aortic valve. There is mild thickening of the aortic valve. Aortic valve regurgitation is mild. Aortic valve sclerosis/  calcification is present, without any evidence of aortic stenosis.  Labs:  CBC w/ Differential Lab Results  Component Value Date   WBC 3.9 06/12/2024   RBC 3.19 (L) 06/12/2024   HGB 10.0 (L) 06/12/2024   HCT 31.6 (L) 06/12/2024   PLT 137 (L) 06/12/2024   MCV 99.1 06/12/2024   MCH 31.3 06/12/2024   MCHC 31.6 (L) 06/12/2024   RDW 16.0 (H) 06/12/2024   MPV 10.1 06/12/2024   LYMPHSABS 398 (L) 06/11/2023   MONOABS 320 01/01/2017   BASOSABS 31 06/12/2024    Comprehensive Metabolic Panel Lab Results  Component Value Date   NA 148 (H) 06/12/2024   K 3.8 06/12/2024   CL 109 06/12/2024   CO2 29 06/12/2024   GLUCOSE 93 06/12/2024   BUN 21 06/12/2024   CREATININE 1.01 06/12/2024   CALCIUM 8.8 06/12/2024   PROT 5.6 (L) 06/12/2024   ALBUMIN  3.6 05/05/2021   AST 21 06/12/2024   ALT 30 06/12/2024   ALKPHOS 57 05/05/2021   BILITOT 0.4 06/12/2024   EGFR 79 06/12/2024   GFRNONAA 53 (L) 08/27/2023   Lipid Panel  Lab Results  Component Value Date   CHOL 142 06/12/2024   HDL 74 06/12/2024   LDLCALC 46 06/12/2024   TRIG 139 06/12/2024   A1c Lab Results  Component Value Date   HGBA1C 5.5 01/01/2017    TSH Lab Results  Component Value Date   TSH 0.68 04/23/2024    Assessment & Plan:   Orders Placed This Encounter  Procedures   B12 and Folate Panel   Iron, TIBC and Ferritin Panel   Reticulocytes   Meds ordered this encounter  Medications   rizatriptan  (MAXALT ) 10 MG tablet    Sig: Take 1 tablet (10 mg total) by mouth as needed for migraine. May repeat in 2 hours if needed    Dispense:  10 tablet    Refill:  0   amitriptyline  (ELAVIL ) 10 MG tablet    Sig: Take 1 tablet (10 mg total) by mouth at bedtime.    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 06/10/24 MRI revealed increased size of several hepatic lesions. No definite new lesions.  Tumor was injected with Palonosetron, fluorouracil,oxaliplatin, and leucovorin.  Hyperlipidemia: treated with Repatha  140 mg every 14 days. Lipid Panel WNL   Hypertension: treated with amlodipine  2.5 mg daily, Metoprolol  tartrate 12.5 mg twice daily. Blood pressure today is normal at 110/60   History of ADD treated with Adderall 20 mg Three times daily    Anxiety and depression: treated with Bupropion  300 mg in the morning. 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  He had total laryngectomy and cervical esophagectomy with partial thyroidectomy and bilateral neck dissection at that facility.  In January of this year, MRI demonstrated no evidence of residual disease.    Recently was referred back to Interventional Radiology because of  2 lesions in his liver which were suspicious for malignancy.  He underwent uncomplicated CT-guided microwave ablation of 2 separate lesions May 03, 2023.     He is followed at [facility] for history of prostate cancer.  He had radical assisted robotic prostatectomy November 2021.  He completed radiation treatments.  His PSA was in the 3-4 range but increased to 6.6 in March 2020.  Subsequently PSA increased to 13.25 in October 2021 and a biopsy done confirmed adenocarcinoma of the prostate with Gleason score of 7.   History of right carotid endarterectomy.      History of issues with swallowing in May 2019 and underwent colonoscopy and upper endoscopy.  He had 3 small sessile polyps on colonoscopy.  He was having some dysphagia and had history of GE reflux.  He had normal stomach and normal duodenum on endoscopy.  There were esophageal mucosal changes but no evidence of  cancer or eosinophilic esophagitis.  Biopsies were consistent with esophagitis and polyps were hyperplastic.   He has seen Dr. Thressa, Pulmonologist, in December 2020 for shortness of breath and was treated with an inhaler   He underwent L2-L3 discectomy September 2020 by Dr. Burnetta for severe intractable low back pain.   History of white blood cell count in the 3700 range which has been followed for several years and is asymptomatic.   History of neck pain previously seen by Dr. Unice.  Had numbness of left face but not neck with intermittent paresthesias down left arm.  He had MRI in late 2021 of C-spine showing significant facet arthropathy on the left at C2-C3 and C3-C4.  There was a fluid cyst in the left C3-C4 facet joint and mild degenerative changes on the right at C6-C7.  Dr. Unice believed this to be the basis for his left-sided occipital neck and jaw pain.  He recommended facet blocks at C2-C3 and C3-C4 levels on the left.   History of CABG x5 in 2009.      In December 2017 he had PCI and drug-eluting stent placement of a high-grade circumflex and first obtuse marginal branch stenosis in the setting of an occluded sequential vein graft with patent continuation of the OM1-OM 2   History of 70% right subclavian artery stenosis.      He has a 50 mm upper extremity blood pressure differential due to right subclavian and/or innominate stenosis.    He is on chronic anticoagulation with Plavix .   History of left shoulder arthroscopy with subacromial decompression and open rotator cuff repair by Dr. Kay in 2014.  Open distal clavicle resection and open biceps tendon diathesis.     History of GE reflux treated with Protonix .   In January 2018 he was seen in the emergency department with palpitations, diaphoresis and lightheadedness.  D-dimer was elevated at 0.75.  CT angio of the chest with contrast showed no evidence of PE.  He was thought not to have had an MI and was discharged home.   CT angios of chest and aorta with and without contrast showed 70% proximal right subclavian artery stenosis.  History of mild plaque in left subclavian artery.  History of mild plaque in distal right subclavian artery.   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  He had discectomy of the C-spine around 2000.   He developed left eye ptosis after cardiac surgery.  Says his face feels numb when he turns his head to the left.   Had left thalamic  stroke in 2005.  History of numbness of the left cheek in the C3 distribution.  Documented facet disease on the left at C2-C3, C4-C5, C5-C6 as well as C6-C7 which is aggravated by turning his head.   History of GE reflux.   Says he has remote history of viral hepatitis a number of years ago.   Patient says his neurologist, Dr. Maurice, who is now retired felt his carotid disease was due to radiation therapy.   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   10/19/2022 Colonoscopy 2 subcentimeter ascending colon polyps s/p polypectomy. Few sigmoid diverticula, otherwise normal. Repeat in 7-10 years     04/21/2024 Echocardiogram There is mild concentric left ventricular hypertrophy. Left ventricular diastolic parameters are consistent with Grade I diastolic dysfunction ( impaired relaxation). Left atrial size was mildly dilated. tricuspid. There is mild calcification of the aortic valve. There is mild thickening of the aortic valve. Aortic valve regurgitation is mild. Aortic valve sclerosis/ calcification is present, without any evidence of aortic stenosis.  Labs 06/12/2024 Sodium 148  Total protein 5.6   RBC 3.19 Hemoglobin 10.0 HCT 31.6 MCHC 31.6  RDW 16.0  Platelets 137  Absolute lymphocytes 554   Otherwise WNL    Vaccine counseling: Discuss with oncologist if he is able to to receive Influenza and Covid vaccines      Annual Wellness Visit done today including the all of the  following: Reviewed patient's Family Medical History Reviewed patient's SDOH and reviewed tobacco, alcohol, and drug use.  Reviewed and updated list of patient's medical providers Assessment of cognitive impairment was done Assessed patient's functional ability Established a written schedule for health screening services Health Risk Assessent Completed and Reviewed  Discussed health benefits of physical activity, and encouraged him to engage in regular exercise appropriate for his age and condition.    I,Makayla C Reid,acting as a scribe for Ronal JINNY Hailstone, MD.,have documented all relevant documentation on the behalf of Ronal JINNY Hailstone, MD,as directed by  Ronal JINNY Hailstone, MD while in the presence of Ronal JINNY Hailstone, MD.    I, Ronal JINNY Hailstone, MD, have reviewed all documentation for and agree with the above Annual Wellness Visit documentation.  Ronal JINNY Hailstone, MD Internal Medicine 06/15/2024

## 2024-06-16 LAB — CBC WITH DIFFERENTIAL/PLATELET
Absolute Lymphocytes: 554 {cells}/uL — ABNORMAL LOW (ref 850–3900)
Absolute Monocytes: 394 {cells}/uL (ref 200–950)
Basophils Absolute: 31 {cells}/uL (ref 0–200)
Basophils Relative: 0.8 %
Eosinophils Absolute: 51 {cells}/uL (ref 15–500)
Eosinophils Relative: 1.3 %
HCT: 31.6 % — ABNORMAL LOW (ref 38.5–50.0)
Hemoglobin: 10 g/dL — ABNORMAL LOW (ref 13.2–17.1)
MCH: 31.3 pg (ref 27.0–33.0)
MCHC: 31.6 g/dL — ABNORMAL LOW (ref 32.0–36.0)
MCV: 99.1 fL (ref 80.0–100.0)
MPV: 10.1 fL (ref 7.5–12.5)
Monocytes Relative: 10.1 %
Neutro Abs: 2870 {cells}/uL (ref 1500–7800)
Neutrophils Relative %: 73.6 %
Platelets: 137 Thousand/uL — ABNORMAL LOW (ref 140–400)
RBC: 3.19 Million/uL — ABNORMAL LOW (ref 4.20–5.80)
RDW: 16 % — ABNORMAL HIGH (ref 11.0–15.0)
Total Lymphocyte: 14.2 %
WBC: 3.9 Thousand/uL (ref 3.8–10.8)

## 2024-06-16 LAB — TEST AUTHORIZATION

## 2024-06-16 LAB — COMPREHENSIVE METABOLIC PANEL WITH GFR
AG Ratio: 1.9 (calc) (ref 1.0–2.5)
ALT: 30 U/L (ref 9–46)
AST: 21 U/L (ref 10–35)
Albumin: 3.7 g/dL (ref 3.6–5.1)
Alkaline phosphatase (APISO): 53 U/L (ref 35–144)
BUN: 21 mg/dL (ref 7–25)
CO2: 29 mmol/L (ref 20–32)
Calcium: 8.8 mg/dL (ref 8.6–10.3)
Chloride: 109 mmol/L (ref 98–110)
Creat: 1.01 mg/dL (ref 0.70–1.28)
Globulin: 1.9 g/dL (ref 1.9–3.7)
Glucose, Bld: 93 mg/dL (ref 65–99)
Potassium: 3.8 mmol/L (ref 3.5–5.3)
Sodium: 148 mmol/L — ABNORMAL HIGH (ref 135–146)
Total Bilirubin: 0.4 mg/dL (ref 0.2–1.2)
Total Protein: 5.6 g/dL — ABNORMAL LOW (ref 6.1–8.1)
eGFR: 79 mL/min/1.73m2 (ref >=60)

## 2024-06-16 LAB — LIPID PANEL
Cholesterol: 142 mg/dL (ref <200)
HDL: 74 mg/dL (ref >=40)
LDL Cholesterol (Calc): 46 mg/dL
Non-HDL Cholesterol (Calc): 68 mg/dL (ref <130)
Total CHOL/HDL Ratio: 1.9 (calc) (ref <5.0)
Triglycerides: 139 mg/dL (ref <150)

## 2024-06-16 LAB — IRON,TIBC AND FERRITIN PANEL
%SAT: 78 % — ABNORMAL HIGH (ref 20–48)
Ferritin: 819 ng/mL — ABNORMAL HIGH (ref 24–380)
Iron: 222 ug/dL — ABNORMAL HIGH (ref 50–180)
TIBC: 285 ug/dL (ref 250–425)

## 2024-06-16 LAB — PSA: PSA: <0.04 ng/mL (ref <=4.00)

## 2024-06-16 LAB — B12 AND FOLATE PANEL
Folate: 23.2 ng/mL
Vitamin B-12: 618 pg/mL (ref 200–1100)

## 2024-06-19 ENCOUNTER — Other Ambulatory Visit (HOSPITAL_BASED_OUTPATIENT_CLINIC_OR_DEPARTMENT_OTHER): Payer: Self-pay

## 2024-06-19 MED ORDER — SODIUM FLUORIDE 5000 PPM 1.1 % DT PSTE
PASTE | DENTAL | 5 refills | Status: AC
  Filled 2024-06-19: qty 100, 30d supply, fill #0
  Filled 2024-10-15: qty 100, 30d supply, fill #1

## 2024-06-19 MED ORDER — CHLORHEXIDINE GLUCONATE 0.12 % MT SOLN
OROMUCOSAL | 1 refills | Status: DC
  Filled 2024-06-19: qty 473, 14d supply, fill #0
  Filled 2024-07-06: qty 473, 14d supply, fill #1

## 2024-06-19 MED ORDER — AMOXICILLIN 500 MG PO CAPS: 500.0000 mg | ORAL_CAPSULE | Freq: Three times a day (TID) | ORAL | 0 refills | Status: AC

## 2024-06-22 ENCOUNTER — Other Ambulatory Visit (HOSPITAL_BASED_OUTPATIENT_CLINIC_OR_DEPARTMENT_OTHER): Payer: Self-pay

## 2024-06-22 ENCOUNTER — Other Ambulatory Visit: Payer: Self-pay | Admitting: Internal Medicine

## 2024-06-22 MED ORDER — LEVOTHYROXINE SODIUM 150 MCG PO TABS
150.0000 ug | ORAL_TABLET | Freq: Every day | ORAL | 1 refills | Status: AC
  Filled 2024-06-22: qty 90, 90d supply, fill #0
  Filled 2024-09-08: qty 90, 90d supply, fill #1

## 2024-06-24 ENCOUNTER — Encounter: Payer: Self-pay | Admitting: Internal Medicine

## 2024-06-30 ENCOUNTER — Other Ambulatory Visit (HOSPITAL_BASED_OUTPATIENT_CLINIC_OR_DEPARTMENT_OTHER): Payer: Self-pay

## 2024-07-01 ENCOUNTER — Other Ambulatory Visit: Payer: Self-pay

## 2024-07-01 ENCOUNTER — Other Ambulatory Visit (HOSPITAL_BASED_OUTPATIENT_CLINIC_OR_DEPARTMENT_OTHER): Payer: Self-pay

## 2024-07-01 MED ORDER — PENTOXIFYLLINE ER 400 MG PO TBCR
EXTENDED_RELEASE_TABLET | ORAL | 1 refills | Status: AC
  Filled 2024-07-01: qty 60, 30d supply, fill #0

## 2024-07-01 MED ORDER — PENTOXIFYLLINE ER 400 MG PO TBCR
400.0000 mg | EXTENDED_RELEASE_TABLET | Freq: Two times a day (BID) | ORAL | 1 refills | Status: AC
  Filled 2024-07-01: qty 60, 30d supply, fill #0

## 2024-07-02 ENCOUNTER — Other Ambulatory Visit (HOSPITAL_BASED_OUTPATIENT_CLINIC_OR_DEPARTMENT_OTHER): Payer: Self-pay

## 2024-07-06 ENCOUNTER — Other Ambulatory Visit (HOSPITAL_BASED_OUTPATIENT_CLINIC_OR_DEPARTMENT_OTHER): Payer: Self-pay

## 2024-07-07 ENCOUNTER — Other Ambulatory Visit: Payer: Self-pay

## 2024-07-07 ENCOUNTER — Other Ambulatory Visit (HOSPITAL_BASED_OUTPATIENT_CLINIC_OR_DEPARTMENT_OTHER): Payer: Self-pay

## 2024-07-07 MED ORDER — OXYCODONE HCL 5 MG PO TABS
5.0000 mg | ORAL_TABLET | ORAL | 0 refills | Status: DC | PRN
  Filled 2024-07-07: qty 60, 10d supply, fill #0

## 2024-07-20 ENCOUNTER — Other Ambulatory Visit (HOSPITAL_BASED_OUTPATIENT_CLINIC_OR_DEPARTMENT_OTHER): Payer: Self-pay

## 2024-07-20 MED ORDER — FLUZONE HIGH-DOSE 0.5 ML IM SUSY
0.5000 mL | PREFILLED_SYRINGE | Freq: Once | INTRAMUSCULAR | 0 refills | Status: AC
  Filled 2024-07-20: qty 0.5, 1d supply, fill #0

## 2024-07-27 ENCOUNTER — Other Ambulatory Visit (HOSPITAL_BASED_OUTPATIENT_CLINIC_OR_DEPARTMENT_OTHER): Payer: Self-pay

## 2024-07-27 ENCOUNTER — Other Ambulatory Visit: Payer: Self-pay | Admitting: Internal Medicine

## 2024-07-27 ENCOUNTER — Other Ambulatory Visit: Payer: Self-pay

## 2024-07-27 DIAGNOSIS — K2100 Gastro-esophageal reflux disease with esophagitis, without bleeding: Secondary | ICD-10-CM

## 2024-07-27 MED ORDER — LORAZEPAM 1 MG PO TABS
1.0000 mg | ORAL_TABLET | Freq: Three times a day (TID) | ORAL | 0 refills | Status: AC
  Filled 2024-07-27: qty 225, 90d supply, fill #0

## 2024-07-28 ENCOUNTER — Other Ambulatory Visit (HOSPITAL_BASED_OUTPATIENT_CLINIC_OR_DEPARTMENT_OTHER): Payer: Self-pay

## 2024-07-28 MED ORDER — PANTOPRAZOLE SODIUM 40 MG PO TBEC
40.0000 mg | DELAYED_RELEASE_TABLET | Freq: Every day | ORAL | 3 refills | Status: AC
  Filled 2024-07-28: qty 90, 90d supply, fill #0
  Filled 2024-09-06: qty 90, 90d supply, fill #1

## 2024-07-29 ENCOUNTER — Other Ambulatory Visit (HOSPITAL_BASED_OUTPATIENT_CLINIC_OR_DEPARTMENT_OTHER): Payer: Self-pay

## 2024-07-29 MED ORDER — COMIRNATY 30 MCG/0.3ML IM SUSY
0.3000 mL | PREFILLED_SYRINGE | Freq: Once | INTRAMUSCULAR | 0 refills | Status: AC
  Filled 2024-07-29: qty 0.3, 1d supply, fill #0

## 2024-08-06 ENCOUNTER — Other Ambulatory Visit: Payer: Self-pay

## 2024-08-06 ENCOUNTER — Other Ambulatory Visit (HOSPITAL_BASED_OUTPATIENT_CLINIC_OR_DEPARTMENT_OTHER): Payer: Self-pay

## 2024-08-06 MED ORDER — BUPROPION HCL ER (XL) 150 MG PO TB24
150.0000 mg | ORAL_TABLET | Freq: Every morning | ORAL | 0 refills | Status: DC
  Filled 2024-08-06: qty 60, 60d supply, fill #0

## 2024-08-06 MED ORDER — BUPROPION HCL ER (XL) 300 MG PO TB24
300.0000 mg | ORAL_TABLET | Freq: Every morning | ORAL | 0 refills | Status: AC
  Filled 2024-08-06: qty 60, 60d supply, fill #0

## 2024-08-06 MED ORDER — SERTRALINE HCL 100 MG PO TABS
100.0000 mg | ORAL_TABLET | Freq: Every day | ORAL | 1 refills | Status: AC
  Filled 2024-08-06: qty 60, 60d supply, fill #0

## 2024-08-12 ENCOUNTER — Other Ambulatory Visit: Payer: Self-pay

## 2024-08-12 ENCOUNTER — Other Ambulatory Visit (HOSPITAL_BASED_OUTPATIENT_CLINIC_OR_DEPARTMENT_OTHER): Payer: Self-pay

## 2024-08-12 MED ORDER — AMPHETAMINE-DEXTROAMPHETAMINE 20 MG PO TABS
20.0000 mg | ORAL_TABLET | Freq: Three times a day (TID) | ORAL | 0 refills | Status: AC
  Filled 2024-08-12: qty 270, 90d supply, fill #0

## 2024-08-17 ENCOUNTER — Other Ambulatory Visit (HOSPITAL_COMMUNITY): Payer: Self-pay

## 2024-09-01 ENCOUNTER — Encounter: Payer: Self-pay | Admitting: Cardiovascular Disease

## 2024-09-01 ENCOUNTER — Other Ambulatory Visit (HOSPITAL_BASED_OUTPATIENT_CLINIC_OR_DEPARTMENT_OTHER): Payer: Self-pay

## 2024-09-01 DIAGNOSIS — R002 Palpitations: Secondary | ICD-10-CM

## 2024-09-01 MED ORDER — THIAMINE HCL 100 MG PO TABS
100.0000 mg | ORAL_TABLET | Freq: Every day | ORAL | 0 refills | Status: AC
  Filled 2024-09-01: qty 7, 7d supply, fill #0

## 2024-09-07 ENCOUNTER — Other Ambulatory Visit (HOSPITAL_BASED_OUTPATIENT_CLINIC_OR_DEPARTMENT_OTHER): Payer: Self-pay

## 2024-09-07 ENCOUNTER — Other Ambulatory Visit: Payer: Self-pay

## 2024-09-14 ENCOUNTER — Other Ambulatory Visit (HOSPITAL_BASED_OUTPATIENT_CLINIC_OR_DEPARTMENT_OTHER): Payer: Self-pay

## 2024-09-14 MED ORDER — DEXAMETHASONE 4 MG PO TABS
4.0000 mg | ORAL_TABLET | Freq: Two times a day (BID) | ORAL | 1 refills | Status: AC
  Filled 2024-09-14: qty 36, 18d supply, fill #0
  Filled 2024-10-15: qty 36, 18d supply, fill #1

## 2024-09-14 MED ORDER — OXYCODONE HCL 5 MG PO TABS
5.0000 mg | ORAL_TABLET | ORAL | 0 refills | Status: DC | PRN
  Filled 2024-09-14: qty 60, 10d supply, fill #0

## 2024-09-28 ENCOUNTER — Other Ambulatory Visit (HOSPITAL_BASED_OUTPATIENT_CLINIC_OR_DEPARTMENT_OTHER): Payer: Self-pay

## 2024-09-28 MED ORDER — CHLORHEXIDINE GLUCONATE 0.12 % MT SOLN
15.0000 mL | Freq: Two times a day (BID) | OROMUCOSAL | 0 refills | Status: AC
  Filled 2024-09-28: qty 473, 16d supply, fill #0

## 2024-09-29 ENCOUNTER — Other Ambulatory Visit (HOSPITAL_BASED_OUTPATIENT_CLINIC_OR_DEPARTMENT_OTHER): Payer: Self-pay

## 2024-09-30 ENCOUNTER — Other Ambulatory Visit (HOSPITAL_BASED_OUTPATIENT_CLINIC_OR_DEPARTMENT_OTHER): Payer: Self-pay

## 2024-09-30 MED ORDER — OXYCODONE HCL 5 MG PO TABS
5.0000 mg | ORAL_TABLET | ORAL | 0 refills | Status: DC | PRN
  Filled 2024-09-30: qty 60, 10d supply, fill #0

## 2024-10-02 ENCOUNTER — Other Ambulatory Visit (HOSPITAL_BASED_OUTPATIENT_CLINIC_OR_DEPARTMENT_OTHER): Payer: Self-pay

## 2024-10-02 MED ORDER — CYCLOBENZAPRINE HCL 5 MG PO TABS
5.0000 mg | ORAL_TABLET | Freq: Three times a day (TID) | ORAL | 0 refills | Status: AC | PRN
  Filled 2024-10-02: qty 30, 10d supply, fill #0

## 2024-10-09 ENCOUNTER — Other Ambulatory Visit (HOSPITAL_BASED_OUTPATIENT_CLINIC_OR_DEPARTMENT_OTHER): Payer: Self-pay

## 2024-10-09 MED ORDER — ONDANSETRON 4 MG PO TBDP
4.0000 mg | ORAL_TABLET | Freq: Three times a day (TID) | ORAL | 5 refills | Status: AC | PRN
  Filled 2024-10-09: qty 20, 7d supply, fill #0

## 2024-10-12 ENCOUNTER — Telehealth: Payer: Self-pay | Admitting: Internal Medicine

## 2024-10-12 NOTE — Telephone Encounter (Signed)
 Done

## 2024-10-14 ENCOUNTER — Ambulatory Visit

## 2024-10-14 DIAGNOSIS — R002 Palpitations: Secondary | ICD-10-CM

## 2024-10-15 ENCOUNTER — Other Ambulatory Visit (HOSPITAL_BASED_OUTPATIENT_CLINIC_OR_DEPARTMENT_OTHER): Payer: Self-pay

## 2024-10-15 ENCOUNTER — Other Ambulatory Visit: Payer: Self-pay

## 2024-10-16 ENCOUNTER — Ambulatory Visit: Payer: Self-pay | Admitting: Cardiovascular Disease

## 2024-10-16 DIAGNOSIS — R002 Palpitations: Secondary | ICD-10-CM

## 2024-10-22 ENCOUNTER — Other Ambulatory Visit (HOSPITAL_BASED_OUTPATIENT_CLINIC_OR_DEPARTMENT_OTHER): Payer: Self-pay

## 2024-10-22 MED ORDER — OXYCODONE HCL 5 MG/5ML PO SOLN
5.0000 mg | ORAL | 0 refills | Status: AC
  Filled 2024-10-22: qty 75, 3d supply, fill #0

## 2024-10-22 MED ORDER — AMOXICILLIN 250 MG/5ML PO SUSR
500.0000 mg | Freq: Three times a day (TID) | ORAL | 0 refills | Status: AC
  Filled 2024-10-22: qty 300, 10d supply, fill #0

## 2024-10-23 ENCOUNTER — Other Ambulatory Visit (HOSPITAL_BASED_OUTPATIENT_CLINIC_OR_DEPARTMENT_OTHER): Payer: Self-pay

## 2024-10-29 ENCOUNTER — Other Ambulatory Visit (HOSPITAL_BASED_OUTPATIENT_CLINIC_OR_DEPARTMENT_OTHER): Payer: Self-pay

## 2024-10-29 ENCOUNTER — Ambulatory Visit: Payer: Self-pay

## 2024-10-29 MED ORDER — OXYCODONE HCL 5 MG PO TABS
5.0000 mg | ORAL_TABLET | ORAL | 0 refills | Status: AC | PRN
  Filled 2024-10-29: qty 60, 10d supply, fill #0

## 2024-10-29 NOTE — Telephone Encounter (Signed)
 Wife back. [place]'s bp at this time is 126/80. Has no neuro symptoms.

## 2024-10-29 NOTE — Telephone Encounter (Signed)
 Called patient's wife she said as soon as patient got home he went to take a nap and did not check his pressure.  She going to wake him up to check his pressure and she will call back with readings.

## 2024-10-29 NOTE — Telephone Encounter (Signed)
 FYI Only or Action Required?: Action required by provider: update on patient condition and is calling Cards provider .  Patient was last seen in primary care on 06/15/2024 by Gregory Ronal PARAS, MD.  Called Nurse Triage reporting Hypertension.  Symptoms began no symptoms .  Interventions attempted: Rest, hydration, or home remedies.  Symptoms are: stable.  Triage Disposition: See HCP Within 4 Hours (Or PCP Triage)  Patient/caregiver understands and will follow disposition?: No, wishes to speak with PCP  Copied from CRM #[id number]. Topic: Clinical - Medical Advice >> Oct 29, 2024  9:24 AM [name] wrote: FYI: Dr. Eluterio at [facility], calling to report the pt BP reading was 200/100 x 3. Pt is asymptomatic and came in for a eyelid procedure and she just wanted to go the extra mile to inform his pcp.  Cuite, Dorothyann Perkins, MD- [phone] Reason for Disposition  [1] Systolic BP >= 200 OR Diastolic >= 120 AND [2] having NO cardiac or neurologic symptoms  Answer Assessment - Initial Assessment Questions Patient seen in Opthalmo office today for an eyelid procedure. Call from provider to report BP in 200's/100's.  Spoke with wife and pt- Pt was very nervous, he drove to the appt in traffic and ice, and pain from the procedure. His final BP in office was 208/93- denies any CP, SOB, Fever, Dizziness, vision changes, unilateral weakness, paresthesias, or confusion.   Has cardiology appt on Monday. Advised to reach out to Cards provider to ask if they want him to do anything in the interim. Patient is on his way home from procedure now. Will check his BP at home when he settles and can relax from stress of travel.   Advised ED if BP is sustaining and any symptoms develop. Wife aware and understands ED precautions    1. BLOOD PRESSURE: What is your blood pressure? Did you take at least two measurements 5 minutes apart?     208/93 was last  2. ONSET: When did you  take your blood pressure?     Arm cuff in providers office  3. HOW: How did you take your blood pressure? (e.g., automatic home BP monitor, visiting nurse)     Arm cuff in MD office 4. HISTORY: Do you have a history of high blood pressure?     HTN  5. MEDICINES: Are you taking any medicines for blood pressure? Have you missed any doses recently?     denies 6. OTHER SYMPTOMS: Do you have any symptoms? (e.g., blurred vision, chest pain, difficulty breathing, headache, weakness)     denies  Protocols used: Blood Pressure - High-A-AH

## 2024-11-02 ENCOUNTER — Ambulatory Visit: Admitting: Cardiovascular Disease

## 2024-11-04 ENCOUNTER — Other Ambulatory Visit (HOSPITAL_BASED_OUTPATIENT_CLINIC_OR_DEPARTMENT_OTHER): Payer: Self-pay

## 2024-11-04 ENCOUNTER — Other Ambulatory Visit: Payer: Self-pay

## 2024-11-04 MED ORDER — NYSTATIN 100000 UNIT/ML MT SUSP
5.0000 mL | Freq: Four times a day (QID) | OROMUCOSAL | 1 refills | Status: AC
  Filled 2024-11-04 (×2): qty 473, 24d supply, fill #0

## 2024-12-07 ENCOUNTER — Ambulatory Visit: Admitting: Cardiovascular Disease
